# Patient Record
Sex: Male | Born: 1965 | Race: White | Hispanic: No | Smoking: Current every day smoker
Health system: Southern US, Community
[De-identification: ages and names within clinical notes are randomized; demographics above are authoritative.]

---

## 2021-01-05 ENCOUNTER — Inpatient Hospital Stay (HOSPITAL_COMMUNITY)
Admission: EM | Admit: 2021-01-05 | Discharge: 2021-05-14 | DRG: 871 | Disposition: A | Discharge status: Skilled Nursing Facility | Payer: Medicaid Other | Attending: Internal Medicine | Admitting: Internal Medicine

## 2021-01-05 ENCOUNTER — Emergency Department (HOSPITAL_COMMUNITY): Payer: Medicaid Other

## 2021-01-05 ENCOUNTER — Inpatient Hospital Stay (HOSPITAL_COMMUNITY): Payer: Medicaid Other

## 2021-01-05 DIAGNOSIS — E8770 Fluid overload, unspecified: Secondary | ICD-10-CM | POA: Diagnosis present

## 2021-01-05 DIAGNOSIS — E872 Acidosis, unspecified: Secondary | ICD-10-CM | POA: Diagnosis present

## 2021-01-05 DIAGNOSIS — R4587 Impulsiveness: Secondary | ICD-10-CM | POA: Diagnosis present

## 2021-01-05 DIAGNOSIS — R0902 Hypoxemia: Secondary | ICD-10-CM | POA: Diagnosis present

## 2021-01-05 DIAGNOSIS — R609 Edema, unspecified: Secondary | ICD-10-CM | POA: Diagnosis not present

## 2021-01-05 DIAGNOSIS — K7200 Acute and subacute hepatic failure without coma: Secondary | ICD-10-CM | POA: Diagnosis present

## 2021-01-05 DIAGNOSIS — G930 Cerebral cysts: Secondary | ICD-10-CM | POA: Diagnosis present

## 2021-01-05 DIAGNOSIS — M5124 Other intervertebral disc displacement, thoracic region: Secondary | ICD-10-CM | POA: Diagnosis present

## 2021-01-05 DIAGNOSIS — J189 Pneumonia, unspecified organism: Secondary | ICD-10-CM | POA: Diagnosis not present

## 2021-01-05 DIAGNOSIS — K5900 Constipation, unspecified: Secondary | ICD-10-CM | POA: Diagnosis present

## 2021-01-05 DIAGNOSIS — F1027 Alcohol dependence with alcohol-induced persisting dementia: Secondary | ICD-10-CM | POA: Diagnosis present

## 2021-01-05 DIAGNOSIS — Z20822 Contact with and (suspected) exposure to covid-19: Secondary | ICD-10-CM | POA: Diagnosis present

## 2021-01-05 DIAGNOSIS — L989 Disorder of the skin and subcutaneous tissue, unspecified: Secondary | ICD-10-CM

## 2021-01-05 DIAGNOSIS — F419 Anxiety disorder, unspecified: Secondary | ICD-10-CM | POA: Diagnosis present

## 2021-01-05 DIAGNOSIS — G2581 Restless legs syndrome: Secondary | ICD-10-CM | POA: Diagnosis present

## 2021-01-05 DIAGNOSIS — M79672 Pain in left foot: Secondary | ICD-10-CM | POA: Diagnosis not present

## 2021-01-05 DIAGNOSIS — R569 Unspecified convulsions: Secondary | ICD-10-CM | POA: Diagnosis present

## 2021-01-05 DIAGNOSIS — J690 Pneumonitis due to inhalation of food and vomit: Secondary | ICD-10-CM | POA: Diagnosis present

## 2021-01-05 DIAGNOSIS — M6282 Rhabdomyolysis: Secondary | ICD-10-CM | POA: Diagnosis present

## 2021-01-05 DIAGNOSIS — R109 Unspecified abdominal pain: Secondary | ICD-10-CM

## 2021-01-05 DIAGNOSIS — G9349 Other encephalopathy: Secondary | ICD-10-CM | POA: Diagnosis not present

## 2021-01-05 DIAGNOSIS — G5760 Lesion of plantar nerve, unspecified lower limb: Secondary | ICD-10-CM | POA: Diagnosis present

## 2021-01-05 DIAGNOSIS — E512 Wernicke's encephalopathy: Secondary | ICD-10-CM | POA: Diagnosis present

## 2021-01-05 DIAGNOSIS — R6520 Severe sepsis without septic shock: Secondary | ICD-10-CM

## 2021-01-05 DIAGNOSIS — G928 Other toxic encephalopathy: Secondary | ICD-10-CM | POA: Diagnosis present

## 2021-01-05 DIAGNOSIS — D509 Iron deficiency anemia, unspecified: Secondary | ICD-10-CM | POA: Diagnosis not present

## 2021-01-05 DIAGNOSIS — F1097 Alcohol use, unspecified with alcohol-induced persisting dementia: Secondary | ICD-10-CM | POA: Diagnosis not present

## 2021-01-05 DIAGNOSIS — Z8673 Personal history of transient ischemic attack (TIA), and cerebral infarction without residual deficits: Secondary | ICD-10-CM

## 2021-01-05 DIAGNOSIS — Z765 Malingerer [conscious simulation]: Secondary | ICD-10-CM

## 2021-01-05 DIAGNOSIS — Z91199 Patient's noncompliance with other medical treatment and regimen due to unspecified reason: Secondary | ICD-10-CM

## 2021-01-05 DIAGNOSIS — R52 Pain, unspecified: Secondary | ICD-10-CM

## 2021-01-05 DIAGNOSIS — G008 Other bacterial meningitis: Secondary | ICD-10-CM | POA: Diagnosis not present

## 2021-01-05 DIAGNOSIS — I248 Other forms of acute ischemic heart disease: Secondary | ICD-10-CM | POA: Diagnosis present

## 2021-01-05 DIAGNOSIS — R4189 Other symptoms and signs involving cognitive functions and awareness: Secondary | ICD-10-CM | POA: Diagnosis present

## 2021-01-05 DIAGNOSIS — A419 Sepsis, unspecified organism: Secondary | ICD-10-CM | POA: Diagnosis present

## 2021-01-05 DIAGNOSIS — R262 Difficulty in walking, not elsewhere classified: Secondary | ICD-10-CM

## 2021-01-05 DIAGNOSIS — L8990 Pressure ulcer of unspecified site, unspecified stage: Secondary | ICD-10-CM | POA: Diagnosis present

## 2021-01-05 DIAGNOSIS — L309 Dermatitis, unspecified: Secondary | ICD-10-CM | POA: Diagnosis not present

## 2021-01-05 DIAGNOSIS — Z751 Person awaiting admission to adequate facility elsewhere: Secondary | ICD-10-CM

## 2021-01-05 DIAGNOSIS — N179 Acute kidney failure, unspecified: Secondary | ICD-10-CM

## 2021-01-05 DIAGNOSIS — E8721 Acute metabolic acidosis: Secondary | ICD-10-CM | POA: Diagnosis present

## 2021-01-05 DIAGNOSIS — F418 Other specified anxiety disorders: Secondary | ICD-10-CM | POA: Diagnosis not present

## 2021-01-05 DIAGNOSIS — L89891 Pressure ulcer of other site, stage 1: Secondary | ICD-10-CM | POA: Diagnosis present

## 2021-01-05 DIAGNOSIS — Z5900 Homelessness unspecified: Secondary | ICD-10-CM

## 2021-01-05 DIAGNOSIS — F329 Major depressive disorder, single episode, unspecified: Secondary | ICD-10-CM | POA: Diagnosis present

## 2021-01-05 DIAGNOSIS — E860 Dehydration: Secondary | ICD-10-CM | POA: Diagnosis present

## 2021-01-05 DIAGNOSIS — Z6822 Body mass index (BMI) 22.0-22.9, adult: Secondary | ICD-10-CM

## 2021-01-05 DIAGNOSIS — R7401 Elevation of levels of liver transaminase levels: Secondary | ICD-10-CM | POA: Diagnosis not present

## 2021-01-05 DIAGNOSIS — N170 Acute kidney failure with tubular necrosis: Secondary | ICD-10-CM | POA: Diagnosis present

## 2021-01-05 DIAGNOSIS — L509 Urticaria, unspecified: Secondary | ICD-10-CM | POA: Diagnosis present

## 2021-01-05 DIAGNOSIS — L308 Other specified dermatitis: Secondary | ICD-10-CM | POA: Diagnosis not present

## 2021-01-05 DIAGNOSIS — R5381 Other malaise: Secondary | ICD-10-CM | POA: Diagnosis not present

## 2021-01-05 DIAGNOSIS — H9190 Unspecified hearing loss, unspecified ear: Secondary | ICD-10-CM | POA: Diagnosis present

## 2021-01-05 DIAGNOSIS — G629 Polyneuropathy, unspecified: Secondary | ICD-10-CM | POA: Diagnosis present

## 2021-01-05 DIAGNOSIS — I10 Essential (primary) hypertension: Secondary | ICD-10-CM | POA: Diagnosis present

## 2021-01-05 DIAGNOSIS — Z79899 Other long term (current) drug therapy: Secondary | ICD-10-CM

## 2021-01-05 DIAGNOSIS — R9431 Abnormal electrocardiogram [ECG] [EKG]: Secondary | ICD-10-CM | POA: Diagnosis not present

## 2021-01-05 DIAGNOSIS — E440 Moderate protein-calorie malnutrition: Secondary | ICD-10-CM | POA: Diagnosis present

## 2021-01-05 DIAGNOSIS — K56609 Unspecified intestinal obstruction, unspecified as to partial versus complete obstruction: Secondary | ICD-10-CM

## 2021-01-05 DIAGNOSIS — R633 Feeding difficulties, unspecified: Secondary | ICD-10-CM | POA: Diagnosis not present

## 2021-01-05 DIAGNOSIS — F05 Delirium due to known physiological condition: Secondary | ICD-10-CM | POA: Diagnosis present

## 2021-01-05 DIAGNOSIS — Q2112 Patent foramen ovale: Secondary | ICD-10-CM | POA: Diagnosis not present

## 2021-01-05 DIAGNOSIS — G9341 Metabolic encephalopathy: Secondary | ICD-10-CM | POA: Insufficient documentation

## 2021-01-05 DIAGNOSIS — E875 Hyperkalemia: Secondary | ICD-10-CM | POA: Diagnosis present

## 2021-01-05 DIAGNOSIS — L299 Pruritus, unspecified: Secondary | ICD-10-CM | POA: Diagnosis not present

## 2021-01-05 DIAGNOSIS — M25472 Effusion, left ankle: Secondary | ICD-10-CM | POA: Diagnosis present

## 2021-01-05 DIAGNOSIS — R410 Disorientation, unspecified: Secondary | ICD-10-CM

## 2021-01-05 DIAGNOSIS — G4700 Insomnia, unspecified: Secondary | ICD-10-CM | POA: Diagnosis not present

## 2021-01-05 DIAGNOSIS — E876 Hypokalemia: Secondary | ICD-10-CM | POA: Diagnosis not present

## 2021-01-05 DIAGNOSIS — M7989 Other specified soft tissue disorders: Secondary | ICD-10-CM | POA: Diagnosis present

## 2021-01-05 DIAGNOSIS — L03221 Cellulitis of neck: Secondary | ICD-10-CM | POA: Diagnosis not present

## 2021-01-05 DIAGNOSIS — I519 Heart disease, unspecified: Secondary | ICD-10-CM | POA: Diagnosis not present

## 2021-01-05 DIAGNOSIS — N19 Unspecified kidney failure: Secondary | ICD-10-CM | POA: Diagnosis not present

## 2021-01-05 DIAGNOSIS — R823 Hemoglobinuria: Secondary | ICD-10-CM | POA: Diagnosis present

## 2021-01-05 DIAGNOSIS — M109 Gout, unspecified: Secondary | ICD-10-CM | POA: Diagnosis present

## 2021-01-05 DIAGNOSIS — M5125 Other intervertebral disc displacement, thoracolumbar region: Secondary | ICD-10-CM | POA: Diagnosis present

## 2021-01-05 DIAGNOSIS — L209 Atopic dermatitis, unspecified: Secondary | ICD-10-CM | POA: Diagnosis present

## 2021-01-05 DIAGNOSIS — Z23 Encounter for immunization: Secondary | ICD-10-CM | POA: Diagnosis not present

## 2021-01-05 DIAGNOSIS — R310 Gross hematuria: Secondary | ICD-10-CM | POA: Diagnosis not present

## 2021-01-05 DIAGNOSIS — Z781 Physical restraint status: Secondary | ICD-10-CM

## 2021-01-05 DIAGNOSIS — M25562 Pain in left knee: Secondary | ICD-10-CM | POA: Diagnosis present

## 2021-01-05 DIAGNOSIS — I071 Rheumatic tricuspid insufficiency: Secondary | ICD-10-CM | POA: Diagnosis present

## 2021-01-05 DIAGNOSIS — R451 Restlessness and agitation: Secondary | ICD-10-CM | POA: Diagnosis not present

## 2021-01-05 DIAGNOSIS — D649 Anemia, unspecified: Secondary | ICD-10-CM | POA: Diagnosis present

## 2021-01-05 DIAGNOSIS — R509 Fever, unspecified: Secondary | ICD-10-CM | POA: Diagnosis not present

## 2021-01-05 DIAGNOSIS — M79662 Pain in left lower leg: Secondary | ICD-10-CM | POA: Diagnosis not present

## 2021-01-05 DIAGNOSIS — F17210 Nicotine dependence, cigarettes, uncomplicated: Secondary | ICD-10-CM | POA: Diagnosis present

## 2021-01-05 DIAGNOSIS — E46 Unspecified protein-calorie malnutrition: Secondary | ICD-10-CM

## 2021-01-05 DIAGNOSIS — I6389 Other cerebral infarction: Secondary | ICD-10-CM | POA: Diagnosis present

## 2021-01-05 DIAGNOSIS — F1096 Alcohol use, unspecified with alcohol-induced persisting amnestic disorder: Secondary | ICD-10-CM | POA: Diagnosis not present

## 2021-01-05 DIAGNOSIS — M25572 Pain in left ankle and joints of left foot: Secondary | ICD-10-CM | POA: Diagnosis present

## 2021-01-05 DIAGNOSIS — H539 Unspecified visual disturbance: Secondary | ICD-10-CM | POA: Diagnosis present

## 2021-01-05 DIAGNOSIS — G259 Extrapyramidal and movement disorder, unspecified: Secondary | ICD-10-CM | POA: Diagnosis present

## 2021-01-05 DIAGNOSIS — I824Y9 Acute embolism and thrombosis of unspecified deep veins of unspecified proximal lower extremity: Secondary | ICD-10-CM | POA: Diagnosis not present

## 2021-01-05 DIAGNOSIS — F940 Selective mutism: Secondary | ICD-10-CM | POA: Diagnosis not present

## 2021-01-05 DIAGNOSIS — L0211 Cutaneous abscess of neck: Secondary | ICD-10-CM | POA: Diagnosis not present

## 2021-01-05 DIAGNOSIS — I82409 Acute embolism and thrombosis of unspecified deep veins of unspecified lower extremity: Secondary | ICD-10-CM

## 2021-01-05 DIAGNOSIS — Z452 Encounter for adjustment and management of vascular access device: Secondary | ICD-10-CM

## 2021-01-05 LAB — CBC
HCT: 46.8 % (ref 39.0–52.0)
HCT: 41.1 % (ref 39.0–52.0)
Hemoglobin: 15.1 g/dL (ref 13.0–17.0)
Hemoglobin: 13.6 g/dL (ref 13.0–17.0)
MCH: 32.2 pg (ref 26.0–34.0)
MCH: 32.9 pg (ref 26.0–34.0)
MCHC: 32.3 g/dL (ref 30.0–36.0)
MCHC: 33.1 g/dL (ref 30.0–36.0)
MCV: 99.8 fL (ref 80.0–100.0)
MCV: 99.3 fL (ref 80.0–100.0)
Platelets: 382 10*3/uL (ref 150–400)
Platelets: 321 10*3/uL (ref 150–400)
RBC: 4.69 MIL/uL (ref 4.22–5.81)
RBC: 4.14 MIL/uL — ABNORMAL LOW (ref 4.22–5.81)
RDW: 15.3 % (ref 11.5–15.5)
RDW: 15.2 % (ref 11.5–15.5)
WBC: 23.7 10*3/uL — ABNORMAL HIGH (ref 4.0–10.5)
WBC: 20.3 10*3/uL — ABNORMAL HIGH (ref 4.0–10.5)
nRBC: 0 % (ref 0.0–0.2)
nRBC: 0 % (ref 0.0–0.2)

## 2021-01-05 LAB — GLUCOSE, CAPILLARY
Glucose-Capillary: 131 mg/dL — ABNORMAL HIGH (ref 70–99)
Glucose-Capillary: 119 mg/dL — ABNORMAL HIGH (ref 70–99)
Glucose-Capillary: 136 mg/dL — ABNORMAL HIGH (ref 70–99)

## 2021-01-05 LAB — URINALYSIS, ROUTINE W REFLEX MICROSCOPIC
Bilirubin Urine: NEGATIVE
Glucose, UA: NEGATIVE mg/dL
Ketones, ur: NEGATIVE mg/dL
Leukocytes,Ua: NEGATIVE
Nitrite: NEGATIVE
Protein, ur: 100 mg/dL — AB
Specific Gravity, Urine: 1.01 (ref 1.005–1.030)
pH: 5 (ref 5.0–8.0)

## 2021-01-05 LAB — RAPID URINE DRUG SCREEN, HOSP PERFORMED
Amphetamines: NOT DETECTED
Barbiturates: NOT DETECTED
Benzodiazepines: POSITIVE — AB
Cocaine: NOT DETECTED
Opiates: NOT DETECTED
Tetrahydrocannabinol: NOT DETECTED

## 2021-01-05 LAB — COMPREHENSIVE METABOLIC PANEL
ALT: 893 U/L — ABNORMAL HIGH (ref 0–44)
AST: 1267 U/L — ABNORMAL HIGH (ref 15–41)
Albumin: 4.1 g/dL (ref 3.5–5.0)
Alkaline Phosphatase: 90 U/L (ref 38–126)
Anion gap: 19 — ABNORMAL HIGH (ref 5–15)
BUN: 32 mg/dL — ABNORMAL HIGH (ref 6–20)
CO2: 19 mmol/L — ABNORMAL LOW (ref 22–32)
Calcium: 8 mg/dL — ABNORMAL LOW (ref 8.9–10.3)
Chloride: 103 mmol/L (ref 98–111)
Creatinine, Ser: 3.27 mg/dL — ABNORMAL HIGH (ref 0.61–1.24)
GFR, Estimated: 21 mL/min — ABNORMAL LOW (ref >60)
Glucose, Bld: 122 mg/dL — ABNORMAL HIGH (ref 70–99)
Potassium: 4.8 mmol/L (ref 3.5–5.1)
Sodium: 141 mmol/L (ref 135–145)
Total Bilirubin: 1 mg/dL (ref 0.3–1.2)
Total Protein: 8 g/dL (ref 6.5–8.1)

## 2021-01-05 LAB — CBG MONITORING, ED: Glucose-Capillary: 87 mg/dL (ref 70–99)

## 2021-01-05 LAB — CREATININE, SERUM
Creatinine, Ser: 3.04 mg/dL — ABNORMAL HIGH (ref 0.61–1.24)
GFR, Estimated: 23 mL/min — ABNORMAL LOW (ref >60)

## 2021-01-05 LAB — RESP PANEL BY RT-PCR (FLU A&B, COVID) ARPGX2
Influenza A by PCR: NEGATIVE
Influenza B by PCR: NEGATIVE
SARS Coronavirus 2 by RT PCR: NEGATIVE

## 2021-01-05 LAB — APTT: aPTT: 27 seconds (ref 24–36)

## 2021-01-05 LAB — CK: Total CK: >50000 U/L — ABNORMAL HIGH (ref 49–397)

## 2021-01-05 LAB — HIV ANTIBODY (ROUTINE TESTING W REFLEX): HIV Screen 4th Generation wRfx: NONREACTIVE

## 2021-01-05 LAB — LACTIC ACID, PLASMA
Lactic Acid, Venous: 7.6 mmol/L (ref 0.5–1.9)
Lactic Acid, Venous: 2.8 mmol/L (ref 0.5–1.9)
Lactic Acid, Venous: 4 mmol/L (ref 0.5–1.9)
Lactic Acid, Venous: 2.4 mmol/L (ref 0.5–1.9)

## 2021-01-05 LAB — MRSA NEXT GEN BY PCR, NASAL: MRSA by PCR Next Gen: NOT DETECTED

## 2021-01-05 LAB — PROTIME-INR
INR: 1.2 (ref 0.8–1.2)
Prothrombin Time: 15.5 seconds — ABNORMAL HIGH (ref 11.4–15.2)

## 2021-01-05 LAB — TROPONIN I (HIGH SENSITIVITY)
Troponin I (High Sensitivity): 1823 ng/L (ref <18)
Troponin I (High Sensitivity): 2359 ng/L (ref <18)

## 2021-01-05 LAB — ETHANOL: Alcohol, Ethyl (B): <10 mg/dL (ref <10)

## 2021-01-05 IMAGING — DX DG CHEST 1V PORT
1 series · 1 of 1 positions shown · non-contrast
Comparison: None.

CLINICAL DATA: Questionable sepsis

EXAM:
PORTABLE CHEST 1 VIEW

[chest]
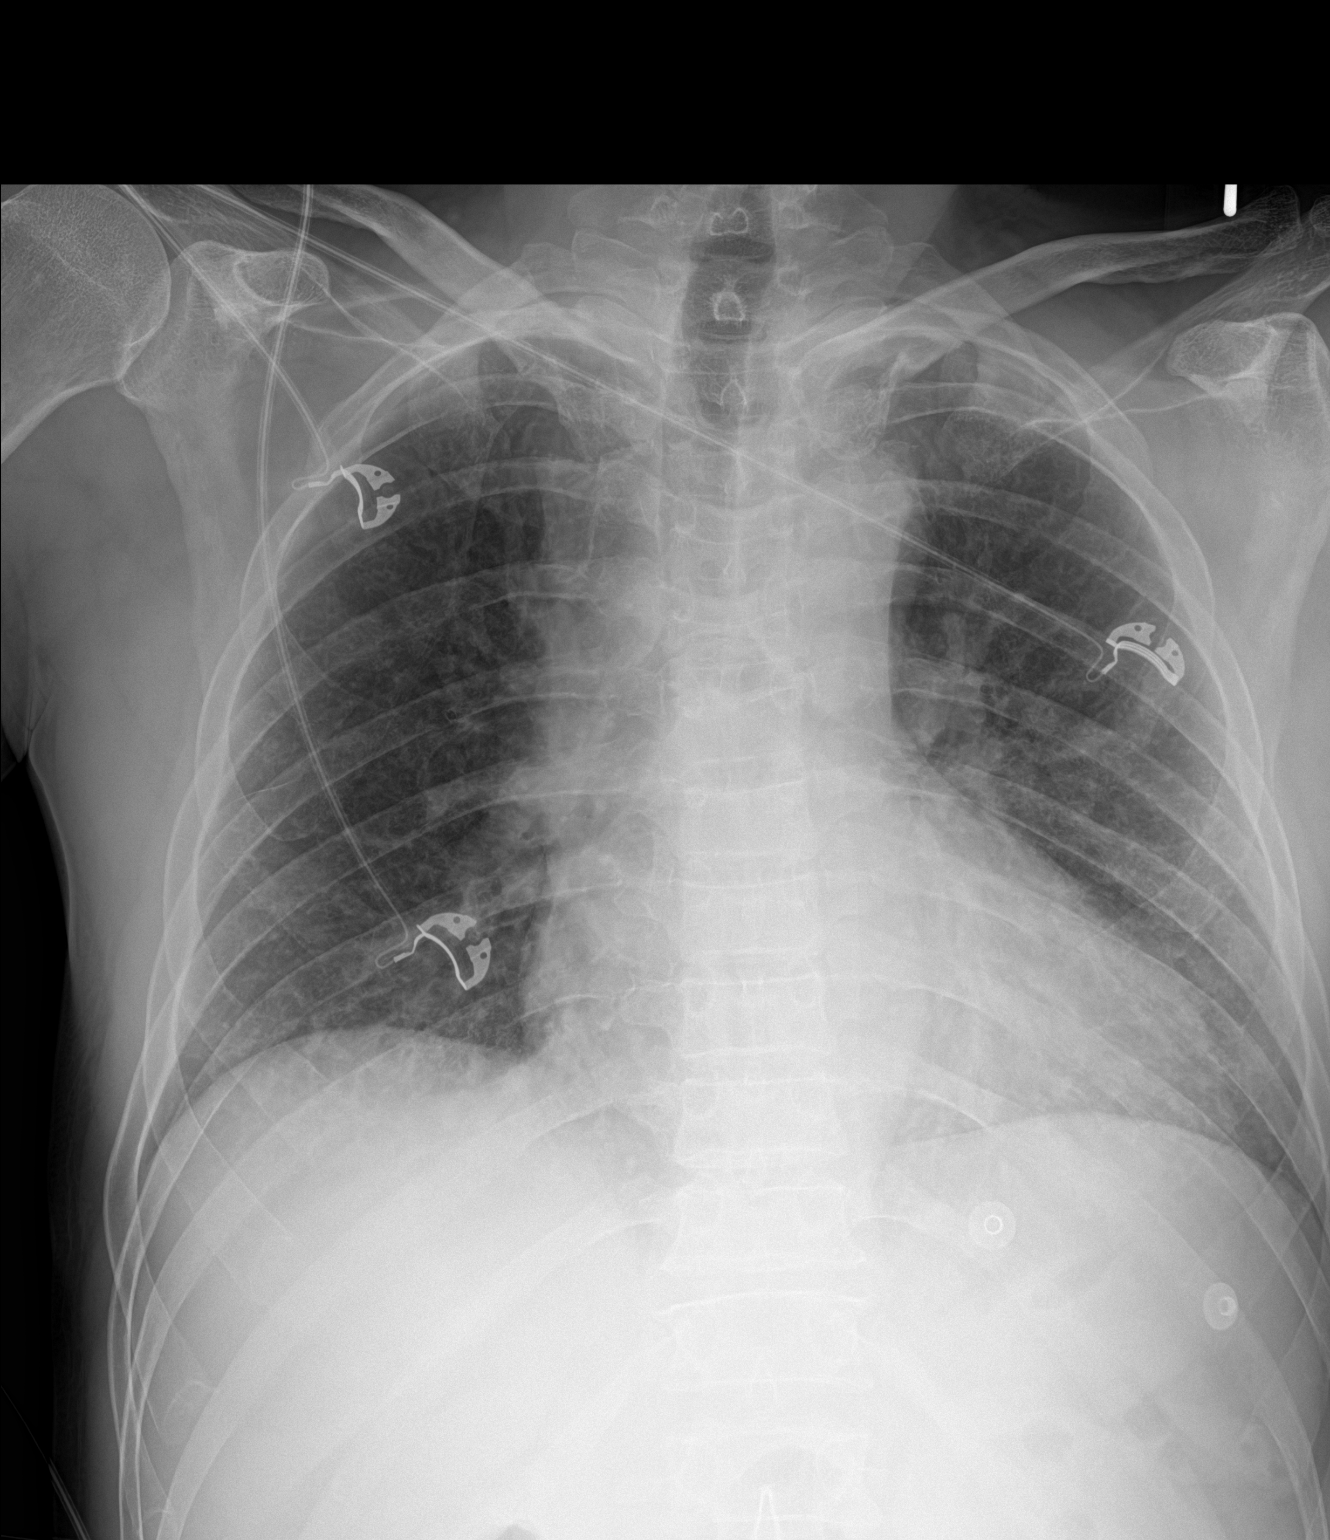

[1 of 1 positions shown; findings below may reference images not displayed]

FINDINGS: Normal heart size. Normal mediastinal contour. No pneumothorax. No
pleural effusion. No overt pulmonary edema. Mild hazy bibasilar lung
opacities.
IMPRESSION: Mild hazy bibasilar lung opacities, which could represent aspiration
or atelectasis.

## 2021-01-05 IMAGING — CT CT HEAD W/O CM
3 series · 15 of 47 positions shown, 18 images · non-contrast
Comparison: None.

CLINICAL DATA: Mental status change, unknown cause

EXAM:
CT HEAD WITHOUT CONTRAST
TECHNIQUE: Contiguous axial images were obtained from the base of the skull
through the vertex without intravenous contrast.

[Series 3: head 5.0 h30s · axial · 0.45mm/px · z∈[-176,-26]mm · 9 of 36 slices shown, 12 images]
[im 3/36  brain]
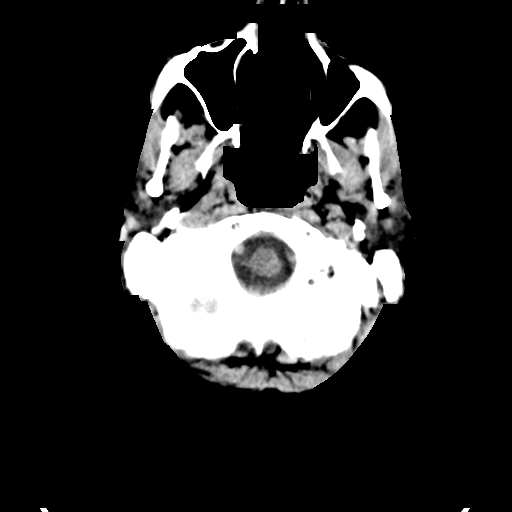
[im 3/36  bone]
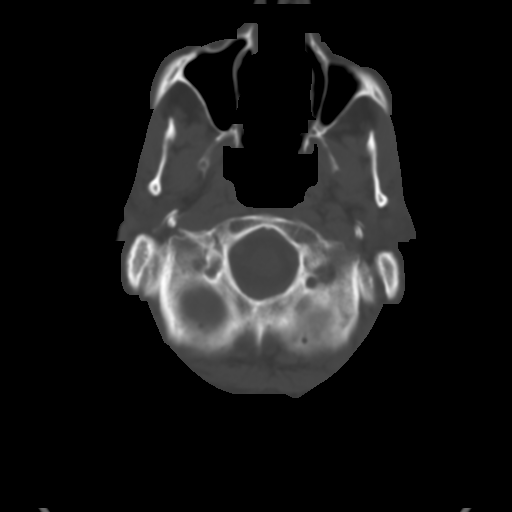
[im 7/36  brain]
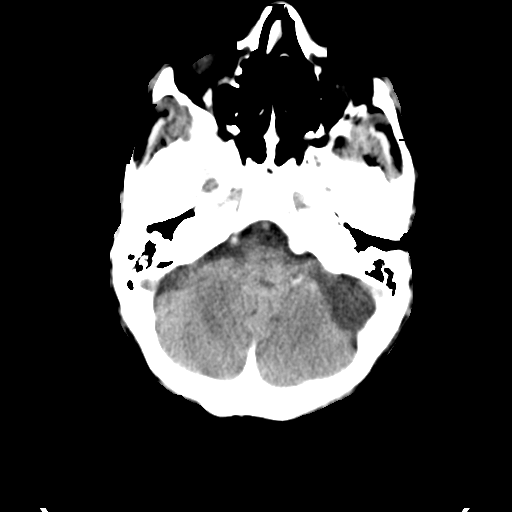
[im 10/36  brain]
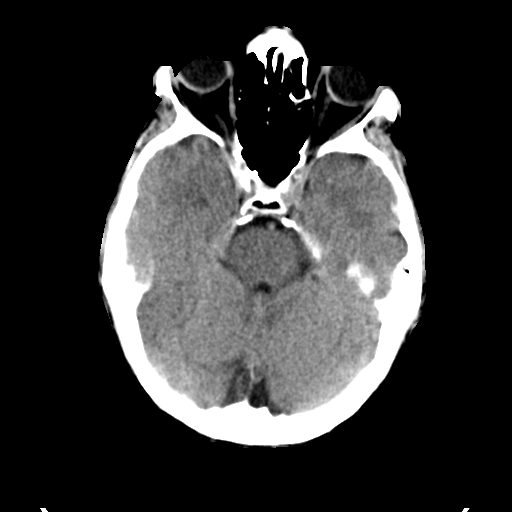
[im 14/36  brain]
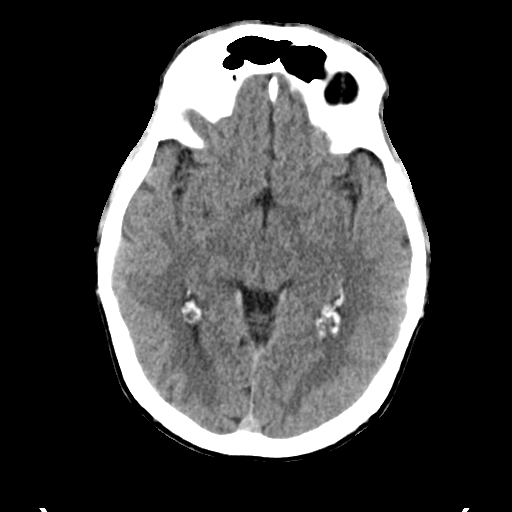
[im 19/36  brain]
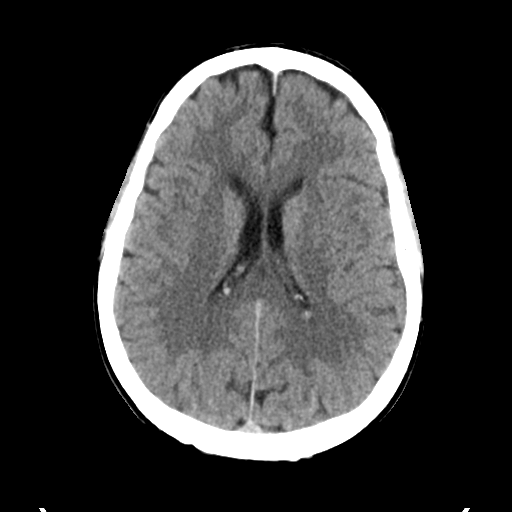
[im 19/36  bone]
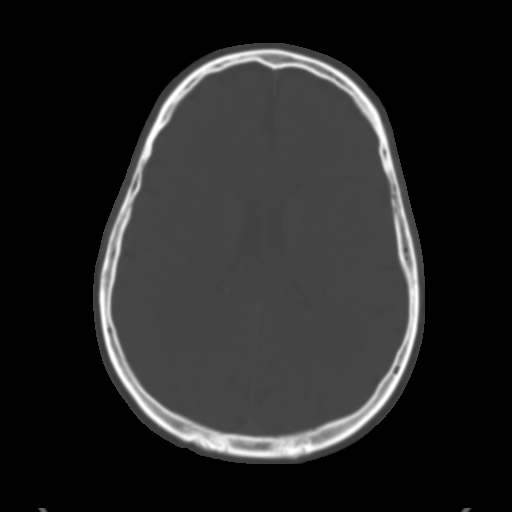
[im 22/36  brain]
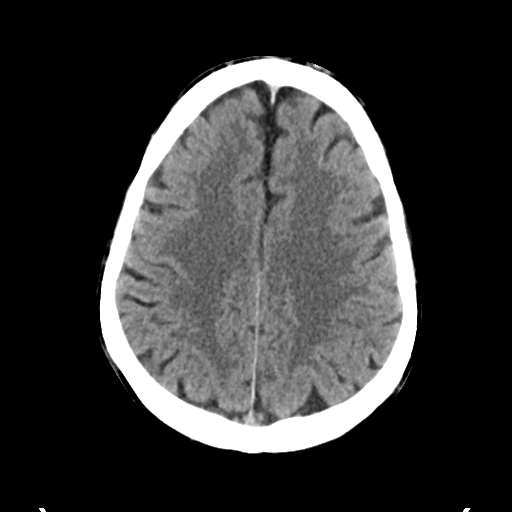
[im 26/36  brain]
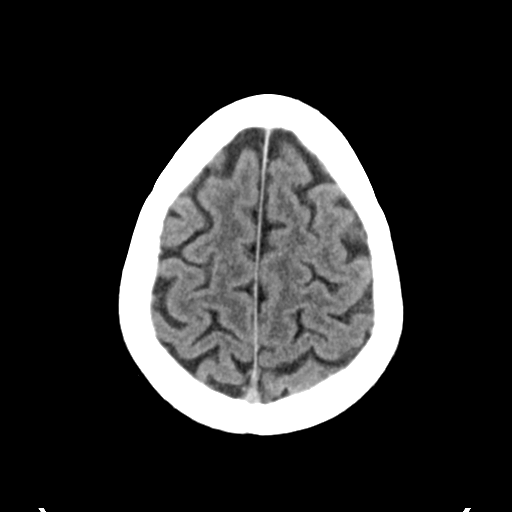
[im 29/36  brain]
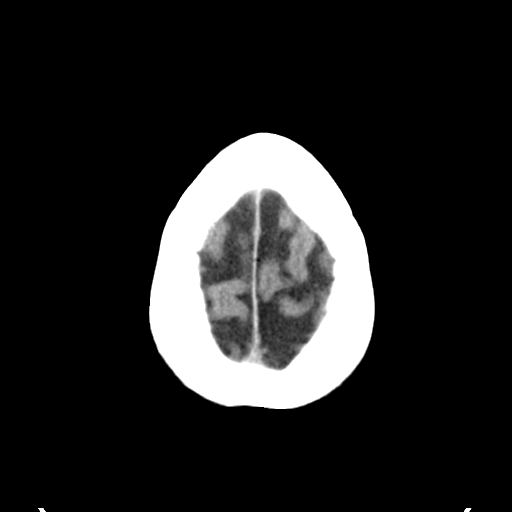
[im 33/36  brain]
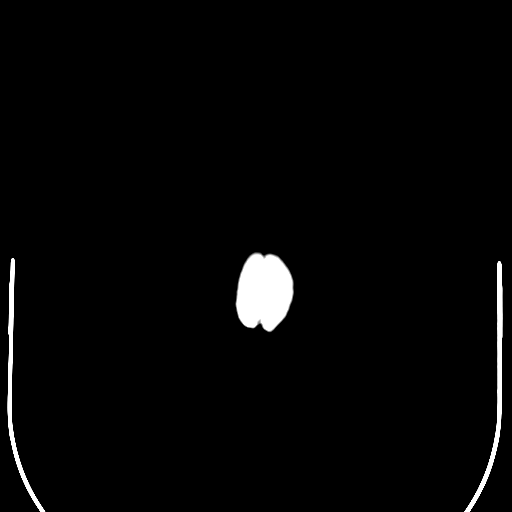
[im 33/36  bone]
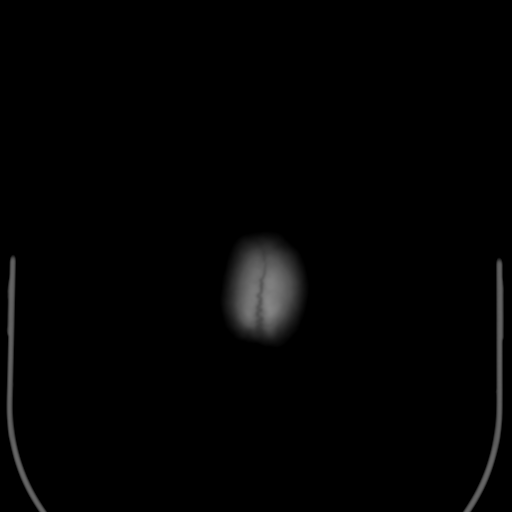

[Series 5: head 3.0 mpr cor · coronal · 0.37mm/px · 3 of 70 slices shown]
[im 24/70  brain]
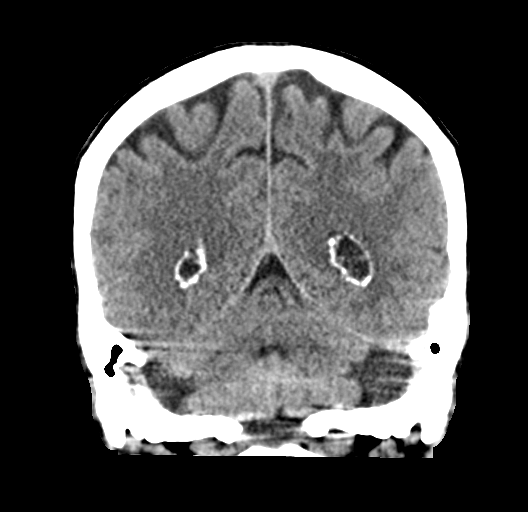
[im 31/70  brain]
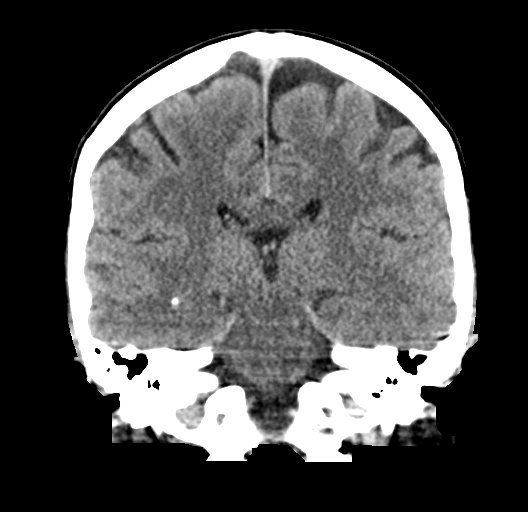
[im 39/70  brain]
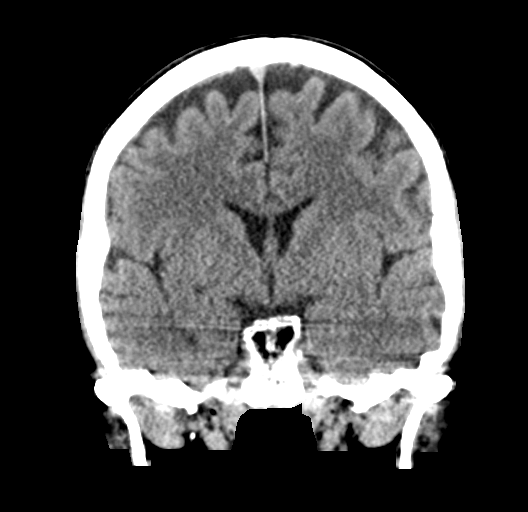

[Series 6: head 3.0 mpr sag · sagittal · 0.34mm/px · 3 of 62 slices shown]
[im 21/62  brain]
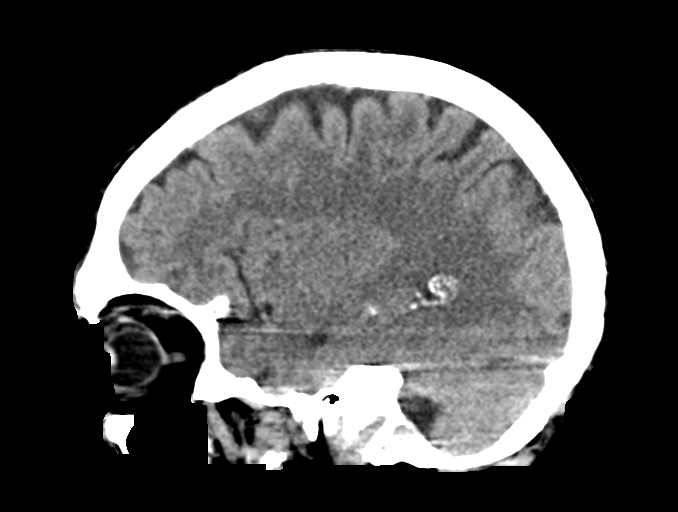
[im 31/62  brain]
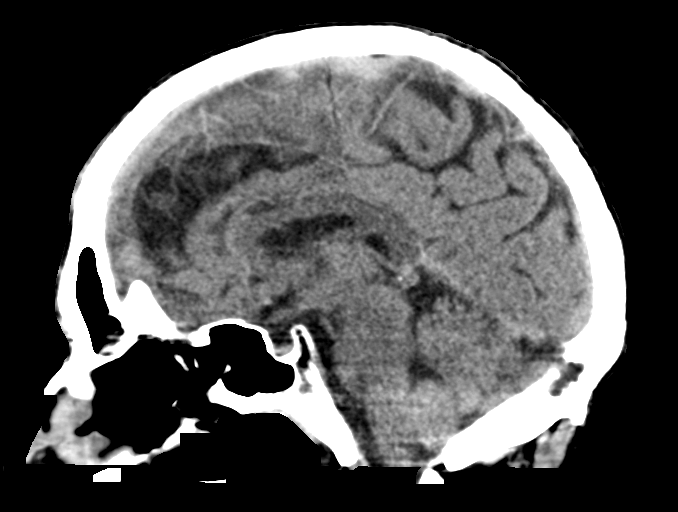
[im 41/62  brain]
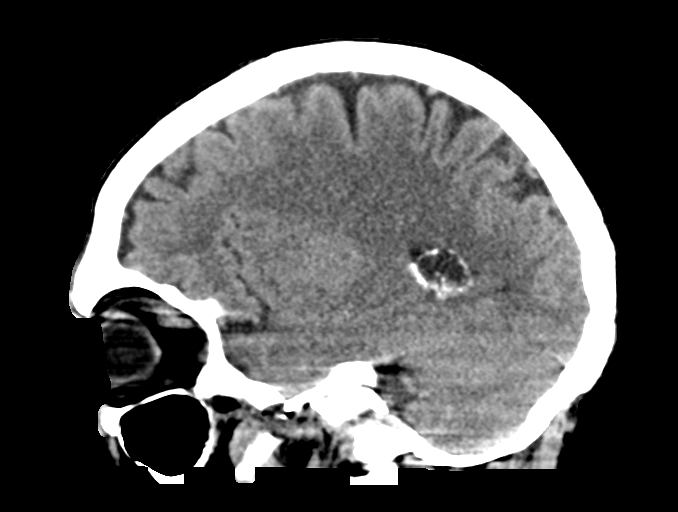

[15 of 47 positions shown; findings below may reference images not displayed]

FINDINGS: Brain: No evidence of acute infarction, hemorrhage, or
hydrocephalus. Prominent fluid space at the left cerebellopontine
angle measuring approximately 3 x 2 x 2 cm which appears to have
mild mass effect upon the adjacent cerebellar hemisphere. Space
appears to have internal density near that of CSF, although
evaluation is slightly limited given artifact from the adjacent
skull base.

Vascular: No hyperdense vessel or unexpected calcification.

Skull: Normal. Negative for fracture or focal lesion.

Sinuses/Orbits: No acute finding.

Other: None.
IMPRESSION: 1. No acute intracranial findings.
2. Prominent fluid space at the left cerebellopontine angle, likely
representing an arachnoid cyst or possibly epidermoid cyst.
Correlation with prior outside imaging of the brain is recommended
to assess stability. If no prior imaging is available, a follow-up
contrast enhanced MRI could be performed on a nonemergent basis.

## 2021-01-05 MED ORDER — SODIUM CHLORIDE 0.9 % IV SOLN: 1250.0000 mg | Freq: Two times a day (BID) | INTRAVENOUS | Status: DC

## 2021-01-05 MED ORDER — HEPARIN SODIUM (PORCINE) 5000 UNIT/ML IJ SOLN
5000.0000 [IU] | Freq: Three times a day (TID) | INTRAMUSCULAR | Status: DC
  Administered 2021-01-05 – 2021-01-13 (×25): 5000 [IU] via SUBCUTANEOUS
  Filled 2021-01-05 (×25): qty 1

## 2021-01-05 MED ORDER — FENTANYL CITRATE (PF) 100 MCG/2ML IJ SOLN
12.5000 ug | Freq: Once | INTRAMUSCULAR | Status: AC
  Administered 2021-01-05: 12.5 ug via INTRAVENOUS
  Filled 2021-01-05: qty 2

## 2021-01-05 MED ORDER — POLYETHYLENE GLYCOL 3350 17 G PO PACK
17.0000 g | PACK | Freq: Every day | ORAL | Status: DC | PRN
  Administered 2021-01-29 – 2021-02-28 (×2): 17 g via ORAL
  Filled 2021-01-05: qty 1

## 2021-01-05 MED ORDER — SODIUM CHLORIDE 0.9 % IV SOLN
20.0000 mg/kg | Freq: Two times a day (BID) | INTRAVENOUS | Status: DC
  Filled 2021-01-05: qty 13.5

## 2021-01-05 MED ORDER — SODIUM CHLORIDE 0.9 % IV BOLUS
1000.0000 mL | Freq: Once | INTRAVENOUS | Status: AC
  Administered 2021-01-05: 14:00:00 1000 mL via INTRAVENOUS

## 2021-01-05 MED ORDER — SODIUM CHLORIDE 0.9 % IV SOLN
2000.0000 mg | Freq: Once | INTRAVENOUS | Status: AC
  Administered 2021-01-05: 18:00:00 2000 mg via INTRAVENOUS
  Filled 2021-01-05: qty 20

## 2021-01-05 MED ORDER — MIDAZOLAM HCL 2 MG/2ML IJ SOLN
INTRAMUSCULAR | Status: AC
  Filled 2021-01-05: qty 6

## 2021-01-05 MED ORDER — DIPHENHYDRAMINE HCL 50 MG/ML IJ SOLN
12.5000 mg | Freq: Four times a day (QID) | INTRAMUSCULAR | Status: DC | PRN
  Administered 2021-01-05 – 2021-01-14 (×4): 12.5 mg via INTRAVENOUS
  Filled 2021-01-05 (×4): qty 1

## 2021-01-05 MED ORDER — VANCOMYCIN HCL IN DEXTROSE 1-5 GM/200ML-% IV SOLN
1000.0000 mg | Freq: Once | INTRAVENOUS | Status: AC
  Administered 2021-01-05: 16:00:00 1000 mg via INTRAVENOUS
  Filled 2021-01-05: qty 200

## 2021-01-05 MED ORDER — LACTATED RINGERS IV BOLUS
1000.0000 mL | Freq: Once | INTRAVENOUS | Status: AC
  Administered 2021-01-05: 16:00:00 1000 mL via INTRAVENOUS

## 2021-01-05 MED ORDER — DOCUSATE SODIUM 100 MG PO CAPS
100.0000 mg | ORAL_CAPSULE | Freq: Two times a day (BID) | ORAL | Status: DC | PRN
  Administered 2021-01-13 – 2021-03-02 (×2): 100 mg via ORAL
  Filled 2021-01-05: qty 1

## 2021-01-05 MED ORDER — SODIUM CHLORIDE 0.9 % IV SOLN
3.0000 g | Freq: Two times a day (BID) | INTRAVENOUS | Status: DC
  Administered 2021-01-05 – 2021-01-06 (×2): 3 g via INTRAVENOUS
  Filled 2021-01-05 (×2): qty 8

## 2021-01-05 MED ORDER — LACTATED RINGERS IV SOLN
INTRAVENOUS | Status: DC
Start: 2021-01-05 — End: 2021-01-08

## 2021-01-05 MED ORDER — MIDAZOLAM HCL 5 MG/5ML IJ SOLN
INTRAMUSCULAR | Status: DC | PRN
  Administered 2021-01-05: 5 mg via INTRAVENOUS

## 2021-01-05 MED ORDER — LEVETIRACETAM IN NACL 500 MG/100ML IV SOLN
500.0000 mg | Freq: Two times a day (BID) | INTRAVENOUS | Status: DC
  Filled 2021-01-05: qty 100

## 2021-01-05 MED ORDER — SODIUM CHLORIDE 0.9 % IV BOLUS
1000.0000 mL | Freq: Once | INTRAVENOUS | Status: AC
  Administered 2021-01-05: 15:00:00 1000 mL via INTRAVENOUS

## 2021-01-05 MED ORDER — LORAZEPAM 2 MG/ML IJ SOLN
1.0000 mg | Freq: Once | INTRAMUSCULAR | Status: AC
  Administered 2021-01-05: 17:00:00 1 mg via INTRAVENOUS
  Filled 2021-01-05: qty 1

## 2021-01-05 MED ORDER — SODIUM CHLORIDE 0.9 % IV SOLN
2.0000 g | Freq: Once | INTRAVENOUS | Status: AC
  Administered 2021-01-05: 16:00:00 2 g via INTRAVENOUS
  Filled 2021-01-05: qty 2

## 2021-01-05 MED ORDER — SODIUM CHLORIDE 0.9 % IV SOLN
20.0000 mg/kg | Freq: Two times a day (BID) | INTRAVENOUS | Status: DC
  Filled 2021-01-05 (×2): qty 13.5

## 2021-01-05 MED ORDER — LORAZEPAM 2 MG/ML IJ SOLN
1.0000 mg | Freq: Once | INTRAMUSCULAR | Status: AC
  Administered 2021-01-05: 18:00:00 1 mg via INTRAVENOUS

## 2021-01-05 MED ORDER — VANCOMYCIN VARIABLE DOSE PER UNSTABLE RENAL FUNCTION (PHARMACIST DOSING): Status: DC

## 2021-01-05 MED ORDER — LEVETIRACETAM IN NACL 500 MG/100ML IV SOLN
500.0000 mg | Freq: Two times a day (BID) | INTRAVENOUS | Status: DC
  Administered 2021-01-06 – 2021-01-08 (×5): 500 mg via INTRAVENOUS
  Filled 2021-01-05 (×5): qty 100

## 2021-01-05 MED ORDER — SODIUM CHLORIDE 0.9 % IV SOLN: 2.0000 g | INTRAVENOUS | Status: DC

## 2021-01-05 MED ORDER — DEXMEDETOMIDINE HCL IN NACL 400 MCG/100ML IV SOLN
0.4000 ug/kg/h | INTRAVENOUS | Status: DC
  Administered 2021-01-05: 17:00:00 1 ug/kg/h via INTRAVENOUS
  Administered 2021-01-06: 10:00:00 0.8 ug/kg/h via INTRAVENOUS
  Administered 2021-01-06 (×2): 0.9 ug/kg/h via INTRAVENOUS
  Administered 2021-01-06: 01:00:00 0.7 ug/kg/h via INTRAVENOUS
  Administered 2021-01-07: 06:00:00 1 ug/kg/h via INTRAVENOUS
  Administered 2021-01-07 (×3): 1.2 ug/kg/h via INTRAVENOUS
  Administered 2021-01-08: 03:00:00 0.8 ug/kg/h via INTRAVENOUS
  Administered 2021-01-08: 21:00:00 0.4 ug/kg/h via INTRAVENOUS
  Administered 2021-01-09: 04:00:00 0.9 ug/kg/h via INTRAVENOUS
  Administered 2021-01-09: 20:00:00 0.6 ug/kg/h via INTRAVENOUS
  Administered 2021-01-10: 20:00:00 0.7 ug/kg/h via INTRAVENOUS
  Administered 2021-01-10: 03:00:00 0.8 ug/kg/h via INTRAVENOUS
  Administered 2021-01-10 – 2021-01-11 (×2): 0.7 ug/kg/h via INTRAVENOUS
  Administered 2021-01-11 – 2021-01-12 (×3): 0.6 ug/kg/h via INTRAVENOUS
  Administered 2021-01-13: 10:00:00 1 ug/kg/h via INTRAVENOUS
  Administered 2021-01-13: 06:00:00 1.4 ug/kg/h via INTRAVENOUS
  Administered 2021-01-13: 16:00:00 0.9 ug/kg/h via INTRAVENOUS
  Administered 2021-01-13: 0.6 ug/kg/h via INTRAVENOUS
  Administered 2021-01-14: 08:00:00 1.1 ug/kg/h via INTRAVENOUS
  Administered 2021-01-14: 04:00:00 1.3 ug/kg/h via INTRAVENOUS
  Administered 2021-01-14: 19:00:00 0.6 ug/kg/h via INTRAVENOUS
  Administered 2021-01-15: 02:00:00 1 ug/kg/h via INTRAVENOUS
  Filled 2021-01-05 (×17): qty 100
  Filled 2021-01-05: qty 200
  Filled 2021-01-05 (×9): qty 100

## 2021-01-05 NOTE — H&P (Addendum)
NAME:  Steve [name], MRN:  [id number], DOB:  Oct 25, 1965, LOS: 0 ADMISSION DATE:  01/05/2021, CONSULTATION DATE:  12/20 REFERRING MD:  12/20, CHIEF COMPLAINT:  sepsis and AMS   History of Present Illness:  This is a 55 year old white male with limited medical history on presentation to the emergency room.  He was brought from EMS after being found at a group home.  Reportedly his roommate found him having full body convulsions, followed by altered sensorium and combativeness.  Per report from EMS roommate had been noticing the patient was not acting typical for the prior few days before. On arrival to ER was found to be slightly hypotensive with systolic blood pressure in the 90s, room air saturations low 90s, he was confused, and at times combative.  A CT head was obtained this was negative for acute bleeding but did show prominent fluid space at the left cerebral pontine angle raising possibility of epidermoid or arachnoid cyst. Initial lab work: Serum creatinine of 3.27, abnormal LFTs, white blood cell count 23.7, and lactic acid 7.6.  Blood cultures were sent, empiric antibiotic started, IV fluid resuscitation administered.  Because of multiple metabolic derangements, altered sensorium, and concern about seizure the critical care team was asked to admit.  Pertinent  Medical History  Formal history not available on time of admission.  Home medications notable for Prozac, Neurontin, hydroxyzine and trazodone Significant Hospital Events: Including procedures, antibiotic start and stop dates in addition to other pertinent events     Interim History / Subjective:  Mental status slowly improving  Objective   Blood pressure (Abnormal) 102/58, pulse 96, temperature (Abnormal) 100.8 F (38.2 C), temperature source Rectal, resp. rate (Abnormal) 23, SpO2 96 %.        Intake/Output Summary (Last 24 hours) at 01/05/2021 1534 Last data filed at 01/05/2021 1432 Gross per 24 hour  Intake 1000 ml   Output no documentation  Net 1000 ml   There were no vitals filed for this visit.  Examination: General: 55 year old white male easily arousable, follows commands.  No focal motor deficits and currently in no distress but very hard of hearing HENT: Normocephalic atraumatic however does have bruising on his tongue consistent with having bitten it.  Pupils equal reactive seems very hard of hearing Lungs: Clear to auscultation no accessory use Cardiovascular: Tachycardic regular rhythm Abdomen: Soft not tender Extremities: Warm dry with multiple abrasions on the left lower extremity Neuro: Awake oriented x2 very hard of hearing moves all extremities, there is no nuchal rigidity GU: Due to void,  Resolved Hospital Problem list     Assessment & Plan:  Acute metabolic encephalopathy.  Highest on differential diagnosis at this point would be seizure with postictal state, cannot exclude infection but suspect this would be secondary.  Polysubstance abuse history unknown Plan Follow UDS  loading with Keppra EEG Serial neuro checks ICU admit Neuro consult. 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   Plan May need further MRI imaging Not clear if this could be contributing to seizure, seems unlikely  Lactic acidosis with anion gap metabolic acidosis -More inclined to think secondary to seizure -May be element of sepsis Plan Repeating third liter of LR Repeat lactate  Severe sepsis secondary to aspiration pneumonia Plan Continuing IV fluid challenge Admit to ICU Repeat post resuscitation lactate Blood culture sent IV vancomycin and cefepime A.m. CBC  Aspiration pneumonia Plan Antibiotics as above Supplemental oxygen Pulse oximetry N.p.o.  AKI Plan IV  hydration Renal dose meds Strict intake output Serial chemistries  Urticaria He is constantly scratching at LLE Plan Add low dose benadryl     Best  Practice (right click and "Reselect all SmartList Selections" daily)   Diet/type: NPO DVT prophylaxis: prophylactic heparin  GI prophylaxis: N/A Lines: N/A Foley:  N/A Code Status:  full code Last date of multidisciplinary goals of care discussion [pending ]  Labs   CBC: Recent Labs  Lab 01/05/21 1342  WBC 23.7*  HGB 15.1  HCT 46.8  MCV 99.8  PLT 382    Basic Metabolic Panel: No results for input(s): NA, K, CL, CO2, GLUCOSE, BUN, CREATININE, CALCIUM, MG, PHOS in the last 168 hours. GFR: CrCl cannot be calculated (No successful lab value found.). Recent Labs  Lab 01/05/21 1342 01/05/21 1441  WBC 23.7*  --   LATICACIDVEN  --  7.6*    Liver Function Tests: No results for input(s): AST, ALT, ALKPHOS, BILITOT, PROT, ALBUMIN in the last 168 hours. No results for input(s): LIPASE, AMYLASE in the last 168 hours. No results for input(s): AMMONIA in the last 168 hours.  ABG No results found for: PHART, PCO2ART, PO2ART, HCO3, TCO2, ACIDBASEDEF, O2SAT   Coagulation Profile: Recent Labs  Lab 01/05/21 1342  INR 1.2    Cardiac Enzymes: No results for input(s): CKTOTAL, CKMB, CKMBINDEX, TROPONINI in the last 168 hours.  HbA1C: No results found for: HGBA1C  CBG: Recent Labs  Lab 01/05/21 1414  GLUCAP 87    Review of Systems:   Unable to obtain ROS  Past Medical History:  He,  has no past medical history on file.  On record Surgical History:  Not able to provide   Social History:     Group home  Family History:  His family history is not on file.   Allergies Not on File   Home Medications  Prior to Admission medications   Medication Sig Start Date End Date Taking? Authorizing Provider  FLUoxetine (PROZAC) 20 MG capsule Take 40 mg by mouth daily. 12/27/20   [provider]  gabapentin (NEURONTIN) 400 MG capsule Take 400 mg by mouth 3 (three) times daily. 11/27/20   [provider]  hydrOXYzine (ATARAX) 50 MG tablet Take 50 mg by  mouth every 4 (four) hours as needed. 11/27/20   [provider]  traZODone (DESYREL) 50 MG tablet Take 50 mg by mouth at bedtime as needed. 11/13/20   [provider]     Critical care time: 32 min    Simonne Martinet ACNP-BC [facility] Pulmonary/Critical Care Pager # [phone] OR # [phone] if no answer

## 2021-01-05 NOTE — Progress Notes (Deleted)
EEG done at bedside. No skin breakdown seen. Results pending.

## 2021-01-05 NOTE — Progress Notes (Addendum)
EEG unable to be preformed due patient moving around constantly. Will try back at a later time.

## 2021-01-05 NOTE — Plan of Care (Signed)

## 2021-01-05 NOTE — Progress Notes (Signed)
Pharmacy Antibiotic Note  Steve [name] is a 55 y.o. male admitted on 01/05/2021 with pneumonia.  Pharmacy has been consulted for unasyn dosing.  Plan: Unasyn 3 grams iv q12h  Weight: 67.5 kg (148 lb 13 oz)  Temp (24hrs), Avg:100.8 F (38.2 C), Min:100.8 F (38.2 C), Max:100.8 F (38.2 C)  Recent Labs  Lab 01/05/21 1342 01/05/21 1441 01/05/21 1538  WBC 23.7*  --  20.3*  CREATININE 3.27*  --  3.04*  LATICACIDVEN  --  7.6* 2.8*    CrCl cannot be calculated (Unknown ideal weight.).    Not on File  Antimicrobials this admission: 12/20 cefepime >> 12/20 12/20 vancomycin >> 12/20  Dose adjustments this admission: Unasyn for renal function  Microbiology results: 12/20 BCx: in progress 12/20 UCx: w/ reflex in progress    Thank you for allowing pharmacy to be a part of this patients care.  Greta Doom BS, PharmD, BCPS Clinical Pharmacist 01/05/2021 5:40 PM

## 2021-01-05 NOTE — ED Provider Notes (Signed)
[facility] EMERGENCY DEPARTMENT Provider Note   CSN: [id number] Arrival date & time: 01/05/21  1314     History Chief Complaint  Patient presents with   Altered Mental Status   Seizures    Steve [name] is a 55 y.o. male presented emergency room via EMS with concern for altered mental status.  Patient presents from a group home, where he was witnessed to be lying on the ground with possible convulsions by his roommates.  It is unclear what his total downtime was.  EMS reports that the patient was combative and nonverbal on their arrival, noncompliant with them.  Given 200 cc of fluid in total in route to the hospital.  His glucose is 127.  On arrival the patient is able to verbalize very little, states that he has a headache.  He is able to tell me his name and his date of birth, not that he understands that he is in the hospital, but cannot follow any other commands or provide any other information.  He repeatedly says no when I ask if he has used any drugs today.  He states no when asked if he has had a seizure before.  Per my review of his medical chart, there is not appear to be any other additional medical information in his record in terms of prior encounters.  PDMP review shows no recent prescriptions filled.  HPI     No past medical history on file.  Patient Active Problem List   Diagnosis Date Noted   Acute metabolic encephalopathy 01/05/2021       No family history on file.     Home Medications Prior to Admission medications   Medication Sig Start Date End Date Taking? Authorizing Provider  FLUoxetine (PROZAC) 20 MG capsule Take 40 mg by mouth daily. 12/27/20   [provider]  gabapentin (NEURONTIN) 400 MG capsule Take 400 mg by mouth 3 (three) times daily. 11/27/20   [provider]  hydrOXYzine (ATARAX) 50 MG tablet Take 50 mg by mouth every 4 (four) hours as needed. 11/27/20   [provider]  traZODone (DESYREL)  50 MG tablet Take 50 mg by mouth at bedtime as needed. 11/13/20   [provider]    Allergies    Patient has no allergy information on record.  Review of Systems   Review of Systems  Unable to perform ROS: Mental status change (level 5 caveat)   Physical Exam Updated Vital Signs BP (!) 120/101    Pulse 70    Temp (!) 100.8 F (38.2 C) (Rectal)    Resp 20    Ht 5\' 8"  (1.727 m)    Wt 67.5 kg    SpO2 96%    BMI 22.63 kg/m   Physical Exam Constitutional:      Comments: Diaphoretic  HENT:     Head: Normocephalic and atraumatic.  Eyes:     Conjunctiva/sclera: Conjunctivae normal.     Pupils: Pupils are equal, round, and reactive to light.  Cardiovascular:     Rate and Rhythm: Regular rhythm. Tachycardia present.  Pulmonary:     Effort: Pulmonary effort is normal. No respiratory distress.  Abdominal:     General: There is no distension.     Tenderness: There is no abdominal tenderness.  Skin:    General: Skin is warm and dry.  Neurological:     Mental Status: He is alert.     GCS: GCS eye subscore is 4. GCS verbal subscore  is 3. GCS motor subscore is 6.     Comments: Moving all extremities Does not follow commands    ED Results / Procedures / Treatments   Labs (all labs ordered are listed, but only abnormal results are displayed) Labs Reviewed  COMPREHENSIVE METABOLIC PANEL - Abnormal; Notable for the following components:      Result Value   CO2 19 (*)    Glucose, Bld 122 (*)    BUN 32 (*)    Creatinine, Ser 3.27 (*)    Calcium 8.0 (*)    AST 1,267 (*)    ALT 893 (*)    GFR, Estimated 21 (*)    Anion gap 19 (*)    All other components within normal limits  CBC - Abnormal; Notable for the following components:   WBC 23.7 (*)    All other components within normal limits  LACTIC ACID, PLASMA - Abnormal; Notable for the following components:   Lactic Acid, Venous 7.6 (*)    All other components within normal limits  PROTIME-INR - Abnormal; Notable for the  following components:   Prothrombin Time 15.5 (*)    All other components within normal limits  CBC - Abnormal; Notable for the following components:   WBC 20.3 (*)    RBC 4.14 (*)    All other components within normal limits  CREATININE, SERUM - Abnormal; Notable for the following components:   Creatinine, Ser 3.04 (*)    GFR, Estimated 23 (*)    All other components within normal limits  LACTIC ACID, PLASMA - Abnormal; Notable for the following components:   Lactic Acid, Venous 2.8 (*)    All other components within normal limits  GLUCOSE, CAPILLARY - Abnormal; Notable for the following components:   Glucose-Capillary 131 (*)    All other components within normal limits  TROPONIN I (HIGH SENSITIVITY) - Abnormal; Notable for the following components:   Troponin I (High Sensitivity) 1,823 (*)    All other components within normal limits  TROPONIN I (HIGH SENSITIVITY) - Abnormal; Notable for the following components:   Troponin I (High Sensitivity) 2,359 (*)    All other components within normal limits  RESP PANEL BY RT-PCR (FLU A&B, COVID) ARPGX2  CULTURE, BLOOD (ROUTINE X 2) W REFLEX TO ID PANEL  CULTURE, BLOOD (ROUTINE X 2) W REFLEX TO ID PANEL  ETHANOL  APTT  HIV ANTIBODY (ROUTINE TESTING W REFLEX)  RAPID URINE DRUG SCREEN, HOSP PERFORMED  URINALYSIS, ROUTINE W REFLEX MICROSCOPIC  LACTIC ACID, PLASMA  LACTIC ACID, PLASMA  CBC  CK  COMPREHENSIVE METABOLIC PANEL  CBG MONITORING, ED    EKG EKG Interpretation  Date/Time:  Tuesday January 05 2021 13:25:49 EST Ventricular Rate:  114 PR Interval:  132 QRS Duration: 108 QT Interval:  322 QTC Calculation: 444 R Axis:   266 Text Interpretation: Sinus tachycardia RSR' in V1 or V2, right VCD or RVH Confirmed by Alvester Chou [phone]) on 01/05/2021 1:47:03 PM  Radiology CT HEAD WO CONTRAST ( )  Result Date: 01/05/2021 CLINICAL DATA:  Mental status change, unknown cause EXAM: CT HEAD WITHOUT CONTRAST TECHNIQUE:  Contiguous axial images were obtained from the base of the skull through the vertex without intravenous contrast. COMPARISON:  None. FINDINGS: Brain: No evidence of acute infarction, hemorrhage, or hydrocephalus. Prominent fluid space at the left cerebellopontine angle measuring approximately 3 x 2 x 2 cm which appears to have mild mass effect upon the adjacent cerebellar hemisphere. Space appears to have internal density near that  of CSF, although evaluation is slightly limited given artifact from the adjacent skull base. Vascular: No hyperdense vessel or unexpected calcification. Skull: Normal. Negative for fracture or focal lesion. Sinuses/Orbits: No acute finding. Other: None. IMPRESSION: 1. No acute intracranial findings. 2. 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 Correlation with prior outside imaging of the brain is recommended to assess stability. If no prior imaging is available, a follow-up contrast enhanced MRI could be performed on a nonemergent basis. Electronically Signed   By: Duanne Guess D.O.   On: 01/05/2021 14:39   DG Chest Port 1 View  Result Date: 01/05/2021 CLINICAL DATA:  Questionable sepsis EXAM: PORTABLE CHEST 1 VIEW COMPARISON:  None. FINDINGS: Normal heart size. Normal mediastinal contour. No pneumothorax. No pleural effusion. No overt pulmonary edema. Mild hazy bibasilar lung opacities. IMPRESSION: Mild hazy bibasilar lung opacities, which could represent aspiration or atelectasis. Electronically Signed   By: Delbert Phenix M.D.   On: 01/05/2021 14:57    Procedures Procedures   Medications Ordered in ED Medications  midazolam (VERSED) 5 MG/5ML injection ( Intravenous Canceled Entry 01/05/21 1345)  docusate sodium (COLACE) capsule 100 mg (has no administration in time range)  polyethylene glycol (MIRALAX / GLYCOLAX) packet 17 g (has no administration in time range)  heparin injection 5,000  Units (has no administration in time range)  diphenhydrAMINE (BENADRYL) injection 12.5 mg (12.5 mg Intravenous Given 01/05/21 1604)  dexmedetomidine (PRECEDEX) 400 MCG/100ML (4 mcg/mL) infusion (1 mcg/kg/hr  67.5 kg Intravenous New Bag/Given 01/05/21 1715)  levETIRAcetam (KEPPRA) 1,350 mg in sodium chloride 0.9 % 100 mL IVPB (has no administration in time range)  lactated ringers infusion ( Intravenous New Bag/Given 01/05/21 1750)  Ampicillin-Sulbactam (UNASYN) 3 g in sodium chloride 0.9 % 100 mL IVPB (has no administration in time range)  sodium chloride 0.9 % bolus 1,000 mL (0 mLs Intravenous Stopped 01/05/21 1432)  sodium chloride 0.9 % bolus 1,000 mL (1,000 mLs Intravenous New Bag/Given 01/05/21 1448)  ceFEPIme (MAXIPIME) 2 g in sodium chloride 0.9 % 100 mL IVPB (0 g Intravenous Stopped 01/05/21 1648)  vancomycin (VANCOCIN) IVPB 1000 mg/200 mL premix (0 mg Intravenous Stopped 01/05/21 1752)  lactated ringers bolus 1,000 mL (1,000 mLs Intravenous New Bag/Given 01/05/21 1552)  levETIRAcetam (KEPPRA) 2,000 mg in sodium chloride 0.9 % 250 mL IVPB (2,000 mg Intravenous New Bag/Given 01/05/21 1751)  LORazepam (ATIVAN) injection 1 mg (1 mg Intravenous Given 01/05/21 1724)  LORazepam (ATIVAN) injection 1 mg (1 mg Intravenous Given 01/05/21 1730)    ED Course  I have reviewed the triage vital signs and the nursing notes.  Pertinent labs & imaging results that were available during my care of the patient were reviewed by me and considered in my medical decision making (see chart for details).   This patient presents with fever, AMS. This involves an extensive number of treatment options, and is a complaint that carries with it a high risk of complications and morbidity.  The differential diagnosis includes infection vs ICH vs polysubstance use vs other  I ordered, reviewed, and interpreted labs.  WBC and lactate elevated. Trop elevated 1800.  Repeat pending.  Covid/flu negative I ordered imaging  studies which included CTH, dg chest I independently visualized and interpreted imaging which showed no evidence CVA or ICH, but noted possible PNA on xray chest , and the monitor tracing which showed sinus rhythm   Sepsis fluid bolus ordered, BS empiric antibiotics BP stable in ED  Lactate repeat pending following fluid bolus Critical care consulted and will admit patient   Clinical Course as of 01/05/21 1811  Tue Jan 05, 2021  1347 Pt to be taken for stat CT.  IV versed given for sedation [MT]  1441 Bp remains soft - 2nd liter IVF ordered, temp 100.69F, meets SIRS criteria [MT]  1442 IMPRESSION: 1. No acute intracranial findings. 2. 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 Correlation with prior outside imaging of the brain is recommended to assess stability. If no prior imaging is available, a follow-up contrast enhanced MRI could be performed on a nonemergent basis. [MT]  1501 Workup is consistent with infection , with some hypoxia raising concern for PNA as likely source.  He meets sepsis criteria. I don't expect this CT cyst seen on CT is likely cause of acute confusion. [MT]  1510 Lactate is significantly elevated.  Mental status otherwise appears to have improved.  Is now able to answer better questions.  He reports that he felt fine all week.  He cannot recall anything that happened today.  He reports he has a cough today, which is new.  He has some chest pain.  Given his lactate elevation and a soft blood pressure, I have consulted critical care to evaluate the patient for possible ICU admission versus stepdown.  MAP is 70 - does not require vasopressors at this time. [MT]  1514 Spoke to Gentry from Crit care - they will come evaluate patient [MT]  1528 Critical care to admit patient [MT]    Clinical Course User Index [MT] Sirr Kabel, Kermit Balo, MD     Final Clinical Impression(s) / ED Diagnoses Final  diagnoses:  Sepsis, due to unspecified organism, unspecified whether acute organ dysfunction present [facility])  Community acquired pneumonia, unspecified laterality  Confusion    Rx / DC Orders ED Discharge Orders     None        Breea Loncar, Kermit Balo, MD 01/05/21 [phone]

## 2021-01-05 NOTE — Progress Notes (Signed)
eLink Physician-Brief Progress Note Patient Name: Steve [name] DOB: July 14, 1965 MRN: [id number]   Date of Service  01/05/2021  HPI/Events of Note  Patient c/o of back pain.   eICU Interventions  Plan: Fentanyl 12.5 mcg IV X 1 now.      Intervention Category Major Interventions: Other:  Lenell Antu 01/05/2021, 11:43 PM

## 2021-01-05 NOTE — Progress Notes (Signed)
EEG unable to be preformed due patient agitated and moving around constantly. Tech will retry again later if not tomorrow. Kirby-Graham, N.P.informed.

## 2021-01-05 NOTE — ED Notes (Signed)
Got patient on the monitor

## 2021-01-05 NOTE — Consult Note (Addendum)
Neurology Consultation  Reason for Consult: seizure activity?  Referring Physician: Kreg Shropshire, NP.   CC: Combative after witnessed shaking spell at group home.   History is obtained from: chart.   HPI: Axyl Sitzman is a 55 y.o. male with unknown PMHx except possible seizures. He lives in a group home, and do not know the reason. His roommate stated patient had not been well x 2 days. Patient was found down and had full body convulsions. He was combative and agitated in ambulance and upon arrival. He is on Prozac, atarax, and trazodone for suspected psychiatry issues. He is on Gabapentin, unknown if for seizure.   He was loaded with Keppra in ED. He was found to have a fever of 100.8 with leukocytosis, very elevated liver enzymes, and high LA. Empiric antibiotics were started for sepsis/aspiration PNA. PCCM asked to admit patient. PCCM ordered EEG, but there is no way he could cooperate for this now.   CTH showed neg bleeding, but possible epidermoid or arachnoid cyst. In AKI, WBC 23.7K, LA 7.6. PCCM NP charted that patient had bruises on his tongue like he bit it.   Upon NP arrival to room, patient is very fidgety and flailing in bed, changing positions a lot. He is distracted by a rash on left posterior thigh and continual scratching. He speaks some one word answers, gives NP a thumbs up, but will not follow more complicated commands.   There are no past visits or notes in Care Everywhere for obtaining history.   Neurology asked to consult for possible seizure.   ROS: Unable to obtain due to altered mental status.   PMH: Unable to assess secondary to patient's altered mental status.    FHx: Unable to assess secondary to patient's altered mental status.    Social History:   has no history on file for tobacco use, alcohol use, and drug use.  Medications  Current Facility-Administered Medications:    ceFEPIme (MAXIPIME) 2 g in sodium chloride 0.9 % 100 mL IVPB, 2 g, Intravenous,  Once, Simonne Martinet, NP, Last Rate: 200 mL/hr at 01/05/21 1554, 2 g at 01/05/21 1554   docusate sodium (COLACE) capsule 100 mg, 100 mg, Oral, BID PRN, Simonne Martinet, NP   heparin injection 5,000 Units, 5,000 Units, Subcutaneous, Q8H, Simonne Martinet, NP   levETIRAcetam (KEPPRA) IVPB 500 mg/100 mL premix, 500 mg, Intravenous, Q12H, Simonne Martinet, NP   midazolam (VERSED) 5 MG/5ML injection, , Intravenous, PRN, Terald Sleeper, MD, 5 mg at 01/05/21 1335   polyethylene glycol (MIRALAX / GLYCOLAX) packet 17 g, 17 g, Oral, Daily PRN, Simonne Martinet, NP   vancomycin (VANCOCIN) IVPB 1000 mg/200 mL premix, 1,000 mg, Intravenous, Once, Simonne Martinet, NP, Last Rate: 200 mL/hr at 01/05/21 1553, 1,000 mg at 01/05/21 1553  Current Outpatient Medications:    FLUoxetine (PROZAC) 20 MG capsule, Take 40 mg by mouth daily., Disp: , Rfl:    gabapentin (NEURONTIN) 400 MG capsule, Take 400 mg by mouth 3 (three) times daily., Disp: , Rfl:    hydrOXYzine (ATARAX) 50 MG tablet, Take 50 mg by mouth every 4 (four) hours as needed., Disp: , Rfl:    traZODone (DESYREL) 50 MG tablet, Take 50 mg by mouth at bedtime as needed., Disp: , Rfl:    Exam: Current vital signs: BP (!) 120/101    Pulse 70    Temp (!) 100.8 F (38.2 C) (Rectal)    Resp (!) 23    SpO2 Marland Kitchen)  82%  Vital signs in last 24 hours: Temp:  [100.8 F (38.2 C)] 100.8 F (38.2 C) (12/20 1420) Pulse Rate:  [70-117] 70 (12/20 1530) Resp:  [11-25] 23 (12/20 1448) BP: (72-137)/(45-109) 120/101 (12/20 1530) SpO2:  [82 %-99 %] 82 % (12/20 1530)  PE: GENERAL: Acutely ill appearing male but not toxic. Awake, alert HEENT: normocephalic and atraumatic. LUNGS - Normal respiratory effort.  CV - RRR on tele. ABDOMEN - Soft, nontender. Ext: warm, well perfused. Psych: affect hyperactive.   NEURO:  Mental Status: Alert and oriented to name only.  Speech/Language: speech is without dysarthria or aphasia.  Unable to name or repeat. Comprehension is  slowed, but he follows simple commands like squeezing NPs hand and giving a thumbs up.   Cranial Nerves:  II: PERRL 3 mm/brisk. Looks around room.  III, IV, VI: EOMI. Lid elevation symmetric and full.  V. VII: face is symmetrical grossly.  VIII: hearing intact to voice. Motor:  Moves all extremities spontaneously and purposefully.  Tone is normal. Bulk is normal.  Gait- deferred for safety.   Labs I have reviewed labs in epic and the results pertinent to this consultation are: LA 7.6, Elevated Liver enzymes.   CBC    Component Value Date/Time   WBC 23.7 (H) 01/05/2021 1342   RBC 4.69 01/05/2021 1342   HGB 15.1 01/05/2021 1342   HCT 46.8 01/05/2021 1342   PLT 382 01/05/2021 1342   MCV 99.8 01/05/2021 1342   MCH 32.2 01/05/2021 1342   MCHC 32.3 01/05/2021 1342   RDW 15.3 01/05/2021 1342    CMP     Component Value Date/Time   NA 141 01/05/2021 1342   K 4.8 01/05/2021 1342   CL 103 01/05/2021 1342   CO2 19 (L) 01/05/2021 1342   GLUCOSE 122 (H) 01/05/2021 1342   BUN 32 (H) 01/05/2021 1342   CREATININE 3.27 (H) 01/05/2021 1342   CALCIUM 8.0 (L) 01/05/2021 1342   PROT 8.0 01/05/2021 1342   ALBUMIN 4.1 01/05/2021 1342   AST 1,267 (H) 01/05/2021 1342   ALT 893 (H) 01/05/2021 1342   ALKPHOS 90 01/05/2021 1342   BILITOT 1.0 01/05/2021 1342   GFRNONAA 21 (L) 01/05/2021 1342   Imaging MD reviewed the images obtained.  CT head No acute intracranial findings. 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 Correlation with prior outside imaging of the brain is recommended to assess stability. If no prior imaging is available, a follow-up contrast enhanced MRI could be performed on a nonemergent basis.  Assessment: 55 yo male who presents from a group home after witnessed generalized seizure activity with combativeness afterward. No history on chart, so unknown if on Gabapentin for seizures. He  is too combative and distracted to have EEG at this time. Loaded with Keppra and maintenance dose ordered.  UDS should be a priority to see if provoked. Also, evidence of infection could be considered a provocation for seizure.   Recommendations/Plan:  -UDS ASAP.  -MRI brain with and without contrast  -EEG -seizure precautions.  -20 mg per kg Keppra IV q12 hours.  -Frequent neuro checks.  -Continue to treat metabolic and infection as you are doing.    Pt seen by Jimmye Norman, NP/Neuro and later by MD. Note/plan to be edited by MD as needed.  Pager: [phone]    I have seen the patient and reviewed the above note.  He is confused, but able to answer simple  questions, he can tell me that he does not have any headache.  His neck is supple.  He has received cefepime and vancomycin.  He has a history of seizures on gabapentin, unclear when he last had a seizure.  He will need EEG, I would favor continuous EEG for a time to rule out intermittent seizures.  We will also need MRI, but I think he is too agitated the current time.  Also, gabapentin toxicity in the setting of AKI would be a significant consideration.  Neurology will continue to follow.  Ritta Slot, MD [facility] [phone]  If 7pm- 7am, please page neurology on call as listed in AMION.

## 2021-01-05 NOTE — ED Triage Notes (Signed)
Pt arrived via [facility] with c/c of seizure from "group home". Per EMS roommate reports full body convulsants and come combativeness. Pt arrived altered and not follow commands. Reports not acting like self for last couples days, unknown medical hx.   NSS,  108HR, 110/53, CBG 127, 90%RA

## 2021-01-05 NOTE — Progress Notes (Signed)
Pharmacy Antibiotic Note  Suraj Ramdass is a 55 y.o. male admitted on 01/05/2021 with sepsis.  Pharmacy has been consulted for Cefepime and vancomycin dosing.  WBC 23.7, LA 7.6, SCr 3.27 (BL ~ unknown)   Plan: -Cefepime 2 gm IV Q 24 hours -Vancomycin 1 gm IV load followed by vanc dosing per random levels -Monitor CBC, renal fx, cultures and clinical progress     Temp (24hrs), Avg:100.8 F (38.2 C), Min:100.8 F (38.2 C), Max:100.8 F (38.2 C)  Recent Labs  Lab 01/05/21 1342 01/05/21 1441  WBC 23.7*  --   LATICACIDVEN  --  7.6*    CrCl cannot be calculated (No successful lab value found.).    Not on File  Antimicrobials this admission: Cefepime 12/20 >>  Vancomycin 12/20 >>   Dose adjustments this admission:   Microbiology results: 12/20 BCx:    Thank you for allowing pharmacy to be a part of this patients care.   Vinnie Level, PharmD., BCPS, BCCCP Clinical Pharmacist Please refer to [facility] for unit-specific pharmacist

## 2021-01-06 ENCOUNTER — Inpatient Hospital Stay (HOSPITAL_COMMUNITY): Payer: Medicaid Other

## 2021-01-06 DIAGNOSIS — J189 Pneumonia, unspecified organism: Secondary | ICD-10-CM

## 2021-01-06 DIAGNOSIS — J690 Pneumonitis due to inhalation of food and vomit: Secondary | ICD-10-CM

## 2021-01-06 DIAGNOSIS — A419 Sepsis, unspecified organism: Secondary | ICD-10-CM

## 2021-01-06 DIAGNOSIS — R410 Disorientation, unspecified: Secondary | ICD-10-CM

## 2021-01-06 DIAGNOSIS — G008 Other bacterial meningitis: Secondary | ICD-10-CM

## 2021-01-06 DIAGNOSIS — R6520 Severe sepsis without septic shock: Secondary | ICD-10-CM

## 2021-01-06 LAB — COMPREHENSIVE METABOLIC PANEL
ALT: 877 U/L — ABNORMAL HIGH (ref 0–44)
ALT: 952 U/L — ABNORMAL HIGH (ref 0–44)
AST: 1688 U/L — ABNORMAL HIGH (ref 15–41)
AST: 1608 U/L — ABNORMAL HIGH (ref 15–41)
Albumin: 3.2 g/dL — ABNORMAL LOW (ref 3.5–5.0)
Albumin: 2.9 g/dL — ABNORMAL LOW (ref 3.5–5.0)
Alkaline Phosphatase: 55 U/L (ref 38–126)
Alkaline Phosphatase: 55 U/L (ref 38–126)
Anion gap: 11 (ref 5–15)
Anion gap: 11 (ref 5–15)
BUN: 59 mg/dL — ABNORMAL HIGH (ref 6–20)
BUN: 67 mg/dL — ABNORMAL HIGH (ref 6–20)
CO2: 20 mmol/L — ABNORMAL LOW (ref 22–32)
CO2: 19 mmol/L — ABNORMAL LOW (ref 22–32)
Calcium: 6.5 mg/dL — ABNORMAL LOW (ref 8.9–10.3)
Calcium: 6.6 mg/dL — ABNORMAL LOW (ref 8.9–10.3)
Chloride: 109 mmol/L (ref 98–111)
Chloride: 112 mmol/L — ABNORMAL HIGH (ref 98–111)
Creatinine, Ser: 3.79 mg/dL — ABNORMAL HIGH (ref 0.61–1.24)
Creatinine, Ser: 3.98 mg/dL — ABNORMAL HIGH (ref 0.61–1.24)
GFR, Estimated: 18 mL/min — ABNORMAL LOW (ref >60)
GFR, Estimated: 17 mL/min — ABNORMAL LOW (ref >60)
Glucose, Bld: 132 mg/dL — ABNORMAL HIGH (ref 70–99)
Glucose, Bld: 110 mg/dL — ABNORMAL HIGH (ref 70–99)
Potassium: 5.3 mmol/L — ABNORMAL HIGH (ref 3.5–5.1)
Potassium: 5.2 mmol/L — ABNORMAL HIGH (ref 3.5–5.1)
Sodium: 140 mmol/L (ref 135–145)
Sodium: 142 mmol/L (ref 135–145)
Total Bilirubin: 0.9 mg/dL (ref 0.3–1.2)
Total Bilirubin: 0.9 mg/dL (ref 0.3–1.2)
Total Protein: 5.9 g/dL — ABNORMAL LOW (ref 6.5–8.1)
Total Protein: 5.5 g/dL — ABNORMAL LOW (ref 6.5–8.1)

## 2021-01-06 LAB — GLUCOSE, CAPILLARY
Glucose-Capillary: 135 mg/dL — ABNORMAL HIGH (ref 70–99)
Glucose-Capillary: 132 mg/dL — ABNORMAL HIGH (ref 70–99)
Glucose-Capillary: 127 mg/dL — ABNORMAL HIGH (ref 70–99)
Glucose-Capillary: 97 mg/dL (ref 70–99)
Glucose-Capillary: 87 mg/dL (ref 70–99)

## 2021-01-06 LAB — CBC
HCT: 35.8 % — ABNORMAL LOW (ref 39.0–52.0)
Hemoglobin: 12 g/dL — ABNORMAL LOW (ref 13.0–17.0)
MCH: 32.3 pg (ref 26.0–34.0)
MCHC: 33.5 g/dL (ref 30.0–36.0)
MCV: 96.2 fL (ref 80.0–100.0)
Platelets: 218 10*3/uL (ref 150–400)
RBC: 3.72 MIL/uL — ABNORMAL LOW (ref 4.22–5.81)
RDW: 15.1 % (ref 11.5–15.5)
WBC: 12.9 10*3/uL — ABNORMAL HIGH (ref 4.0–10.5)
nRBC: 0 % (ref 0.0–0.2)

## 2021-01-06 LAB — ECHOCARDIOGRAM COMPLETE
Area-P 1/2: 3.6 cm2
Calc EF: 43.7 %
Height: 68 in
S' Lateral: 3.5 cm
Single Plane A2C EF: 41.8 %
Single Plane A4C EF: 44.7 %
Weight: 2380.97 oz

## 2021-01-06 LAB — CSF CELL COUNT WITH DIFFERENTIAL
RBC Count, CSF: 318 /mm3 — ABNORMAL HIGH
Tube #: 3
WBC, CSF: 1 /mm3 (ref 0–5)

## 2021-01-06 LAB — PROCALCITONIN: Procalcitonin: 19.81 ng/mL

## 2021-01-06 LAB — PROTEIN AND GLUCOSE, CSF
Glucose, CSF: 82 mg/dL — ABNORMAL HIGH (ref 40–70)
Total  Protein, CSF: 54 mg/dL — ABNORMAL HIGH (ref 15–45)

## 2021-01-06 IMAGING — US US RENAL
1 series · 14 of 25 positions shown · non-contrast
Comparison: None.

CLINICAL DATA: Acute kidney injury

EXAM:
RENAL / URINARY TRACT ULTRASOUND COMPLETE

[Series 1: us renal · 14 of 43 slices shown]
[im 1/43]
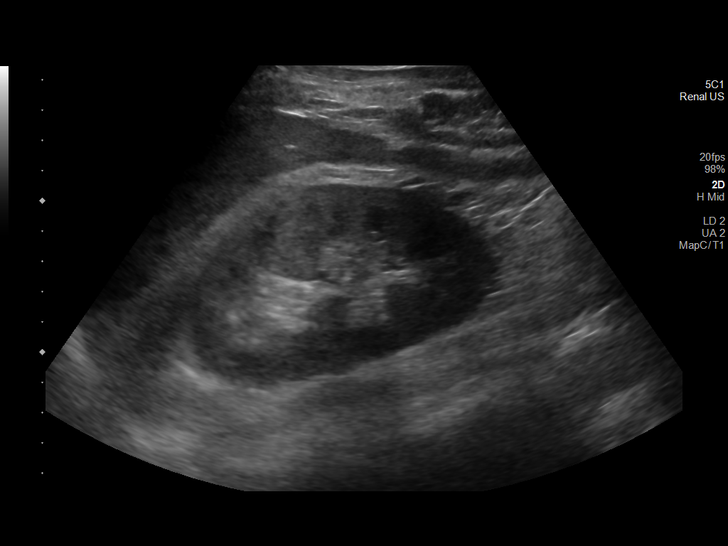
[im 4/43]
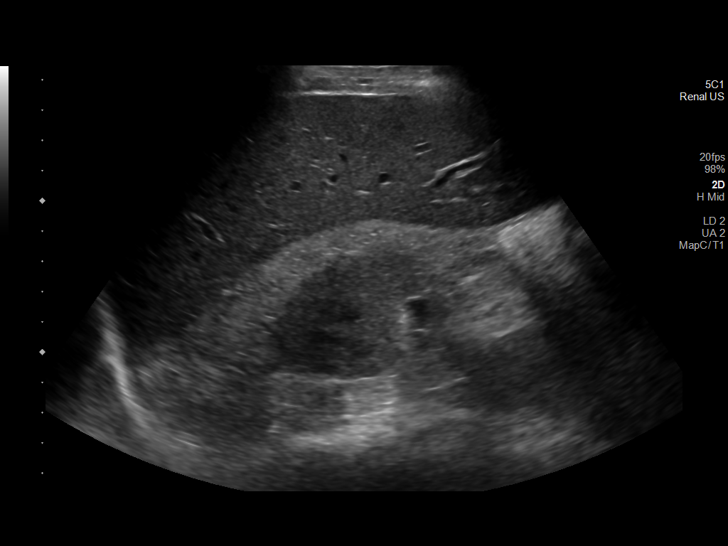
[im 8/43]
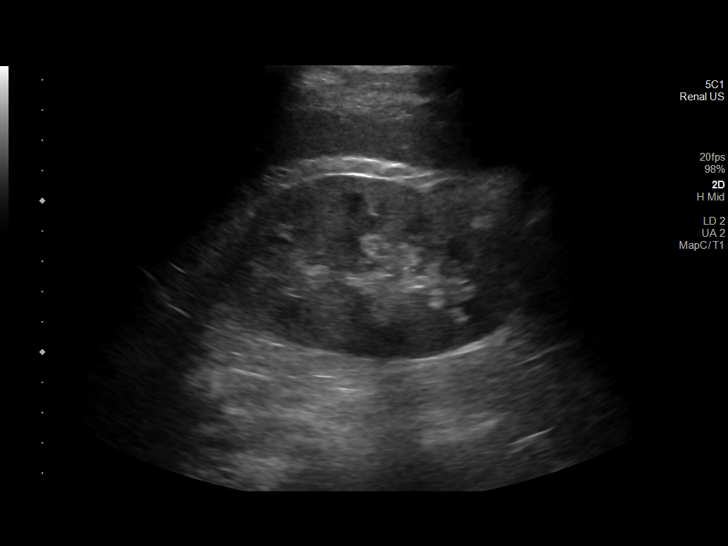
[im 11/43]
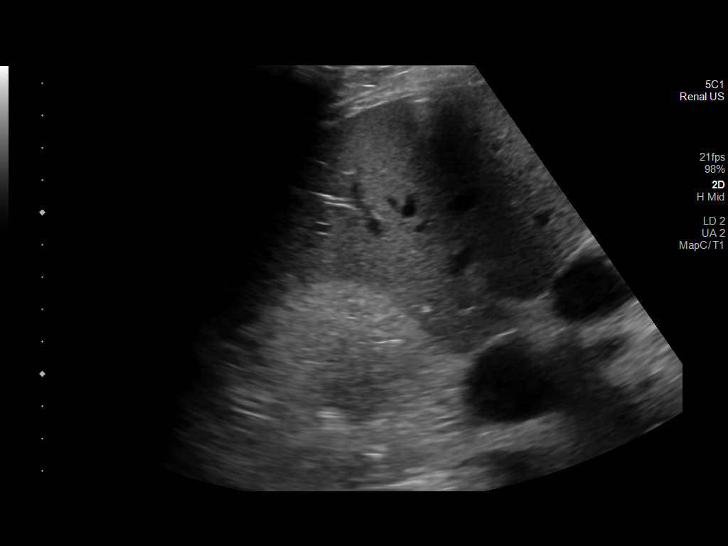
[im 15/43]
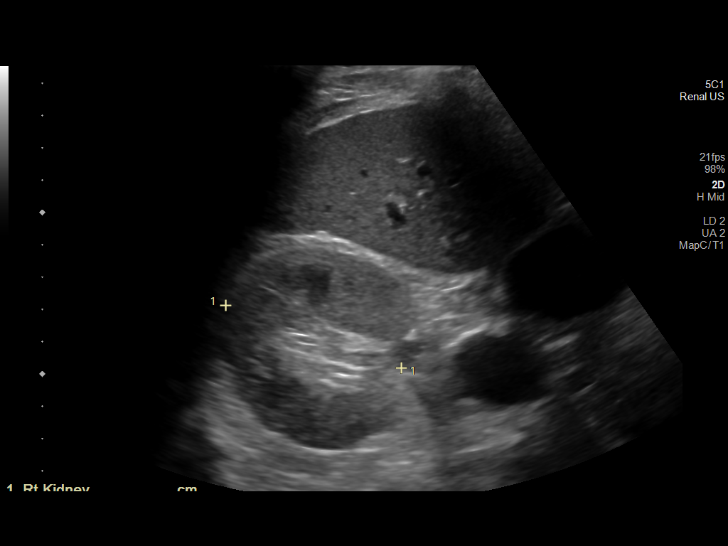
[im 16/43]
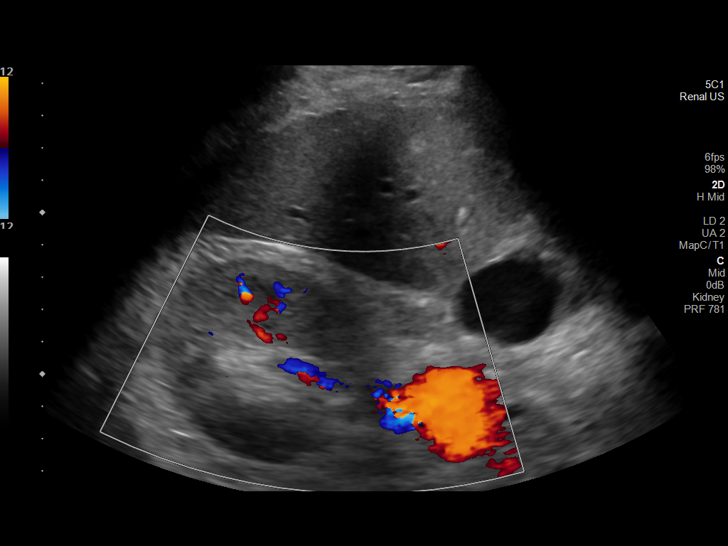
[im 20/43]
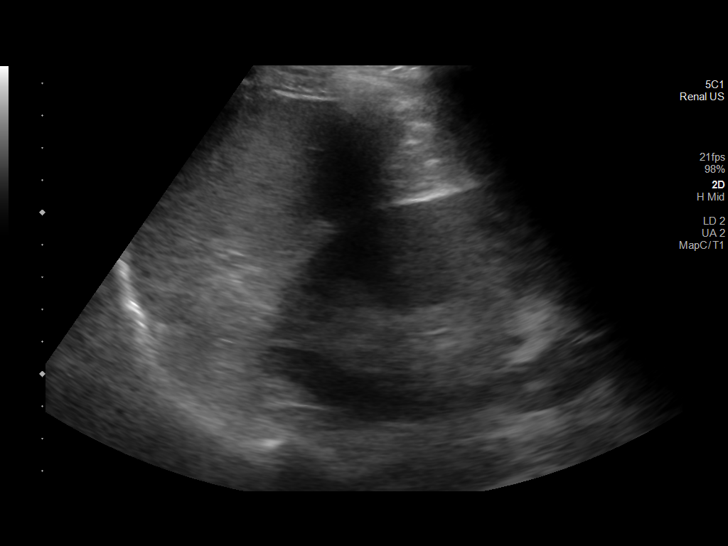
[im 23/43]
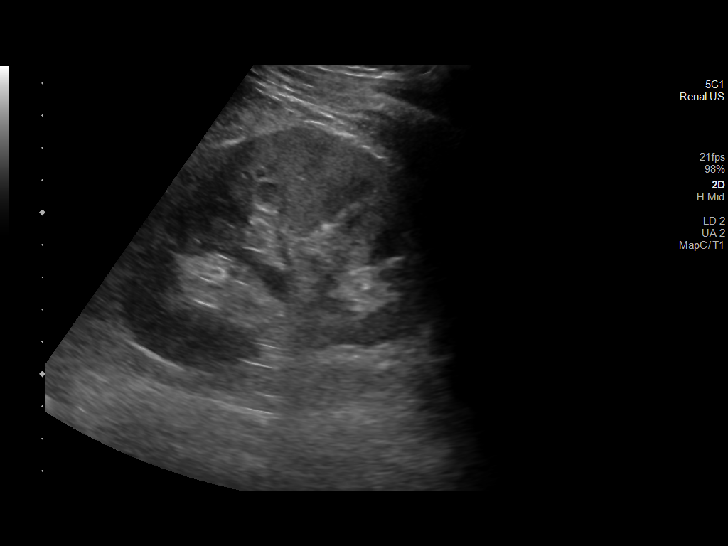
[im 27/43]
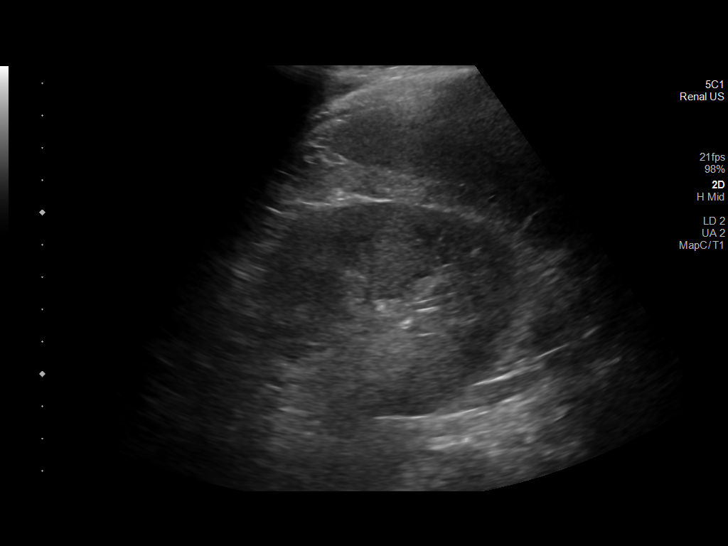
[im 29/43]
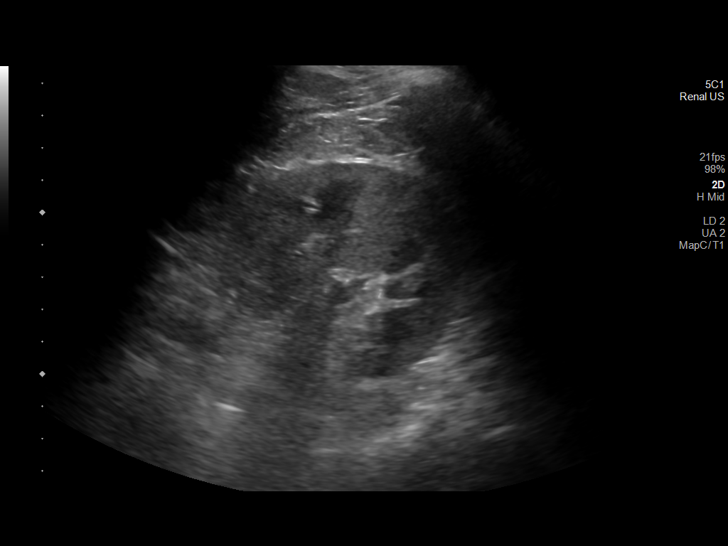
[im 32/43]
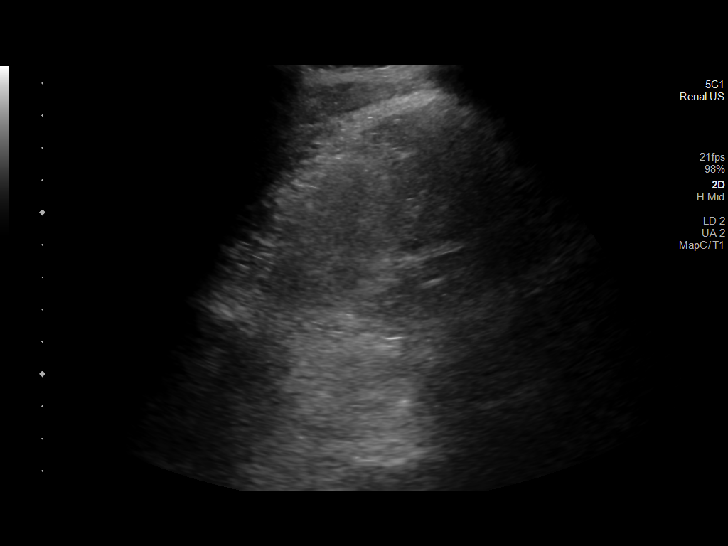
[im 36/43]
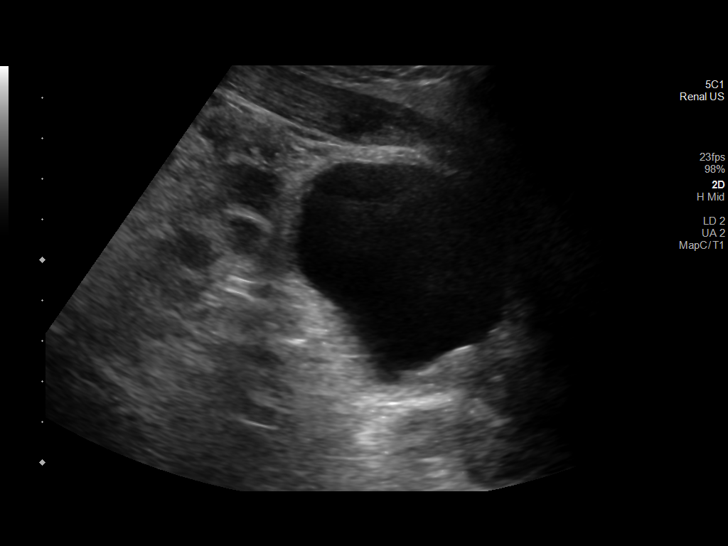
[im 39/43]
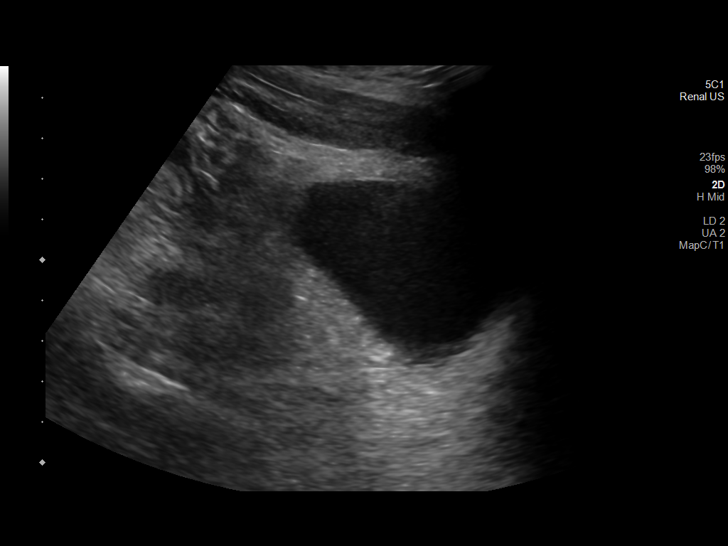
[im 43/43]
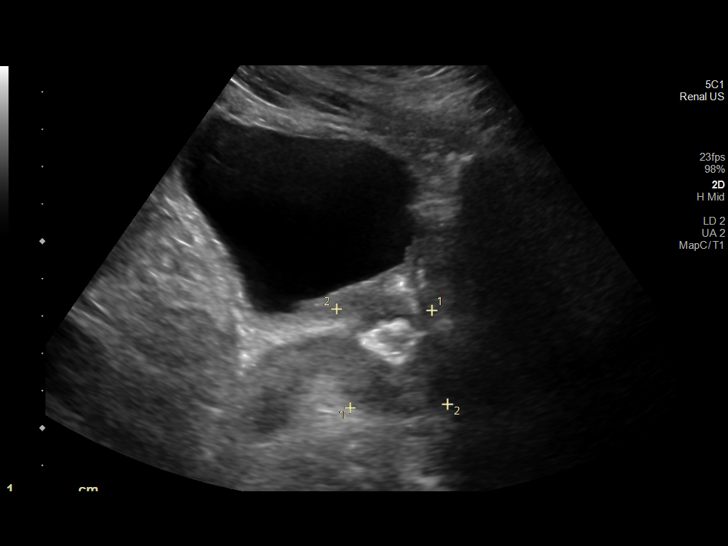

[14 of 25 positions shown; findings below may reference images not displayed]

FINDINGS: Right Kidney:

Renal measurements: 10.5 x 6.4 x 5.8 cm = volume: 204 mL.
Echogenicity within normal limits. No mass or hydronephrosis
visualized.

Left Kidney:

Renal measurements: 11.0 x 7.1 x 6.4 cm = volume: 263 mL.
Echogenicity within normal limits. No mass or hydronephrosis
visualized.

Bladder:

Appears normal for degree of bladder distention.

Other:

None.
IMPRESSION: No acute findings.  No hydronephrosis.

## 2021-01-06 IMAGING — DX DG CHEST 1V PORT
1 series · 1 of 1 positions shown · non-contrast
Comparison: [DATE]

CLINICAL DATA: Pneumonia

EXAM:
PORTABLE CHEST 1 VIEW

[chest]
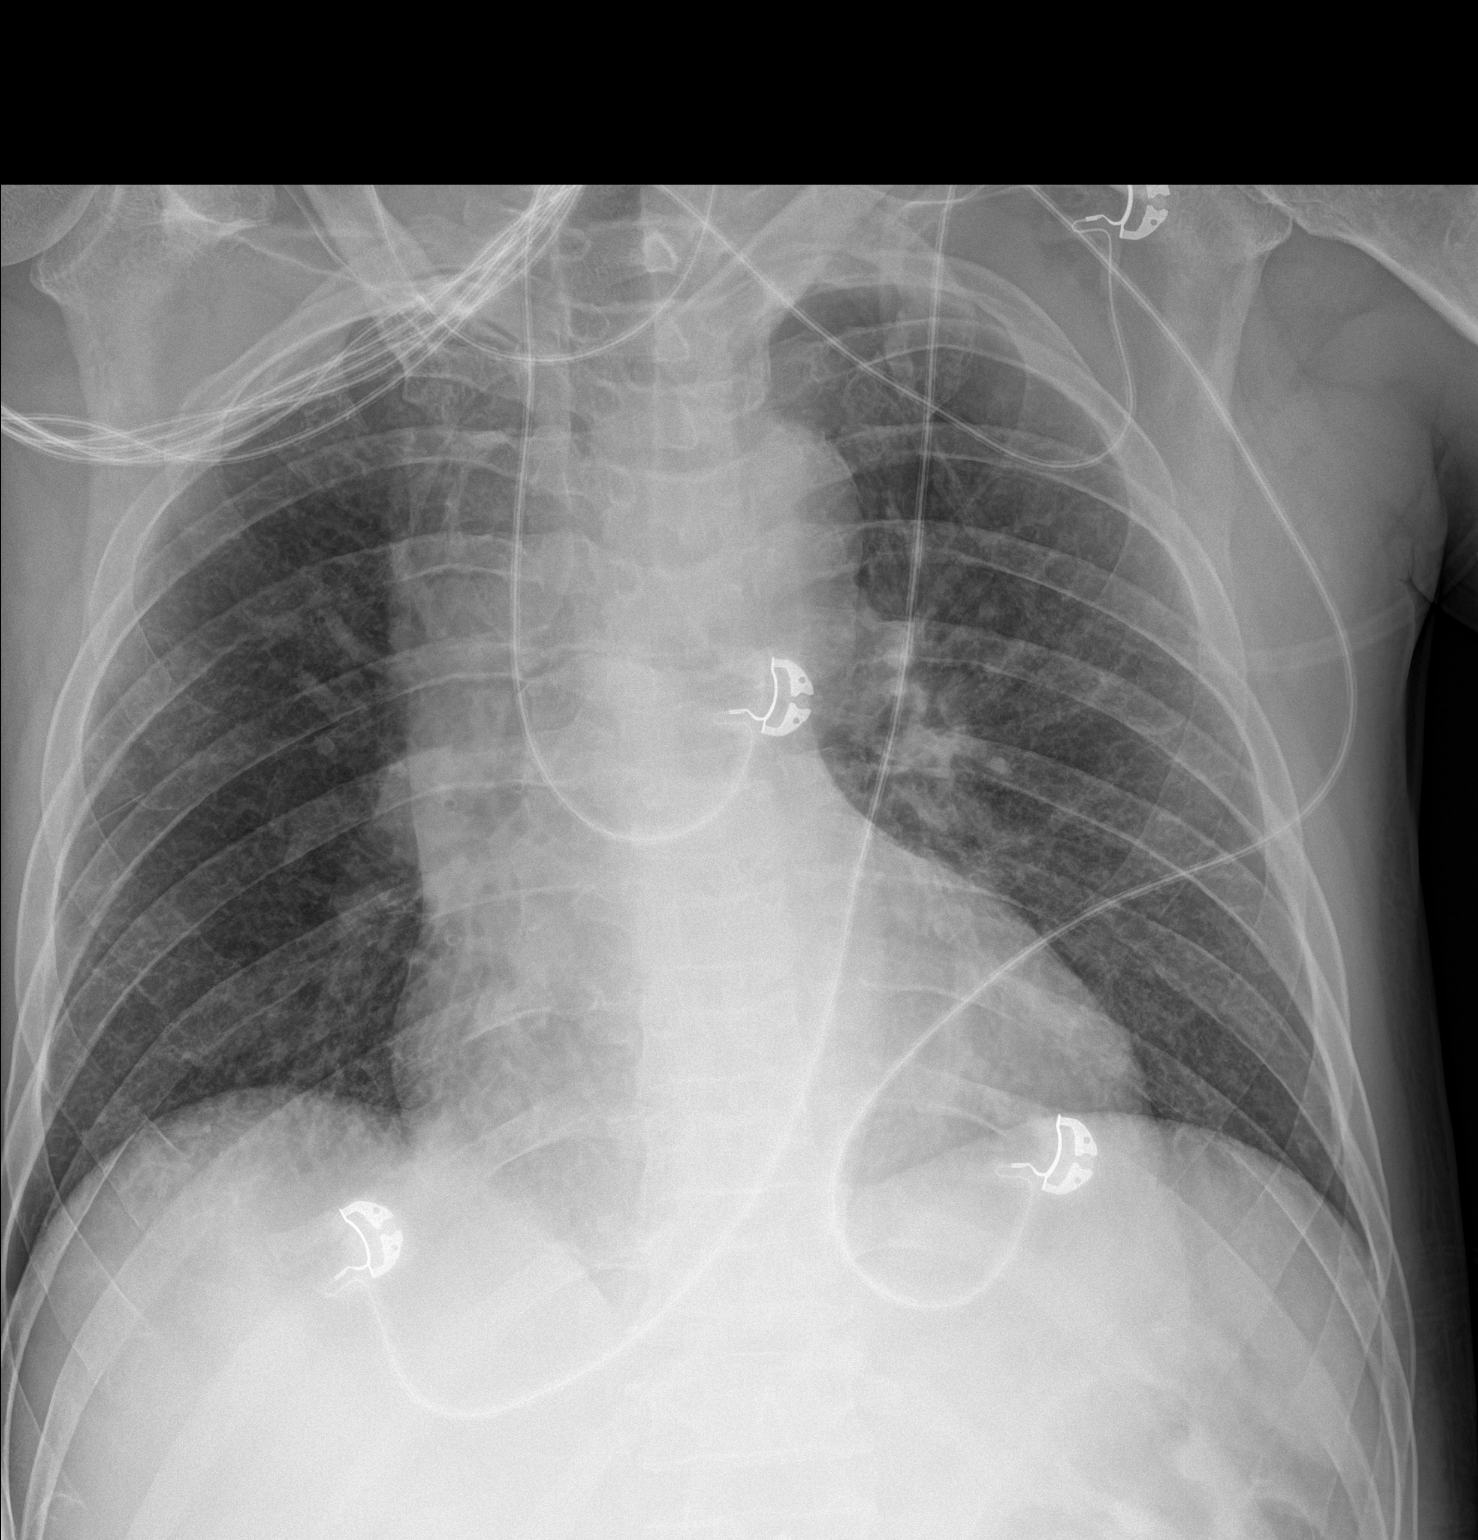

[1 of 1 positions shown; findings below may reference images not displayed]

FINDINGS: Heart and mediastinal contours are within normal limits. No focal
opacities or effusions. No acute bony abnormality.
IMPRESSION: No active disease.

## 2021-01-06 IMAGING — MR MR HEAD W/O CM
9 of 10 series · 35 of 48 positions shown · non-contrast
Comparison: CT head obtained 1 day prior

CLINICAL DATA: Altered mental status, found down

EXAM:
MRI HEAD WITHOUT CONTRAST
TECHNIQUE: Multiplanar, multiecho pulse sequences of the brain and surrounding
structures were obtained without intravenous contrast.

[Series 3: DWI · axial · 3.0mm · 1.09mm/px · z∈[-43,+116]mm · 8 of 108 slices shown (1 of 4)]
[im 1/108]
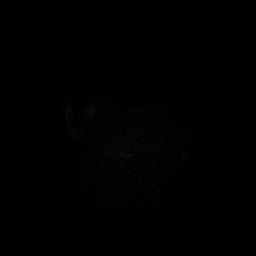
[im 12/108]
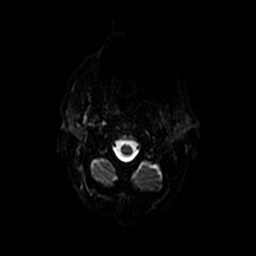
[im 36/108]
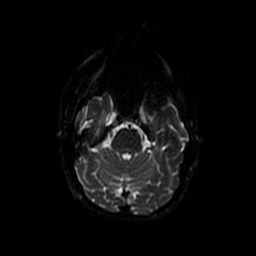
[im 48/108]
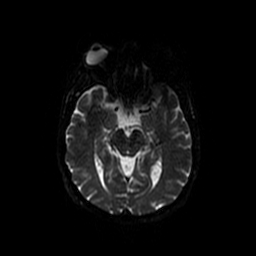
[im 60/108]
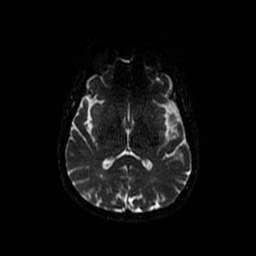
[im 72/108]
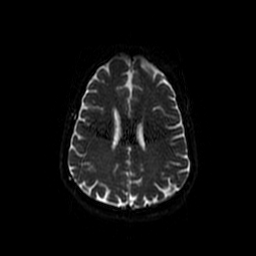
[im 96/108]
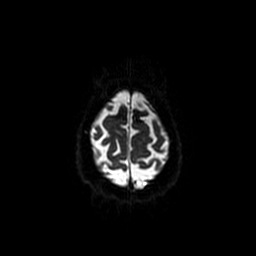
[im 108/108]
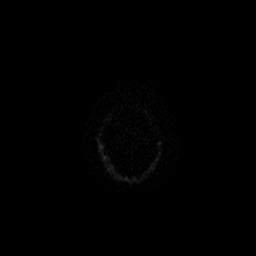

[Series 4: DWI · coronal · 5.0mm · 1.09mm/px · 7 of 76 slices shown (2 of 4)]
[im 1/76]
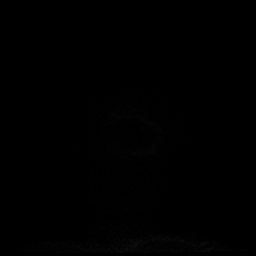
[im 13/76]
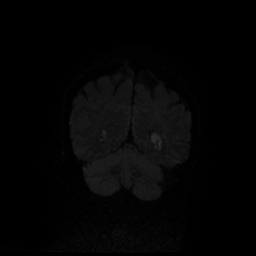
[im 26/76]
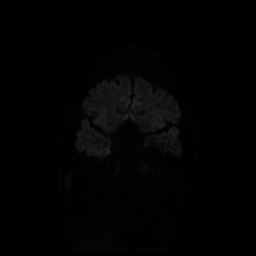
[im 38/76]
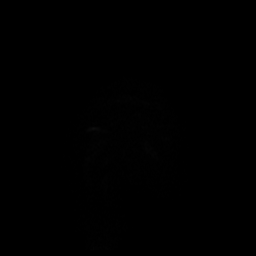
[im 51/76]
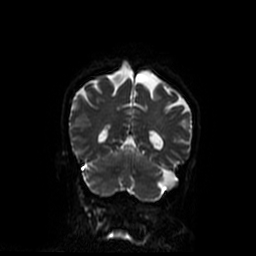
[im 63/76]
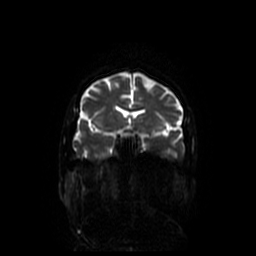
[im 76/76]
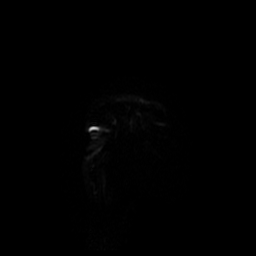

[Series 5: T1 · sagittal · 5.0mm · 0.47mm/px · 2 of 27 slices shown (1 of 2)]
[im 1/27]
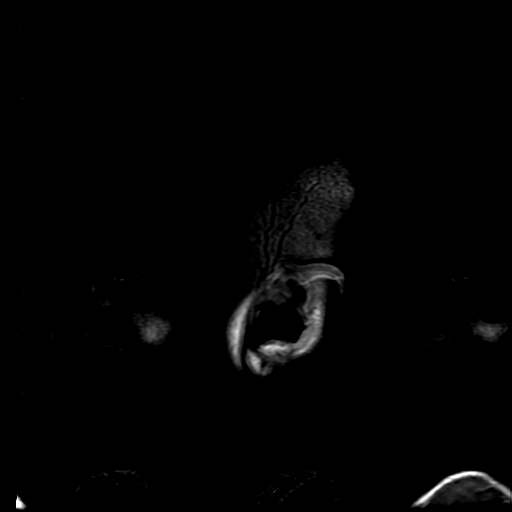
[im 27/27]
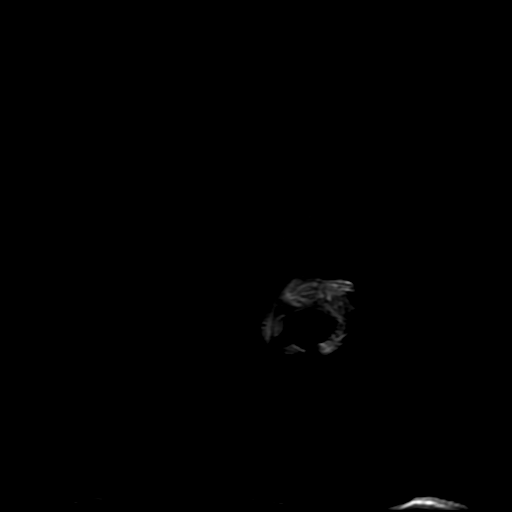

[Series 6: T2 · axial · 5.0mm · 0.47mm/px · z∈[-30,+130]mm · 3 of 28 slices shown (1 of 2)]
[im 1/28]
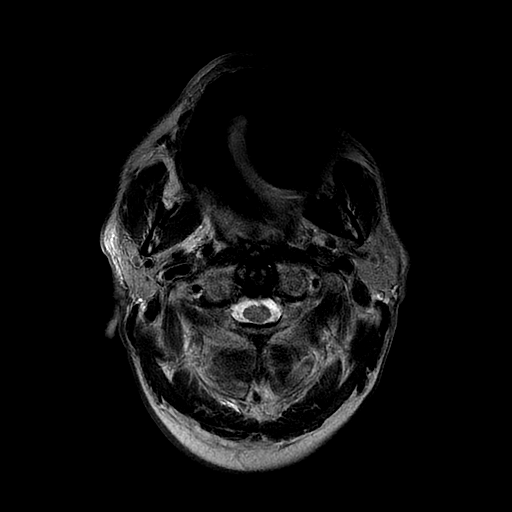
[im 14/28]
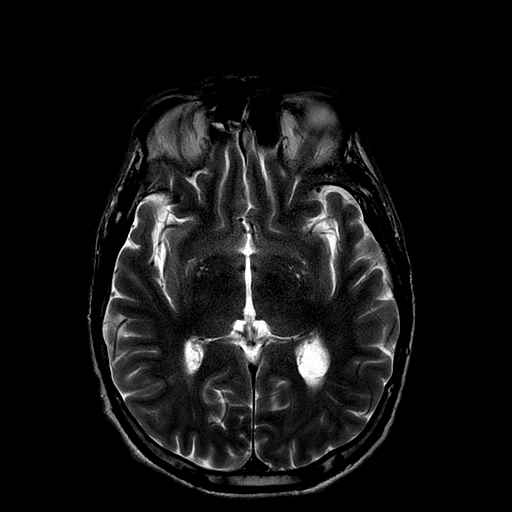
[im 28/28]
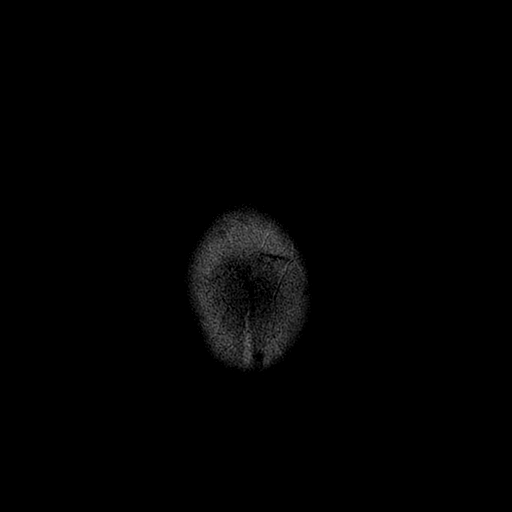

[Series 7: FLAIR · axial · 5.0mm · 0.47mm/px · z∈[-30,+130]mm · 3 of 28 slices shown]
[im 1/28]
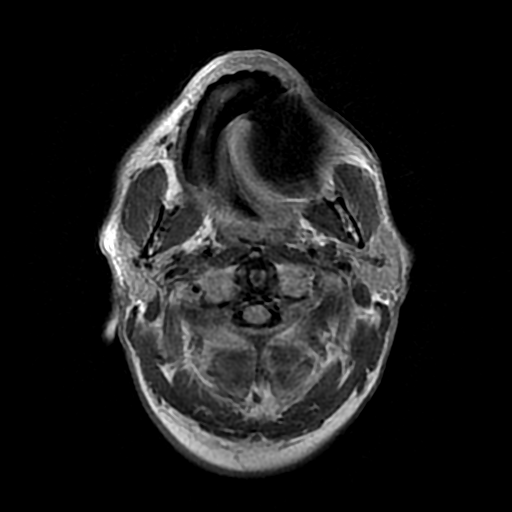
[im 14/28]
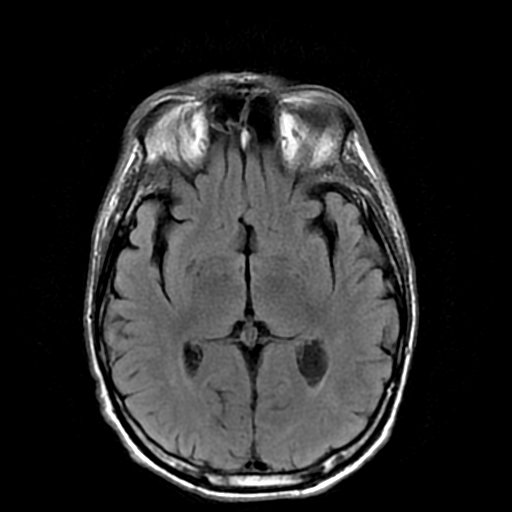
[im 28/28]
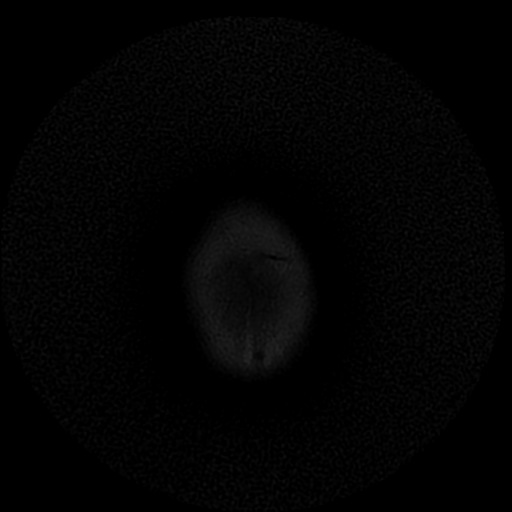

[Series 9: T1 · axial · 3.0mm · 0.47mm/px · 1 of 112 slices shown (2 of 2)]
[im 1/112]
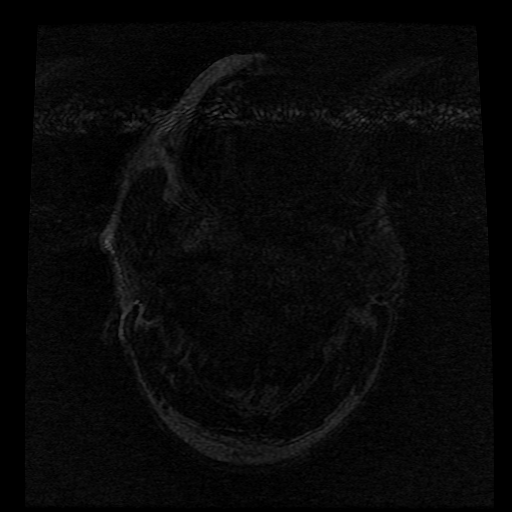

[Series 10: T2 · coronal · 5.0mm · 0.39mm/px · 3 of 29 slices shown (2 of 2)]
[im 1/29]
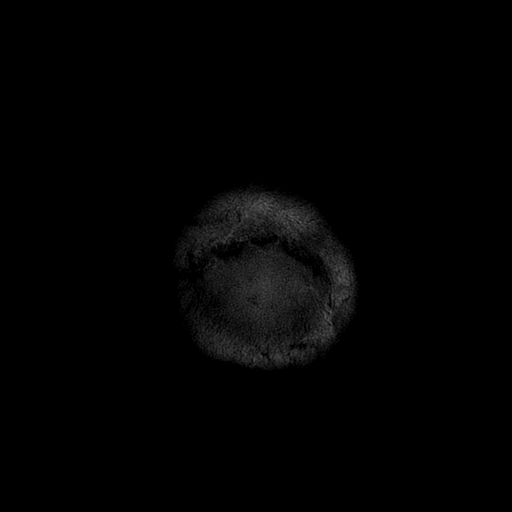
[im 15/29]
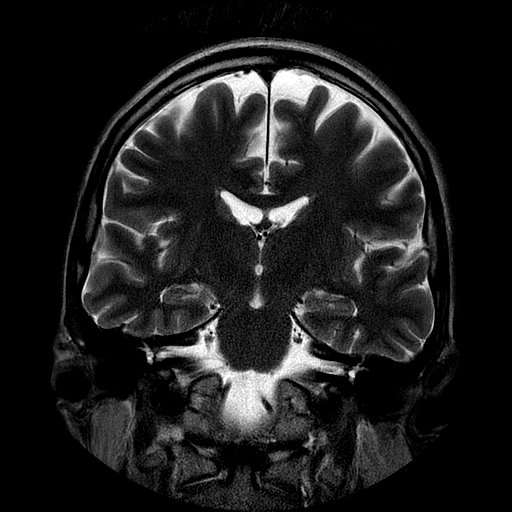
[im 29/29]
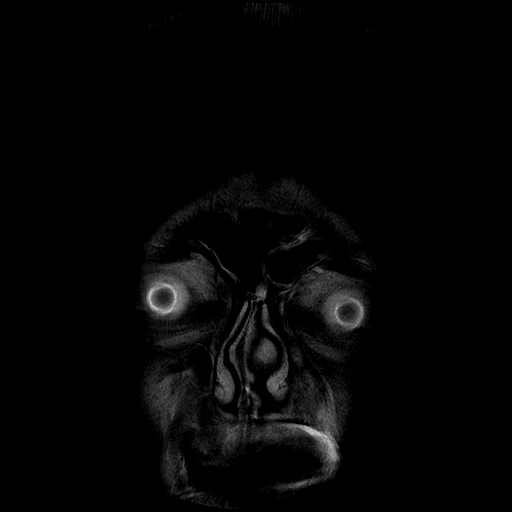

[Series 300: DWI · axial · 3.0mm · 1.09mm/px · z∈[-43,+113]mm · 5 of 53 slices shown (3 of 4)]
[im 1/53]
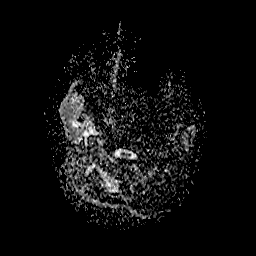
[im 14/53]
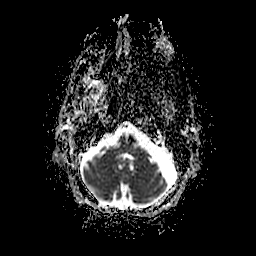
[im 27/53]
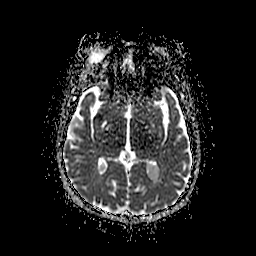
[im 40/53]
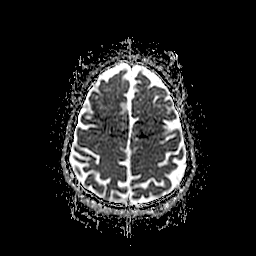
[im 53/53]
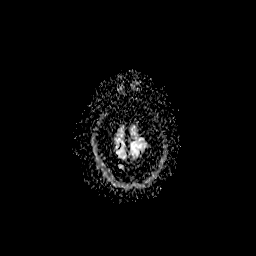

[Series 400: DWI · coronal · 5.0mm · 1.09mm/px · 3 of 38 slices shown (4 of 4)]
[im 1/38]
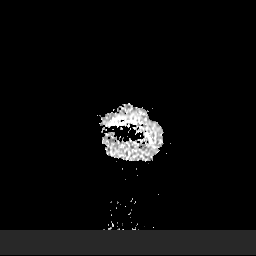
[im 19/38]
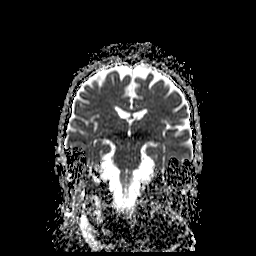
[im 38/38]
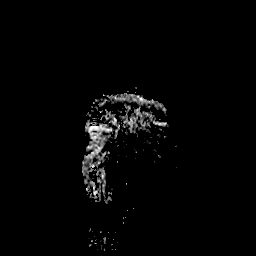

[35 of 48 positions shown; findings below may reference images not displayed]

FINDINGS: Brain: There is a punctate focus of diffusion restriction in the
right lentiform nucleus with associated T2/FLAIR signal abnormality
consistent with a tiny acute infarct. There is no other evidence of
acute infarct. There is no acute intracranial hemorrhage or
extra-axial fluid collection.

Parenchymal volume is normal. The ventricles are normal in size.
There are scattered small foci of FLAIR signal abnormality in the
subcortical and periventricular white matter likely reflecting mild
chronic white matter microangiopathy. There is an additional remote
infarct lacunar infarct in the right basal ganglia.

There is no solid mass lesion. There is no solid mass lesion or
evidence of epidermoid cyst in the prominent CSF space in the left
cerebellopontine angle, suggesting a benign arachnoid cyst. There is
no significant mass effect on the cerebellum. There is no midline
shift.

Vascular: Normal flow voids.

Skull and upper cervical spine: Normal marrow signal.

Sinuses/Orbits: There is mild mucosal thickening in the paranasal
sinuses. The globes and orbits are unremarkable.

Other: None.
IMPRESSION: 1. Tiny acute infarct in the right lentiform nucleus.
2. Mild chronic white matter microangiopathy.
3. Probable arachnoid cyst in the left posterior fossa without
significant mass effect on the cerebellum.

## 2021-01-06 IMAGING — DX DG ABD PORTABLE 1V
1 series · 1 of 1 positions shown · non-contrast
Comparison: None.

CLINICAL DATA: Pneumonia, abdominal pain

EXAM:
PORTABLE ABDOMEN - 1 VIEW

[abdomen]
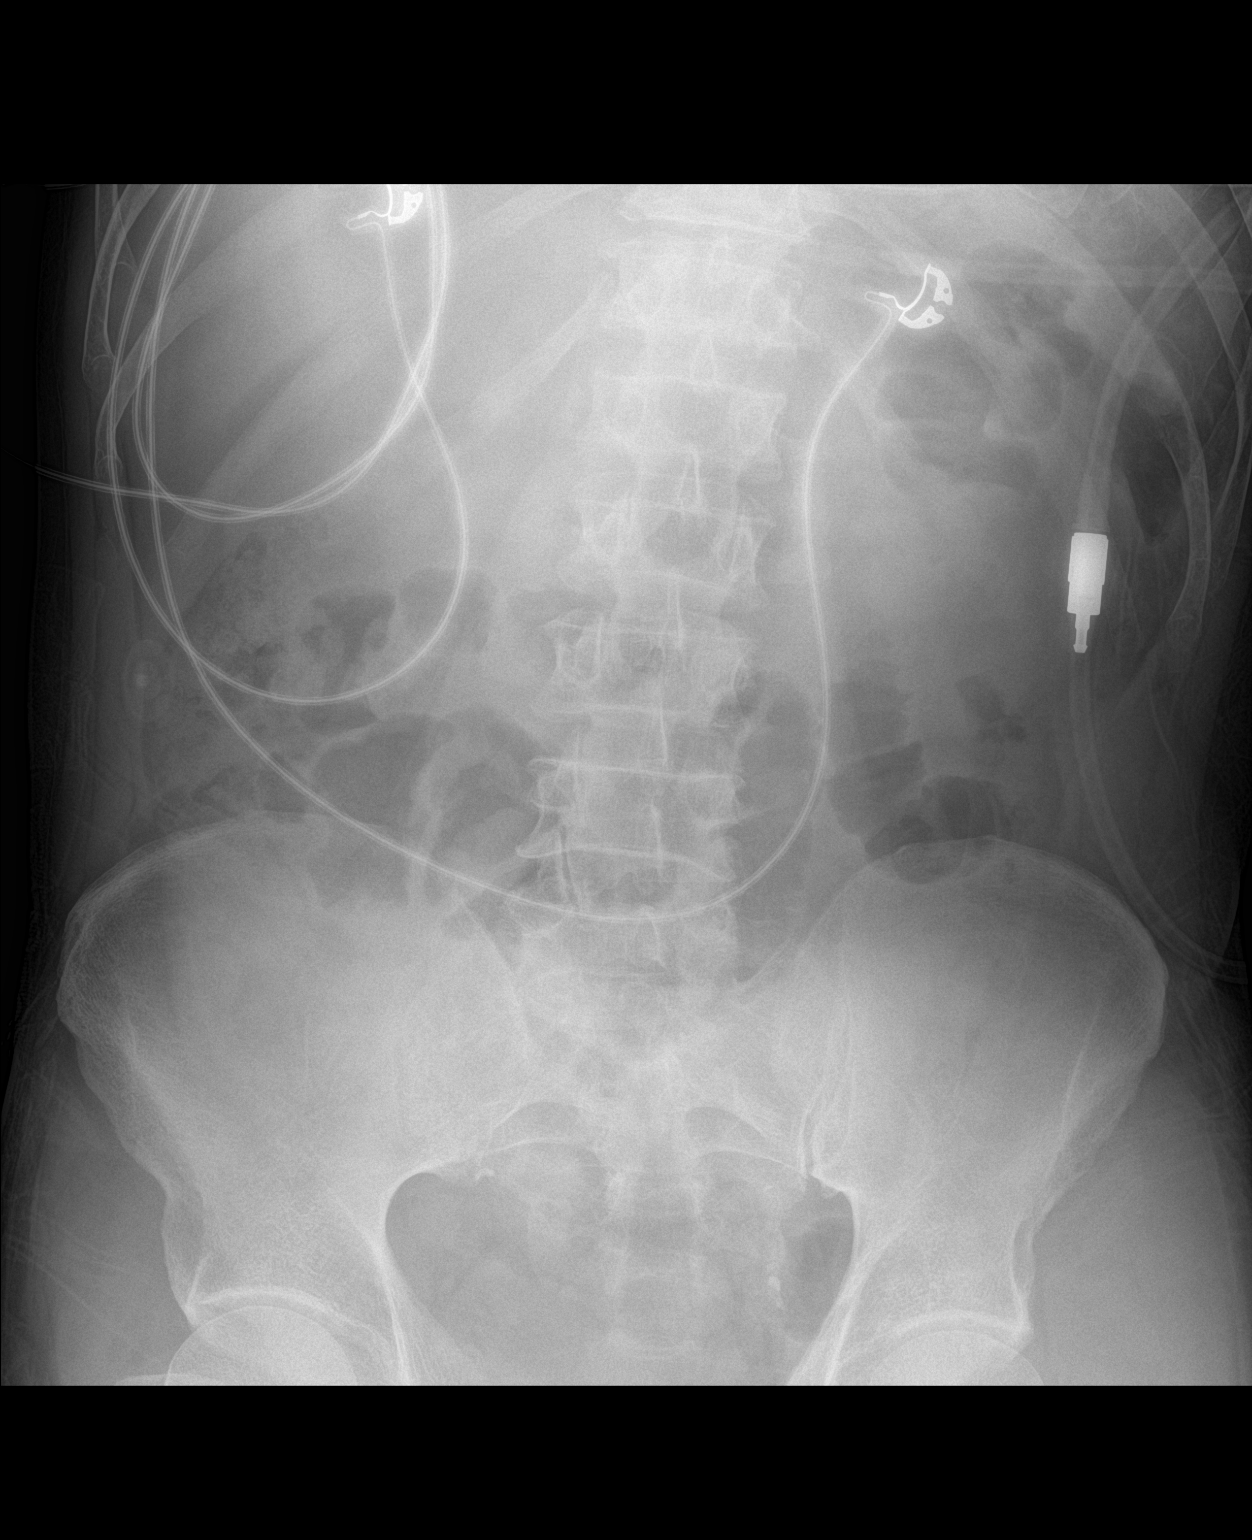

[1 of 1 positions shown; findings below may reference images not displayed]

FINDINGS: The bowel gas pattern is normal. No radio-opaque calculi or other
significant radiographic abnormality are seen.
IMPRESSION: Negative.

## 2021-01-06 MED ORDER — VANCOMYCIN HCL 750 MG/150ML IV SOLN
750.0000 mg | INTRAVENOUS | Status: DC
  Filled 2021-01-06: qty 150

## 2021-01-06 MED ORDER — SODIUM CHLORIDE 0.9 % IV SOLN
2.0000 g | Freq: Two times a day (BID) | INTRAVENOUS | Status: DC
  Administered 2021-01-06: 22:00:00 2 g via INTRAVENOUS
  Filled 2021-01-06: qty 20

## 2021-01-06 MED ORDER — FENTANYL CITRATE (PF) 100 MCG/2ML IJ SOLN
100.0000 ug | INTRAMUSCULAR | Status: DC | PRN
  Administered 2021-01-06: 14:00:00 100 ug via INTRAVENOUS
  Administered 2021-01-06: 11:00:00 50 ug via INTRAVENOUS
  Administered 2021-01-06 – 2021-01-11 (×11): 100 ug via INTRAVENOUS
  Filled 2021-01-06: qty 4
  Filled 2021-01-06 (×5): qty 2
  Filled 2021-01-06: qty 4
  Filled 2021-01-06 (×3): qty 2
  Filled 2021-01-06: qty 4
  Filled 2021-01-06: qty 2

## 2021-01-06 MED ORDER — CHLORHEXIDINE GLUCONATE CLOTH 2 % EX PADS
6.0000 | MEDICATED_PAD | Freq: Every day | CUTANEOUS | Status: DC
  Administered 2021-01-06 – 2021-01-14 (×9): 6 via TOPICAL

## 2021-01-06 MED ORDER — VANCOMYCIN HCL 1500 MG/300ML IV SOLN
1500.0000 mg | Freq: Once | INTRAVENOUS | Status: AC
  Administered 2021-01-06: 18:00:00 1500 mg via INTRAVENOUS
  Filled 2021-01-06: qty 300

## 2021-01-06 MED ORDER — MIDAZOLAM HCL 2 MG/2ML IJ SOLN
2.0000 mg | INTRAMUSCULAR | Status: DC | PRN
Start: 2021-01-06 — End: 2021-01-09
  Administered 2021-01-06 (×3): 2 mg via INTRAVENOUS
  Administered 2021-01-06: 11:00:00 1 mg via INTRAVENOUS
  Administered 2021-01-07 – 2021-01-08 (×13): 2 mg via INTRAVENOUS
  Filled 2021-01-06 (×2): qty 2
  Filled 2021-01-06: qty 4
  Filled 2021-01-06 (×14): qty 2

## 2021-01-06 MED ORDER — FENTANYL CITRATE (PF) 100 MCG/2ML IJ SOLN
12.5000 ug | INTRAMUSCULAR | Status: DC | PRN
  Administered 2021-01-06 – 2021-01-08 (×4): 12.5 ug via INTRAVENOUS
  Filled 2021-01-06 (×4): qty 2

## 2021-01-06 NOTE — Progress Notes (Signed)
Pharmacy Antibiotic Note  Steve [name] is a 55 y.o. male admitted on 01/05/2021 with meningitis.  Pharmacy has been consulted for Vancomycin dosing.  Plan: Vancomycin 1500mg  LD x1 followed by 750mg  q24hr (based on nomogram, weight 67kg, CrCl 29ml/min) Plan to obtain trough at steady state Will monitor for acute changes in renal function and adjust as needed F/u cultures results and de-escalate as appropriate   Height: 5\' 8"  (172.7 cm) Weight: 67.5 kg (148 lb 13 oz) IBW/kg (Calculated) : 68.4  Temp (24hrs), Avg:99 F (37.2 C), Min:97.4 F (36.3 C), Max:100.8 F (38.2 C)  Recent Labs  Lab 01/05/21 1342 01/05/21 1441 01/05/21 1538 01/05/21 1751 01/05/21 2137 01/06/21 0550  WBC 23.7*  --  20.3*  --   --  12.9*  CREATININE 3.27*  --  3.04*  --   --  3.79*  LATICACIDVEN  --  7.6* 2.8* 4.0* 2.4*  --     Estimated Creatinine Clearance: 21 mL/min (A) (by C-G formula based on SCr of 3.79 mg/dL (H)).    Not on File  Antimicrobials this admission: 12/20 Unasyn >>  12/21 Vanco >>   Dose adjustments this admission:   Microbiology results: 12/20 BCx: IP 12/21 LP: GPC  Thank you for allowing pharmacy to be a part of this patients care.  1/22, PharmD Clinical Pharmacist  Please check AMION for all [facility] Pharm[facility] numbers After 10:00 PM, call Main Pharmacy [phone]

## 2021-01-06 NOTE — Procedures (Signed)
Indication: Altered mental status  The procedure need to be done, and there is no family able to be contacted and therefore is done with emergency consent.  The patient was prepped and draped, and using sterile technique a 20 gauge quinke spinal needle was inserted in the L4-5 space.  As the opening pressure was getting to be measured, CSF flow stopped and the patient was becoming agitated and therefore it was decided prior to his CSF collection. Approximately 8 cc of CSF were obtained and sent for analysis.   Ritta Slot, MD [facility] [phone]  If 7pm- 7am, please page neurology on call as listed in AMION.

## 2021-01-06 NOTE — Progress Notes (Signed)
eLink Physician-Brief Progress Note Patient Name: Steve [name] DOB: 29-Jan-1965 MRN: [id number]   Date of Service  01/06/2021  HPI/Events of Note  Patient c/o low back pain again. Earlier Fentanyl was effective.   eICU Interventions  Plan: Fentanyl 12.5 mcg IV Q 4 hours PRN severe pain.     Intervention Category Major Interventions: Other:  Lenell Antu 01/06/2021, 3:56 AM

## 2021-01-06 NOTE — Procedures (Signed)
Patient Name: Steve [name]  MRN: [id number]  Epilepsy Attending: Charlsie Quest  Referring Physician/Provider: Zenia Resides, NP Date: 01/06/2021  Duration: 22.57 mins  Patient history: 55 yo male who presents from a group home after witnessed generalized seizure activity with combativeness afterward.  EEG to evaluate for seizure.  Level of alertness: Awake  AEDs during EEG study: LEV  Technical aspects: This EEG study was done with scalp electrodes positioned according to the 10-20 International system of electrode placement. Electrical activity was acquired at a sampling rate of 500Hz  and reviewed with a high frequency filter of 70Hz  and a low frequency filter of 1Hz . EEG data were recorded continuously and digitally stored.   Description: No posterior dominant rhythm was seen.  EEG showed continuous generalized 3 to 5 Hz theta-delta slowing.  Hyperventilation and photic stimulation were not performed.     ABNORMALITY - Continuous slow, generalized  IMPRESSION: This study is suggestive of moderate diffuse encephalopathy, nonspecific etiology. No seizures or epileptiform discharges were seen throughout the recording.  Elenna Spratling 

## 2021-01-06 NOTE — Progress Notes (Signed)
°  Echocardiogram 2D Echocardiogram has been performed.  Steve [name] 01/06/2021, 4:58 PM

## 2021-01-06 NOTE — Progress Notes (Signed)
Date and time results received: 01/06/21 1243  Test: CSF  Critical Value: positive for gram + cocci  Name of Provider Notified: Dr. Mcarthur Rossetti, Dr. Celine Mans   Orders Received? Or Actions Taken?: see orders

## 2021-01-06 NOTE — Progress Notes (Signed)
NAME:  Steve [name], MRN:  [id number], DOB:  August 12, 1965, LOS: 1 ADMISSION DATE:  01/05/2021, CONSULTATION DATE:  12/20 REFERRING MD:  12/20, CHIEF COMPLAINT:  sepsis and AMS   History of Present Illness:  This is a 54 year old white male with limited medical history on presentation to the emergency room.  He was brought from EMS after being found at a group home.  Reportedly his roommate found him having full body convulsions, followed by altered sensorium and combativeness.  Per report from EMS roommate had been noticing the patient was not acting typical for the prior few days before. On arrival to ER was found to be slightly hypotensive with systolic blood pressure in the 90s, room air saturations low 90s, he was confused, and at times combative.  A CT head was obtained this was negative for acute bleeding but did show prominent fluid space at the left cerebral pontine angle raising possibility of epidermoid or arachnoid cyst. Initial lab work: Serum creatinine of 3.27, abnormal LFTs, white blood cell count 23.7, and lactic acid 7.6.  Blood cultures were sent, empiric antibiotic started, IV fluid resuscitation administered.  Because of multiple metabolic derangements, altered sensorium, and concern about seizure the critical care team was asked to admit.  Pertinent  Medical History  Formal history not available on time of admission.  Home medications notable for Prozac, Neurontin, hydroxyzine and trazodone Significant Hospital Events: Including procedures, antibiotic start and stop dates in addition to other pertinent events   12/20 admitted for acute encephalopathy concern for seizure activity with agitation requiring precedex gtt  Interim History / Subjective:  Overnight, c/o back pain for which fentanyl was given This morning, more awake and alert and intermittently following  commands. Remains altered.   Objective   Blood pressure (!) 85/63, pulse 74, temperature 98.7 F (37.1 C),  temperature source Oral, resp. rate 17, height _0  (1.727 m), weight 67.5 kg, SpO2 96 %.        Intake/Output Summary (Last 24 hours) at 01/06/2021 0723 Last data filed at 01/06/2021 0401 Gross per 24 hour  Intake 3549.4 ml  Output 350 ml  Net 3199.4 ml   Filed Weights   01/05/21 1600  Weight: 67.5 kg    Physical Exam  Constitutional: Middle aged male, confused No distress.  HENT: Normocephalic and atraumatic, EOMI, anicteric sclerae, tongue laceration  Cardiovascular: RRR, S1 and S2 present, no m/r/g.  Distal pulses intact Respiratory: CTAB, on RA, no wheezing/rhonchi/rales GI: Nondistended, soft, +BS Musculoskeletal: Normal bulk and tone.  No peripheral edema noted. Neurological: Alert and oriented to self and time; PERRL, EOMI, spontaneously moving all extremities; neck supple; no apparent focal deficits  Skin: Warm and dry. Diffuse rash on bilat lower extremities  Resolved Hospital Problem list     Assessment & Plan:  Acute toxic metabolic encephalopathy Suspected to be in setting of seizure activity with postictal state. Aox2 this morning; but remains on precedex gtt for agitation. UDS +ve for benzos only. Did have fever on admission, however, no significant meningeal signs noted on exam   - Neurology consulted, appreciate recommendations - Continue Keppra - F/u EEG - F/u MRI Brain - Lumbar puncture  - Serial neuro checks  - Seizure precautions - 1:1 sitter   Possible arachnoid or epidermoid cyst, identified on CT head, unclear chronicity.   Not clear if this could be contributing to seizure, seems unlikely - F/u MRI brain   Sepsis 2/2 aspiration PNA Patient initially hypotensive on presentation with mild fever.  CXR with concerns of aspiration PNA for which patient given vanc/cefepime and started on Unasyn. Remains afebrile and leukocytosis improving. Procal elevated to 19.  - Continue IV Unasyn  - F/u blood cultures  - Trend CBC  Lactic acidosis with anion  gap metabolic acidosis - trend  In setting of seizure like activity; less likely to be secondary to sepsis. Lactate improving. AG resolved and metabolic acidosis improving.  - Trend BMP  Acute renal failure Rhabdomyolysis Hyperkalemia No known history of renal disease although no prior records can be found. CK>50,000 on presentation. sCr 3.27>3.79 with minimal UOP. UA with hemoglobinuria consistent with rhabdomyolysis. Pt has received 3.5L LR with only 350cc UOP.  - IV LR 250cc/hr - Trend renal function - Strict I&O - Renal US   Acute liver injury AST/ALT elevated to 1688/877 with normal Alk phos and T.bili. This is likely shock liver in setting of hypotension.  - Trend LFTs   Normocytic anemia In setting of acute illness vs hemodilution from fluid resuscitation. No signs of bleeding at this time  - Trend CBC - Iron studies when past acute illness   Best Practice (right click and "Reselect all SmartList Selections" daily)   Diet/type: NPO DVT prophylaxis: prophylactic heparin  GI prophylaxis: N/A Lines: N/A Foley:  N/A Code Status:  full code Last date of multidisciplinary goals of care discussion [no family listed, pt altered]  Labs   CBC: Recent Labs  Lab 01/05/21 1342 01/05/21 1538 01/06/21 0550  WBC 23.7* 20.3* 12.9*  HGB 15.1 13.6 12.0*  HCT 46.8 41.1 35.8*  MCV 99.8 99.3 96.2  PLT 382 321 945    Basic Metabolic Panel: Recent Labs  Lab 01/05/21 1342 01/05/21 1538 01/06/21 0550  NA 141  --  140  K 4.8  --  5.3*  CL 103  --  109  CO2 19*  --  20*  GLUCOSE 122*  --  132*  BUN 32*  --  59*  CREATININE 3.27* 3.04* 3.79*  CALCIUM 8.0*  --  6.5*   GFR: Estimated Creatinine Clearance: 21 mL/min (A) (by C-G formula based on SCr of 3.79 mg/dL (H)). Recent Labs  Lab 01/05/21 1342 01/05/21 1441 01/05/21 1538 01/05/21 1751 01/05/21 2137 01/06/21 0550  WBC 23.7*  --  20.3*  --   --  12.9*  LATICACIDVEN  --  7.6* 2.8* 4.0* 2.4*  --     Liver Function  Tests: Recent Labs  Lab 01/05/21 1342 01/06/21 0550  AST 1,267* 1,688*  ALT 893* 877*  ALKPHOS 90 55  BILITOT 1.0 0.9  PROT 8.0 5.9*  ALBUMIN 4.1 3.2*   No results for input(s): LIPASE, AMYLASE in the last 168 hours. No results for input(s): AMMONIA in the last 168 hours.  ABG No results found for: PHART, PCO2ART, PO2ART, HCO3, TCO2, ACIDBASEDEF, O2SAT   Coagulation Profile: Recent Labs  Lab 01/05/21 1342  INR 1.2    Cardiac Enzymes: Recent Labs  Lab 01/05/21 1751  CKTOTAL >50,000*    HbA1C: No results found for: HGBA1C  CBG: Recent Labs  Lab 01/05/21 1414 01/05/21 1626 01/05/21 1917 01/05/21 2315 01/06/21 0308  GLUCAP 87 131* 119* 136* 135*    Critical care time:

## 2021-01-06 NOTE — Progress Notes (Signed)
Subjective: Continues to be confused.  Does state that he has a mild headache today.  Exam: Vitals:   01/06/21 1100 01/06/21 1122  BP: 106/79   Pulse: 70   Resp: 18   Temp:  (!) 97.4 F (36.3 C)  SpO2: 97%    Gen: In bed, NAD Resp: non-labored breathing, no acute distress Abd: soft, nt  Neuro: MS: Awake, oriented to person only.  He follows commands, but is not very cooperative. CN: Pupils equal round and reactive, extraocular movements intact Motor: Moves all extremities well Sensory: Intact light touch  No meningismus  Pertinent Labs: Procalcitonin 19   Impression: 55 year old male with a reported history of seizures who presented with presumed pneumonia after experiencing a seizure at home,?  Aspiration.  With his persistent encephalopathy, I do think further evaluation including EEG/MRI/LP would be prudent at this time.  EEG was attempted overnight, however he was too agitated to have it performed.  He seems slightly calmer today.  Recommendations: 1) LP for cells, glucose, cultures, HSV 2) EEG 3) MRI brain 4) neurology to follow  Ritta Slot, MD [facility] [phone]  If 7pm- 7am, please page neurology on call as listed in AMION.

## 2021-01-06 NOTE — Progress Notes (Signed)
EEG complete - results pending 

## 2021-01-06 NOTE — Consult Note (Addendum)
[facility]ious Disease    Date of Admission:  01/05/2021     Reason for Consult: sepsis, gpc on gram stain csf    Referring Provider: Shearon Stalls    Lines:  Peripheral iv's  Abx: 12/20-c vanc 12/20-c amp/sulb        Assessment: AMS Severe sepsis  Rhabdomyolysis Lactic acidosis Seizure  55 yo male relatively unknown in terms of medical problems admitted from a group home after found down by his room mate for unclear duration in setting of a few days prior to that event not "acting himself," found to have low grade fever/lactic acidosis/labs c/w rhabdo, and confusion, s/p LP with relatively normal appearing csf fluid but gram stain with gpc in clusters   12/20 bcx ngtd 12/21 tube 3 --> csf cx (gram stain gpc clusters); 1 wbc; 310s rbc; clear color; glucose 82; protein 54  I called down and reviewed the csf plate with micro. It appears to be a contaminant but unclear where in the process this was. At this time while awaiting final csf culture and blood cx we will continue empiric abx coverage. I have asked labs to find csf from another tube and replate it. I have also ordered sentout for meningoencephalitis pcr and requested lab to use csf from a none-tube-3 sample  He was started on empiric antibacterial therapy with resolution of a one time febrile temperature. His wbc is also improving. He had no witnessed diarrhea so far. Exam basically comatose/postictal and minor pressure associated bony prominence skin changes from being down.  His uds only shows benzo from seizure tx. He was never intubated  Presuming this is infectious which at this time is still unclear, it doesn't appear from his csf alone this is bacterial meningitis. A viral process with rhabdo/seizure appears most consistent so far. This is not the time for tick related illness which also could potentially explain this nicely. His cxr is clear without evidence pna. Mri is pending, but doubt hsv  encephalitis as his objectives are improving without acyclovir coverage  His elevated lft seems to be explained from the rhabdo. No liver imaging yet. Aki related to sepsis vs rhabdo  Plan: F/u replating of csf and csf meningoencephalitis pcr sent out F/u final bcx Send full respiratory viral pcr (new nasopharyngeal swab -- discussed with nursing staff) Continue empiric abx for now -- would keep vancomycin and change amp/sulbactam to ceftriaxone cns dosing Await mri brain read        ------------------------------------------------ Principal Problem:   Acute metabolic encephalopathy    HPI: Steve [name] is a 55 y.o. male admitted from group home for seizure activities/ams, found to have sepsis like picture and csf with gpc in cluster on gram stain  History limited due to patient's mentation. No family members available.  Per signout/chart. Patient was found down in his group home by room mate. Prior to that a few days not acting himself without other known complaint. Seizure activities noted by room mate. On presentation patient had temperature once of 100.8. ck/lft very elevated, aki. Cxr clear. Head ct with ?arachnoid cyst  Hiv screen negative Uds benzo  He was started on empiric abx and intitially thought to have aspiration  Lp done with normal glucose/slightly elevated protein, 1 wbc but stain show gpc in cluster  Called labs who suspect from the appearance of csf appears to be contaminant. Asked them to replate from different csf tube if possible and send out  for csf meningoencephalitis pcr    Fam hx: Unknown given patient's current mental status    Pmh: Unknown  Allergy  Unknown   Meds prior to admission: Unknown  Review of Systems: ROS Unable to get ROS as patient having AMS        Scheduled Meds:  Chlorhexidine Gluconate Cloth  6 each Topical Daily   heparin  5,000 Units Subcutaneous Q8H   Continuous Infusions:  ampicillin-sulbactam  (UNASYN) IV Stopped (01/06/21 0646)   dexmedetomidine (PRECEDEX) IV infusion 0.8 mcg/kg/hr (01/06/21 1000)   lactated ringers 250 mL/hr at 01/06/21 1000   levETIRAcetam Stopped (01/06/21 0614)   vancomycin     [START ON 01/07/2021] vancomycin     PRN Meds:.diphenhydrAMINE, docusate sodium, fentaNYL (SUBLIMAZE) injection, fentaNYL (SUBLIMAZE) injection, midazolam, midazolam, polyethylene glycol   OBJECTIVE: Blood pressure 106/79, pulse 70, temperature (!) 97.4 F (36.3 C), temperature source Axillary, resp. rate 18, height 5\' 8"  (1.727 m), weight 67.5 kg, SpO2 97 %.  Physical Exam  General/constitutional: comatose/sleeping, getting tte done, not arousable HEENT: Normocephalic, PER/pinpoint. Neck supple CV: rrr no mrg Lungs: clear to auscultation, normal respiratory effort Abd: Soft Ext: no edema Skin: slight rug burn marks left lateral mid foot; scattered erythema/blister along elbows/side of feet; no other rash Neuro: comatose MSK: no peripheral joint swelling/tenderness/warmth  Central line presence: peripheral iv's   Lab Results Lab Results  Component Value Date   WBC 12.9 (H) 01/06/2021   HGB 12.0 (L) 01/06/2021   HCT 35.8 (L) 01/06/2021   MCV 96.2 01/06/2021   PLT 218 01/06/2021    Lab Results  Component Value Date   CREATININE 3.79 (H) 01/06/2021   BUN 59 (H) 01/06/2021   NA 140 01/06/2021   K 5.3 (H) 01/06/2021   CL 109 01/06/2021   CO2 20 (L) 01/06/2021    Lab Results  Component Value Date   ALT 877 (H) 01/06/2021   AST 1,688 (H) 01/06/2021   ALKPHOS 55 01/06/2021   BILITOT 0.9 01/06/2021      Microbiology: Recent Results (from the past 240 hour(s))  Resp Panel by RT-PCR (Flu A&B, Covid) Nasopharyngeal Swab     Status: None   Collection Time: 01/05/21  2:28 PM   Specimen: Nasopharyngeal Swab; Nasopharyngeal(NP) swabs in vial transport medium  Result Value Ref Range Status   SARS Coronavirus 2 by RT PCR NEGATIVE NEGATIVE Final    Comment:  (NOTE) SARS-CoV-2 target nucleic acids are NOT DETECTED.  The SARS-CoV-2 RNA is generally detectable in upper respiratory specimens during the acute phase of infection. The lowest concentration of SARS-CoV-2 viral copies this assay can detect is 138 copies/mL. A negative result does not preclude SARS-Cov-2 infection and should not be used as the sole basis for treatment or other patient management decisions. A negative result may occur with  improper specimen collection/handling, submission of specimen other than nasopharyngeal swab, presence of viral mutation(s) within the areas targeted by this assay, and inadequate number of viral copies(<138 copies/mL). A negative result must be combined with clinical observations, patient history, and epidemiological information. The expected result is Negative.  Fact Sheet for Patients:  EntrepreneurPulse.com.au  Fact Sheet for Healthcare Providers:  IncredibleEmployment.be  This test is no t yet approved or cleared by the [place] FDA and  has been authorized for detection and/or diagnosis of SARS-CoV-2 by FDA under an Emergency Use Authorization (EUA). This EUA will remain  in effect (meaning this test can be used) for the duration of the COVID-19 declaration under Section  564(b)(1) of the Act, 21 U.S.C.section 360bbb-3(b)(1), unless the authorization is terminated  or revoked sooner.       Influenza A by PCR NEGATIVE NEGATIVE Final   Influenza B by PCR NEGATIVE NEGATIVE Final    Comment: (NOTE) The Xpert Xpress SARS-CoV-2/FLU/RSV plus assay is intended as an aid in the diagnosis of influenza from Nasopharyngeal swab specimens and should not be used as a sole basis for treatment. Nasal washings and aspirates are unacceptable for Xpert Xpress SARS-CoV-2/FLU/RSV testing.  Fact Sheet for Patients: BloggerCourse.com  Fact Sheet for Healthcare  Providers: SeriousBroker.it  This test is not yet approved or cleared by the [place] FDA and has been authorized for detection and/or diagnosis of SARS-CoV-2 by FDA under an Emergency Use Authorization (EUA). This EUA will remain in effect (meaning this test can be used) for the duration of the COVID-19 declaration under Section 564(b)(1) of the Act, 21 U.S.C. section 360bbb-3(b)(1), unless the authorization is terminated or revoked.  Performed at [facility], [address] [place], [place] [address]   Culture, blood (Routine X 2) w Reflex to ID Panel     Status: None (Preliminary result)   Collection Time: 01/05/21  5:42 PM   Specimen: BLOOD  Result Value Ref Range Status   Specimen Description BLOOD RIGHT ANTECUBITAL  Final   Special Requests   Final    BOTTLES DRAWN AEROBIC AND ANAEROBIC Blood Culture adequate volume   Culture   Final    NO GROWTH < 24 HOURS Performed at [facility], [address] [place], [place] [address]    Report Status PENDING  Incomplete  Culture, blood (Routine X 2) w Reflex to ID Panel     Status: None (Preliminary result)   Collection Time: 01/05/21  5:51 PM   Specimen: BLOOD  Result Value Ref Range Status   Specimen Description BLOOD RIGHT ANTECUBITAL  Final   Special Requests   Final    BOTTLES DRAWN AEROBIC AND ANAEROBIC Blood Culture adequate volume   Culture   Final    NO GROWTH < 24 HOURS Performed at [facility], [address] [place], [place] [address]    Report Status PENDING  Incomplete  MRSA Next Gen by PCR, Nasal     Status: None   Collection Time: 01/05/21  8:28 PM   Specimen: Nasal Mucosa; Nasal Swab  Result Value Ref Range Status   MRSA by PCR Next Gen NOT DETECTED NOT DETECTED Final    Comment: (NOTE) The GeneXpert MRSA Assay (FDA approved for NASAL specimens only), is one component of a comprehensive MRSA colonization surveillance program. It is not intended to  diagnose MRSA infection nor to guide or monitor treatment for MRSA infections. Test performance is not FDA approved in patients less than 26 years old. Performed at [facility], [address], [place], [place] [address]   CSF culture w Stat Gram Stain     Status: None (Preliminary result)   Collection Time: 01/06/21 10:40 AM   Specimen: CSF; Cerebrospinal Fluid  Result Value Ref Range Status   Specimen Description CSF  Final   Special Requests NONE  Final   Gram Stain   Final    CYTOSPIN SMEAR WBC PRESENT,BOTH PMN AND MONONUCLEAR GRAM POSITIVE COCCI IN CLUSTERS CRITICAL RESULT CALLED TO, READ BACK BY AND VERIFIED WITH: RN S.PAUSON AT 1243 ON 12/2020 BY T.SAAD. Performed at [facility], [address], [place], [place] [address]  Culture PENDING  Incomplete   Report Status PENDING  Incomplete     Serology:    Imaging: If present, new imagings (plain films, ct scans, and mri) have been personally visualized and interpreted; radiology reports have been reviewed. Decision making incorporated into the Impression / Recommendations.  12/20 head ct 1. No acute intracranial findings. 2. 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 Correlation with prior outside imaging of the brain is recommended to assess stability. If no prior imaging is available, a follow-up contrast enhanced MRI could be performed on a nonemergent basis.    12/21 cxr No active disease  Jabier Mutton, MD [facility] [place] [phone] pager    01/06/2021, 1:36 PM

## 2021-01-06 NOTE — Care Management (Signed)
°  Transition of Care [facility]) Screening Note   Patient Details  Name: Steve [name] Date of Birth: 1965/06/15   Transition of Care [facility]) CM/SW Contact:    Lawerance Sabal, RN Phone Number: 01/06/2021, 3:56 PM    Transition of Care Department [facility]) has reviewed patient. We will continue to monitor patient advancement through interdisciplinary progression rounds. If new patient transition needs arise, please place a TOC consult.

## 2021-01-07 ENCOUNTER — Inpatient Hospital Stay (HOSPITAL_COMMUNITY): Payer: Medicaid Other

## 2021-01-07 DIAGNOSIS — R509 Fever, unspecified: Secondary | ICD-10-CM

## 2021-01-07 LAB — CBC
HCT: 34.7 % — ABNORMAL LOW (ref 39.0–52.0)
Hemoglobin: 11.8 g/dL — ABNORMAL LOW (ref 13.0–17.0)
MCH: 32 pg (ref 26.0–34.0)
MCHC: 34 g/dL (ref 30.0–36.0)
MCV: 94 fL (ref 80.0–100.0)
Platelets: 196 10*3/uL (ref 150–400)
RBC: 3.69 MIL/uL — ABNORMAL LOW (ref 4.22–5.81)
RDW: 14.6 % (ref 11.5–15.5)
WBC: 10.9 10*3/uL — ABNORMAL HIGH (ref 4.0–10.5)
nRBC: 0 % (ref 0.0–0.2)

## 2021-01-07 LAB — RESPIRATORY PANEL BY PCR

## 2021-01-07 LAB — COMPREHENSIVE METABOLIC PANEL
ALT: 1051 U/L — ABNORMAL HIGH (ref 0–44)
AST: 1379 U/L — ABNORMAL HIGH (ref 15–41)
Albumin: 2.8 g/dL — ABNORMAL LOW (ref 3.5–5.0)
Alkaline Phosphatase: 64 U/L (ref 38–126)
Anion gap: 13 (ref 5–15)
BUN: 79 mg/dL — ABNORMAL HIGH (ref 6–20)
CO2: 15 mmol/L — ABNORMAL LOW (ref 22–32)
Calcium: 6.6 mg/dL — ABNORMAL LOW (ref 8.9–10.3)
Chloride: 110 mmol/L (ref 98–111)
Creatinine, Ser: 4.86 mg/dL — ABNORMAL HIGH (ref 0.61–1.24)
GFR, Estimated: 13 mL/min — ABNORMAL LOW (ref >60)
Glucose, Bld: 101 mg/dL — ABNORMAL HIGH (ref 70–99)
Potassium: 4.9 mmol/L (ref 3.5–5.1)
Sodium: 138 mmol/L (ref 135–145)
Total Bilirubin: 0.6 mg/dL (ref 0.3–1.2)
Total Protein: 5.6 g/dL — ABNORMAL LOW (ref 6.5–8.1)

## 2021-01-07 LAB — GLUCOSE, CAPILLARY
Glucose-Capillary: 99 mg/dL (ref 70–99)
Glucose-Capillary: 94 mg/dL (ref 70–99)
Glucose-Capillary: 87 mg/dL (ref 70–99)
Glucose-Capillary: 87 mg/dL (ref 70–99)
Glucose-Capillary: 85 mg/dL (ref 70–99)
Glucose-Capillary: 79 mg/dL (ref 70–99)

## 2021-01-07 LAB — CK: Total CK: 38455 U/L — ABNORMAL HIGH (ref 49–397)

## 2021-01-07 LAB — AMMONIA: Ammonia: 27 umol/L (ref 9–35)

## 2021-01-07 MED ORDER — ASPIRIN 325 MG PO TABS
325.0000 mg | ORAL_TABLET | Freq: Every day | ORAL | Status: DC
  Administered 2021-01-08 – 2021-05-14 (×126): 325 mg via ORAL
  Filled 2021-01-07 (×127): qty 1

## 2021-01-07 MED ORDER — ASPIRIN 300 MG RE SUPP
300.0000 mg | Freq: Every day | RECTAL | Status: DC
  Filled 2021-01-07 (×17): qty 1

## 2021-01-07 MED ORDER — VANCOMYCIN VARIABLE DOSE PER UNSTABLE RENAL FUNCTION (PHARMACIST DOSING)
Status: DC
Start: 2021-01-07 — End: 2021-01-07

## 2021-01-07 NOTE — Progress Notes (Addendum)
Subjective: No significant change  Exam: Vitals:   01/07/21 0800 01/07/21 0900  BP: 123/76 129/89  Pulse: (!) 59 (!) 59  Resp: (!) 36 (!) 22  Temp:    SpO2: 92% 93%   Gen: In bed, NAD Resp: non-labored breathing, no acute distress Abd: soft, nt  Neuro: MS: Awake, agitated, able to tell me his name, some perseveration.  He does follow simple commands, but not reliably. CN: Crosses midline in both directions, fixates and tracks.  Face symmetric. Motor: Moves all extremities spontaneously, does not cooperate with formal strength testing Sensory: Response to mild stimulation bilaterally  Pertinent Labs: BUN 79  Impression: 55 year old male with persistent altered mental status after presenting with reported seizure.  He reportedly had a history of seizures, and was on gabapentin prior to admission, but this has not been confirmed.  EEG/MRI are negative.  LP displays gram-positive cocci, but this is presumably a contaminant given no white cells and only mildly elevated protein.  At this point my suspicion is that his encephalopathy is more metabolic related to his renal failure, but it does seem more than I typically see for this degree of kidney dysfunction.  I think a prolonged EEG to look for seizures would still be prudent, though it may not be possible given his agitation.  The small infarct I think is incidental, possibly related to his hypertensive episode.  I would favor antiplatelet therapy, and we can check an LDL, though I would not favor starting statin therapy in the context of acute rhabdomyolysis.  Recommendations: 1) Longer EEG 2) continue Keppra 500 mg twice daily  Ritta Slot, MD [facility] [phone]  If 7pm- 7am, please page neurology on call as listed in AMION.

## 2021-01-07 NOTE — Progress Notes (Signed)
Steve [name] for Infectious Disease  Date of Admission:  01/05/2021     Abx: 12/20-22 vanc 12/21-22 ceftriaxone 12/20-21 amp/sulb                                                            Assessment: AMS Severe sepsis  Rhabdomyolysis Lactic acidosis Seizure   55 yo male relatively unknown in terms of medical problems admitted from a group home after found down by his room mate for unclear duration in setting of a few days prior to that event not "acting himself," found to have low grade fever/lactic acidosis/labs c/w rhabdo, and confusion, s/p LP with relatively normal appearing csf fluid but gram stain with gpc in clusters     12/20 bcx ngtd 12/21 tube 3 --> csf cx (gram stain gpc clusters); 1 wbc; 310s rbc; clear color; glucose 82; protein 54   Suspect cross contamination of gpc stain on csf which appears bland. Cx ngtd Bcx ngtd One brief episode fever at presentation. Wbc quickly down trending. No other obvious pyogenic focus Mri brain not suggestive of hsv encephalitis or meningitis  Cr unclear baseline but rather high, in setting ck elevation/rhabdo  Respiratory viral pcr negative  Doesn't appear at this time to be a bacterial infectious process  Plan: Discussed with primary team; will stop empiric antibiotics Please reengage ID if recurrent sepsis occurs  Principal Problem:   Acute metabolic encephalopathy Active Problems:   Severe sepsis without septic shock ([name])    Scheduled Meds:  aspirin  325 mg Oral Daily   Or   aspirin  300 mg Rectal Daily   Chlorhexidine Gluconate Cloth  6 each Topical Daily   heparin  5,000 Units Subcutaneous Q8H   Continuous Infusions:  dexmedetomidine (PRECEDEX) IV infusion 1.2 mcg/kg/hr (01/07/21 1200)   lactated ringers 250 mL/hr at 01/07/21 1200   levETIRAcetam Stopped (01/07/21 0611)   PRN Meds:.diphenhydrAMINE, docusate sodium, fentaNYL (SUBLIMAZE) injection, fentaNYL (SUBLIMAZE) injection, midazolam,  midazolam, polyethylene glycol   SUBJECTIVE: Improved mentation Fever had resolved after initial low grade temp on admission Wbc 24 --> 13 --> 11 Cr rising Ck down Lft down Respiratory viral pcr negative Csf cx ngtd Bcx negative  Review of Systems: ROS All other ROS was negative, except mentioned above     OBJECTIVE: Vitals:   01/07/21 1130 01/07/21 1200 01/07/21 1202 01/07/21 1230  BP: (!) 125/101 (!) 135/98 (!) 135/98 (!) 131/93  Pulse: 76 63 62 (!) 57  Resp: (!) 27 17 (!) 24 (!) 25  Temp:   97.6 F (36.4 C)   TempSrc:   Oral   SpO2: 94% 93% 93% 92%  Weight:      Height:       Body mass index is 24.74 kg/m.  Physical Exam General/constitutional: more alert but still very drowsy and not interacting with history/exam HEENT: Normocephalic, PER, Conj Clear Neck supple CV: rrr no mrg Lungs: clear to auscultation, normal respiratory effort Abd: Soft, Nontender Ext: no edema Skin: stable changes pressure related stage 1 sore on feet/elbow Neuro: no rigidity/tremor/clonus; limited exam otherwise due to mentation MSK: no peripheral joint swelling/tenderness/warmth  Lab Results Lab Results  Component Value Date   WBC 10.9 (H) 01/07/2021   HGB 11.8 (L) 01/07/2021   HCT 34.7 (  L) 01/07/2021   MCV 94.0 01/07/2021   PLT 196 01/07/2021    Lab Results  Component Value Date   CREATININE 4.86 (H) 01/07/2021   BUN 79 (H) 01/07/2021   NA 138 01/07/2021   K 4.9 01/07/2021   CL 110 01/07/2021   CO2 15 (L) 01/07/2021    Lab Results  Component Value Date   ALT 1,051 (H) 01/07/2021   AST 1,379 (H) 01/07/2021   ALKPHOS 64 01/07/2021   BILITOT 0.6 01/07/2021      Microbiology: Recent Results (from the past 240 hour(s))  Resp Panel by RT-PCR (Flu A&B, Covid) Nasopharyngeal Swab     Status: None   Collection Time: 01/05/21  2:28 PM   Specimen: Nasopharyngeal Swab; Nasopharyngeal(NP) swabs in vial transport medium  Result Value Ref Range Status   SARS Coronavirus  2 by RT PCR NEGATIVE NEGATIVE Final    Comment: (NOTE) SARS-CoV-2 target nucleic acids are NOT DETECTED.  The SARS-CoV-2 RNA is generally detectable in upper respiratory specimens during the acute phase of infection. The lowest concentration of SARS-CoV-2 viral copies this assay can detect is 138 copies/mL. A negative result does not preclude SARS-Cov-2 infection and should not be used as the sole basis for treatment or other patient management decisions. A negative result may occur with  improper specimen collection/handling, submission of specimen other than nasopharyngeal swab, presence of viral mutation(s) within the areas targeted by this assay, and inadequate number of viral copies(<138 copies/mL). A negative result must be combined with clinical observations, patient history, and epidemiological information. The expected result is Negative.  Fact Sheet for Patients:  EntrepreneurPulse.com.au  Fact Sheet for Healthcare Providers:  IncredibleEmployment.be  This test is no t yet approved or cleared by the [place] FDA and  has been authorized for detection and/or diagnosis of SARS-CoV-2 by FDA under an Emergency Use Authorization (EUA). This EUA will remain  in effect (meaning this test can be used) for the duration of the COVID-19 declaration under Section 564(b)(1) of the Act, 21 U.S.C.section 360bbb-3(b)(1), unless the authorization is terminated  or revoked sooner.       Influenza A by PCR NEGATIVE NEGATIVE Final   Influenza B by PCR NEGATIVE NEGATIVE Final    Comment: (NOTE) The Xpert Xpress SARS-CoV-2/FLU/RSV plus assay is intended as an aid in the diagnosis of influenza from Nasopharyngeal swab specimens and should not be used as a sole basis for treatment. Nasal washings and aspirates are unacceptable for Xpert Xpress SARS-CoV-2/FLU/RSV testing.  Fact Sheet for Patients: EntrepreneurPulse.com.au  Fact  Sheet for Healthcare Providers: IncredibleEmployment.be  This test is not yet approved or cleared by the [place] FDA and has been authorized for detection and/or diagnosis of SARS-CoV-2 by FDA under an Emergency Use Authorization (EUA). This EUA will remain in effect (meaning this test can be used) for the duration of the COVID-19 declaration under Section 564(b)(1) of the Act, 21 U.S.C. section 360bbb-3(b)(1), unless the authorization is terminated or revoked.  Performed at [facility], [address] [place], [place] [address]   Culture, blood (Routine X 2) w Reflex to ID Panel     Status: None (Preliminary result)   Collection Time: 01/05/21  5:42 PM   Specimen: BLOOD  Result Value Ref Range Status   Specimen Description BLOOD RIGHT ANTECUBITAL  Final   Special Requests   Final    BOTTLES DRAWN AEROBIC AND ANAEROBIC Blood Culture adequate volume   Culture   Final    NO  GROWTH 2 DAYS Performed at [facility], [address] [place], [place] [address]    Report Status PENDING  Incomplete  Culture, blood (Routine X 2) w Reflex to ID Panel     Status: None (Preliminary result)   Collection Time: 01/05/21  5:51 PM   Specimen: BLOOD  Result Value Ref Range Status   Specimen Description BLOOD RIGHT ANTECUBITAL  Final   Special Requests   Final    BOTTLES DRAWN AEROBIC AND ANAEROBIC Blood Culture adequate volume   Culture   Final    NO GROWTH 2 DAYS Performed at [facility], [address] [place], [place] [address]    Report Status PENDING  Incomplete  MRSA Next Gen by PCR, Nasal     Status: None   Collection Time: 01/05/21  8:28 PM   Specimen: Nasal Mucosa; Nasal Swab  Result Value Ref Range Status   MRSA by PCR Next Gen NOT DETECTED NOT DETECTED Final    Comment: (NOTE) The GeneXpert MRSA Assay (FDA approved for NASAL specimens only), is one component of a comprehensive MRSA colonization surveillance program. It is not  intended to diagnose MRSA infection nor to guide or monitor treatment for MRSA infections. Test performance is not FDA approved in patients less than 61 years old. Performed at [facility], [name] [place], [place] [address]   CSF culture w Stat Gram Stain     Status: None (Preliminary result)   Collection Time: 01/06/21 10:40 AM   Specimen: CSF; Cerebrospinal Fluid  Result Value Ref Range Status   Specimen Description CSF  Final   Special Requests NONE  Final   Gram Stain   Final    CYTOSPIN SMEAR WBC PRESENT,BOTH PMN AND MONONUCLEAR GRAM POSITIVE COCCI IN CLUSTERS CRITICAL RESULT CALLED TO, READ BACK BY AND VERIFIED WITH: RN S.PAUSON AT 1243 ON 12/2020 BY T.SAAD.    Culture   Final    NO GROWTH < 24 HOURS Performed at [facility], [address], [place], [place] [address]    Report Status PENDING  Incomplete  Respiratory (~20 pathogens) panel by PCR     Status: None   Collection Time: 01/07/21 10:24 AM   Specimen: Nasopharyngeal Swab; Respiratory  Result Value Ref Range Status   Adenovirus NOT DETECTED NOT DETECTED Final   Coronavirus 229E NOT DETECTED NOT DETECTED Final    Comment: (NOTE) The Coronavirus on the Respiratory Panel, DOES NOT test for the novel  Coronavirus (2019 nCoV)    Coronavirus HKU1 NOT DETECTED NOT DETECTED Final   Coronavirus NL63 NOT DETECTED NOT DETECTED Final   Coronavirus OC43 NOT DETECTED NOT DETECTED Final   Metapneumovirus NOT DETECTED NOT DETECTED Final   Rhinovirus / Enterovirus NOT DETECTED NOT DETECTED Final   Influenza A NOT DETECTED NOT DETECTED Final   Influenza B NOT DETECTED NOT DETECTED Final   Parainfluenza Virus 1 NOT DETECTED NOT DETECTED Final   Parainfluenza Virus 2 NOT DETECTED NOT DETECTED Final   Parainfluenza Virus 3 NOT DETECTED NOT DETECTED Final   Parainfluenza Virus 4 NOT DETECTED NOT DETECTED Final   Respiratory Syncytial Virus NOT DETECTED NOT DETECTED Final   Bordetella pertussis NOT DETECTED  NOT DETECTED Final   Bordetella Parapertussis NOT DETECTED NOT DETECTED Final   Chlamydophila pneumoniae NOT DETECTED NOT DETECTED Final   Mycoplasma pneumoniae NOT DETECTED NOT DETECTED Final    Comment: Performed at [facility], [address], [place], [place]  [id number]     Serology:   Imaging: If present, new imagings (plain films, ct scans, and mri) have been personally visualized and interpreted; radiology reports have been reviewed. Decision making incorporated into the Impression / Recommendations.  12/21 brain mri 1. 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 2. Mild chronic white matter microangiopathy. 3. 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 Jabier Mutton, [place] for Infectious [place] [phone] pager    01/07/2021, 1:52 PM

## 2021-01-07 NOTE — Progress Notes (Signed)
LTM EEG hooked up and running - no initial skin breakdown - push button tested - neuro notified. [facility] monitoring.  

## 2021-01-07 NOTE — Progress Notes (Signed)
Per Dr. Rutha Bouchard, droplet precautions can be discontinued.  Will continue to monitor patient.

## 2021-01-07 NOTE — Progress Notes (Signed)
NAME:  Steve [name], MRN:  [id number], DOB:  August 25, 1965, LOS: 2 ADMISSION DATE:  01/05/2021, CONSULTATION DATE:  12/20 REFERRING MD:  12/20, CHIEF COMPLAINT:  sepsis and AMS   History of Present Illness:  This is a 55 year old white male with limited medical history on presentation to the emergency room.  He was brought from EMS after being found at a group home.  Reportedly his roommate found him having full body convulsions, followed by altered sensorium and combativeness.  Per report from EMS roommate had been noticing the patient was not acting typical for the prior few days before. On arrival to ER was found to be slightly hypotensive with systolic blood pressure in the 90s, room air saturations low 90s, he was confused, and at times combative.  A CT head was obtained this was negative for acute bleeding but did show prominent fluid space at the left cerebral pontine angle raising possibility of epidermoid or arachnoid cyst. Initial lab work: Serum creatinine of 3.27, abnormal LFTs, white blood cell count 23.7, and lactic acid 7.6.  Blood cultures were sent, empiric antibiotic started, IV fluid resuscitation administered.  Because of multiple metabolic derangements, altered sensorium, and concern about seizure the critical care team was asked to admit.  Pertinent  Medical History  Formal history not available on time of admission.  Home medications notable for Prozac, Neurontin, hydroxyzine and trazodone Significant Hospital Events: Including procedures, antibiotic start and stop dates in addition to other pertinent events   12/20 admitted for acute encephalopathy concern for seizure activity with agitation requiring precedex gtt  Interim History / Subjective:  Overnight, persistent agitation for which precedex gtt increased.  This morning, patient sleeping but is arousable to physical stimuli; remains altered.    Objective   Blood pressure (!) 126/103, pulse 63, temperature 98.3 F (36.8  C), temperature source Axillary, resp. rate (!) 23, height 5' 8" (1.727 m), weight 73.8 kg, SpO2 94 %.        Intake/Output Summary (Last 24 hours) at 01/07/2021 0710 Last data filed at 01/07/2021 0600 Gross per 24 hour  Intake 5213.98 ml  Output --  Net 5213.98 ml   Filed Weights   01/05/21 1600 01/07/21 0415  Weight: 67.5 kg 73.8 kg    Physical Exam  Constitutional: Middle aged male, confused No distress.  HENT: Normocephalic and atraumatic, EOMI, anicteric sclerae Cardiovascular: RRR, S1 and S2 present, no m/r/g.  Distal pulses intact Respiratory: CTAB, on RA, no wheezing/rhonchi/rales GI: Nondistended, soft, +BS Musculoskeletal: Normal bulk and tone.  No peripheral edema noted. Neurological: somnolent but arousable to physical stimuli; spontaneously moving all extremities; neck supple; no apparent focal deficits  Skin: Warm and dry. Diffuse rash on bilat lower extremities  Resolved Hospital Problem list   Anion gap metabolic acidosis   Assessment & Plan:  Acute toxic metabolic encephalopathy Suspected to be in setting of seizure activity with postictal state. Did have fever on presentation; LP performed yesterday with gram stain significant for gram positive cocci in clusters; however, remainder of CSF analysis not consistent with bacterial meningitis. 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 EEG negative for seizure activity. Suspect encephalopathy may be metabolic in setting of renal failure.   - Neurology consulted, appreciate recommendations - ID consulted, appreciate recommendations  - Continue Keppra - cEEG - Serial neuro checks  - Seizure precautions  Acute renal failure Rhabdomyolysis Hyperkalemia-resolved No known history of renal disease  although no prior records can be found. CK>50,000 on presentation. sCr 3.27>3.79>4.86 with minimal UOP. UA with hemoglobinuria consistent  with rhabdomyolysis. Pt has received ~9L LR with only 350cc UOP. Renal US without hydronephrosis; renal failure likely from ongoing rhabdo vs ATN.  - IV LR 250cc/hr - Trend renal function - Will likely need CRRT if no improvement in renal function by tomorrow  - Strict I&O  Sepsis 2/2 aspiration PNA Patient initially hypotensive on presentation with mild fever. CXR with concerns of aspiration PNA for which patient given vanc/cefepime and started on Unasyn. Remains afebrile and leukocytosis improving. Blood cx neg to date - Continue IV Unasyn  - F/u blood cultures  - Trend CBC  Non-anion gap metabolic acidosis  AG resolved; NAGMA likely in setting of renal failure  - Trend BMP  Acute liver injury AST/ALT elevated with normal Alk phos and T.bili. This is likely 2/2 rhabdomyolysis. Stabl;e this AM.  - Trend LFTs   Normocytic anemia In setting of acute illness vs hemodilution from fluid resuscitation. No signs of bleeding at this time  - Trend CBC - Iron studies when past acute illness   Abnormal Echo Echo with tricuspid valve regurgitation and right ventricular volume overload with evidence of atrial level shunting concerning for ASD. Also concerns for constrictive pericarditis.  - F/u with TEE or cardiac MRI once stable   Best Practice (right click and "Reselect all SmartList Selections" daily)   Diet/type: NPO DVT prophylaxis: prophylactic heparin  GI prophylaxis: N/A Lines: N/A Foley:  N/A Code Status:  full code Last date of multidisciplinary goals of care discussion [no family listed, pt altered - SW consult to assist with collateral hx and finding family]  Labs   CBC: Recent Labs  Lab 01/05/21 1342 01/05/21 1538 01/06/21 0550 01/07/21 0247  WBC 23.7* 20.3* 12.9* 10.9*  HGB 15.1 13.6 12.0* 11.8*  HCT 46.8 41.1 35.8* 34.7*  MCV 99.8 99.3 96.2 94.0  PLT 382 321 218 694    Basic Metabolic Panel: Recent Labs  Lab 01/05/21 1342 01/05/21 1538 01/06/21 0550  01/06/21 1402 01/07/21 0247  NA 141  --  140 142 138  K 4.8  --  5.3* 5.2* 4.9  CL 103  --  109 112* 110  CO2 19*  --  20* 19* 15*  GLUCOSE 122*  --  132* 110* 101*  BUN 32*  --  59* 67* 79*  CREATININE 3.27* 3.04* 3.79* 3.98* 4.86*  CALCIUM 8.0*  --  6.5* 6.6* 6.6*   GFR: Estimated Creatinine Clearance: 16.6 mL/min (A) (by C-G formula based on SCr of 4.86 mg/dL (H)). Recent Labs  Lab 01/05/21 1342 01/05/21 1441 01/05/21 1538 01/05/21 1751 01/05/21 2137 01/06/21 0550 01/07/21 0247  PROCALCITON  --   --   --   --   --  19.81  --   WBC 23.7*  --  20.3*  --   --  12.9* 10.9*  LATICACIDVEN  --  7.6* 2.8* 4.0* 2.4*  --   --     Liver Function Tests: Recent Labs  Lab 01/05/21 1342 01/06/21 0550 01/06/21 1402 01/07/21 0247  AST 1,267* 1,688* 1,608* 1,379*  ALT 893* 877* 952* 1,051*  ALKPHOS 90 55 55 64  BILITOT 1.0 0.9 0.9 0.6  PROT 8.0 5.9* 5.5* 5.6*  ALBUMIN 4.1 3.2* 2.9* 2.8*   No results for input(s): LIPASE, AMYLASE in the last 168 hours. No results for input(s): AMMONIA in the last 168 hours.  ABG No results found for:  PHART, PCO2ART, PO2ART, HCO3, TCO2, ACIDBASEDEF, O2SAT   Coagulation Profile: Recent Labs  Lab 01/05/21 1342  INR 1.2    Cardiac Enzymes: Recent Labs  Lab 01/05/21 1751  CKTOTAL >50,000*    HbA1C: No results found for: HGBA1C  CBG: Recent Labs  Lab 01/06/21 0732 01/06/21 1121 01/06/21 1919 01/06/21 2310 01/07/21 0310  GLUCAP 132* 127* 97 87 99    Critical care time:

## 2021-01-07 NOTE — Progress Notes (Signed)
Pharmacy Antibiotic Note  Steve [name] is a 55 y.o. male admitted on 01/05/2021 with meningitis.  Renal fx significantly declined overnight  Plan: Hold further vanc 24 hr lvl this evening to guide future doses   Height: 5\' 8"  (172.7 cm) Weight: 73.8 kg (162 lb 11.2 oz) IBW/kg (Calculated) : 68.4  Temp (24hrs), Avg:97.7 F (36.5 C), Min:97.4 F (36.3 C), Max:98.3 F (36.8 C)  Recent Labs  Lab 01/05/21 1342 01/05/21 1441 01/05/21 1538 01/05/21 1751 01/05/21 2137 01/06/21 0550 01/06/21 1402 01/07/21 0247  WBC 23.7*  --  20.3*  --   --  12.9*  --  10.9*  CREATININE 3.27*  --  3.04*  --   --  3.79* 3.98* 4.86*  LATICACIDVEN  --  7.6* 2.8* 4.0* 2.4*  --   --   --      Estimated Creatinine Clearance: 16.6 mL/min (A) (by C-G formula based on SCr of 4.86 mg/dL (H)).    Not on File  Antimicrobials this admission: 12/20 Unasyn >>  12/21 Vanco >>   Dose adjustments this admission:   Microbiology results: 12/20 BCx: IP 12/21 LP: 1/22, PharmD, BCCCP Clinical Pharmacist [phone]  Please check AMION for all [facility] Pharmacy numbers  01/07/2021 8:04 AM

## 2021-01-07 NOTE — Progress Notes (Signed)
vLTM monitoring discontinued  No skin breakdown noted at all skin sites.  [facility] notified

## 2021-01-08 LAB — MISC LABCORP TEST (SEND OUT): Labcorp test code: 9985

## 2021-01-08 LAB — COMPREHENSIVE METABOLIC PANEL
ALT: 712 U/L — ABNORMAL HIGH (ref 0–44)
AST: 739 U/L — ABNORMAL HIGH (ref 15–41)
Albumin: 2.4 g/dL — ABNORMAL LOW (ref 3.5–5.0)
Alkaline Phosphatase: 63 U/L (ref 38–126)
Anion gap: 14 (ref 5–15)
BUN: 85 mg/dL — ABNORMAL HIGH (ref 6–20)
CO2: 13 mmol/L — ABNORMAL LOW (ref 22–32)
Calcium: 6.9 mg/dL — ABNORMAL LOW (ref 8.9–10.3)
Chloride: 111 mmol/L (ref 98–111)
Creatinine, Ser: 6.17 mg/dL — ABNORMAL HIGH (ref 0.61–1.24)
GFR, Estimated: 10 mL/min — ABNORMAL LOW (ref >60)
Glucose, Bld: 88 mg/dL (ref 70–99)
Potassium: 4.8 mmol/L (ref 3.5–5.1)
Sodium: 138 mmol/L (ref 135–145)
Total Bilirubin: 1.4 mg/dL — ABNORMAL HIGH (ref 0.3–1.2)
Total Protein: 5.3 g/dL — ABNORMAL LOW (ref 6.5–8.1)

## 2021-01-08 LAB — LIPID PANEL
Cholesterol: 127 mg/dL (ref 0–200)
HDL: 21 mg/dL — ABNORMAL LOW (ref >40)
LDL Cholesterol: 73 mg/dL (ref 0–99)
Total CHOL/HDL Ratio: 6 RATIO
Triglycerides: 163 mg/dL — ABNORMAL HIGH (ref <150)
VLDL: 33 mg/dL (ref 0–40)

## 2021-01-08 LAB — CBC
HCT: 32.6 % — ABNORMAL LOW (ref 39.0–52.0)
Hemoglobin: 11.5 g/dL — ABNORMAL LOW (ref 13.0–17.0)
MCH: 33 pg (ref 26.0–34.0)
MCHC: 35.3 g/dL (ref 30.0–36.0)
MCV: 93.7 fL (ref 80.0–100.0)
Platelets: 163 10*3/uL (ref 150–400)
RBC: 3.48 MIL/uL — ABNORMAL LOW (ref 4.22–5.81)
RDW: 14.3 % (ref 11.5–15.5)
WBC: 10.4 10*3/uL (ref 4.0–10.5)
nRBC: 0 % (ref 0.0–0.2)

## 2021-01-08 LAB — GLUCOSE, CAPILLARY
Glucose-Capillary: 86 mg/dL (ref 70–99)
Glucose-Capillary: 79 mg/dL (ref 70–99)
Glucose-Capillary: 75 mg/dL (ref 70–99)
Glucose-Capillary: 74 mg/dL (ref 70–99)
Glucose-Capillary: 114 mg/dL — ABNORMAL HIGH (ref 70–99)

## 2021-01-08 LAB — PROTEIN / CREATININE RATIO, URINE
Creatinine, Urine: 83.44 mg/dL
Protein Creatinine Ratio: 1.01 mg/mg{Cre} — ABNORMAL HIGH (ref 0.00–0.15)
Total Protein, Urine: 84 mg/dL

## 2021-01-08 LAB — HSV 1/2 PCR, CSF
HSV-1 DNA: NEGATIVE
HSV-2 DNA: NEGATIVE

## 2021-01-08 LAB — LACTIC ACID, PLASMA: Lactic Acid, Venous: 1.9 mmol/L (ref 0.5–1.9)

## 2021-01-08 LAB — CK: Total CK: 23371 U/L — ABNORMAL HIGH (ref 49–397)

## 2021-01-08 MED ORDER — SODIUM CHLORIDE 0.9 % IV SOLN
100.0000 mg | Freq: Two times a day (BID) | INTRAVENOUS | Status: DC
  Administered 2021-01-08 – 2021-01-25 (×36): 100 mg via INTRAVENOUS
  Filled 2021-01-08 (×40): qty 10

## 2021-01-08 MED ORDER — DIVALPROEX SODIUM 125 MG PO CSDR: 750.0000 mg | DELAYED_RELEASE_CAPSULE | Freq: Two times a day (BID) | ORAL | Status: DC

## 2021-01-08 MED ORDER — LEVETIRACETAM 500 MG PO TABS: 500.0000 mg | ORAL_TABLET | Freq: Two times a day (BID) | ORAL | Status: DC

## 2021-01-08 MED ORDER — DIVALPROEX SODIUM 500 MG PO DR TAB: 1500.0000 mg | DELAYED_RELEASE_TABLET | Freq: Once | ORAL | Status: DC

## 2021-01-08 MED ORDER — QUETIAPINE FUMARATE 25 MG PO TABS
25.0000 mg | ORAL_TABLET | Freq: Two times a day (BID) | ORAL | Status: DC
  Administered 2021-01-08 (×2): 25 mg via ORAL
  Filled 2021-01-08 (×2): qty 1

## 2021-01-08 MED ORDER — POLYETHYLENE GLYCOL 3350 17 G PO PACK
17.0000 g | PACK | Freq: Every day | ORAL | Status: DC
  Administered 2021-01-10 – 2021-05-11 (×59): 17 g via ORAL
  Filled 2021-01-08 (×92): qty 1

## 2021-01-08 MED ORDER — DOCUSATE SODIUM 100 MG PO CAPS
100.0000 mg | ORAL_CAPSULE | Freq: Two times a day (BID) | ORAL | Status: DC
  Administered 2021-01-08 – 2021-05-13 (×214): 100 mg via ORAL
  Filled 2021-01-08 (×233): qty 1

## 2021-01-08 MED ORDER — SODIUM CHLORIDE 0.9 % IV SOLN: INTRAVENOUS | Status: DC | PRN

## 2021-01-08 MED ORDER — CALCIUM GLUCONATE-NACL 1-0.675 GM/50ML-% IV SOLN
1.0000 g | Freq: Once | INTRAVENOUS | Status: AC
  Administered 2021-01-08: 15:00:00 1000 mg via INTRAVENOUS
  Filled 2021-01-08: qty 50

## 2021-01-08 MED ORDER — SODIUM BICARBONATE 8.4 % IV SOLN
INTRAVENOUS | Status: DC
  Filled 2021-01-08 (×10): qty 1000

## 2021-01-08 NOTE — Consult Note (Signed)
[facility] Renal Consultation Note  Requesting MD: Durel Salts, MD Indication for Consultation: AKI   Chief complaint: found down at group home   HPI:  Steve [name] is a 55 y.o. male  with limited known past medical history who presented to the ER after EMS was called to his group home.  EMS reported that his roommate found him having full body convulsions and then he had altered mental status and was combative.  Also per chart review EMS indicates that he roommate had reported atypical behavior a few days prior as well.  Though per charting he was found to have, it is not sure how long he was down.  He was found to have AKI which is progressively worsening and has also had a fever and was found to have rhabdomyolysis.  He had an LP which had gram stain with GPC in clusters per ID consult - they are concerned may have been a contaminant.  He was initiated on antibiotics but infectious disease has stopped empiric antibiotics as of 12/22 note.  Earlier in his course he received vanc, cefepime, unasyn, and ceftriaxone since his admission.  They were concerned that he may have a viral process with rhabdo and seizure.  He had 750 ml UOP over 12/22. Renal US on 12/21 with no hydro and echogenicity within normal limits.  He is covid negative.  He has been on LR at 250 ml/hr.  Yesterday foley was inserted and he had immediate return of urine with 500 ml over a 12 hour period per nursing.    History is limited - he states he cannot remember when asked many questions and he states year is 1515.  We do not have the name of the group home he is in.  We do not have information about next of kin.   Creatinine, Ser  Date/Time Value Ref Range Status  01/08/2021 12:56 AM 6.17 (H) 0.61 - 1.24 mg/dL Final  80/99/8338 25:05 AM 4.86 (H) 0.61 - 1.24 mg/dL Final  39/76/7341 93:79 PM 3.98 (H) 0.61 - 1.24 mg/dL Final  02/40/9735 32:99 AM 3.79 (H) 0.61 - 1.24 mg/dL Final  24/26/8341 96:22 PM 3.04 (H) 0.61  - 1.24 mg/dL Final  29/79/8921 19:41 PM 3.27 (H) 0.61 - 1.24 mg/dL Final     PMHx:   Unable to obtain 2/2 ams  Family Hx:  Unable to obtain 2/2 ams  Social History:  Unable to obtain secondary to ams.  He is able to say that he "doesn't smoke that much"  Allergies: Not on File; unable to obtain secondary to ams   Medications: Prior to Admission medications   Medication Sig Start Date End Date Taking? Authorizing Provider  FLUoxetine (PROZAC) 20 MG capsule Take 40 mg by mouth daily. 12/27/20   [provider]  gabapentin (NEURONTIN) 400 MG capsule Take 400 mg by mouth 3 (three) times daily. 11/27/20   [provider]  hydrOXYzine (ATARAX) 50 MG tablet Take 50 mg by mouth every 4 (four) hours as needed for anxiety. 11/27/20   [provider]  traZODone (DESYREL) 50 MG tablet Take 50 mg by mouth at bedtime as needed for sleep. 11/13/20   [provider]    I have reviewed the patient's current and reported prior to admission medications.  Labs:  BMP Latest Ref Rng & Units 01/08/2021 01/07/2021 01/06/2021  Glucose 70 - 99 mg/dL 88 740(C) 144(Y)  BUN 6 - 20 mg/dL 18(H) 63(J) 49(F)  Creatinine 0.61 - 1.24 mg/dL  6.17(H) 4.86(H) 3.98(H)  Sodium 135 - 145 mmol/L 138 138 142  Potassium 3.5 - 5.1 mmol/L 4.8 4.9 5.2(H)  Chloride 98 - 111 mmol/L 111 110 112(H)  CO2 22 - 32 mmol/L 13(L) 15(L) 19(L)  Calcium 8.9 - 10.3 mg/dL 6.9(L) 6.6(L) 6.6(L)    Urinalysis    Component Value Date/Time   COLORURINE AMBER (A) 01/05/2021 1755   APPEARANCEUR CLOUDY (A) 01/05/2021 1755   LABSPEC 1.010 01/05/2021 1755   PHURINE 5.0 01/05/2021 1755   GLUCOSEU NEGATIVE 01/05/2021 1755   HGBUR LARGE (A) 01/05/2021 1755   BILIRUBINUR NEGATIVE 01/05/2021 1755   KETONESUR NEGATIVE 01/05/2021 1755   PROTEINUR 100 (A) 01/05/2021 1755   NITRITE NEGATIVE 01/05/2021 1755   LEUKOCYTESUR NEGATIVE 01/05/2021 1755     ROS:  Unable to obtain secondary to AMS  Physical  Exam: Vitals:   01/08/21 1132 01/08/21 1200  BP: (!) 131/117 123/79  Pulse: 63 (!) 57  Resp: (!) 22 15  Temp: (!) 97.4 F (36.3 C)   SpO2: 97% 98%     General: adult male in bed altered HEENT: NCAT Eyes: EOMI Neck: supple trachea midline Heart: S1S2 no rub Lungs: clear and unlabored on room air  Abdomen: soft/nt/nd Extremities: no edema appreciated; no cyanosis or clubbing Skin: no rash on extremities exposed Neuro: oriented to self but not location or time "year is 1515" GU foley in place without urine   Assessment/Plan:  # AKI  - Secondary to rhabdomyolysis.  Large Hb and 0-5 RBC. 100 mg/dL protein - has been on fluids for rhabdo - transition to bicarb gtt  - check up/cr ratio  - defer aspirin to primary team  - continue foley  - change to renal diet - no emergent indication for dialysis though he is headed that direction if he does not plateau; he is altered with no family contact known (they do not actually even have name of his group home) so if dialysis is needed would be emergent consent   # Rhabdomyolysis  - transition to bicarb gtt  # Metabolic acidosis  - setting of AKI  - will update lactic acid and would obtain an ABG  - lactic acid elevated on admission and trended down from 7.6 to 2.4  # Seizure  - per neurology and critical care    # Encephalopathy  - setting of seizure and aki and initially covered with abx as concern for infection - appreciate primary team and neuro  - optimize renal function as above  # Transaminitis  - improving; hopefully renal function will follow  # Hypocalcemia - improves with correction for albumin  - giving calcium IV as we are starting bicarb gtt   Estanislado Emms 01/08/2021, 1:23 PM

## 2021-01-08 NOTE — Progress Notes (Signed)
Subjective: No significant change  Exam: Vitals:   01/08/21 0930 01/08/21 1000  BP: 137/77 129/78  Pulse: (!) 50 60  Resp: (!) 25 (!) 25  Temp:    SpO2: 95% 96%   Gen: In bed, NAD Resp: non-labored breathing, no acute distress Abd: soft, nt  Neuro: MS: Awake, agitated, able to tell me his name, some perseveration.  He does follow simple commands, but not reliably. CN: Crosses midline in both directions, fixates and tracks.  Face symmetric. Motor: Moves all extremities spontaneously, does not cooperate with formal strength testing Sensory: Response to mild stimulation bilaterally  Pertinent Labs: BUN 79  Impression: 55 year old male with persistent altered mental status after presenting with reported seizure.  He reportedly had a history of seizures, and was on gabapentin prior to admission, but this has not been confirmed.  EEG/MRI are negative.  LP displays gram-positive cocci, but this is presumably a contaminant given no white cells and only mildly elevated protein.  At this point my suspicion is that his encephalopathy is more metabolic related to his renal failure.  The small infarct I think is incidental, possibly related to his hypertensive episode.  I would favor antiplatelet therapy, and though his LDL is barely > 70, I would not favor statin given rhabdo.   Recommendations: 1) Change keppra to vimpat 100mg  BID given renal failure and possibility of keppra contributing to agiation.   , MD [facility] [phone]  If 7pm- 7am, please page neurology on call as listed in AMION.

## 2021-01-08 NOTE — Progress Notes (Signed)
eLink Physician-Brief Progress Note Patient Name: Steve [name] DOB: [birth year] MRN: [id number]   Date of Service  01/08/2021  HPI/Events of Note  Notified of calcium 6.9 Corrected calcium 8.2 CPK 23,371 ongoing fluids Creatinine 6.17  eICU Interventions  Holding off calcium correction as hypocalcemia mild and there is risk of intramuscular calcium deposition in patients with rhabdo     Intervention Category Intermediate Interventions: Electrolyte abnormality - evaluation and management  Darl Pikes 01/08/2021, 3:45 AM

## 2021-01-08 NOTE — Progress Notes (Signed)
eLink Physician-Brief Progress Note Patient Name: Steve [name] DOB: 18-Jul-1965 MRN: [id number]   Date of Service  01/08/2021  HPI/Events of Note  Received request for restraints Patient seen trying to get out of bed Precedex about to be started Patient appears uncomfortable with foley cath. Minimal UO reportedly  eICU Interventions  Ordered bilateral wrist restraints. Already has Posey belt on. Had bedside team check patency of Foley cath and bladder scan to make sure no urinary retention.     Intervention Category Minor Interventions: Agitation / anxiety - evaluation and management  Darl Pikes 01/08/2021, 8:28 PM

## 2021-01-08 NOTE — Progress Notes (Signed)
eLink Physician-Brief Progress Note Patient Name: Steve [name] DOB: [birth year] MRN: [id number]   Date of Service  01/08/2021  HPI/Events of Note  Pt needing soft ankle restraints now, Precedex maxed at 1.2 and receiving PRN fentanyl 100 mcg IVP and still trying to climb OOB.  eICU Interventions  Bilateral soft ankle restraints ordered Bedside team to assess in am if restraints to be continued      Intervention Category Minor Interventions: Agitation / anxiety - evaluation and management  Darl Pikes 01/08/2021, 9:37 PM

## 2021-01-08 NOTE — Progress Notes (Signed)
NAME:  Steve [name], MRN:  [id number], DOB:  [birth year], LOS: 3 ADMISSION DATE:  01/05/2021, CONSULTATION DATE:  12/20 REFERRING MD:  12/20, CHIEF COMPLAINT:  sepsis and AMS   History of Present Illness:  This is a 55 year old white male with limited medical history on presentation to the emergency room.  He was brought from EMS after being found at a group home.  Reportedly his roommate found him having full body convulsions, followed by altered sensorium and combativeness.  Per report from EMS roommate had been noticing the patient was not acting typical for the prior few days before. On arrival to ER was found to be slightly hypotensive with systolic blood pressure in the 90s, room air saturations low 90s, he was confused, and at times combative.  A CT head was obtained this was negative for acute bleeding but did show prominent fluid space at the left cerebral pontine angle raising possibility of epidermoid or arachnoid cyst. Initial lab work: Serum creatinine of 3.27, abnormal LFTs, white blood cell count 23.7, and lactic acid 7.6.  Blood cultures were sent, empiric antibiotic started, IV fluid resuscitation administered.  Because of multiple metabolic derangements, altered sensorium, and concern about seizure the critical care team was asked to admit.  Pertinent  Medical History  Formal history not available on time of admission.  Home medications notable for Prozac, Neurontin, hydroxyzine and trazodone Significant Hospital Events: Including procedures, antibiotic start and stop dates in addition to other pertinent events   12/20 admitted for acute encephalopathy concern for seizure activity with agitation requiring precedex gtt 12/21 LP shows Staph in CSF - felt to be contaminant 12/21 - spot EEG no seizures 12/22 continuous EEG no seizures  Interim History / Subjective:  No overnight issues. Still very impulsive, on precedex  Objective   Blood pressure 123/75, pulse (!) 51,  temperature 98.2 F (36.8 C), temperature source Axillary, resp. rate (!) 26, height 5' 8" (1.727 m), weight 71.7 kg, SpO2 96 %.        Intake/Output Summary (Last 24 hours) at 01/08/2021 0945 Last data filed at 01/08/2021 0900 Gross per 24 hour  Intake 6031.62 ml  Output 500 ml  Net 5531.62 ml   Filed Weights   01/05/21 1600 01/07/21 0415 01/08/21 0500  Weight: 67.5 kg 73.8 kg 71.7 kg    Physical Exam  Gen:      No acute distress, delirious, RASS +1 to +2 Lungs:    Clear to auscultation bilaterally; normal respiratory effort CV:         Regular rate and rhythm; no murmurs Abd:      + bowel sounds; soft, non-tender; no palpable masses, no distension Ext:    No edema; adequate peripheral perfusion Skin:      Warm and dry; no rash Neuro:    alert and oriented to self only. Delirious and impulsive but redirectable  Resolved Hospital Problem list   Anion gap metabolic acidosis   Assessment & Plan:  Acute toxic metabolic encephalopathy Suspected to be in setting of seizure activity with postictal state. Did have fever on presentation; LP performed yesterday with gram stain significant for gram positive cocci in clusters; however, remainder of CSF analysis not consistent with bacterial meningitis. 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 EEG negative for seizure activity. Suspect encephalopathy may be metabolic in setting of renal failure.   - Neurology consulted, appreciate recommendations.  - infectious  work up unremarkable thus far - Continue Keppra - talk with neurology about discontinuation - cEEG negative for seizures - schedule seroqeul for encephalopathy, try to taper down precedex.   Acute renal failure Rhabdomyolysis Non-anion gap metabolic acidosis  No known history of renal disease although no prior records can be found. CK>50,000 on presentation. sCr 3.27>3.79>4.86 with minimal UOP.  UA with hemoglobinuria consistent with rhabdomyolysis. Pt has received ~9L LR with only 350cc UOP. Renal US without hydronephrosis; renal failure likely from ongoing rhabdo vs ATN.  - continue IV LR 250cc/hr - Trend renal function - upgoing with oliguria. Consult nephrology. - Strict I&O - trend BMP  Sepsis 2/2 aspiration PNA Patient initially hypotensive on presentation with mild fever. CXR with concerns of aspiration PNA for which patient given vanc/cefepime and started on Unasyn. Remains afebrile and leukocytosis improving. Blood cx neg to date - complete 5 days total Unasyn  - blood cultures reviewed NGTD - Trend CBC  Acute liver injury AST/ALT elevated with normal Alk phos and T.bili. This is likely 2/2 rhabdomyolysis.  - Trend LFTs, gradually improving  Normocytic anemia In setting of acute illness vs hemodilution from fluid resuscitation. No signs of bleeding at this time  - Trend CBC - Iron studies when past acute illness   Abnormal Echo Echo with tricuspid valve regurgitation and right ventricular volume overload with evidence of atrial level shunting concerning for ASD. Also concerns for constrictive pericarditis - this all may be related to artifact as well. Consider repeat TTE when stable, from encephalopathy  Best Practice (right click and "Reselect all SmartList Selections" daily)   Diet/type: NPO DVT prophylaxis: prophylactic heparin  GI prophylaxis: N/A Lines: N/A Foley:  N/A Code Status:  full code Last date of multidisciplinary goals of care discussion [no family listed, pt altered - SW consult to assist with collateral hx and finding family]  The patient is critically ill due to encephalopathy.  Critical care was necessary to treat or prevent imminent or life-threatening deterioration.  Critical care was time spent personally by me on the following activities: development of treatment plan with patient and/or surrogate as well as nursing, discussions with  consultants, evaluation of patient's response to treatment, examination of patient, obtaining history from patient or surrogate, ordering and performing treatments and interventions, ordering and review of laboratory studies, ordering and review of radiographic studies, pulse oximetry, re-evaluation of patient's condition and participation in multidisciplinary rounds.   Critical Care Time devoted to patient care services described in this note is 33 minutes. This time reflects time of care of this [facility] . This critical care time does not reflect separately billable procedures or procedure time, teaching time or supervisory time of PA/NP/Med student/Med Resident etc but could involve care discussion time.       Spero Geralds [facility] 01/08/2021 9:45 AM  Pager: see AMION  If no response to pager , please call critical care on call (see AMION) until 7pm After 7:00 pm call Elink     Labs   CBC: Recent Labs  Lab 01/05/21 1342 01/05/21 1538 01/06/21 0550 01/07/21 0247 01/08/21 0056  WBC 23.7* 20.3* 12.9* 10.9* 10.4  HGB 15.1 13.6 12.0* 11.8* 11.5*  HCT 46.8 41.1 35.8* 34.7* 32.6*  MCV 99.8 99.3 96.2 94.0 93.7  PLT 382 321 218 196 174    Basic Metabolic Panel: Recent Labs  Lab 01/05/21 1342 01/05/21 1538 01/06/21 0550 01/06/21 1402 01/07/21 0247 01/08/21 0056  NA 141  --  140 142 138 138  K 4.8  --  5.3* 5.2* 4.9 4.8  CL 103  --  109 112* 110 111  CO2 19*  --  20* 19* 15* 13*  GLUCOSE 122*  --  132* 110* 101* 88  BUN 32*  --  59* 67* 79* 85*  CREATININE 3.27* 3.04* 3.79* 3.98* 4.86* 6.17*  CALCIUM 8.0*  --  6.5* 6.6* 6.6* 6.9*   GFR: Estimated Creatinine Clearance: 13.1 mL/min (A) (by C-G formula based on SCr of 6.17 mg/dL (H)). Recent Labs  Lab 01/05/21 1441 01/05/21 1538 01/05/21 1751 01/05/21 2137 01/06/21 0550 01/07/21 0247 01/08/21 0056  PROCALCITON  --   --   --   --  19.81  --   --   WBC  --  20.3*  --    --  12.9* 10.9* 10.4  LATICACIDVEN 7.6* 2.8* 4.0* 2.4*  --   --   --     Liver Function Tests: Recent Labs  Lab 01/05/21 1342 01/06/21 0550 01/06/21 1402 01/07/21 0247 01/08/21 0056  AST 1,267* 1,688* 1,608* 1,379* 739*  ALT 893* 877* 952* 1,051* 712*  ALKPHOS 90 55 55 64 63  BILITOT 1.0 0.9 0.9 0.6 1.4*  PROT 8.0 5.9* 5.5* 5.6* 5.3*  ALBUMIN 4.1 3.2* 2.9* 2.8* 2.4*   No results for input(s): LIPASE, AMYLASE in the last 168 hours. Recent Labs  Lab 01/07/21 1214  AMMONIA 27    ABG No results found for: PHART, PCO2ART, PO2ART, HCO3, TCO2, ACIDBASEDEF, O2SAT   Coagulation Profile: Recent Labs  Lab 01/05/21 1342  INR 1.2    Cardiac Enzymes: Recent Labs  Lab 01/05/21 1751 01/07/21 0247 01/08/21 0056  CKTOTAL >50,000* 38,455* 23,371*    HbA1C: No results found for: HGBA1C  CBG: Recent Labs  Lab 01/07/21 1508 01/07/21 1911 01/07/21 2312 01/08/21 0302 01/08/21 0717  GLUCAP 87 85 79 86 79

## 2021-01-08 NOTE — Procedures (Signed)
Patient Name: Steve [name]  MRN: [id number]  Epilepsy Attending: Charlsie Quest  Referring Physician/Provider: Dr Ritta Slot Duration: 01/07/2021 1103 to 1710   Patient history: 55 yo male who presents from a group home after witnessed generalized seizure activity with combativeness afterward.  EEG to evaluate for seizure.   Level of alertness: Awake   AEDs during EEG study: LEV   Technical aspects: This EEG study was done with scalp electrodes positioned according to the 10-20 International system of electrode placement. Electrical activity was acquired at a sampling rate of 500Hz  and reviewed with a high frequency filter of 70Hz  and a low frequency filter of 1Hz . EEG data were recorded continuously and digitally stored.    Description: No posterior dominant rhythm was seen.  EEG showed continuous generalized 3 to 5 Hz theta-delta slowing.  Hyperventilation and photic stimulation were not performed.     Patient pulled his EEG electrodes after 1710.   ABNORMALITY - Continuous slow, generalized   IMPRESSION: This study is suggestive of moderate diffuse encephalopathy, nonspecific etiology. No seizures or epileptiform discharges were seen throughout the recording.   Steve [name] 

## 2021-01-09 ENCOUNTER — Inpatient Hospital Stay (HOSPITAL_COMMUNITY): Payer: Medicaid Other

## 2021-01-09 DIAGNOSIS — N19 Unspecified kidney failure: Secondary | ICD-10-CM

## 2021-01-09 DIAGNOSIS — G9349 Other encephalopathy: Secondary | ICD-10-CM

## 2021-01-09 LAB — BASIC METABOLIC PANEL
Anion gap: 15 (ref 5–15)
BUN: 91 mg/dL — ABNORMAL HIGH (ref 6–20)
CO2: 17 mmol/L — ABNORMAL LOW (ref 22–32)
Calcium: 7.2 mg/dL — ABNORMAL LOW (ref 8.9–10.3)
Chloride: 110 mmol/L (ref 98–111)
Creatinine, Ser: 7.26 mg/dL — ABNORMAL HIGH (ref 0.61–1.24)
GFR, Estimated: 8 mL/min — ABNORMAL LOW (ref >60)
Glucose, Bld: 128 mg/dL — ABNORMAL HIGH (ref 70–99)
Potassium: 4.6 mmol/L (ref 3.5–5.1)
Sodium: 142 mmol/L (ref 135–145)

## 2021-01-09 LAB — GLUCOSE, CAPILLARY
Glucose-Capillary: 103 mg/dL — ABNORMAL HIGH (ref 70–99)
Glucose-Capillary: 104 mg/dL — ABNORMAL HIGH (ref 70–99)
Glucose-Capillary: 112 mg/dL — ABNORMAL HIGH (ref 70–99)
Glucose-Capillary: 146 mg/dL — ABNORMAL HIGH (ref 70–99)
Glucose-Capillary: 96 mg/dL (ref 70–99)
Glucose-Capillary: 98 mg/dL (ref 70–99)

## 2021-01-09 IMAGING — DX DG CHEST 1V PORT
1 series · 1 of 1 positions shown · non-contrast
Comparison: [DATE]

CLINICAL DATA: Placement of central venous catheter

EXAM:
PORTABLE CHEST 1 VIEW

[chest]
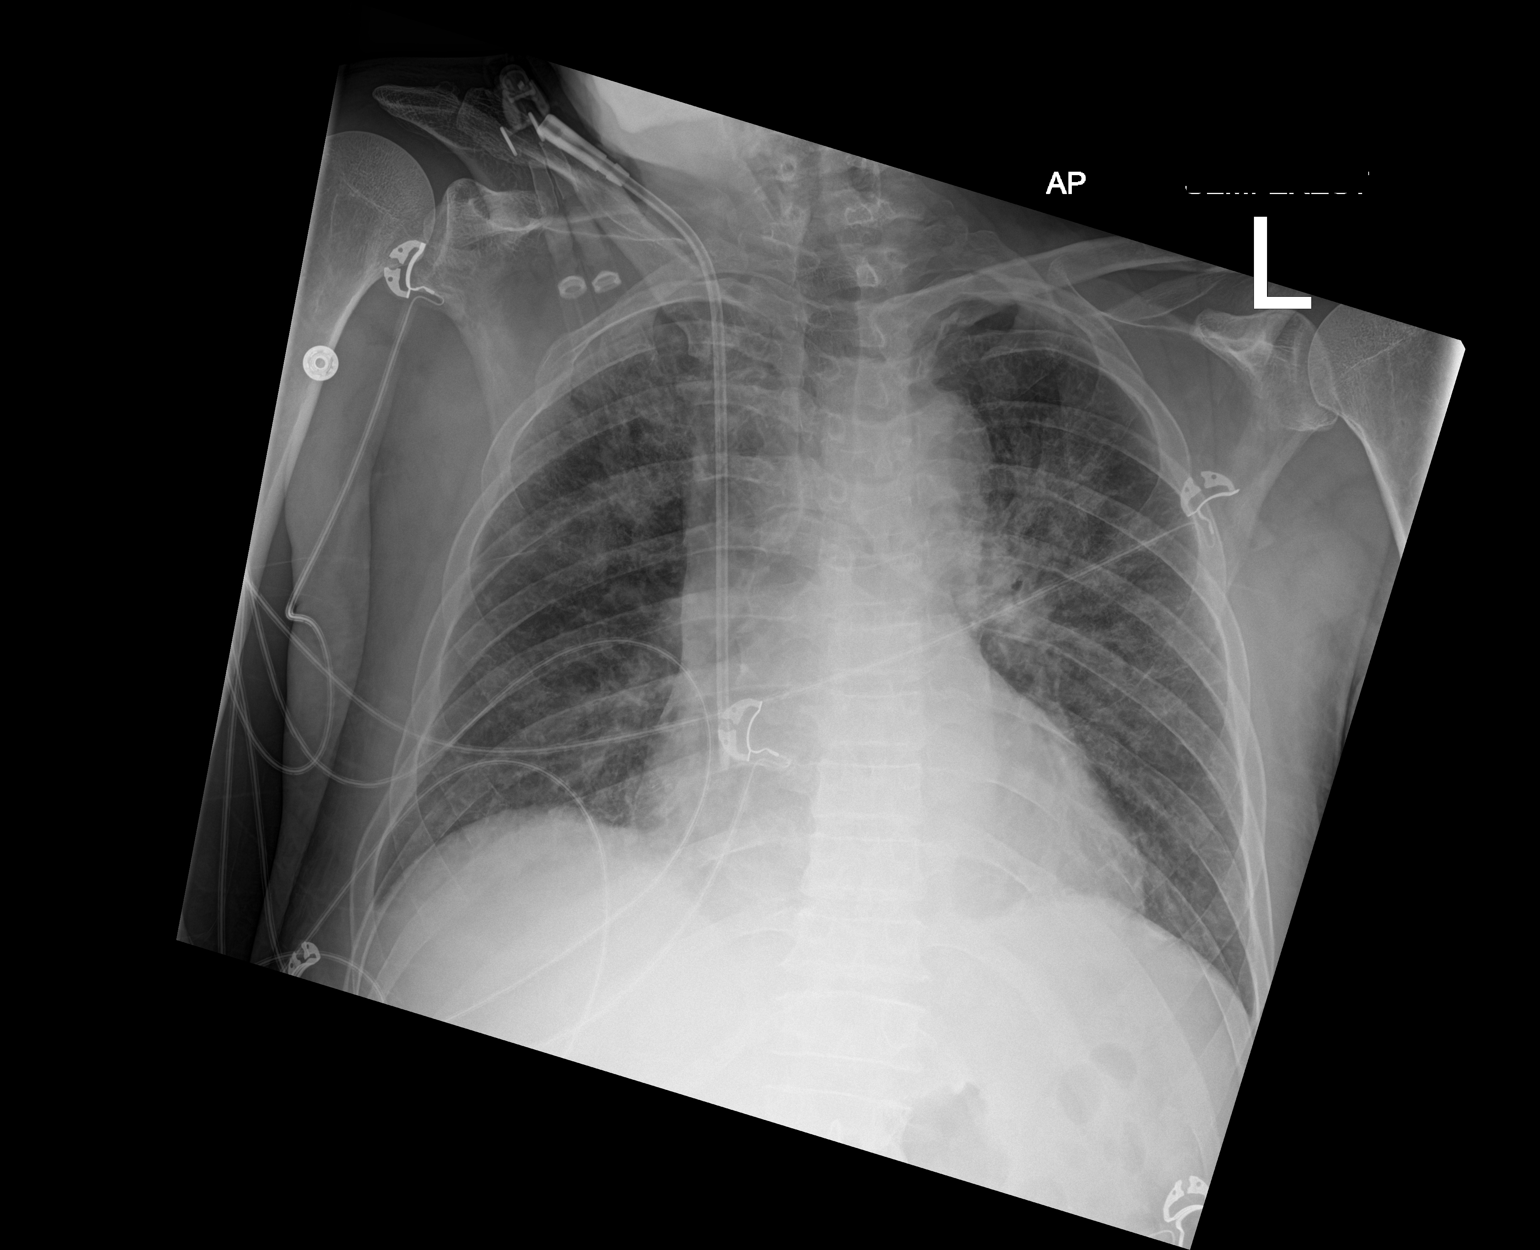

[1 of 1 positions shown; findings below may reference images not displayed]

FINDINGS: Transverse diameter of heart is increased. Central pulmonary vessels
are more prominent. There is interval increase in interstitial and
alveolar markings in the parahilar regions and lower lung fields.
Lateral CP angles are clear. There is no pneumothorax. There is
interval placement of central venous catheter through the right
jugular vein with its tip in the right atrium.
IMPRESSION: Tip of central venous catheter is seen in the right atrium. There is
no pneumothorax.

There is interval increase in interstitial and alveolar markings in
the parahilar regions and lower lung fields suggesting pulmonary
edema or bilateral pneumonia.

## 2021-01-09 MED ORDER — MIDAZOLAM HCL 2 MG/2ML IJ SOLN: 2.0000 mg | Freq: Once | INTRAMUSCULAR | Status: DC

## 2021-01-09 MED ORDER — QUETIAPINE FUMARATE 50 MG PO TABS
50.0000 mg | ORAL_TABLET | Freq: Two times a day (BID) | ORAL | Status: DC
  Administered 2021-01-09 – 2021-01-12 (×8): 50 mg via ORAL
  Filled 2021-01-09 (×8): qty 1

## 2021-01-09 MED ORDER — CHLORHEXIDINE GLUCONATE CLOTH 2 % EX PADS: 6.0000 | MEDICATED_PAD | Freq: Every day | CUTANEOUS | Status: DC

## 2021-01-09 MED ORDER — HEPARIN SODIUM (PORCINE) 1000 UNIT/ML IJ SOLN
3000.0000 [IU] | INTRAMUSCULAR | Status: DC | PRN
  Administered 2021-01-09: 12:00:00 2800 [IU] via INTRAVENOUS

## 2021-01-09 NOTE — Social Work (Signed)
Pt arrived from [facility] where he resides at this time. Pt has been in their residential treatment facility since October 1st. Director of the Facility is Steve [name] [phone]), he provided CSW with the emergency contact he has on file.  Steve [name] [phone]) is pt's son, Steve [name] attempted to update son, but did not receive a return call. CSW attempted to call the son as well, was unable to leave VM. Steve [name] notes he is unsure if this is even the right number, it is just the one they have. Steve [name] notes pt has a spot and can return when medically cleared. He states pt is on Hydroxozine 500 mg and Fluoxetine HCL 40 mg at home. He asked if CSW could confirm if pt had any substances in his system upon admission, CSW noted she was unable to share that information due to HIPAA. TOC will continue to follow for any further needs.

## 2021-01-09 NOTE — Plan of Care (Signed)
Emergent consent for dialysis.  Dr. Celine Mans and I have spoken this AM and both agree that dialysis is indicated with oligoanuric AKI and worsening azotemia and altered mental status in the setting of rhabdo.  He is altered and we do not have a next of kin name or number to contact.   Critical care will place a nontunneled catheter for dialysis - appreciate Dr. Humphrey Rolls assistance.    Start HD today   Steve Emms, MD  8:42 AM 01/09/2021

## 2021-01-09 NOTE — Procedures (Signed)
Central Venous Catheter Insertion Procedure Note  Steve [name]  [id number]  1965/09/03  Date:01/09/21  Time:11:42 AM   Provider Performing:Osmin Welz Humphrey Rolls   Procedure: Insertion of Non-tunneled Central Venous Catheter(36556)with US guidance (42683)    Indication(s) Hemodialysis  Consent Unable to obtain consent due to inability to find a medical decision maker for patient.  All reasonable efforts were made.  Another independent medical provider, Dr. Vallery Sa , confirmed the benefits of this procedure outweigh the risks.  Anesthesia Topical only with 1% lidocaine   Timeout Verified patient identification, verified procedure, site/side was marked, verified correct patient position, special equipment/implants available, medications/allergies/relevant history reviewed, required imaging and test results available.  Sterile Technique Maximal sterile technique including full sterile barrier drape, hand hygiene, sterile gown, sterile gloves, mask, hair covering, sterile ultrasound probe cover (if used).  Procedure Description Area of catheter insertion was cleaned with chlorhexidine and draped in sterile fashion.   With real-time ultrasound guidance a HD catheter was placed into the right internal jugular vein.  Nonpulsatile blood flow and easy flushing noted in all ports.  The catheter was sutured in place and sterile dressing applied.  Complications/Tolerance None; patient tolerated the procedure well. Chest X-ray is ordered to verify placement for internal jugular or subclavian cannulation.  Chest x-ray is not ordered for femoral cannulation.  EBL Minimal  Specimen(s) None    Durel Salts, MD Pulmonary and Critical Care Medicine [facility] 01/09/2021 11:43 AM Pager: see AMION  If no response to pager, please call critical care on call (see AMION) until 7pm After 7:00 pm call Elink

## 2021-01-09 NOTE — Progress Notes (Signed)
NAME:  Steve [name], MRN:  [id number], DOB:  21-Jan-1965, LOS: 4 ADMISSION DATE:  01/05/2021, CONSULTATION DATE:  12/20 REFERRING MD:  12/20, CHIEF COMPLAINT:  sepsis and AMS   History of Present Illness:  This is a 55 year old white male with limited medical history on presentation to the emergency room.  He was brought from EMS after being found at a group home.  Reportedly his roommate found him having full body convulsions, followed by altered sensorium and combativeness.  Per report from EMS roommate had been noticing the patient was not acting typical for the prior few days before. On arrival to ER was found to be slightly hypotensive with systolic blood pressure in the 90s, room air saturations low 90s, he was confused, and at times combative.  A CT head was obtained this was negative for acute bleeding but did show prominent fluid space at the left cerebral pontine angle raising possibility of epidermoid or arachnoid cyst. Initial lab work: Serum creatinine of 3.27, abnormal LFTs, white blood cell count 23.7, and lactic acid 7.6.  Blood cultures were sent, empiric antibiotic started, IV fluid resuscitation administered.  Because of multiple metabolic derangements, altered sensorium, and concern about seizure the critical care team was asked to admit.  Pertinent  Medical History  Formal history not available on time of admission.  Home medications notable for Prozac, Neurontin, hydroxyzine and trazodone Significant Hospital Events: Including procedures, antibiotic start and stop dates in addition to other pertinent events   12/20 admitted for acute encephalopathy concern for seizure activity with agitation requiring precedex gtt 12/21 LP shows Staph in CSF - felt to be contaminant 12/21 - spot EEG no seizures 12/22 continuous EEG no seizures  Interim History / Subjective:  Minimal uop.  Agitated overnight needing lots of prn versed  Objective   Blood pressure (!) 146/86, pulse (!) 48,  temperature 97.6 F (36.4 C), temperature source Axillary, resp. rate (!) 25, height $RemoveBe'5\' 8"'EKRHHXmme$  (1.727 m), weight 81.7 kg, SpO2 94 %.        Intake/Output Summary (Last 24 hours) at 01/09/2021 1006 Last data filed at 01/09/2021 0847 Gross per 24 hour  Intake 2636.23 ml  Output 655 ml  Net 1981.23 ml   Filed Weights   01/07/21 0415 01/08/21 0500 01/09/21 0500  Weight: 73.8 kg 71.7 kg 81.7 kg    Physical Exam  Gen:      Pleasant, delirious, in 4 point restraints Lungs:    ctab no wheezes or crackles CV:         Regular rate and rhythm; no murmurs Abd:      Soft, nontender Ext:    Diffuse mild edema Skin:      Warm and dry; no rash Neuro:    AO to self only.   Labs reviewed - worsening Cr and BUN Persistent acidosis  Resolved Hospital Problem list     Assessment & Plan:   Acute renal failure Rhabdomyolysis AG and Non-anion gap metabolic acidosis  Uremic Encephalopathy No known history of renal disease although no prior records can be found. CK>50,000 on presentation. sCr 3.27>3.79>4.86 with minimal UOP. UA with hemoglobinuria consistent with rhabdomyolysis. Pt has received ~9L LR with only 350cc UOP. Renal US without hydronephrosis; renal failure likely from ongoing rhabdo vs ATN.  - continue IVF per nephrology - plan is for HD catheter placement and dialysis today. This will be done emergently as there is no family or next of kin available for consent for line placement or dialysis.  Acute toxic metabolic encephalopathy DDX includes substance use, seizures, small acute infarct, uremic encephalopathy. Infection has been ruled out.  - vimpat per neurology - continue seroquel and precedex. Stop benzos.  -  Treatment of uremia as above  Sepsis 2/2 aspiration PNA Patient initially hypotensive on presentation with mild fever. CXR with concerns of aspiration PNA for which patient given vanc/cefepime and started on Unasyn. Remains afebrile and leukocytosis improving. Blood cx neg  to date - complete 5 days total Unasyn  - blood cultures reviewed NGTD - Trend CBC  Acute liver injury AST/ALT elevated with normal Alk phos and T.bili. This is likely 2/2 rhabdomyolysis.  - Trend LFTs, gradually improving  Normocytic anemia In setting of acute illness vs hemodilution from fluid resuscitation. No signs of bleeding at this time  - Trend CBC - Iron studies when past acute illness   Abnormal Echo Echo with tricuspid valve regurgitation and right ventricular volume overload with evidence of atrial level shunting concerning for ASD. Also concerns for constrictive pericarditis - this all may be related to artifact as well. Consider repeat TTE when stable, from encephalopathy  Best Practice (right click and "Reselect all SmartList Selections" daily)   Diet/type: NPO DVT prophylaxis: prophylactic heparin  GI prophylaxis: N/A Lines: N/A Foley:  N/A Code Status:  full code Last date of multidisciplinary goals of care discussion [no family listed, pt altered - SW consult to assist with collateral hx and finding family]  The patient is critically ill due to encephalopathy.  Critical care was necessary to treat or prevent imminent or life-threatening deterioration.  Critical care was time spent personally by me on the following activities: development of treatment plan with patient and/or surrogate as well as nursing, discussions with consultants, evaluation of patient's response to treatment, examination of patient, obtaining history from patient or surrogate, ordering and performing treatments and interventions, ordering and review of laboratory studies, ordering and review of radiographic studies, pulse oximetry, re-evaluation of patient's condition and participation in multidisciplinary rounds.   Critical Care Time devoted to patient care services described in this note is 35 minutes. This time reflects time of care of this [place] . This critical care time does not  reflect separately billable procedures or procedure time, teaching time or supervisory time of PA/NP/Med student/Med Resident etc but could involve care discussion time.       [facility] 01/09/2021 10:06 AM  Pager: see AMION  If no response to pager , please call critical care on call (see AMION) until 7pm After 7:00 pm call Elink     Labs   CBC: Recent Labs  Lab 01/05/21 1342 01/05/21 1538 01/06/21 0550 01/07/21 0247 01/08/21 0056  WBC 23.7* 20.3* 12.9* 10.9* 10.4  HGB 15.1 13.6 12.0* 11.8* 11.5*  HCT 46.8 41.1 35.8* 34.7* 32.6*  MCV 99.8 99.3 96.2 94.0 93.7  PLT 382 321 218 196 696    Basic Metabolic Panel: Recent Labs  Lab 01/06/21 0550 01/06/21 1402 01/07/21 0247 01/08/21 0056 01/09/21 0625  NA 140 142 138 138 142  K 5.3* 5.2* 4.9 4.8 4.6  CL 109 112* 110 111 110  CO2 20* 19* 15* 13* 17*  GLUCOSE 132* 110* 101* 88 128*  BUN 59* 67* 79* 85* 91*  CREATININE 3.79* 3.98* 4.86* 6.17* 7.26*  CALCIUM 6.5* 6.6* 6.6* 6.9* 7.2*   GFR: Estimated Creatinine Clearance: 11.1 mL/min (A) (by C-G formula based on SCr of 7.26 mg/dL (H)). Recent  Labs  Lab 01/05/21 1538 01/05/21 1751 01/05/21 2137 01/06/21 0550 01/07/21 0247 01/08/21 0056 01/08/21 1425  PROCALCITON  --   --   --  19.81  --   --   --   WBC 20.3*  --   --  12.9* 10.9* 10.4  --   LATICACIDVEN 2.8* 4.0* 2.4*  --   --   --  1.9    Liver Function Tests: Recent Labs  Lab 01/05/21 1342 01/06/21 0550 01/06/21 1402 01/07/21 0247 01/08/21 0056  AST 1,267* 1,688* 1,608* 1,379* 739*  ALT 893* 877* 952* 1,051* 712*  ALKPHOS 90 55 55 64 63  BILITOT 1.0 0.9 0.9 0.6 1.4*  PROT 8.0 5.9* 5.5* 5.6* 5.3*  ALBUMIN 4.1 3.2* 2.9* 2.8* 2.4*   No results for input(s): LIPASE, AMYLASE in the last 168 hours. Recent Labs  Lab 01/07/21 1214  AMMONIA 27    ABG No results found for: PHART, PCO2ART, PO2ART, HCO3, TCO2, ACIDBASEDEF, O2SAT   Coagulation  Profile: Recent Labs  Lab 01/05/21 1342  INR 1.2    Cardiac Enzymes: Recent Labs  Lab 01/05/21 1751 01/07/21 0247 01/08/21 0056  CKTOTAL >50,000* 38,455* 23,371*    HbA1C: No results found for: HGBA1C  CBG: Recent Labs  Lab 01/08/21 1109 01/08/21 1512 01/08/21 2326 01/09/21 0312 01/09/21 0712  GLUCAP 75 74 114* 103* 146*

## 2021-01-09 NOTE — Progress Notes (Signed)
[facility] Progress Note  Name: Steve [name] MRN: [id number] DOB: 22-May-1965  Chief Complaint:  Altered mental status  Subjective:  no labs are available - confirmed order placed.  Appreciate nursing.  He had 250 mL uop over 12/23 charted.  He states that he lives in [place].  Has been off of bicarb awaiting a new bag.  So agitated that he has been on precedex.  Review of systems:  Unable to obtain 2/2 ams ---------------------- Background on consult:  Steve [name] is a 55 y.o. male  with limited known past medical history who presented to the ER after EMS was called to his group home.  EMS reported that his roommate found him having full body convulsions and then he had altered mental status and was combative.  Also per chart review EMS indicates that he roommate had reported atypical behavior a few days prior as well.  Though per charting he was found to have, it is not sure how long he was down.  He was found to have AKI which is progressively worsening and has also had a fever and was found to have rhabdomyolysis.  He had an LP which had gram stain with GPC in clusters per ID consult - they are concerned may have been a contaminant.  He was initiated on antibiotics but infectious disease has stopped empiric antibiotics as of 12/22 note.  Earlier in his course he received vanc, cefepime, unasyn, and ceftriaxone since his admission.  They were concerned that he may have a viral process with rhabdo and seizure.  He had 750 ml UOP over 12/22. Renal US on 12/21 with no hydro and echogenicity within normal limits.  He is covid negative.  He has been on LR at 250 ml/hr.  Yesterday foley was inserted and he had immediate return of urine with 500 ml over a 12 hour period per nursing.     History is limited - he states he cannot remember when asked many questions and he states year is 1515.  We do not have the name of the group home he is in.  We do not have information about next of  kin.      Intake/Output Summary (Last 24 hours) at 01/09/2021 6301 Last data filed at 01/09/2021 0600 Gross per 24 hour  Intake 3320.15 ml  Output 250 ml  Net 3070.15 ml    Vitals:  Vitals:   01/08/21 1700 01/08/21 1730 01/08/21 1824 01/08/21 1830  BP: (!) 154/82 (!) 150/108 129/60 (!) 143/61  Pulse: (!) 104     Resp: (!) 23 (!) 23 (!) 0 12  Temp:      TempSrc:      SpO2: (!) 73%     Weight:      Height:         Physical Exam:  General adult male in bed on room air, altered HEENT normocephalic atraumatic extraocular movements intact sclera anicteric Neck supple trachea midline Lungs crackles on auscultation; unlabored on room air  Heart S1S2 no rub; tachy Abdomen soft nontender nondistended Extremities 1+ edema  Psych he is calm on exam but is on precedex Neuro - awakes with exam but sleepy; oriented to person and states he is in a hospital; year is 42.  Tells me he lives in [place] but little else  Medications reviewed   Labs:  BMP Latest Ref Rng & Units 01/08/2021 01/07/2021 01/06/2021  Glucose 70 - 99 mg/dL 88 601(U) 932(T)  BUN 6 - 20 mg/dL 55(D)  79(H) 67(H)  Creatinine 0.61 - 1.24 mg/dL 6.81(E) 7.51(Z) 0.01(V)  Sodium 135 - 145 mmol/L 138 138 142  Potassium 3.5 - 5.1 mmol/L 4.8 4.9 5.2(H)  Chloride 98 - 111 mmol/L 111 110 112(H)  CO2 22 - 32 mmol/L 13(L) 15(L) 19(L)  Calcium 8.9 - 10.3 mg/dL 6.9(L) 6.6(L) 6.6(L)     Assessment/Plan:   # AKI  - Secondary to rhabdomyolysis and pigment nephropathy.  Large Hb and 0-5 RBC. 100 mg/dL protein.  CK trending down. Up/cr ratio 1010 mg/g - on bicarb gtt - reduce to 75 ml/hr for now - await today's labs  - continue foley  - anticipate need for dialysis soon.  He is altered with no family contact known (they do not actually even have name of his group home) so if dialysis is needed would be emergent consent  - defer aspirin to primary team    # Rhabdomyolysis  - transitioned to bicarb gtt   # Metabolic  acidosis  - setting of AKI; lactic acidosis resolved - has been on bicarb and await updated labs   # Seizure  - per neurology and critical care     # Encephalopathy  - setting of seizure and aki and initially covered with abx as concern for infection - appreciate primary team and neuro  - optimize renal function as above   # Transaminitis  - had been improving; hopefully renal function will follow   # Hypocalcemia - improves with correction for albumin  - s/p calcium gluconate  Disposition - in ICU  Steve Emms, MD 01/09/2021 6:25 AM

## 2021-01-10 LAB — RENAL FUNCTION PANEL
Albumin: 2.2 g/dL — ABNORMAL LOW (ref 3.5–5.0)
Anion gap: 11 (ref 5–15)
BUN: 89 mg/dL — ABNORMAL HIGH (ref 6–20)
CO2: 24 mmol/L (ref 22–32)
Calcium: 7.2 mg/dL — ABNORMAL LOW (ref 8.9–10.3)
Chloride: 107 mmol/L (ref 98–111)
Creatinine, Ser: 7.61 mg/dL — ABNORMAL HIGH (ref 0.61–1.24)
GFR, Estimated: 8 mL/min — ABNORMAL LOW (ref >60)
Glucose, Bld: 131 mg/dL — ABNORMAL HIGH (ref 70–99)
Phosphorus: 5.1 mg/dL — ABNORMAL HIGH (ref 2.5–4.6)
Potassium: 4.1 mmol/L (ref 3.5–5.1)
Sodium: 142 mmol/L (ref 135–145)

## 2021-01-10 LAB — CULTURE, BLOOD (ROUTINE X 2)
Culture: NO GROWTH
Culture: NO GROWTH
Special Requests: ADEQUATE
Special Requests: ADEQUATE

## 2021-01-10 LAB — CSF CULTURE W GRAM STAIN: Culture: NO GROWTH

## 2021-01-10 LAB — GLUCOSE, CAPILLARY
Glucose-Capillary: 124 mg/dL — ABNORMAL HIGH (ref 70–99)
Glucose-Capillary: 117 mg/dL — ABNORMAL HIGH (ref 70–99)
Glucose-Capillary: 122 mg/dL — ABNORMAL HIGH (ref 70–99)
Glucose-Capillary: 130 mg/dL — ABNORMAL HIGH (ref 70–99)
Glucose-Capillary: 112 mg/dL — ABNORMAL HIGH (ref 70–99)
Glucose-Capillary: 104 mg/dL — ABNORMAL HIGH (ref 70–99)

## 2021-01-10 MED ORDER — SODIUM CHLORIDE 0.9% FLUSH
10.0000 mL | Freq: Two times a day (BID) | INTRAVENOUS | Status: DC
  Administered 2021-01-10 – 2021-01-14 (×10): 10 mL

## 2021-01-10 MED ORDER — SODIUM CHLORIDE 0.9% FLUSH: 10.0000 mL | INTRAVENOUS | Status: DC | PRN

## 2021-01-10 NOTE — Progress Notes (Signed)
NAME:  Steve [name], MRN:  [id number], DOB:  Jun 07, 1965, LOS: 5 ADMISSION DATE:  01/05/2021, CONSULTATION DATE:  12/20 REFERRING MD:  12/20, CHIEF COMPLAINT:  sepsis and AMS   History of Present Illness:  This is a 55 year old white male with limited medical history on presentation to the emergency room.  He was brought from EMS after being found at a group home.  Reportedly his roommate found him having full body convulsions, followed by altered sensorium and combativeness.  Per report from EMS roommate had been noticing the patient was not acting typical for the prior few days before. On arrival to ER was found to be slightly hypotensive with systolic blood pressure in the 90s, room air saturations low 90s, he was confused, and at times combative.  A CT head was obtained this was negative for acute bleeding but did show prominent fluid space at the left cerebral pontine angle raising possibility of epidermoid or arachnoid cyst. Initial lab work: Serum creatinine of 3.27, abnormal LFTs, white blood cell count 23.7, and lactic acid 7.6.  Blood cultures were sent, empiric antibiotic started, IV fluid resuscitation administered.  Because of multiple metabolic derangements, altered sensorium, and concern about seizure the critical care team was asked to admit.  Pertinent  Medical History  Formal history not available on time of admission.  Home medications notable for Prozac, Neurontin, hydroxyzine and trazodone Significant Hospital Events: Including procedures, antibiotic start and stop dates in addition to other pertinent events   12/20 admitted for acute encephalopathy concern for seizure activity with agitation requiring precedex gtt 12/21 LP shows Staph in CSF - felt to be contaminant 12/21 - spot EEG no seizures 12/22 continuous EEG no seizures 12/24 trialysis catheter placed for HD  Interim History / Subjective:  Uop picked up overnight. Still delirious requiring precedex and prn  versed  Objective   Blood pressure (!) 151/88, pulse 60, temperature 97.7 F (36.5 C), temperature source Axillary, resp. rate (!) 21, height _0  (1.727 m), weight 81.7 kg, SpO2 100 %.        Intake/Output Summary (Last 24 hours) at 01/10/2021 1018 Last data filed at 01/10/2021 0600 Gross per 24 hour  Intake 2143.96 ml  Output 975 ml  Net 1168.96 ml   Filed Weights   01/07/21 0415 01/08/21 0500 01/09/21 0500  Weight: 73.8 kg 71.7 kg 81.7 kg    Physical Exam  Gen:    Delirious, in restraints Lungs:   ctab no wheeze, no increased wob CV:        RRR no mrg Abd:      Soft, nontender Ext:    Upper extremity edema Skin:      Warm and dry; no rash Neuro:    alert and oriented to self only.    Labs reviewed: Cr 7.61, BUN 131 Phos 5.1 Alb 2.2   Resolved Hospital Problem list     Assessment & Plan:   Acute renal failure Rhabdomyolysis AG and Non-anion gap metabolic acidosis  Uremic Encephalopathy - Has been adequately fluid resuscitated - needs RRT asap for uremic encephalopathy - bicarb per nephrology - emergent dialysis today.   Acute toxic metabolic encephalopathy DDX includes substance use, seizures, small acute infarct, uremic encephalopathy. Infection has been ruled out.  - vimpat per neurology - continue seroquel and precedex. Stop benzos.  -  Treatment of uremia as above  Sepsis 2/2 aspiration PNA - fday 4/5 days total Unasyn  - Trend CBC  Acute liver injury AST/ALT elevated with  normal Alk phos and T.bili. This is likely 2/2 rhabdomyolysis.  - Trend LFTs, gradually improving  Normocytic anemia In setting of acute illness vs hemodilution from fluid resuscitation. No signs of bleeding at this time  - Trend CBC - Iron studies when past acute illness   Abnormal Echo Echo with tricuspid valve regurgitation and right ventricular volume overload with evidence of atrial level shunting concerning for ASD. Also concerns for constrictive pericarditis - this  all may be related to artifact as well. Consider repeat TTE when stable, from encephalopathy  Best Practice (right click and "Reselect all SmartList Selections" daily)   Diet/type: NPO DVT prophylaxis: prophylactic heparin  GI prophylaxis: N/A Lines: N/A Foley:  N/A Code Status:  full code Last date of multidisciplinary goals of care discussion [attempts to call son from number provided by group home, no answer]  Labs   CBC: Recent Labs  Lab 01/05/21 1342 01/05/21 1538 01/06/21 0550 01/07/21 0247 01/08/21 0056  WBC 23.7* 20.3* 12.9* 10.9* 10.4  HGB 15.1 13.6 12.0* 11.8* 11.5*  HCT 46.8 41.1 35.8* 34.7* 32.6*  MCV 99.8 99.3 96.2 94.0 93.7  PLT 382 321 218 196 161    Basic Metabolic Panel: Recent Labs  Lab 01/06/21 1402 01/07/21 0247 01/08/21 0056 01/09/21 0625 01/10/21 0741  NA 142 138 138 142 142  K 5.2* 4.9 4.8 4.6 4.1  CL 112* 110 111 110 107  CO2 19* 15* 13* 17* 24  GLUCOSE 110* 101* 88 128* 131*  BUN 67* 79* 85* 91* 89*  CREATININE 3.98* 4.86* 6.17* 7.26* 7.61*  CALCIUM 6.6* 6.6* 6.9* 7.2* 7.2*  PHOS  --   --   --   --  5.1*   GFR: Estimated Creatinine Clearance: 10.6 mL/min (A) (by C-G formula based on SCr of 7.61 mg/dL (H)). Recent Labs  Lab 01/05/21 1538 01/05/21 1751 01/05/21 2137 01/06/21 0550 01/07/21 0247 01/08/21 0056 01/08/21 1425  PROCALCITON  --   --   --  19.81  --   --   --   WBC 20.3*  --   --  12.9* 10.9* 10.4  --   LATICACIDVEN 2.8* 4.0* 2.4*  --   --   --  1.9    Liver Function Tests: Recent Labs  Lab 01/05/21 1342 01/06/21 0550 01/06/21 1402 01/07/21 0247 01/08/21 0056 01/10/21 0741  AST 1,267* 1,688* 1,608* 1,379* 739*  --   ALT 893* 877* 952* 1,051* 712*  --   ALKPHOS 90 55 55 64 63  --   BILITOT 1.0 0.9 0.9 0.6 1.4*  --   PROT 8.0 5.9* 5.5* 5.6* 5.3*  --   ALBUMIN 4.1 3.2* 2.9* 2.8* 2.4* 2.2*   No results for input(s): LIPASE, AMYLASE in the last 168 hours. Recent Labs  Lab 01/07/21 1214  AMMONIA 27     ABG No results found for: PHART, PCO2ART, PO2ART, HCO3, TCO2, ACIDBASEDEF, O2SAT   Coagulation Profile: Recent Labs  Lab 01/05/21 1342  INR 1.2    Cardiac Enzymes: Recent Labs  Lab 01/05/21 1751 01/07/21 0247 01/08/21 0056  CKTOTAL >50,000* 38,455* 23,371*    HbA1C: No results found for: HGBA1C  CBG: Recent Labs  Lab 01/09/21 1518 01/09/21 1926 01/09/21 2343 01/10/21 0321 01/10/21 0810  GLUCAP 98 96 104* 124* 117*   Critical Care Time:  The patient is critically ill due to encephalopathy, renal failure.  Critical care was necessary to treat or prevent imminent or life-threatening deterioration.  Critical care was time spent personally by me  on the following activities: development of treatment plan with patient and/or surrogate as well as nursing, discussions with consultants, evaluation of patient's response to treatment, examination of patient, obtaining history from patient or surrogate, ordering and performing treatments and interventions, ordering and review of laboratory studies, ordering and review of radiographic studies, pulse oximetry, re-evaluation of patient's condition and participation in multidisciplinary rounds.   Critical Care Time devoted to patient care services described in this note is 32 minutes. This time reflects time of care of this [place] . This critical care time does not reflect separately billable procedures or procedure time, teaching time or supervisory time of PA/NP/Med student/Med Resident etc but could involve care discussion time.       [facility] 01/10/2021 10:18 AM  Pager: see AMION  If no response to pager , please call critical care on call (see AMION) until 7pm After 7:00 pm call Elink

## 2021-01-10 NOTE — Progress Notes (Signed)
[facility] Progress Note  Name: Steve [name] MRN: [id number] DOB: 1965-07-12  Chief Complaint:  Altered mental status  Subjective:  Nontunneled catheter placed with critical care on 12/24 and awaiting HD.  He has been on bicarb gtt.  He had 0.9 L UOP over 12/24.  He is on precedex for agitation.  I tried to call his son per the number in the social work note from 12/24 and did not reach him.    Review of systems:  Unable to reliably obtain 2/2 ams  Denies shortness of breath or chest pain Denies n/v ---------------------- Background on consult:  Steve [name] is a 55 y.o. male  with limited known past medical history who presented to the ER after EMS was called to his group home.  EMS reported that his roommate found him having full body convulsions and then he had altered mental status and was combative.  Also per chart review EMS indicates that he roommate had reported atypical behavior a few days prior as well.  Though per charting he was found to have, it is not sure how long he was down.  He was found to have AKI which is progressively worsening and has also had a fever and was found to have rhabdomyolysis.  He had an LP which had gram stain with GPC in clusters per ID consult - they are concerned may have been a contaminant.  He was initiated on antibiotics but infectious disease has stopped empiric antibiotics as of 12/22 note.  Earlier in his course he received vanc, cefepime, unasyn, and ceftriaxone since his admission.  They were concerned that he may have a viral process with rhabdo and seizure.  He had 750 ml UOP over 12/22. Renal US on 12/21 with no hydro and echogenicity within normal limits.  He is covid negative.  He has been on LR at 250 ml/hr.  Yesterday foley was inserted and he had immediate return of urine with 500 ml over a 12 hour period per nursing.     History is limited - he states he cannot remember when asked many questions and he states year is 1515.   We do not have the name of the group home he is in.  We do not have information about next of kin.      Intake/Output Summary (Last 24 hours) at 01/10/2021 9758 Last data filed at 01/10/2021 0300 Gross per 24 hour  Intake 2059.89 ml  Output 895 ml  Net 1164.89 ml    Vitals:  Vitals:   01/09/21 2330 01/10/21 0000 01/10/21 0030 01/10/21 0100  BP: (!) 188/73 (!) 147/94 (!) 145/89 (!) 145/85  Pulse: (!) 57 (!) 54 (!) 57 (!) 59  Resp: 13 20 19  (!) 21  Temp:      TempSrc:      SpO2: 99% 99% 100% 100%  Weight:      Height:         Physical Exam:  General adult male in bed on room air, altered HEENT normocephalic atraumatic extraocular movements intact sclera anicteric Neck supple trachea midline Lungs crackles on auscultation; unlabored on room air  Heart S1S2 no rub Abdomen soft nontender nondistended Extremities no pitting edema  Psych he is calm on exam but is on precedex Neuro - awakes with my exam but sleepy; oriented to person and states he is in a hospital; year is 1978 GU foley catheter in place Access: RIJ nontunneled catheter  Medications reviewed   Labs:  BMP Latest Ref  Rng & Units 01/09/2021 01/08/2021 01/07/2021  Glucose 70 - 99 mg/dL 960(A) 88 540(J)  BUN 6 - 20 mg/dL 81(X) 91(Y) 78(G)  Creatinine 0.61 - 1.24 mg/dL 9.56(O) 1.30(Q) 6.57(Q)  Sodium 135 - 145 mmol/L 142 138 138  Potassium 3.5 - 5.1 mmol/L 4.6 4.8 4.9  Chloride 98 - 111 mmol/L 110 111 110  CO2 22 - 32 mmol/L 17(L) 13(L) 15(L)  Calcium 8.9 - 10.3 mg/dL 7.2(L) 6.9(L) 6.6(L)     Assessment/Plan:   # AKI  - Secondary to rhabdomyolysis and pigment nephropathy.  Large Hb and 0-5 RBC. 100 mg/dL protein.  CK trending down. Up/cr ratio 1010 mg/g.  Nontunneled catheter placed on 12/24 by critical care - on bicarb gtt - continue for now - await today's labs - have placed order for daily renal panel - HD today and assess dialysis needs daily  - continue foley  - defer aspirin to primary team    #  Rhabdomyolysis  - s/p fluid resuscitation; transitioned to bicarb gtt    # Metabolic acidosis  - setting of AKI; lactic acidosis resolved - has been on bicarb and await updated labs   # Seizure  - per neurology and critical care     # Encephalopathy  - setting of seizure and aki and initially covered with abx as concern for infection - appreciate primary team and neuro  - optimize renal function and acidosis as above   # Transaminitis  - improving last check; hopefully renal function will follow suit    # Hypocalcemia - improves with correction for albumin  - s/p calcium gluconate  Disposition - in ICU and initiating dialysis  Estanislado Emms, MD 01/10/2021 6:36 AM

## 2021-01-11 LAB — RENAL FUNCTION PANEL
Albumin: 2.4 g/dL — ABNORMAL LOW (ref 3.5–5.0)
Anion gap: 16 — ABNORMAL HIGH (ref 5–15)
BUN: 84 mg/dL — ABNORMAL HIGH (ref 6–20)
CO2: 24 mmol/L (ref 22–32)
Calcium: 7.2 mg/dL — ABNORMAL LOW (ref 8.9–10.3)
Chloride: 104 mmol/L (ref 98–111)
Creatinine, Ser: 7.48 mg/dL — ABNORMAL HIGH (ref 0.61–1.24)
GFR, Estimated: 8 mL/min — ABNORMAL LOW (ref >60)
Glucose, Bld: 109 mg/dL — ABNORMAL HIGH (ref 70–99)
Phosphorus: 4.4 mg/dL (ref 2.5–4.6)
Potassium: 4.5 mmol/L (ref 3.5–5.1)
Sodium: 144 mmol/L (ref 135–145)

## 2021-01-11 LAB — GLUCOSE, CAPILLARY
Glucose-Capillary: 111 mg/dL — ABNORMAL HIGH (ref 70–99)
Glucose-Capillary: 113 mg/dL — ABNORMAL HIGH (ref 70–99)
Glucose-Capillary: 122 mg/dL — ABNORMAL HIGH (ref 70–99)
Glucose-Capillary: 102 mg/dL — ABNORMAL HIGH (ref 70–99)
Glucose-Capillary: 108 mg/dL — ABNORMAL HIGH (ref 70–99)
Glucose-Capillary: 108 mg/dL — ABNORMAL HIGH (ref 70–99)

## 2021-01-11 MED ORDER — OXYCODONE HCL 5 MG PO TABS
5.0000 mg | ORAL_TABLET | Freq: Four times a day (QID) | ORAL | Status: DC | PRN
  Administered 2021-01-11 – 2021-01-15 (×11): 5 mg via ORAL
  Filled 2021-01-11 (×11): qty 1

## 2021-01-11 MED ORDER — FUROSEMIDE 10 MG/ML IJ SOLN
40.0000 mg | Freq: Two times a day (BID) | INTRAMUSCULAR | Status: DC
  Administered 2021-01-11 – 2021-01-13 (×6): 40 mg via INTRAVENOUS
  Filled 2021-01-11 (×6): qty 4

## 2021-01-11 MED ORDER — ONDANSETRON HCL 4 MG/2ML IJ SOLN
4.0000 mg | Freq: Three times a day (TID) | INTRAMUSCULAR | Status: DC | PRN
  Administered 2021-01-11 – 2021-04-23 (×3): 4 mg via INTRAVENOUS
  Filled 2021-01-11 (×3): qty 2

## 2021-01-11 MED ORDER — FENTANYL CITRATE (PF) 100 MCG/2ML IJ SOLN
50.0000 ug | Freq: Once | INTRAMUSCULAR | Status: AC
  Administered 2021-01-11: 20:00:00 50 ug via INTRAVENOUS
  Filled 2021-01-11: qty 2

## 2021-01-11 NOTE — Plan of Care (Signed)
Neurology plan of care note:   Stroke up work up is complete. EEG negative. Changed AED due to AKI.   Please contact us prn for questions or for seizure activity.   Jimmye Norman, NP Neuro  Discussed plan with and approved by Dr. Thomasena Edis.

## 2021-01-11 NOTE — Progress Notes (Signed)
NAME:  Steve [name], MRN:  [id number], DOB:  1965/04/14, LOS: 6 ADMISSION DATE:  01/05/2021, CONSULTATION DATE:  12/20 REFERRING MD:  12/20, CHIEF COMPLAINT:  sepsis and AMS   History of Present Illness:  This is a 55 year old white male with limited medical history on presentation to the emergency room.  He was brought from EMS after being found at a group home.  Reportedly his roommate found him having full body convulsions, followed by altered sensorium and combativeness.  Per report from EMS roommate had been noticing the patient was not acting typical for the prior few days before. On arrival to ER was found to be slightly hypotensive with systolic blood pressure in the 90s, room air saturations low 90s, he was confused, and at times combative.  A CT head was obtained this was negative for acute bleeding but did show prominent fluid space at the left cerebral pontine angle raising possibility of epidermoid or arachnoid cyst. Initial lab work: Serum creatinine of 3.27, abnormal LFTs, white blood cell count 23.7, and lactic acid 7.6.  Blood cultures were sent, empiric antibiotic started, IV fluid resuscitation administered.  Because of multiple metabolic derangements, altered sensorium, and concern about seizure the critical care team was asked to admit.  Pertinent  Medical History  Formal history not available on time of admission.  Home medications notable for Prozac, Neurontin, hydroxyzine and trazodone Significant Hospital Events: Including procedures, antibiotic start and stop dates in addition to other pertinent events   12/20 admitted for acute encephalopathy concern for seizure activity with agitation requiring precedex gtt 12/21 LP shows Staph in CSF - felt to be contaminant 12/21 - spot EEG no seizures 12/22 continuous EEG no seizures 12/24 trialysis catheter placed for HD  Interim History / Subjective:  Responded to lasix. No dialysis yet. Uop picking up  Objective   Blood  pressure 115/78, pulse (!) 58, temperature 98.3 F (36.8 C), temperature source Oral, resp. rate (!) 22, height $RemoveBe'5\' 8"'BKeidKuVK$  (1.727 m), weight 83.6 kg, SpO2 94 %.        Intake/Output Summary (Last 24 hours) at 01/11/2021 1027 Last data filed at 01/11/2021 0700 Gross per 24 hour  Intake 1766.86 ml  Output 940 ml  Net 826.86 ml   Filed Weights   01/08/21 0500 01/09/21 0500 01/11/21 0325  Weight: 71.7 kg 81.7 kg 83.6 kg    Physical Exam  He is more alert and calm this morning Oriented to person and time, but not place or situation.  RRR Breathing non labored  Cr 7.58 BUN 84 K 4.5   Resolved Hospital Problem list     Assessment & Plan:   Acute renal failure Rhabdomyolysis AG and Non-anion gap metabolic acidosis  Uremic Encephalopathy - Has been adequately fluid resuscitated - continue Bicarb gtt - continue lasix per nephrology. I spoke with them. Given UOP picking up and numbers are stabilizing, will hold off on HD for now but leave catheter in. There are not enough nurses for HD.   Acute toxic metabolic encephalopathy DDX includes substance use, seizures, small acute infarct, uremic encephalopathy. Infection has been ruled out.  - vimpat per neurology - continue seroquel. Will try to titrate down precedex today - see if he can have renal recovery for the uremia  Sepsis 2/2 aspiration PNA - s/p 5 days unasyn - Trend CBC  Acute liver injury AST/ALT elevated with normal Alk phos and T.bili. This is likely 2/2 rhabdomyolysis.  - Trend LFTs, gradually improving  Normocytic anemia In  setting of acute illness vs hemodilution from fluid resuscitation. No signs of bleeding at this time  - Trend CBC - Iron studies when past acute illness   Abnormal Echo Echo with tricuspid valve regurgitation and right ventricular volume overload with evidence of atrial level shunting concerning for ASD. Also concerns for constrictive pericarditis - this all may be related to artifact as  well. Consider repeat TTE when stable, from encephalopathy  Best Practice (right click and "Reselect all SmartList Selections" daily)   Diet/type: NPO DVT prophylaxis: prophylactic heparin  GI prophylaxis: N/A Lines: N/A Foley:  N/A Code Status:  full code Last date of multidisciplinary goals of care discussion [not had due to no family available. Patient was residential drug rehabilitation prior to presentation]  Labs   CBC: Recent Labs  Lab 01/05/21 1342 01/05/21 1538 01/06/21 0550 01/07/21 0247 01/08/21 0056  WBC 23.7* 20.3* 12.9* 10.9* 10.4  HGB 15.1 13.6 12.0* 11.8* 11.5*  HCT 46.8 41.1 35.8* 34.7* 32.6*  MCV 99.8 99.3 96.2 94.0 93.7  PLT 382 321 218 196 756    Basic Metabolic Panel: Recent Labs  Lab 01/07/21 0247 01/08/21 0056 01/09/21 0625 01/10/21 0741 01/11/21 0217  NA 138 138 142 142 144  K 4.9 4.8 4.6 4.1 4.5  CL 110 111 110 107 104  CO2 15* 13* 17* 24 24  GLUCOSE 101* 88 128* 131* 109*  BUN 79* 85* 91* 89* 84*  CREATININE 4.86* 6.17* 7.26* 7.61* 7.48*  CALCIUM 6.6* 6.9* 7.2* 7.2* 7.2*  PHOS  --   --   --  5.1* 4.4   GFR: Estimated Creatinine Clearance: 11.8 mL/min (A) (by C-G formula based on SCr of 7.48 mg/dL (H)). Recent Labs  Lab 01/05/21 1538 01/05/21 1751 01/05/21 2137 01/06/21 0550 01/07/21 0247 01/08/21 0056 01/08/21 1425  PROCALCITON  --   --   --  19.81  --   --   --   WBC 20.3*  --   --  12.9* 10.9* 10.4  --   LATICACIDVEN 2.8* 4.0* 2.4*  --   --   --  1.9    Liver Function Tests: Recent Labs  Lab 01/05/21 1342 01/06/21 0550 01/06/21 1402 01/07/21 0247 01/08/21 0056 01/10/21 0741 01/11/21 0217  AST 1,267* 1,688* 1,608* 1,379* 739*  --   --   ALT 893* 877* 952* 1,051* 712*  --   --   ALKPHOS 90 55 55 64 63  --   --   BILITOT 1.0 0.9 0.9 0.6 1.4*  --   --   PROT 8.0 5.9* 5.5* 5.6* 5.3*  --   --   ALBUMIN 4.1 3.2* 2.9* 2.8* 2.4* 2.2* 2.4*   No results for input(s): LIPASE, AMYLASE in the last 168 hours. Recent Labs   Lab 01/07/21 1214  AMMONIA 27    ABG No results found for: PHART, PCO2ART, PO2ART, HCO3, TCO2, ACIDBASEDEF, O2SAT   Coagulation Profile: Recent Labs  Lab 01/05/21 1342  INR 1.2    Cardiac Enzymes: Recent Labs  Lab 01/05/21 1751 01/07/21 0247 01/08/21 0056  CKTOTAL >50,000* 38,455* 23,371*    HbA1C: No results found for: HGBA1C  CBG: Recent Labs  Lab 01/10/21 1611 01/10/21 1907 01/10/21 2306 01/11/21 0304 01/11/21 0708  GLUCAP 130* 112* 104* 111* 113*   The patient is critically ill due to acute encephalopathy.  Critical care was necessary to treat or prevent imminent or life-threatening deterioration.  Critical care was time spent personally by me on the following activities: development of  treatment plan with patient and/or surrogate as well as nursing, discussions with consultants, evaluation of patient's response to treatment, examination of patient, obtaining history from patient or surrogate, ordering and performing treatments and interventions, ordering and review of laboratory studies, ordering and review of radiographic studies, pulse oximetry, re-evaluation of patient's condition and participation in multidisciplinary rounds.   Critical Care Time devoted to patient care services described in this note is 34 minutes. This time reflects time of care of this [place] . This critical care time does not reflect separately billable procedures or procedure time, teaching time or supervisory time of PA/NP/Med student/Med Resident etc but could involve care discussion time.       Spero Geralds [facility] 01/11/2021 10:28 AM  Pager: see AMION  If no response to pager , please call critical care on call (see AMION) until 7pm After 7:00 pm call Elink

## 2021-01-11 NOTE — Progress Notes (Addendum)
eLink Physician-Brief Progress Note Patient Name: Steve [name] DOB: November 09, 1965 MRN: [id number]   Date of Service  01/11/2021  HPI/Events of Note  Patient complaining of pain all over, especially back pain (not new). Was getting 100 microgram fentanyl pushes but this was stopped and he was switched to oxycodone 5 mg every 6 hours. Given at 4 pm, not due for another couple hours. Has been on precedex, seroquel also due only at 10 pm. On room air, otherwise fairly calm but in pain.   eICU Interventions  50 microgram fentanyl x 1 ordered     Intervention Category Major Interventions: Delirium, psychosis, severe agitation - evaluation and management  Aleanna Menge G Felishia Wartman 01/11/2021, 7:47 PM  Addendum 8:15 pm RN requested an order for bilateral wrist restraints since patient is encephalopathic and pulls out lines and devices Will place the order and try to use only for a short time till medications work  Addendum 11:10 pm Nausea . Qtc is 430. Zofran ordered

## 2021-01-11 NOTE — Progress Notes (Signed)
[facility] Progress Note  Name: Tejay Hubert MRN: [id number] DOB: 19-Nov-1965    Subjective:  Does not appear to have received HD yesterday ? 1.2 liters of urine-  numbers stable    Review of systems:  Unable to reliably obtain 2/2 ams  Denies shortness of breath or chest pain Denies n/v ---------------------- Background on consult:  Yakir Wenke is a 55 y.o. male  with limited history who presented to the ER after EMS was called to his group home.  EMS reported that his roommate found him having full body convulsions and then he had altered mental status and was combative.  Also per chart review EMS indicates that he roommate had reported atypical behavior a few days prior as well.  Though per charting he was found to have, it is not sure how long he was down.  He was found to have AKI which is progressively worsening and has also had a fever and was found to have rhabdomyolysis.  He had an LP which had gram stain with GPC in clusters per ID consult - they are concerned may have been a contaminant.  He was initiated on antibiotics but infectious disease has stopped empiric antibiotics as of 12/22 note.  Earlier in his course he received vanc, cefepime, unasyn, and ceftriaxone since his admission.  They were concerned that he may have a viral process with rhabdo and seizure.  Renal US on 12/21 with no hydro and echogenicity within normal limits.  He is covid negative.  He has been on LR at 250 ml/hr.  Yesterday foley was inserted and he had immediate return of urine with 500 ml over a 12 hour period per nursing-  has been nonoliguric.          Intake/Output Summary (Last 24 hours) at 01/11/2021 0712 Last data filed at 01/11/2021 8657 Gross per 24 hour  Intake 1849.03 ml  Output 1240 ml  Net 609.03 ml    Vitals:  Vitals:   01/11/21 0326 01/11/21 0400 01/11/21 0500 01/11/21 0600  BP:  139/84 (!) 147/102 (!) 147/94  Pulse:  61 61 60  Resp:  20 (!) 21 19  Temp: 97.7 F  (36.5 C)     TempSrc: Oral     SpO2:  99% 100% 99%  Weight:      Height:         Physical Exam:  General adult male in bed on room air, sleeping but awoke easily "char[place]" denies SOB HEENT normocephalic atraumatic extraocular movements intact sclera anicteric Neck supple trachea midline Lungs crackles on auscultation; unlabored on room air  Heart S1S2 no rub Abdomen soft nontender nondistended Extremities  + pitting edema  Psych he is calm on exam but is on precedex Neuro - awakes with my exam but sleepy; oriented to person and states he is in a hospital GU foley catheter in place Access: RIJ nontunneled catheter  Medications reviewed   Labs:  BMP Latest Ref Rng & Units 01/11/2021 01/10/2021 01/09/2021  Glucose 70 - 99 mg/dL 846(N) 629(B) 284(X)  BUN 6 - 20 mg/dL 32(G) 40(N) 02(V)  Creatinine 0.61 - 1.24 mg/dL 2.53(G) 6.44(I) 3.47(Q)  Sodium 135 - 145 mmol/L 144 142 142  Potassium 3.5 - 5.1 mmol/L 4.5 4.1 4.6  Chloride 98 - 111 mmol/L 104 107 110  CO2 22 - 32 mmol/L 24 24 17(L)  Calcium 8.9 - 10.3 mg/dL 7.2(L) 7.2(L) 7.2(L)     Assessment/Plan:   # AKI  - Secondary to rhabdomyolysis  and pigment nephropathy.  Large Hb and 0-5 RBC. 100 mg/dL protein.  CK trending down. Up/cr ratio 1010 mg/g.  Nontunneled catheter placed on 12/24 by critical care - on bicarb gtt - continue for now - have placed order for daily renal panel - HD was never done-  non oliguric and numbers stable-  will hold HD today and follow - continue foley     # Rhabdomyolysis  - s/p fluid resuscitation; transitioned to bicarb gtt -  was trending down- will check CK tomorrow    # Metabolic acidosis  - setting of AKI; lactic acidosis resolved - has been on bicarb and await updated labs-  bicarb OK   # Seizure  - per neurology and critical care     # Encephalopathy  - setting of seizure and aki and initially covered with abx as concern for infection - appreciate primary team and neuro  -  optimize renal function and acidosis as above   # Transaminitis  - improving last check; hopefully renal function will follow suit    # Hypocalcemia - improves with correction for albumin  - s/p calcium gluconate  HTN/volume-  18 liters positive and has edema-  will add lasix to the bicarb drip to keep volume status even to try and avoid HD    Cecille Aver, MD 01/11/2021 7:12 AM

## 2021-01-12 LAB — HEPATIC FUNCTION PANEL
ALT: 241 U/L — ABNORMAL HIGH (ref 0–44)
AST: 172 U/L — ABNORMAL HIGH (ref 15–41)
Albumin: 2.3 g/dL — ABNORMAL LOW (ref 3.5–5.0)
Alkaline Phosphatase: 49 U/L (ref 38–126)
Bilirubin, Direct: <0.1 mg/dL (ref 0.0–0.2)
Total Bilirubin: 0.8 mg/dL (ref 0.3–1.2)
Total Protein: 5.5 g/dL — ABNORMAL LOW (ref 6.5–8.1)

## 2021-01-12 LAB — CBC
HCT: 26.8 % — ABNORMAL LOW (ref 39.0–52.0)
Hemoglobin: 9.4 g/dL — ABNORMAL LOW (ref 13.0–17.0)
MCH: 33 pg (ref 26.0–34.0)
MCHC: 35.1 g/dL (ref 30.0–36.0)
MCV: 94 fL (ref 80.0–100.0)
Platelets: 134 10*3/uL — ABNORMAL LOW (ref 150–400)
RBC: 2.85 MIL/uL — ABNORMAL LOW (ref 4.22–5.81)
RDW: 14.6 % (ref 11.5–15.5)
WBC: 8.5 10*3/uL (ref 4.0–10.5)
nRBC: 0 % (ref 0.0–0.2)

## 2021-01-12 LAB — GLUCOSE, CAPILLARY
Glucose-Capillary: 122 mg/dL — ABNORMAL HIGH (ref 70–99)
Glucose-Capillary: 62 mg/dL — ABNORMAL LOW (ref 70–99)
Glucose-Capillary: 117 mg/dL — ABNORMAL HIGH (ref 70–99)
Glucose-Capillary: 117 mg/dL — ABNORMAL HIGH (ref 70–99)
Glucose-Capillary: 125 mg/dL — ABNORMAL HIGH (ref 70–99)
Glucose-Capillary: 192 mg/dL — ABNORMAL HIGH (ref 70–99)

## 2021-01-12 LAB — RENAL FUNCTION PANEL
Albumin: 2.3 g/dL — ABNORMAL LOW (ref 3.5–5.0)
Anion gap: 11 (ref 5–15)
BUN: 76 mg/dL — ABNORMAL HIGH (ref 6–20)
CO2: 31 mmol/L (ref 22–32)
Calcium: 6.8 mg/dL — ABNORMAL LOW (ref 8.9–10.3)
Chloride: 100 mmol/L (ref 98–111)
Creatinine, Ser: 7.2 mg/dL — ABNORMAL HIGH (ref 0.61–1.24)
GFR, Estimated: 8 mL/min — ABNORMAL LOW (ref >60)
Glucose, Bld: 124 mg/dL — ABNORMAL HIGH (ref 70–99)
Phosphorus: 5.7 mg/dL — ABNORMAL HIGH (ref 2.5–4.6)
Potassium: 3.5 mmol/L (ref 3.5–5.1)
Sodium: 142 mmol/L (ref 135–145)

## 2021-01-12 LAB — CK: Total CK: 5035 U/L — ABNORMAL HIGH (ref 49–397)

## 2021-01-12 MED ORDER — ORAL CARE MOUTH RINSE
15.0000 mL | Freq: Two times a day (BID) | OROMUCOSAL | Status: DC
  Administered 2021-01-12 – 2021-05-13 (×203): 15 mL via OROMUCOSAL

## 2021-01-12 NOTE — Progress Notes (Signed)
[facility] Progress Note  Name: Daymien Goth MRN: [id number] DOB: [birth year]    Subjective:  2.6 liters of urine-  BUN and crt stable to improved-  trying to wean off the precedex-  sleeping soundly -  did not wake  ---------------------- Background on consult:  Shivan Hodes is a 55 y.o. male  with limited history who presented to the ER after EMS was called to his group home.  EMS reported that his roommate found him having full body convulsions and then he had altered mental status and was combative.  Also per chart review EMS indicates that he roommate had reported atypical behavior a few days prior as well.  Though per charting he was found to have, it is not sure how long he was down.  He was found to have AKI which is progressively worsening and has also had a fever and was found to have rhabdomyolysis.  He had an LP which had gram stain with GPC in clusters per ID consult - they are concerned may have been a contaminant.  He was initiated on antibiotics but infectious disease has stopped empiric antibiotics as of 12/22 note.  Earlier in his course he received vanc, cefepime, unasyn, and ceftriaxone since his admission.  They were concerned that he may have a viral process with rhabdo and seizure.  Renal US on 12/21 with no hydro and echogenicity within normal limits.  He is covid negative.  He has been on LR at 250 ml/hr.  Yesterday foley was inserted and he had immediate return of urine with 500 ml over a 12 hour period per nursing-  has been nonoliguric.          Intake/Output Summary (Last 24 hours) at 01/12/2021 0713 Last data filed at 01/12/2021 3419 Gross per 24 hour  Intake 1952.84 ml  Output 2610 ml  Net -657.16 ml    Vitals:  Vitals:   01/12/21 0300 01/12/21 0338 01/12/21 0400 01/12/21 0600  BP:   (!) 147/89 (!) 158/91  Pulse: 60  (!) 57 (!) 55  Resp: 18  18 14   Temp:  97.6 F (36.4 C)    TempSrc:  Oral    SpO2: 93%  91% 92%  Weight:      Height:          Physical Exam:  General adult male in bed on 2L-  sleeping soundly-  on precedex HEENT normocephalic atraumatic extraocular movements intact sclera anicteric Neck supple trachea midline Lungs crackles on auscultation; unlabored on room air  Heart S1S2 no rub Abdomen soft nontender nondistended Extremities  + pitting edema  Psych he is calm on exam but is on precedex Neuro - awakes with my exam but sleepy; oriented to person and states he is in a hospital GU foley catheter in place Access: RIJ nontunneled catheter  Medications reviewed   Labs:  BMP Latest Ref Rng & Units 01/11/2021 01/10/2021 01/09/2021  Glucose 70 - 99 mg/dL 01/11/2021) 622(W) 979(G)  BUN 6 - 20 mg/dL 921(J) 94(R) 74(Y)  Creatinine 0.61 - 1.24 mg/dL 81(K) 4.81(E) 5.63(J)  Sodium 135 - 145 mmol/L 144 142 142  Potassium 3.5 - 5.1 mmol/L 4.5 4.1 4.6  Chloride 98 - 111 mmol/L 104 107 110  CO2 22 - 32 mmol/L 24 24 17(L)  Calcium 8.9 - 10.3 mg/dL 7.2(L) 7.2(L) 7.2(L)     Assessment/Plan:   # AKI  - Secondary to rhabdomyolysis and pigment nephropathy.  Large Hb and 0-5 RBC. 100 mg/dL protein.  CK was trending down. Up/cr ratio 1010 mg/g.  Nontunneled catheter placed on 12/24 by critical care - on bicarb gtt - continue for now  - HD was never done-  non oliguric and numbers stable to improved-  will hold HD today and follow but keep dialysis catheter in for now - continue foley as well    # Rhabdomyolysis  - s/p fluid resuscitation; transitioned to bicarb gtt -  CK was trending down- from today pending   # Metabolic acidosis  - setting of AKI; lactic acidosis resolved - has been on bicarb and await updated labs-  bicarb OK   # Seizure  - per neurology and critical care     # Encephalopathy  - setting of seizure and aki and initially covered with abx as concern for infection - appreciate primary team and neuro  - optimize renal function and acidosis as above     # Hypocalcemia - improves with  correction for albumin  - s/p calcium gluconate  HTN/volume-  18 liters positive and has edema-  have added lasix to the bicarb drip to keep volume status even to try and avoid HD    Cecille Aver, MD 01/12/2021 7:13 AM

## 2021-01-12 NOTE — Progress Notes (Signed)
NAME:  Steve [name], MRN:  [id number], DOB:  1965/11/09, LOS: 7 ADMISSION DATE:  01/05/2021, CONSULTATION DATE:  12/20 REFERRING MD:  12/20, CHIEF COMPLAINT:  sepsis and AMS   History of Present Illness:  This is a 55 year old white male with limited medical history on presentation to the emergency room.  He was brought from EMS after being found at a group home.  Reportedly his roommate found him having full body convulsions, followed by altered sensorium and combativeness.  Per report from EMS roommate had been noticing the patient was not acting typical for the prior few days before. On arrival to ER was found to be slightly hypotensive with systolic blood pressure in the 90s, room air saturations low 90s, he was confused, and at times combative.  A CT head was obtained this was negative for acute bleeding but did show prominent fluid space at the left cerebral pontine angle raising possibility of epidermoid or arachnoid cyst. Initial lab work: Serum creatinine of 3.27, abnormal LFTs, white blood cell count 23.7, and lactic acid 7.6.  Blood cultures were sent, empiric antibiotic started, IV fluid resuscitation administered.  Because of multiple metabolic derangements, altered sensorium, and concern about seizure the critical care team was asked to admit.  Pertinent  Medical History  Formal history not available on time of admission.  Home medications notable for Prozac, Neurontin, hydroxyzine and trazodone Significant Hospital Events: Including procedures, antibiotic start and stop dates in addition to other pertinent events   12/20 admitted for acute encephalopathy concern for seizure activity with agitation requiring precedex gtt 12/21 LP shows Staph in CSF - felt to be contaminant 12/21 - spot EEG no seizures 12/22 continuous EEG no seizures 12/24 trialysis catheter placed for HD 12/27 fair output, BUN/Cr stabilizing-still elevated  Interim History / Subjective:  Improved urine  output Awake and alert and interactive Denies specific complaints  Objective   Blood pressure (!) 158/91, pulse (!) 55, temperature 97.6 F (36.4 C), temperature source Oral, resp. rate 14, height 5\' 8"  (1.727 m), weight 82 kg, SpO2 92 %.        Intake/Output Summary (Last 24 hours) at 01/12/2021 0736 Last data filed at 01/12/2021 01/14/2021 Gross per 24 hour  Intake 1952.84 ml  Output 2610 ml  Net -657.16 ml   Filed Weights   01/09/21 0500 01/11/21 0325 01/12/21 0200  Weight: 81.7 kg 83.6 kg 82 kg    Physical Exam  Awake, alert and interactive Oriented to person and place Good air entry bilaterally with no added sounds S1-S2 appreciated  Labs- BUN/creatinine improving-still elevated 84/7.48-76/7.20  Resolved Hospital Problem list     Assessment & Plan:   Acute renal failure Rhabdomyolysis Uremic encephalopathy 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 -On bicarb drip -On Lasix per nephrology -With urine output picking up with Lasix, holding off on dialysis at present -Nephrology following  Acute toxic metabolic encephalopathy -Vimpat per neurology -On Seroquel -Uremia, BUN is stabilizing to improving  Sepsis secondary to aspiration pneumonia -Completed Zosyn -CBC stable  Acute liver injury -Continue to trend LFTs -Levels improving  Rhabdomyolysis -Fluid resuscitated -CK improving  Normocytic anemia -Stable  Echocardiogram -Evidence of right ventricular dysfunction -Possible ASD -Consider repeat transthoracic echo when more stable from encephalopathy  Follow-up on LFT, follow-up on CK There is still a possibility of needing dialysis Has HD catheter in place, renal continues to follow  Best Practice (right click and "Reselect all SmartList Selections" daily)   Diet/type: Regular consistency (see orders) DVT prophylaxis:  prophylactic heparin  GI prophylaxis: N/A Lines: N/A Foley:  N/A Code Status:  full code Last date of multidisciplinary  goals of care discussion [not had due to no family available. Patient was residential drug rehabilitation prior to presentation]  Labs   CBC: Recent Labs  Lab 01/05/21 1538 01/06/21 0550 01/07/21 0247 01/08/21 0056 01/12/21 0639  WBC 20.3* 12.9* 10.9* 10.4 8.5  HGB 13.6 12.0* 11.8* 11.5* 9.4*  HCT 41.1 35.8* 34.7* 32.6* 26.8*  MCV 99.3 96.2 94.0 93.7 94.0  PLT 321 218 196 163 134*    Basic Metabolic Panel: Recent Labs  Lab 01/08/21 0056 01/09/21 0625 01/10/21 0741 01/11/21 0217 01/12/21 0639  NA 138 142 142 144 142  K 4.8 4.6 4.1 4.5 3.5  CL 111 110 107 104 100  CO2 13* 17* 24 24 31   GLUCOSE 88 128* 131* 109* 124*  BUN 85* 91* 89* 84* 76*  CREATININE 6.17* 7.26* 7.61* 7.48* 7.20*  CALCIUM 6.9* 7.2* 7.2* 7.2* 6.8*  PHOS  --   --  5.1* 4.4 5.7*   GFR: Estimated Creatinine Clearance: 11.2 mL/min (A) (by C-G formula based on SCr of 7.2 mg/dL (H)). Recent Labs  Lab 01/05/21 1538 01/05/21 1751 01/05/21 2137 01/06/21 0550 01/07/21 0247 01/08/21 0056 01/08/21 1425 01/12/21 0639  PROCALCITON  --   --   --  19.81  --   --   --   --   WBC 20.3*  --   --  12.9* 10.9* 10.4  --  8.5  LATICACIDVEN 2.8* 4.0* 2.4*  --   --   --  1.9  --     Liver Function Tests: Recent Labs  Lab 01/05/21 1342 01/06/21 0550 01/06/21 1402 01/07/21 0247 01/08/21 0056 01/10/21 0741 01/11/21 0217 01/12/21 0639  AST 1,267* 1,688* 1,608* 1,379* 739*  --   --   --   ALT 893* 877* 952* 1,051* 712*  --   --   --   ALKPHOS 90 55 55 64 63  --   --   --   BILITOT 1.0 0.9 0.9 0.6 1.4*  --   --   --   PROT 8.0 5.9* 5.5* 5.6* 5.3*  --   --   --   ALBUMIN 4.1 3.2* 2.9* 2.8* 2.4* 2.2* 2.4* 2.3*   No results for input(s): LIPASE, AMYLASE in the last 168 hours. Recent Labs  Lab 01/07/21 1214  AMMONIA 27    ABG No results found for: PHART, PCO2ART, PO2ART, HCO3, TCO2, ACIDBASEDEF, O2SAT   Coagulation Profile: Recent Labs  Lab 01/05/21 1342  INR 1.2    Cardiac Enzymes: Recent Labs   Lab 01/05/21 1751 01/07/21 0247 01/08/21 0056  CKTOTAL >50,000* 38,455* 23,371*    HbA1C: No results found for: HGBA1C  CBG: Recent Labs  Lab 01/11/21 1520 01/11/21 1907 01/11/21 2307 01/12/21 0308 01/12/21 0724  GLUCAP 102* 108* 108* 122* 62*   The patient is critically ill with multiple organ systems failure and requires high complexity decision making for assessment and support, frequent evaluation and titration of therapies, application of advanced monitoring technologies and extensive interpretation of multiple databases. Critical Care Time devoted to patient care services described in this note independent of APP/resident time (if applicable)  is 33 minutes.   01/14/21 MD [facility] Personal pager: See Amion If unanswered, please page CCM On-call: #[phone]

## 2021-01-12 NOTE — Progress Notes (Signed)
eLink Physician-Brief Progress Note Patient Name: Steve [name] DOB: [birth year] MRN: [id number]   Date of Service  01/12/2021  HPI/Events of Note  Agitation - Request to renew restraint orders.    eICU Interventions  Will renew bilateral soft wrist restraint orders X 12 hours.     Intervention Category Major Interventions: Delirium, psychosis, severe agitation - evaluation and management  [name] 01/12/2021, 8:29 PM

## 2021-01-13 ENCOUNTER — Inpatient Hospital Stay (HOSPITAL_COMMUNITY): Payer: Medicaid Other

## 2021-01-13 DIAGNOSIS — L989 Disorder of the skin and subcutaneous tissue, unspecified: Secondary | ICD-10-CM

## 2021-01-13 DIAGNOSIS — L8990 Pressure ulcer of unspecified site, unspecified stage: Secondary | ICD-10-CM | POA: Insufficient documentation

## 2021-01-13 LAB — GLUCOSE, CAPILLARY
Glucose-Capillary: 128 mg/dL — ABNORMAL HIGH (ref 70–99)
Glucose-Capillary: 108 mg/dL — ABNORMAL HIGH (ref 70–99)

## 2021-01-13 LAB — RENAL FUNCTION PANEL
Albumin: 2.3 g/dL — ABNORMAL LOW (ref 3.5–5.0)
Anion gap: 11 (ref 5–15)
BUN: 70 mg/dL — ABNORMAL HIGH (ref 6–20)
CO2: 31 mmol/L (ref 22–32)
Calcium: 6.7 mg/dL — ABNORMAL LOW (ref 8.9–10.3)
Chloride: 96 mmol/L — ABNORMAL LOW (ref 98–111)
Creatinine, Ser: 7.03 mg/dL — ABNORMAL HIGH (ref 0.61–1.24)
GFR, Estimated: 9 mL/min — ABNORMAL LOW
Glucose, Bld: 130 mg/dL — ABNORMAL HIGH (ref 70–99)
Phosphorus: 4.8 mg/dL — ABNORMAL HIGH (ref 2.5–4.6)
Potassium: 3.5 mmol/L (ref 3.5–5.1)
Sodium: 138 mmol/L (ref 135–145)

## 2021-01-13 LAB — CBC WITH DIFFERENTIAL/PLATELET
Abs Immature Granulocytes: 0.07 10*3/uL (ref 0.00–0.07)
Basophils Absolute: 0 10*3/uL (ref 0.0–0.1)
Basophils Relative: 0 %
Eosinophils Absolute: 0.5 10*3/uL (ref 0.0–0.5)
Eosinophils Relative: 5 %
HCT: 29.3 % — ABNORMAL LOW (ref 39.0–52.0)
Hemoglobin: 10.4 g/dL — ABNORMAL LOW (ref 13.0–17.0)
Immature Granulocytes: 1 %
Lymphocytes Relative: 14 %
Lymphs Abs: 1.4 10*3/uL (ref 0.7–4.0)
MCH: 33.1 pg (ref 26.0–34.0)
MCHC: 35.5 g/dL (ref 30.0–36.0)
MCV: 93.3 fL (ref 80.0–100.0)
Monocytes Absolute: 1.5 10*3/uL — ABNORMAL HIGH (ref 0.1–1.0)
Monocytes Relative: 15 %
Neutro Abs: 6.4 10*3/uL (ref 1.7–7.7)
Neutrophils Relative %: 65 %
Platelets: 158 10*3/uL (ref 150–400)
RBC: 3.14 MIL/uL — ABNORMAL LOW (ref 4.22–5.81)
RDW: 14.3 % (ref 11.5–15.5)
WBC: 9.9 10*3/uL (ref 4.0–10.5)
nRBC: 0 % (ref 0.0–0.2)

## 2021-01-13 IMAGING — DX DG CHEST 1V PORT
1 series · 1 of 1 positions shown · non-contrast
Comparison: [DATE]

CLINICAL DATA: Hypoxia and fever

EXAM:
PORTABLE CHEST 1 VIEW

[chest ap]
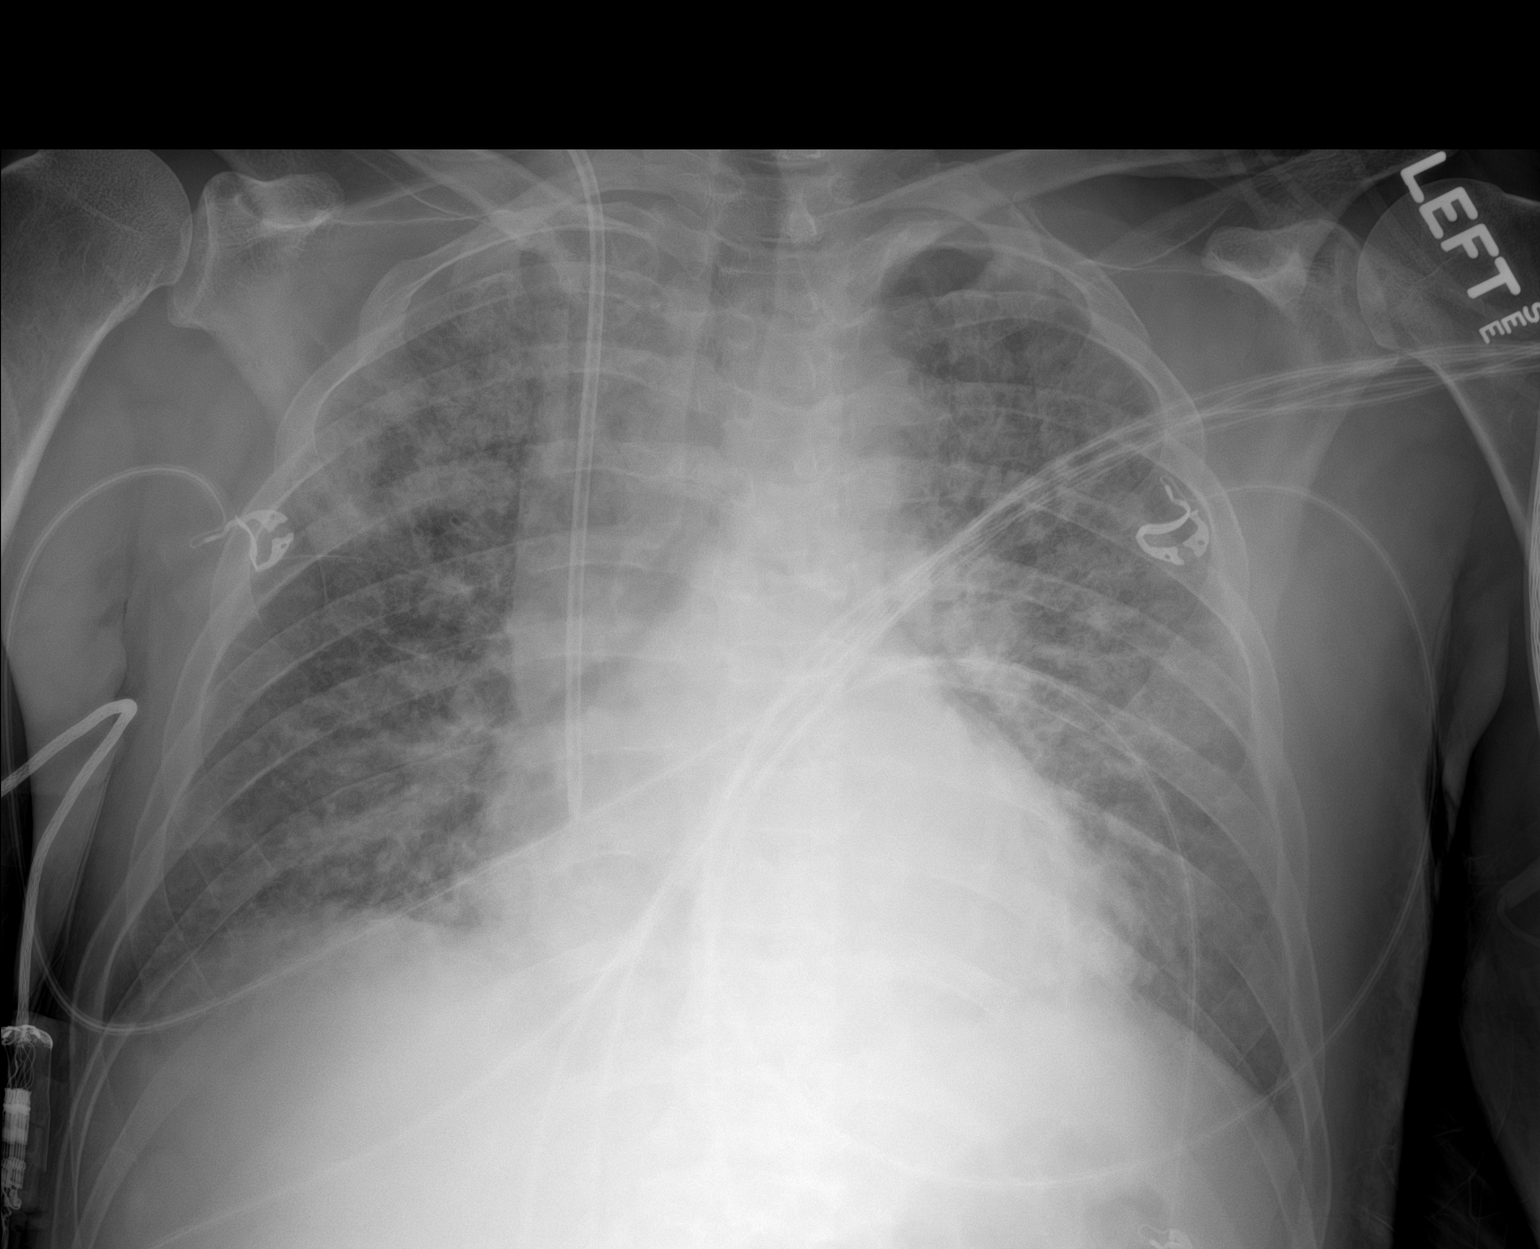

[1 of 1 positions shown; findings below may reference images not displayed]

FINDINGS: Worsening bilateral interstitial opacities. Cardiac contours are
unchanged. No pleural effusion or pneumothorax. Right IJ central
venous catheter tip at the cavoatrial junction.
IMPRESSION: Worsening bilateral interstitial opacities, favored to be pulmonary
edema.

## 2021-01-13 MED ORDER — HEPARIN (PORCINE) 25000 UT/250ML-% IV SOLN
1850.0000 [IU]/h | INTRAVENOUS | Status: DC
Start: 2021-01-13 — End: 2021-01-16
  Administered 2021-01-13: 18:00:00 1350 [IU]/h via INTRAVENOUS
  Administered 2021-01-14: 09:00:00 1500 [IU]/h via INTRAVENOUS
  Administered 2021-01-15: 18:00:00 1700 [IU]/h via INTRAVENOUS
  Administered 2021-01-15: 02:00:00 1650 [IU]/h via INTRAVENOUS
  Administered 2021-01-16: 1850 [IU]/h via INTRAVENOUS
  Filled 2021-01-13 (×5): qty 250

## 2021-01-13 MED ORDER — HALOPERIDOL LACTATE 5 MG/ML IJ SOLN
2.0000 mg | Freq: Once | INTRAMUSCULAR | Status: AC
  Administered 2021-01-13: 06:00:00 2 mg via INTRAVENOUS
  Filled 2021-01-13: qty 1

## 2021-01-13 MED ORDER — HEPARIN BOLUS VIA INFUSION
2000.0000 [IU] | Freq: Once | INTRAVENOUS | Status: AC
  Administered 2021-01-13: 18:00:00 2000 [IU] via INTRAVENOUS
  Filled 2021-01-13: qty 2000

## 2021-01-13 MED ORDER — QUETIAPINE FUMARATE 100 MG PO TABS
100.0000 mg | ORAL_TABLET | Freq: Two times a day (BID) | ORAL | Status: DC
  Administered 2021-01-13 – 2021-01-29 (×33): 100 mg via ORAL
  Filled 2021-01-13 (×33): qty 1

## 2021-01-13 MED ORDER — FUROSEMIDE 10 MG/ML IJ SOLN
40.0000 mg | Freq: Once | INTRAMUSCULAR | Status: AC
  Administered 2021-01-13: 04:00:00 40 mg via INTRAVENOUS
  Filled 2021-01-13: qty 4

## 2021-01-13 MED ORDER — MIDAZOLAM HCL 2 MG/2ML IJ SOLN: 2.0000 mg | Freq: Once | INTRAMUSCULAR | Status: AC

## 2021-01-13 MED ORDER — ALBUTEROL SULFATE (2.5 MG/3ML) 0.083% IN NEBU
2.5000 mg | INHALATION_SOLUTION | RESPIRATORY_TRACT | Status: DC | PRN
  Administered 2021-01-13: 03:00:00 2.5 mg via RESPIRATORY_TRACT
  Filled 2021-01-13: qty 3

## 2021-01-13 MED ORDER — FENTANYL CITRATE (PF) 100 MCG/2ML IJ SOLN
12.5000 ug | Freq: Once | INTRAMUSCULAR | Status: AC
  Administered 2021-01-13: 03:00:00 12.5 ug via INTRAVENOUS
  Filled 2021-01-13: qty 2

## 2021-01-13 MED ORDER — MIDAZOLAM HCL 2 MG/2ML IJ SOLN
INTRAMUSCULAR | Status: AC
  Administered 2021-01-13: 07:00:00 2 mg via INTRAVENOUS
  Filled 2021-01-13: qty 2

## 2021-01-13 NOTE — Progress Notes (Addendum)
Clot noted on HD catheter when he was pulled out  Start patient on full dose anticoagulation with heparin Discontinue DVT prophylaxis  Renal dysfunction will preclude CT angio

## 2021-01-13 NOTE — Progress Notes (Signed)
NAME:  Steve [name], MRN:  [id number], DOB:  January 15, 1966, LOS: 8 ADMISSION DATE:  01/05/2021, CONSULTATION DATE:  12/20 REFERRING MD:  12/20, CHIEF COMPLAINT:  sepsis and AMS   History of Present Illness:  This is a 55 year old white male with limited medical history on presentation to the emergency room.  He was brought from EMS after being found at a group home.  Reportedly his roommate found him having full body convulsions, followed by altered sensorium and combativeness.  Per report from EMS roommate had been noticing the patient was not acting typical for the prior few days before. On arrival to ER was found to be slightly hypotensive with systolic blood pressure in the 90s, room air saturations low 90s, he was confused, and at times combative.  A CT head was obtained this was negative for acute bleeding but did show prominent fluid space at the left cerebral pontine angle raising possibility of epidermoid or arachnoid cyst. Initial lab work: Serum creatinine of 3.27, abnormal LFTs, white blood cell count 23.7, and lactic acid 7.6.  Blood cultures were sent, empiric antibiotic started, IV fluid resuscitation administered.  Because of multiple metabolic derangements, altered sensorium, and concern about seizure the critical care team was asked to admit.  Pertinent  Medical History  Formal history not available on time of admission.  Home medications notable for Prozac, Neurontin, hydroxyzine and trazodone Significant Hospital Events: Including procedures, antibiotic start and stop dates in addition to other pertinent events   12/20 admitted for acute encephalopathy concern for seizure activity with agitation requiring precedex gtt 12/21 LP shows Staph in CSF - felt to be contaminant 12/21 - spot EEG no seizures 12/22 continuous EEG no seizures 12/24 trialysis catheter placed for HD 12/27 fair output, BUN/Cr stabilizing-still elevated  Interim History / Subjective:  Improved urine  output Agitated overnight requiring some sedation Did have a fever Chest x-ray with bilateral processes may be related to edema Awake alert and interactive this morning, calm  denies specific complaints  Objective   Blood pressure 139/78, pulse 68, temperature 98 F (36.7 C), temperature source Oral, resp. rate (!) 7, height 5\' 8"  (1.727 m), weight 81.5 kg, SpO2 90 %.        Intake/Output Summary (Last 24 hours) at 01/13/2021 01/15/2021 Last data filed at 01/13/2021 01/15/2021 Gross per 24 hour  Intake 2389 ml  Output 3840 ml  Net -1451 ml   Filed Weights   01/11/21 0325 01/12/21 0200 01/13/21 0500  Weight: 83.6 kg 82 kg 81.5 kg    Physical Exam  Awake, alert and interactive, oriented to person and place S1-S2 appreciated Good air entry bilaterally, some rales at the bases Peripheral edema  Labs- BUN/creatinine improving-still elevated 84/7.48-76/7.20-70/7.03 Chest x-ray reviewed by myself-bilateral haziness No leukocytosis Isolated fever-up to 102 -Respiratory culture 12/22-negative  Resolved Hospital Problem list     Assessment & Plan:   Acute renal failure Rhabdomyolysis Uremic encephalopathy 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 -BUN/creatinine is better -Holding off on dialysis -Nephrology continues to follow, discussed with Dr. 06-26-1978 this morning  Acute toxic metabolic encephalopathy -Continue Vimpat -Increase Seroquel 200 twice daily -Uremia, BUN stabilizing to improving  Aspiration pneumonia -Completed Zosyn -CBC remained stable -Isolated fever 102 on 12/27, will monitor  Acute liver injury -Continue to trend LFTs -Levels improving  Rhabdomyolysis -Patient adequately fluid resuscitated -Creatinine is trending down, still high  Normocytic anemia -Stable  Evidence of right ventricular dysfunction and recent echocardiogram -Possible ASD -Consider repeat transthoracic echo when  more stable from encephalopathy  Continue to monitor  electrolytes Monitor fever Trend electrolytes Remove HD catheter Remove Foley  Patient still with significant risk of decompensation, requires critical care  Best Practice (right click and "Reselect all SmartList Selections" daily)   Diet/type: Regular consistency (see orders) DVT prophylaxis: prophylactic heparin  GI prophylaxis: N/A Lines: N/A Foley:  N/A Code Status:  full code Last date of multidisciplinary goals of care discussion [not had due to no family available. Patient was residential drug rehabilitation prior to presentation]  Labs   CBC: Recent Labs  Lab 01/07/21 0247 01/08/21 0056 01/12/21 0639 01/13/21 0248  WBC 10.9* 10.4 8.5 9.9  NEUTROABS  --   --   --  6.4  HGB 11.8* 11.5* 9.4* 10.4*  HCT 34.7* 32.6* 26.8* 29.3*  MCV 94.0 93.7 94.0 93.3  PLT 196 163 134* 158    Basic Metabolic Panel: Recent Labs  Lab 01/09/21 0625 01/10/21 0741 01/11/21 0217 01/12/21 0639 01/13/21 0241  NA 142 142 144 142 138  K 4.6 4.1 4.5 3.5 3.5  CL 110 107 104 100 96*  CO2 17* 24 24 31 31   GLUCOSE 128* 131* 109* 124* 130*  BUN 91* 89* 84* 76* 70*  CREATININE 7.26* 7.61* 7.48* 7.20* 7.03*  CALCIUM 7.2* 7.2* 7.2* 6.8* 6.7*  PHOS  --  5.1* 4.4 5.7* 4.8*   GFR: Estimated Creatinine Clearance: 11.5 mL/min (A) (by C-G formula based on SCr of 7.03 mg/dL (H)). Recent Labs  Lab 01/07/21 0247 01/08/21 0056 01/08/21 1425 01/12/21 0639 01/13/21 0248  WBC 10.9* 10.4  --  8.5 9.9  LATICACIDVEN  --   --  1.9  --   --     Liver Function Tests: Recent Labs  Lab 01/06/21 1402 01/07/21 0247 01/08/21 0056 01/10/21 0741 01/11/21 0217 01/12/21 0639 01/12/21 0953 01/13/21 0241  AST 1,608* 1,379* 739*  --   --   --  172*  --   ALT 952* 1,051* 712*  --   --   --  241*  --   ALKPHOS 55 64 63  --   --   --  49  --   BILITOT 0.9 0.6 1.4*  --   --   --  0.8  --   PROT 5.5* 5.6* 5.3*  --   --   --  5.5*  --   ALBUMIN 2.9* 2.8* 2.4* 2.2* 2.4* 2.3* 2.3* 2.3*   No results for  input(s): LIPASE, AMYLASE in the last 168 hours. Recent Labs  Lab 01/07/21 1214  AMMONIA 27    ABG No results found for: PHART, PCO2ART, PO2ART, HCO3, TCO2, ACIDBASEDEF, O2SAT   Coagulation Profile: No results for input(s): INR, PROTIME in the last 168 hours.   Cardiac Enzymes: Recent Labs  Lab 01/07/21 0247 01/08/21 0056 01/12/21 0953  CKTOTAL 38,455* 23,371* 5,035*    HbA1C: No results found for: HGBA1C  CBG: Recent Labs  Lab 01/12/21 0724 01/12/21 0851 01/12/21 1106 01/12/21 1522 01/12/21 1915  GLUCAP 62* 117* 117* 125* 192*   The patient is critically ill with multiple organ systems failure and requires high complexity decision making for assessment and support, frequent evaluation and titration of therapies, application of advanced monitoring technologies and extensive interpretation of multiple databases. Critical Care Time devoted to patient care services described in this note independent of APP/resident time (if applicable)  is 32 minutes.   01/14/21 MD  Pulmonary Critical Care Personal pager: See Amion If unanswered, please page CCM On-call: #  [phone] °

## 2021-01-13 NOTE — Progress Notes (Signed)
eLink Physician-Brief Progress Note Patient Name: Steve [name] DOB: October 24, 1965 MRN: [id number]   Date of Service  01/13/2021  HPI/Events of Note  Agitation - Nurse not able to leave the room. QTc interval = 0.40 seconds.   eICU Interventions  Plan: Haldol 2 mg IV X 1 now.      Intervention Category Major Interventions: Delirium, psychosis, severe agitation - evaluation and management  Bridney Guadarrama Eugene 01/13/2021, 5:34 AM

## 2021-01-13 NOTE — Progress Notes (Signed)
Pt developed new fever of 102.4, pt also having agitation and is refusing ice bags. Educated pt on rationale for ice bags and reminded him that he could have breaks from them if they became too uncomfortable. Pt still adamantly refusing. Pt unable to take tylenol d/t elevated LFTs. Pt blankets removed and room temp decreased to 56F. Temp has trended down to 102. Will continue to monitor and encourage pt to wear ice bags.

## 2021-01-13 NOTE — Progress Notes (Signed)
[facility] Progress Note  Name: Steve [name] MRN: [id number] DOB: 06-04-1965    Subjective:  3.8 liters of urine, 1.8 liters negative-  BUN and crt stable to improved-  had fever to 102-  agitation and CXR showing worsening bilateral opacities-  possible edema so given extra dose of lasix.  This AM is calm- still on precedex   ---------------------- Background on consult:  Steve [name] is a 55 y.o. male  with limited history who presented to the ER after EMS was called to his group home.  EMS reported that his roommate found him having full body convulsions and then he had altered mental status and was combative.  Also per chart review EMS indicates that he roommate had reported atypical behavior a few days prior as well.  Though per charting he was found to have, it is not sure how long he was down.  He was found to have AKI which is progressively worsening and has also had a fever and was found to have rhabdomyolysis.  He had an LP which had gram stain with GPC in clusters per ID consult - they are concerned may have been a contaminant.  He was initiated on antibiotics but infectious disease has stopped empiric antibiotics as of 12/22 note.  Earlier in his course he received vanc, cefepime, unasyn, and ceftriaxone since his admission.  They were concerned that he may have a viral process with rhabdo and seizure.  Renal US on 12/21 with no hydro and echogenicity within normal limits.  He is covid negative.  He has been on LR at 250 ml/hr.  Yesterday foley was inserted and he had immediate return of urine with 500 ml over a 12 hour period per nursing-  has been nonoliguric.          Intake/Output Summary (Last 24 hours) at 01/13/2021 0732 Last data filed at 01/13/2021 5427 Gross per 24 hour  Intake 2389 ml  Output 3840 ml  Net -1451 ml    Vitals:  Vitals:   01/13/21 0340 01/13/21 0500 01/13/21 0526 01/13/21 0724  BP:      Pulse:      Resp:      Temp: (!) 102 F (38.9  C)  (!) 101.2 F (38.4 C) 98 F (36.7 C)  TempSrc: Axillary  Axillary Oral  SpO2:      Weight:  81.5 kg    Height:         Physical Exam:  General adult male in bed on 2L-  sleeping soundly-  on precedex HEENT normocephalic atraumatic extraocular movements intact sclera anicteric Neck supple trachea midline Lungs crackles on auscultation; unlabored on room air  Heart S1S2 no rub Abdomen soft nontender nondistended Extremities  + pitting edema  Psych he is calm on exam but is on precedex Neuro - awakes with my exam but sleepy; oriented to person and states he is in a hospital GU foley catheter in place Access: RIJ nontunneled catheter  Medications reviewed   Labs:  BMP Latest Ref Rng & Units 01/13/2021 01/12/2021 01/11/2021  Glucose 70 - 99 mg/dL 062(B) 762(G) 315(V)  BUN 6 - 20 mg/dL 76(H) 60(V) 37(T)  Creatinine 0.61 - 1.24 mg/dL 0.62(I) 9.48(N) 4.62(V)  Sodium 135 - 145 mmol/L 138 142 144  Potassium 3.5 - 5.1 mmol/L 3.5 3.5 4.5  Chloride 98 - 111 mmol/L 96(L) 100 104  CO2 22 - 32 mmol/L 31 31 24   Calcium 8.9 - 10.3 mg/dL 6.7(L) 6.8(L) 7.2(L)  Assessment/Plan:   # AKI  - Secondary to rhabdomyolysis and pigment nephropathy.  Large Hb and 0-5 RBC. 100 mg/dL protein.  CK was trending down but still 5000. Up/cr ratio 1010 mg/g.  Nontunneled catheter placed on 12/24 by critical care - on bicarb gtt - tank is full and now some hypoxia so will hold ivf even though CK still elevated  - HD was never done-  non oliguric and numbers improving slowly-  will continue to hold HD today and now with fever ccm would like to remove central line and I think that is OK  - remove foley as well    # Rhabdomyolysis  - s/p fluid resuscitation; transitioned to bicarb gtt -  CK trending down- even though still 5000 will stop IVF   # Metabolic acidosis  - setting of AKI; lactic acidosis---  resolved    # Seizure  - per neurology and critical care     # Encephalopathy  - setting of  seizure and aki and initially covered with abx as concern for infection - appreciate primary team and neuro     # Hypocalcemia - improves with correction for albumin  - s/p calcium gluconate  HTN/volume-  16 liters positive and has edema-  responding to lasix- will stop bicarb drip as above     Cecille Aver, MD 01/13/2021 7:32 AM

## 2021-01-13 NOTE — Progress Notes (Signed)
eLink Physician-Brief Progress Note Patient Name: Steve [name] DOB: 23-Jan-1965 MRN: [id number]   Date of Service  01/13/2021  HPI/Events of Note  Patient c/o back pain. Too early to get Oxycodone IR that has been ordered.   eICU Interventions  Plan: Fentanyl 12.5 mcg IV X 1 now.      Intervention Category Major Interventions: Other:  Lenell Antu 01/13/2021, 1:43 AM

## 2021-01-13 NOTE — Progress Notes (Addendum)
eLink Physician-Brief Progress Note Patient Name: Steve [name] DOB: 02/26/1965 MRN: [id number]   Date of Service  01/13/2021  HPI/Events of Note  Agitation - Haldol had no effect on patient's agitation. Trying to get OOB.  eICU Interventions  Plan: Versed 2 mg IV X 1 now.      Intervention Category Major Interventions: Delirium, psychosis, severe agitation - evaluation and management  Tamerra Merkley Eugene 01/13/2021, 6:41 AM

## 2021-01-13 NOTE — Progress Notes (Signed)
eLink Physician-Brief Progress Note Patient Name: Steve [name] DOB: 1965-10-05 MRN: [id number]   Date of Service  01/13/2021  HPI/Events of Note  Multiple issues: 1. Agitation and 2. Review of portable CXR reveals worsening bilateral interstitial opacities, favored to be pulmonary edema.Currently on Lasix 40 mg IV Q 12 hours. WBC count = 9.9.  eICU Interventions  Plan: Increase ceiling on Precedex IV infusion to 1.4 mcg/kg/hour. Lasix 40 mg IV X 1 now.      Intervention Category Major Interventions: Delirium, psychosis, severe agitation - evaluation and management;Hypoxemia - evaluation and management  Lenell Antu 01/13/2021, 3:42 AM

## 2021-01-13 NOTE — Progress Notes (Addendum)
eLink Physician-Brief Progress Note Patient Name: Steve [name] DOB: November 06, 1965 MRN: [id number]   Date of Service  01/13/2021  HPI/Events of Note  Fever to 102.4 F - AST and ALT both elevated. Therefore, can't have Tylenol. Last WBC = 8.5.  eICU Interventions  Plan: Ice packs PRN.  CBC with platelets now.  Portable CXR STAT. Will defer further w/u of fever to day rounding team.      Intervention Category Major Interventions: Other:  Steve [name] 01/13/2021, 2:07 AM

## 2021-01-13 NOTE — Progress Notes (Signed)
ANTICOAGULATION CONSULT NOTE - Initial Consult  Pharmacy Consult for IV Heparin Indication:  Clot at HD cath site  Not on File  Patient Measurements: Height: 5\' 8"  (172.7 cm) Weight: 81.5 kg (179 lb 10.8 oz) IBW/kg (Calculated) : 68.4 Heparin Dosing Weight: 81.5 kg  Vital Signs: Temp: 98.3 F (36.8 C) (12/28 1103) Temp Source: Oral (12/28 1103) BP: 153/82 (12/28 1400) Pulse Rate: 54 (12/28 1400)  Labs: Recent Labs    01/11/21 0217 01/12/21 0639 01/12/21 0953 01/13/21 0241 01/13/21 0248  HGB  --  9.4*  --   --  10.4*  HCT  --  26.8*  --   --  29.3*  PLT  --  134*  --   --  158  CREATININE 7.48* 7.20*  --  7.03*  --   CKTOTAL  --   --  5,035*  --   --     Estimated Creatinine Clearance: 11.5 mL/min (A) (by C-G formula based on SCr of 7.03 mg/dL (H)).   Medical History: No past medical history on file.  Medications:  Infusions:   sodium chloride Stopped (01/08/21 2354)   dexmedetomidine (PRECEDEX) IV infusion 0.9 mcg/kg/hr (01/13/21 1500)   lacosamide (VIMPAT) IV 100 mg (01/13/21 1118)    Assessment: 55 years of age male admitted for sepsis, altered mental status, and AKI. Patient had HD catheter in place for possible need for HD but this was removed today due to ongoing fever. At time of removal and clot was noted and pharmacy was consulted to start IV Heparin.   H/H is low (10.4/29.3)- stable. Platelets 158. Patient has been on SQ Heparin and last dose was at 1445 PM. SCr is significantly elevated at 7 but trending down and patient is making good amount of urine. No bleeding reported.   Goal of Therapy:  Heparin level 0.3-0.7 units/ml Monitor platelets by anticoagulation protocol: Yes   Plan:  Give 2000  units bolus x 1 Start heparin infusion at 1350 units/hr Check anti-Xa level in 6 hours and daily while on heparin Continue to monitor H&H and platelets  08-08-1996, PharmD, BCPS, BCCCP Clinical Pharmacist Please refer to [facility] for [facility]  numbers 01/13/2021,3:32 PM

## 2021-01-14 LAB — RENAL FUNCTION PANEL
Albumin: 2.2 g/dL — ABNORMAL LOW (ref 3.5–5.0)
Anion gap: 15 (ref 5–15)
BUN: 62 mg/dL — ABNORMAL HIGH (ref 6–20)
CO2: 29 mmol/L (ref 22–32)
Calcium: 6.9 mg/dL — ABNORMAL LOW (ref 8.9–10.3)
Chloride: 93 mmol/L — ABNORMAL LOW (ref 98–111)
Creatinine, Ser: 6.31 mg/dL — ABNORMAL HIGH (ref 0.61–1.24)
GFR, Estimated: 10 mL/min — ABNORMAL LOW (ref >60)
Glucose, Bld: 100 mg/dL — ABNORMAL HIGH (ref 70–99)
Phosphorus: 5.4 mg/dL — ABNORMAL HIGH (ref 2.5–4.6)
Potassium: 3.2 mmol/L — ABNORMAL LOW (ref 3.5–5.1)
Sodium: 137 mmol/L (ref 135–145)

## 2021-01-14 LAB — CBC
HCT: 31.2 % — ABNORMAL LOW (ref 39.0–52.0)
Hemoglobin: 11.3 g/dL — ABNORMAL LOW (ref 13.0–17.0)
MCH: 33.6 pg (ref 26.0–34.0)
MCHC: 36.2 g/dL — ABNORMAL HIGH (ref 30.0–36.0)
MCV: 92.9 fL (ref 80.0–100.0)
Platelets: 191 10*3/uL (ref 150–400)
RBC: 3.36 MIL/uL — ABNORMAL LOW (ref 4.22–5.81)
RDW: 14 % (ref 11.5–15.5)
WBC: 8.8 10*3/uL (ref 4.0–10.5)
nRBC: 0 % (ref 0.0–0.2)

## 2021-01-14 LAB — HEPARIN LEVEL (UNFRACTIONATED)
Heparin Unfractionated: 0.22 IU/mL — ABNORMAL LOW (ref 0.30–0.70)
Heparin Unfractionated: 0.23 IU/mL — ABNORMAL LOW (ref 0.30–0.70)
Heparin Unfractionated: 0.34 IU/mL (ref 0.30–0.70)

## 2021-01-14 LAB — GLUCOSE, CAPILLARY
Glucose-Capillary: 102 mg/dL — ABNORMAL HIGH (ref 70–99)
Glucose-Capillary: 108 mg/dL — ABNORMAL HIGH (ref 70–99)
Glucose-Capillary: 118 mg/dL — ABNORMAL HIGH (ref 70–99)
Glucose-Capillary: 93 mg/dL (ref 70–99)
Glucose-Capillary: 106 mg/dL — ABNORMAL HIGH (ref 70–99)
Glucose-Capillary: 103 mg/dL — ABNORMAL HIGH (ref 70–99)

## 2021-01-14 MED ORDER — BUSPIRONE HCL 15 MG PO TABS
7.5000 mg | ORAL_TABLET | Freq: Two times a day (BID) | ORAL | Status: DC
  Administered 2021-01-14 (×2): 7.5 mg via ORAL
  Filled 2021-01-14 (×2): qty 1

## 2021-01-14 MED ORDER — POTASSIUM CHLORIDE CRYS ER 20 MEQ PO TBCR
30.0000 meq | EXTENDED_RELEASE_TABLET | Freq: Once | ORAL | Status: AC
  Administered 2021-01-14: 12:00:00 30 meq via ORAL
  Filled 2021-01-14: qty 1

## 2021-01-14 MED ORDER — TRAZODONE HCL 50 MG PO TABS
50.0000 mg | ORAL_TABLET | Freq: Every day | ORAL | Status: DC
  Administered 2021-01-14: 23:00:00 50 mg via ORAL
  Filled 2021-01-14: qty 1

## 2021-01-14 MED ORDER — FUROSEMIDE 10 MG/ML IJ SOLN
40.0000 mg | Freq: Every day | INTRAMUSCULAR | Status: DC
  Administered 2021-01-14: 09:00:00 40 mg via INTRAVENOUS
  Filled 2021-01-14: qty 4

## 2021-01-14 MED ORDER — POTASSIUM CHLORIDE CRYS ER 20 MEQ PO TBCR
20.0000 meq | EXTENDED_RELEASE_TABLET | Freq: Once | ORAL | Status: AC
  Administered 2021-01-14: 06:00:00 20 meq via ORAL
  Filled 2021-01-14: qty 1

## 2021-01-14 NOTE — Progress Notes (Signed)
eLink Physician-Brief Progress Note Patient Name: Steve [name] DOB: 08/14/1965 MRN: [id number]   Date of Service  01/14/2021  HPI/Events of Note  Patient has a modest amount of gross  hematuria from possible foley related urethral trauma,  approximately last 15 minutes, bedside RN asking what to do with Heparin gtt.  eICU Interventions  Will monitor bleeding for another 15 minutes and discontinue Heparin gtt if bleeding is persistent.        Thomasene Lot Jaimie Redditt 01/14/2021, 5:53 AM

## 2021-01-14 NOTE — Progress Notes (Signed)
ANTICOAGULATION CONSULT NOTE - Initial Consult  Pharmacy Consult for IV Heparin Indication:  Clot at HD cath site  Not on File  Patient Measurements: Height: 5\' 8"  (172.7 cm) Weight: 81.5 kg (179 lb 10.8 oz) IBW/kg (Calculated) : 68.4 Heparin Dosing Weight: 81.5 kg  Vital Signs: Temp: 97.3 F (36.3 C) (12/29 1110) Temp Source: Axillary (12/29 1110) BP: 159/97 (12/29 0800) Pulse Rate: 69 (12/29 0800)  Labs: Recent Labs    01/12/21 0639 01/12/21 0953 01/13/21 0241 01/13/21 0248 01/13/21 2338 01/14/21 0207 01/14/21 1125  HGB 9.4*  --   --  10.4*  --  11.3*  --   HCT 26.8*  --   --  29.3*  --  31.2*  --   PLT 134*  --   --  158  --  191  --   HEPARINUNFRC  --   --   --   --  0.23*  --  0.22*  CREATININE 7.20*  --  7.03*  --   --  6.31*  --   CKTOTAL  --  5,035*  --   --   --   --   --      Estimated Creatinine Clearance: 12.8 mL/min (A) (by C-G formula based on SCr of 6.31 mg/dL (H)).   Medical History: No past medical history on file.   Assessment: 55 years of age male admitted for sepsis, altered mental status, and AKI. Patient had HD catheter in place for possible need for HD but this was removed today due to ongoing fever. At time of removal and clot was noted and pharmacy was consulted to start IV Heparin.   Some hematuria but not worrisome per rn Hep lvl low  Goal of Therapy:  Heparin level 0.3-0.7 units/ml Monitor platelets by anticoagulation protocol: Yes   Plan:  Increase heparin to 1650 units/hr Recheck this evening  53, PharmD, BCCCP Clinical Pharmacist [phone]  Please check AMION for all [facility] Pharmacy numbers  01/14/2021 12:53 PM

## 2021-01-14 NOTE — Progress Notes (Signed)
Vial used for patient's Vimpat dose was pulled from the Narcotics Vault due to issues with the IV room pyxis. Upon completion of the drip, 13mL of drug was wasted in stericycle outside of cleanroom.  Thanks,  Leilani Merl, [facility]

## 2021-01-14 NOTE — Progress Notes (Signed)
[facility] Progress Note  Name: Steve [name] MRN: [id number] DOB: [birth year]    Subjective:  Patient feels like his edema is improving.  Having some anxiety but otherwise no significant issues.  Creatinine slowly improving down to 6.3 with excellent urine output of 5 L  ---------------------- Background on consult:  Bernis Schreur is a 55 y.o. male  with limited history who presented to the ER after EMS was called to his group home.  EMS reported that his roommate found him having full body convulsions and then he had altered mental status and was combative.  Also per chart review EMS indicates that he roommate had reported atypical behavior a few days prior as well.  Though per charting he was found to have, it is not sure how long he was down.  He was found to have AKI which is progressively worsening and has also had a fever and was found to have rhabdomyolysis.  He had an LP which had gram stain with GPC in clusters per ID consult - they are concerned may have been a contaminant.  He was initiated on antibiotics but infectious disease has stopped empiric antibiotics as of 12/22 note.  Earlier in his course he received vanc, cefepime, unasyn, and ceftriaxone since his admission.  They were concerned that he may have a viral process with rhabdo and seizure.  Renal US on 12/21 with no hydro and echogenicity within normal limits.  He is covid negative.  He has been on LR at 250 ml/hr.  Yesterday foley was inserted and he had immediate return of urine with 500 ml over a 12 hour period per nursing-  has been nonoliguric.          Intake/Output Summary (Last 24 hours) at 01/14/2021 1019 Last data filed at 01/14/2021 0654 Gross per 24 hour  Intake 558.98 ml  Output 4280 ml  Net -3721.02 ml    Vitals:  Vitals:   01/14/21 0400 01/14/21 0500 01/14/21 0700 01/14/21 0800  BP: 132/83 (!) 168/97 (!) 148/89 (!) 159/97  Pulse: 65 67 70 69  Resp: (!) 25 13 16 15   Temp:   98.2 F  (36.8 C)   TempSrc:   Oral   SpO2:   95% 97%  Weight:      Height:         Physical Exam:  General lying in bed, some anxiety but no significant distress HEENT normocephalic atraumatic, sclera anicteric Neck supple trachea midline Lungs bilateral chest rise with no increased work of breathing Heart normal rate Abdomen soft nontender nondistended Extremities  + pitting edema in all 4 extremities, improved Psych anxious, affect normal Neuro -oriented to person and place GU foley catheter in place  Medications reviewed   Labs:  BMP Latest Ref Rng & Units 01/14/2021 01/13/2021 01/12/2021  Glucose 70 - 99 mg/dL 01/14/2021) 595(G) 387(F)  BUN 6 - 20 mg/dL 643(P) 29(J) 18(A)  Creatinine 0.61 - 1.24 mg/dL 41(Y) 6.06(T) 0.16(W)  Sodium 135 - 145 mmol/L 137 138 142  Potassium 3.5 - 5.1 mmol/L 3.2(L) 3.5 3.5  Chloride 98 - 111 mmol/L 93(L) 96(L) 100  CO2 22 - 32 mmol/L 29 31 31   Calcium 8.9 - 10.3 mg/dL 6.9(L) 6.7(L) 6.8(L)     Assessment/Plan:   # AKI  - Secondary to rhabdomyolysis.  HD catheter was placed but never performed hemodialysis.  Creatinine slowly improving with excellent urine output.  Continue to monitor for recovery    # Rhabdomyolysis  - s/p fluid resuscitation;  transitioned to bicarb gtt -off IV fluids given he is volume overloaded.  Seems to be improving   # Metabolic acidosis  - setting of AKI; lactic acidosis---  resolved    # Seizure  - per neurology and critical care     # Encephalopathy  - setting of seizure and aki and initially covered with abx as concern for infection - appreciate primary team and neuro     # Hypocalcemia - improves with correction for albumin  - s/p calcium gluconate  HTN/volume-great urine output.  Decrease Lasix to daily.  Stop if urine output continues to be robust    Darnell Level, MD 01/14/2021 10:19 AM

## 2021-01-14 NOTE — Progress Notes (Signed)
ANTICOAGULATION CONSULT NOTE  Pharmacy Consult for IV Heparin Indication:  Clot at HD cath site  Not on File  Patient Measurements: Height: 5\' 8"  (172.7 cm) Weight: 81.5 kg (179 lb 10.8 oz) IBW/kg (Calculated) : 68.4 Heparin Dosing Weight: 81.5 kg  Vital Signs: Temp: 97.9 F (36.6 C) (12/28 2316) Temp Source: Oral (12/28 2316) BP: 142/93 (12/28 2000) Pulse Rate: 56 (12/28 2000)  Labs: Recent Labs    01/11/21 0217 01/12/21 01/14/21 01/12/21 0953 01/13/21 0241 01/13/21 0248 01/13/21 2338  HGB  --  9.4*  --   --  10.4*  --   HCT  --  26.8*  --   --  29.3*  --   PLT  --  134*  --   --  158  --   HEPARINUNFRC  --   --   --   --   --  0.23*  CREATININE 7.48* 7.20*  --  7.03*  --   --   CKTOTAL  --   --  5,035*  --   --   --      Estimated Creatinine Clearance: 11.5 mL/min (A) (by C-G formula based on SCr of 7.03 mg/dL (H)).   Medical History: No past medical history on file.  Medications:  Infusions:   sodium chloride Stopped (01/08/21 2354)   dexmedetomidine (PRECEDEX) IV infusion 0.9 mcg/kg/hr (01/13/21 2035)   heparin 1,350 Units/hr (01/13/21 2035)   lacosamide (VIMPAT) IV 100 mg (01/13/21 2103)    Assessment: 55 years of age male admitted for sepsis, altered mental status, and AKI. Patient had HD catheter in place for possible need for HD but this was removed today due to ongoing fever. At time of removal and clot was noted and pharmacy was consulted to start IV Heparin.   H/H is low (10.4/29.3)- stable. Platelets 158. Patient has been on SQ Heparin and last dose was at 1445 PM. SCr is significantly elevated at 7 but trending down and patient is making good amount of urine. No bleeding reported.   12/29 AM update:  Heparin level below goal  Goal of Therapy:  Heparin level 0.3-0.7 units/ml Monitor platelets by anticoagulation protocol: Yes   Plan:  Inc heparin to 1500 units/hr 0900 heparin level  1/30, PharmD, BCPS Clinical Pharmacist Phone:  [phone]

## 2021-01-14 NOTE — Progress Notes (Signed)
eLink Physician-Brief Progress Note Patient Name: Steve [name] DOB: 08-Mar-1965 MRN: [id number]   Date of Service  01/14/2021  HPI/Events of Note  K+  3.2,  Creatinine 6.31.  eICU Interventions  KCL 20 meq po x 1.        [name] 01/14/2021, 5:19 AM

## 2021-01-14 NOTE — Progress Notes (Signed)
ANTICOAGULATION CONSULT NOTE - Follow Up Consult  Pharmacy Consult for Heparin Indication:  HD catheter site clot  Not on File  Patient Measurements: Height: 5\' 8"  (172.7 cm) Weight: 81.5 kg (179 lb 10.8 oz) IBW/kg (Calculated) : 68.4 Heparin Dosing Weight:  81.5 kg  Vital Signs: Temp: 98.3 F (36.8 C) (12/29 1927) Temp Source: Oral (12/29 1927) BP: 112/78 (12/29 1800) Pulse Rate: 69 (12/29 1800)  Labs: Recent Labs    01/12/21 01/14/21 01/12/21 0953 01/13/21 0241 01/13/21 0248 01/13/21 2338 01/14/21 0207 01/14/21 1125 01/14/21 2017  HGB 9.4*  --   --  10.4*  --  11.3*  --   --   HCT 26.8*  --   --  29.3*  --  31.2*  --   --   PLT 134*  --   --  158  --  191  --   --   HEPARINUNFRC  --   --   --   --  0.23*  --  0.22* 0.34  CREATININE 7.20*  --  7.03*  --   --  6.31*  --   --   CKTOTAL  --  5,035*  --   --   --   --   --   --     Estimated Creatinine Clearance: 12.8 mL/min (A) (by C-G formula based on SCr of 6.31 mg/dL (H)).   Assessment: Anticoag: Hep for HD cath clot - Hep level 0.22>>0.34 now in goal range.  Goal of Therapy:  Heparin level 0.3-0.7 units/ml Monitor platelets by anticoagulation protocol: Yes   Plan:  Con't Heparin 1650 units/hr Daily HL and CBC   Chesney Suares S. 01/16/21, PharmD, BCPS Clinical Staff Pharmacist [email or web address]  Merilynn Finland, Merilynn Finland 01/14/2021,9:15 PM

## 2021-01-14 NOTE — Progress Notes (Signed)
eLink Physician-Brief Progress Note Patient Name: Steve [name] DOB: 28-Sep-1965 MRN: [id number]   Date of Service  01/14/2021  HPI/Events of Note  Patient with some restlessness and agitation.  eICU Interventions  Zyprexa Zydis 5 mg sub-lingual x 1 ordered.        Thomasene Lot Camary Sosa 01/14/2021, 1:52 AM

## 2021-01-14 NOTE — Progress Notes (Signed)
NAME:  Steve [name], MRN:  [id number], DOB:  October 07, 1965, LOS: 9 ADMISSION DATE:  01/05/2021, CONSULTATION DATE:  12/20 REFERRING MD:  12/20, CHIEF COMPLAINT:  sepsis and AMS   History of Present Illness:  This is a 54 year old white male with limited medical history on presentation to the emergency room.  He was brought from EMS after being found at a group home.  Reportedly his roommate found him having full body convulsions, followed by altered sensorium and combativeness.  Per report from EMS roommate had been noticing the patient was not acting typical for the prior few days before. On arrival to ER was found to be slightly hypotensive with systolic blood pressure in the 90s, room air saturations low 90s, he was confused, and at times combative.  A CT head was obtained this was negative for acute bleeding but did show prominent fluid space at the left cerebral pontine angle raising possibility of epidermoid or arachnoid cyst. Initial lab work: Serum creatinine of 3.27, abnormal LFTs, white blood cell count 23.7, and lactic acid 7.6.  Blood cultures were sent, empiric antibiotic started, IV fluid resuscitation administered.  Because of multiple metabolic derangements, altered sensorium, and concern about seizure the critical care team was asked to admit.  Pertinent  Medical History  Formal history not available on time of admission.  Home medications notable for Prozac, Neurontin, hydroxyzine and trazodone  Significant Hospital Events: Including procedures, antibiotic start and stop dates in addition to other pertinent events   12/20 admitted for acute encephalopathy concern for seizure activity with agitation requiring precedex gtt 12/21 LP shows Staph in CSF - felt to be contaminant 12/21 - spot EEG no seizures 12/22 continuous EEG no seizures 12/24 trialysis catheter placed for HD 12/27 fair output, BUN/Cr stabilizing-still elevated 12/29 BUN/creatinine improving  Interim History /  Subjective:  Improved urine output Less agitated last night No fever Alert, interactive calm this morning  Objective   Blood pressure (!) 148/89, pulse 70, temperature 98.2 F (36.8 C), temperature source Oral, resp. rate 16, height 5\' 8"  (1.727 m), weight 81.5 kg, SpO2 95 %.        Intake/Output Summary (Last 24 hours) at 01/14/2021 0758 Last data filed at 01/14/2021 0654 Gross per 24 hour  Intake 663.29 ml  Output 4905 ml  Net -4241.71 ml   Filed Weights   01/11/21 0325 01/12/21 0200 01/13/21 0500  Weight: 83.6 kg 82 kg 81.5 kg    Physical Exam  Awake and interactive S1-S2 appreciated Good air entry bilaterally, few rales at the bases Peripheral edema improving  Labs- BUN/creatinine improving-still elevated 84/7.48-76/7.20-70/7.03-62/6.31 Chest x-ray reviewed by myself-bilateral haziness No leukocytosis Respiratory culture 12/22-negative  Resolved Hospital Problem list     Assessment & Plan:   Acute renal failure Rhabdomyolysis Uremic encephalopathy Anion gap and nongap anion gap metabolic acidosis -BUN/creatinine continues to improve -May not likely need dialysis  Probable VTE Hemodialysis catheter did have a clot on it when it was removed -BUN/creatinine precludes CT angio -Abnormal baseline chest x-ray will make V/Q less reliable -On full dose anticoagulation -Should be treated for 3 months  Acute toxic metabolic encephalopathy -Continue Vimpat -Seroquel increased 200 twice a day -Uremia, BUN stabilizing  Aspiration pneumonia -Completed Zosyn -Isolated fever 12/27-has been stable, no associated leukocytosis  Acute liver injury -Continue to trend LFTs -Levels improving  Rhabdomyolysis -Patient adequately fluid resuscitated -Creatinine trending down  Normocytic anemia -Stable  Evidence of right ventricular dysfunction and recent echocardiogram -Possible ASD -Consider repeat transthoracic echo  when more stable from  encephalopathy  Continue to monitor electrolytes Trend electrolytes  Patient still with significant risk of decompensation Still on Precedex  Reinitiate patient's trazodone-states he takes 100 mg at night, will restart at 50 and increase as tolerated BuSpar added for anxiety  Best Practice (right click and "Reselect all SmartList Selections" daily)   Diet/type: Regular consistency (see orders) DVT prophylaxis: prophylactic heparin  GI prophylaxis: N/A Lines: N/A Foley:  N/A Code Status:  full code Last date of multidisciplinary goals of care discussion [not had due to no family available. Patient was residential drug rehabilitation prior to presentation]  Labs   CBC: Recent Labs  Lab 01/08/21 0056 01/12/21 0639 01/13/21 0248 01/14/21 0207  WBC 10.4 8.5 9.9 8.8  NEUTROABS  --   --  6.4  --   HGB 11.5* 9.4* 10.4* 11.3*  HCT 32.6* 26.8* 29.3* 31.2*  MCV 93.7 94.0 93.3 92.9  PLT 163 134* 158 191    Basic Metabolic Panel: Recent Labs  Lab 01/10/21 0741 01/11/21 0217 01/12/21 0639 01/13/21 0241 01/14/21 0207  NA 142 144 142 138 137  K 4.1 4.5 3.5 3.5 3.2*  CL 107 104 100 96* 93*  CO2 24 24 31 31 29   GLUCOSE 131* 109* 124* 130* 100*  BUN 89* 84* 76* 70* 62*  CREATININE 7.61* 7.48* 7.20* 7.03* 6.31*  CALCIUM 7.2* 7.2* 6.8* 6.7* 6.9*  PHOS 5.1* 4.4 5.7* 4.8* 5.4*   GFR: Estimated Creatinine Clearance: 12.8 mL/min (A) (by C-G formula based on SCr of 6.31 mg/dL (H)). Recent Labs  Lab 01/08/21 0056 01/08/21 1425 01/12/21 0639 01/13/21 0248 01/14/21 0207  WBC 10.4  --  8.5 9.9 8.8  LATICACIDVEN  --  1.9  --   --   --     Liver Function Tests: Recent Labs  Lab 01/08/21 0056 01/10/21 0741 01/11/21 0217 01/12/21 0639 01/12/21 0953 01/13/21 0241 01/14/21 0207  AST 739*  --   --   --  172*  --   --   ALT 712*  --   --   --  241*  --   --   ALKPHOS 63  --   --   --  49  --   --   BILITOT 1.4*  --   --   --  0.8  --   --   PROT 5.3*  --   --   --  5.5*  --    --   ALBUMIN 2.4*   < > 2.4* 2.3* 2.3* 2.3* 2.2*   < > = values in this interval not displayed.   No results for input(s): LIPASE, AMYLASE in the last 168 hours. Recent Labs  Lab 01/07/21 1214  AMMONIA 27    ABG No results found for: PHART, PCO2ART, PO2ART, HCO3, TCO2, ACIDBASEDEF, O2SAT   Coagulation Profile: No results for input(s): INR, PROTIME in the last 168 hours.   Cardiac Enzymes: Recent Labs  Lab 01/08/21 0056 01/12/21 0953  CKTOTAL 23,371* 5,035*    HbA1C: No results found for: HGBA1C  CBG: Recent Labs  Lab 01/12/21 1915 01/13/21 1927 01/13/21 2315 01/14/21 0314 01/14/21 0712  GLUCAP 192* 128* 108* 102* 108*   The patient is critically ill with multiple organ systems failure and requires high complexity decision making for assessment and support, frequent evaluation and titration of therapies, application of advanced monitoring technologies and extensive interpretation of multiple databases. Critical Care Time devoted to patient care services described in this note independent of  APP/resident time (if applicable)  is 35 minutes.   Virl Diamond MD [facility] Personal pager: See Amion If unanswered, please page CCM On-call: #[phone]

## 2021-01-15 LAB — GLUCOSE, CAPILLARY
Glucose-Capillary: 127 mg/dL — ABNORMAL HIGH (ref 70–99)
Glucose-Capillary: 107 mg/dL — ABNORMAL HIGH (ref 70–99)
Glucose-Capillary: 141 mg/dL — ABNORMAL HIGH (ref 70–99)
Glucose-Capillary: 161 mg/dL — ABNORMAL HIGH (ref 70–99)
Glucose-Capillary: 104 mg/dL — ABNORMAL HIGH (ref 70–99)

## 2021-01-15 LAB — CBC
HCT: 30 % — ABNORMAL LOW (ref 39.0–52.0)
Hemoglobin: 10.6 g/dL — ABNORMAL LOW (ref 13.0–17.0)
MCH: 33 pg (ref 26.0–34.0)
MCHC: 35.3 g/dL (ref 30.0–36.0)
MCV: 93.5 fL (ref 80.0–100.0)
Platelets: 203 10*3/uL (ref 150–400)
RBC: 3.21 MIL/uL — ABNORMAL LOW (ref 4.22–5.81)
RDW: 14.2 % (ref 11.5–15.5)
WBC: 7.8 10*3/uL (ref 4.0–10.5)
nRBC: 0 % (ref 0.0–0.2)

## 2021-01-15 LAB — HEPATIC FUNCTION PANEL
ALT: 137 U/L — ABNORMAL HIGH (ref 0–44)
AST: 91 U/L — ABNORMAL HIGH (ref 15–41)
Albumin: 2.3 g/dL — ABNORMAL LOW (ref 3.5–5.0)
Alkaline Phosphatase: 48 U/L (ref 38–126)
Bilirubin, Direct: 0.1 mg/dL (ref 0.0–0.2)
Indirect Bilirubin: 0.5 mg/dL (ref 0.3–0.9)
Total Bilirubin: 0.6 mg/dL (ref 0.3–1.2)
Total Protein: 5.4 g/dL — ABNORMAL LOW (ref 6.5–8.1)

## 2021-01-15 LAB — RENAL FUNCTION PANEL
Albumin: 2.2 g/dL — ABNORMAL LOW (ref 3.5–5.0)
Anion gap: 11 (ref 5–15)
BUN: 54 mg/dL — ABNORMAL HIGH (ref 6–20)
CO2: 29 mmol/L (ref 22–32)
Calcium: 6.9 mg/dL — ABNORMAL LOW (ref 8.9–10.3)
Chloride: 100 mmol/L (ref 98–111)
Creatinine, Ser: 5.38 mg/dL — ABNORMAL HIGH (ref 0.61–1.24)
GFR, Estimated: 12 mL/min — ABNORMAL LOW (ref >60)
Glucose, Bld: 106 mg/dL — ABNORMAL HIGH (ref 70–99)
Phosphorus: 4.5 mg/dL (ref 2.5–4.6)
Potassium: 3.2 mmol/L — ABNORMAL LOW (ref 3.5–5.1)
Sodium: 140 mmol/L (ref 135–145)

## 2021-01-15 LAB — CK: Total CK: 2328 U/L — ABNORMAL HIGH (ref 49–397)

## 2021-01-15 LAB — MAGNESIUM: Magnesium: 1.5 mg/dL — ABNORMAL LOW (ref 1.7–2.4)

## 2021-01-15 LAB — HEPARIN LEVEL (UNFRACTIONATED): Heparin Unfractionated: 0.31 IU/mL (ref 0.30–0.70)

## 2021-01-15 MED ORDER — TRAZODONE HCL 100 MG PO TABS
100.0000 mg | ORAL_TABLET | Freq: Every day | ORAL | Status: DC
  Administered 2021-01-15 – 2021-01-19 (×5): 100 mg via ORAL
  Filled 2021-01-15: qty 2
  Filled 2021-01-15 (×4): qty 1

## 2021-01-15 MED ORDER — POTASSIUM CHLORIDE CRYS ER 20 MEQ PO TBCR
40.0000 meq | EXTENDED_RELEASE_TABLET | Freq: Once | ORAL | Status: AC
  Administered 2021-01-15: 10:00:00 40 meq via ORAL
  Filled 2021-01-15: qty 2

## 2021-01-15 MED ORDER — SODIUM CHLORIDE 0.9% FLUSH: 3.0000 mL | INTRAVENOUS | Status: DC | PRN

## 2021-01-15 MED ORDER — OXYCODONE HCL 5 MG PO TABS
10.0000 mg | ORAL_TABLET | Freq: Four times a day (QID) | ORAL | Status: DC | PRN
  Administered 2021-01-15 – 2021-01-19 (×13): 10 mg via ORAL
  Filled 2021-01-15 (×13): qty 2

## 2021-01-15 MED ORDER — SODIUM CHLORIDE 0.9% FLUSH
3.0000 mL | Freq: Two times a day (BID) | INTRAVENOUS | Status: DC
  Administered 2021-01-15 – 2021-02-13 (×51): 3 mL via INTRAVENOUS

## 2021-01-15 MED ORDER — WARFARIN SODIUM 5 MG PO TABS
5.0000 mg | ORAL_TABLET | Freq: Once | ORAL | Status: AC
  Administered 2021-01-15: 18:00:00 5 mg via ORAL
  Filled 2021-01-15: qty 1

## 2021-01-15 MED ORDER — WARFARIN - PHARMACIST DOSING INPATIENT: Freq: Every day | Status: DC

## 2021-01-15 MED ORDER — BISACODYL 5 MG PO TBEC
10.0000 mg | DELAYED_RELEASE_TABLET | Freq: Once | ORAL | Status: AC
  Administered 2021-01-15: 18:00:00 10 mg via ORAL
  Filled 2021-01-15: qty 2

## 2021-01-15 MED ORDER — BUSPIRONE HCL 10 MG PO TABS
10.0000 mg | ORAL_TABLET | Freq: Two times a day (BID) | ORAL | Status: DC
  Administered 2021-01-15 – 2021-03-10 (×110): 10 mg via ORAL
  Filled 2021-01-15 (×111): qty 1

## 2021-01-15 MED ORDER — MAGNESIUM SULFATE 2 GM/50ML IV SOLN
2.0000 g | Freq: Once | INTRAVENOUS | Status: AC
  Administered 2021-01-15: 10:00:00 2 g via INTRAVENOUS
  Filled 2021-01-15: qty 50

## 2021-01-15 NOTE — Progress Notes (Signed)
[facility] Progress Note  Name: Steve [name] MRN: [id number] DOB: 1965/07/26    Subjective:  Patient feels a lot better today.  Edema is improving.  Anxiety improved after a good night sleep.  Great urine output.  Creatinine slowly improving.  ---------------------- Background on consult:  Steve [name] is a 55 y.o. male  with limited history who presented to the ER after EMS was called to his group home.  EMS reported that his roommate found him having full body convulsions and then he had altered mental status and was combative.  Also per chart review EMS indicates that he roommate had reported atypical behavior a few days prior as well.  Though per charting he was found to have, it is not sure how long he was down.  He was found to have AKI which is progressively worsening and has also had a fever and was found to have rhabdomyolysis.  He had an LP which had gram stain with GPC in clusters per ID consult - they are concerned may have been a contaminant.  He was initiated on antibiotics but infectious disease has stopped empiric antibiotics as of 12/22 note.  Earlier in his course he received vanc, cefepime, unasyn, and ceftriaxone since his admission.  They were concerned that he may have a viral process with rhabdo and seizure.  Renal US on 12/21 with no hydro and echogenicity within normal limits.  He is covid negative.  He has been on LR at 250 ml/hr.  Yesterday foley was inserted and he had immediate return of urine with 500 ml over a 12 hour period per nursing-  has been nonoliguric.          Intake/Output Summary (Last 24 hours) at 01/15/2021 1003 Last data filed at 01/15/2021 0800 Gross per 24 hour  Intake 988.07 ml  Output 4250 ml  Net -3261.93 ml    Vitals:  Vitals:   01/15/21 0530 01/15/21 0600 01/15/21 0700 01/15/21 0800  BP: 136/77 131/71 130/76 (!) 130/96  Pulse: 66 62 60 60  Resp: 20 16 11  (!) 29  Temp:   98.2 F (36.8 C)   TempSrc:   Oral   SpO2:  91% 91% 91% 97%  Weight:      Height:         Physical Exam:  General lying in bed, some anxiety but no significant distress HEENT normocephalic atraumatic, sclera anicteric Neck supple trachea midline Lungs bilateral chest rise with no increased work of breathing Heart normal rate Abdomen soft nontender nondistended Extremities  + pitting edema in all 4 extremities, improved Psych anxious, affect normal Neuro -oriented to person and place GU foley catheter in place  Medications reviewed   Labs:  BMP Latest Ref Rng & Units 01/15/2021 01/14/2021 01/13/2021  Glucose 70 - 99 mg/dL 01/15/2021) 459(X) 774(F)  BUN 6 - 20 mg/dL 423(T) 53(U) 02(B)  Creatinine 0.61 - 1.24 mg/dL 34(D) 5.68(S) 1.68(H)  Sodium 135 - 145 mmol/L 140 137 138  Potassium 3.5 - 5.1 mmol/L 3.2(L) 3.2(L) 3.5  Chloride 98 - 111 mmol/L 100 93(L) 96(L)  CO2 22 - 32 mmol/L 29 29 31   Calcium 8.9 - 10.3 mg/dL 6.9(L) 6.9(L) 6.7(L)     Assessment/Plan:   # AKI  - Secondary to rhabdomyolysis.  HD catheter was placed but never performed hemodialysis.  Creatinine slowly improving with excellent urine output.  Continue to monitor for recovery.  Possibly sign off tomorrow if creatinine continues to improve.  Lasix stopped.    #  Rhabdomyolysis  - s/p fluid resuscitation; transitioned to bicarb gtt -off IV fluids given he is volume overloaded.  Seems to be improving   # Metabolic acidosis  - setting of AKI; lactic acidosis---  resolved    # Seizure  - per neurology and critical care     # Encephalopathy  - setting of seizure and aki and initially covered with abx as concern for infection - appreciate primary team and neuro  -Has improved    # Hypocalcemia - improves with correction for albumin  - s/p calcium gluconate  Hypokalemia/hypomagnesemia: Repletion today    Darnell Level, MD 01/15/2021 10:03 AM

## 2021-01-15 NOTE — Progress Notes (Addendum)
ANTICOAGULATION CONSULT NOTE   Pharmacy Consult for IV Heparin + Coumadin Indication:  Clot at HD cath site  Not on File  Patient Measurements: Height: 5\' 8"  (172.7 cm) Weight: 81.5 kg (179 lb 10.8 oz) IBW/kg (Calculated) : 68.4 Heparin Dosing Weight: 81.5 kg  Vital Signs: Temp: 98.2 F (36.8 C) (12/30 0700) Temp Source: Oral (12/30 0700) BP: 130/96 (12/30 0800) Pulse Rate: 60 (12/30 0800)  Labs: Recent Labs     0000 01/12/21 0953 01/13/21 0241 01/13/21 0248 01/13/21 2338 01/14/21 0207 01/14/21 1125 01/14/21 2017 01/15/21 0100 01/15/21 0808  HGB   < >  --   --  10.4*  --  11.3*  --   --  10.6*  --   HCT  --   --   --  29.3*  --  31.2*  --   --  30.0*  --   PLT  --   --   --  158  --  191  --   --  203  --   HEPARINUNFRC  --   --   --   --    < >  --  0.22* 0.34  --  0.31  CREATININE  --   --  7.03*  --   --  6.31*  --   --  5.38*  --   CKTOTAL  --  5,035*  --   --   --   --   --   --  2,328*  --    < > = values in this interval not displayed.    Estimated Creatinine Clearance: 15 mL/min (A) (by C-G formula based on SCr of 5.38 mg/dL (H)).   Medical History: No past medical history on file.   Assessment: 55 years of age male admitted for sepsis, altered mental status, and AKI. Patient had HD catheter in place for possible need for HD but this was removed today due to ongoing fever. At time of removal and clot was noted and pharmacy was consulted to start IV Heparin.   Heparin level therapeutic 0.31 -trending back down.  No issues with infusion and no bleeding.   Discussed with Dr. 53 - starting Coumadin overlap today. INR 1.2 on admission.     Platelets are improving. LFTs improving.   Goal of Therapy:  Heparin level 0.3-0.7 units/ml Monitor platelets by anticoagulation protocol: Yes   Plan:  Increase heparin to 1700 units/hr to keep at goal. Daily Heparin level and CBC while on therapy.  Start Coumadin 5mg  po x1 today Daily PT/INR  Wynona Neat, PharmD, BCPS, BCCCP Clinical Pharmacist  Please check AMION for all [facility] Pharmacy numbers  01/15/2021 9:04 AM

## 2021-01-15 NOTE — Progress Notes (Signed)
Still complaining of significant pain in his extremities  Dose of oxycodone increased to 10 as needed

## 2021-01-15 NOTE — Progress Notes (Signed)
NAME:  Steve [name], MRN:  [id number], DOB:  03-22-1965, LOS: 10 ADMISSION DATE:  01/05/2021, CONSULTATION DATE:  12/20 REFERRING MD:  12/20, CHIEF COMPLAINT:  sepsis and AMS   History of Present Illness:  This is a 55 year old white male with limited medical history on presentation to the emergency room.  He was brought from EMS after being found at a group home.  Reportedly his roommate found him having full body convulsions, followed by altered sensorium and combativeness.  Per report from EMS roommate had been noticing the patient was not acting typical for the prior few days before. On arrival to ER was found to be slightly hypotensive with systolic blood pressure in the 90s, room air saturations low 90s, he was confused, and at times combative.  A CT head was obtained this was negative for acute bleeding but did show prominent fluid space at the left cerebral pontine angle raising possibility of epidermoid or arachnoid cyst. Initial lab work: Serum creatinine of 3.27, abnormal LFTs, white blood cell count 23.7, and lactic acid 7.6.  Blood cultures were sent, empiric antibiotic started, IV fluid resuscitation administered.  Because of multiple metabolic derangements, altered sensorium, and concern about seizure the critical care team was asked to admit.  Pertinent  Medical History  Formal history not available on time of admission.  Home medications notable for Prozac, Neurontin, hydroxyzine and trazodone  Significant Hospital Events: Including procedures, antibiotic start and stop dates in addition to other pertinent events   12/20 admitted for acute encephalopathy concern for seizure activity with agitation requiring precedex gtt 12/21 LP shows Staph in CSF - felt to be contaminant 12/21 - spot EEG no seizures 12/22 continuous EEG no seizures 12/24 trialysis catheter placed for HD 12/27 fair output, BUN/Cr stabilizing-still elevated 12/29 BUN/creatinine improving 12/30 BUN/creatinine  continues to improve  Interim History / Subjective:  Improved urine output Less agitated last night No recurrence of a fever that happened on 12/27 Alert, interactive calm this morning alert, interactive and calm  Objective   Blood pressure (!) 130/96, pulse 60, temperature 98.2 F (36.8 C), temperature source Oral, resp. rate (!) 29, height 5\' 8"  (1.727 m), weight 81.5 kg, SpO2 97 %.        Intake/Output Summary (Last 24 hours) at 01/15/2021 1042 Last data filed at 01/15/2021 0800 Gross per 24 hour  Intake 988.07 ml  Output 4250 ml  Net -3261.93 ml   Filed Weights   01/11/21 0325 01/12/21 0200 01/13/21 0500  Weight: 83.6 kg 82 kg 81.5 kg    Physical Exam  Awake and interactive S1-S2 appreciated Good air entry bilaterally Peripheral edema continues to improve  Labs -BUN/creatinine improving Chest x-ray reviewed by myself showing bilateral haziness No leukocytosis Recent respiratory cultures negative   Resolved Hospital Problem list     Assessment & Plan:   Acute renal failure secondary to rhabdomyolysis Uremic encephalopathy 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 -Does not appear they will need dialysis -BUN/creatinine continues to improve -Continue to trend electrolytes  Probable VTE Hemodialysis cath did have a clot on it when it was removed -I will anticipate 3 months of anticoagulation -Will start on Coumadin  Acute toxic metabolic encephalopathy -Continue Vimpat -Seroquel at 100 twice a day  Agitation/anxiety -BuSpar increased to 10 twice daily -Dose of trazodone at night increased to 100 nightly-Home dose  Aspiration pneumonia -Completed Zosyn -Isolated fever 12/27-has been stable, no associated leukocytosis -No recurrence of the fever  Acute liver injury -Continue to trend  LFTs -Levels improving  Rhabdomyolysis -Patient adequately fluid resuscitated -Creatinine trending down  Normocytic anemia -Stable  Evidence of right  ventricular dysfunction and recent echocardiogram -Possible ASD -Consider repeat transthoracic echo when more stable from encephalopathy  Patient still with significant risk of decompensation Still on Precedex -we will discontinue today Trazodone-100 qhs Increase dose of BuSpar  Best Practice (right click and "Reselect all SmartList Selections" daily)   Diet/type: Regular consistency (see orders) DVT prophylaxis: prophylactic heparin  GI prophylaxis: N/A Lines: N/A Foley:  N/A Code Status:  full code Last date of multidisciplinary goals of care discussion [not had due to no family available. Patient was residential drug rehabilitation prior to presentation]  Labs   CBC: Recent Labs  Lab 01/12/21 0639 01/13/21 0248 01/14/21 0207 01/15/21 0100  WBC 8.5 9.9 8.8 7.8  NEUTROABS  --  6.4  --   --   HGB 9.4* 10.4* 11.3* 10.6*  HCT 26.8* 29.3* 31.2* 30.0*  MCV 94.0 93.3 92.9 93.5  PLT 134* 158 191 203    Basic Metabolic Panel: Recent Labs  Lab 01/11/21 0217 01/12/21 0639 01/13/21 0241 01/14/21 0207 01/15/21 0100 01/15/21 0808  NA 144 142 138 137 140  --   K 4.5 3.5 3.5 3.2* 3.2*  --   CL 104 100 96* 93* 100  --   CO2 24 31 31 29 29   --   GLUCOSE 109* 124* 130* 100* 106*  --   BUN 84* 76* 70* 62* 54*  --   CREATININE 7.48* 7.20* 7.03* 6.31* 5.38*  --   CALCIUM 7.2* 6.8* 6.7* 6.9* 6.9*  --   MG  --   --   --   --   --  1.5*  PHOS 4.4 5.7* 4.8* 5.4* 4.5  --    GFR: Estimated Creatinine Clearance: 15 mL/min (A) (by C-G formula based on SCr of 5.38 mg/dL (H)). Recent Labs  Lab 01/08/21 1425 01/12/21 0639 01/13/21 0248 01/14/21 0207 01/15/21 0100  WBC  --  8.5 9.9 8.8 7.8  LATICACIDVEN 1.9  --   --   --   --     Liver Function Tests: Recent Labs  Lab 01/12/21 0639 01/12/21 0953 01/13/21 0241 01/14/21 0207 01/15/21 0100  AST  --  172*  --   --  91*  ALT  --  241*  --   --  137*  ALKPHOS  --  49  --   --  48  BILITOT  --  0.8  --   --  0.6  PROT  --   5.5*  --   --  5.4*  ALBUMIN 2.3* 2.3* 2.3* 2.2* 2.3*   2.2*   No results for input(s): LIPASE, AMYLASE in the last 168 hours. No results for input(s): AMMONIA in the last 168 hours.   ABG No results found for: PHART, PCO2ART, PO2ART, HCO3, TCO2, ACIDBASEDEF, O2SAT   Coagulation Profile: No results for input(s): INR, PROTIME in the last 168 hours.   Cardiac Enzymes: Recent Labs  Lab 01/12/21 0953 01/15/21 0100  CKTOTAL 5,035* 2,328*    HbA1C: No results found for: HGBA1C  CBG: Recent Labs  Lab 01/14/21 1510 01/14/21 1925 01/14/21 2316 01/15/21 0320 01/15/21 0713  GLUCAP 93 106* 103* 127* 107*   The patient is critically ill with multiple organ systems failure and requires high complexity decision making for assessment and support, frequent evaluation and titration of therapies, application of advanced monitoring technologies and extensive interpretation of multiple databases. Critical Care  Time devoted to patient care services described in this note independent of APP/resident time (if applicable)  is 31 minutes.   Virl Diamond MD [facility] Personal pager: See Amion If unanswered, please page CCM On-call: #[phone]

## 2021-01-16 ENCOUNTER — Inpatient Hospital Stay (HOSPITAL_COMMUNITY): Payer: Medicaid Other

## 2021-01-16 ENCOUNTER — Other Ambulatory Visit: Payer: Self-pay

## 2021-01-16 ENCOUNTER — Encounter (HOSPITAL_COMMUNITY): Payer: Self-pay | Admitting: Internal Medicine

## 2021-01-16 DIAGNOSIS — R609 Edema, unspecified: Secondary | ICD-10-CM

## 2021-01-16 LAB — RENAL FUNCTION PANEL
Albumin: 2.6 g/dL — ABNORMAL LOW (ref 3.5–5.0)
Anion gap: 11 (ref 5–15)
BUN: 39 mg/dL — ABNORMAL HIGH (ref 6–20)
CO2: 28 mmol/L (ref 22–32)
Calcium: 7.6 mg/dL — ABNORMAL LOW (ref 8.9–10.3)
Chloride: 104 mmol/L (ref 98–111)
Creatinine, Ser: 4.35 mg/dL — ABNORMAL HIGH (ref 0.61–1.24)
GFR, Estimated: 15 mL/min — ABNORMAL LOW (ref >60)
Glucose, Bld: 102 mg/dL — ABNORMAL HIGH (ref 70–99)
Phosphorus: 3.5 mg/dL (ref 2.5–4.6)
Potassium: 3.4 mmol/L — ABNORMAL LOW (ref 3.5–5.1)
Sodium: 143 mmol/L (ref 135–145)

## 2021-01-16 LAB — CBC
HCT: 30.2 % — ABNORMAL LOW (ref 39.0–52.0)
Hemoglobin: 10 g/dL — ABNORMAL LOW (ref 13.0–17.0)
MCH: 31.9 pg (ref 26.0–34.0)
MCHC: 33.1 g/dL (ref 30.0–36.0)
MCV: 96.5 fL (ref 80.0–100.0)
Platelets: 252 10*3/uL (ref 150–400)
RBC: 3.13 MIL/uL — ABNORMAL LOW (ref 4.22–5.81)
RDW: 14.6 % (ref 11.5–15.5)
WBC: 8.6 10*3/uL (ref 4.0–10.5)
nRBC: 0 % (ref 0.0–0.2)

## 2021-01-16 LAB — PROTIME-INR
INR: 1 (ref 0.8–1.2)
Prothrombin Time: 13.5 seconds (ref 11.4–15.2)

## 2021-01-16 LAB — HEPARIN LEVEL (UNFRACTIONATED)
Heparin Unfractionated: 0.24 IU/mL — ABNORMAL LOW (ref 0.30–0.70)
Heparin Unfractionated: 0.35 IU/mL (ref 0.30–0.70)

## 2021-01-16 MED ORDER — POTASSIUM CHLORIDE CRYS ER 20 MEQ PO TBCR
20.0000 meq | EXTENDED_RELEASE_TABLET | Freq: Once | ORAL | Status: AC
  Administered 2021-01-16: 20 meq via ORAL
  Filled 2021-01-16: qty 1

## 2021-01-16 MED ORDER — HEPARIN SODIUM (PORCINE) 5000 UNIT/ML IJ SOLN
5000.0000 [IU] | Freq: Three times a day (TID) | INTRAMUSCULAR | Status: DC
  Administered 2021-01-16 – 2021-02-22 (×98): 5000 [IU] via SUBCUTANEOUS
  Filled 2021-01-16 (×105): qty 1

## 2021-01-16 MED ORDER — MAGNESIUM SULFATE 2 GM/50ML IV SOLN
2.0000 g | Freq: Once | INTRAVENOUS | Status: AC
  Administered 2021-01-16: 2 g via INTRAVENOUS
  Filled 2021-01-16: qty 50

## 2021-01-16 MED ORDER — WARFARIN SODIUM 5 MG PO TABS: 5.0000 mg | ORAL_TABLET | Freq: Once | ORAL | Status: DC

## 2021-01-16 MED ORDER — INFLUENZA VAC SPLIT QUAD 0.5 ML IM SUSY
0.5000 mL | PREFILLED_SYRINGE | INTRAMUSCULAR | Status: AC
  Administered 2021-01-21: 0.5 mL via INTRAMUSCULAR
  Filled 2021-01-16: qty 0.5

## 2021-01-16 MED ORDER — POTASSIUM CHLORIDE CRYS ER 20 MEQ PO TBCR
40.0000 meq | EXTENDED_RELEASE_TABLET | Freq: Once | ORAL | Status: AC
  Administered 2021-01-16: 40 meq via ORAL
  Filled 2021-01-16: qty 2

## 2021-01-16 MED ORDER — FLUOXETINE HCL 20 MG PO CAPS
40.0000 mg | ORAL_CAPSULE | Freq: Every day | ORAL | Status: DC
  Administered 2021-01-16 – 2021-05-14 (×119): 40 mg via ORAL
  Filled 2021-01-16 (×122): qty 2

## 2021-01-16 NOTE — Evaluation (Signed)
Occupational Therapy Evaluation Patient Details Name: Steve [name] MRN: [id number] DOB: Feb 18, 1965 Today's Date: 01/16/2021   History of Present Illness Pt is a 55 y/o male who presents from his group home (drug/alcohol rehab?) s/p witnessed seizure and AMS. He was found to have AKI and rhabdomyolysis. No PMH noted in the chart.   Clinical Impression   Patient is currently requiring assistance with ADLs including moderate assist with toileting, total assist with LE dressing with hypersensitivity and pain to Bil Les with LT>RT, moderate assist with LB bathing, and setup assist with seated UE ADLs.  Current level of function is below patient's typical baseline.  During this evaluation, patient was limited by generalized weakness, impaired activity tolerance, and primarily pain to LLE, all of which has the potential to impact patient's safety and independence during functional mobility, as well as performance for ADLs.  Patient reports that he lives with a roommate, who is able to provide 24/7 supervision and assistance, however chart states that pt is in a group home for drug/ETOH rehab. Unsure of pt's planned discharge destination after rehab. Patient demonstrates good rehab potential, and should benefit from continued skilled occupational therapy services while in acute care to maximize safety, independence and quality of life at home.  Continued occupational therapy services in a SNF setting prior to return home is recommended.  ?    Recommendations for follow up therapy are one component of a multi-disciplinary discharge planning process, led by the attending physician.  Recommendations may be updated based on patient status, additional functional criteria and insurance authorization.   Follow Up Recommendations  Skilled nursing-short term rehab (<3 hours/day)    Assistance Recommended at Discharge Frequent or constant Supervision/Assistance  Functional Status Assessment  Patient has had a  recent decline in their functional status and demonstrates the ability to make significant improvements in function in a reasonable and predictable amount of time.  Equipment Recommendations   (Will defer to post-acute rehab recommendations)    Recommendations for Other Services       Precautions / Restrictions Precautions Precautions: Fall Precaution Comments: Appears to be hypersensitive to touch on LE's Restrictions Weight Bearing Restrictions: No      Mobility Bed Mobility Overal bed mobility: Needs Assistance Bed Mobility: Supine to Sit     Supine to sit: Supervision     General bed mobility comments: HOB ~20 elevated. Pt was able to transition to EOB without difficulty however required increased time.    Transfers Overall transfer level: Needs assistance Equipment used: Rolling walker (2 wheels) Transfers: Sit to/from Stand;Bed to chair/wheelchair/BSC Sit to Stand: Min assist;+2 physical assistance;From elevated surface Stand pivot transfers: Min assist;+2 physical assistance;From elevated surface         General transfer comment: +2 to power up to full stand and gain/maintain standing balance. Pt initially unable to maintain standing >10 seconds due to pain in the LLE and urgently sat back down. On second attempt to get to chair, pt was able to hold LLE up in the air for most of the pivot, however again with urgent sit and +2 assist to control lower to the chair due to pain.      Balance Overall balance assessment: Needs assistance Sitting-balance support: Feet supported;No upper extremity supported Sitting balance-Leahy Scale: Poor Sitting balance - Comments: Reliant on at least 1 UE support for sitting balance and noted pt trying to keep LLE up off floor   Standing balance support: Bilateral upper extremity supported;Reliant on assistive device for balance;During functional  activity Standing balance-Leahy Scale: Poor Standing balance comment: Heavy reliance  on RW for support                           ADL either performed or assessed with clinical judgement   ADL Overall ADL's : Needs assistance/impaired Eating/Feeding: Independent;Sitting   Grooming: Set up;Standing;Wash/dry hands;Wash/dry face   Upper Body Bathing: Set up;Sitting   Lower Body Bathing: Moderate assistance;Sitting/lateral leans   Upper Body Dressing : Set up;Sitting   Lower Body Dressing: Total assistance;Bed level Lower Body Dressing Details (indicate cue type and reason): Total Assist to doff socks while in recliner due to LE edema, with pt very sensitive to touch so further LE dressing NT. Toilet Transfer: Stand-pivot;BSC/3in1;Cueing for sequencing;Cueing for safety;Minimal assistance;Moderate assistance;+2 for physical assistance Toilet Transfer Details (indicate cue type and reason): Pt stood from EOB to RW with Min As of 2 x 2 reps as pt sat back to EOB intitially due to LT LE pain: Pt had been shown how to use RW, UEs and RLE to compensate for LLE pain and keeping LLE of floor if pt required this for pain management. Pt was unable to demonstrate back intitially, became anxious and lowered to EOB.  Pt stood a second time with Min +2, this able to "hop"on RLE with RW to pivot to recliner, but stated that he had to immediately sit and was too far from recliner for safe dscent, and so required Moderate assist of two people to safely make it to chair. (BSC simulated to recliner). Toileting- Clothing Manipulation and Hygiene: Moderate assistance;Sitting/lateral lean;Sit to/from stand Toileting - Clothing Manipulation Details (indicate cue type and reason): Based on general assessment.     Functional mobility during ADLs: Moderate assistance;Minimal assistance;+2 for physical assistance;Rolling walker (2 wheels);Cueing for sequencing;Cueing for safety General ADL Comments: Impulsive when LLE increases in pain.     Vision   Vision Assessment?: No apparent visual  deficits     Perception Perception Perception: Within Functional Limits   Praxis Praxis Praxis: Intact    Pertinent Vitals/Pain Pain Assessment: 0-10 Pain Score: 8  Breathing: normal Negative Vocalization: none Facial Expression: smiling or inexpressive Body Language: relaxed Consolability: distracted or reassured by voice/touch PAINAD Score: 1 Pain Location: LE's L worse than R Pain Descriptors / Indicators: Grimacing;Guarding;Moaning;Numbness Pain Intervention(s): Limited activity within patient's tolerance;Monitored during session;Repositioned     Hand Dominance     Extremity/Trunk Assessment Upper Extremity Assessment Upper Extremity Assessment: Overall WFL for tasks assessed   Lower Extremity Assessment Lower Extremity Assessment: Defer to PT evaluation RLE Deficits / Details: mild edema noted with redness; tender to touch. Did not MMT due to pain. LLE Deficits / Details: Increased edema compared to RLE with redness present. Pt not willing to attempt active DF/PF with the L ankle due to pain. Unable to bear weight through the LLE due to pain. MMT limited due to pain.   Cervical / Trunk Assessment Cervical / Trunk Assessment: Normal   Communication Communication Communication: No difficulties   Cognition Arousal/Alertness: Awake/alert Behavior During Therapy: Flat affect;Anxious Overall Cognitive Status: Within Functional Limits for tasks assessed                                 General Comments: Oriented, and able to talk through his hospital stay. Wondering why therapy hasn't seen him for the past 10 days. 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 ADLs Comments: Reprots independence prior to symptom onset.        OT Problem List: Decreased strength;Pain;Impaired sensation;Increased edema;Decreased activity tolerance;Decreased safety awareness;Impaired balance (sitting and/or standing);Decreased knowledge of use of DME or AE      OT Treatment/Interventions: Self-care/ADL training;Therapeutic activities;Cognitive remediation/compensation;Energy conservation;Patient/family education;DME and/or AE instruction;Balance training;Manual therapy;Therapeutic exercise    OT Goals(Current goals can be found in the care plan section) Acute Rehab OT Goals Patient Stated Goal: Pain management and a Gatorade. OT Goal Formulation: With patient Time For Goal Achievement: 01/30/21 Potential to Achieve Goals: Good ADL Goals Pt Will Perform Grooming: with modified independence;standing (tolerating at least one task.) Pt Will Perform Lower Body Bathing: with modified independence;with adaptive equipment;sitting/lateral leans;sit to/from stand Pt Will Perform Lower Body Dressing: with modified independence;with adaptive equipment;sitting/lateral leans;sit to/from stand Pt Will Transfer to Toilet: with modified independence;ambulating;grab bars Pt Will Perform Toileting - Clothing Manipulation and hygiene: with modified independence;sitting/lateral leans;sit to/from stand Additional ADL Goal #1: Pt will demonstrate  decreased pain to LEs by tolerating light touch as needed for LE ADLs in order to increase independence with and tolerance for self care.  OT Frequency: Min 2X/week   Barriers to D/C:    Chart stated pt in group home, however pt reports in an apartment with a rooommate. Unsure of PLOF and pt currently requiring assitance of 2 people       Co-evaluation PT/OT/SLP Co-Evaluation/Treatment: Yes Reason for Co-Treatment: Complexity of the patient's impairments (multi-system involvement);To address functional/ADL transfers;For patient/therapist safety PT goals addressed during session: Mobility/safety with mobility OT goals addressed during session: ADL's and self-care      AM-PAC OT "6 Clicks" Daily Activity     Outcome Measure Help from another person eating meals?: None Help from another person taking care of personal grooming?: A Little Help from another person toileting, which includes using toliet, bedpan, or urinal?: A Lot Help from another person bathing (including washing, rinsing, drying)?: A Lot Help from another person to put on and taking off regular upper body clothing?: A Little Help from another person to put on and taking off regular lower body clothing?: Total 6 Click Score: 15   End of Session Equipment Utilized During Treatment: Gait belt;Rolling walker (2 wheels) Nurse Communication: Mobility status  Activity Tolerance: Patient limited by pain Patient left: in chair;with call bell/phone within reach;with chair alarm set;with nursing/sitter in room  OT Visit Diagnosis: Unsteadiness on feet (R26.81);Pain;Muscle weakness (generalized) (M62.81);Dizziness and giddiness (R42) Pain - Right/Left: Left Pain - part of body: Leg;Ankle and joints of foot  Time: [id number] OT Time Calculation (min): 27 min Charges:  OT General Charges $OT Visit: 1 Visit OT Evaluation $OT Eval Moderate Complexity: 1 Mod  Aubriee Szeto, OT Acute Rehab Services Office:  [phone] 01/16/2021  Theodoro Clock 01/16/2021, 10:29 AM

## 2021-01-16 NOTE — Evaluation (Signed)
Physical Therapy Evaluation Patient Details Name: Steve [name] MRN: [id number] DOB: 05-16-1965 Today's Date: 01/16/2021  History of Present Illness  Pt is a 55 y/o male who presents from his group home (drug/alcohol rehab?) s/p witnessed seizure and AMS. He was found to have AKI and rhabdomyolysis. No PMH noted in the chart.   Clinical Impression  Pt admitted with above diagnosis. Pt currently with functional limitations due to the deficits listed below (see PT Problem List). PTA pt reports he was independent. At the time of PT eval pt was significantly limited by LE pain and edema (L worse than R). Pt reports he had walked to the bathroom this morning with the IV pole only for support, however during session was not able to put weight through the LLE without significant pain and need for urgent return to sitting. Based on performance today, feel pt will require post-acute PT to prepare him for return to his group home. Acutely, pt will benefit from skilled PT to increase their independence and safety with mobility to allow discharge to the venue listed below.          Recommendations for follow up therapy are one component of a multi-disciplinary discharge planning process, led by the attending physician.  Recommendations may be updated based on patient status, additional functional criteria and insurance authorization.  Follow Up Recommendations Skilled nursing-short term rehab (<3 hours/day)    Assistance Recommended at Discharge Intermittent Supervision/Assistance  Functional Status Assessment Patient has had a recent decline in their functional status and demonstrates the ability to make significant improvements in function in a reasonable and predictable amount of time.  Equipment Recommendations  Rolling walker (2 wheels);BSC/3in1    Recommendations for Other Services       Precautions / Restrictions Precautions Precautions: Fall Precaution Comments: Appears to be hypersensitive to  touch on LE's Restrictions Weight Bearing Restrictions: No      Mobility  Bed Mobility Overal bed mobility: Needs Assistance Bed Mobility: Supine to Sit     Supine to sit: Supervision     General bed mobility comments: HOB ~20 elevated. Pt was able to transition to EOB without difficulty however required increased time.    Transfers Overall transfer level: Needs assistance Equipment used: Rolling walker (2 wheels) Transfers: Sit to/from Stand;Bed to chair/wheelchair/BSC Sit to Stand: Min assist;+2 physical assistance;From elevated surface Stand pivot transfers: Min assist;+2 physical assistance;From elevated surface         General transfer comment: +2 to power up to full stand and gain/maintain standing balance. Pt initially unable to maintain standing >10 seconds due to pain in the LLE and urgently sat back down. On second attempt to get to chair, pt was able to hold LLE up in the air for most of the pivot, however again with urgent sit and +2 assist to control lower to the chair due to pain.    Ambulation/Gait               General Gait Details: Unable to progress to gait training this date.  Stairs            Wheelchair Mobility    Modified Rankin (Stroke Patients Only)       Balance Overall balance assessment: Needs assistance Sitting-balance support: Feet supported;No upper extremity supported Sitting balance-Leahy Scale: Poor Sitting balance - Comments: Reliant on at least 1 UE support for sitting balance and noted pt trying to keep LLE up off floor   Standing balance support: Bilateral upper extremity supported;Reliant  on assistive device for balance;During functional activity Standing balance-Leahy Scale: Poor Standing balance comment: Heavy reliance on RW for support                             Pertinent Vitals/Pain Pain Assessment: 0-10 Pain Score: 8  Pain Location: LE's L worse than R Pain Descriptors / Indicators:  Grimacing;Guarding;Moaning;Numbness Pain Intervention(s): Limited activity within patient's tolerance;Monitored during session;Repositioned    Home Living Family/patient expects to be discharged to:: Group home Living Arrangements: Group Home Available Help at Discharge: Available 24 hours/day;Other (Comment) (Group home/drug rehab) Type of Home: Apartment Home Access: Level entry       Home Layout: One level Home Equipment: None      Prior Function Prior Level of Function : Independent/Modified Independent;Driving;Working/employed             Mobility Comments: 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       Hand Dominance        Extremity/Trunk Assessment   Upper Extremity Assessment Upper Extremity Assessment: Overall WFL for tasks assessed    Lower Extremity Assessment Lower Extremity Assessment: RLE deficits/detail;LLE deficits/detail RLE Deficits / Details: mild edema noted with redness; tender to touch. Did not MMT due to pain. LLE Deficits / Details: Increased edema compared to RLE with redness present. Pt not willing to attempt active DF/PF with the L ankle due to pain. Unable to bear weight through the LLE due to pain. MMT limited due to pain.    Cervical / Trunk Assessment Cervical / Trunk Assessment: Normal (Forward head posture with rounded shoulders)  Communication   Communication: No difficulties  Cognition Arousal/Alertness: Awake/alert Behavior During Therapy: Flat affect;Anxious Overall Cognitive Status: Within Functional Limits for tasks assessed                                 General Comments: Oriented, and able to talk through his hospital stay. Wondering why therapy hasn't seen him for the past 10 days. Overall appears [facility] however states he builds hospitals and does not mention his  group home.        General Comments      Exercises     Assessment/Plan    PT Assessment Patient needs continued PT services  PT Problem List Decreased strength;Decreased activity tolerance;Decreased balance;Decreased mobility;Decreased knowledge of use of DME;Decreased safety awareness;Decreased knowledge of precautions;Pain       PT Treatment Interventions DME instruction;Gait training;Functional mobility training;Therapeutic activities;Therapeutic exercise;Neuromuscular re-education;Patient/family education;Wheelchair mobility training    PT Goals (Current goals can be found in the Care Plan section)  Acute Rehab PT Goals Patient Stated Goal: Be able to go home PT Goal Formulation: With patient Time For Goal Achievement: 01/30/21 Potential to Achieve Goals: Good    Frequency Min 3X/week   Barriers to discharge        Co-evaluation PT/OT/SLP Co-Evaluation/Treatment: Yes Reason for Co-Treatment: Complexity of the patient's impairments (multi-system involvement);For patient/therapist safety;To address functional/ADL transfers PT goals addressed during session: Mobility/safety with mobility;Balance;Proper use of DME;Strengthening/ROM         AM-PAC PT "6 Clicks" Mobility  Outcome Measure Help needed turning from your back to your side while in a flat bed without using bedrails?: None Help needed moving from lying on your  back to sitting on the side of a flat bed without using bedrails?: A Little Help needed moving to and from a bed to a chair (including a wheelchair)?: A Lot Help needed standing up from a chair using your arms (e.g., wheelchair or bedside chair)?: A Lot Help needed to walk in hospital room?: Total Help needed climbing 3-5 steps with a railing? : Total 6 Click Score: 13    End of Session Equipment Utilized During Treatment: Gait belt Activity Tolerance: Patient limited by pain Patient left: in chair;with call bell/phone within reach;with chair alarm  set Nurse Communication: Mobility status PT Visit Diagnosis: Unsteadiness on feet (R26.81);Pain    Time: 7619-5093 PT Time Calculation (min) (ACUTE ONLY): 33 min   Charges:   PT Evaluation $PT Eval Moderate Complexity: 1 Mod          Conni Slipper, PT, DPT Acute Rehabilitation Services Pager: [phone] Office: [phone]   Marylynn Pearson 01/16/2021, 10:13 AM

## 2021-01-16 NOTE — Progress Notes (Signed)
ANTICOAGULATION CONSULT NOTE  Pharmacy Consult for Heparin and Coumadin Indication:  Clot at HD cath site Brief A/P: Heparin level and INR subtherapeutic Increase Heparin rate, Coumadin 5 mg today  Not on File  Patient Measurements: Height: 5\' 8"  (172.7 cm) Weight: 78.6 kg (173 lb 4.5 oz) IBW/kg (Calculated) : 68.4 Heparin Dosing Weight: 81.5 kg  Vital Signs: Temp: 98.8 F (37.1 C) (12/31 0426) Temp Source: Oral (12/31 0426) BP: 150/90 (12/31 0807) Pulse Rate: 79 (12/31 0807)  Labs: Recent Labs    01/14/21 0207 01/14/21 1125 01/15/21 0100 01/15/21 0808 01/16/21 0141 01/16/21 1105  HGB 11.3*  --  10.6*  --  10.0*  --   HCT 31.2*  --  30.0*  --  30.2*  --   PLT 191  --  203  --  252  --   LABPROT  --   --   --   --  13.5  --   INR  --   --   --   --  1.0  --   HEPARINUNFRC  --    < >  --  0.31 0.24* 0.35  CREATININE 6.31*  --  5.38*  --  4.35*  --   CKTOTAL  --   --  2,328*  --   --   --    < > = values in this interval not displayed.     Estimated Creatinine Clearance: 18.6 mL/min (A) (by C-G formula based on SCr of 4.35 mg/dL (H)).  Assessment: 55 years of age male admitted for sepsis, altered mental status, and AKI. Patient had HD catheter in place for possible need for HD but this was removed today due to ongoing fever. At time of removal and clot was noted and pharmacy was consulted to start IV Heparin.   8 hour confirmatory heparin level is therapeutic at 0.35.   Goal of Therapy:  INR 2-3 Heparin level 0.3-0.7 units/ml Monitor platelets by anticoagulation protocol: Yes   Plan:  Continue Heparin 1850 units/hr Coumadin 5 mg today Monitor CBC, heparin level and INR daily   53, PharmD PGY2 Infectious Diseases Pharmacy Resident   Please check [email or web address] for unit-specific pharmacy phone numbers

## 2021-01-16 NOTE — Progress Notes (Signed)
Upper extremity venous has been completed.   Preliminary results in CV Proc.   Aundra Millet Minervia Osso 01/16/2021 11:19 AM

## 2021-01-16 NOTE — Progress Notes (Addendum)
[facility] Progress Note  Name: Steve [name] MRN: [id number] DOB: 04-23-1965    Subjective:  Patient feeling okay today.  Having some anxiety.  Worried about his lower extremity edema.  We discussed this will take some time to improve.  ---------------------- Background on consult:  Steve [name] is a 55 y.o. male  with limited history who presented to the ER after EMS was called to his group home.  EMS reported that his roommate found him having full body convulsions and then he had altered mental status and was combative.  Also per chart review EMS indicates that he roommate had reported atypical behavior a few days prior as well.  Though per charting he was found to have, it is not sure how long he was down.  He was found to have AKI which is progressively worsening and has also had a fever and was found to have rhabdomyolysis.  He had an LP which had gram stain with GPC in clusters per ID consult - they are concerned may have been a contaminant.  He was initiated on antibiotics but infectious disease has stopped empiric antibiotics as of 12/22 note.  Earlier in his course he received vanc, cefepime, unasyn, and ceftriaxone since his admission.  They were concerned that he may have a viral process with rhabdo and seizure.  Renal US on 12/21 with no hydro and echogenicity within normal limits.  He is covid negative.  He has been on LR at 250 ml/hr.  Yesterday foley was inserted and he had immediate return of urine with 500 ml over a 12 hour period per nursing-  has been nonoliguric.          Intake/Output Summary (Last 24 hours) at 01/16/2021 0948 Last data filed at 01/16/2021 0400 Gross per 24 hour  Intake 695.52 ml  Output 1650 ml  Net -954.48 ml    Vitals:  Vitals:   01/16/21 0024 01/16/21 0426 01/16/21 0500 01/16/21 0807  BP: 138/89 (!) 136/92  (!) 150/90  Pulse: 83 89  79  Resp: 18 17  18   Temp: 98.7 F (37.1 C) 98.8 F (37.1 C)    TempSrc: Oral Oral     SpO2: 97% 94%  99%  Weight:   78.6 kg   Height:         Physical Exam:  General lying in bed, some anxiety but no significant distress HEENT normocephalic atraumatic, sclera anicteric Neck supple trachea midline Lungs bilateral chest rise with no increased work of breathing Heart normal rate Abdomen soft nontender nondistended Extremities 1+ pitting edema in the bilateral lower extremities, overall improved Psych anxious, affect normal Neuro -oriented to person and place GU foley catheter in place  Medications reviewed   Labs:  BMP Latest Ref Rng & Units 01/16/2021 01/15/2021 01/14/2021  Glucose 70 - 99 mg/dL 01/16/2021) 086(V) 784(O)  BUN 6 - 20 mg/dL 962(X) 52(W) 41(L)  Creatinine 0.61 - 1.24 mg/dL 24(M) 0.10(U) 7.25(D)  Sodium 135 - 145 mmol/L 143 140 137  Potassium 3.5 - 5.1 mmol/L 3.4(L) 3.2(L) 3.2(L)  Chloride 98 - 111 mmol/L 104 100 93(L)  CO2 22 - 32 mmol/L 28 29 29   Calcium 8.9 - 10.3 mg/dL 7.6(L) 6.9(L) 6.9(L)     Assessment/Plan:   # AKI  - Secondary to rhabdomyolysis.  HD catheter was placed but never performed hemodialysis.  Creatinine continues to improve in a steady pattern.  Hopefully will return to baseline.  Given his significant improvement we will sign off at this  time.  Please do not hesitate to call us if further assistance is needed -Edema will likely improve with time.  Can use diuretics if needed intermittently    # Rhabdomyolysis  - s/p fluid resuscitation; transitioned to bicarb gtt -off IV fluids given he is volume overloaded.  Overall improved   # Metabolic acidosis  - setting of AKI; lactic acidosis---  resolved    # Seizure  - per neurology and critical care     # Encephalopathy  - setting of seizure and aki and initially covered with abx as concern for infection - appreciate primary team and neuro  -Has improved    # Hypocalcemia - improves with correction for albumin  - s/p calcium gluconate  Hypokalemia/hypomagnesemia:  Repletion as needed    Darnell Level, MD 01/16/2021 9:48 AM

## 2021-01-16 NOTE — Progress Notes (Signed)
ANTICOAGULATION CONSULT NOTE  Pharmacy Consult for Heparin and Coumadin Indication:  Clot at HD cath site Brief A/P: Heparin level and INR subtherapeutic Increase Heparin rate, Coumadin 5 mg today  Not on File  Patient Measurements: Height: 5\' 8"  (172.7 cm) Weight: 81.5 kg (179 lb 10.8 oz) IBW/kg (Calculated) : 68.4 Heparin Dosing Weight: 81.5 kg  Vital Signs: Temp: 98.7 F (37.1 C) (12/31 0024) Temp Source: Oral (12/31 0024) BP: 138/89 (12/31 0024) Pulse Rate: 83 (12/31 0024)  Labs: Recent Labs    01/14/21 0207 01/14/21 1125 01/14/21 2017 01/15/21 0100 01/15/21 0808 01/16/21 0141  HGB 11.3*  --   --  10.6*  --  10.0*  HCT 31.2*  --   --  30.0*  --  30.2*  PLT 191  --   --  203  --  252  LABPROT  --   --   --   --   --  13.5  INR  --   --   --   --   --  1.0  HEPARINUNFRC  --    < > 0.34  --  0.31 0.24*  CREATININE 6.31*  --   --  5.38*  --   --   CKTOTAL  --   --   --  2,328*  --   --    < > = values in this interval not displayed.     Estimated Creatinine Clearance: 15 mL/min (A) (by C-G formula based on SCr of 5.38 mg/dL (H)).  Assessment: 55 y.o. male with HD catheter thrombus for anticoagulation.  Heparin level subtherapeutic today  Goal of Therapy:  INR 2-3 Heparin level 0.3-0.7 units/ml Monitor platelets by anticoagulation protocol: Yes   Plan:  Increase Heparin 1850 units/hr Coumadin 5 mg today  53, PharmD, BCPS  01/16/2021 2:46 AM

## 2021-01-16 NOTE — Progress Notes (Signed)
PROGRESS NOTE  Steve [name]  DOB: 05/25/1965  PCP: No primary care provider on file. KZS:[id number]  DOA: 01/05/2021  LOS: 11 days  Hospital Day: 12  Chief Complaint  Patient presents with   Altered Mental Status   Seizures    Brief narrative: Steve [name] is a 55 y.o. male with history of major depression, anxiety who was in a group home for last 5 months. He was brought to the ED on 12/20 from a group home for convalescence, combativeness and altered sensorium.  Per collateral history, patient was not acting typical for last few days. In the ED, patient was confused, combative Labs showed AKI with creatinine of 3.27, rhabdomyolysis, abnormal LFTs, elevated WBC count at 23.7 and lactic acid level at 7.6. 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  IV fluid resuscitation started Patient was admitted to ICU. 12/21, underwent LP which showed staph in CSF, felt to be contaminant.  EEG did not show any seizures 12/24, HD catheter was placed but patient did not require HD as creatinine started to improve 12/31, with clinical improvement, patient was transferred out of ICU to [facility].  Subjective: Patient was seen and examined this morning. Pleasant middle-aged Caucasian male.  Not in distress presenting symptoms Chart reviewed.  No fever last 24 hours, blood pressures are stable, breathing on room air Labs from this morning with potassium low at 3.4, creatinine 4.35, hemoglobin 10.  Assessment/Plan: Acute renal failure secondary to rhabdomyolysis Acute metabolic acidosis -His creatinine continue to worsen to peak at 7.48 on 12/26.  He was prepped for dialysis, had a HD catheter placed but never needed dialysis. Creatinine is now steadily improving.  Serum mag level improving as well. -Nephrology consult  appreciated -Monitor labs. Recent Labs    01/07/21 0247 01/08/21 0056 01/09/21 0625 01/10/21 0741 01/11/21 0217 01/12/21 0639 01/13/21 0241 01/14/21 0207 01/15/21 0100 01/16/21 0141  BUN 79* 85* 91* 89* 84* 76* 70* 62* 54* 39*  CREATININE 4.86* 6.17* 7.26* 7.61* 7.48* 7.20* 7.03* 6.31* 5.38* 4.35*  CO2 15* 13* 17* 24 24 31 31 29 29 28    Rhabdomyolysis -Primary cause of AKI.  CK level improving with IV fluid.  Continue to monitor. Recent Labs  Lab 01/12/21 0953 01/15/21 0100  CKTOTAL 5,035* 2,328*    Uremic encephalopathy -Was combative on admission.  Mental status improving with improvement in uremia. -Patient states he has history of major depression and anxiety.  Home med list shows he was on Prozac 40 mg daily, Neurontin 400 mg 3 times daily, Atarax 50 mg every 4 hours for anxiety and trazodone 50 mg at bedtime as needed -Currently he is on Seroquel 100 mg twice daily, BuSpar 10 mg twice daily, trazodone 100 mg nightly -I will also resume Prozac 40 mg daily today.  Seizure -Generalized convulsion was noted by his roommate prior to presentation. -EEG unremarkable.  Neurology consult appreciated.  Currently on Vimpat 100 mg twice daily given renal failure and possibility of Keppra contributing to agitation.  ?? Upper extremity DVT -Per critical care note, hemodialysis cath had a clot on it when it was removed.  Ultrasound duplex scan of upper extremities was obtained today which did not show any evidence of DVT.  Because of his risk of falls, noncompliance, recurrent seizures, I would avoid long-term anticoagulation without definite evidence of DVT.  Aspiration pneumonia -Completed Zosyn -  Isolated fever 12/27-has been stable, no associated leukocytosis -No recurrence of the fever  Hypokalemia/hypomagnesemia -Potassium replaced today.  Magnesium was low yesterday.  29 replacement given.  On replacement today. Recent Labs  Lab 01/12/21 0639 01/13/21 0241 01/14/21 0207  01/15/21 0100 01/15/21 0808 01/16/21 0141  K 3.5 3.5 3.2* 3.2*  --  3.4*  MG  --   --   --   --  1.5*  --   PHOS 5.7* 4.8* 5.4* 4.5  --  3.5   Acute liver injury -Continue to trend LFTs -Levels improving.  Repeat tomorrow Recent Labs  Lab 01/12/21 [phone] 01/13/21 0241 01/14/21 0207 01/15/21 0100 01/16/21 0141  AST 172*  --   --  91*  --   ALT 241*  --   --  137*  --   ALKPHOS 49  --   --  48  --   BILITOT 0.8  --   --  0.6  --   PROT 5.5*  --   --  5.4*  --   ALBUMIN 2.3* 2.3* 2.2* 2.3*   2.2* 2.6*   Evidence of right ventricular dysfunction and recent echocardiogram -Possible ASD -Consider repeat transthoracic echo when more stable from encephalopathy  Mobility: Encourage ambulation Living condition: Group home Goals of care:   Code Status: Full Code  Nutritional status: Body mass index is 26.35 kg/m.      Diet:  Diet Order             Diet renal with fluid restriction Fluid restriction: Other (see comments); Room service appropriate? Yes; Fluid consistency: Thin  Diet effective now                  DVT prophylaxis:  heparin injection 5,000 Units Start: 01/16/21 1545   Antimicrobials: None Fluid: None Consultants: Nephrology Family Communication: None at bedside  Status is: Inpatient  Continue in-hospital care because: Improving renal function Level of care: Med-Surg   Dispo: The patient is from: Group home              Anticipated d/c is to: SNF              Patient currently is not medically stable to d/c.   Difficult to place patient No     Infusions:   sodium chloride Stopped (01/08/21 2354)   lacosamide (VIMPAT) IV 100 mg (01/16/21 1051)   magnesium sulfate bolus IVPB      Scheduled Meds:  aspirin  325 mg Oral Daily   Or   aspirin  300 mg Rectal Daily   busPIRone  10 mg Oral BID   Chlorhexidine Gluconate Cloth  6 each Topical Daily   docusate sodium  100 mg Oral BID   FLUoxetine  40 mg Oral Daily   heparin injection  (subcutaneous)  5,000 Units Subcutaneous Q8H   mouth rinse  15 mL Mouth Rinse BID   polyethylene glycol  17 g Oral Daily   QUEtiapine  100 mg Oral BID   sodium chloride flush  3 mL Intravenous Q12H   traZODone  100 mg Oral QHS    PRN meds: sodium chloride, albuterol, diphenhydrAMINE, docusate sodium, ondansetron (ZOFRAN) IV, oxyCODONE, polyethylene glycol, sodium chloride flush   Antimicrobials: Anti-infectives (From admission, onward)    Start     Dose/Rate Route Frequency Ordered Stop   01/07/21 1500  vancomycin (VANCOREADY) IVPB 750 mg/150 mL  Status:  Discontinued        750 mg 150 mL/hr over 60  Minutes Intravenous Every 24 hours 01/06/21 1326 01/07/21 0802   01/07/21 0802  vancomycin variable dose per unstable renal function (pharmacist dosing)  Status:  Discontinued         Does not apply See admin instructions 01/07/21 0802 01/07/21 1000   01/06/21 2200  cefTRIAXone (ROCEPHIN) 2 g in sodium chloride 0.9 % 100 mL IVPB  Status:  Discontinued        2 g 200 mL/hr over 30 Minutes Intravenous Every 12 hours 01/06/21 1635 01/07/21 1000   01/06/21 1600  ceFEPIme (MAXIPIME) 2 g in sodium chloride 0.9 % 100 mL IVPB  Status:  Discontinued        2 g 200 mL/hr over 30 Minutes Intravenous Every 24 hours 01/05/21 1612 01/05/21 1721   01/06/21 1415  vancomycin (VANCOREADY) IVPB 1500 mg/300 mL        1,500 mg 150 mL/hr over 120 Minutes Intravenous  Once 01/06/21 1326 01/06/21 1931   01/05/21 1845  Ampicillin-Sulbactam (UNASYN) 3 g in sodium chloride 0.9 % 100 mL IVPB  Status:  Discontinued        3 g 200 mL/hr over 30 Minutes Intravenous Every 12 hours 01/05/21 1754 01/06/21 1635   01/05/21 1645  vancomycin (VANCOCIN) IVPB 1000 mg/200 mL premix        1,000 mg 200 mL/hr over 60 Minutes Intravenous  Once 01/05/21 1513 01/05/21 1752   01/05/21 1612  vancomycin variable dose per unstable renal function (pharmacist dosing)  Status:  Discontinued         Does not apply See admin instructions  01/05/21 1612 01/05/21 1721   01/05/21 1515  ceFEPIme (MAXIPIME) 2 g in sodium chloride 0.9 % 100 mL IVPB        2 g 200 mL/hr over 30 Minutes Intravenous  Once 01/05/21 1513 01/05/21 1648       Objective: Vitals:   01/16/21 0426 01/16/21 0807  BP: (!) 136/92 (!) 150/90  Pulse: 89 79  Resp: 17 18  Temp: 98.8 F (37.1 C) (P) 98.7 F (37.1 C)  SpO2: 94% 99%    Intake/Output Summary (Last 24 hours) at 01/16/2021 1449 Last data filed at 01/16/2021 1200 Gross per 24 hour  Intake 1175.52 ml  Output 1750 ml  Net -574.48 ml   Filed Weights   01/12/21 0200 01/13/21 0500 01/16/21 0500  Weight: 82 kg 81.5 kg 78.6 kg   Weight change:  Body mass index is 26.35 kg/m.   Physical Exam: General exam: Pleasant, middle-aged Caucasian male.  Not in physical distress. Skin: No rashes, lesions or ulcers. HEENT: Atraumatic, normocephalic, no obvious bleeding Lungs: Clear to auscultation bilaterally CVS: Regular rate and rhythm, no murmur GI/Abd soft, nontender, nondistended, and bowel sound present  CNS: Alert, awake, oriented x3 Psychiatry: Mood appropriate Extremities: No pedal edema, no calf tenderness  Data Review: I have personally reviewed the laboratory data and studies available.  F/u labs ordered Unresulted Labs (From admission, onward)     Start     Ordered   01/17/21 0500  CBC with Differential/Platelet  Daily,   R     Question:  Specimen collection method  Answer:  Lab=Lab collect   01/16/21 1021   01/17/21 0500  Basic metabolic panel  Daily,   R     Question:  Specimen collection method  Answer:  Lab=Lab collect   01/16/21 1021   01/17/21 0500  Phosphorus  Tomorrow morning,   R       Question:  Specimen collection  method  Answer:  Lab=Lab collect   01/16/21 1021   01/17/21 0500  Magnesium  Tomorrow morning,   STAT       Question:  Specimen collection method  Answer:  Lab=Lab collect   01/16/21 1021   01/17/21 0500  CK  Daily,   R     Question:  Specimen  collection method  Answer:  Lab=Lab collect   01/16/21 1021   01/17/21 0500  Ammonia  Tomorrow morning,   R       Question:  Specimen collection method  Answer:  Lab=Lab collect   01/16/21 1021   01/16/21 0500  Heparin level (unfractionated)  Daily,   R     Question:  Specimen collection method  Answer:  Lab=Lab collect   01/15/21 0730   01/16/21 0500  Protime-INR  Daily,   R     Question:  Specimen collection method  Answer:  Lab=Lab collect   01/15/21 1057   Signed and Held  Hepatitis B surface antigen  (New Admission Hemo Labs (Hepatitis B))  Once,   R       Question:  Specimen collection method  Answer:  Lab=Lab collect   Signed and Held   Signed and Held  Hepatitis B surface antibody  (New Admission Hemo Labs (Hepatitis B))  Once,   R       Question:  Specimen collection method  Answer:  Lab=Lab collect   Signed and Held   Signed and Held  Hepatitis B surface antibody,quantitative  (New Admission Hemo Labs (Hepatitis B))  Once,   R       Question:  Specimen collection method  Answer:  Lab=Lab collect   Signed and Held            Signed, Lorin Glass, MD [facility] 01/16/2021

## 2021-01-17 ENCOUNTER — Inpatient Hospital Stay (HOSPITAL_COMMUNITY): Payer: Medicaid Other

## 2021-01-17 LAB — BASIC METABOLIC PANEL
Anion gap: 7 (ref 5–15)
BUN: 27 mg/dL — ABNORMAL HIGH (ref 6–20)
CO2: 27 mmol/L (ref 22–32)
Calcium: 7.8 mg/dL — ABNORMAL LOW (ref 8.9–10.3)
Chloride: 108 mmol/L (ref 98–111)
Creatinine, Ser: 2.98 mg/dL — ABNORMAL HIGH (ref 0.61–1.24)
GFR, Estimated: 24 mL/min — ABNORMAL LOW (ref >60)
Glucose, Bld: 98 mg/dL (ref 70–99)
Potassium: 3.5 mmol/L (ref 3.5–5.1)
Sodium: 142 mmol/L (ref 135–145)

## 2021-01-17 LAB — CBC WITH DIFFERENTIAL/PLATELET
Abs Immature Granulocytes: 0.03 10*3/uL (ref 0.00–0.07)
Basophils Absolute: 0 10*3/uL (ref 0.0–0.1)
Basophils Relative: 1 %
Eosinophils Absolute: 0.3 10*3/uL (ref 0.0–0.5)
Eosinophils Relative: 4 %
HCT: 27.8 % — ABNORMAL LOW (ref 39.0–52.0)
Hemoglobin: 9.5 g/dL — ABNORMAL LOW (ref 13.0–17.0)
Immature Granulocytes: 0 %
Lymphocytes Relative: 25 %
Lymphs Abs: 1.7 10*3/uL (ref 0.7–4.0)
MCH: 32.5 pg (ref 26.0–34.0)
MCHC: 34.2 g/dL (ref 30.0–36.0)
MCV: 95.2 fL (ref 80.0–100.0)
Monocytes Absolute: 0.9 10*3/uL (ref 0.1–1.0)
Monocytes Relative: 13 %
Neutro Abs: 3.9 10*3/uL (ref 1.7–7.7)
Neutrophils Relative %: 57 %
Platelets: 262 10*3/uL (ref 150–400)
RBC: 2.92 MIL/uL — ABNORMAL LOW (ref 4.22–5.81)
RDW: 14.6 % (ref 11.5–15.5)
WBC: 6.9 10*3/uL (ref 4.0–10.5)
nRBC: 0 % (ref 0.0–0.2)

## 2021-01-17 LAB — CK: Total CK: 1023 U/L — ABNORMAL HIGH (ref 49–397)

## 2021-01-17 LAB — PHOSPHORUS: Phosphorus: 3.6 mg/dL (ref 2.5–4.6)

## 2021-01-17 LAB — AMMONIA: Ammonia: 28 umol/L (ref 9–35)

## 2021-01-17 LAB — HEPARIN LEVEL (UNFRACTIONATED): Heparin Unfractionated: <0.1 IU/mL — ABNORMAL LOW (ref 0.30–0.70)

## 2021-01-17 LAB — MAGNESIUM: Magnesium: 2.3 mg/dL (ref 1.7–2.4)

## 2021-01-17 IMAGING — CR DG ANKLE COMPLETE 3+V*L*
3 series · 3 of 3 positions shown · non-contrast
Comparison: None.

CLINICAL DATA: Ankle pain.

EXAM:
LEFT ANKLE COMPLETE - 3+ VIEW

[ankle ap]
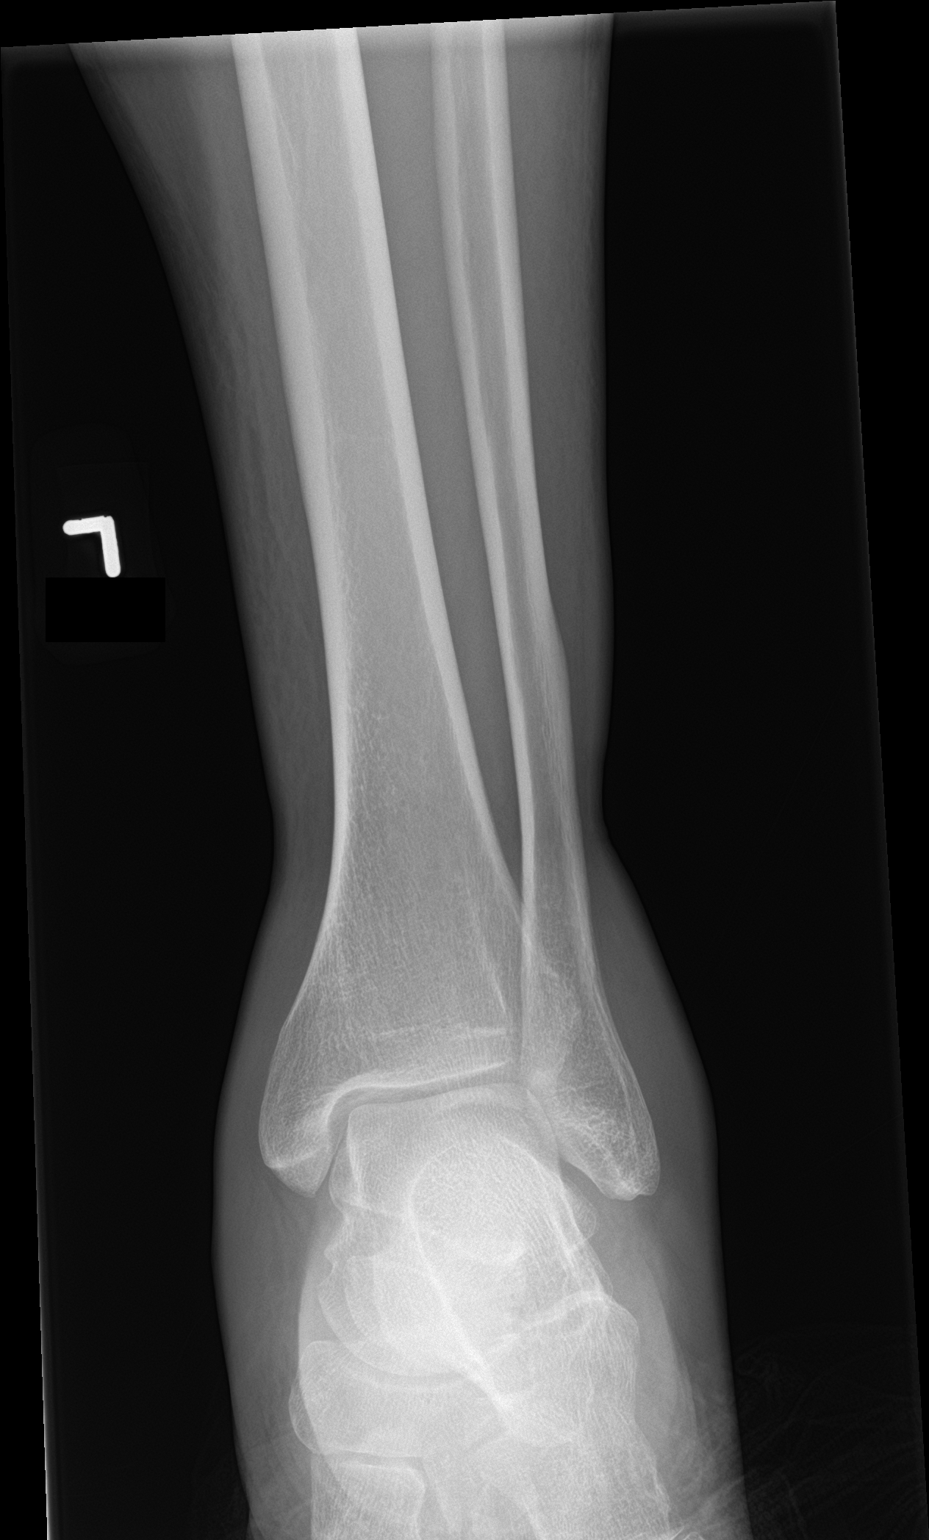

[ankle obl]
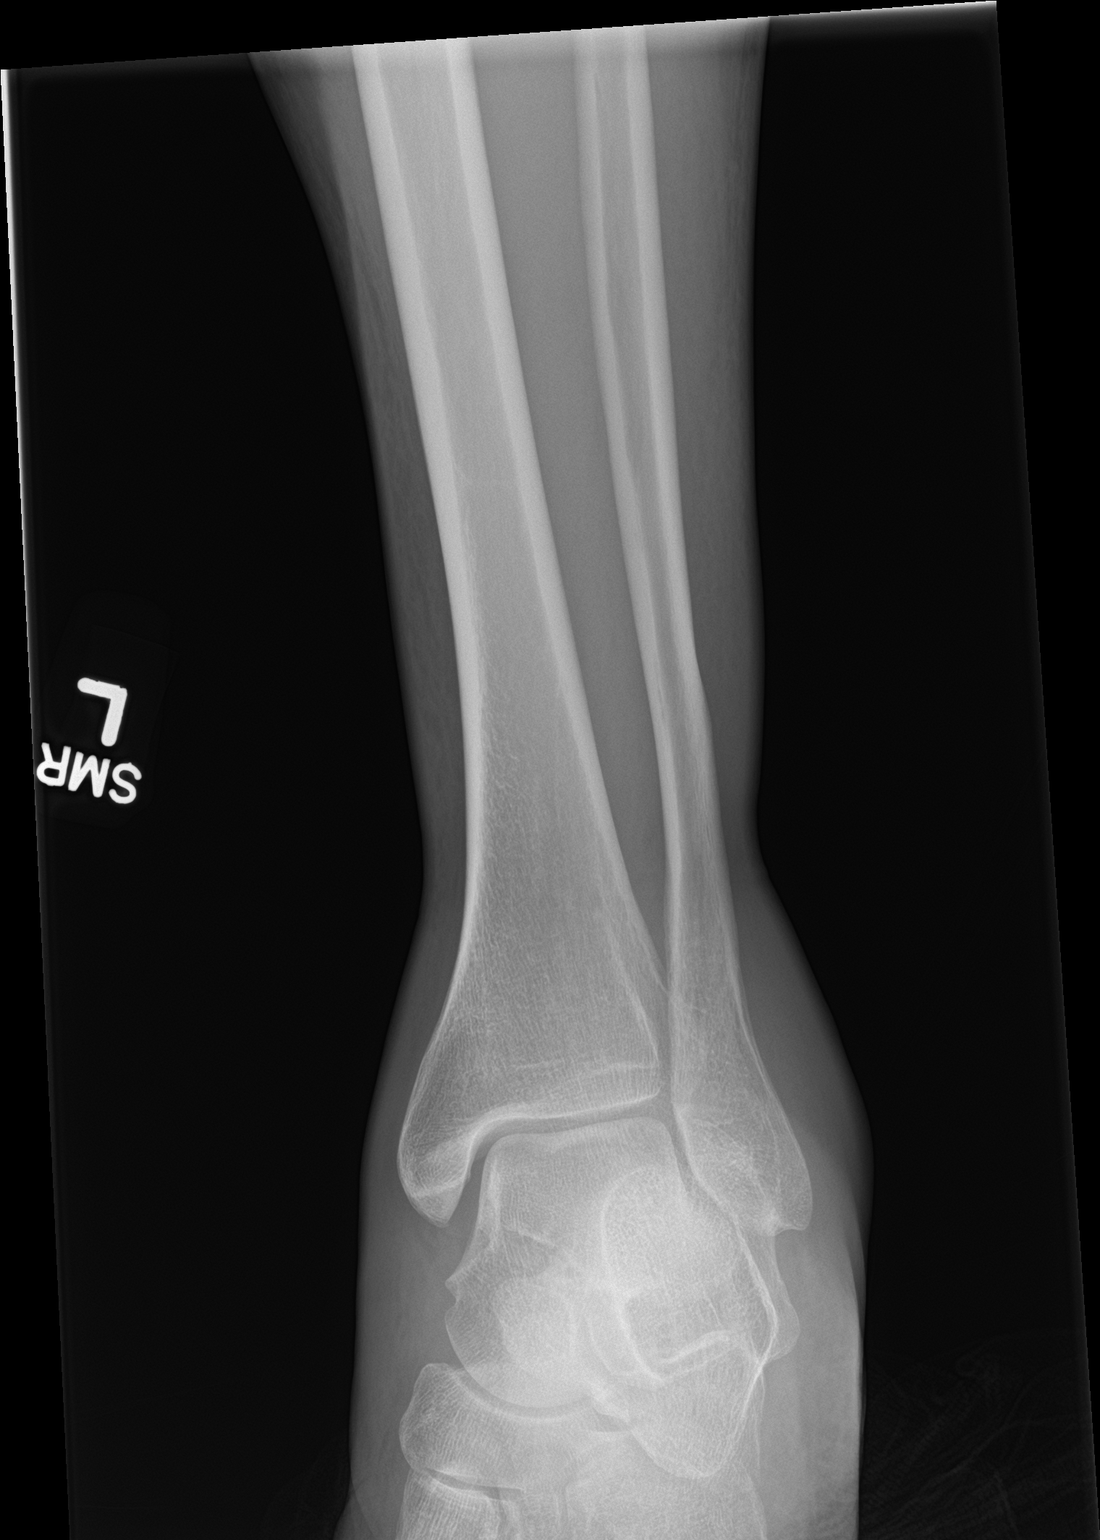

[ankle lat]
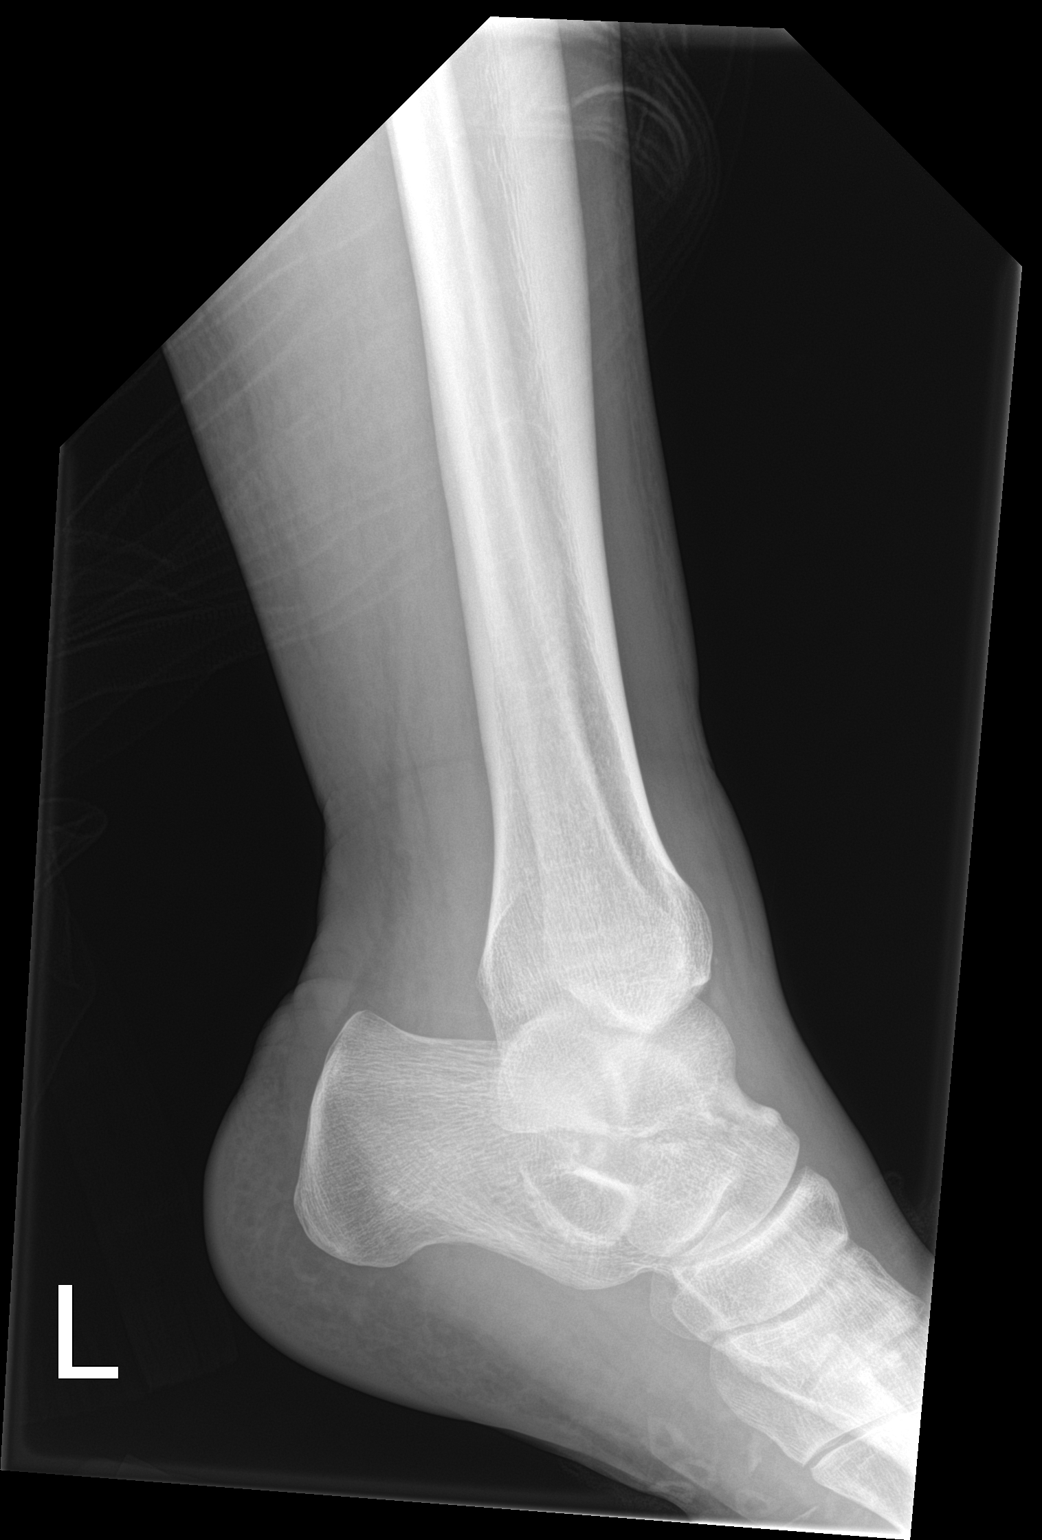

[3 of 3 positions shown; findings below may reference images not displayed]

FINDINGS: There is no evidence of fracture, dislocation, or joint effusion.
There is no evidence of arthropathy or other focal bone abnormality.
There is soft tissue swelling surrounding the ankle.
IMPRESSION: 1. No acute bony abnormality.
2. Soft tissue swelling surrounding the ankle.

## 2021-01-17 MED ORDER — SODIUM CHLORIDE 0.9 % IV SOLN: INTRAVENOUS | Status: DC

## 2021-01-17 MED ORDER — NICOTINE 21 MG/24HR TD PT24
21.0000 mg | MEDICATED_PATCH | Freq: Every day | TRANSDERMAL | Status: DC
  Administered 2021-01-17 – 2021-05-14 (×104): 21 mg via TRANSDERMAL
  Filled 2021-01-17 (×117): qty 1

## 2021-01-17 NOTE — Progress Notes (Signed)
PROGRESS NOTE  Steve [name]  DOB: December 01, 1965  PCP: No primary care provider on file. ELF:[id number]  DOA: 01/05/2021  LOS: 12 days  Hospital Day: 13  Chief Complaint  Patient presents with   Altered Mental Status   Seizures    Brief narrative: Steve [name] is a 56 y.o. male with history of major depression, anxiety who was in a group home for last 5 months. He was brought to the ED on 12/20 from a group home for convalescence, combativeness and altered sensorium.  Per collateral history, patient was not acting typical for last few days. In the ED, patient was confused, combative Labs showed AKI with creatinine of 3.27, rhabdomyolysis, abnormal LFTs, elevated WBC count at 23.7 and lactic acid level at 7.6. 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  IV fluid resuscitation started Patient was admitted to ICU. 12/21, underwent LP which showed staph in CSF, felt to be contaminant.  EEG did not show any seizures 12/24, HD catheter was placed but patient did not require HD as creatinine started to improve 12/31, with clinical improvement, patient was transferred out of ICU to [facility].  Subjective: Patient was seen and examined this morning.  Lying down in bed.  Not in distress urinary symptoms.  Patient was noted by nursing staff this morning to have swollen left ankle.  Patient is not aware of any trauma.  On exam, he has swollen tender left ankle with decreased active and passive ROM.  Assessment/Plan: Acute renal failure secondary to rhabdomyolysis Acute metabolic acidosis -His creatinine continue to worsen to peak at 7.48 on 12/26.  He was prepped for dialysis, had a HD catheter placed but never needed dialysis. Creatinine is now steadily improving, down to 2.98 today.  Serum mag level improving as well. -Nephrology  consult appreciated -I will keep him on normal saline at 75 mill per hour.  Encourage oral intake as well. Recent Labs    01/08/21 0056 01/09/21 0625 01/10/21 0741 01/11/21 0217 01/12/21 0639 01/13/21 0241 01/14/21 0207 01/15/21 0100 01/16/21 0141 01/17/21 0141  BUN 85* 91* 89* 84* 76* 70* 62* 54* 39* 27*  CREATININE 6.17* 7.26* 7.61* 7.48* 7.20* 7.03* 6.31* 5.38* 4.35* 2.98*  CO2 13* 17* 24 24 31 31 29 29 28 27    Rhabdomyolysis -Primary cause of AKI.  CK level improving with IV fluid.  Continue to monitor. Recent Labs  Lab 01/12/21 0953 01/15/21 0100 01/17/21 0141  CKTOTAL 5,035* 2,328* 1,023*    Uremic encephalopathy -Was combative on admission.  Mental status improving with improvement in uremia. -Patient states he has history of major depression and anxiety.  Home med list shows he was on Prozac 40 mg daily, Neurontin 400 mg 3 times daily, Atarax 50 mg every 4 hours for anxiety and trazodone 50 mg at bedtime as needed -Currently he is on Prozac 40 mg daily, Seroquel 100 mg twice daily, BuSpar 10 mg twice daily, trazodone 100 mg nightly.    Seizure -Generalized convulsion was noted by his roommate prior to presentation. -EEG unremarkable.  Neurology consult appreciated.  Currently on Vimpat 100 mg twice daily given renal failure and possibility of Keppra contributing to agitation.  ?? Upper extremity DVT -Per critical care note, hemodialysis cath had a clot on it when it was removed.  Ultrasound duplex scan of upper extremities was obtained on 12/31 which did not  show any evidence of DVT.  Because of his risk of falls, noncompliance, recurrent seizures, I would avoid long-term anticoagulation without definite evidence of DVT.  Left ankle swelling -Today, patient was noticed to have left ankle swelling, pain, tenderness and decreased range of movement. Will obtain x-rays.  Aspiration pneumonia -Completed Zosyn -Isolated fever 12/27-has been stable, no associated  leukocytosis -No recurrence of the fever  Hypokalemia/hypomagnesemia -Potassium and magnesium replacement given Recent Labs  Lab 01/13/21 0241 01/14/21 0207 01/15/21 0100 01/15/21 0808 01/16/21 0141 01/17/21 0141  K 3.5 3.2* 3.2*  --  3.4* 3.5  MG  --   --   --  1.5*  --  2.3  PHOS 4.8* 5.4* 4.5  --  3.5 3.6   Acute liver injury -Continue to trend LFTs -Levels improving.  Repeat tomorrow Recent Labs  Lab 01/12/21 [phone] 01/13/21 0241 01/14/21 0207 01/15/21 0100 01/16/21 0141  AST 172*  --   --  91*  --   ALT 241*  --   --  137*  --   ALKPHOS 49  --   --  48  --   BILITOT 0.8  --   --  0.6  --   PROT 5.5*  --   --  5.4*  --   ALBUMIN 2.3* 2.3* 2.2* 2.3*   2.2* 2.6*   Evidence of right ventricular dysfunction and recent echocardiogram -Possible ASD -Consider repeat transthoracic echo when more stable from encephalopathy  Mobility: Encourage ambulation Living condition: Group home Goals of care:   Code Status: Full Code  Nutritional status: Body mass index is 25.95 kg/m.      Diet:  Diet Order             Diet renal with fluid restriction Fluid restriction: Other (see comments); Room service appropriate? Yes; Fluid consistency: Thin  Diet effective now                  DVT prophylaxis:  heparin injection 5,000 Units Start: 01/16/21 1545   Antimicrobials: None Fluid: None Consultants: Nephrology Family Communication: None at bedside  Status is: Inpatient  Continue in-hospital care because: Improving renal function Level of care: Med-Surg   Dispo: The patient is from: Group home              Anticipated d/c is to: SNF              Patient currently is not medically stable to d/c.   Difficult to place patient No     Infusions:   sodium chloride Stopped (01/08/21 2354)   sodium chloride     lacosamide (VIMPAT) IV 100 mg (01/17/21 1032)    Scheduled Meds:  aspirin  325 mg Oral Daily   Or   aspirin  300 mg Rectal Daily   busPIRone  10 mg  Oral BID   Chlorhexidine Gluconate Cloth  6 each Topical Daily   docusate sodium  100 mg Oral BID   FLUoxetine  40 mg Oral Daily   heparin injection (subcutaneous)  5,000 Units Subcutaneous Q8H   influenza vac split quadrivalent PF  0.5 mL Intramuscular Tomorrow-1000   mouth rinse  15 mL Mouth Rinse BID   polyethylene glycol  17 g Oral Daily   QUEtiapine  100 mg Oral BID   sodium chloride flush  3 mL Intravenous Q12H   traZODone  100 mg Oral QHS    PRN meds: sodium chloride, albuterol, diphenhydrAMINE, docusate sodium, ondansetron (ZOFRAN) IV, oxyCODONE, polyethylene glycol, sodium  chloride flush   Antimicrobials: Anti-infectives (From admission, onward)    Start     Dose/Rate Route Frequency Ordered Stop   01/07/21 1500  vancomycin (VANCOREADY) IVPB 750 mg/150 mL  Status:  Discontinued        750 mg 150 mL/hr over 60 Minutes Intravenous Every 24 hours 01/06/21 1326 01/07/21 0802   01/07/21 0802  vancomycin variable dose per unstable renal function (pharmacist dosing)  Status:  Discontinued         Does not apply See admin instructions 01/07/21 0802 01/07/21 1000   01/06/21 2200  cefTRIAXone (ROCEPHIN) 2 g in sodium chloride 0.9 % 100 mL IVPB  Status:  Discontinued        2 g 200 mL/hr over 30 Minutes Intravenous Every 12 hours 01/06/21 1635 01/07/21 1000   01/06/21 1600  ceFEPIme (MAXIPIME) 2 g in sodium chloride 0.9 % 100 mL IVPB  Status:  Discontinued        2 g 200 mL/hr over 30 Minutes Intravenous Every 24 hours 01/05/21 1612 01/05/21 1721   01/06/21 1415  vancomycin (VANCOREADY) IVPB 1500 mg/300 mL        1,500 mg 150 mL/hr over 120 Minutes Intravenous  Once 01/06/21 1326 01/06/21 1931   01/05/21 1845  Ampicillin-Sulbactam (UNASYN) 3 g in sodium chloride 0.9 % 100 mL IVPB  Status:  Discontinued        3 g 200 mL/hr over 30 Minutes Intravenous Every 12 hours 01/05/21 1754 01/06/21 1635   01/05/21 1645  vancomycin (VANCOCIN) IVPB 1000 mg/200 mL premix        1,000 mg 200  mL/hr over 60 Minutes Intravenous  Once 01/05/21 1513 01/05/21 1752   01/05/21 1612  vancomycin variable dose per unstable renal function (pharmacist dosing)  Status:  Discontinued         Does not apply See admin instructions 01/05/21 1612 01/05/21 1721   01/05/21 1515  ceFEPIme (MAXIPIME) 2 g in sodium chloride 0.9 % 100 mL IVPB        2 g 200 mL/hr over 30 Minutes Intravenous  Once 01/05/21 1513 01/05/21 1648       Objective: Vitals:   01/17/21 0213 01/17/21 1213  BP: (!) 144/91 (!) 142/99  Pulse: 81 74  Resp:  14  Temp: 98.8 F (37.1 C) 97.9 F (36.6 C)  SpO2: 98% 98%    Intake/Output Summary (Last 24 hours) at 01/17/2021 1403 Last data filed at 01/17/2021 0900 Gross per 24 hour  Intake 1705.44 ml  Output 2350 ml  Net -644.56 ml   Filed Weights   01/13/21 0500 01/16/21 0500 01/17/21 0512  Weight: 81.5 kg 78.6 kg 77.4 kg   Weight change: -1.2 kg Body mass index is 25.95 kg/m.   Physical Exam: General exam: Pleasant, middle-aged Caucasian male.  Not in physical distress. Skin: No rashes, lesions or ulcers. HEENT: Atraumatic, normocephalic, no obvious bleeding Lungs: Clear to auscultation bilaterally CVS: Regular rate and rhythm, no murmur GI/Abd soft, nontender, nondistended, and bowel sound present  CNS: Alert, awake, oriented x3 Psychiatry: Mood appropriate Extremities: No pedal edema, no calf tenderness.  Left ankle swollen, warm, tender.  Data Review: I have personally reviewed the laboratory data and studies available.  F/u labs ordered Unresulted Labs (From admission, onward)     Start     Ordered   01/17/21 0500  CBC with Differential/Platelet  Daily,   R     Question:  Specimen collection method  Answer:  Lab=Lab collect  01/16/21 1021   01/17/21 0500  Basic metabolic panel  Daily,   R     Question:  Specimen collection method  Answer:  Lab=Lab collect   01/16/21 1021   01/17/21 0500  CK  Daily,   R     Question:  Specimen collection method  Answer:   Lab=Lab collect   01/16/21 1021   Signed and Held  Hepatitis B surface antigen  (New Admission Hemo Labs (Hepatitis B))  Once,   R       Question:  Specimen collection method  Answer:  Lab=Lab collect   Signed and Held   Signed and Held  Hepatitis B surface antibody  (New Admission Hemo Labs (Hepatitis B))  Once,   R       Question:  Specimen collection method  Answer:  Lab=Lab collect   Signed and Held   Signed and Held  Hepatitis B surface antibody,quantitative  (New Admission Hemo Labs (Hepatitis B))  Once,   R       Question:  Specimen collection method  Answer:  Lab=Lab collect   Signed and Held            Signed, Lorin Glass, MD [facility] 01/17/2021

## 2021-01-18 ENCOUNTER — Inpatient Hospital Stay (HOSPITAL_COMMUNITY): Payer: Medicaid Other

## 2021-01-18 DIAGNOSIS — R9431 Abnormal electrocardiogram [ECG] [EKG]: Secondary | ICD-10-CM

## 2021-01-18 LAB — BASIC METABOLIC PANEL WITH GFR
Anion gap: 8 (ref 5–15)
BUN: 19 mg/dL (ref 6–20)
CO2: 26 mmol/L (ref 22–32)
Calcium: 8.2 mg/dL — ABNORMAL LOW (ref 8.9–10.3)
Chloride: 108 mmol/L (ref 98–111)
Creatinine, Ser: 2.17 mg/dL — ABNORMAL HIGH (ref 0.61–1.24)
GFR, Estimated: 35 mL/min — ABNORMAL LOW
Glucose, Bld: 109 mg/dL — ABNORMAL HIGH (ref 70–99)
Potassium: 3.8 mmol/L (ref 3.5–5.1)
Sodium: 142 mmol/L (ref 135–145)

## 2021-01-18 LAB — ECHOCARDIOGRAM COMPLETE
AR max vel: 3.53 cm2
AV Area VTI: 3.37 cm2
AV Area mean vel: 2.99 cm2
AV Mean grad: 6.1 mmHg
AV Peak grad: 10.8 mmHg
Ao pk vel: 1.64 m/s
Area-P 1/2: 2.24 cm2
Height: 68 in
S' Lateral: 3 cm
Weight: 2730.18 oz

## 2021-01-18 LAB — CBC WITH DIFFERENTIAL/PLATELET
Abs Immature Granulocytes: 0.03 10*3/uL (ref 0.00–0.07)
Basophils Absolute: 0.1 10*3/uL (ref 0.0–0.1)
Basophils Relative: 1 %
Eosinophils Absolute: 0.3 10*3/uL (ref 0.0–0.5)
Eosinophils Relative: 5 %
HCT: 28 % — ABNORMAL LOW (ref 39.0–52.0)
Hemoglobin: 9.5 g/dL — ABNORMAL LOW (ref 13.0–17.0)
Immature Granulocytes: 1 %
Lymphocytes Relative: 31 %
Lymphs Abs: 2.1 10*3/uL (ref 0.7–4.0)
MCH: 32.9 pg (ref 26.0–34.0)
MCHC: 33.9 g/dL (ref 30.0–36.0)
MCV: 96.9 fL (ref 80.0–100.0)
Monocytes Absolute: 0.9 10*3/uL (ref 0.1–1.0)
Monocytes Relative: 14 %
Neutro Abs: 3.3 10*3/uL (ref 1.7–7.7)
Neutrophils Relative %: 48 %
Platelets: 301 10*3/uL (ref 150–400)
RBC: 2.89 MIL/uL — ABNORMAL LOW (ref 4.22–5.81)
RDW: 14.5 % (ref 11.5–15.5)
WBC: 6.6 10*3/uL (ref 4.0–10.5)
nRBC: 0 % (ref 0.0–0.2)

## 2021-01-18 LAB — CK: Total CK: 611 U/L — ABNORMAL HIGH (ref 49–397)

## 2021-01-18 LAB — URIC ACID: Uric Acid, Serum: 5.2 mg/dL (ref 3.7–8.6)

## 2021-01-18 IMAGING — MR MR ANKLE*L* W/O CM
4 of 5 series · 32 of 40 positions shown · non-contrast
Comparison: Ankle x-ray [DATE]

CLINICAL DATA: Ankle pain and swelling

EXAM:
MRI OF THE LEFT ANKLE WITHOUT CONTRAST
TECHNIQUE: Multiplanar, multisequence MR imaging of the ankle was performed. No
intravenous contrast was administered.

[Series 4: PD fat-sat · axial · left · 3.0mm · 0.33mm/px · z∈[-30,+126]mm · 9 of 40 slices shown]
[im 1/40]
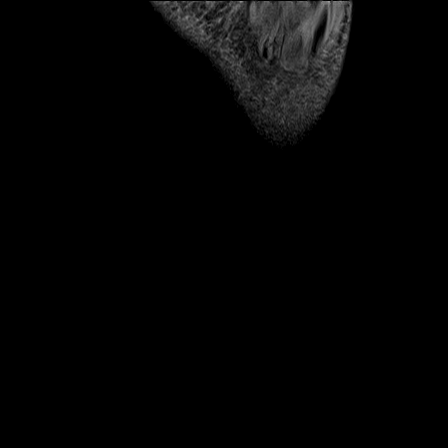
[im 5/40]
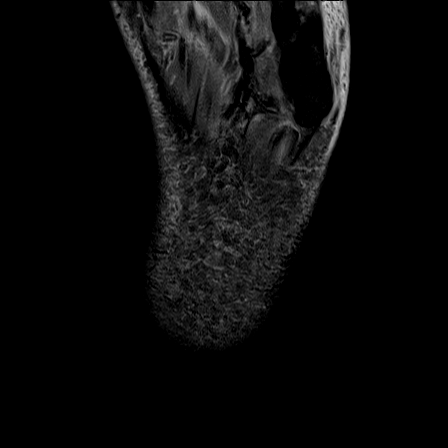
[im 10/40]
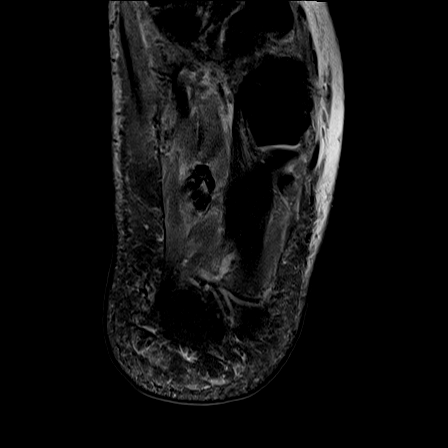
[im 15/40]
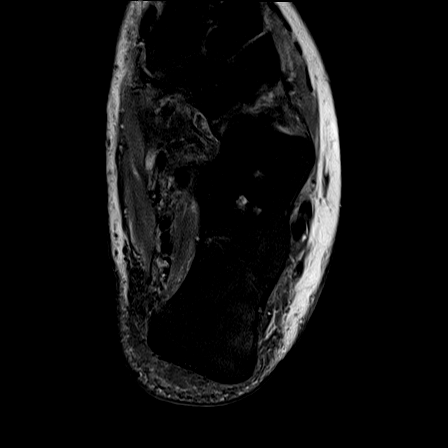
[im 20/40]
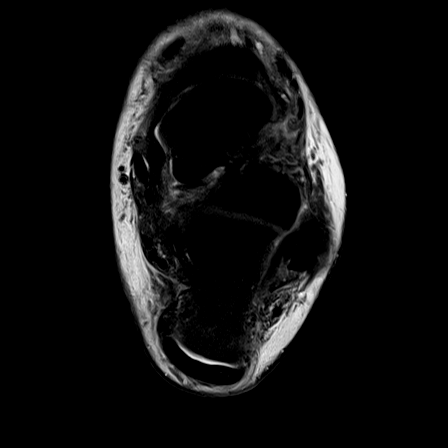
[im 25/40]
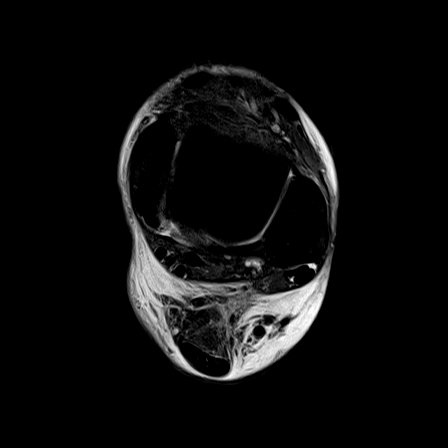
[im 30/40]
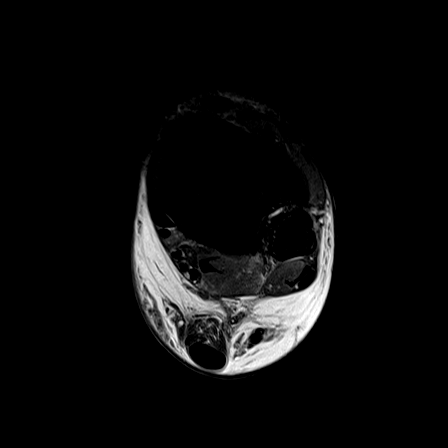
[im 35/40]
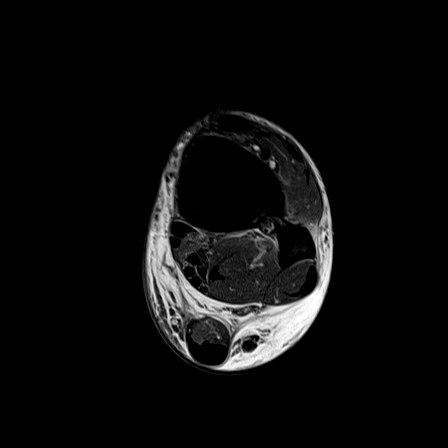
[im 40/40]
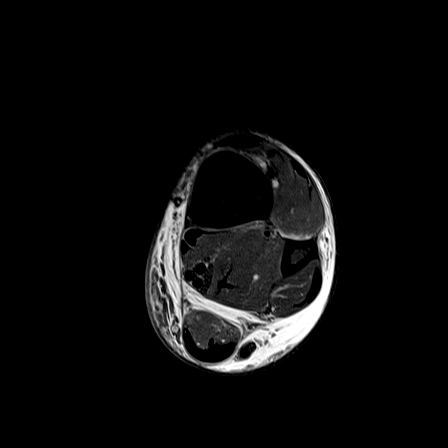

[Series 5: T2 fat-sat · axial · left · 3.0mm · 0.44mm/px · z∈[-30,+126]mm · 8 of 40 slices shown (1 of 2)]
[im 1/40]
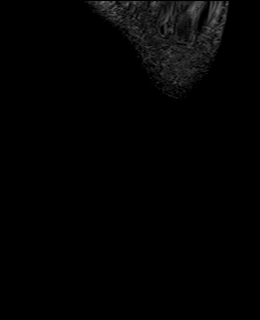
[im 5/40]
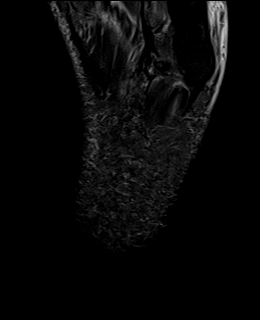
[im 14/40]
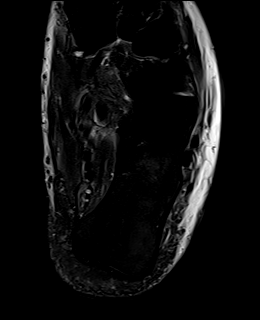
[im 18/40]
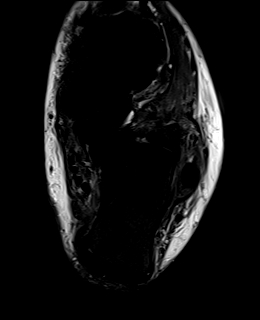
[im 22/40]
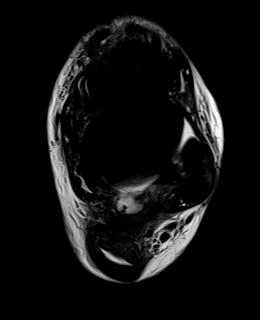
[im 27/40]
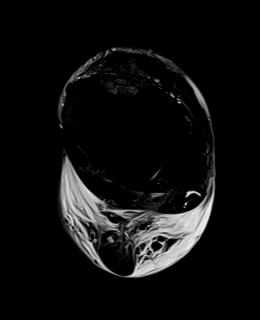
[im 35/40]
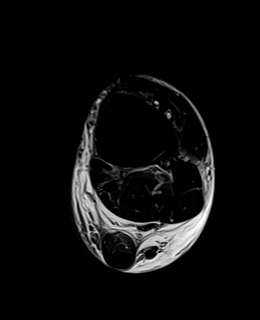
[im 40/40]
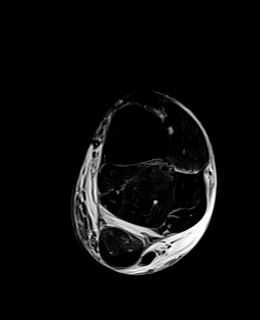

[Series 6: T2 fat-sat · coronal · left · 3.0mm · 0.36mm/px · 9 of 36 slices shown (2 of 2)]
[im 1/36]
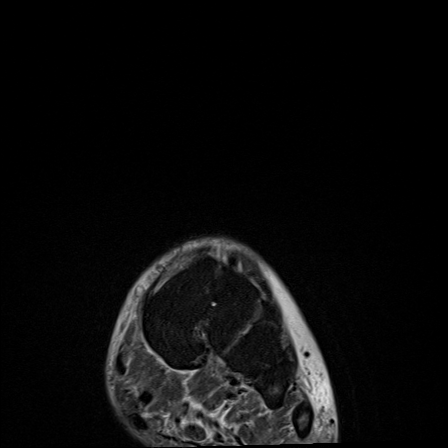
[im 5/36]
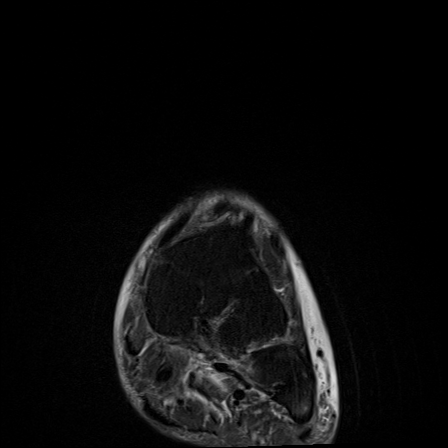
[im 9/36]
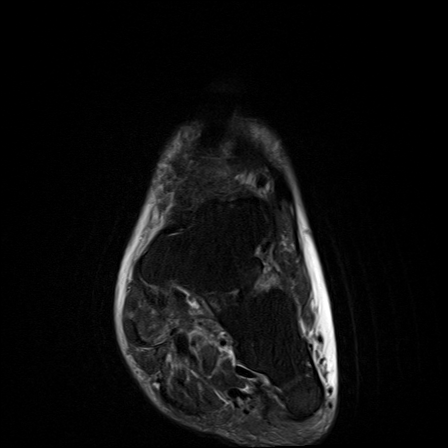
[im 14/36]
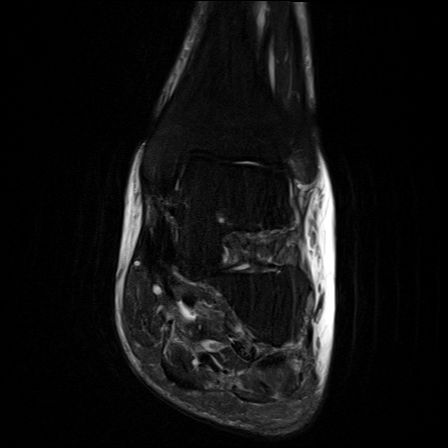
[im 18/36]
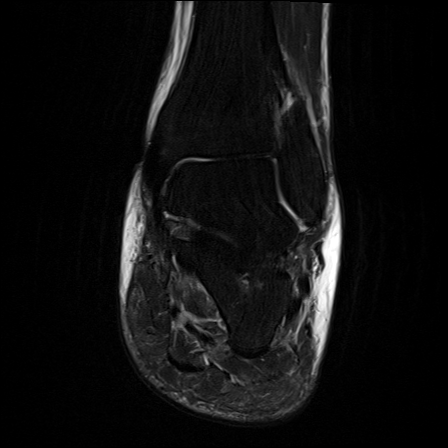
[im 22/36]
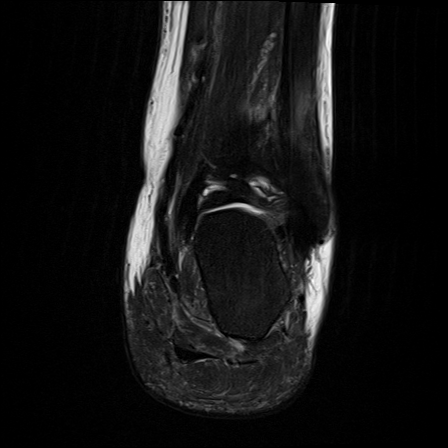
[im 27/36]
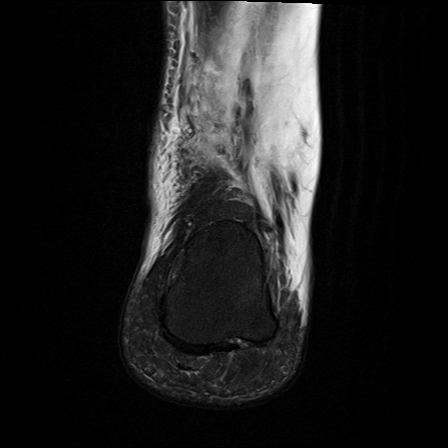
[im 31/36]
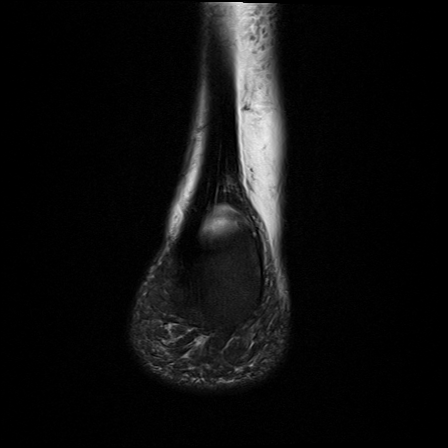
[im 36/36]
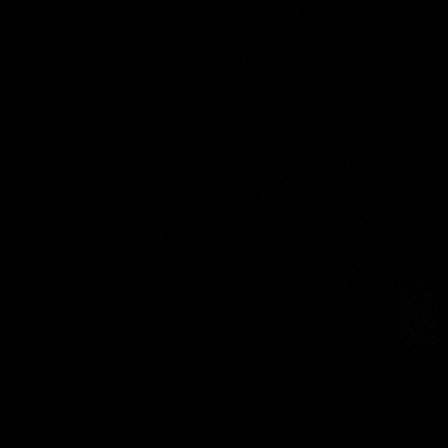

[Series 7: T1 · sagittal · left · 4.0mm · 0.42mm/px · 6 of 23 slices shown]
[im 1/23]
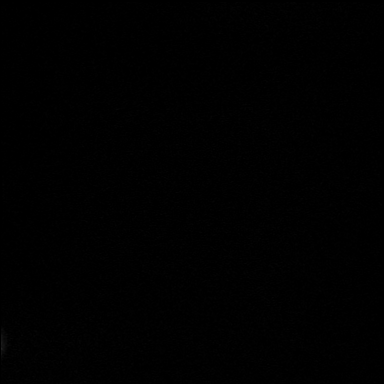
[im 5/23]
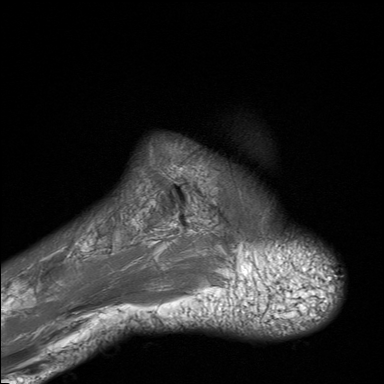
[im 9/23]
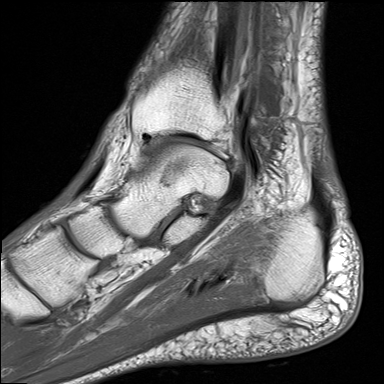
[im 14/23]
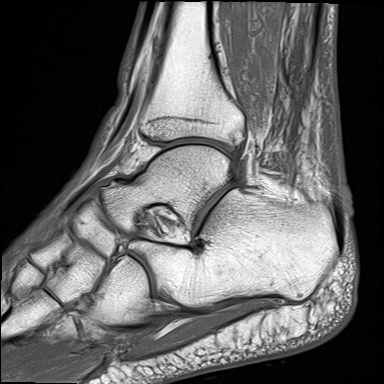
[im 18/23]
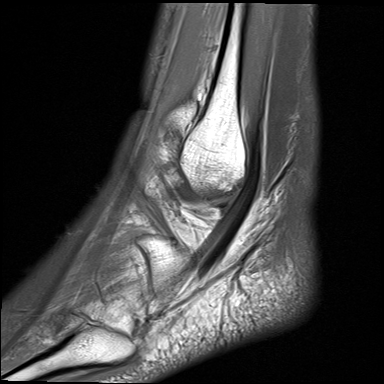
[im 23/23]
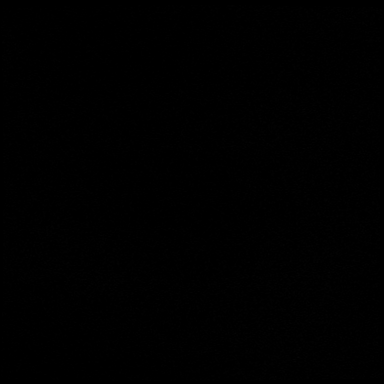

[32 of 40 positions shown; findings below may reference images not displayed]

FINDINGS: TENDONS

Peroneal: Intact peroneus longus and peroneus brevis tendons.

Posteromedial: Intact tibialis posterior, flexor hallucis longus and
flexor digitorum longus tendons.

Anterior: Intact tibialis anterior, extensor hallucis longus and
extensor digitorum longus tendons.

Achilles: Intact.

Plantar Fascia: Intact.

LIGAMENTS

Lateral: Intact.

Medial: Intact.

CARTILAGE

Ankle Joint: No joint effusion or chondral defect.

Subtalar Joints/Sinus Tarsi: No joint effusion or chondral defect.

Bones: No marrow signal abnormality. No fracture or dislocation.

Other: Diffuse subcutaneous soft tissue edema throughout the ankle
most prominent posteriorly and in the visualized dorsal lateral
foot.
IMPRESSION: 1. No acute osseous abnormality or internal derangement in the
ankle.
2. Diffuse subcutaneous soft tissue swelling.

## 2021-01-18 MED ORDER — ENSURE ENLIVE PO LIQD
237.0000 mL | Freq: Two times a day (BID) | ORAL | Status: DC
  Administered 2021-01-18 – 2021-03-07 (×86): 237 mL via ORAL

## 2021-01-18 MED ORDER — MORPHINE SULFATE (PF) 2 MG/ML IV SOLN
2.0000 mg | INTRAVENOUS | Status: DC | PRN
  Administered 2021-01-18 – 2021-01-20 (×5): 2 mg via INTRAVENOUS
  Filled 2021-01-18 (×5): qty 1

## 2021-01-18 NOTE — Progress Notes (Signed)
*  PRELIMINARY RESULTS* Echocardiogram 2D Echocardiogram has been performed.  Stacey Drain 01/18/2021, 12:11 PM

## 2021-01-18 NOTE — Progress Notes (Signed)
Sent Dr. Pola Corn secure chat making him aware that patients diastolic BP looks like it stays between 88-96 diastolic over the past couple days with current BP 147/93. No response at this time.

## 2021-01-18 NOTE — Progress Notes (Signed)
Patient asked for ensure therefore RN sent Dr. Pietro Cassis secure chat. MD gave order for ensure.

## 2021-01-18 NOTE — Progress Notes (Signed)
PROGRESS NOTE  Steve [name]  DOB: 07-16-1965  PCP: No primary care provider on file. [id number]  DOA: 01/05/2021  LOS: 13 days  Hospital Day: 74  Chief Complaint  Patient presents with   Altered Mental Status   Seizures    Brief narrative: Steve [name] is a 56 y.o. male with history of major depression, anxiety who was in a group home for last 5 months. He was brought to the ED on 12/20 from a group home for convalescence, combativeness and altered sensorium.  Per collateral history, patient was not acting typical for last few days. In the ED, patient was confused, combative Labs showed AKI with creatinine of 3.27, rhabdomyolysis, abnormal LFTs, elevated WBC count at 23.7 and lactic acid level at 7.6. 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  IV fluid resuscitation started Patient was admitted to ICU. 12/21, underwent LP which showed staph in CSF, felt to be contaminant.  EEG did not show any seizures 12/24, HD catheter was placed but patient did not require HD as creatinine started to improve 12/31, with clinical improvement, patient was transferred out of ICU to [facility].  Subjective: Patient was seen and examined this morning.  Lying down in bed.  Patient was in pain this morning.  He states that his left ankle is more painful in last 24 hours.  X-ray yesterday was unremarkable.  Assessment/Plan: Acute renal failure secondary to rhabdomyolysis Acute metabolic acidosis -His creatinine continue to worsen to peak at 7.48 on 12/26.  He was prepped for dialysis, had a HD catheter placed but never needed dialysis. Creatinine is now steadily improving, down to 2.17 today.  Serum mag level improving as well. -Nephrology consult appreciated -Continue normal saline.  Continue to monitor Recent Labs     01/09/21 0625 01/10/21 0741 01/11/21 0217 01/12/21 0639 01/13/21 0241 01/14/21 0207 01/15/21 0100 01/16/21 0141 01/17/21 0141 01/18/21 0213  BUN 91* 89* 84* 76* 70* 62* 54* 39* 27* 19  CREATININE 7.26* 7.61* 7.48* 7.20* 7.03* 6.31* 5.38* 4.35* 2.98* 2.17*  CO2 17* 24 24 31 31 29 29 28 27 26     Rhabdomyolysis -Primary cause of AKI.  CK level improving with IV fluid.  Continue to monitor. Recent Labs  Lab 01/12/21 0953 01/15/21 0100 01/17/21 0141 01/18/21 0213  CKTOTAL 5,035* 2,328* 1,023* 611*     Uremic encephalopathy -Was combative on admission. Mental status improved with improvement in uremia. -Patient states he has history of major depression and anxiety.  Home med list shows he was on Prozac 40 mg daily, Neurontin 400 mg 3 times daily, Atarax 50 mg every 4 hours for anxiety and trazodone 50 mg at bedtime as needed -Currently he is on Prozac 40 mg daily, Seroquel 100 mg twice daily, BuSpar 10 mg twice daily, trazodone 100 mg nightly.    Left ankle swelling -Ongoing for last 24 to 48 hours.  Patient has swelling, pain and tenderness and decreased range of motion of left ankle.  Negative x-ray yesterday.  Obtain MRI.  Started on pain medicine.  Seizure -Generalized convulsion was noted by his roommate prior to presentation. -EEG unremarkable.  Neurology consult appreciated.  Currently on Vimpat 100 mg twice daily given renal failure and possibility of Keppra contributing to agitation.  ?? Upper extremity DVT -Per critical care note, hemodialysis cath had a clot on it when it was removed.  Ultrasound duplex scan of upper extremities was obtained on 12/31 which did not show any evidence of DVT.  Because of his risk of falls, noncompliance, recurrent seizures, I would avoid long-term anticoagulation without definite evidence of DVT.  I stopped heparin drip.  Aspiration pneumonia -Completed Zosyn -Isolated fever 12/27-has been stable, no associated leukocytosis -No  recurrence of the fever  Hypokalemia/hypomagnesemia -Potassium and magnesium replacement given Recent Labs  Lab 01/13/21 0241 01/14/21 0207 01/15/21 0100 01/15/21 0808 01/16/21 0141 01/17/21 0141 01/18/21 0213  K 3.5 3.2* 3.2*  --  3.4* 3.5 3.8  MG  --   --   --  1.5*  --  2.3  --   PHOS 4.8* 5.4* 4.5  --  3.5 3.6  --     Acute liver injury -Continue to trend LFTs -Levels improving. Recent Labs  Lab 01/12/21 0953 01/13/21 0241 01/14/21 0207 01/15/21 0100 01/16/21 0141  AST 172*  --   --  91*  --   ALT 241*  --   --  137*  --   ALKPHOS 49  --   --  48  --   BILITOT 0.8  --   --  0.6  --   PROT 5.5*  --   --  5.4*  --   ALBUMIN 2.3* 2.3* 2.2* 2.3*   2.2* 2.6*    Evidence of right ventricular dysfunction and recent echocardiogram -Echo 1/2 with EF 60 to 65%.  Cannot rule out a small PFO. -Not clinically significant as patient has no symptoms related to it. Continue to monitor as an outpatient.  Mobility: Encourage ambulation Living condition: Group home Goals of care:   Code Status: Full Code  Nutritional status: Body mass index is 25.95 kg/m.      Diet:  Diet Order             Diet renal with fluid restriction Fluid restriction: Other (see comments); Room service appropriate? Yes; Fluid consistency: Thin  Diet effective now                  DVT prophylaxis:  heparin injection 5,000 Units Start: 01/16/21 1545   Antimicrobials: None Fluid: None Consultants: Nephrology Family Communication: None at bedside  Status is: Inpatient  Continue in-hospital care because: Improving renal function, left ankle swelling and pain worse. Level of care: Med-Surg   Dispo: The patient is from: Group home              Anticipated d/c is to: SNF              Patient currently is not medically stable to d/c.   Difficult to place patient No     Infusions:   sodium chloride Stopped (01/08/21 2354)   sodium chloride 75 mL/hr at 01/18/21 1022   lacosamide  (VIMPAT) IV 100 mg (01/18/21 1023)    Scheduled Meds:  aspirin  325 mg Oral Daily   Or   aspirin  300 mg Rectal Daily   busPIRone  10 mg Oral BID   Chlorhexidine Gluconate Cloth  6 each Topical Daily   docusate sodium  100 mg Oral BID   feeding supplement  237 mL Oral BID BM   FLUoxetine  40 mg Oral Daily   heparin injection (subcutaneous)  5,000 Units Subcutaneous Q8H   influenza vac split quadrivalent PF  0.5 mL Intramuscular Tomorrow-1000   mouth rinse  15 mL Mouth Rinse BID   nicotine  21 mg Transdermal Daily   polyethylene  glycol  17 g Oral Daily   QUEtiapine  100 mg Oral BID   sodium chloride flush  3 mL Intravenous Q12H   traZODone  100 mg Oral QHS    PRN meds: sodium chloride, albuterol, diphenhydrAMINE, docusate sodium, ondansetron (ZOFRAN) IV, oxyCODONE, polyethylene glycol, sodium chloride flush   Antimicrobials: Anti-infectives (From admission, onward)    Start     Dose/Rate Route Frequency Ordered Stop   01/07/21 1500  vancomycin (VANCOREADY) IVPB 750 mg/150 mL  Status:  Discontinued        750 mg 150 mL/hr over 60 Minutes Intravenous Every 24 hours 01/06/21 1326 01/07/21 0802   01/07/21 0802  vancomycin variable dose per unstable renal function (pharmacist dosing)  Status:  Discontinued         Does not apply See admin instructions 01/07/21 0802 01/07/21 1000   01/06/21 2200  cefTRIAXone (ROCEPHIN) 2 g in sodium chloride 0.9 % 100 mL IVPB  Status:  Discontinued        2 g 200 mL/hr over 30 Minutes Intravenous Every 12 hours 01/06/21 1635 01/07/21 1000   01/06/21 1600  ceFEPIme (MAXIPIME) 2 g in sodium chloride 0.9 % 100 mL IVPB  Status:  Discontinued        2 g 200 mL/hr over 30 Minutes Intravenous Every 24 hours 01/05/21 1612 01/05/21 1721   01/06/21 1415  vancomycin (VANCOREADY) IVPB 1500 mg/300 mL        1,500 mg 150 mL/hr over 120 Minutes Intravenous  Once 01/06/21 1326 01/06/21 1931   01/05/21 1845  Ampicillin-Sulbactam (UNASYN) 3 g in sodium chloride 0.9  % 100 mL IVPB  Status:  Discontinued        3 g 200 mL/hr over 30 Minutes Intravenous Every 12 hours 01/05/21 1754 01/06/21 1635   01/05/21 1645  vancomycin (VANCOCIN) IVPB 1000 mg/200 mL premix        1,000 mg 200 mL/hr over 60 Minutes Intravenous  Once 01/05/21 1513 01/05/21 1752   01/05/21 1612  vancomycin variable dose per unstable renal function (pharmacist dosing)  Status:  Discontinued         Does not apply See admin instructions 01/05/21 1612 01/05/21 1721   01/05/21 1515  ceFEPIme (MAXIPIME) 2 g in sodium chloride 0.9 % 100 mL IVPB        2 g 200 mL/hr over 30 Minutes Intravenous  Once 01/05/21 1513 01/05/21 1648       Objective: Vitals:   01/18/21 0745 01/18/21 1114  BP: (!) 143/90 (!) 151/88  Pulse: 71 69  Resp: 14 20  Temp: 98.3 F (36.8 C) 98.7 F (37.1 C)  SpO2: 100% 93%    Intake/Output Summary (Last 24 hours) at 01/18/2021 1330 Last data filed at 01/18/2021 1022 Gross per 24 hour  Intake 2282.19 ml  Output 3000 ml  Net -717.81 ml    Filed Weights   01/13/21 0500 01/16/21 0500 01/17/21 0512  Weight: 81.5 kg 78.6 kg 77.4 kg   Weight change:  Body mass index is 25.95 kg/m.   Physical Exam: General exam: Pleasant, middle-aged Caucasian male.  Not in physical distress. Skin: No rashes, lesions or ulcers. HEENT: Atraumatic, normocephalic, no obvious bleeding Lungs: Clear to auscultation bilaterally CVS: Regular rate and rhythm, no murmur GI/Abd soft, nontender, nondistended, and bowel sound present  CNS: Alert, awake, oriented x3. Psychiatry: Mood appropriate Extremities: No pedal edema, no calf tenderness.  Left ankle continues to be swollen, warm, tender.  Data Review: I have personally reviewed  the laboratory data and studies available.  F/u labs ordered Unresulted Labs (From admission, onward)     Start     Ordered   01/17/21 0500  CBC with Differential/Platelet  Daily,   R     Question:  Specimen collection method  Answer:  Lab=Lab collect    01/16/21 1021   01/17/21 [id number]  Basic metabolic panel  Daily,   R     Question:  Specimen collection method  Answer:  Lab=Lab collect   01/16/21 1021   01/17/21 0500  CK  Daily,   R     Question:  Specimen collection method  Answer:  Lab=Lab collect   01/16/21 1021   Signed and Held  Hepatitis B surface antigen  (New Admission Hemo Labs (Hepatitis B))  Once,   R       Question:  Specimen collection method  Answer:  Lab=Lab collect   Signed and Held   Signed and Held  Hepatitis B surface antibody  (New Admission Hemo Labs (Hepatitis B))  Once,   R       Question:  Specimen collection method  Answer:  Lab=Lab collect   Signed and Held   Signed and Held  Hepatitis B surface antibody,quantitative  (New Admission Hemo Labs (Hepatitis B))  Once,   R       Question:  Specimen collection method  Answer:  Lab=Lab collect   Signed and Held            Signed, Terrilee Croak, MD [facility] 01/18/2021

## 2021-01-19 ENCOUNTER — Encounter (HOSPITAL_COMMUNITY): Payer: Self-pay | Admitting: Internal Medicine

## 2021-01-19 LAB — CBC WITH DIFFERENTIAL/PLATELET
Abs Immature Granulocytes: 0.02 10*3/uL (ref 0.00–0.07)
Basophils Absolute: 0.1 10*3/uL (ref 0.0–0.1)
Basophils Relative: 1 %
Eosinophils Absolute: 0.3 10*3/uL (ref 0.0–0.5)
Eosinophils Relative: 4 %
HCT: 27.1 % — ABNORMAL LOW (ref 39.0–52.0)
Hemoglobin: 9.3 g/dL — ABNORMAL LOW (ref 13.0–17.0)
Immature Granulocytes: 0 %
Lymphocytes Relative: 40 %
Lymphs Abs: 2.5 10*3/uL (ref 0.7–4.0)
MCH: 32.9 pg (ref 26.0–34.0)
MCHC: 34.3 g/dL (ref 30.0–36.0)
MCV: 95.8 fL (ref 80.0–100.0)
Monocytes Absolute: 0.9 10*3/uL (ref 0.1–1.0)
Monocytes Relative: 15 %
Neutro Abs: 2.5 10*3/uL (ref 1.7–7.7)
Neutrophils Relative %: 40 %
Platelets: 325 10*3/uL (ref 150–400)
RBC: 2.83 MIL/uL — ABNORMAL LOW (ref 4.22–5.81)
RDW: 14.2 % (ref 11.5–15.5)
WBC: 6.3 10*3/uL (ref 4.0–10.5)
nRBC: 0 % (ref 0.0–0.2)

## 2021-01-19 LAB — BASIC METABOLIC PANEL
Anion gap: 6 (ref 5–15)
BUN: 16 mg/dL (ref 6–20)
CO2: 23 mmol/L (ref 22–32)
Calcium: 7.8 mg/dL — ABNORMAL LOW (ref 8.9–10.3)
Chloride: 112 mmol/L — ABNORMAL HIGH (ref 98–111)
Creatinine, Ser: 1.6 mg/dL — ABNORMAL HIGH (ref 0.61–1.24)
GFR, Estimated: 51 mL/min — ABNORMAL LOW (ref >60)
Glucose, Bld: 83 mg/dL (ref 70–99)
Potassium: 3.5 mmol/L (ref 3.5–5.1)
Sodium: 141 mmol/L (ref 135–145)

## 2021-01-19 LAB — CK: Total CK: 376 U/L (ref 49–397)

## 2021-01-19 MED ORDER — LACOSAMIDE 100 MG PO TABS
100.0000 mg | ORAL_TABLET | Freq: Two times a day (BID) | ORAL | 0 refills | Status: DC
Start: 2021-01-19 — End: 2021-05-13

## 2021-01-19 MED ORDER — OXYCODONE-ACETAMINOPHEN 5-325 MG PO TABS: 1.0000 | ORAL_TABLET | Freq: Four times a day (QID) | ORAL | 0 refills | Status: DC | PRN

## 2021-01-19 MED ORDER — OXYCODONE HCL 5 MG PO TABS
10.0000 mg | ORAL_TABLET | Freq: Four times a day (QID) | ORAL | Status: AC
  Administered 2021-01-19 – 2021-01-22 (×11): 10 mg via ORAL
  Filled 2021-01-19 (×11): qty 2

## 2021-01-19 MED ORDER — FLUOXETINE HCL 20 MG PO CAPS: 40.0000 mg | ORAL_CAPSULE | Freq: Every day | ORAL | 0 refills | Status: DC

## 2021-01-19 MED ORDER — AMLODIPINE BESYLATE 2.5 MG PO TABS: 2.5000 mg | ORAL_TABLET | Freq: Every day | ORAL | 2 refills | Status: DC

## 2021-01-19 MED ORDER — TRAZODONE HCL 50 MG PO TABS: 50.0000 mg | ORAL_TABLET | Freq: Every evening | ORAL | 0 refills | Status: DC | PRN

## 2021-01-19 MED ORDER — BUSPIRONE HCL 10 MG PO TABS: 10.0000 mg | ORAL_TABLET | Freq: Two times a day (BID) | ORAL | 0 refills | Status: DC

## 2021-01-19 MED ORDER — QUETIAPINE FUMARATE 100 MG PO TABS: 100.0000 mg | ORAL_TABLET | Freq: Two times a day (BID) | ORAL | 0 refills | Status: DC

## 2021-01-19 MED ORDER — AMLODIPINE BESYLATE 2.5 MG PO TABS
2.5000 mg | ORAL_TABLET | Freq: Every day | ORAL | Status: DC
  Administered 2021-01-19 – 2021-03-27 (×68): 2.5 mg via ORAL
  Filled 2021-01-19 (×70): qty 1

## 2021-01-19 NOTE — Progress Notes (Addendum)
Physical Therapy Treatment Patient Details Name: Steve [name] MRN: [id number] DOB: 09/13/1965 Today's Date: 01/19/2021   History of Present Illness Pt is a 56 y/o male who presents from his group home (drug/alcohol rehab?) s/p witnessed seizure and AMS. He was found to have AKI and rhabdomyolysis. No PMH noted in the chart.    PT Comments    Pt received in bed, mildly agitated and voicing his desire to leave the hospital. He demonstrates mod I bed mobility. Supervision for sit to stand with RW, and min guard assist SPT. In stance, pt is holding L foot off the floor and maintaining NWB. He is adamantly refusing LLE WB attempts or ambulation with RW. Attempted to instruct pt on hop-to gait with RW with NWB LLE but he continues to refuse. It is the opinion of this therapist that pt can function at home at a wheelchair level. Unsure if he has a safe d/c environment. When asked about his group home, pt reports he will not be going back there. Pt reports he is going to his son's home, which is a 2nd floor apt. Pt instructed on bumping up steps on his bottom and using w/c as primary mobility in the apartment. He states he can't use a w/c where he is going, despite education. Discharge recommendation updated to HHPT, pending safe d/c environment available.    Recommendations for follow up therapy are one component of a multi-disciplinary discharge planning process, led by the attending physician.  Recommendations may be updated based on patient status, additional functional criteria and insurance authorization.  Follow Up Recommendations  Home health PT (if going to wheelchair accessible environment)     Assistance Recommended at Discharge Intermittent Supervision/Assistance  Patient can return home with the following Help with stairs or ramp for entrance;Assist for transportation;Assistance with cooking/housework   Equipment Recommendations  Rolling walker (2 wheels);Wheelchair (measurements PT)     Recommendations for Other Services       Precautions / Restrictions Precautions Precautions: Fall Restrictions Weight Bearing Restrictions: No LLE Weight Bearing: Weight bearing as tolerated     Mobility  Bed Mobility Overal bed mobility: Modified Independent                  Transfers Overall transfer level: Needs assistance Equipment used: Rolling walker (2 wheels) Transfers: Sit to/from Stand;Bed to chair/wheelchair/BSC Sit to Stand: Supervision Stand pivot transfers: Min guard         General transfer comment: assist for safety. Pt maintaining NWB LLE. Unwilling to attempt WB    Ambulation/Gait               General Gait Details: Pt refusing.   Stairs             Wheelchair Mobility    Modified Rankin (Stroke Patients Only)       Balance Overall balance assessment: Needs assistance Sitting-balance support: Feet supported;No upper extremity supported Sitting balance-Leahy Scale: Normal     Standing balance support: Bilateral upper extremity supported;No upper extremity supported Standing balance-Leahy Scale: Fair                              Cognition Arousal/Alertness: Awake/alert Behavior During Therapy: Anxious;Flat affect;Impulsive Overall Cognitive Status: No family/caregiver present to determine baseline cognitive functioning  General Comments: attempting to leave AMA. Poor safety awareness. Poor problem solving.        Exercises      General Comments        Pertinent Vitals/Pain Pain Assessment: Faces Faces Pain Scale: Hurts even more Pain Location: LLE Pain Descriptors / Indicators: Guarding Pain Intervention(s): Limited activity within patient's tolerance;Monitored during session;Repositioned    Home Living                          Prior Function            PT Goals (current goals can now be found in the care plan section) Acute Rehab  PT Goals Patient Stated Goal: home Progress towards PT goals: Progressing toward goals    Frequency    Min 3X/week      PT Plan Discharge plan needs to be updated;Equipment recommendations need to be updated    Co-evaluation              AM-PAC PT "6 Clicks" Mobility   Outcome Measure  Help needed turning from your back to your side while in a flat bed without using bedrails?: None Help needed moving from lying on your back to sitting on the side of a flat bed without using bedrails?: None Help needed moving to and from a bed to a chair (including a wheelchair)?: A Little Help needed standing up from a chair using your arms (e.g., wheelchair or bedside chair)?: A Little Help needed to walk in hospital room?: A Lot Help needed climbing 3-5 steps with a railing? : A Lot 6 Click Score: 18    End of Session Equipment Utilized During Treatment: Gait belt Activity Tolerance: Patient limited by pain Patient left: in bed;with call bell/phone within reach;with bed alarm set Nurse Communication: Mobility status PT Visit Diagnosis: Unsteadiness on feet (R26.81);Pain Pain - Right/Left: Left Pain - part of body: Ankle and joints of foot     Time: [id number] PT Time Calculation (min) (ACUTE ONLY): 11 min  Charges:  $Therapeutic Activity: 8-22 mins                     Lorrin Goodell, PT  Office # [phone] Pager [phone]    Lorriane Shire 01/19/2021, 10:30 AM

## 2021-01-19 NOTE — Progress Notes (Signed)
Writer went to  room because bed alarm was  going off, patient attempting to get out of bed. Writer advised patient to sit down before he falls. Patient states he is leaving and going to the store, while attempting to put on his jeans. Writer explained to patient that he is not allowed to leave and it is unsafe for him to get out of bed without assistance. Dr. Pola Corn notified and came to room to discuss plan  of care with patient. Patient agreeable at this time to see physical therapy

## 2021-01-19 NOTE — Progress Notes (Addendum)
Patient currently irritable, Appears to be generally anxious, and is still AAOx4.  Mr. Steve [name] is refusing heparin SubQ dose for 0600 01/03.   When I asked what was upsetting him, he explains that "I don't know what is going on with all this [referring to current hospital stay and leg pain]. The doctor needs to explain more about what's going on. If they can't help, I need to leave this hospital. I will leave this room right now."   A discussion of Mr. Steve [name] leaving AMA was made and he expressed that he would do that. I expressed to him that the doctor would most likely be able to speak with him about his concerns this AM. I have passed the patient's concerns to the AM dayshift RN Maudie Mercury).

## 2021-01-19 NOTE — Progress Notes (Addendum)
Patient was trying to leave AMA this morning.  He wanted to get out to smoke.  He also stated that he called his son to pick him up to take him home to [place].  He did not want to go to group home. I talked to the patient and convinced him to stay for completion of work-up.  I got him seen by physical therapy.  Apparently he was not even able to stand up with them.  I called and talked to his son Mr. Donna Christen Stolz this afternoon.  The son states that he is not able to come and pick him up today.  But he can come tomorrow morning to take him home.  But this is to be difficulty patient cannot even stand up.  Without him being able to stand up he can go back to group home either. He has no insurance for SNF.  At this time, I would start him on Percocet and scheduled an ankle brace.  We will get him seen by PT again tomorrow.  If his ambulation improves, he can be a candidate for discharge in next 1 to 2 days.

## 2021-01-19 NOTE — Progress Notes (Signed)
Occupational Therapy Treatment Patient Details Name: Markeith Jue MRN: [id number] DOB: 1965-12-10 Today's Date: 01/19/2021   History of present illness Pt is a 56 y/o male who presents from his group home (drug/alcohol rehab?) s/p witnessed seizure and AMS. He was found to have AKI and rhabdomyolysis. No PMH noted in the chart.   OT comments  Pt pleasant and agreeable to participate this session, pt reported that he understands he would be unsafe to d/c at his current level (mostly due to pain). Pt was able to don his R sock, and attempted his L but limited by pain. However, he was able to tolerate the therapist donning it. Pt educated on some desensitizing techniques to apply to his LLE, he demonstrated great understanding. Pt transferred from bd>chair with min A overall, and verbal cues for safety. Pt required max encouragement to place L toes onto the ground during the transfer. D/c continues to be appropriate. OT to continue to follow acutely.   Recommendations for follow up therapy are one component of a multi-disciplinary discharge planning process, led by the attending physician.  Recommendations may be updated based on patient status, additional functional criteria and insurance authorization.    Follow Up Recommendations  Skilled nursing-short term rehab (<3 hours/day)    Assistance Recommended at Discharge Frequent or constant Supervision/Assistance  Patient can return home with the following  A little help with walking and/or transfers;A little help with bathing/dressing/bathroom;Assistance with cooking/housework;Assist for transportation;Help with stairs or ramp for entrance   Equipment Recommendations  None recommended by OT       Precautions / Restrictions Precautions Precautions: Fall Restrictions Weight Bearing Restrictions: Yes LLE Weight Bearing: Weight bearing as tolerated       Mobility Bed Mobility Overal bed mobility: Modified Independent                   Transfers Overall transfer level: Needs assistance Equipment used: Rolling walker (2 wheels) Transfers: Sit to/from Stand;Bed to chair/wheelchair/BSC Sit to Stand: Supervision Stand pivot transfers: Min assist         General transfer comment: min A due to pt attmepting to complete squat pivot without RW, assist to block knees. min G wtih use of RW.     Balance Overall balance assessment: Needs assistance Sitting-balance support: Feet supported;No upper extremity supported Sitting balance-Leahy Scale: Normal     Standing balance support: Bilateral upper extremity supported;No upper extremity supported Standing balance-Leahy Scale: Fair                             ADL either performed or assessed with clinical judgement   ADL Overall ADL's : Needs assistance/impaired                     Lower Body Dressing: Minimal assistance;Bed level Lower Body Dressing Details (indicate cue type and reason): pt able to don R sock, attempted L sock but it was too painful. therapist donned L. Bari socks used. Toilet Transfer: Stand-pivot;Minimal assistance;Rolling walker (2 wheels) Toilet Transfer Details (indicate cue type and reason): simulated from bed>recliner. Encouraged pt to keep his L toes on teh ground during transfer.         Functional mobility during ADLs: Minimal assistance;Rolling walker (2 wheels) General ADL Comments: pt impulsive with poor problem solving. educated on some desensitization techniqes to work on, he verbalized understanding    Extremity/Trunk Assessment Upper Extremity Assessment Upper Extremity Assessment: Overall WFL for tasks  assessed   Lower Extremity Assessment Lower Extremity Assessment: Defer to PT evaluation        Vision   Vision Assessment?: No apparent visual deficits   Perception Perception Perception: Within Functional Limits   Praxis Praxis Praxis: Intact    Cognition Arousal/Alertness: Awake/alert Behavior  During Therapy: Anxious;Flat affect;Impulsive Overall Cognitive Status: No family/caregiver present to determine baseline cognitive functioning                                 General Comments: pleasant but frustrated and anxious this session. verbalized understanding that he knows he is unsfae to d/c at his currentl funcitonal level                General Comments VSS on RA    Pertinent Vitals/ Pain       Pain Assessment: Faces Faces Pain Scale: Hurts little more Pain Location: LLE Pain Descriptors / Indicators: Guarding Pain Intervention(s): Limited activity within patient's tolerance;Monitored during session;Patient requesting pain meds-RN notified   Frequency  Min 2X/week        Progress Toward Goals  OT Goals(current goals can now be found in the care plan section)  Progress towards OT goals: Progressing toward goals  Acute Rehab OT Goals Patient Stated Goal: less pain OT Goal Formulation: With patient Time For Goal Achievement: 01/30/21 Potential to Achieve Goals: Good ADL Goals Pt Will Perform Grooming: with modified independence;standing Pt Will Perform Lower Body Bathing: with modified independence;with adaptive equipment;sitting/lateral leans;sit to/from stand Pt Will Perform Lower Body Dressing: with modified independence;with adaptive equipment;sitting/lateral leans;sit to/from stand Pt Will Transfer to Toilet: with modified independence;ambulating;grab bars Pt Will Perform Toileting - Clothing Manipulation and hygiene: with modified independence;sitting/lateral leans;sit to/from stand Additional ADL Goal #1: Pt will demonstrate decreased pain to LEs by tolerating light touch as needed for LE ADLs in order to increase independence with and tolerance for self care.  Plan Discharge plan remains appropriate    Co-evaluation                 AM-PAC OT "6 Clicks" Daily Activity     Outcome Measure   Help from another person eating  meals?: None Help from another person taking care of personal grooming?: A Little Help from another person toileting, which includes using toliet, bedpan, or urinal?: A Lot Help from another person bathing (including washing, rinsing, drying)?: A Lot Help from another person to put on and taking off regular upper body clothing?: A Little Help from another person to put on and taking off regular lower body clothing?: Total 6 Click Score: 15    End of Session Equipment Utilized During Treatment: Gait belt;Rolling walker (2 wheels)  OT Visit Diagnosis: Unsteadiness on feet (R26.81);Pain;Muscle weakness (generalized) (M62.81);Dizziness and giddiness (R42) Pain - Right/Left: Left Pain - part of body: Leg;Ankle and joints of foot   Activity Tolerance Patient limited by pain   Patient Left in chair;with call bell/phone within reach;with chair alarm set;with nursing/sitter in room   Nurse Communication Mobility status        Time: 2585-2778 OT Time Calculation (min): 17 min  Charges: OT General Charges $OT Visit: 1 Visit OT Treatments $Self Care/Home Management : 8-22 mins   Cotey Rakes A Demone Lyles 01/19/2021, 4:32 PM

## 2021-01-19 NOTE — Discharge Summary (Signed)
Physician Discharge Summary  Steve [name] [id number] DOB: Oct 11, 1965 DOA: 01/05/2021  PCP: No primary care provider on file.  Admit date: 01/05/2021 Discharge date: 01/19/2021  Admitted From: Home Discharge disposition: Home with home health   Code Status: Full Code   Discharge Diagnosis:   Principal Problem:   Acute metabolic encephalopathy Active Problems:   Severe sepsis without septic shock ([place])   Fever   Pressure injury of skin    Chief Complaint  Patient presents with   Altered Mental Status   Seizures    Brief narrative: Steve [name] is a 56 y.o. male with history of major depression, anxiety who was in a group home for last 5 months. He was brought to the ED on 12/20 from a group home for convalescence, combativeness and altered sensorium.  Per collateral history, patient was not acting typical for last few days. In the ED, patient was confused, combative Labs showed AKI with creatinine of 3.27, rhabdomyolysis, abnormal LFTs, elevated WBC count at 23.7 and lactic acid level at 7.6. 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  IV fluid resuscitation started Patient was admitted to ICU. 12/21, underwent LP which showed staph in CSF, felt to be contaminant.  EEG did not show any seizures 12/24, HD catheter was placed but patient did not require HD as creatinine started to improve 12/31, with clinical improvement, patient was transferred out of ICU to [facility]  Subjective: Patient was seen and examined this morning.  Lying down in bed.  Not in distress.  No new symptoms.  States he wants to go home and does not want to return to group home.   PT reevaluation was obtained.  Home with PT recommended.  Hospital course: Acute renal failure secondary to rhabdomyolysis Acute metabolic acidosis -His  creatinine continue to worsen to peak at 7.48 on 12/26.  He was prepped for dialysis, had a HD catheter placed but never needed dialysis. Creatinine steadily improved, down to 1.6 today was to his baseline.  Serum bicarbonate level improved as well.   -Nephrology consult appreciated -Continue normal saline.  Continue to monitor Recent Labs    01/10/21 0741 01/11/21 0217 01/12/21 0639 01/13/21 0241 01/14/21 0207 01/15/21 0100 01/16/21 0141 01/17/21 0141 01/18/21 0213 01/19/21 0158  BUN 89* 84* 76* 70* 62* 54* 39* 27* 19 16  CREATININE 7.61* 7.48* 7.20* 7.03* 6.31* 5.38* 4.35* 2.98* 2.17* 1.60*  CO2 24 24 31 31 29 29 28 27 26  23   Rhabdomyolysis -Primary cause of AKI.  CK level improved with IV fluid.   Recent Labs  Lab 01/15/21 0100 01/17/21 0141 01/18/21 0213 01/19/21 0158  CKTOTAL 2,328* 1,023* 611* 376    Uremic encephalopathy -Was combative on admission. Mental status improved with improvement in uremia. -Patient states he has history of major depression and anxiety.   -Currently he is on Prozac 40 mg daily, Seroquel 100 mg twice daily, BuSpar 10 mg twice daily, trazodone 50 mg nightly.  Discharged on the same.  Left ankle swelling -X-ray and MRI unremarkable.  Recommend Tylenol for pain control  Seizure -Generalized convulsion was noted by his roommate prior to presentation. -EEG unremarkable.  Neurology consult appreciated.  Currently on Vimpat 100 mg twice daily given renal failure and possibility of Keppra contributing to agitation.  Vimpat prescription called in by neurologist.  Discharge instruction for Seizure:  Per  [facility] statutes, patients with seizures are not allowed to drive until  they have been seizure-free for six months. Use caution when using heavy equipment or power tools. Avoid working on ladders or at heights. Take showers instead of baths. Ensure the water temperature is not too high on the home water heater. Do not go swimming alone. When  caring for infants or small children, sit down when holding, feeding, or changing them to minimize risk of injury to the child in the event you have a seizure.  Maintain good sleep hygiene. Avoid alcohol.  Aspiration pneumonia -Completed Zosyn -Isolated fever 12/27-has been stable, no associated leukocytosis -No recurrence of the fever  Acute liver injury -His AST and ALT were significantly elevated to over 700s on admission.  Gradually improved   Evidence of right ventricular dysfunction and recent echocardiogram -Echo 1/2 with EF 60 to 65%.  Cannot rule out a small PFO. -Not clinically significant as patient has no symptoms related to it. Continue to monitor as an outpatient.  Mobility: Encourage ambulation Living condition: Group home Goals of care:   Code Status: Full Code  Nutritional status: Body mass index is 25.95 kg/m.      Discharge Medications:   Allergies as of 01/19/2021   Not on File      Medication List     STOP taking these medications    gabapentin 400 MG capsule Commonly known as: NEURONTIN   hydrOXYzine 50 MG tablet Commonly known as: ATARAX       TAKE these medications    amLODipine 2.5 MG tablet Commonly known as: NORVASC Take 1 tablet (2.5 mg total) by mouth daily. Start taking on: January 20, 2021   busPIRone 10 MG tablet Commonly known as: BUSPAR Take 1 tablet (10 mg total) by mouth 2 (two) times daily.   FLUoxetine 20 MG capsule Commonly known as: PROZAC Take 2 capsules (40 mg total) by mouth daily.   Lacosamide 100 MG Tabs Commonly known as: Vimpat Take 1 tablet (100 mg total) by mouth 2 (two) times daily.   oxyCODONE-acetaminophen 5-325 MG tablet Commonly known as: Percocet Take 1 tablet by mouth every 6 (six) hours as needed for up to 5 days for severe pain.   QUEtiapine 100 MG tablet Commonly known as: SEROQUEL Take 1 tablet (100 mg total) by mouth 2 (two) times daily.   traZODone 50 MG tablet Commonly known as:  DESYREL Take 1 tablet (50 mg total) by mouth at bedtime as needed for sleep.               Discharge Care Instructions  (From admission, onward)           Start     Ordered   01/19/21 0000  Discharge wound care:        01/19/21 1443            Wound care:   Pressure Injury 01/10/21 Foot Left;Lower;Posterior Stage 1 -  Intact skin with non-blanchable redness of a localized area usually over a bony prominence. (Active)  Date First Assessed/Time First Assessed: 01/10/21 0000   Location: Foot  Location Orientation: Left;Lower;Posterior  Staging: Stage 1 -  Intact skin with non-blanchable redness of a localized area usually over a bony prominence.    Assessments 01/10/2021 12:00 AM 01/18/2021  8:32 PM  Dressing Type Foam - Lift dressing to assess site every shift None  Dressing Clean;Dry;Intact --  Dressing Change Frequency PRN --  State of Healing Epithelialized --  Drainage Amount None --  Treatment Cleansed;Off loading --     No Linked orders to display    Discharge Instructions:   Discharge Instructions     Call MD for:  difficulty breathing, headache or visual disturbances   Complete by: As directed    Call MD for:  extreme fatigue   Complete by: As directed    Call MD for:  hives   Complete by: As directed    Call MD for:  persistant dizziness or light-headedness   Complete by: As directed    Call MD for:  persistant nausea and vomiting   Complete by: As directed    Call MD for:  severe uncontrolled pain   Complete by: As directed    Call MD for:  temperature >100.4   Complete by: As directed    Diet general   Complete by: As directed    Discharge instructions   Complete by: As directed    Seizure precautions: -Per [facility] statutes, patients with seizures are not allowed to drive until they have been seizure-free for six months.  -Use caution when using heavy equipment or power tools.  -Avoid working on ladders or at heights.  -Take  showers instead of baths. Ensure the water temperature is not too high on the home water heater.  -Do not go swimming alone.  -Do not lock yourself in a room alone (i.e. bathroom). -When caring for infants or small children, sit down when holding, feeding, or changing them to minimize risk of injury to the child in the event you have a seizure.  -Maintain good sleep hygiene. -Avoid alcohol.    If patient has another seizure, call 911 and bring them back to the ED if: A.  The seizure lasts longer than 5 minutes.      B.  The patient doesn't wake shortly after the seizure or has new problems such as difficulty seeing, speaking or moving following the seizure C.  The patient was injured during the seizure D.  The patient has a temperature over 102 F (39C) E.  The patient vomited during the seizure and now is having trouble breathing   Discharge wound care:   Complete by: As directed    Increase activity slowly   Complete by: As directed        Follow ups:    Follow-up [place] Follow up.   Contact information: [place] [phone] .   Contact information: [address]     [address] [place] [phone]                Discharge Exam:   Vitals:   01/19/21 0001 01/19/21 0425 01/19/21 0733 01/19/21 1030  BP: (!) 142/91 (!) 155/95 140/88 (!) 140/91  Pulse: 83 75 70 67  Resp: 16 18 18    Temp: 98.5 F (36.9 C) 98.2 F (36.8 C) 98.2 F (36.8 C)   TempSrc: Oral Oral Oral   SpO2: 98% 99% 97% 100%  Weight:      Height:        Body mass index is 25.95 kg/m.    General exam: Pleasant, middle-aged Caucasian male.  Not in physical distress. Skin: No rashes, lesions or ulcers. HEENT: Atraumatic, normocephalic, no obvious bleeding Lungs: Clear to auscultation bilaterally CVS: Regular rate and  rhythm, no murmur  GI/Abd soft, nontender, nondistended, and bowel sound present  CNS: Alert, awake, oriented x3. Psychiatry: Mood appropriate Extremities: No pedal edema, no calf tenderness. Left ankle continues to be swollen, warm, tender.  Time coordinating discharge: 35 minutes   The results of significant diagnostics from this hospitalization (including imaging, microbiology, ancillary and laboratory) are listed below for reference.    Procedures and Diagnostic Studies:   CT HEAD WO CONTRAST (5MM)  Result Date: 01/05/2021 CLINICAL DATA:  Mental status change, unknown cause EXAM: CT HEAD WITHOUT CONTRAST TECHNIQUE: Contiguous axial images were obtained from the base of the skull through the vertex without intravenous contrast. COMPARISON:  None. FINDINGS: Brain: No evidence of acute infarction, hemorrhage, or hydrocephalus. Prominent fluid space at the left cerebellopontine angle measuring approximately 3 x 2 x 2 cm which appears to have mild mass effect upon the adjacent cerebellar hemisphere. Space appears to have internal density near that of CSF, although evaluation is slightly limited given artifact from the adjacent skull base. Vascular: No hyperdense vessel or unexpected calcification. Skull: Normal. Negative for fracture or focal lesion. Sinuses/Orbits: No acute finding. Other: None. IMPRESSION: 1. No acute intracranial findings. 2. 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 Correlation with prior outside imaging of the brain is recommended to assess stability. If no prior imaging is available, a follow-up contrast enhanced MRI could be performed on a nonemergent basis. Electronically Signed   By: Davina Poke D.O.   On: 01/05/2021 14:39   MR BRAIN WO CONTRAST  Result Date: 01/06/2021 CLINICAL DATA:  Altered mental status, found down EXAM: MRI HEAD WITHOUT CONTRAST TECHNIQUE: Multiplanar, multiecho  pulse sequences of the brain and surrounding structures were obtained without intravenous contrast. COMPARISON:  CT head obtained 1 day prior FINDINGS: Brain: There is a punctate focus of diffusion restriction in the right lentiform nucleus with associated T2/FLAIR signal abnormality consistent with a tiny acute infarct. There is no other evidence of acute infarct. There is no acute intracranial hemorrhage or extra-axial fluid collection. Parenchymal volume is normal. The ventricles are normal in size. There are scattered small foci of FLAIR signal abnormality in the subcortical and periventricular white matter likely reflecting mild chronic white matter microangiopathy. 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 There is no solid mass lesion. There is no solid mass lesion or evidence of epidermoid cyst in the prominent CSF space in the left cerebellopontine angle, suggesting a benign arachnoid cyst. There is no significant mass effect on the cerebellum. There is no midline shift. Vascular: Normal flow voids. Skull and upper cervical spine: Normal marrow signal. Sinuses/Orbits: There is mild mucosal thickening in the paranasal sinuses. The globes and orbits are unremarkable. Other: None. IMPRESSION: 1. 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 2. Mild chronic white matter microangiopathy. 3. 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 Electronically Signed   By: Valetta Mole M.D.   On: 01/06/2021 15:23   US RENAL  Result Date: 01/06/2021 CLINICAL DATA:  Acute kidney injury EXAM: RENAL / URINARY TRACT ULTRASOUND COMPLETE COMPARISON:  None. FINDINGS: Right Kidney: Renal measurements: 10.5 x 6.4 x 5.8 cm = volume: 204 mL. Echogenicity within normal limits. No mass or hydronephrosis visualized. Left Kidney: Renal measurements: 11.0 x 7.1 x 6.4 cm = volume: 263 mL. Echogenicity within normal limits. No mass or  hydronephrosis visualized. Bladder: Appears normal for degree of bladder  distention. Other: None. IMPRESSION: No acute findings.  No hydronephrosis. Electronically Signed   By: Rolm Baptise M.D.   On: 01/06/2021 12:20   DG Chest Port 1 View  Result Date: 01/06/2021 CLINICAL DATA:  Pneumonia EXAM: PORTABLE CHEST 1 VIEW COMPARISON:  01/05/2021 FINDINGS: Heart and mediastinal contours are within normal limits. No focal opacities or effusions. No acute bony abnormality. IMPRESSION: No active disease. Electronically Signed   By: Rolm Baptise M.D.   On: 01/06/2021 12:16   DG Chest Port 1 View  Result Date: 01/05/2021 CLINICAL DATA:  Questionable sepsis EXAM: PORTABLE CHEST 1 VIEW COMPARISON:  None. FINDINGS: Normal heart size. Normal mediastinal contour. No pneumothorax. No pleural effusion. No overt pulmonary edema. Mild hazy bibasilar lung opacities. IMPRESSION: Mild hazy bibasilar lung opacities, which could represent aspiration or atelectasis. Electronically Signed   By: Ilona Sorrel M.D.   On: 01/05/2021 14:57   DG Abd Portable 1V  Result Date: 01/06/2021 CLINICAL DATA:  Pneumonia, abdominal pain EXAM: PORTABLE ABDOMEN - 1 VIEW COMPARISON:  None. FINDINGS: The bowel gas pattern is normal. No radio-opaque calculi or other significant radiographic abnormality are seen. IMPRESSION: Negative. Electronically Signed   By: Rolm Baptise M.D.   On: 01/06/2021 12:17   EEG adult  Result Date: 01/06/2021 Lora Havens, MD     01/06/2021  9:12 AM Patient Name: Steve [name] MRN: [id number] Epilepsy Attending: Lora Havens Referring Physician/Provider: Salvadore Dom, NP Date: 01/06/2021 Duration: 22.57 mins Patient history: 56 yo male who presents from a group home after witnessed generalized seizure activity with combativeness afterward.  EEG to evaluate for seizure. Level of alertness: Awake AEDs during EEG study: LEV Technical aspects: This EEG study was done with scalp electrodes positioned according  to the 10-20 International system of electrode placement. Electrical activity was acquired at a sampling rate of 500Hz  and reviewed with a high frequency filter of 70Hz  and a low frequency filter of 1Hz . EEG data were recorded continuously and digitally stored. Description: No posterior dominant rhythm was seen.  EEG showed continuous generalized 3 to 5 Hz theta-delta slowing.  Hyperventilation and photic stimulation were not performed.   ABNORMALITY - Continuous slow, generalized IMPRESSION: This study is suggestive of moderate diffuse encephalopathy, nonspecific etiology. No seizures or epileptiform discharges were seen throughout the recording. Lora Havens   ECHOCARDIOGRAM COMPLETE  Result Date: 01/06/2021    ECHOCARDIOGRAM REPORT   Patient Name:   Steve [name] Date of Exam: 01/06/2021 Medical Rec #:  [id number]    Height:       68.0 in Accession #:    [id number]   Weight:       148.8 lb Date of Birth:  Jun 15, 1965   BSA:          1.802 m Patient Age:    7 years     BP:           118/79 mmHg Patient Gender: M            HR:           61 bpm. Exam Location:  Inpatient Procedure: 2D Echo Indications:    bacterial meningitis  History:        Patient has no prior history of Echocardiogram examinations.  Sonographer:    Johny Chess RDCS Referring Phys: [id number] [place]  Sonographer Comments: Image acquisition challenging due to respiratory motion. IMPRESSIONS  1. Left ventricular ejection fraction, by estimation, is 55 to 60%. The left ventricle has normal  function. The left ventricle has no regional wall motion abnormalities. There is mild concentric left ventricular hypertrophy. Left ventricular diastolic parameters were normal. There is the interventricular septum is flattened in diastole ('D' shaped left ventricle), consistent with right ventricular volume overload.  2. Right ventricular systolic function is moderately reduced. The right ventricular size is moderately enlarged. There is  normal pulmonary artery systolic pressure.  3. The mitral valve is normal in structure. Trivial mitral valve regurgitation.  4. Tricuspid valve regurgitation is moderate.  5. The aortic valve is tricuspid. Aortic valve regurgitation is not visualized. No aortic stenosis is present.  6. There is rapid and restrictive y descent in the hepatic veins and there is diastolic flow reversal (taht may be occurring in expiration) - consider constrictive physiology. The inferior vena cava is dilated in size with <50% respiratory variability, suggesting right atrial pressure of 15 mmHg.  7. Evidence of atrial level shunting detected by color flow Doppler. There is a small secundum atrial septal defect with (probably) bidirectional shunting across the atrial septum. Conclusion(s)/Recommendation(s): Consider cardiac CT or MRI if there is a suspicion for constrictive pericarditis. Consider TEE to evaluate for ASD, but neither of these issues is likely to be an urgent clinical question. FINDINGS  Left Ventricle: There is prominent respiratory displacement of the ventricular septum with respiration, suggesting possible constrictive physiology, but this can also be seen with increased work of breathing (asthma or COPD exacerbation) or the Mueller maneuver (grunting, straining). Left ventricular ejection fraction, by estimation, is 55 to 60%. The left ventricle has normal function. The left ventricle has no regional wall motion abnormalities. The left ventricular internal cavity size was normal in  size. There is mild concentric left ventricular hypertrophy. The interventricular septum is flattened in diastole ('D' shaped left ventricle), consistent with right ventricular volume overload. Left ventricular diastolic parameters were normal. Normal left ventricular filling pressure. Right Ventricle: The right ventricular size is moderately enlarged. No increase in right ventricular wall thickness. Right ventricular systolic function is  moderately reduced. There is normal pulmonary artery systolic pressure. The tricuspid regurgitant velocity is 2.06 m/s, and with an assumed right atrial pressure of 15 mmHg, the estimated right ventricular systolic pressure is [id number]. Left Atrium: Left atrial size was normal in size. Right Atrium: Right atrial size was normal in size. Pericardium: There is no evidence of pericardial effusion. Mitral Valve: The mitral valve is normal in structure. Trivial mitral valve regurgitation. Tricuspid Valve: The tricuspid valve is normal in structure. Tricuspid valve regurgitation is moderate. Aortic Valve: The aortic valve is tricuspid. Aortic valve regurgitation is not visualized. No aortic stenosis is present. Pulmonic Valve: The pulmonic valve was normal in structure. Pulmonic valve regurgitation is not visualized. Aorta: The aortic root and ascending aorta are structurally normal, with no evidence of dilitation. Venous: There is rapid and restrictive y descent in the hepatic veins and there is diastolic flow reversal (taht may be occurring in expiration) - consider constrictive physiology. The inferior vena cava is dilated in size with less than 50% respiratory variability, suggesting right atrial pressure of 15 mmHg. IAS/Shunts: Evidence of atrial level shunting detected by color flow Doppler. There is a small secundum atrial septal defect with bidirectional shunting across the atrial septum.  LEFT VENTRICLE PLAX 2D LVIDd:         4.60 cm     Diastology LVIDs:         3.50 cm     LV e' medial:    7.07  cm/s LV PW:         1.20 cm     LV E/e' medial:  10.3 LV IVS:        1.20 cm     LV e' lateral:   8.05 cm/s LVOT diam:     2.10 cm     LV E/e' lateral: 9.0 LV SV:         60 LV SV Index:   33 LVOT Area:     3.46 cm  LV Volumes (MOD) LV vol d, MOD A2C: 88.3 ml LV vol d, MOD A4C: 88.3 ml LV vol s, MOD A2C: 51.4 ml LV vol s, MOD A4C: 48.8 ml LV SV MOD A2C:     36.9 ml LV SV MOD A4C:     88.3 ml LV SV MOD BP:      39.4 ml  RIGHT VENTRICLE            IVC RV S prime:     8.49 cm/s  IVC diam: 2.50 cm TAPSE (M-mode): 1.7 cm LEFT ATRIUM             Index        RIGHT ATRIUM           Index LA diam:        3.40 cm 1.89 cm/m   RA Area:     16.20 cm LA Vol (A2C):   50.6 ml 28.07 ml/m  RA Volume:   39.10 ml  21.69 ml/m LA Vol (A4C):   52.8 ml 29.29 ml/m LA Biplane Vol: 54.2 ml 30.07 ml/m  AORTIC VALVE LVOT Vmax:   80.10 cm/s LVOT Vmean:  51.400 cm/s LVOT VTI:    0.172 m  AORTA Ao Root diam: 3.10 cm Ao Asc diam:  3.80 cm MITRAL VALVE               TRICUSPID VALVE MV Area (PHT): 3.60 cm    TR Peak grad:   17.0 mmHg MV Decel Time: 211 msec    TR Vmax:        206.00 cm/s MV E velocity: 72.80 cm/s MV A velocity: 61.70 cm/s  SHUNTS MV E/A ratio:  1.18        Systemic VTI:  0.17 m                            Systemic Diam: 2.10 cm Mihai Croitoru MD Electronically signed by Sanda Klein MD Signature Date/Time: 01/06/2021/5:34:22 PM    Final      Labs:   Basic Metabolic Panel: Recent Labs  Lab 01/13/21 0241 01/14/21 0207 01/15/21 0100 01/15/21 [id number] 01/16/21 0141 01/17/21 0141 01/18/21 0213 01/19/21 0158  NA 138 137 140  --  143 142 142 141  K 3.5 3.2* 3.2*  --  3.4* 3.5 3.8 3.5  CL 96* 93* 100  --  104 108 108 112*  CO2 31 29 29   --  28 27 26 23   GLUCOSE 130* 100* 106*  --  102* 98 109* 83  BUN 70* 62* 54*  --  39* 27* 19 16  CREATININE 7.03* 6.31* 5.38*  --  4.35* 2.98* 2.17* 1.60*  CALCIUM 6.7* 6.9* 6.9*  --  7.6* 7.8* 8.2* 7.8*  MG  --   --   --  1.5*  --  2.3  --   --   PHOS 4.8* 5.4* 4.5  --  3.5 3.6  --   --  GFR Estimated Creatinine Clearance: 50.5 mL/min (A) (by C-G formula based on SCr of 1.6 mg/dL (H)). Liver Function Tests: Recent Labs  Lab 01/13/21 0241 01/14/21 0207 01/15/21 0100 01/16/21 0141  AST  --   --  91*  --   ALT  --   --  137*  --   ALKPHOS  --   --  48  --   BILITOT  --   --  0.6  --   PROT  --   --  5.4*  --   ALBUMIN 2.3* 2.2* 2.3*   2.2* 2.6*   No results for input(s):  LIPASE, AMYLASE in the last 168 hours. Recent Labs  Lab 01/17/21 0141  AMMONIA 28   Coagulation profile Recent Labs  Lab 01/16/21 0141  INR 1.0    CBC: Recent Labs  Lab 01/13/21 0248 01/14/21 0207 01/15/21 0100 01/16/21 0141 01/17/21 0141 01/18/21 0213 01/19/21 0158  WBC 9.9   < > 7.8 8.6 6.9 6.6 6.3  NEUTROABS 6.4  --   --   --  3.9 3.3 2.5  HGB 10.4*   < > 10.6* 10.0* 9.5* 9.5* 9.3*  HCT 29.3*   < > 30.0* 30.2* 27.8* 28.0* 27.1*  MCV 93.3   < > 93.5 96.5 95.2 96.9 95.8  PLT 158   < > 203 252 262 301 325   < > = values in this interval not displayed.   Cardiac Enzymes: Recent Labs  Lab 01/15/21 0100 01/17/21 0141 01/18/21 0213 01/19/21 0158  CKTOTAL 2,328* 1,023* 611* 376   BNP: Invalid input(s): POCBNP CBG: Recent Labs  Lab 01/15/21 0320 01/15/21 0713 01/15/21 1112 01/15/21 1526 01/15/21 1923  GLUCAP 127* 107* 141* 161* 104*   D-Dimer No results for input(s): DDIMER in the last 72 hours. Hgb A1c No results for input(s): HGBA1C in the last 72 hours. Lipid Profile No results for input(s): CHOL, HDL, LDLCALC, TRIG, CHOLHDL, LDLDIRECT in the last 72 hours. Thyroid function studies No results for input(s): TSH, T4TOTAL, T3FREE, THYROIDAB in the last 72 hours.  Invalid input(s): FREET3 Anemia work up No results for input(s): VITAMINB12, FOLATE, FERRITIN, TIBC, IRON, RETICCTPCT in the last 72 hours. Microbiology No results found for this or any previous visit (from the past 240 hour(s)).   Signed: Terrilee Croak  [facility] 01/19/2021, 2:43 PM

## 2021-01-19 NOTE — Progress Notes (Signed)
Orthopedic Tech Progress Note Patient Details:  Steve [name] 03/21/1965 [id number]  Ortho Devices Type of Ortho Device: CAM walker Ortho Device/Splint Location: LLE Ortho Device/Splint Interventions: Ordered, Application, Adjustment   Post Interventions Patient Tolerated: Fair, Well Instructions Provided: Care of device  [name] 01/19/2021, 6:53 PM

## 2021-01-20 ENCOUNTER — Inpatient Hospital Stay (HOSPITAL_COMMUNITY): Payer: Medicaid Other

## 2021-01-20 IMAGING — MR MR LUMBAR SPINE W/O CM
4 series · 18 of 48 positions shown · non-contrast
Comparison: None available.

CLINICAL DATA: Initial evaluation for left lower extremity
paresthesias, low back pain.

EXAM:
MRI LUMBAR SPINE WITHOUT CONTRAST
TECHNIQUE: Multiplanar, multisequence MR imaging of the lumbar spine was
performed. No intravenous contrast was administered.

[Series 3: T2 · sagittal · 4.0mm · 0.55mm/px · 9 of 18 slices shown]
[im 1/18]
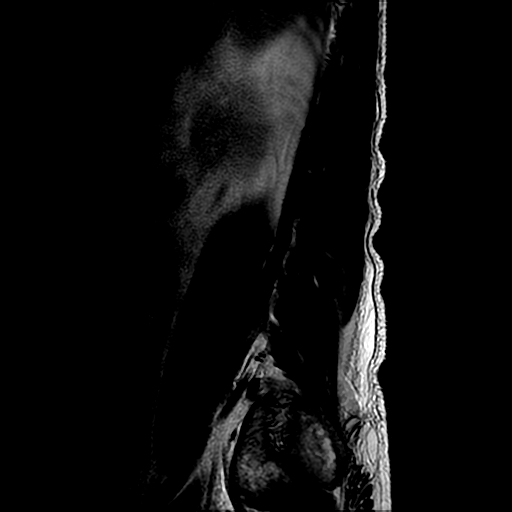
[im 4/18]
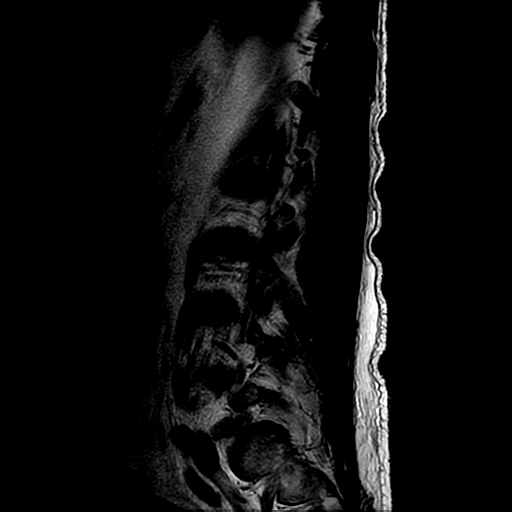
[im 5/18]
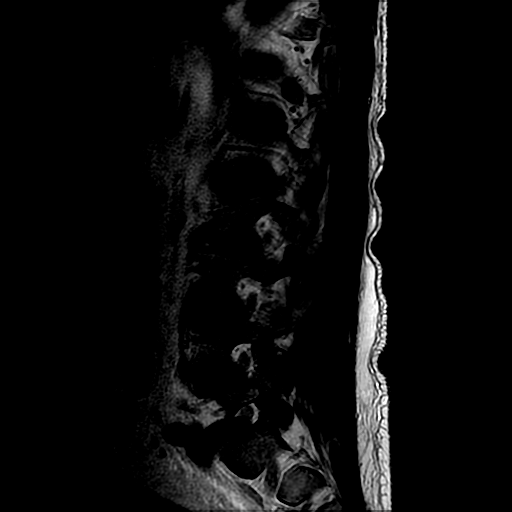
[im 8/18]
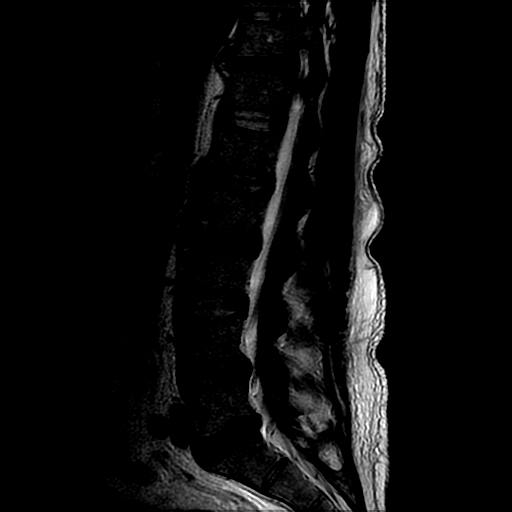
[im 10/18]
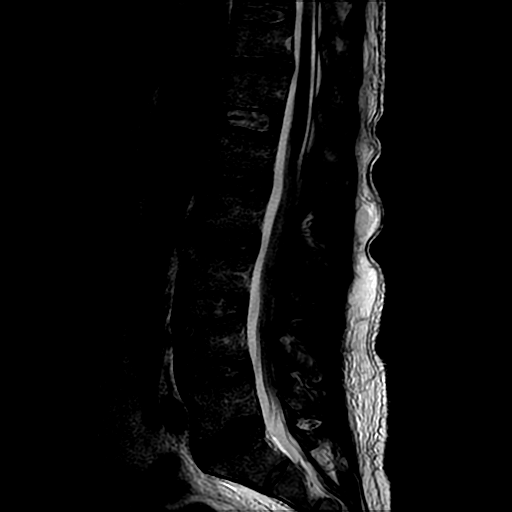
[im 13/18]
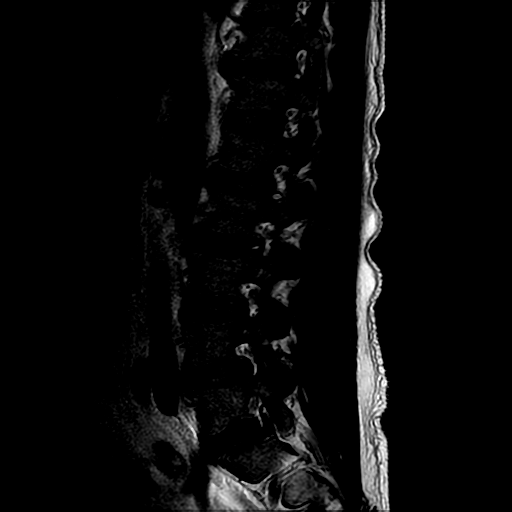
[im 14/18]
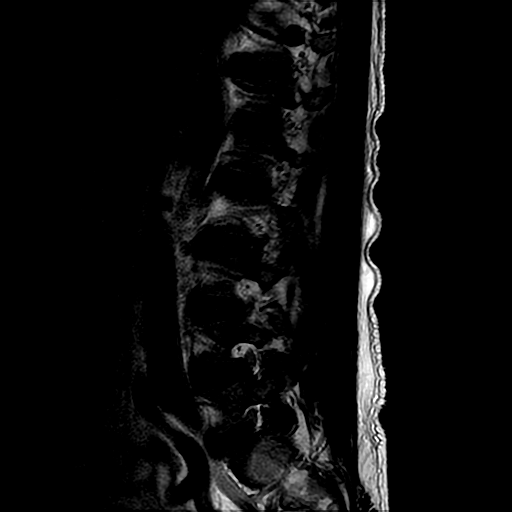
[im 16/18]
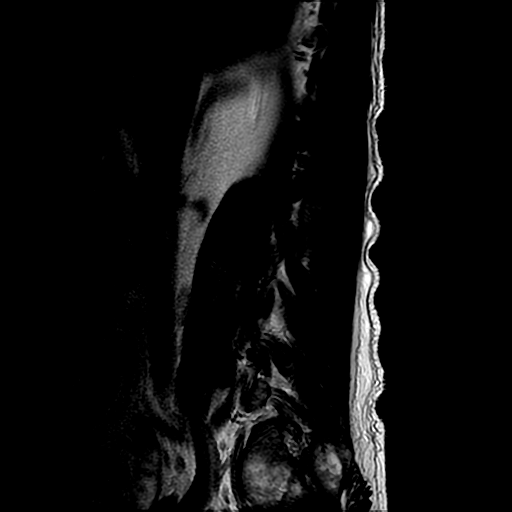
[im 18/18]
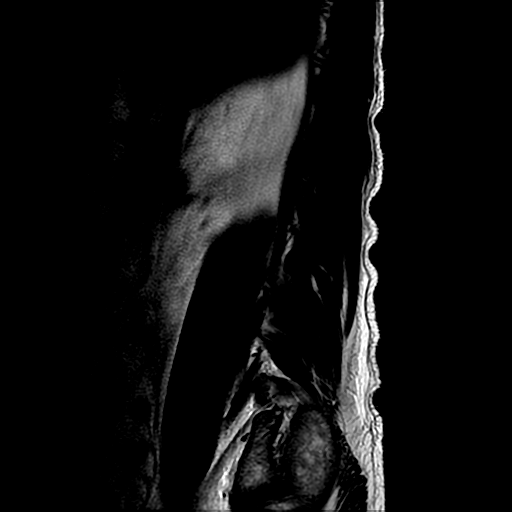

[Series 4: T1 · sagittal · 4.0mm · 0.55mm/px · 3 of 18 slices shown]
[im 4/18]
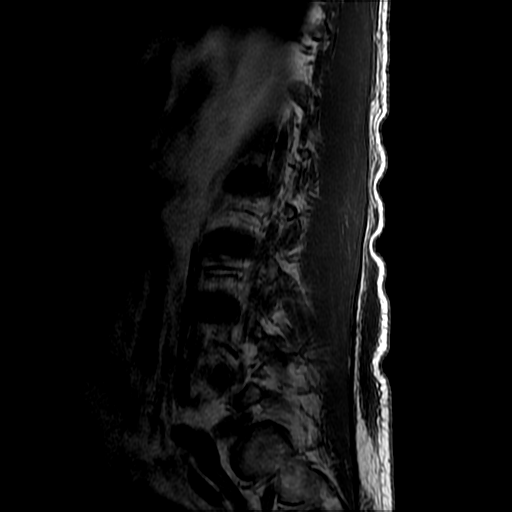
[im 10/18]
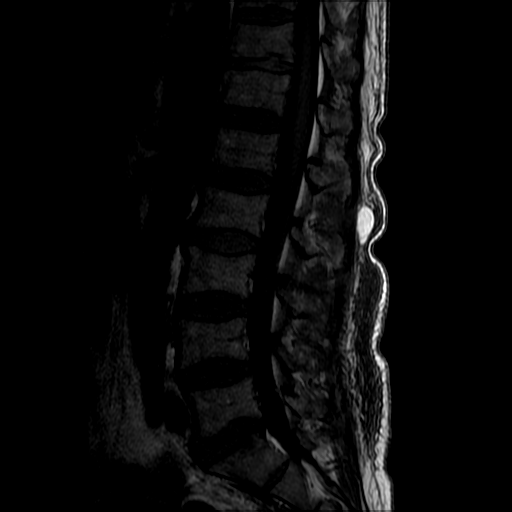
[im 16/18]
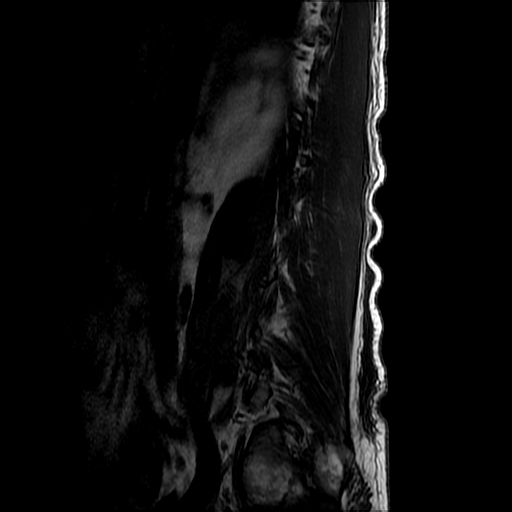

[Series 5: sag ir · sagittal · 4.0mm · 0.55mm/px · 3 of 18 slices shown]
[im 4/18]
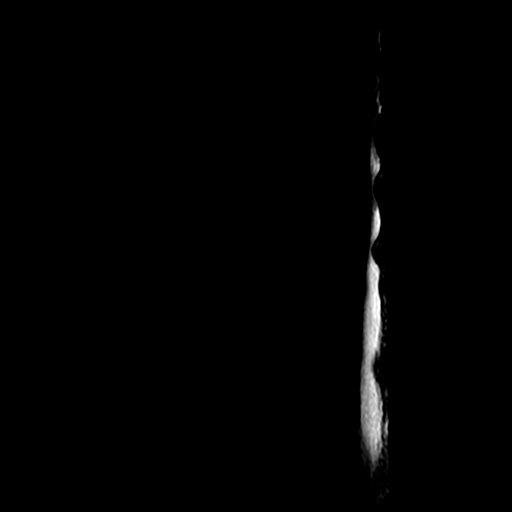
[im 10/18]
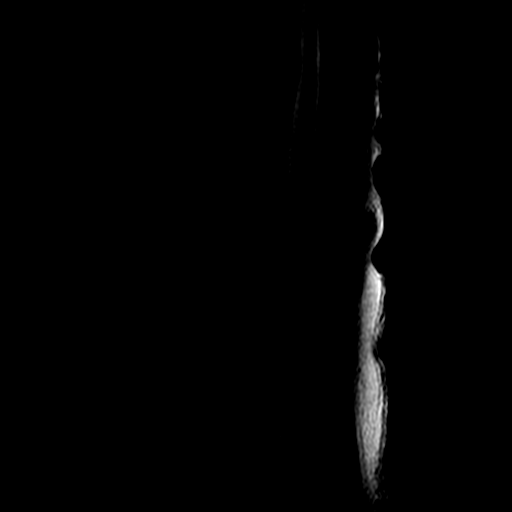
[im 16/18]
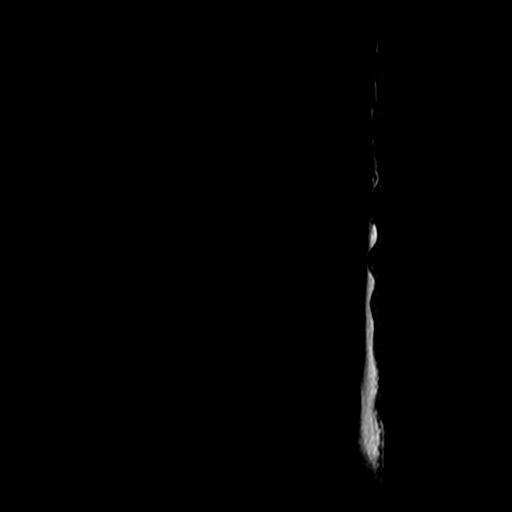

[Series 6: sag ir repeat · sagittal · 4.0mm · 0.55mm/px · 3 of 18 slices shown]
[im 4/18]
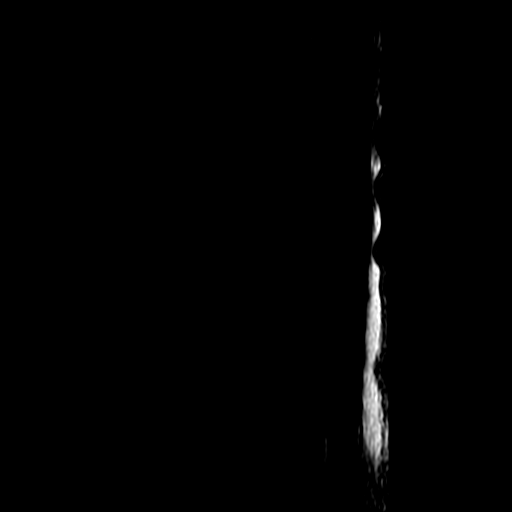
[im 10/18]
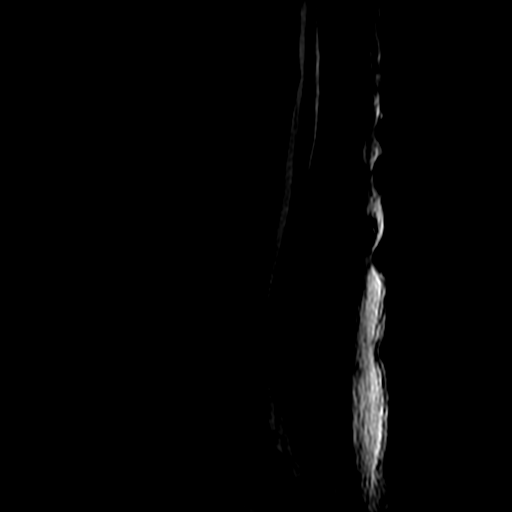
[im 16/18]
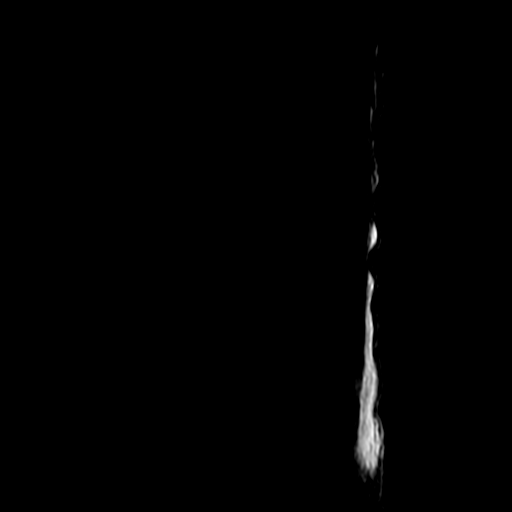

[18 of 48 positions shown; findings below may reference images not displayed]

FINDINGS: Segmentation: Examination technically limited as the patient was
unable to tolerate the full length of the exam. Sagittal T1, T2, and
STIR sequences only were performed, with no axial imaging.
Additionally, provided images are degraded by motion.

Standard segmentation. Lowest well-formed disc space labeled the
L5-S1 level.

Alignment: Trace retrolisthesis of L5 on S1. Alignment otherwise
normal with preservation of the normal lumbar lordosis.

Vertebrae: Vertebral body height maintained with no visible acute or
chronic fracture. Bone marrow signal intensity within normal limits.
No visible discrete or worrisome osseous lesions. No definite
abnormal marrow edema.

Conus medullaris and cauda equina: Conus extends to the L1 level.
Conus and cauda equina appear normal.

Paraspinal and other soft tissues: Visualized paraspinous soft
tissues within normal limits on this limited exam.

Disc levels:

T11-12: Disc desiccation with mild disc bulge.  No stenosis.

T12-L1: Unremarkable.

L1-2: Disc desiccation without significant disc bulge. No spinal
stenosis. Foramina appear patent.

L2-3: Disc desiccation with minimal disc bulge. There is a small
foraminal disc protrusion with annular fissure, closely
approximating the exiting L2 nerve root (series 3, image 6). Unclear
which side this is given the lack axial imaging or other visible
landmarks. Mild foraminal narrowing present on this side. No spinal
stenosis.

L3-4: Negative interspace. No spinal stenosis. Foramina appear
patent.

L4-5: Mild disc desiccation with annular disc bulge. Small foraminal
disc protrusion with annular fissure (series 3, image 6). Again,
unclear which side this reflects. Associated mild foraminal
narrowing. Superimposed bilateral facet hypertrophy. No spinal
stenosis.

L5-S1: Trace retrolisthesis with mild disc bulge and disc
desiccation. No spinal stenosis. Foramina appear patent.
IMPRESSION: 1. Technically limited exam due to the patient's inability to
tolerate the full length of the study as well as motion artifact. No
axial sequences were performed.
2. Small foraminal disc protrusions at L2-3 and L4-5, closely
approximating and potentially irritating the exiting L2 and L4 nerve
roots respectively. The laterality of these findings is unclear
given the lack of axial images or other visible landmarks.
Correlation with physical exam for possible L2 or L4 nerve root
symptoms involving either lower extremity recommended.
3. Additional mild noncompressive disc bulging at T11-12, L2-3, and
L5-S1 without significant stenosis or impingement.

## 2021-01-20 MED ORDER — TRAZODONE HCL 50 MG PO TABS
50.0000 mg | ORAL_TABLET | Freq: Every day | ORAL | Status: DC
  Administered 2021-01-20 – 2021-02-04 (×16): 50 mg via ORAL
  Filled 2021-01-20 (×16): qty 1

## 2021-01-20 NOTE — Progress Notes (Addendum)
Pt becoming increasingly confused , has put his pants on and states that he wants to leave.  Pt has pulled telemetry off and not willing to put back on at this time. He states that he needs to go home to go to [employer]. Previously he asked to go to the 5th floor to take a shower. Pt currently verbally redirectable. Provider Howerter notified and aware. Morning dose of oxy not given due to pt's increased confusion. Morphine was last given at 0300 , change in behavior was noted after the morphine. Bed alarm is set and call bell is within reach will continue to monitor.

## 2021-01-20 NOTE — Progress Notes (Signed)
[facility] night cross cover note:  I was notified by RN that the patient is confused, mildly agitated, with appearance of potential worsening of confusion following dose of prn oxycodone earlier in the shift.  At this time, the patient is verbally redirectable.  will continue to monitor.   Babs Bertin, DO Hospitalist

## 2021-01-20 NOTE — Progress Notes (Signed)
Physical Therapy Treatment Patient Details Name: Steve [name] MRN: [id number] DOB: 08-Oct-1965 Today's Date: 01/20/2021   History of Present Illness Pt is a 56 y/o male who presented on 01/05/21 from his group home (drug/alcohol rehab?) s/p witnessed seizure and AMS. He was found to have AKI and rhabdomyolysis. Pt with c/o L ankle pain since 01/16/21 - imaging negative, ortho consulted.   No PMH noted in the chart.    PT Comments    PT checked on pt at 15:00, he requested PT return at 16:00.  PT returned at 15:55 and pt requested waiting till 16:00.  PT agreed.  PT returned 16:00 and pt found in the shower.  Pt had turned off bed alarm and ambulated to shower without AD.  Nursing had also caught pt ambulating in room earlier without AD with alarm going off.  RN in and assisting.  Once pt done showering, began therapy.  Tried ambulation with RW and pt not weight bearing on L foot due to pain but did hop 50'.  Asked how he got to shower if too painful to weight bear and he said "Magda Paganini, a friend" helped him - there was no one in room.  Pt with decreased safety awareness. Pt with varied performance with mobility depending on situation- continue to recommend HHPT and use of RW or w/c.   Per ortho note in regards to L ankle pain: "Left ankle pain -- This is somewhat of an enigma. With normal (except for SQ edema) MRI and excellent ankle motion infectious or inflammatory arthropathies are essentially excluded as are traumatic injuries. Given his paresthesias and possible calf atrophy we should probably r/o radicular etiology related to L5/S1 and I will order MRI. The swelling would be a rare presentation of this however. Given the rash (he denies any excess scratching, etc) I also worry about a vasculitis and would recommend GS consultation for punch bx. CRPS is also a possibility though doesn't really fit with the rash. CAM boot seems unhelpful for symptomatology so would discontinue this modality. "   Recommendations for follow up therapy are one component of a multi-disciplinary discharge planning process, led by the attending physician.  Recommendations may be updated based on patient status, additional functional criteria and insurance authorization.  Follow Up Recommendations  Home health PT     Assistance Recommended at Discharge Intermittent Supervision/Assistance  Patient can return home with the following Help with stairs or ramp for entrance;Assist for transportation;Assistance with cooking/housework;A little help with walking and/or transfers;A little help with bathing/dressing/bathroom   Equipment Recommendations  Rolling walker (2 wheels);Wheelchair (measurements PT)    Recommendations for Other Services       Precautions / Restrictions Precautions Precautions: Fall Precaution Comments: L foot pain Restrictions LLE Weight Bearing: Weight bearing as tolerated Other Position/Activity Restrictions: CAM boot not needed per ortho note     Mobility  Bed Mobility Overal bed mobility: Modified Independent Bed Mobility: Supine to Sit;Sit to Supine     Supine to sit: Modified independent (Device/Increase time) Sit to supine: Modified independent (Device/Increase time)   General bed mobility comments: Pt had turned off bed alarm and was in shower at arrival    Transfers Overall transfer level: Needs assistance Equipment used: Rolling walker (2 wheels) Transfers: Sit to/from Stand Sit to Stand: Min guard           General transfer comment: Min guard for safety during session but pt has been up twice without assist and without RW (nurse caught pt walking  to door earlier with alarm going off, and at arrival pt had turned off alarm and walked to shower)    Ambulation/Gait Ambulation/Gait assistance: Min guard Gait Distance (Feet): 50 Feet Assistive device: Rolling walker (2 wheels)   Gait velocity: decreased     General Gait Details: Pt hopping on R foot  with standing rest breaks.  Refusing to put L foot on ground even for balance due to pain.  However, pt had ambulated to door earlier without AD and ambulated to shower without AD earlier   Stairs             Wheelchair Mobility    Modified Rankin (Stroke Patients Only)       Balance Overall balance assessment: Needs assistance Sitting-balance support: Feet supported;No upper extremity supported Sitting balance-Leahy Scale: Normal     Standing balance support: Bilateral upper extremity supported;No upper extremity supported Standing balance-Leahy Scale: Fair Standing balance comment: RW for mobility during therapy but could static stand no AD; RN reports pt had 2 LOB getting out of shower                            Cognition Arousal/Alertness: Awake/alert Behavior During Therapy: Anxious;Impulsive Overall Cognitive Status: No family/caregiver present to determine baseline cognitive functioning Area of Impairment: Problem solving;Safety/judgement                         Safety/Judgement: Decreased awareness of safety   Problem Solving: Requires verbal cues;Requires tactile cues;Slow processing General Comments: Pt turned off bed alarm and in shower at arrival.        Exercises      General Comments        Pertinent Vitals/Pain Pain Assessment: Faces Faces Pain Scale: Hurts even more Pain Location: L foot/ankle (lower calf, down behind ankle, heel, under foot to toes) Pain Descriptors / Indicators: Guarding;Discomfort;Grimacing Pain Intervention(s): Limited activity within patient's tolerance;Monitored during session    Home Living                          Prior Function            PT Goals (current goals can now be found in the care plan section) Progress towards PT goals: Progressing toward goals    Frequency    Min 3X/week      PT Plan Current plan remains appropriate    Co-evaluation               AM-PAC PT "6 Clicks" Mobility   Outcome Measure  Help needed turning from your back to your side while in a flat bed without using bedrails?: None Help needed moving from lying on your back to sitting on the side of a flat bed without using bedrails?: None Help needed moving to and from a bed to a chair (including a wheelchair)?: A Little Help needed standing up from a chair using your arms (e.g., wheelchair or bedside chair)?: A Little Help needed to walk in hospital room?: A Little Help needed climbing 3-5 steps with a railing? : A Lot 6 Click Score: 19    End of Session Equipment Utilized During Treatment: Gait belt Activity Tolerance: Patient limited by pain Patient left: in bed;with call bell/phone within reach;with bed alarm set Nurse Communication: Mobility status PT Visit Diagnosis: Unsteadiness on feet (R26.81);Pain Pain - Right/Left: Left Pain - part of body: Ankle  and joints of foot     Time: [id number] PT Time Calculation (min) (ACUTE ONLY): 25 min  Charges:  $Gait Training: 8-22 mins $Therapeutic Activity: 8-22 mins                     Abran Richard, PT Acute Rehab Services Pager [phone] [facility] [place] 01/20/2021, 4:38 PM

## 2021-01-20 NOTE — Consult Note (Addendum)
Reason for Consult:Left ankle pain Referring Physician: Marlowe Aschoff [place] Time called: [id number] Time at bedside: Steve [name] is an 56 y.o. male.  HPI: [name] was admitted 2 weeks ago after being found down and seizing at the group home where he resides. He reports that at the time of admission he noted left ankle pain. This was mild and intermittent at first but has gradually worsened to become constant and severe. He is unable to bear weight. This has also been associated with ankle edema and he notes loss of musculature in his calf. He denies any prior hx/o similar or gout. He was first noted to have LE pain on his initial PT evaluation 12/31. X-rays and MRI have failed to find any pathology beyond soft tissue edema about the ankle and orthopedic surgery was consulted.  History reviewed. No pertinent past medical history.  History reviewed. No pertinent surgical history.  History reviewed. No pertinent family history.  Social History:  reports that he has been smoking cigarettes. He has been smoking an average of .5 packs per day. He uses smokeless tobacco. No history on file for alcohol use and drug use.  Allergies: Not on File  Medications: I have reviewed the patient's current medications.  Results for orders placed or performed during the hospital encounter of 01/05/21 (from the past 48 hour(s))  CBC with Differential/Platelet     Status: Abnormal   Collection Time: 01/19/21  1:58 AM  Result Value Ref Range   WBC 6.3 4.0 - 10.5 K/uL   RBC 2.83 (L) 4.22 - 5.81 MIL/uL   Hemoglobin 9.3 (L) 13.0 - 17.0 g/dL   HCT 27.1 (L) 39.0 - 52.0 %   MCV 95.8 80.0 - 100.0 fL   MCH 32.9 26.0 - 34.0 pg   MCHC 34.3 30.0 - 36.0 g/dL   RDW 14.2 11.5 - 15.5 %   Platelets 325 150 - 400 K/uL   nRBC 0.0 0.0 - 0.2 %   Neutrophils Relative % 40 %   Neutro Abs 2.5 1.7 - 7.7 K/uL   Lymphocytes Relative 40 %   Lymphs Abs 2.5 0.7 - 4.0 K/uL   Monocytes Relative 15 %   Monocytes Absolute 0.9 0.1 - 1.0  K/uL   Eosinophils Relative 4 %   Eosinophils Absolute 0.3 0.0 - 0.5 K/uL   Basophils Relative 1 %   Basophils Absolute 0.1 0.0 - 0.1 K/uL   Immature Granulocytes 0 %   Abs Immature Granulocytes 0.02 0.00 - 0.07 K/uL    Comment: Performed at [facility], [address] [place], [place] [id number]  Basic metabolic panel     Status: Abnormal   Collection Time: 01/19/21  1:58 AM  Result Value Ref Range   Sodium 141 135 - 145 mmol/L   Potassium 3.5 3.5 - 5.1 mmol/L   Chloride 112 (H) 98 - 111 mmol/L   CO2 23 22 - 32 mmol/L   Glucose, Bld 83 70 - 99 mg/dL    Comment: Glucose reference range applies only to samples taken after fasting for at least 8 hours.   BUN 16 6 - 20 mg/dL   Creatinine, Ser 1.60 (H) 0.61 - 1.24 mg/dL   Calcium 7.8 (L) 8.9 - 10.3 mg/dL   GFR, Estimated 51 (L) >60 mL/min    Comment: (NOTE) Calculated using the CKD-EPI Creatinine Equation (2021)    Anion gap 6 5 - 15    Comment: Performed at [facility]  [address], [place], [place] [address]  CK     Status: None   Collection Time: 01/19/21  1:58 AM  Result Value Ref Range   Total CK 376 49 - 397 U/L    Comment: Performed at [facility], [place] [address], [place],  [address]    MR ANKLE LEFT WO CONTRAST  Result Date: 01/19/2021 CLINICAL DATA:  Ankle pain and swelling EXAM: MRI OF THE LEFT ANKLE WITHOUT CONTRAST TECHNIQUE: Multiplanar, multisequence MR imaging of the ankle was performed. No intravenous contrast was administered. COMPARISON:  Ankle x-ray 01/17/2021 FINDINGS: TENDONS Peroneal: Intact peroneus longus and peroneus brevis tendons. Posteromedial: Intact tibialis posterior, flexor hallucis longus and flexor digitorum longus tendons. Anterior: Intact tibialis anterior, extensor hallucis longus and extensor digitorum longus tendons. Achilles: Intact. Plantar Fascia: Intact. LIGAMENTS Lateral: Intact. Medial: Intact. CARTILAGE Ankle Joint: No joint effusion or chondral defect.  Subtalar Joints/Sinus Tarsi: No joint effusion or chondral defect. Bones: No marrow signal abnormality. No fracture or dislocation. Other: Diffuse subcutaneous soft tissue edema throughout the ankle most prominent posteriorly and in the visualized dorsal lateral foot. IMPRESSION: 1. No acute osseous abnormality or internal derangement in the ankle. 2. Diffuse subcutaneous soft tissue swelling. Electronically Signed   By: Ofilia Neas M.D.   On: 01/19/2021 08:12   ECHOCARDIOGRAM COMPLETE  Result Date: 01/18/2021    ECHOCARDIOGRAM REPORT   Patient Name:   [name] Date of Exam: 01/18/2021 Medical Rec #:  [id number]    Height:       68.0 in Accession #:    [id number]   Weight:       170.6 lb Date of Birth:  [birth year]   BSA:          1.910 m Patient Age:    66 years     BP:           143/90 mmHg Patient Gender: M            HR:           69 bpm. Exam Location:  Inpatient Procedure: 2D Echo, Color Doppler and Cardiac Doppler Indications:    Abnormal EKG R94.31  History:        Patient has prior history of Echocardiogram examinations, most                 recent 01/06/2021. Severe sepsis without septic shock ([place]),                 Fever.  Sonographer:    Alvino Chapel RCS Referring Phys: [id number] Grant  1. Left ventricular ejection fraction, by estimation, is 60 to 65%. The left ventricle has normal function. The left ventricle has no regional wall motion abnormalities. There is mild left ventricular hypertrophy. Left ventricular diastolic parameters were normal.  2. Right ventricular systolic function is normal. The right ventricular size is normal. There is mildly elevated pulmonary artery systolic pressure.  3. The mitral valve is normal in structure. No evidence of mitral valve regurgitation. No evidence of mitral stenosis.  4. The aortic valve is tricuspid. Aortic valve regurgitation is not visualized. No aortic stenosis is present.  5. The inferior vena cava is normal in size with  greater than 50% respiratory variability, suggesting right atrial pressure of 3 mmHg.  6. Cannot exclude a small PFO. FINDINGS  Left Ventricle: Left ventricular ejection fraction, by estimation, is 60 to 65%. The left ventricle has normal function. The left ventricle has no regional wall motion abnormalities.  The left ventricular internal cavity size was normal in size. There is  mild left ventricular hypertrophy. Left ventricular diastolic parameters were normal. Right Ventricle: The right ventricular size is normal. No increase in right ventricular wall thickness. Right ventricular systolic function is normal. There is mildly elevated pulmonary artery systolic pressure. The tricuspid regurgitant velocity is 2.64  m/s, and with an assumed right atrial pressure of 15 mmHg, the estimated right ventricular systolic pressure is 0000000 mmHg. Left Atrium: Left atrial size was normal in size. Right Atrium: Right atrial size was normal in size. Pericardium: There is no evidence of pericardial effusion. Mitral Valve: The mitral valve is normal in structure. No evidence of mitral valve regurgitation. No evidence of mitral valve stenosis. Tricuspid Valve: The tricuspid valve is normal in structure. Tricuspid valve regurgitation is mild . No evidence of tricuspid stenosis. Aortic Valve: The aortic valve is tricuspid. Aortic valve regurgitation is not visualized. No aortic stenosis is present. Aortic valve mean gradient measures 6.1 mmHg. Aortic valve peak gradient measures 10.8 mmHg. Aortic valve area, by VTI measures 3.37  cm. Pulmonic Valve: The pulmonic valve was not well visualized. Pulmonic valve regurgitation is not visualized. No evidence of pulmonic stenosis. Aorta: The aortic root is normal in size and structure. Venous: The inferior vena cava is normal in size with greater than 50% respiratory variability, suggesting right atrial pressure of 3 mmHg. IAS/Shunts: Cannot exclude a small PFO.  LEFT VENTRICLE PLAX 2D  LVIDd:         4.90 cm   Diastology LVIDs:         3.00 cm   LV e' medial:    7.89 cm/s LV PW:         1.20 cm   LV E/e' medial:  10.9 LV IVS:        1.20 cm   LV e' lateral:   15.40 cm/s LVOT diam:     2.25 cm   LV E/e' lateral: 5.6 LV SV:         120 LV SV Index:   63 LVOT Area:     3.98 cm  RIGHT VENTRICLE RV S prime:     20.90 cm/s TAPSE (M-mode): 3.4 cm LEFT ATRIUM           Index        RIGHT ATRIUM           Index LA diam:      4.00 cm 2.09 cm/m   RA Area:     20.70 cm LA Vol (A2C): 91.6 ml 47.95 ml/m  RA Volume:   51.40 ml  26.91 ml/m LA Vol (A4C): 61.4 ml 32.14 ml/m  AORTIC VALVE AV Area (Vmax):    3.53 cm AV Area (Vmean):   2.99 cm AV Area (VTI):     3.37 cm AV Vmax:           164.27 cm/s AV Vmean:          115.319 cm/s AV VTI:            0.357 m AV Peak Grad:      10.8 mmHg AV Mean Grad:      6.1 mmHg LVOT Vmax:         146.00 cm/s LVOT Vmean:        86.700 cm/s LVOT VTI:          0.303 m LVOT/AV VTI ratio: 0.85  AORTA Ao Root diam: 3.20 cm MITRAL VALVE  TRICUSPID VALVE MV Area (PHT): 2.24 cm    TR Peak grad:   27.9 mmHg MV Decel Time: 338 msec    TR Vmax:        264.00 cm/s MV E velocity: 85.70 cm/s MV A velocity: 77.60 cm/s  SHUNTS MV E/A ratio:  1.10        Systemic VTI:  0.30 m                            Systemic Diam: 2.25 cm Dina Rich MD Electronically signed by Dina Rich MD Signature Date/Time: 01/18/2021/12:47:38 PM    Final     Review of Systems  Constitutional:  Negative for chills, diaphoresis and fever.  HENT:  Negative for ear discharge, ear pain, hearing loss and tinnitus.   Eyes:  Negative for photophobia and pain.  Respiratory:  Negative for cough and shortness of breath.   Cardiovascular:  Negative for chest pain.  Gastrointestinal:  Negative for abdominal pain, nausea and vomiting.  Genitourinary:  Negative for dysuria, flank pain, frequency and urgency.  Musculoskeletal:  Positive for arthralgias (Ankle pain, left) and joint swelling (Left  ankle). Negative for back pain, myalgias and neck pain.  Neurological:  Negative for dizziness and headaches.  Hematological:  Does not bruise/bleed easily.  Psychiatric/Behavioral:  The patient is not nervous/anxious.   Blood pressure (!) 161/97, pulse 89, temperature 97.9 F (36.6 C), temperature source Oral, resp. rate 20, height 5\' 8"  (1.727 m), weight 77.4 kg, SpO2 100 %. Physical Exam Constitutional:      General: He is not in acute distress.    Appearance: He is well-developed. He is not diaphoretic.  HENT:     Head: Normocephalic and atraumatic.  Eyes:     General: No scleral icterus.       Right eye: No discharge.        Left eye: No discharge.     Conjunctiva/sclera: Conjunctivae normal.  Cardiovascular:     Rate and Rhythm: Normal rate and regular rhythm.  Pulmonary:     Effort: Pulmonary effort is normal. No respiratory distress.  Musculoskeletal:     Cervical back: Normal range of motion.     Comments: LLE No traumatic wounds or ecchymosis, excoriated rash lateral ankle, atrophy vs decreased muscle tone calf  Severe TTP anterior, medial ankle, mod TTP posterior ankle, no TTP lateral ankle, excellent AROM ankle, mod edema about ankle and midfoot  No knee effusion  Knee stable to varus/ valgus and anterior/posterior stress  Sens SPN intact, TN>DPN paresthetic   Motor EHL, ext, flex, evers grossly intact but limited by pain, but as noted above excellent motion without resistance  DP 2+, PT 1+  Skin:    General: Skin is warm and dry.  Neurological:     Mental Status: He is alert.  Psychiatric:        Mood and Affect: Mood normal.        Behavior: Behavior normal.    Assessment/Plan: Left ankle pain -- This is somewhat of an enigma. With normal (except for SQ edema) MRI and excellent ankle motion infectious or inflammatory arthropathies are essentially excluded as are traumatic injuries. Given his paresthesias and possible calf atrophy we should probably r/o radicular  etiology related to L5/S1 and I will order MRI. The swelling would be a rare presentation of this however. Given the rash (he denies any excess scratching, etc) I also worry about a vasculitis and would  recommend GS consultation for punch bx. CRPS is also a possibility though doesn't really fit with the rash. CAM boot seems unhelpful for symptomatology so would discontinue this modality.    Lisette Abu, PA-C Orthopedic Surgery [phone] 01/20/2021, 9:13 AM

## 2021-01-20 NOTE — Progress Notes (Signed)
PROGRESS NOTE  Steve [name]  DOB: 10-Oct-1965  PCP: No primary care provider on file. [id number]  DOA: 01/05/2021  LOS: 15 days  Hospital Day: 16  Chief Complaint  Patient presents with   Altered Mental Status   Seizures    Brief narrative: Steve [name] is a 56 y.o. male with history of major depression, anxiety who was in a group home for last 5 months. He was brought to the ED on 12/20 from a group home for convalescence, combativeness and altered sensorium.  Per collateral history, patient was not acting typical for last few days. In the ED, patient was confused, combative Labs showed AKI with creatinine of 3.27, rhabdomyolysis, abnormal LFTs, elevated WBC count at 23.7 and lactic acid level at 7.6. 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  IV fluid resuscitation started Patient was admitted to ICU. 12/21, underwent LP which showed staph in CSF, felt to be contaminant.  EEG did not show any seizures 12/24, HD catheter was placed but patient did not require HD as creatinine started to improve 12/31, with clinical improvement, [facility]  Subjective: Patient was seen and examined this morning.  Lying on bed.  Continues to have left ankle pain.  Assessment/Plan: Acute renal failure secondary to rhabdomyolysis Acute metabolic acidosis -His creatinine continue to worsen to peak at 7.48 on 12/26.  He was prepped for dialysis, had a HD catheter placed but never needed dialysis.  -Creatinine steadily improved, down to 1.6 on last check on 1/3. Serum bicarbonate level improved as well.   -Nephrology consult appreciated -Continue normal saline.  Continue to monitor Recent Labs    01/10/21 0741 01/11/21 0217 01/12/21 0639 01/13/21 0241 01/14/21 0207 01/15/21 0100  01/16/21 0141 01/17/21 0141 01/18/21 0213 01/19/21 0158  BUN 89* 84* 76* 70* 62* 54* 39* 27* 19 16  CREATININE 7.61* 7.48* 7.20* 7.03* 6.31* 5.38* 4.35* 2.98* 2.17* 1.60*  CO2 24 24 31 31 29 29 28 27 26  23   Rhabdomyolysis -Primary cause of AKI.  CK level improved with IV fluid. Recent Labs  Lab 01/15/21 0100 01/17/21 0141 01/18/21 0213 01/19/21 0158  CKTOTAL 2,328* 1,023* 611* 376    Uremic encephalopathy -Was combative on admission. Mental status improved with improvement in uremia. -Patient states he has history of major depression and anxiety.   -Currently he is on Prozac 40 mg daily, Seroquel 100 mg twice daily, BuSpar 10 mg twice daily, trazodone 50 mg nightly.    Left ankle swelling -Ongoing for last 2 to 3 days.  Patient has swelling, pain and tenderness and decreased range of motion of left ankle.  X-ray and MRI unremarkable. -Orthopedics consulted.  Seizure -Generalized convulsion was noted by his roommate prior to presentation. -EEG unremarkable.  Neurology consult appreciated.  Currently on Vimpat 100 mg twice daily given renal failure and possibility of Keppra contributing to agitation.  ?? Upper extremity DVT -Per critical care note, hemodialysis cath had a clot on it when it was removed.  Ultrasound duplex scan of upper extremities was obtained on 12/31 which did not show any evidence of DVT.  Because of his risk of falls, noncompliance, recurrent seizures, I would avoid long-term anticoagulation without definite evidence of DVT.  I stopped heparin drip.  Aspiration pneumonia -Completed Zosyn -Isolated fever 12/27-has been stable, no associated leukocytosis -No recurrence  of the fever  Hypokalemia/hypomagnesemia -Potassium and magnesium replacement given Recent Labs  Lab 01/14/21 0207 01/15/21 0100 01/15/21 0808 01/16/21 0141 01/17/21 0141 01/18/21 0213 01/19/21 0158  K 3.2* 3.2*  --  3.4* 3.5 3.8 3.5  MG  --   --  1.5*  --  2.3  --   --   PHOS 5.4*  4.5  --  3.5 3.6  --   --    Acute liver injury -His AST and ALT were significantly elevated to over 700s on admission.  Gradually improved . Recent Labs  Lab 01/14/21 0207 01/15/21 0100 01/16/21 0141  AST  --  91*  --   ALT  --  137*  --   ALKPHOS  --  48  --   BILITOT  --  0.6  --   PROT  --  5.4*  --   ALBUMIN 2.2* 2.3*   2.2* 2.6*   Evidence of right ventricular dysfunction and recent echocardiogram -Echo 1/2 with EF 60 to 65%.  Cannot rule out a small PFO. -Not clinically significant as patient has no symptoms related to it. Continue to monitor as an outpatient.  Mobility: Encourage ambulation Living condition: Group home Goals of care:   Code Status: Full Code  Nutritional status: Body mass index is 25.95 kg/m.      Diet:  Diet Order             Diet general           Diet renal with fluid restriction Fluid restriction: Other (see comments); Room service appropriate? Yes; Fluid consistency: Thin  Diet effective now                  DVT prophylaxis:  heparin injection 5,000 Units Start: 01/16/21 1545   Antimicrobials: None Fluid: None Consultants: Nephrology Family Communication: None at bedside  Status is: Inpatient  Continue in-hospital care because: Unable to discharge because of significant pain in his left ankle.. Level of care: Med-Surg   Dispo: The patient is from: Group home              Anticipated d/c is to: Group home versus home.  No medical insurance for SNF discharge              Patient currently is not medically stable to d/c.   Difficult to place patient No     Infusions:   sodium chloride Stopped (01/08/21 2354)   lacosamide (VIMPAT) IV 100 mg (01/20/21 1026)    Scheduled Meds:  amLODipine  2.5 mg Oral Daily   aspirin  325 mg Oral Daily   Or   aspirin  300 mg Rectal Daily   busPIRone  10 mg Oral BID   docusate sodium  100 mg Oral BID   feeding supplement  237 mL Oral BID BM   FLUoxetine  40 mg Oral Daily   heparin  injection (subcutaneous)  5,000 Units Subcutaneous Q8H   influenza vac split quadrivalent PF  0.5 mL Intramuscular Tomorrow-1000   mouth rinse  15 mL Mouth Rinse BID   nicotine  21 mg Transdermal Daily   oxyCODONE  10 mg Oral Q6H   polyethylene glycol  17 g Oral Daily   QUEtiapine  100 mg Oral BID   sodium chloride flush  3 mL Intravenous Q12H   traZODone  50 mg Oral QHS    PRN meds: sodium chloride, albuterol, diphenhydrAMINE, docusate sodium, morphine injection, ondansetron (ZOFRAN) IV, polyethylene glycol, sodium chloride flush  Antimicrobials: Anti-infectives (From admission, onward)    Start     Dose/Rate Route Frequency Ordered Stop   01/07/21 1500  vancomycin (VANCOREADY) IVPB 750 mg/150 mL  Status:  Discontinued        750 mg 150 mL/hr over 60 Minutes Intravenous Every 24 hours 01/06/21 1326 01/07/21 0802   01/07/21 0802  vancomycin variable dose per unstable renal function (pharmacist dosing)  Status:  Discontinued         Does not apply See admin instructions 01/07/21 0802 01/07/21 1000   01/06/21 2200  cefTRIAXone (ROCEPHIN) 2 g in sodium chloride 0.9 % 100 mL IVPB  Status:  Discontinued        2 g 200 mL/hr over 30 Minutes Intravenous Every 12 hours 01/06/21 1635 01/07/21 1000   01/06/21 1600  ceFEPIme (MAXIPIME) 2 g in sodium chloride 0.9 % 100 mL IVPB  Status:  Discontinued        2 g 200 mL/hr over 30 Minutes Intravenous Every 24 hours 01/05/21 1612 01/05/21 1721   01/06/21 1415  vancomycin (VANCOREADY) IVPB 1500 mg/300 mL        1,500 mg 150 mL/hr over 120 Minutes Intravenous  Once 01/06/21 1326 01/06/21 1931   01/05/21 1845  Ampicillin-Sulbactam (UNASYN) 3 g in sodium chloride 0.9 % 100 mL IVPB  Status:  Discontinued        3 g 200 mL/hr over 30 Minutes Intravenous Every 12 hours 01/05/21 1754 01/06/21 1635   01/05/21 1645  vancomycin (VANCOCIN) IVPB 1000 mg/200 mL premix        1,000 mg 200 mL/hr over 60 Minutes Intravenous  Once 01/05/21 1513 01/05/21 1752    01/05/21 1612  vancomycin variable dose per unstable renal function (pharmacist dosing)  Status:  Discontinued         Does not apply See admin instructions 01/05/21 1612 01/05/21 1721   01/05/21 1515  ceFEPIme (MAXIPIME) 2 g in sodium chloride 0.9 % 100 mL IVPB        2 g 200 mL/hr over 30 Minutes Intravenous  Once 01/05/21 1513 01/05/21 1648       Objective: Vitals:   01/20/21 0333 01/20/21 0841  BP: (!) 150/97 (!) 161/97  Pulse: 70 89  Resp: 19 20  Temp: 98.3 F (36.8 C) 97.9 F (36.6 C)  SpO2: 99% 100%    Intake/Output Summary (Last 24 hours) at 01/20/2021 1046 Last data filed at 01/20/2021 0600 Gross per 24 hour  Intake --  Output 1275 ml  Net -1275 ml   Filed Weights   01/13/21 0500 01/16/21 0500 01/17/21 0512  Weight: 81.5 kg 78.6 kg 77.4 kg   Weight change:  Body mass index is 25.95 kg/m.   Physical Exam: General exam: Pleasant, middle-aged Caucasian male.  Not in physical distress. Skin: No rashes, lesions or ulcers. HEENT: Atraumatic, normocephalic, no obvious bleeding Lungs: Clear to auscultation bilaterally CVS: Regular rate and rhythm, no murmur GI/Abd soft, nontender, nondistended, and bowel sound present  CNS: Alert, awake, oriented x3. Psychiatry: Mood appropriate Extremities: No pedal edema, no calf tenderness.  Left ankle continues to be swollen, warm, tender with decreased range of motion.    Data Review: I have personally reviewed the laboratory data and studies available.  F/u labs ordered Unresulted Labs (From admission, onward)     Start     Ordered   01/21/21 0500  CBC with Differential/Platelet  Tomorrow morning,   R       Question:  Specimen collection  method  Answer:  Lab=Lab collect   01/20/21 1046   01/21/21 0500  Comprehensive metabolic panel  Tomorrow morning,   R       Question:  Specimen collection method  Answer:  Lab=Lab collect   01/20/21 1046   Signed and Held  Hepatitis B surface antigen  (New Admission Hemo Labs (Hepatitis  B))  Once,   R       Question:  Specimen collection method  Answer:  Lab=Lab collect   Signed and Held   Signed and Held  Hepatitis B surface antibody  (New Admission Hemo Labs (Hepatitis B))  Once,   R       Question:  Specimen collection method  Answer:  Lab=Lab collect   Signed and Held   Signed and Held  Hepatitis B surface antibody,quantitative  (New Admission Hemo Labs (Hepatitis B))  Once,   R       Question:  Specimen collection method  Answer:  Lab=Lab collect   Signed and Held            Signed, Lorin Glass, MD [facility] 01/20/2021

## 2021-01-20 NOTE — Plan of Care (Signed)

## 2021-01-20 NOTE — Progress Notes (Signed)
Called to room by physical therapy (PT) whom states patient is in the shower. Writer found patient taking a shower. Patient stated he turned off his own bed alarm. Writer explained to patient how unsafe his decision was.  Writer assisted patient out of shower, which required moderate assistance.  Patient assisted back to bed with walker and  standby assist by PT.  Patient agreed not to turn off  bed alarm or get up by himself again.

## 2021-01-20 NOTE — Plan of Care (Signed)
  Problem: Education: Goal: Expressions of having a comfortable level of knowledge regarding the disease process will increase Outcome: Progressing   Problem: Coping: Goal: Ability to adjust to condition or change in health will improve Outcome: Progressing   Problem: Health Behavior/Discharge Planning: Goal: Compliance with prescribed medication regimen will improve Outcome: Progressing   Problem: Medication: Goal: Risk for medication side effects will decrease Outcome: Progressing   Problem: Clinical Measurements: Goal: Complications related to the disease process, condition or treatment will be avoided or minimized Outcome: Progressing   Problem: Safety: Goal: Verbalization of understanding the information provided will improve Outcome: Progressing   

## 2021-01-21 LAB — COMPREHENSIVE METABOLIC PANEL
ALT: 48 U/L — ABNORMAL HIGH (ref 0–44)
AST: 28 U/L (ref 15–41)
Albumin: 3 g/dL — ABNORMAL LOW (ref 3.5–5.0)
Alkaline Phosphatase: 42 U/L (ref 38–126)
Anion gap: 5 (ref 5–15)
BUN: 11 mg/dL (ref 6–20)
CO2: 22 mmol/L (ref 22–32)
Calcium: 8.6 mg/dL — ABNORMAL LOW (ref 8.9–10.3)
Chloride: 110 mmol/L (ref 98–111)
Creatinine, Ser: 1.18 mg/dL (ref 0.61–1.24)
GFR, Estimated: >60 mL/min (ref >60)
Glucose, Bld: 94 mg/dL (ref 70–99)
Potassium: 3.4 mmol/L — ABNORMAL LOW (ref 3.5–5.1)
Sodium: 137 mmol/L (ref 135–145)
Total Bilirubin: 0.8 mg/dL (ref 0.3–1.2)
Total Protein: 6.6 g/dL (ref 6.5–8.1)

## 2021-01-21 LAB — CBC WITH DIFFERENTIAL/PLATELET
Abs Immature Granulocytes: 0.01 10*3/uL (ref 0.00–0.07)
Basophils Absolute: 0.1 10*3/uL (ref 0.0–0.1)
Basophils Relative: 1 %
Eosinophils Absolute: 0.2 10*3/uL (ref 0.0–0.5)
Eosinophils Relative: 4 %
HCT: 28.1 % — ABNORMAL LOW (ref 39.0–52.0)
Hemoglobin: 9.4 g/dL — ABNORMAL LOW (ref 13.0–17.0)
Immature Granulocytes: 0 %
Lymphocytes Relative: 42 %
Lymphs Abs: 2.3 10*3/uL (ref 0.7–4.0)
MCH: 32 pg (ref 26.0–34.0)
MCHC: 33.5 g/dL (ref 30.0–36.0)
MCV: 95.6 fL (ref 80.0–100.0)
Monocytes Absolute: 0.6 10*3/uL (ref 0.1–1.0)
Monocytes Relative: 11 %
Neutro Abs: 2.3 10*3/uL (ref 1.7–7.7)
Neutrophils Relative %: 42 %
Platelets: 448 10*3/uL — ABNORMAL HIGH (ref 150–400)
RBC: 2.94 MIL/uL — ABNORMAL LOW (ref 4.22–5.81)
RDW: 14.1 % (ref 11.5–15.5)
WBC: 5.6 10*3/uL (ref 4.0–10.5)
nRBC: 0 % (ref 0.0–0.2)

## 2021-01-21 NOTE — Progress Notes (Signed)
Occupational Therapy Treatment Patient Details Name: Steve [name] MRN: [id number] DOB: [birth year] Today's Date: 01/21/2021   History of present illness Pt is a 56 y/o male who presented on 01/05/21 from his group home (drug/alcohol rehab?) s/p witnessed seizure and AMS. He was found to have AKI and rhabdomyolysis. Pt with c/o L ankle pain since 01/16/21 - imaging negative, ortho consulted.   No PMH noted in the chart.   OT comments  Pt progressing towards OT goals this morning, although seemed a little confused at times asking about "Steve [name]" and "did Steve [name] get you those same towels this year?" Required convincing to participate in therapy, set up for bed level LB dressing to don paper scrub pants, and sock on RLE, but required max A for LLE. Pt stating "I can't even touch it, how am I going to put weight on it?" However Pt pushing on BLE to bridge hips for dressing. Pt able to transfer to chair stand pivot with min A and RW, Pt keeping LLE off the floor. OT will continue to follow acutely. UPdated post-acute recommendations to HHOT.   Recommendations for follow up therapy are one component of a multi-disciplinary discharge planning process, led by the attending physician.  Recommendations may be updated based on patient status, additional functional criteria and insurance authorization.    Follow Up Recommendations  Home health OT    Assistance Recommended at Discharge Frequent or constant Supervision/Assistance  Patient can return home with the following  A little help with walking and/or transfers;A little help with bathing/dressing/bathroom;Assistance with cooking/housework;Assist for transportation;Help with stairs or ramp for entrance   Equipment Recommendations  None recommended by OT    Recommendations for Other Services      Precautions / Restrictions Precautions Precautions: Fall Precaution Comments: L foot pain Restrictions Weight Bearing Restrictions: No LLE Weight Bearing:  Weight bearing as tolerated Other Position/Activity Restrictions: CAM boot not needed per ortho note       Mobility Bed Mobility Overal bed mobility: Modified Independent                  Transfers Overall transfer level: Needs assistance Equipment used: Rolling walker (2 wheels) Transfers: Sit to/from Stand;Bed to chair/wheelchair/BSC Sit to Stand: Min guard Stand pivot transfers: Min guard         General transfer comment: Min guard for safety during session     Balance Overall balance assessment: Needs assistance Sitting-balance support: Feet supported;No upper extremity supported Sitting balance-Leahy Scale: Normal     Standing balance support: Bilateral upper extremity supported;No upper extremity supported Standing balance-Leahy Scale: Fair Standing balance comment: needs at least 1 UE support for balance in standing                           ADL either performed or assessed with clinical judgement   ADL Overall ADL's : Needs assistance/impaired     Grooming: Set up;Wash/dry hands;Wash/dry face;Sitting Grooming Details (indicate cue type and reason): unable to maintain standing for grooming tasks             Lower Body Dressing: Moderate assistance;Sitting/lateral leans Lower Body Dressing Details (indicate cue type and reason): unable to don LLE socks, would not attempt to perform figure 4 for easier access Toilet Transfer: Stand-pivot;Minimal assistance;Rolling walker (2 wheels) Toilet Transfer Details (indicate cue type and reason): simulated from bed>recliner         Functional mobility during ADLs: Minimal assistance;Rolling walker (2 wheels) General  ADL Comments: Pt impulsive, seems disoriented at times, decreased access to LB for ADL (seems more mental than physical this session)    Extremity/Trunk Assessment Upper Extremity Assessment Upper Extremity Assessment: Overall WFL for tasks assessed            Vision        Perception     Praxis      Cognition Arousal/Alertness: Awake/alert Behavior During Therapy: Anxious;Impulsive Overall Cognitive Status: No family/caregiver present to determine baseline cognitive functioning Area of Impairment: Attention;Safety/judgement;Problem solving;Memory                   Current Attention Level: Selective Memory: Decreased short-term memory   Safety/Judgement: Decreased awareness of safety   Problem Solving: Requires verbal cues;Requires tactile cues;Slow processing General Comments: Pt stating "don't push on my ankle it hurts too much"...but then pushing weight on BLE to perform hip elevation to don pants. and refuses to attempt to stand and put weight on his LLE          Exercises     Shoulder Instructions       General Comments VSS On RA    Pertinent Vitals/ Pain       Pain Assessment: 0-10 Pain Score: 6  Faces Pain Scale: Hurts little more Pain Location: L foot/ankle (lower calf, down behind ankle, heel, under foot to toes) Pain Descriptors / Indicators: Guarding;Discomfort;Grimacing Pain Intervention(s): Limited activity within patient's tolerance;Monitored during session;Repositioned;RN gave pain meds during session;Other (comment) (elevation)  Home Living Family/patient expects to be discharged to:: Skilled nursing facility Living Arrangements: Group Home                                      Prior Functioning/Environment              Frequency  Min 2X/week        Progress Toward Goals  OT Goals(current goals can now be found in the care plan section)  Progress towards OT goals: Progressing toward goals  Acute Rehab OT Goals Patient Stated Goal: decrease pain OT Goal Formulation: With patient Time For Goal Achievement: 01/30/21 Potential to Achieve Goals: Good  Plan Discharge plan needs to be updated    Co-evaluation                 AM-PAC OT "6 Clicks" Daily Activity     Outcome  Measure   Help from another person eating meals?: None Help from another person taking care of personal grooming?: A Little Help from another person toileting, which includes using toliet, bedpan, or urinal?: A Lot Help from another person bathing (including washing, rinsing, drying)?: A Lot Help from another person to put on and taking off regular upper body clothing?: A Little Help from another person to put on and taking off regular lower body clothing?: A Lot 6 Click Score: 16    End of Session Equipment Utilized During Treatment: Gait belt;Rolling walker (2 wheels)  OT Visit Diagnosis: Unsteadiness on feet (R26.81);Pain;Muscle weakness (generalized) (M62.81);Dizziness and giddiness (R42) Pain - Right/Left: Left Pain - part of body: Leg;Ankle and joints of foot   Activity Tolerance Patient limited by pain   Patient Left in chair;with call bell/phone within reach;with chair alarm set (alarm out of reach and room door open)   Nurse Communication Mobility status        Time: [id number] OT Time Calculation (min): 25 min  Charges: OT General Charges $OT Visit: 1 Visit OT Treatments $Self Care/Home Management : 23-37 mins  Nyoka Cowden OTR/L Acute Rehabilitation Services Pager: [phone] Office: [phone]   Evern Bio Bretta Fees 01/21/2021, 11:16 AM

## 2021-01-21 NOTE — TOC Progression Note (Signed)
Transition of Care [facility]) - Progression Note    Patient Details  Name: Karanveer Ramakrishnan MRN: [id number] Date of Birth: Oct 25, 1965  Transition of Care [facility]) CM/SW Contact  Kermit Balo, RN Phone Number: 01/21/2021, 12:03 PM  Clinical Narrative:    Patient with recommendations for [facility] but not able to bear weight on LLE. Pt using walker to get to bathroom and perform transfers. CM talked to Casimiro Needle at [facility] and patient will need to ambulate independently prior to returning. He has to work to participate in the program.  CM reached out to patient's son and he is now hesitant about taking him to his home. He states he wouldn't be able to pick him up until at least Saturday as its a 2 hour drive and he works. He also says he needs to talk with his girlfriend that share the apartment.  Pt has no insurance so will be DTP for SNF rehab.  MD updated.         Expected Discharge Plan and Services           Expected Discharge Date: 01/19/21                                     Social Determinants of Health (SDOH) Interventions    Readmission Risk Interventions No flowsheet data found.

## 2021-01-21 NOTE — Progress Notes (Signed)
PROGRESS NOTE  Steve [name]  DOB: 11-04-1965  PCP: No primary care provider on file. SAY:[id number]  DOA: 01/05/2021  LOS: 16 days  Hospital Day: 17  Chief Complaint  Patient presents with   Altered Mental Status   Seizures    Brief narrative: Steve [name] is a 56 y.o. male with history of major depression, anxiety who was in a group home for last 5 months. He was brought to the ED on 12/20 from a group home for convalescence, combativeness and altered sensorium.  Per collateral history, patient was not acting typical for last few days. In the ED, patient was confused, combative Labs showed AKI with creatinine of 3.27, rhabdomyolysis, abnormal LFTs, elevated WBC count at 23.7 and lactic acid level at 7.6. 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  IV fluid resuscitation started Patient was admitted to ICU. 12/21, underwent LP which showed staph in CSF, felt to be contaminant.  EEG did not show any seizures 12/24, HD catheter was placed but patient did not require HD as creatinine started to improve 12/31, with clinical improvement, patient was transferred out of ICU to [facility].  Subjective: Patient was seen and examined this morning.  Lying on bed.  Continues to have left ankle pain. MRI lumbar spine done yesterday per orthopedic recommendation.  Assessment/Plan: Acute renal failure secondary to rhabdomyolysis Acute metabolic acidosis -His creatinine continue to worsen to peak at 7.48 on 12/26.  He was prepped for dialysis, had a HD catheter placed but never needed dialysis.  -Creatinine steadily improved, down to 1.6 on last check on 1/3. Serum bicarbonate level improved as well.   -Nephrology consult appreciated -Continue to monitor Recent Labs    01/11/21 0217 01/12/21 0639 01/13/21 0241 01/14/21 0207  01/15/21 0100 01/16/21 0141 01/17/21 0141 01/18/21 0213 01/19/21 0158 01/21/21 0241  BUN 84* 76* 70* 62* 54* 39* 27* 19 16 11   CREATININE 7.48* 7.20* 7.03* 6.31* 5.38* 4.35* 2.98* 2.17* 1.60* 1.18  CO2 24 31 31 29 29 28 27 26 23  22    Rhabdomyolysis -Primary cause of AKI.  CK level improved with IV fluid. Recent Labs  Lab 01/15/21 0100 01/17/21 0141 01/18/21 0213 01/19/21 0158  CKTOTAL 2,328* 1,023* 611* 376     Uremic encephalopathy -Was combative on admission. Mental status improved with improvement in uremia. -Patient states he has history of major depression and anxiety.   -Currently he is on Prozac 40 mg daily, Seroquel 100 mg twice daily, BuSpar 10 mg twice daily, trazodone 50 mg nightly.    Left ankle swelling -Ongoing for last 2 to 3 days.  Patient has swelling, pain and tenderness and decreased range of motion of left ankle.  X-ray and MRI unremarkable. -Orthopedics consulted.  Orthopedics ordered MRI lumbar spine yesterday.  Follow-up pending  Seizure -Generalized convulsion was noted by his roommate prior to presentation. -EEG unremarkable.  Neurology consult appreciated.  Currently on Vimpat 100 mg twice daily given renal failure and possibility of Keppra contributing to agitation.  ?? Upper extremity DVT -Per critical care note, hemodialysis cath had a clot on it when it was removed.  Ultrasound duplex scan of upper extremities was obtained on 12/31 which did not show any evidence of DVT.  Because of his risk of falls, noncompliance, recurrent seizures, I would avoid long-term anticoagulation without definite evidence of DVT.  I stopped heparin  drip.  Aspiration pneumonia -Completed Zosyn -Isolated fever 12/27-has been stable, no associated leukocytosis -No recurrence of the fever  Hypokalemia/hypomagnesemia -Potassium and magnesium replacement given Recent Labs  Lab 01/15/21 0100 01/15/21 0808 01/16/21 0141 01/17/21 0141 01/18/21 0213 01/19/21 0158  01/21/21 0241  K 3.2*  --  3.4* 3.5 3.8 3.5 3.4*  MG  --  1.5*  --  2.3  --   --   --   PHOS 4.5  --  3.5 3.6  --   --   --     Acute liver injury -His AST and ALT were significantly elevated to over 700s on admission.  Gradually improved . Recent Labs  Lab 01/15/21 0100 01/16/21 0141 01/21/21 0241  AST 91*  --  28  ALT 137*  --  48*  ALKPHOS 48  --  42  BILITOT 0.6  --  0.8  PROT 5.4*  --  6.6  ALBUMIN 2.3*   2.2* 2.6* 3.0*    Evidence of right ventricular dysfunction and recent echocardiogram -Echo 1/2 with EF 60 to 65%.  Cannot rule out a small PFO. -Not clinically significant as patient has no symptoms related to it. Continue to monitor as an outpatient.  Mobility: Encourage ambulation Living condition: Group home Goals of care:   Code Status: Full Code  Nutritional status: Body mass index is 23.3 kg/m.      Diet:  Diet Order             Diet general           Diet renal with fluid restriction Fluid restriction: Other (see comments); Room service appropriate? Yes; Fluid consistency: Thin  Diet effective now                  DVT prophylaxis:  heparin injection 5,000 Units Start: 01/16/21 1545   Antimicrobials: None Fluid: None Consultants: Nephrology Family Communication: None at bedside  Status is: Inpatient  Continue in-hospital care because: Unable to discharge because patient continues to have significant pain in his left ankle.. Level of care: Med-Surg   Dispo: The patient is from: Group home              Anticipated d/c is to: Group home versus home.  No medical insurance for SNF discharge              Patient currently is not medically stable to d/c.   Difficult to place patient No     Infusions:   sodium chloride Stopped (01/08/21 2354)   lacosamide (VIMPAT) IV 100 mg (01/21/21 1304)    Scheduled Meds:  amLODipine  2.5 mg Oral Daily   aspirin  325 mg Oral Daily   Or   aspirin  300 mg Rectal Daily   busPIRone  10 mg Oral BID    docusate sodium  100 mg Oral BID   feeding supplement  237 mL Oral BID BM   FLUoxetine  40 mg Oral Daily   heparin injection (subcutaneous)  5,000 Units Subcutaneous Q8H   mouth rinse  15 mL Mouth Rinse BID   nicotine  21 mg Transdermal Daily   oxyCODONE  10 mg Oral Q6H   polyethylene glycol  17 g Oral Daily   QUEtiapine  100 mg Oral BID   sodium chloride flush  3 mL Intravenous Q12H   traZODone  50 mg Oral QHS    PRN meds: sodium chloride, albuterol, diphenhydrAMINE, docusate sodium, morphine injection, ondansetron (ZOFRAN) IV, polyethylene glycol, sodium chloride  flush   Antimicrobials: Anti-infectives (From admission, onward)    Start     Dose/Rate Route Frequency Ordered Stop   01/07/21 1500  vancomycin (VANCOREADY) IVPB 750 mg/150 mL  Status:  Discontinued        750 mg 150 mL/hr over 60 Minutes Intravenous Every 24 hours 01/06/21 1326 01/07/21 0802   01/07/21 0802  vancomycin variable dose per unstable renal function (pharmacist dosing)  Status:  Discontinued         Does not apply See admin instructions 01/07/21 0802 01/07/21 1000   01/06/21 2200  cefTRIAXone (ROCEPHIN) 2 g in sodium chloride 0.9 % 100 mL IVPB  Status:  Discontinued        2 g 200 mL/hr over 30 Minutes Intravenous Every 12 hours 01/06/21 1635 01/07/21 1000   01/06/21 1600  ceFEPIme (MAXIPIME) 2 g in sodium chloride 0.9 % 100 mL IVPB  Status:  Discontinued        2 g 200 mL/hr over 30 Minutes Intravenous Every 24 hours 01/05/21 1612 01/05/21 1721   01/06/21 1415  vancomycin (VANCOREADY) IVPB 1500 mg/300 mL        1,500 mg 150 mL/hr over 120 Minutes Intravenous  Once 01/06/21 1326 01/06/21 1931   01/05/21 1845  Ampicillin-Sulbactam (UNASYN) 3 g in sodium chloride 0.9 % 100 mL IVPB  Status:  Discontinued        3 g 200 mL/hr over 30 Minutes Intravenous Every 12 hours 01/05/21 1754 01/06/21 1635   01/05/21 1645  vancomycin (VANCOCIN) IVPB 1000 mg/200 mL premix        1,000 mg 200 mL/hr over 60 Minutes  Intravenous  Once 01/05/21 1513 01/05/21 1752   01/05/21 1612  vancomycin variable dose per unstable renal function (pharmacist dosing)  Status:  Discontinued         Does not apply See admin instructions 01/05/21 1612 01/05/21 1721   01/05/21 1515  ceFEPIme (MAXIPIME) 2 g in sodium chloride 0.9 % 100 mL IVPB        2 g 200 mL/hr over 30 Minutes Intravenous  Once 01/05/21 1513 01/05/21 1648       Objective: Vitals:   01/21/21 1159 01/21/21 1956  BP: 130/88 (!) 147/86  Pulse: 73 78  Resp:  19  Temp: 98.3 F (36.8 C) 98.8 F (37.1 C)  SpO2: 98% 98%    Intake/Output Summary (Last 24 hours) at 01/21/2021 2110 Last data filed at 01/21/2021 0547 Gross per 24 hour  Intake --  Output 400 ml  Net -400 ml    Filed Weights   01/16/21 0500 01/17/21 0512 01/21/21 0546  Weight: 78.6 kg 77.4 kg 69.5 kg   Weight change:  Body mass index is 23.3 kg/m.   Physical Exam: General exam: Pleasant, middle-aged Caucasian male.  Not in physical distress. Skin: No rashes, lesions or ulcers. HEENT: Atraumatic, normocephalic, no obvious bleeding Lungs: Clear to auscultation bilaterally CVS: Regular rate and rhythm, no murmur GI/Abd soft, nontender, nondistended, and bowel sound present  CNS: Alert, awake, oriented x3. Psychiatry: Mood appropriate Extremities: No pedal edema, no calf tenderness.  Continues to have left ankle pain and swelling tenderness and decreased range of movement.   Data Review: I have personally reviewed the laboratory data and studies available.  F/u labs ordered [employer]ed Labs (From admission, onward)     Start     Ordered   Signed and Held  Hepatitis B surface antigen  (New Admission Hemo Labs (Hepatitis B))  Once,  R       Question:  Specimen collection method  Answer:  Lab=Lab collect   Signed and Held   Signed and Held  Hepatitis B surface antibody  (New Admission Hemo Labs (Hepatitis B))  Once,   R       Question:  Specimen collection method  Answer:  Lab=Lab  collect   Signed and Held   Signed and Held  Hepatitis B surface antibody,quantitative  (New Admission Hemo Labs (Hepatitis B))  Once,   R       Question:  Specimen collection method  Answer:  Lab=Lab collect   Signed and Held            Signed, Lorin GlassBinaya Indria Bishara, MD [facility] 01/21/2021

## 2021-01-22 MED ORDER — POTASSIUM CHLORIDE 20 MEQ PO PACK
40.0000 meq | PACK | Freq: Once | ORAL | Status: AC
Start: 2021-01-22 — End: 2021-01-22
  Administered 2021-01-22: 40 meq via ORAL
  Filled 2021-01-22: qty 2

## 2021-01-22 MED ORDER — GABAPENTIN 400 MG PO CAPS
400.0000 mg | ORAL_CAPSULE | Freq: Three times a day (TID) | ORAL | Status: DC
  Administered 2021-01-22 – 2021-01-24 (×7): 400 mg via ORAL
  Filled 2021-01-22 (×7): qty 1

## 2021-01-22 NOTE — TOC Progression Note (Addendum)
Transition of Care [facility]) - Progression Note    Patient Details  Name: Steve [name] MRN: [id number] Date of Birth: 05-02-1965  Transition of Care [facility]) CM/SW Contact  Kermit Balo, RN Phone Number: 01/22/2021, 1:36 PM  Clinical Narrative:    Pt did not call his son about discharge and living with him yesterday. CM has reached out to the son and he is going to call and discuss plans with his dad after work today. Pt confused when CM in the room today. MD updated.         Expected Discharge Plan and Services           Expected Discharge Date: 01/19/21                                     Social Determinants of Health (SDOH) Interventions    Readmission Risk Interventions No flowsheet data found.

## 2021-01-22 NOTE — Progress Notes (Signed)
PROGRESS NOTE  Steve [name]  DOB: 1965-06-12  PCP: No primary care provider on file. UXL:[id number]  DOA: 01/05/2021  LOS: 17 days  Hospital Day: 18  Chief Complaint  Patient presents with   Altered Mental Status   Seizures    Brief narrative: Steve [name] is a 56 y.o. male with history of major depression, anxiety who was in a group home for last 5 months. He was brought to the ED on 12/20 from a group home for convalescence, combativeness and altered sensorium.  Per collateral history, patient was not acting typical for last few days. In the ED, patient was confused, combative Labs showed AKI with creatinine of 3.27, rhabdomyolysis, abnormal LFTs, elevated WBC count at 23.7 and lactic acid level at 7.6. 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  IV fluid resuscitation started Patient was admitted to ICU. 12/21, underwent LP which showed staph in CSF, felt to be contaminant.  EEG did not show any seizures 12/24, HD catheter was placed but patient did not require HD as creatinine started to improve 12/31, with clinical improvement, patient was transferred out of ICU to [facility].  Subjective: Patient was seen and examined this morning.  Lying on bed.  Not in distress.  Continues to have pain and sensitivity on the left ankle.  Assessment/Plan: Left leg/foot pain Left ankle swelling -Ongoing for last 3 to 4 days. Patient has swelling, pain and tenderness and decreased range of motion of left ankle.  X-ray and MRI unremarkable.  Orthopedic consult obtained.  No local structural pathology identified.  No new recommendation. -MRI lumbar spine was obtained. It shows small foraminal disc protrusion at multiple levels from T11-L5.  I discussed the case with neurosurgeon Dr. Lovell Sheehan this morning.  He stated that the MRI is  very close to normal and is not the primary cause of his left ankle issues. -PTA, patient was on Neurontin 400 mg 3 times daily.  It was held on admission because of significant AKI.  Renal function has improved to normal now.  I wonder if his increased sensitivity of left foot is because of withdrawal to gabapentin.  I would resume him back on it.  Continue to monitor.  Seizure -Generalized convulsion was noted by his roommate prior to presentation. -EEG unremarkable.  Neurology consult appreciated.  Currently on Vimpat 100 mg twice daily given renal failure and possibility of Keppra contributing to agitation.  Acute renal failure secondary to rhabdomyolysis Acute metabolic acidosis -His creatinine continue to worsen to peak at 7.48 on 12/26.  Nephrology consult was obtained.  He was prepped for dialysis, had a HD catheter placed but never needed dialysis.  -Creatinine gradually improved to normal.  Serum bicarb level improved as well. -Nephrology consult appreciated  Rhabdomyolysis -Primary cause of AKI.  CK level improved with IV fluid. Recent Labs  Lab 01/17/21 0141 01/18/21 0213 01/19/21 0158  CKTOTAL 1,023* 611* 376    Uremic encephalopathy -Was combative on admission. Mental status improved with improvement in uremia. -Patient states he has history of major depression and anxiety.   -Currently he is on Prozac 40 mg daily, Seroquel 100 mg twice daily, BuSpar 10 mg twice daily, trazodone 50 mg nightly.    ?? Upper extremity DVT -Per critical care note, hemodialysis cath had a clot on it when it was removed.  Ultrasound duplex scan of upper  extremities was obtained on 12/31 which did not show any evidence of DVT.  Because of his risk of falls, noncompliance, recurrent seizures, I would avoid long-term anticoagulation without definite evidence of DVT.  I stopped heparin drip.  Aspiration pneumonia -Completed Zosyn -Isolated fever 12/27-has been stable, no associated  leukocytosis -No recurrence of the fever  Hypokalemia/hypomagnesemia -Potassium and magnesium replacement given Recent Labs  Lab 01/16/21 0141 01/17/21 0141 01/18/21 0213 01/19/21 0158 01/21/21 0241  K 3.4* 3.5 3.8 3.5 3.4*  MG  --  2.3  --   --   --   PHOS 3.5 3.6  --   --   --    Acute liver injury -His AST and ALT were significantly elevated to over 700s on admission.  Gradually improved . Recent Labs  Lab 01/16/21 0141 01/21/21 0241  AST  --  28  ALT  --  48*  ALKPHOS  --  42  BILITOT  --  0.8  PROT  --  6.6  ALBUMIN 2.6* 3.0*   Evidence of right ventricular dysfunction and recent echocardiogram -Echo 1/2 with EF 60 to 65%.  Cannot rule out a small PFO. -Not clinically significant as patient has no symptoms related to it. Continue to monitor as an outpatient.  Mobility: Encourage ambulation Living condition: Group home Goals of care:   Code Status: Full Code  Nutritional status: Body mass index is 23.3 kg/m.      Diet:  Diet Order             Diet general           Diet renal with fluid restriction Fluid restriction: Other (see comments); Room service appropriate? Yes; Fluid consistency: Thin  Diet effective now                  DVT prophylaxis:  heparin injection 5,000 Units Start: 01/16/21 1545   Antimicrobials: None Fluid: None Consultants: Nephrology Family Communication: None at bedside  Status is: Inpatient  Continue in-hospital care because: Unable to discharge because patient continues to have significant pain in his left ankle.. Level of care: Med-Surg   Dispo: The patient is from: Group home              Anticipated d/c is to: Group home versus home.  No medical insurance for SNF discharge              Patient currently is not medically stable to d/c.   Difficult to place patient No     Infusions:   sodium chloride Stopped (01/08/21 2354)   lacosamide (VIMPAT) IV 100 mg (01/22/21 1035)    Scheduled Meds:  amLODipine  2.5  mg Oral Daily   aspirin  325 mg Oral Daily   Or   aspirin  300 mg Rectal Daily   busPIRone  10 mg Oral BID   docusate sodium  100 mg Oral BID   feeding supplement  237 mL Oral BID BM   FLUoxetine  40 mg Oral Daily   gabapentin  400 mg Oral TID   heparin injection (subcutaneous)  5,000 Units Subcutaneous Q8H   mouth rinse  15 mL Mouth Rinse BID   nicotine  21 mg Transdermal Daily   oxyCODONE  10 mg Oral Q6H   polyethylene glycol  17 g Oral Daily   QUEtiapine  100 mg Oral BID   sodium chloride flush  3 mL Intravenous Q12H   traZODone  50 mg Oral QHS    PRN  meds: sodium chloride, albuterol, diphenhydrAMINE, docusate sodium, morphine injection, ondansetron (ZOFRAN) IV, polyethylene glycol, sodium chloride flush   Antimicrobials: Anti-infectives (From admission, onward)    Start     Dose/Rate Route Frequency Ordered Stop   01/07/21 1500  vancomycin (VANCOREADY) IVPB 750 mg/150 mL  Status:  Discontinued        750 mg 150 mL/hr over 60 Minutes Intravenous Every 24 hours 01/06/21 1326 01/07/21 0802   01/07/21 0802  vancomycin variable dose per unstable renal function (pharmacist dosing)  Status:  Discontinued         Does not apply See admin instructions 01/07/21 0802 01/07/21 1000   01/06/21 2200  cefTRIAXone (ROCEPHIN) 2 g in sodium chloride 0.9 % 100 mL IVPB  Status:  Discontinued        2 g 200 mL/hr over 30 Minutes Intravenous Every 12 hours 01/06/21 1635 01/07/21 1000   01/06/21 1600  ceFEPIme (MAXIPIME) 2 g in sodium chloride 0.9 % 100 mL IVPB  Status:  Discontinued        2 g 200 mL/hr over 30 Minutes Intravenous Every 24 hours 01/05/21 1612 01/05/21 1721   01/06/21 1415  vancomycin (VANCOREADY) IVPB 1500 mg/300 mL        1,500 mg 150 mL/hr over 120 Minutes Intravenous  Once 01/06/21 1326 01/06/21 1931   01/05/21 1845  Ampicillin-Sulbactam (UNASYN) 3 g in sodium chloride 0.9 % 100 mL IVPB  Status:  Discontinued        3 g 200 mL/hr over 30 Minutes Intravenous Every 12 hours  01/05/21 1754 01/06/21 1635   01/05/21 1645  vancomycin (VANCOCIN) IVPB 1000 mg/200 mL premix        1,000 mg 200 mL/hr over 60 Minutes Intravenous  Once 01/05/21 1513 01/05/21 1752   01/05/21 1612  vancomycin variable dose per unstable renal function (pharmacist dosing)  Status:  Discontinued         Does not apply See admin instructions 01/05/21 1612 01/05/21 1721   01/05/21 1515  ceFEPIme (MAXIPIME) 2 g in sodium chloride 0.9 % 100 mL IVPB        2 g 200 mL/hr over 30 Minutes Intravenous  Once 01/05/21 1513 01/05/21 1648       Objective: Vitals:   01/22/21 0327 01/22/21 0726  BP: (!) 157/94 (!) 139/94  Pulse: 77 77  Resp: 18 18  Temp: 98.2 F (36.8 C) 98.6 F (37 C)  SpO2: 96% 100%   No intake or output data in the 24 hours ending 01/22/21 1035  Filed Weights   01/16/21 0500 01/17/21 0512 01/21/21 0546  Weight: 78.6 kg 77.4 kg 69.5 kg   Weight change:  Body mass index is 23.3 kg/m.   Physical Exam: General exam: Pleasant, middle-aged Caucasian male.  Not in physical distress Skin: No rashes, lesions or ulcers. HEENT: Atraumatic, normocephalic, no obvious bleeding Lungs: Clear to auscultation bilaterally CVS: Regular rate and rhythm, no murmur GI/Abd soft, nontender, nondistended, and bowel sound present  CNS: Alert, awake, oriented x3. Psychiatry: Mood appropriate Extremities: No pedal edema, no calf tenderness.  Continues to have left ankle pain and swelling.  Tenderness and decreased range of movement.  Data Review: I have personally reviewed the laboratory data and studies available.  F/u labs ordered [employer]resulted Labs (From admission, onward)     Start     Ordered   Signed and Held  Hepatitis B surface antigen  (New Admission Hemo Labs (Hepatitis B))  Once,   R  Question:  Specimen collection method  Answer:  Lab=Lab collect   Signed and Held   Signed and Held  Hepatitis B surface antibody  (New Admission Hemo Labs (Hepatitis B))  Once,   R        Question:  Specimen collection method  Answer:  Lab=Lab collect   Signed and Held   Signed and Held  Hepatitis B surface antibody,quantitative  (New Admission Hemo Labs (Hepatitis B))  Once,   R       Question:  Specimen collection method  Answer:  Lab=Lab collect   Signed and Held            Signed, Lorin Glass, MD [facility] 01/22/2021

## 2021-01-22 NOTE — Progress Notes (Signed)
Physical Therapy Treatment Patient Details Name: Steve [name] MRN: [id number] DOB: 08/26/1965 Today's Date: 01/22/2021   History of Present Illness Pt is a 56 y/o male who presented on 01/05/21 from his group home (drug/alcohol rehab?) s/p witnessed seizure and AMS. He was found to have AKI and rhabdomyolysis. Pt with c/o L ankle pain since 01/16/21 - imaging negative, ortho consulted.   No PMH noted in the chart.    PT Comments    Pt was seen for progression of gait but cannot move today.  Stood a long time initially, declining to hop or walk with LLE on ground.  However, finally walks a short trip then would not sit on the bed again until PT got a nurse in to also encourage him to sit down.  Pt is unable to verbalize why he won't sit but seems more agitated by the inability to walk like he did yesterday.  However, he did hop and avoid WB on LLE yesterday.  Possibly he does not recall the details of visit yesterday.  Follow along with acute PT goals.   Recommendations for follow up therapy are one component of a multi-disciplinary discharge planning process, led by the attending physician.  Recommendations may be updated based on patient status, additional functional criteria and insurance authorization.  Follow Up Recommendations  Home health PT     Assistance Recommended at Discharge Intermittent Supervision/Assistance  Patient can return home with the following Help with stairs or ramp for entrance;Assist for transportation;Assistance with cooking/housework;A little help with walking and/or transfers;A little help with bathing/dressing/bathroom   Equipment Recommendations  Rolling walker (2 wheels);Wheelchair (measurements PT)    Recommendations for Other Services       Precautions / Restrictions Precautions Precautions: Fall Precaution Comments: L foot pain Restrictions Weight Bearing Restrictions: No LLE Weight Bearing: Weight bearing as tolerated Other Position/Activity  Restrictions: CAM boot not needed per ortho note     Mobility  Bed Mobility Overal bed mobility: Needs Assistance Bed Mobility: Supine to Sit;Sit to Supine     Supine to sit: Min guard Sit to supine: Min guard   General bed mobility comments: pt was assisted to pivot out and back with minor cues    Transfers Overall transfer level: Needs assistance Equipment used: Rolling walker (2 wheels) Transfers: Sit to/from Stand;Bed to chair/wheelchair/BSC Sit to Stand: Min guard Stand pivot transfers: Min assist;Min guard         General transfer comment: pt is refusing to sit twice during the session, unable to verbalize why he would not sit down    Ambulation/Gait Ambulation/Gait assistance: Min guard Gait Distance (Feet): 8 Feet Assistive device: Rolling walker (2 wheels) Gait Pattern/deviations: Step-to pattern;Decreased weight shift to left Gait velocity: decreased Gait velocity interpretation: <1.31 ft/sec, indicative of household ambulator Pre-gait activities: standing balance check but pt also refusing to follow instructions General Gait Details: pt declines to hop, trying to wb on LLE and very distressed but will also not sit down   Stairs             Wheelchair Mobility    Modified Rankin (Stroke Patients Only)       Balance Overall balance assessment: Needs assistance Sitting-balance support: Feet supported Sitting balance-Leahy Scale: Normal     Standing balance support: Bilateral upper extremity supported;During functional activity Standing balance-Leahy Scale: Poor  Cognition Arousal/Alertness: Awake/alert Behavior During Therapy: Anxious;Impulsive Overall Cognitive Status: No family/caregiver present to determine baseline cognitive functioning Area of Impairment: Problem solving;Awareness;Safety/judgement;Following commands;Attention;Memory                   Current Attention Level:  Selective Memory: Decreased short-term memory Following Commands: Follows one step commands with increased time Safety/Judgement: Decreased awareness of deficits;Decreased awareness of safety Awareness: Intellectual Problem Solving: Slow processing;Requires verbal cues;Requires tactile cues General Comments: pt could only walk a short trip and then refused to sit down for two longer times        Exercises      General Comments General comments (skin integrity, edema, etc.): pt is reluctant to walk to hop or to sit down.  Cannot verbalize what his issue is beyond pain      Pertinent Vitals/Pain Pain Assessment: Faces Faces Pain Scale: Hurts whole lot Breathing: normal Negative Vocalization: occasional moan/groan, low speech, negative/disapproving quality Facial Expression: sad, frightened, frown Body Language: tense, distressed pacing, fidgeting Consolability: distracted or reassured by voice/touch PAINAD Score: 4 Facial Expression: Tense Body Movements: Protection Muscle Tension: Tense, rigid Pain Location: L foot/ankle (lower calf, down behind ankle, heel, under foot to toes) Pain Descriptors / Indicators: Aching;Guarding;Grimacing Pain Intervention(s): Limited activity within patient's tolerance;Monitored during session;Repositioned    Home Living                          Prior Function            PT Goals (current goals can now be found in the care plan section) Acute Rehab PT Goals Patient Stated Goal: to get home    Frequency    Min 3X/week      PT Plan Current plan remains appropriate    Co-evaluation              AM-PAC PT "6 Clicks" Mobility   Outcome Measure  Help needed turning from your back to your side while in a flat bed without using bedrails?: None Help needed moving from lying on your back to sitting on the side of a flat bed without using bedrails?: None Help needed moving to and from a bed to a chair (including a  wheelchair)?: A Little Help needed standing up from a chair using your arms (e.g., wheelchair or bedside chair)?: A Little Help needed to walk in hospital room?: A Little Help needed climbing 3-5 steps with a railing? : A Lot 6 Click Score: 19    End of Session Equipment Utilized During Treatment: Gait belt Activity Tolerance: Patient limited by pain Patient left: in bed;with call bell/phone within reach;with bed alarm set Nurse Communication: Mobility status PT Visit Diagnosis: Unsteadiness on feet (R26.81);Pain Pain - Right/Left: Left Pain - part of body: Ankle and joints of foot     Time: 2956-2130 PT Time Calculation (min) (ACUTE ONLY): 37 min  Charges:  $Gait Training: 8-22 mins $Therapeutic Activity: 8-22 mins    Ivar Drape 01/22/2021, 7:59 PM  Samul Dada, PT PhD Acute Rehab Dept. Number: [facility] [id number] and [facility] [phone]

## 2021-01-23 MED ORDER — LIDOCAINE 5 % EX PTCH: 1.0000 | MEDICATED_PATCH | CUTANEOUS | 0 refills | Status: DC

## 2021-01-23 MED ORDER — GABAPENTIN 400 MG PO CAPS
400.0000 mg | ORAL_CAPSULE | Freq: Three times a day (TID) | ORAL | 0 refills | Status: DC
  Filled 2021-01-23: qty 90, 30d supply, fill #0

## 2021-01-23 MED ORDER — LIDOCAINE 5 % EX PTCH
1.0000 | MEDICATED_PATCH | CUTANEOUS | Status: DC
  Administered 2021-01-23 – 2021-02-17 (×19): 1 via TRANSDERMAL
  Filled 2021-01-23 (×20): qty 1

## 2021-01-23 MED ORDER — TRAZODONE HCL 50 MG PO TABS
50.0000 mg | ORAL_TABLET | Freq: Once | ORAL | Status: AC
  Administered 2021-01-23: 50 mg via ORAL

## 2021-01-23 MED ORDER — FOLIC ACID 1 MG PO TABS
1.0000 mg | ORAL_TABLET | Freq: Every day | ORAL | Status: DC
  Administered 2021-01-23 – 2021-05-14 (×111): 1 mg via ORAL
  Filled 2021-01-23 (×114): qty 1

## 2021-01-23 NOTE — Care Management (Signed)
°  °  Durable Medical Equipment  (From admission, onward)           Start     Ordered   01/23/21 1437  For home use only DME lightweight manual wheelchair with seat cushion  Once       Comments: Patient suffers from weakness which impairs their ability to perform daily activities like toileting in the home.  A cane will not resolve  issue with performing activities of daily living. A wheelchair will allow patient to safely perform daily activities. Patient is not able to propel themselves in the home using a standard weight wheelchair due to general weakness. Patient can self propel in the lightweight wheelchair. Length of need Lifetime. Accessories: elevating leg rests (ELRs), wheel locks, extensions and anti-tippers.   01/23/21 1437

## 2021-01-23 NOTE — TOC Progression Note (Addendum)
Transition of Care [facility]) - Progression Note    Patient Details  Name: Steve [name] MRN: [id number] Date of Birth: 10/08/1965  [facility] CM/SW [place], [place] Phone Number: 01/23/2021, 2:15 PM  Clinical Narrative:    CSW spoke with Donna Christen, Pt's son, who noted that at this time he is not able to offer a place for pt, and is not able to care for him. Pt and son do not have a good relationship. Son lives with 3 other people in a 2 bedroom apartment, and works out of town often. Discharging to son's will not be an option at this time. At this time TOC does not have any discharge options as he does not have a payor source for rehab and cannot return to sober living until he is ambulatory. Son noted pt has no other family that can assist. TOC will continue to follow.   MD requested CSW attempt to contact pt's son to follow up on if he is able to take pt home, as he was unsure yesterday. CSW has attempted multiple times to reach son, and is unable to leave a voicemail. TOC will continue to attempt to contact family for disposition.       Expected Discharge Plan and Services           Expected Discharge Date: 01/19/21                                     Social Determinants of Health (SDOH) Interventions    Readmission Risk Interventions No flowsheet data found.

## 2021-01-23 NOTE — Progress Notes (Signed)
PROGRESS NOTE  Steve [name]  DOB: 09-Feb-1965  PCP: No primary care provider on file. AQT:[id number]  DOA: 01/05/2021  LOS: 18 days  Hospital Day: 19  Chief Complaint  Patient presents with   Altered Mental Status   Seizures    Brief narrative: Steve [name] is a 55 y.o. male with history of major depression, anxiety who was in a sober living community for last 5 months. He was brought to the ED on 12/20 for convalescence, combativeness and altered sensorium.  Per collateral history, patient was not acting typical for last few days. 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  IV fluid resuscitation started Patient was admitted to ICU. 12/21, underwent LP which showed staph in CSF, felt to be contaminant.  EEG did not show any seizures 12/24, HD catheter was placed but patient did not require HD as creatinine started to improve 12/31, with clinical improvement, patient was transferred out of ICU to [name].  Subjective: C/o pins and needles from left knee down to foot  Assessment/Plan: Left leg/foot pain -Patient has swelling, pain and tenderness and decreased range of motion of left ankle.  X-ray and MRI unremarkable.  Orthopedic consult obtained.  No local structural pathology identified.  No new recommendation. -MRI lumbar spine was obtained. It shows small foraminal disc protrusion at multiple levels from T11-L5.  I discussed the case with neurosurgeon Dr. Lovell Sheehan this morning.  He stated that the MRI is very close to normal and is not the primary cause of his left ankle issues. -PTA, patient was on Neurontin 400 mg 3 times daily.  It was held on admission because of significant AKI.  Renal function has improved to normal now. -neurontin resumed 1/6-- monitor for improvement -? Peroneal nerve entrapment? similar  distribution of discomfort  Seizure -Generalized convulsion was noted by his roommate prior to presentation. -EEG unremarkable.  Neurology consult appreciated.  Currently on Vimpat 100 mg twice daily given renal failure and possibility of Keppra contributing to agitation.  Acute renal failure secondary to rhabdomyolysis Acute metabolic acidosis -His creatinine continue to worsen to peak at 7.48 on 12/26.  Nephrology consult was obtained.  He was prepped for dialysis, had a HD catheter placed but never needed dialysis.  -Creatinine gradually improved to normal.  Serum bicarb level improved as well. -Nephrology consult appreciated  Rhabdomyolysis -Primary cause of AKI.  CK level improved with IV fluid. -resolved   Uremic encephalopathy -Was combative on admission. Mental status improved with improvement in uremia. -Patient states he has history of major depression and anxiety.   -Currently he is on Prozac 40 mg daily, Seroquel 100 mg twice daily, BuSpar 10 mg twice daily, trazodone 50 mg nightly.    ?? Upper extremity DVT -Per critical care note, hemodialysis cath had a clot on it when it was removed.  Ultrasound duplex scan of upper extremities was obtained on 12/31 which did not show any evidence of DVT.  Because of his risk of falls, noncompliance, recurrent seizures, I would avoid long-term anticoagulation without definite evidence of DVT  Aspiration pneumonia -Completed Zosyn -Isolated fever 12/27-has been stable, no associated leukocytosis -No recurrence of the fever  Hypokalemia/hypomagnesemia -replete in AM on labs  Acute liver injury -His AST and ALT were significantly elevated to over 700s on admission -resolved  Evidence of right ventricular dysfunction and recent echocardiogram -Echo 1/2  with EF 60 to 65%.  Cannot rule out a small PFO. -Not clinically significant as patient has no symptoms related to it. Continue to monitor as an outpatient.  Mobility: Encourage  ambulation Living condition: from sober living community Goals of care:   Code Status: Full Code  Nutritional status: Body mass index is 22.02 kg/m.      Diet:  Diet Order             Diet general           Diet renal with fluid restriction Fluid restriction: Other (see comments); Room service appropriate? Yes; Fluid consistency: Thin  Diet effective now                  DVT prophylaxis:  heparin injection 5,000 Units Start: 01/16/21 1545    Consultants: Nephrology, PCCM, neurology, ID, ortho Family Communication: None at bedside  Status is: Inpatient  Level of care: Med-Surg   Dispo: The patient is from: Group home              Anticipated d/c is to: Group home versus home              Patient currently is not medically stable to d/c.   Difficult to place patient No     Infusions:   sodium chloride Stopped (01/08/21 2354)   lacosamide (VIMPAT) IV 100 mg (01/23/21 1047)    Scheduled Meds:  amLODipine  2.5 mg Oral Daily   aspirin  325 mg Oral Daily   Or   aspirin  300 mg Rectal Daily   busPIRone  10 mg Oral BID   docusate sodium  100 mg Oral BID   feeding supplement  237 mL Oral BID BM   FLUoxetine  40 mg Oral Daily   gabapentin  400 mg Oral TID   heparin injection (subcutaneous)  5,000 Units Subcutaneous Q8H   mouth rinse  15 mL Mouth Rinse BID   nicotine  21 mg Transdermal Daily   polyethylene glycol  17 g Oral Daily   QUEtiapine  100 mg Oral BID   sodium chloride flush  3 mL Intravenous Q12H   traZODone  50 mg Oral QHS    PRN meds: sodium chloride, albuterol, diphenhydrAMINE, docusate sodium, morphine injection, ondansetron (ZOFRAN) IV, polyethylene glycol, sodium chloride flush   Antimicrobials: Anti-infectives (From admission, onward)    Start     Dose/Rate Route Frequency Ordered Stop   01/07/21 1500  vancomycin (VANCOREADY) IVPB 750 mg/150 mL  Status:  Discontinued        750 mg 150 mL/hr over 60 Minutes Intravenous Every 24 hours  01/06/21 1326 01/07/21 0802   01/07/21 0802  vancomycin variable dose per unstable renal function (pharmacist dosing)  Status:  Discontinued         Does not apply See admin instructions 01/07/21 0802 01/07/21 1000   01/06/21 2200  cefTRIAXone (ROCEPHIN) 2 g in sodium chloride 0.9 % 100 mL IVPB  Status:  Discontinued        2 g 200 mL/hr over 30 Minutes Intravenous Every 12 hours 01/06/21 1635 01/07/21 1000   01/06/21 1600  ceFEPIme (MAXIPIME) 2 g in sodium chloride 0.9 % 100 mL IVPB  Status:  Discontinued        2 g 200 mL/hr over 30 Minutes Intravenous Every 24 hours 01/05/21 1612 01/05/21 1721   01/06/21 1415  vancomycin (VANCOREADY) IVPB 1500 mg/300 mL        1,500 mg 150  mL/hr over 120 Minutes Intravenous  Once 01/06/21 1326 01/06/21 1931   01/05/21 1845  Ampicillin-Sulbactam (UNASYN) 3 g in sodium chloride 0.9 % 100 mL IVPB  Status:  Discontinued        3 g 200 mL/hr over 30 Minutes Intravenous Every 12 hours 01/05/21 1754 01/06/21 1635   01/05/21 1645  vancomycin (VANCOCIN) IVPB 1000 mg/200 mL premix        1,000 mg 200 mL/hr over 60 Minutes Intravenous  Once 01/05/21 1513 01/05/21 1752   01/05/21 1612  vancomycin variable dose per unstable renal function (pharmacist dosing)  Status:  Discontinued         Does not apply See admin instructions 01/05/21 1612 01/05/21 1721   01/05/21 1515  ceFEPIme (MAXIPIME) 2 g in sodium chloride 0.9 % 100 mL IVPB        2 g 200 mL/hr over 30 Minutes Intravenous  Once 01/05/21 1513 01/05/21 1648       Objective: Vitals:   01/23/21 0346 01/23/21 0842  BP: (!) 142/86 (!) 147/88  Pulse: 74 66  Resp: 20 17  Temp: 98.7 F (37.1 C) 99.2 F (37.3 C)  SpO2: 98% 99%    Intake/Output Summary (Last 24 hours) at 01/23/2021 1049 Last data filed at 01/23/2021 0600 Gross per 24 hour  Intake --  Output 100 ml  Net -100 ml    Filed Weights   01/17/21 0512 01/21/21 0546 01/23/21 0455  Weight: 77.4 kg 69.5 kg 65.7 kg   Weight change:  Body mass  index is 22.02 kg/m.   Physical Exam:  General: Appearance:    frail male in no acute distress     Lungs:     respirations unlabored  Heart:    Normal heart rate.   MS:   All extremities are intact.    Neurologic:   Awake, alert, oriented x 3. No apparent focal neurological           defect.      Data Review: I have personally reviewed the laboratory data and studies available.     Signed, Joseph Art, DO [facility] 01/23/2021

## 2021-01-23 NOTE — Discharge Summary (Incomplete)
Physician Discharge Summary  Steve [name] [id number] DOB: 05-25-1965 DOA: 01/05/2021  PCP: No primary care provider on file.  Admit date: 01/05/2021 Discharge date: 01/23/2021  Admitted From: sober living Discharge disposition: tbd- TOC working on safe d/c plan-- needs supervision by family   Recommendations for Outpatient Follow-Up:   BMP 1 week Outpatient follow up with neuro for EMG on left leg Home health   Discharge Diagnosis:   Principal Problem:   Acute metabolic encephalopathy Active Problems:   Severe sepsis without septic shock ([place])   Fever   Pressure injury of skin    Discharge Condition: Improved.  Diet recommendation: Low sodium, heart healthy  Wound care: None.  Code status: Full.   History of Present Illness:   Steve [name] is a 56 y.o. male with history of major depression, anxiety who was in a sober living community for last 5 months. He was brought to the ED on 12/20 for convalescence, combativeness and altered sensorium.  Per collateral history, patient was not acting typical for last few days. 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  IV fluid resuscitation started Patient was admitted to ICU. 12/21, underwent LP which showed staph in CSF, felt to be contaminant.  EEG did not show any seizures 12/24, HD catheter was placed but patient did not require HD as creatinine started to improve 12/31, with clinical improvement, patient was transferred out of ICU to [facility].   Hospital Course by Problem:   Left leg/foot pain -Patient has swelling, pain and tenderness and decreased range of motion of left ankle.  X-ray and MRI unremarkable.  Orthopedic consult obtained.  No local structural pathology identified.  No new recommendation. -MRI lumbar spine was obtained. It  shows small foraminal disc protrusion at multiple levels from T11-L5.  I discussed the case with neurosurgeon Dr. Arnoldo Morale this morning.  He stated that the MRI is very close to normal and is not the primary cause of his left ankle issues. -PTA, patient was on Neurontin 400 mg 3 times daily.  It was held on admission because of significant AKI.  Renal function has improved to normal now. -neurontin resumed 1/6-- monitor for improvement -? Peroneal nerve entrapment? similar distribution of discomfort- outpatient EEG -exam inconsistant:   Seizure -Generalized convulsion was noted by his roommate prior to presentation. -EEG unremarkable.  Neurology consult appreciated.  Currently on Vimpat 100 mg twice daily given renal failure and possibility of Keppra contributing to agitation.   Acute renal failure secondary to rhabdomyolysis Acute metabolic acidosis -His creatinine continue to worsen to peak at 7.48 on 12/26.  Nephrology consult was obtained.  He was prepped for dialysis, had a HD catheter placed but never needed dialysis.  -Creatinine gradually improved to normal.  Serum bicarb level improved as well. -Nephrology consult appreciated   Rhabdomyolysis -Primary cause of AKI.  CK level improved with IV fluid. -resolved   Uremic encephalopathy -Was combative on admission. Mental status improved with improvement in uremia. -Patient states he has history of major depression and anxiety.   -Currently he is on Prozac 40 mg daily, Seroquel 100 mg twice daily, BuSpar 10 mg twice daily, trazodone 50 mg nightly.     ?? Upper extremity DVT -Per critical care note, hemodialysis cath had a clot on it when it was removed.  Ultrasound duplex scan of upper  extremities was obtained on 12/31 which did not show any evidence of DVT.  Because of his risk of falls, noncompliance, recurrent seizures, I would avoid long-term anticoagulation without definite evidence of DVT   Aspiration pneumonia -Completed  Zosyn -Isolated fever 12/27-has been stable, no associated leukocytosis -No recurrence of the fever   Hypokalemia/hypomagnesemia -replete in AM on labs   Acute liver injury -His AST and ALT were significantly elevated to over 700s on admission -resolved   Evidence of right ventricular dysfunction and recent echocardiogram -Echo 1/2 with EF 60 to 65%.  Cannot rule out a small PFO. -Not clinically significant as patient has no symptoms related to it. Continue to monitor as an outpatient.    Medical Consultants:      Discharge Exam:   Vitals:   01/23/21 0842 01/23/21 1147  BP: (!) 147/88 (!) 144/84  Pulse: 66 83  Resp: 17 16  Temp: 99.2 F (37.3 C) 98 F (36.7 C)  SpO2: 99% 99%   Vitals:   01/23/21 0346 01/23/21 0455 01/23/21 0842 01/23/21 1147  BP: (!) 142/86  (!) 147/88 (!) 144/84  Pulse: 74  66 83  Resp: 20  17 16   Temp: 98.7 F (37.1 C)  99.2 F (37.3 C) 98 F (36.7 C)  TempSrc: Oral  Oral Oral  SpO2: 98%  99% 99%  Weight:  65.7 kg    Height:        General exam: Appears calm and comfortable. *** Respiratory system: Clear to auscultation. Respiratory effort normal. Cardiovascular system: S1 & S2 heard, RRR. No JVD,  rubs, gallops or clicks. No murmurs. Gastrointestinal system: Abdomen is nondistended, soft and nontender. No organomegaly or masses felt. Normal bowel sounds heard. Central nervous system: Alert and oriented. No focal neurological deficits. Extremities: No clubbing,  or cyanosis. No edema. Skin: No rashes, lesions or ulcers. Psychiatry: Judgement and insight appear normal. Mood & affect appropriate.    The results of significant diagnostics from this hospitalization (including imaging, microbiology, ancillary and laboratory) are listed below for reference.     Procedures and Diagnostic Studies:   CT HEAD WO CONTRAST (5MM)  Result Date: 01/05/2021 CLINICAL DATA:  Mental status change, unknown cause EXAM: CT HEAD WITHOUT CONTRAST  TECHNIQUE: Contiguous axial images were obtained from the base of the skull through the vertex without intravenous contrast. COMPARISON:  None. FINDINGS: Brain: No evidence of acute infarction, hemorrhage, or hydrocephalus. Prominent fluid space at the left cerebellopontine angle measuring approximately 3 x 2 x 2 cm which appears to have mild mass effect upon the adjacent cerebellar hemisphere. Space appears to have internal density near that of CSF, although evaluation is slightly limited given artifact from the adjacent skull base. Vascular: No hyperdense vessel or unexpected calcification. Skull: Normal. Negative for fracture or focal lesion. Sinuses/Orbits: No acute finding. Other: None. IMPRESSION: 1. No acute intracranial findings. 2. 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 Correlation with prior outside imaging of the brain is recommended to assess stability. If no prior imaging is available, a follow-up contrast enhanced MRI could be performed on a nonemergent basis. Electronically Signed   By: Davina Poke D.O.   On: 01/05/2021 14:39   MR BRAIN WO CONTRAST  Result Date: 01/06/2021 CLINICAL DATA:  Altered mental status, found down EXAM: MRI HEAD WITHOUT CONTRAST TECHNIQUE: Multiplanar, multiecho pulse sequences of the brain and surrounding structures were obtained without intravenous contrast. COMPARISON:  CT head obtained 1 day  prior FINDINGS: Brain: There is a punctate focus of diffusion restriction in the right lentiform nucleus with associated T2/FLAIR signal abnormality consistent with a tiny acute infarct. There is no other evidence of acute infarct. There is no acute intracranial hemorrhage or extra-axial fluid collection. Parenchymal volume is normal. The ventricles are normal in size. There are scattered small foci of FLAIR signal abnormality in the subcortical and periventricular white matter likely  reflecting mild chronic white matter microangiopathy. 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 There is no solid mass lesion. There is no solid mass lesion or evidence of epidermoid cyst in the prominent CSF space in the left cerebellopontine angle, suggesting a benign arachnoid cyst. There is no significant mass effect on the cerebellum. There is no midline shift. Vascular: Normal flow voids. Skull and upper cervical spine: Normal marrow signal. Sinuses/Orbits: There is mild mucosal thickening in the paranasal sinuses. The globes and orbits are unremarkable. Other: None. IMPRESSION: 1. 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 2. Mild chronic white matter microangiopathy. 3. 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 Electronically Signed   By: Valetta Mole M.D.   On: 01/06/2021 15:23   US RENAL  Result Date: 01/06/2021 CLINICAL DATA:  Acute kidney injury EXAM: RENAL / URINARY TRACT ULTRASOUND COMPLETE COMPARISON:  None. FINDINGS: Right Kidney: Renal measurements: 10.5 x 6.4 x 5.8 cm = volume: 204 mL. Echogenicity within normal limits. No mass or hydronephrosis visualized. Left Kidney: Renal measurements: 11.0 x 7.1 x 6.4 cm = volume: 263 mL. Echogenicity within normal limits. No mass or hydronephrosis visualized. Bladder: Appears normal for degree of bladder distention. Other: None. IMPRESSION: No acute findings.  No hydronephrosis. Electronically Signed   By: Rolm Baptise M.D.   On: 01/06/2021 12:20   DG Chest Port 1 View  Result Date: 01/06/2021 CLINICAL DATA:  Pneumonia EXAM: PORTABLE CHEST 1 VIEW COMPARISON:  01/05/2021 FINDINGS: Heart and mediastinal contours are within normal limits. No focal opacities or effusions. No acute bony abnormality. IMPRESSION: No active disease. Electronically Signed   By: Rolm Baptise M.D.   On: 01/06/2021 12:16   DG Chest Port 1 View  Result Date:  01/05/2021 CLINICAL DATA:  Questionable sepsis EXAM: PORTABLE CHEST 1 VIEW COMPARISON:  None. FINDINGS: Normal heart size. Normal mediastinal contour. No pneumothorax. No pleural effusion. No overt pulmonary edema. Mild hazy bibasilar lung opacities. IMPRESSION: Mild hazy bibasilar lung opacities, which could represent aspiration or atelectasis. Electronically Signed   By: Ilona Sorrel M.D.   On: 01/05/2021 14:57   DG Abd Portable 1V  Result Date: 01/06/2021 CLINICAL DATA:  Pneumonia, abdominal pain EXAM: PORTABLE ABDOMEN - 1 VIEW COMPARISON:  None. FINDINGS: The bowel gas pattern is normal. No radio-opaque calculi or other significant radiographic abnormality are seen. IMPRESSION: Negative. Electronically Signed   By: Rolm Baptise M.D.   On: 01/06/2021 12:17   EEG adult  Result Date: 01/06/2021 Lora Havens, MD     01/06/2021  9:12 AM Patient Name: Jaquarion Spofford MRN: [id number] Epilepsy Attending: Lora Havens Referring Physician/Provider: Salvadore Dom, NP Date: 01/06/2021 Duration: 22.57 mins Patient history: 56 yo male who presents from a group home after witnessed generalized seizure activity with combativeness afterward.  EEG to evaluate for seizure. Level of alertness: Awake AEDs during EEG study: LEV Technical aspects: This EEG study was done with scalp electrodes positioned according to the 10-20 International system of electrode placement.  Electrical activity was acquired at a sampling rate of 500Hz  and reviewed with a high frequency filter of 70Hz  and a low frequency filter of 1Hz . EEG data were recorded continuously and digitally stored. Description: No posterior dominant rhythm was seen.  EEG showed continuous generalized 3 to 5 Hz theta-delta slowing.  Hyperventilation and photic stimulation were not performed.   ABNORMALITY - Continuous slow, generalized IMPRESSION: This study is suggestive of moderate diffuse encephalopathy, nonspecific etiology. No seizures or epileptiform  discharges were seen throughout the recording. Lora Havens   ECHOCARDIOGRAM COMPLETE  Result Date: 01/06/2021    ECHOCARDIOGRAM REPORT   Patient Name:   TAYLAN Speyer Date of Exam: 01/06/2021 Medical Rec #:  [id number]    Height:       68.0 in Accession #:    [id number]   Weight:       148.8 lb Date of Birth:  [birth year]   BSA:          1.802 m Patient Age:    58 years     BP:           118/79 mmHg Patient Gender: M            HR:           61 bpm. Exam Location:  Inpatient Procedure: 2D Echo Indications:    bacterial meningitis  History:        Patient has no prior history of Echocardiogram examinations.  Sonographer:    Johny Chess RDCS Referring Phys: [id number] [place]  Sonographer Comments: Image acquisition challenging due to respiratory motion. IMPRESSIONS  1. Left ventricular ejection fraction, by estimation, is 55 to 60%. The left ventricle has normal function. The left ventricle has no regional wall motion abnormalities. There is mild concentric left ventricular hypertrophy. Left ventricular diastolic parameters were normal. There is the interventricular septum is flattened in diastole ('D' shaped left ventricle), consistent with right ventricular volume overload.  2. Right ventricular systolic function is moderately reduced. The right ventricular size is moderately enlarged. There is normal pulmonary artery systolic pressure.  3. The mitral valve is normal in structure. Trivial mitral valve regurgitation.  4. Tricuspid valve regurgitation is moderate.  5. The aortic valve is tricuspid. Aortic valve regurgitation is not visualized. No aortic stenosis is present.  6. There is rapid and restrictive y descent in the hepatic veins and there is diastolic flow reversal (taht may be occurring in expiration) - consider constrictive physiology. The inferior vena cava is dilated in size with <50% respiratory variability, suggesting right atrial pressure of 15 mmHg.  7. Evidence of atrial level  shunting detected by color flow Doppler. There is a small secundum atrial septal defect with (probably) bidirectional shunting across the atrial septum. Conclusion(s)/Recommendation(s): Consider cardiac CT or MRI if there is a suspicion for constrictive pericarditis. Consider TEE to evaluate for ASD, but neither of these issues is likely to be an urgent clinical question. FINDINGS  Left Ventricle: There is prominent respiratory displacement of the ventricular septum with respiration, suggesting possible constrictive physiology, but this can also be seen with increased work of breathing (asthma or COPD exacerbation) or the Mueller maneuver (grunting, straining). Left ventricular ejection fraction, by estimation, is 55 to 60%. The left ventricle has normal function. The left ventricle has no regional wall motion abnormalities. The left ventricular internal cavity size was normal in  size. There is mild concentric left ventricular hypertrophy. The interventricular septum is flattened in diastole ('D' shaped left ventricle),  consistent with right ventricular volume overload. Left ventricular diastolic parameters were normal. Normal left ventricular filling pressure. Right Ventricle: The right ventricular size is moderately enlarged. No increase in right ventricular wall thickness. Right ventricular systolic function is moderately reduced. There is normal pulmonary artery systolic pressure. The tricuspid regurgitant velocity is 2.06 m/s, and with an assumed right atrial pressure of 15 mmHg, the estimated right ventricular systolic pressure is [id number]. Left Atrium: Left atrial size was normal in size. Right Atrium: Right atrial size was normal in size. Pericardium: There is no evidence of pericardial effusion. Mitral Valve: The mitral valve is normal in structure. Trivial mitral valve regurgitation. Tricuspid Valve: The tricuspid valve is normal in structure. Tricuspid valve regurgitation is moderate. Aortic Valve: The  aortic valve is tricuspid. Aortic valve regurgitation is not visualized. No aortic stenosis is present. Pulmonic Valve: The pulmonic valve was normal in structure. Pulmonic valve regurgitation is not visualized. Aorta: The aortic root and ascending aorta are structurally normal, with no evidence of dilitation. Venous: There is rapid and restrictive y descent in the hepatic veins and there is diastolic flow reversal (taht may be occurring in expiration) - consider constrictive physiology. The inferior vena cava is dilated in size with less than 50% respiratory variability, suggesting right atrial pressure of 15 mmHg. IAS/Shunts: Evidence of atrial level shunting detected by color flow Doppler. There is a small secundum atrial septal defect with bidirectional shunting across the atrial septum.  LEFT VENTRICLE PLAX 2D LVIDd:         4.60 cm     Diastology LVIDs:         3.50 cm     LV e' medial:    7.07 cm/s LV PW:         1.20 cm     LV E/e' medial:  10.3 LV IVS:        1.20 cm     LV e' lateral:   8.05 cm/s LVOT diam:     2.10 cm     LV E/e' lateral: 9.0 LV SV:         60 LV SV Index:   33 LVOT Area:     3.46 cm  LV Volumes (MOD) LV vol d, MOD A2C: 88.3 ml LV vol d, MOD A4C: 88.3 ml LV vol s, MOD A2C: 51.4 ml LV vol s, MOD A4C: 48.8 ml LV SV MOD A2C:     36.9 ml LV SV MOD A4C:     88.3 ml LV SV MOD BP:      39.4 ml RIGHT VENTRICLE            IVC RV S prime:     8.49 cm/s  IVC diam: 2.50 cm TAPSE (M-mode): 1.7 cm LEFT ATRIUM             Index        RIGHT ATRIUM           Index LA diam:        3.40 cm 1.89 cm/m   RA Area:     16.20 cm LA Vol (A2C):   50.6 ml 28.07 ml/m  RA Volume:   39.10 ml  21.69 ml/m LA Vol (A4C):   52.8 ml 29.29 ml/m LA Biplane Vol: 54.2 ml 30.07 ml/m  AORTIC VALVE LVOT Vmax:   80.10 cm/s LVOT Vmean:  51.400 cm/s LVOT VTI:    0.172 m  AORTA Ao Root diam: 3.10 cm Ao Asc diam:  3.80 cm  MITRAL VALVE               TRICUSPID VALVE MV Area (PHT): 3.60 cm    TR Peak grad:   17.0 mmHg MV Decel  Time: 211 msec    TR Vmax:        206.00 cm/s MV E velocity: 72.80 cm/s MV A velocity: 61.70 cm/s  SHUNTS MV E/A ratio:  1.18        Systemic VTI:  0.17 m                            Systemic Diam: 2.10 cm Mihai Croitoru MD Electronically signed by Sanda Klein MD Signature Date/Time: 01/06/2021/5:34:22 PM    Final      Labs:   Basic Metabolic Panel: Recent Labs  Lab 01/17/21 0141 01/18/21 0213 01/19/21 0158 01/21/21 0241  NA 142 142 141 137  K 3.5 3.8 3.5 3.4*  CL 108 108 112* 110  CO2 27 26 23 22   GLUCOSE 98 109* 83 94  BUN 27* 19 16 11   CREATININE 2.98* 2.17* 1.60* 1.18  CALCIUM 7.8* 8.2* 7.8* 8.6*  MG 2.3  --   --   --   PHOS 3.6  --   --   --    GFR Estimated Creatinine Clearance: 65.7 mL/min (by C-G formula based on SCr of 1.18 mg/dL). Liver Function Tests: Recent Labs  Lab 01/21/21 0241  AST 28  ALT 48*  ALKPHOS 42  BILITOT 0.8  PROT 6.6  ALBUMIN 3.0*   No results for input(s): LIPASE, AMYLASE in the last 168 hours. Recent Labs  Lab 01/17/21 0141  AMMONIA 28   Coagulation profile No results for input(s): INR, PROTIME in the last 168 hours.  CBC: Recent Labs  Lab 01/17/21 0141 01/18/21 0213 01/19/21 0158 01/21/21 0241  WBC 6.9 6.6 6.3 5.6  NEUTROABS 3.9 3.3 2.5 2.3  HGB 9.5* 9.5* 9.3* 9.4*  HCT 27.8* 28.0* 27.1* 28.1*  MCV 95.2 96.9 95.8 95.6  PLT 262 301 325 448*   Cardiac Enzymes: Recent Labs  Lab 01/17/21 0141 01/18/21 0213 01/19/21 0158  CKTOTAL 1,023* 611* 376   BNP: Invalid input(s): POCBNP CBG: No results for input(s): GLUCAP in the last 168 hours. D-Dimer No results for input(s): DDIMER in the last 72 hours. Hgb A1c No results for input(s): HGBA1C in the last 72 hours. Lipid Profile No results for input(s): CHOL, HDL, LDLCALC, TRIG, CHOLHDL, LDLDIRECT in the last 72 hours. Thyroid function studies No results for input(s): TSH, T4TOTAL, T3FREE, THYROIDAB in the last 72 hours.  Invalid input(s): FREET3 Anemia work up No  results for input(s): VITAMINB12, FOLATE, FERRITIN, TIBC, IRON, RETICCTPCT in the last 72 hours. Microbiology No results found for this or any previous visit (from the past 240 hour(s)).   Discharge Instructions:   Discharge Instructions     Call MD for:  persistant dizziness or light-headedness   Complete by: As directed    Call MD for:  persistant nausea and vomiting   Complete by: As directed    Call MD for:  severe uncontrolled pain   Complete by: As directed    Call MD for:  temperature >100.4   Complete by: As directed    Diet general   Complete by: As directed    Discharge instructions   Complete by: As directed    Seizure precautions: -Per [facility] statutes, patients with seizures are not allowed to drive  until they have been seizure-free for six months.  -Use caution when using heavy equipment or power tools.  -Avoid working on ladders or at heights.  -Take showers instead of baths. Ensure the water temperature is not too high on the home water heater.  -Do not go swimming alone.  -Do not lock yourself in a room alone (i.e. bathroom). -When caring for infants or small children, sit down when holding, feeding, or changing them to minimize risk of injury to the child in the event you have a seizure.  -Maintain good sleep hygiene. -Avoid alcohol.    If patient has another seizure, call 911 and bring them back to the ED if: A.  The seizure lasts longer than 5 minutes.      B.  The patient doesn't wake shortly after the seizure or has new problems such as difficulty seeing, speaking or moving following the seizure C.  The patient was injured during the seizure D.  The patient has a temperature over 102 F (39C) E.  The patient vomited during the seizure and now is having trouble breathing   Discharge wound care:   Complete by: As directed    Increase activity slowly   Complete by: As directed    No wound care   Complete by: As directed       Allergies as of  01/23/2021   Not on File      Medication List     STOP taking these medications    hydrOXYzine 50 MG tablet Commonly known as: ATARAX       TAKE these medications    amLODipine 2.5 MG tablet Commonly known as: NORVASC Take 1 tablet (2.5 mg total) by mouth daily.   busPIRone 10 MG tablet Commonly known as: BUSPAR Take 1 tablet (10 mg total) by mouth 2 (two) times daily.   FLUoxetine 20 MG capsule Commonly known as: PROZAC Take 2 capsules (40 mg total) by mouth daily.   gabapentin 400 MG capsule Commonly known as: NEURONTIN Take 1 capsule (400 mg total) by mouth 3 (three) times daily.   Lacosamide 100 MG Tabs Commonly known as: Vimpat Take 1 tablet (100 mg total) by mouth 2 (two) times daily.   lidocaine 5 % Commonly known as: LIDODERM Place 1 patch onto the skin daily. Remove & Discard patch within 12 hours or as directed by MD Start taking on: January 24, 2021   oxyCODONE-acetaminophen 5-325 MG tablet Commonly known as: Percocet Take 1 tablet by mouth every 6 (six) hours as needed for up to 5 days for severe pain.   QUEtiapine 100 MG tablet Commonly known as: SEROQUEL Take 1 tablet (100 mg total) by mouth 2 (two) times daily.   traZODone 50 MG tablet Commonly known as: DESYREL Take 1 tablet (50 mg total) by mouth at bedtime as needed for sleep.               Durable Medical Equipment  (From admission, onward)           Start     Ordered   01/23/21 1437  For home use only DME lightweight manual wheelchair with seat cushion  Once       Comments: Patient suffers from weakness which impairs their ability to perform daily activities like toileting in the home.  A cane will not resolve  issue with performing activities of daily living. A wheelchair will allow patient to safely perform daily activities. Patient is not able to propel themselves in  the home using a standard weight wheelchair due to general weakness. Patient can self propel in the  lightweight wheelchair. Length of need Lifetime. Accessories: elevating leg rests (ELRs), wheel locks, extensions and anti-tippers.   01/23/21 1437              Discharge Care Instructions  (From admission, onward)           Start     Ordered   01/19/21 0000  Discharge wound care:        01/19/21 1443            [place] Follow up.   Contact information: [place] [phone] [place] .   Contact information: [address]     [place] [phone]                 Time coordinating discharge: 35 min  Signed:  Geradine Girt DO  [facility] 01/23/2021, 2:41 PM

## 2021-01-23 NOTE — Progress Notes (Signed)
OT Cancellation Note  Patient Details Name: Samel Bruna MRN: [id number] DOB: 1965-05-16   Cancelled Treatment:    Reason Eval/Treat Not Completed: Pain limiting ability to participate.  Pt. Seen for attempted skilled OT treatment session.  Pt. Declining all stated options including bed level options secondary to reports of L ankle/foot pain.  Pt. States "okay" to try back another day but declines for today.   Alessandra Bevels Lorraine-COTA/L 01/23/2021, 12:17 PM

## 2021-01-24 LAB — CBC
HCT: 28.5 % — ABNORMAL LOW (ref 39.0–52.0)
Hemoglobin: 9.7 g/dL — ABNORMAL LOW (ref 13.0–17.0)
MCH: 32.2 pg (ref 26.0–34.0)
MCHC: 34 g/dL (ref 30.0–36.0)
MCV: 94.7 fL (ref 80.0–100.0)
Platelets: 581 10*3/uL — ABNORMAL HIGH (ref 150–400)
RBC: 3.01 MIL/uL — ABNORMAL LOW (ref 4.22–5.81)
RDW: 13.9 % (ref 11.5–15.5)
WBC: 5.9 10*3/uL (ref 4.0–10.5)
nRBC: 0 % (ref 0.0–0.2)

## 2021-01-24 LAB — BASIC METABOLIC PANEL
Anion gap: 8 (ref 5–15)
BUN: 16 mg/dL (ref 6–20)
CO2: 24 mmol/L (ref 22–32)
Calcium: 9.2 mg/dL (ref 8.9–10.3)
Chloride: 106 mmol/L (ref 98–111)
Creatinine, Ser: 0.85 mg/dL (ref 0.61–1.24)
GFR, Estimated: >60 mL/min (ref >60)
Glucose, Bld: 91 mg/dL (ref 70–99)
Potassium: 2.9 mmol/L — ABNORMAL LOW (ref 3.5–5.1)
Sodium: 138 mmol/L (ref 135–145)

## 2021-01-24 LAB — MAGNESIUM: Magnesium: 2 mg/dL (ref 1.7–2.4)

## 2021-01-24 LAB — VITAMIN B12: Vitamin B-12: 500 pg/mL (ref 180–914)

## 2021-01-24 MED ORDER — POTASSIUM CHLORIDE CRYS ER 20 MEQ PO TBCR
40.0000 meq | EXTENDED_RELEASE_TABLET | ORAL | Status: AC
  Administered 2021-01-24 (×3): 40 meq via ORAL
  Filled 2021-01-24 (×3): qty 2

## 2021-01-24 MED ORDER — GABAPENTIN 300 MG PO CAPS
600.0000 mg | ORAL_CAPSULE | Freq: Three times a day (TID) | ORAL | Status: DC
  Administered 2021-01-24 – 2021-03-09 (×130): 600 mg via ORAL
  Filled 2021-01-24 (×130): qty 2

## 2021-01-24 NOTE — Progress Notes (Signed)
PROGRESS NOTE  Steve [name]  DOB: 1965/02/08  PCP: No primary care provider on file. [id number]  DOA: 01/05/2021  LOS: 19 days  Hospital Day: 20  Chief Complaint  Patient presents with   Altered Mental Status   Seizures    Brief narrative: Steve [name] is a 56 y.o. male with history of major depression, anxiety who was in a sober living community for last 5 months. He was brought to the ED on 12/20 for convalescence, combativeness and altered sensorium.  Per collateral history, patient was not acting typical for last few days. 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  IV fluid resuscitation started Patient was admitted to ICU. 12/21, underwent LP which showed staph in CSF, felt to be contaminant.  EEG did not show any seizures 12/24, HD catheter was placed but patient did not require HD as creatinine started to improve 12/31, with clinical improvement, patient was transferred out of ICU to [facility]. Currently having issues with left LE "pain"- unable to return to prior living environment until able to ambulate, son can not/will not take home   Subjective: Now c/o pain on interior ankle/calf (legs crossed when I walked into room)- "pain different than yesterday"  Assessment/Plan: Left leg/foot pain -Patient has swelling, pain and tenderness and decreased range of motion of left ankle.  X-ray and MRI unremarkable.  Orthopedic consult obtained.  No local structural pathology identified.  No new recommendation. -MRI lumbar spine was obtained. It shows small foraminal disc protrusion at multiple levels from T11-L5.  I discussed the case with neurosurgeon Dr. Lovell Sheehan this morning.  He stated that the MRI is very close to normal and is not the primary cause of his left ankle issues. -PTA, patient was on Neurontin 400  mg 3 times daily.  It was held on admission because of significant AKI.  Renal function has improved to normal now. -neurontin resumed 1/6-- increased dose 1/8 -? Functional component to inability to walk as pain changes and is not consistent with exam  Seizure -Generalized convulsion was noted by his roommate prior to presentation. -EEG unremarkable.  Neurology consult appreciated.  Currently on Vimpat 100 mg twice daily given renal failure and possibility of Keppra contributing to agitation.  Acute renal failure secondary to rhabdomyolysis Acute metabolic acidosis -His creatinine continue to worsen to peak at 7.48 on 12/26.  Nephrology consult was obtained.  He was prepped for dialysis, had a HD catheter placed but never needed dialysis.  -Creatinine gradually improved to normal.  Serum bicarb level improved as well. -Nephrology consult appreciated  Rhabdomyolysis -Primary cause of AKI.  CK level improved with IV fluid. -resolved   Uremic encephalopathy -Was combative on admission. Mental status improved with improvement in uremia. -Patient states he has history of major depression and anxiety.   -Currently he is on Prozac 40 mg daily, Seroquel 100 mg twice daily, BuSpar 10 mg twice daily, trazodone 50 mg nightly.    ?? Upper extremity DVT -Per critical care note, hemodialysis cath had a clot on it when it was removed.  Ultrasound duplex scan of upper extremities was obtained on 12/31 which did not show any evidence of DVT.  Because of his risk of falls, noncompliance, recurrent seizures, I would avoid long-term anticoagulation without definite evidence of DVT  Aspiration pneumonia -Completed Zosyn -Isolated fever 12/27-has been stable, no associated leukocytosis -  No recurrence of the fever  Hypokalemia/hypomagnesemia -replete K -BMP in AM  Acute liver injury -His AST and ALT were significantly elevated to over 700s on admission -resolved  Evidence of right ventricular  dysfunction and recent echocardiogram -Echo 1/2 with EF 60 to 65%.  Cannot rule out a small PFO. -Not clinically significant as patient has no symptoms related to it. Continue to monitor as an outpatient.  Mobility: Encourage ambulation Living condition: from sober living community Goals of care:   Code Status: Full Code  Nutritional status: Body mass index is 21.69 kg/m.      Diet:  Diet Order             Diet general           Diet renal with fluid restriction Fluid restriction: Other (see comments); Room service appropriate? Yes; Fluid consistency: Thin  Diet effective now                  DVT prophylaxis:  heparin injection 5,000 Units Start: 01/16/21 1545    Consultants: Nephrology, PCCM, neurology, ID, ortho Family Communication: None at bedside  Status is: Inpatient  Level of care: Med-Surg   Dispo: The patient is from: sober living              Anticipated d/c is to: unclear              Patient currently is not medically stable to d/c. Due to no safe d/c        Infusions:   sodium chloride Stopped (01/08/21 2354)   lacosamide (VIMPAT) IV 100 mg (01/24/21 1206)    Scheduled Meds:  amLODipine  2.5 mg Oral Daily   aspirin  325 mg Oral Daily   Or   aspirin  300 mg Rectal Daily   busPIRone  10 mg Oral BID   docusate sodium  100 mg Oral BID   feeding supplement  237 mL Oral BID BM   FLUoxetine  40 mg Oral Daily   folic acid  1 mg Oral Daily   gabapentin  400 mg Oral TID   heparin injection (subcutaneous)  5,000 Units Subcutaneous Q8H   lidocaine  1 patch Transdermal Q24H   mouth rinse  15 mL Mouth Rinse BID   nicotine  21 mg Transdermal Daily   polyethylene glycol  17 g Oral Daily   potassium chloride  40 mEq Oral Q2H   QUEtiapine  100 mg Oral BID   sodium chloride flush  3 mL Intravenous Q12H   traZODone  50 mg Oral QHS    PRN meds: sodium chloride, albuterol, diphenhydrAMINE, docusate sodium, ondansetron (ZOFRAN) IV, polyethylene glycol,  sodium chloride flush   Antimicrobials: Anti-infectives (From admission, onward)    Start     Dose/Rate Route Frequency Ordered Stop   01/07/21 1500  vancomycin (VANCOREADY) IVPB 750 mg/150 mL  Status:  Discontinued        750 mg 150 mL/hr over 60 Minutes Intravenous Every 24 hours 01/06/21 1326 01/07/21 0802   01/07/21 0802  vancomycin variable dose per unstable renal function (pharmacist dosing)  Status:  Discontinued         Does not apply See admin instructions 01/07/21 0802 01/07/21 1000   01/06/21 2200  cefTRIAXone (ROCEPHIN) 2 g in sodium chloride 0.9 % 100 mL IVPB  Status:  Discontinued        2 g 200 mL/hr over 30 Minutes Intravenous Every 12 hours 01/06/21 1635 01/07/21 1000  01/06/21 1600  ceFEPIme (MAXIPIME) 2 g in sodium chloride 0.9 % 100 mL IVPB  Status:  Discontinued        2 g 200 mL/hr over 30 Minutes Intravenous Every 24 hours 01/05/21 1612 01/05/21 1721   01/06/21 1415  vancomycin (VANCOREADY) IVPB 1500 mg/300 mL        1,500 mg 150 mL/hr over 120 Minutes Intravenous  Once 01/06/21 1326 01/06/21 1931   01/05/21 1845  Ampicillin-Sulbactam (UNASYN) 3 g in sodium chloride 0.9 % 100 mL IVPB  Status:  Discontinued        3 g 200 mL/hr over 30 Minutes Intravenous Every 12 hours 01/05/21 1754 01/06/21 1635   01/05/21 1645  vancomycin (VANCOCIN) IVPB 1000 mg/200 mL premix        1,000 mg 200 mL/hr over 60 Minutes Intravenous  Once 01/05/21 1513 01/05/21 1752   01/05/21 1612  vancomycin variable dose per unstable renal function (pharmacist dosing)  Status:  Discontinued         Does not apply See admin instructions 01/05/21 1612 01/05/21 1721   01/05/21 1515  ceFEPIme (MAXIPIME) 2 g in sodium chloride 0.9 % 100 mL IVPB        2 g 200 mL/hr over 30 Minutes Intravenous  Once 01/05/21 1513 01/05/21 1648       Objective: Vitals:   01/24/21 0358 01/24/21 0839  BP: (!) 144/95 (!) 138/97  Pulse: 82 78  Resp: 16 19  Temp: 97.9 F (36.6 C) 97.8 F (36.6 C)  SpO2: 99%  99%    Intake/Output Summary (Last 24 hours) at 01/24/2021 1209 Last data filed at 01/23/2021 2141 Gross per 24 hour  Intake 3 ml  Output --  Net 3 ml    Filed Weights   01/21/21 0546 01/23/21 0455 01/24/21 0446  Weight: 69.5 kg 65.7 kg 64.7 kg   Weight change: -1 kg Body mass index is 21.69 kg/m.   Physical Exam:   General: Appearance:    frail male in no acute distress     Lungs:     Clear to auscultation bilaterally, respirations unlabored  Heart:    Normal heart rate.    MS:   All extremities are intact. Legs crossed with right ankle on top of left   Neurologic:   Awake, alert, odd affect       Data Review: I have personally reviewed the laboratory data and studies available.     Signed, Joseph Art, DO [facility] 01/24/2021

## 2021-01-25 ENCOUNTER — Other Ambulatory Visit (HOSPITAL_COMMUNITY): Payer: Self-pay

## 2021-01-25 LAB — BASIC METABOLIC PANEL
Anion gap: 11 (ref 5–15)
BUN: 17 mg/dL (ref 6–20)
CO2: 21 mmol/L — ABNORMAL LOW (ref 22–32)
Calcium: 9.6 mg/dL (ref 8.9–10.3)
Chloride: 106 mmol/L (ref 98–111)
Creatinine, Ser: 0.89 mg/dL (ref 0.61–1.24)
GFR, Estimated: >60 mL/min (ref >60)
Glucose, Bld: 88 mg/dL (ref 70–99)
Potassium: 4 mmol/L (ref 3.5–5.1)
Sodium: 138 mmol/L (ref 135–145)

## 2021-01-25 NOTE — TOC Progression Note (Signed)
Transition of Care [facility]) - Progression Note    Patient Details  Name: Steve [name] MRN: [id number] Date of Birth: 01-25-1965  [facility] CM/SW Contact  Pollie Friar, RN Phone Number: 01/25/2021, 3:02 PM  Clinical Narrative:    Patient only ambulating 8 feet. Son unable to take him to his home. Pt unable to dc to [place] home.  CM met with the patient and he was interested in being faxed out for SNF rehab. Pt has no insurance and not sure he will qualify as this may not be a year long issue.  CM has sent out for SNF in the [facility] area.  TOC following.        Expected Discharge Plan and Services           Expected Discharge Date: 01/24/21                                     Social Determinants of Health (SDOH) Interventions    Readmission Risk Interventions No flowsheet data found.

## 2021-01-25 NOTE — Progress Notes (Signed)
PROGRESS NOTE  Steve [name]  DOB: [birth year]  PCP: No primary care provider on file. [id number]  DOA: 01/05/2021  LOS: 20 days  Hospital Day: 21  Chief Complaint  Patient presents with   Altered Mental Status   Seizures    Brief narrative: Steve [name] is a 56 y.o. male with history of major depression, anxiety who was in a sober living community for last 5 months. He was brought to the ED on 12/20 for convalescence, combativeness and altered sensorium.  Per collateral history, patient was not acting typical for last few days. 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  IV fluid resuscitation started Patient was admitted to ICU. 12/21, underwent LP which showed staph in CSF, felt to be contaminant.  EEG did not show any seizures 12/24, HD catheter was placed but patient did not require HD as creatinine started to improve 12/31, with clinical improvement, patient was transferred out of ICU to [facility]. Currently having issues with left LE "pain"- unable to return to prior living environment until able to ambulate, son can not/will not take home  01/25/2021: Patient was seen and examined at bedside.  He is alert and confused.  Denies having any pain.  Assessment/Plan: Left leg/foot pain -Patient has swelling, pain and tenderness and decreased range of motion of left ankle.  X-ray and MRI unremarkable.  Orthopedic consult obtained.  No local structural pathology identified.  No new recommendation. -MRI lumbar spine was obtained. It shows small foraminal disc protrusion at multiple levels from T11-L5.  I discussed the case with neurosurgeon Dr. Arnoldo Morale this morning.  He stated that the MRI is very close to normal and is not the primary cause of his left ankle issues. -PTA, patient was on Neurontin 400 mg 3 times daily.   It was held on admission because of significant AKI.  Renal function has improved to normal now. -neurontin resumed 1/6-- increased dose 1/8 -? Functional component to inability to walk as pain changes and is not consistent with exam  Seizure -Generalized convulsion was noted by his roommate prior to presentation. -EEG unremarkable.  Neurology consult appreciated.  Currently on Vimpat 100 mg twice daily given renal failure and possibility of Keppra contributing to agitation.  Acute renal failure secondary to rhabdomyolysis Acute metabolic acidosis -His creatinine continue to worsen to peak at 7.48 on 12/26.  Nephrology consult was obtained.  He was prepped for dialysis, had a HD catheter placed but never needed dialysis.  -Creatinine gradually improved to normal.  Serum bicarb level improved as well. -Nephrology consult appreciated  Rhabdomyolysis -Primary cause of AKI.  CK level improved with IV fluid. -resolved   Uremic encephalopathy -Was combative on admission. Mental status improved with improvement in uremia. -Patient states he has history of major depression and anxiety.   -Currently he is on Prozac 40 mg daily, Seroquel 100 mg twice daily, BuSpar 10 mg twice daily, trazodone 50 mg nightly.    ?? Upper extremity DVT -Per critical care note, hemodialysis cath had a clot on it when it was removed.  Ultrasound duplex scan of upper extremities was obtained on 12/31 which did not show any evidence of DVT.  Because of his risk of falls, noncompliance, recurrent seizures, I would avoid long-term anticoagulation without definite evidence of DVT  Aspiration pneumonia -Completed Zosyn -Isolated fever 12/27-has been stable, no associated leukocytosis -  No recurrence of the fever  Hypokalemia/hypomagnesemia -replete K -BMP in AM  Acute liver injury -His AST and ALT were significantly elevated to over 700s on admission -resolved  Evidence of right ventricular dysfunction and recent  echocardiogram -Echo 1/2 with EF 60 to 65%.  Cannot rule out a small PFO. -Not clinically significant as patient has no symptoms related to it. Continue to monitor as an outpatient.  Mobility: Encourage ambulation Living condition: from sober living community Goals of care:   Code Status: Full Code  Nutritional status: Body mass index is 21.08 kg/m.      Diet:  Diet Order             Diet general           Diet renal with fluid restriction Fluid restriction: Other (see comments); Room service appropriate? Yes; Fluid consistency: Thin  Diet effective now                  DVT prophylaxis:  heparin injection 5,000 Units Start: 01/16/21 1545    Consultants: Nephrology, PCCM, neurology, ID, ortho Family Communication: None at bedside  Status is: Inpatient  Level of care: Med-Surg   Dispo: The patient is from: sober living              Anticipated d/c is to: unclear              Patient currently is not medically stable to d/c. Due to no safe d/c        Infusions:   sodium chloride Stopped (01/08/21 2354)   lacosamide (VIMPAT) IV 100 mg (01/25/21 1014)    Scheduled Meds:  amLODipine  2.5 mg Oral Daily   aspirin  325 mg Oral Daily   Or   aspirin  300 mg Rectal Daily   busPIRone  10 mg Oral BID   docusate sodium  100 mg Oral BID   feeding supplement  237 mL Oral BID BM   FLUoxetine  40 mg Oral Daily   folic acid  1 mg Oral Daily   gabapentin  600 mg Oral TID   heparin injection (subcutaneous)  5,000 Units Subcutaneous Q8H   lidocaine  1 patch Transdermal Q24H   mouth rinse  15 mL Mouth Rinse BID   nicotine  21 mg Transdermal Daily   polyethylene glycol  17 g Oral Daily   QUEtiapine  100 mg Oral BID   sodium chloride flush  3 mL Intravenous Q12H   traZODone  50 mg Oral QHS    PRN meds: sodium chloride, albuterol, diphenhydrAMINE, docusate sodium, ondansetron (ZOFRAN) IV, polyethylene glycol, sodium chloride flush   Antimicrobials: Anti-infectives  (From admission, onward)    Start     Dose/Rate Route Frequency Ordered Stop   01/07/21 1500  vancomycin (VANCOREADY) IVPB 750 mg/150 mL  Status:  Discontinued        750 mg 150 mL/hr over 60 Minutes Intravenous Every 24 hours 01/06/21 1326 01/07/21 0802   01/07/21 0802  vancomycin variable dose per unstable renal function (pharmacist dosing)  Status:  Discontinued         Does not apply See admin instructions 01/07/21 0802 01/07/21 1000   01/06/21 2200  cefTRIAXone (ROCEPHIN) 2 g in sodium chloride 0.9 % 100 mL IVPB  Status:  Discontinued        2 g 200 mL/hr over 30 Minutes Intravenous Every 12 hours 01/06/21 1635 01/07/21 1000   01/06/21 1600  ceFEPIme (MAXIPIME) 2 g in sodium  chloride 0.9 % 100 mL IVPB  Status:  Discontinued        2 g 200 mL/hr over 30 Minutes Intravenous Every 24 hours 01/05/21 1612 01/05/21 1721   01/06/21 1415  vancomycin (VANCOREADY) IVPB 1500 mg/300 mL        1,500 mg 150 mL/hr over 120 Minutes Intravenous  Once 01/06/21 1326 01/06/21 1931   01/05/21 1845  Ampicillin-Sulbactam (UNASYN) 3 g in sodium chloride 0.9 % 100 mL IVPB  Status:  Discontinued        3 g 200 mL/hr over 30 Minutes Intravenous Every 12 hours 01/05/21 1754 01/06/21 1635   01/05/21 1645  vancomycin (VANCOCIN) IVPB 1000 mg/200 mL premix        1,000 mg 200 mL/hr over 60 Minutes Intravenous  Once 01/05/21 1513 01/05/21 1752   01/05/21 1612  vancomycin variable dose per unstable renal function (pharmacist dosing)  Status:  Discontinued         Does not apply See admin instructions 01/05/21 1612 01/05/21 1721   01/05/21 1515  ceFEPIme (MAXIPIME) 2 g in sodium chloride 0.9 % 100 mL IVPB        2 g 200 mL/hr over 30 Minutes Intravenous  Once 01/05/21 1513 01/05/21 1648       Objective: Vitals:   01/25/21 0752 01/25/21 1136  BP: (!) 154/92 129/83  Pulse: 74 83  Resp: 20 16  Temp: 98.4 F (36.9 C)   SpO2: 100% 98%    Intake/Output Summary (Last 24 hours) at 01/25/2021 1403 Last data filed  at 01/24/2021 2144 Gross per 24 hour  Intake 3 ml  Output --  Net 3 ml    Filed Weights   01/24/21 0446 01/25/21 0500 01/25/21 0507  Weight: 64.7 kg 62.6 kg 62.9 kg   Weight change: -2.1 kg Body mass index is 21.08 kg/m.   Physical Exam: No significant changes from prior exam.   General: Appearance:    frail male in no acute distress     Lungs:     Clear to auscultation bilaterally, respirations unlabored  Heart:    Normal heart rate.    MS:   All extremities are intact. Legs crossed with right ankle on top of left   Neurologic:   Awake, alert, odd affect       Data Review: I have personally reviewed the laboratory data and studies available.     Signed, Kayleen Memos, DO [facility] 01/25/2021

## 2021-01-25 NOTE — Progress Notes (Addendum)
Orthopaedics Daily Progress Note   01/25/2021   2:59 PM  Steve [name] is a 56 y.o. male with multiple active medical problems requiring admission to medical service who has also complained of left foot/ankle pain of unclear etiology.  Subjective Left foot/ankle pain better today.  Unable to reliably localize any singular anatomic location or structure.    Objective Vitals:   01/25/21 1136 01/25/21 1450  BP: 129/83 134/88  Pulse: 83 81  Resp: 16 18  Temp:  98.2 F (36.8 C)  SpO2: 98% 99%    Intake/Output Summary (Last 24 hours) at 01/25/2021 1459 Last data filed at 01/24/2021 2144 Gross per 24 hour  Intake 3 ml  Output --  Net 3 ml    Physical Exam LLE: Skin intact and benign No swelling, edema, or erythema on examination No focal tenderness on examination +DF/PF/EHL SILT SP/DP/T +DP/PT and WWP distally  Left ankle radiographs and MRI are both unremarkable without findings of any acute osseous abnormalities or other internal derangements.  Assessment 55 y.o. male with left ankle pain without clinically or radiographically identifiable underlying structural/anatomic etiology.  Plan Mobility: Out of bed with PT/OT as medically appropriate Pain control: Continue to wean/titrate to appropriate oral regimen DVT Prophylaxis: Per primary team Further surgical plans: None RUE: WBAT, ROMAT, no restrictions LUE: RUE: WBAT, ROMAT, no restrictions RLE: RUE: WBAT, ROMAT, no restrictions LLE: RUE: WBAT, ROMAT, no restrictions Disposition: Per primary team  There does not appear to be an orthopedic explanation for the patient's pain symptoms in his left ankle.  No clinical or radiographic findings are present to explain the patient's left ankle symptoms.  In the setting of normal x-rays, normal MRI, and no particular clinical findings, no orthopedic interventions recommended at this time.  Can consider symptomatic treatment with lidocaine or CAM walker boot if this improves patient's  symptoms.  Orthopedics will sign off.  No orthopaedic follow-up is necessary.   Georgeanna Harrison M.D. Orthopaedic Surgery [facility]

## 2021-01-25 NOTE — Progress Notes (Signed)
Occupational Therapy Treatment Patient Details Name: Steve SaverJames [name] MRN: [id number] DOB: [birth year] Today's Date: 01/25/2021   History of present illness Pt is a 56 y/o male who presented on 01/05/21 from his group home (drug/alcohol rehab?) s/p witnessed seizure and AMS. He was found to have AKI and rhabdomyolysis. Pt with c/o L ankle pain since 01/16/21 - imaging negative, ortho consulted.   No PMH noted in the chart.   OT comments  Steve [name] is progressing incrementally. Pt required significantly more cognitive assist this date compared to prior sessions. He required mod-max multimodal cues to initiate and sequence through all tasks, including bed mobility. With significantly increased time and assist pt was able to transfer from sit<>stand and take pivotal steps to the chair with the RW. Once standing at the chair pt required max A and the RW removed to sit, as he was standing with a flat affect not initiating despite max cues and assist to the arm rest. Pt continues to benefit from OT acutely. D/c recommendation updated to SNF as pt is unable to d/c with his son, or to his current group home.    Recommendations for follow up therapy are one component of a multi-disciplinary discharge planning process, led by the attending physician.  Recommendations may be updated based on patient status, additional functional criteria and insurance authorization.    Follow Up Recommendations  Skilled nursing-short term rehab (<3 hours/day)    Assistance Recommended at Discharge Set up Supervision/Assistance  Patient can return home with the following  A little help with walking and/or transfers;A little help with bathing/dressing/bathroom;Assistance with cooking/housework;Assist for transportation;Help with stairs or ramp for entrance   Equipment Recommendations  None recommended by OT    Recommendations for Other Services      Precautions / Restrictions Precautions Precautions: Fall Precaution Comments: L  foot pain Restrictions Weight Bearing Restrictions: No LLE Weight Bearing: Weight bearing as tolerated       Mobility Bed Mobility Overal bed mobility: Needs Assistance Bed Mobility: Supine to Sit     Supine to sit: Mod assist     General bed mobility comments: mod A for verbal cues and management of BLE. It took pt ~10 minutes to transfer from supine>sitting. He did not initiate any part of the task despite max cues.    Transfers Overall transfer level: Needs assistance Equipment used: Rolling walker (2 wheels) Transfers: Sit to/from Stand;Bed to chair/wheelchair/BSC Sit to Stand: Min assist   Step pivot transfers: Max assist       General transfer comment: min A to power into standing, once standing pt was close min guard. Max A for multimodial cues and physical assist to initiate and sequence pivotal steps to the chair.     Balance Overall balance assessment: Needs assistance Sitting-balance support: Feet supported Sitting balance-Leahy Scale: Good     Standing balance support: Bilateral upper extremity supported;During functional activity Standing balance-Leahy Scale: Poor                             ADL either performed or assessed with clinical judgement   ADL       Grooming: Moderate assistance;Sitting Grooming Details (indicate cue type and reason): mod A to initiate task, mod verbal cues to sustain and sequence. +significantly incrsed time             Lower Body Dressing: Moderate assistance;Sit to/from stand Lower Body Dressing Details (indicate cue type and reason): donned R sock  wtih verbal ues. Required assist with L sock Toilet Transfer: Maximal assistance;Stand-pivot;Rolling walker (2 wheels) Toilet Transfer Details (indicate cue type and reason): max A for initiation of all tasks, once standing he required max A to sit and elimination of RW         Functional mobility during ADLs: Maximal assistance;Rolling walker (2  wheels) General ADL Comments: pt with severely impair cognition this session and required mod-max verbal and tactile cues for all functional tasks. Pt perseverating on movement throughout and required assit to initiate next step in sequence. significantly increased time required    Extremity/Trunk Assessment Upper Extremity Assessment Upper Extremity Assessment: Generalized weakness   Lower Extremity Assessment Lower Extremity Assessment: Defer to PT evaluation        Vision       Perception     Praxis      Cognition Arousal/Alertness: Awake/alert Behavior During Therapy: Anxious;Impulsive;Flat affect Overall Cognitive Status: No family/caregiver present to determine baseline cognitive functioning Area of Impairment: Orientation;Attention;Memory;Following commands;Safety/judgement;Awareness;Problem solving                 Orientation Level: Disoriented to;Place;Situation Current Attention Level: Sustained Memory: Decreased short-term memory Following Commands: Follows one step commands inconsistently Safety/Judgement: Decreased awareness of deficits;Decreased awareness of safety Awareness: Intellectual Problem Solving: Slow processing;Requires verbal cues;Requires tactile cues General Comments: pt not following commands this session with flat affect/blank stare. Required tactile cues to initiate all movement including bed mobility, stepping and rw management. Pt cued to "step backward" as he continued to push the RW forward. Pt oriented to self and time only. Pt perseverating on certain movements and difficult to re-direct: putting blanket over legs crossing LLE over RLE, pushing walker forward          Exercises     Shoulder Instructions       General Comments VSS on RA, pt with significant change in cognition this session compared to previous session    Pertinent Vitals/ Pain       Pain Assessment: Faces Faces Pain Scale: Hurts a little bit Pain Location: L  foot/ankle (lower calf, down behind ankle, heel, under foot to toes) Pain Descriptors / Indicators: Aching;Guarding;Grimacing Pain Intervention(s): Limited activity within patient's tolerance;Monitored during session  Home Living                                          Prior Functioning/Environment              Frequency  Min 2X/week        Progress Toward Goals  OT Goals(current goals can now be found in the care plan section)  Progress towards OT goals: Progressing toward goals  Acute Rehab OT Goals OT Goal Formulation: With patient Time For Goal Achievement: 01/30/21 Potential to Achieve Goals: Good ADL Goals Pt Will Perform Grooming: with modified independence;standing Pt Will Perform Lower Body Bathing: with modified independence;with adaptive equipment;sitting/lateral leans;sit to/from stand Pt Will Perform Lower Body Dressing: with modified independence;with adaptive equipment;sitting/lateral leans;sit to/from stand Pt Will Transfer to Toilet: with modified independence;ambulating;grab bars Pt Will Perform Toileting - Clothing Manipulation and hygiene: with modified independence;sitting/lateral leans;sit to/from stand Additional ADL Goal #1: Pt will demonstrate decreased pain to LEs by tolerating light touch as needed for LE ADLs in order to increase independence with and tolerance for self care.  Plan      Co-evaluation  AM-PAC OT "6 Clicks" Daily Activity     Outcome Measure   Help from another person eating meals?: None Help from another person taking care of personal grooming?: A Lot Help from another person toileting, which includes using toliet, bedpan, or urinal?: A Lot Help from another person bathing (including washing, rinsing, drying)?: A Lot Help from another person to put on and taking off regular upper body clothing?: A Little Help from another person to put on and taking off regular lower body clothing?: A  Lot 6 Click Score: 15    End of Session Equipment Utilized During Treatment: Gait belt;Rolling walker (2 wheels)  OT Visit Diagnosis: Unsteadiness on feet (R26.81);Pain;Muscle weakness (generalized) (M62.81);Dizziness and giddiness (R42) Pain - Right/Left: Left Pain - part of body: Leg;Ankle and joints of foot   Activity Tolerance Patient limited by pain   Patient Left in bed;with call bell/phone within reach;with chair alarm set   Nurse Communication Mobility status        Time: 2947-6546 OT Time Calculation (min): 35 min  Charges: OT General Charges $OT Visit: 1 Visit OT Treatments $Therapeutic Activity: 23-37 mins   Steve [name] A Steve [name] 01/25/2021, 4:59 PM

## 2021-01-25 NOTE — NC FL2 (Signed)
[place] LEVEL OF CARE SCREENING TOOL     IDENTIFICATION  Patient Name: Steve [name] Birthdate: 1965/12/23 Sex: male Admission Date (Current Location): 01/05/2021  [facility] and [place] Number:  Herbalist and Address:  [facility], [place] [address] [place], [place] [address]      Provider Number: [id number]  Attending Physician Name and Address:  Kayleen Memos, DO  Relative Name and Phone Number:       Current Level of Care: Hospital Recommended Level of Care: [place] Prior Approval Number:    Date Approved/Denied:   PASRR Number: pending  Discharge Plan: SNF    Current Diagnoses: Patient Active Problem List   Diagnosis Date Noted   Pressure injury of skin 01/13/2021   Fever    Severe sepsis without septic shock ([place])    Acute metabolic encephalopathy [id number]    Orientation RESPIRATION BLADDER Height & Weight     Self, Situation  Normal Continent Weight: 62.9 kg Height:  5\' 8"  (172.7 cm)  BEHAVIORAL SYMPTOMS/MOOD NEUROLOGICAL BOWEL NUTRITION STATUS    Convulsions/Seizures (vimpat 100 mg BID) Continent Diet (renal with thin liquids)  AMBULATORY STATUS COMMUNICATION OF NEEDS Skin   Limited Assist Verbally PU Stage and Appropriate Care PU Stage 1 Dressing:  (Prn--to foot)                     Personal Care Assistance Level of Assistance  Bathing, Feeding, Dressing Bathing Assistance: Limited assistance Feeding assistance: Independent Dressing Assistance: Limited assistance     Functional Limitations Info  Sight, Hearing, Speech Sight Info: Adequate Hearing Info: Adequate Speech Info: Adequate    SPECIAL CARE FACTORS FREQUENCY  PT (By licensed PT), OT (By licensed OT)     PT Frequency: 5x/wk OT Frequency: 5x/wk            Contractures Contractures Info: Not present    Additional Factors Info  Code Status, Allergies, Psychotropic Code Status Info: Full Allergies Info:  NKDA Psychotropic Info: Buspar 10 mg BID/ Prozac 40 mg daily/ neurontin 600 mg TID/ Seroquel 100 mg BID/ trazadone 50 mg at bedtime         Current Medications (01/25/2021):  This is the current hospital active medication list Current Facility-Administered Medications  Medication Dose Route Frequency Provider Last Rate Last Admin   0.9 %  sodium chloride infusion   Intravenous PRN Margaretha Seeds, MD   Stopped at 01/08/21 2354   albuterol (PROVENTIL) (2.5 MG/3ML) 0.083% nebulizer solution 2.5 mg  2.5 mg Nebulization Q3H PRN Anders Simmonds, MD   2.5 mg at 01/13/21 0233   amLODipine (NORVASC) tablet 2.5 mg  2.5 mg Oral Daily Dahal, Binaya, MD   2.5 mg at 01/25/21 1134   aspirin tablet 325 mg  325 mg Oral Daily Greta Doom, MD   325 mg at 01/25/21 1015   Or   aspirin suppository 300 mg  300 mg Rectal Daily Greta Doom, MD       busPIRone (BUSPAR) tablet 10 mg  10 mg Oral BID Olalere, Adewale A, MD   10 mg at 01/25/21 1015   diphenhydrAMINE (BENADRYL) injection 12.5 mg  12.5 mg Intravenous Q6H PRN Erick Colace, NP   12.5 mg at 01/14/21 2050   docusate sodium (COLACE) capsule 100 mg  100 mg Oral BID PRN Erick Colace, NP   100 mg at 01/13/21 1945   docusate sodium (COLACE) capsule 100 mg  100 mg Oral BID Spero Geralds, MD   100 mg at 01/24/21 2143   feeding supplement (ENSURE ENLIVE / ENSURE PLUS) liquid 237 mL  237 mL Oral BID BM Dahal, Marlowe Aschoff, MD   237 mL at 01/25/21 1418   FLUoxetine (PROZAC) capsule 40 mg  40 mg Oral Daily Dahal, Binaya, MD   40 mg at [id number]   folic acid (FOLVITE) tablet 1 mg  1 mg Oral Daily Vann, Jessica U, DO   1 mg at 01/25/21 1015   gabapentin (NEURONTIN) capsule 600 mg  600 mg Oral TID Eulogio Bear U, DO   600 mg at 01/25/21 1015   heparin injection 5,000 Units  5,000 Units Subcutaneous Q8H Dahal, Binaya, MD   5,000 Units at 01/25/21 1418   lacosamide (VIMPAT) 100 mg in sodium chloride 0.9 % 25 mL IVPB  100 mg Intravenous Q12H  Greta Doom, MD 70 mL/hr at 01/25/21 1014 100 mg at 01/25/21 1014   lidocaine (LIDODERM) 5 % 1 patch  1 patch Transdermal Q24H Vann, Jessica U, DO   1 patch at 01/25/21 1134   MEDLINE mouth rinse  15 mL Mouth Rinse BID Olalere, Adewale A, MD   15 mL at 01/25/21 1020   nicotine (NICODERM CQ - dosed in mg/24 hours) patch 21 mg  21 mg Transdermal Daily Dahal, Binaya, MD   21 mg at 01/25/21 1018   ondansetron (ZOFRAN) injection 4 mg  4 mg Intravenous Q8H PRN Margaretmary Lombard, MD   4 mg at 01/15/21 0208   polyethylene glycol (MIRALAX / GLYCOLAX) packet 17 g  17 g Oral Daily PRN Erick Colace, NP       polyethylene glycol (MIRALAX / GLYCOLAX) packet 17 g  17 g Oral Daily Spero Geralds, MD   17 g at 01/24/21 0932   QUEtiapine (SEROQUEL) tablet 100 mg  100 mg Oral BID Olalere, Adewale A, MD   100 mg at 01/25/21 1015   sodium chloride flush (NS) 0.9 % injection 3 mL  3 mL Intravenous PRN Spero Geralds, MD       sodium chloride flush (NS) 0.9 % injection 3 mL  3 mL Intravenous Q12H Spero Geralds, MD   3 mL at 01/25/21 1018   traZODone (DESYREL) tablet 50 mg  50 mg Oral QHS Terrilee Croak, MD   50 mg at 01/24/21 2143     Discharge Medications: Please see discharge summary for a list of discharge medications.  Relevant Imaging Results:  Relevant Lab Results:   Additional Information SS#: [id number]  Pollie Friar, RN

## 2021-01-26 MED ORDER — SODIUM CHLORIDE 0.9 % IV SOLN
100.0000 mg | Freq: Once | INTRAVENOUS | Status: AC
  Administered 2021-01-26: 100 mg via INTRAVENOUS
  Filled 2021-01-26: qty 10

## 2021-01-26 MED ORDER — LACOSAMIDE 50 MG PO TABS
100.0000 mg | ORAL_TABLET | Freq: Two times a day (BID) | ORAL | Status: DC
  Administered 2021-01-26 – 2021-02-05 (×20): 100 mg via ORAL
  Filled 2021-01-26 (×20): qty 2

## 2021-01-26 NOTE — Progress Notes (Signed)
Physical Therapy Treatment Patient Details Name: Steve [name] MRN: [id number] DOB: Dec 06, 1965 Today's Date: 01/26/2021   History of Present Illness Pt is a 56 y/o male who presented on 01/05/21 from his group home (drug/alcohol rehab?) s/p witnessed seizure and AMS. He was found to have AKI and rhabdomyolysis. Pt with c/o L ankle pain since 01/16/21 - imaging negative, ortho consulted.   No PMH noted in the chart.    PT Comments    Pt with slow progression towards goals. Pt continues to exhibit difficulty with sequencing and does not follow commands. Pt requiring max multimodal cues. Mod A for bed mobility and min to mod A for transfers. Had to be assisted back into sitting as pt not following commands to sit. Updated recommendations to SNF given new cognitive and physical deficits. Will continue to follow acutely.     Recommendations for follow up therapy are one component of a multi-disciplinary discharge planning process, led by the attending physician.  Recommendations may be updated based on patient status, additional functional criteria and insurance authorization.  Follow Up Recommendations  Skilled nursing-short term rehab (<3 hours/day)     Assistance Recommended at Discharge Frequent or constant Supervision/Assistance  Patient can return home with the following A lot of help with walking and/or transfers;A lot of help with bathing/dressing/bathroom   Equipment Recommendations  Rolling walker (2 wheels);Wheelchair (measurements PT)    Recommendations for Other Services       Precautions / Restrictions Precautions Precautions: Fall Restrictions Weight Bearing Restrictions: No     Mobility  Bed Mobility Overal bed mobility: Needs Assistance Bed Mobility: Supine to Sit;Sit to Supine     Supine to sit: Mod assist Sit to supine: Mod assist   General bed mobility comments: Multimodal cues for sequencing. Requiring tactile assist for initiation of tasks     Transfers Overall transfer level: Needs assistance Equipment used: Rolling walker (2 wheels) Transfers: Sit to/from Stand Sit to Stand: Min assist;Mod assist           General transfer comment: Attempted to stand X4 and was able to stand fully upright on last stand. Once in standing, not following commands to take steps and stood on RLE only. Pt became very shaky and required physical assist to return to seated position.    Ambulation/Gait                   Stairs             Wheelchair Mobility    Modified Rankin (Stroke Patients Only)       Balance Overall balance assessment: Needs assistance Sitting-balance support: Feet supported Sitting balance-Leahy Scale: Fair     Standing balance support: Bilateral upper extremity supported Standing balance-Leahy Scale: Poor Standing balance comment: Reliant on BUE and external support                            Cognition Arousal/Alertness: Awake/alert Behavior During Therapy: Flat affect Overall Cognitive Status: No family/caregiver present to determine baseline cognitive functioning                                 General Comments: Very flat affect. Not following commands. Max multimodal cues for sequencing.        Exercises      General Comments        Pertinent Vitals/Pain Pain Assessment: Faces Faces Pain  Scale: Hurts little more Pain Location: L foot ankle Pain Descriptors / Indicators: Aching;Guarding;Grimacing Pain Intervention(s): Limited activity within patient's tolerance;Monitored during session;Repositioned    Home Living                          Prior Function            PT Goals (current goals can now be found in the care plan section) Acute Rehab PT Goals Patient Stated Goal: non stated PT Goal Formulation: With patient Time For Goal Achievement: 01/30/21 Potential to Achieve Goals: Fair Progress towards PT goals: Progressing toward  goals    Frequency    Min 2X/week      PT Plan Discharge plan needs to be updated    Co-evaluation              AM-PAC PT "6 Clicks" Mobility   Outcome Measure  Help needed turning from your back to your side while in a flat bed without using bedrails?: A Lot Help needed moving from lying on your back to sitting on the side of a flat bed without using bedrails?: A Lot Help needed moving to and from a bed to a chair (including a wheelchair)?: Total Help needed standing up from a chair using your arms (e.g., wheelchair or bedside chair)?: A Lot Help needed to walk in hospital room?: Total Help needed climbing 3-5 steps with a railing? : Total 6 Click Score: 9    End of Session Equipment Utilized During Treatment: Gait belt Activity Tolerance: Patient limited by pain Patient left: in bed;with call bell/phone within reach;with bed alarm set Nurse Communication: Mobility status PT Visit Diagnosis: Unsteadiness on feet (R26.81);Pain Pain - Right/Left: Left Pain - part of body: Ankle and joints of foot     Time: 0102-7253 PT Time Calculation (min) (ACUTE ONLY): 16 min  Charges:  $Therapeutic Activity: 8-22 mins                     Cindee Salt, DPT  Acute Rehabilitation Services  Pager: [phone] Office: [phone]    Lehman Prom 01/26/2021, 4:33 PM
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
## 2021-01-26 NOTE — Progress Notes (Signed)
PROGRESS NOTE  Steve [name]  DOB: 18-Oct-1965  PCP: No primary care provider on file. [id number]  DOA: 01/05/2021  LOS: 21 days  Hospital Day: 54  Chief Complaint  Patient presents with   Altered Mental Status   Seizures    Brief narrative: Steve [name] is a 56 y.o. male with history of major depression, anxiety who was in a sober living community for last 5 months. He was brought to the ED on 12/20 for convalescence, combativeness and altered sensorium.  Per collateral history, patient was not acting typical for last few days. 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  IV fluid resuscitation started Patient was admitted to ICU. 12/21, underwent LP which showed staph in CSF, felt to be contaminant.  EEG did not show any seizures 12/24, HD catheter was placed but patient did not require HD as creatinine started to improve 12/31, with clinical improvement, patient was transferred out of ICU to [facility]. Currently having issues with left LE "pain"- unable to return to prior living environment until able to ambulate, son can not/will not take home  01/26/2021: Patient was seen and examined at his bedside.  He has no new complaints.  There were no acute events overnight.  Pending placement to SNF.  Assessment/Plan: Left leg/foot pain -Patient has swelling, pain and tenderness and decreased range of motion of left ankle.  X-ray and MRI unremarkable.  Orthopedic consult obtained.  No local structural pathology identified.  No new recommendation. -MRI lumbar spine was obtained. It shows small foraminal disc protrusion at multiple levels from T11-L5.  I discussed the case with neurosurgeon Dr. Arnoldo Morale this morning.  He stated that the MRI is very close to normal and is not the primary cause of his left ankle issues. -PTA,  patient was on Neurontin 400 mg 3 times daily.  It was held on admission because of significant AKI.  Renal function has improved to normal now. -neurontin resumed 1/6-- increased dose 1/8 -? Functional component to inability to walk as pain changes and is not consistent with exam  Seizure -Generalized convulsion was noted by his roommate prior to presentation. -EEG unremarkable.  Neurology consult appreciated.  Currently on Vimpat 100 mg twice daily given renal failure and possibility of Keppra contributing to agitation.  Acute renal failure secondary to rhabdomyolysis Acute metabolic acidosis -His creatinine continue to worsen to peak at 7.48 on 12/26.  Nephrology consult was obtained.  He was prepped for dialysis, had a HD catheter placed but never needed dialysis.  -Creatinine gradually improved to normal.  Serum bicarb level improved as well. -Nephrology consult appreciated  Rhabdomyolysis -Primary cause of AKI.  CK level improved with IV fluid. -resolved   Uremic encephalopathy -Was combative on admission. Mental status improved with improvement in uremia. -Patient states he has history of major depression and anxiety.   -Currently he is on Prozac 40 mg daily, Seroquel 100 mg twice daily, BuSpar 10 mg twice daily, trazodone 50 mg nightly.    ?? Upper extremity DVT -Per critical care note, hemodialysis cath had a clot on it when it was removed.  Ultrasound duplex scan of upper extremities was obtained on 12/31 which did not show any evidence of DVT.  Because of his risk of falls, noncompliance, recurrent seizures, I would avoid long-term anticoagulation without definite evidence of DVT  Aspiration pneumonia -Completed Zosyn -  Isolated fever 12/27-has been stable, no associated leukocytosis -No recurrence of the fever  Hypokalemia/hypomagnesemia -replete K -BMP in AM  Acute liver injury -His AST and ALT were significantly elevated to over 700s on  admission -resolved  Evidence of right ventricular dysfunction and recent echocardiogram -Echo 1/2 with EF 60 to 65%.  Cannot rule out a small PFO. -Not clinically significant as patient has no symptoms related to it. Continue to monitor as an outpatient.  Mobility: Encourage ambulation Living condition: from sober living community Goals of care:   Code Status: Full Code  Nutritional status: Body mass index is 20.31 kg/m.      Diet:  Diet Order             Diet general           Diet renal with fluid restriction Fluid restriction: Other (see comments); Room service appropriate? Yes; Fluid consistency: Thin  Diet effective now                  DVT prophylaxis:  heparin injection 5,000 Units Start: 01/16/21 1545    Consultants: Nephrology, PCCM, neurology, ID, ortho Family Communication: None at bedside  Status is: Inpatient  Level of care: Med-Surg   Dispo: The patient is from: sober living              Anticipated d/c is to: unclear              Patient currently is not medically stable to d/c. Due to no safe d/c        Infusions:   sodium chloride Stopped (01/08/21 2354)    Scheduled Meds:  amLODipine  2.5 mg Oral Daily   aspirin  325 mg Oral Daily   Or   aspirin  300 mg Rectal Daily   busPIRone  10 mg Oral BID   docusate sodium  100 mg Oral BID   feeding supplement  237 mL Oral BID BM   FLUoxetine  40 mg Oral Daily   folic acid  1 mg Oral Daily   gabapentin  600 mg Oral TID   heparin injection (subcutaneous)  5,000 Units Subcutaneous Q8H   lacosamide  100 mg Oral BID   lidocaine  1 patch Transdermal Q24H   mouth rinse  15 mL Mouth Rinse BID   nicotine  21 mg Transdermal Daily   polyethylene glycol  17 g Oral Daily   QUEtiapine  100 mg Oral BID   sodium chloride flush  3 mL Intravenous Q12H   traZODone  50 mg Oral QHS    PRN meds: sodium chloride, albuterol, diphenhydrAMINE, docusate sodium, ondansetron (ZOFRAN) IV, polyethylene glycol,  sodium chloride flush   Antimicrobials: Anti-infectives (From admission, onward)    Start     Dose/Rate Route Frequency Ordered Stop   01/07/21 1500  vancomycin (VANCOREADY) IVPB 750 mg/150 mL  Status:  Discontinued        750 mg 150 mL/hr over 60 Minutes Intravenous Every 24 hours 01/06/21 1326 01/07/21 0802   01/07/21 0802  vancomycin variable dose per unstable renal function (pharmacist dosing)  Status:  Discontinued         Does not apply See admin instructions 01/07/21 0802 01/07/21 1000   01/06/21 2200  cefTRIAXone (ROCEPHIN) 2 g in sodium chloride 0.9 % 100 mL IVPB  Status:  Discontinued        2 g 200 mL/hr over 30 Minutes Intravenous Every 12 hours 01/06/21 1635 01/07/21 1000   01/06/21 1600  ceFEPIme (MAXIPIME) 2 g in sodium chloride 0.9 % 100 mL IVPB  Status:  Discontinued        2 g 200 mL/hr over 30 Minutes Intravenous Every 24 hours 01/05/21 1612 01/05/21 1721   01/06/21 1415  vancomycin (VANCOREADY) IVPB 1500 mg/300 mL        1,500 mg 150 mL/hr over 120 Minutes Intravenous  Once 01/06/21 1326 01/06/21 1931   01/05/21 1845  Ampicillin-Sulbactam (UNASYN) 3 g in sodium chloride 0.9 % 100 mL IVPB  Status:  Discontinued        3 g 200 mL/hr over 30 Minutes Intravenous Every 12 hours 01/05/21 1754 01/06/21 1635   01/05/21 1645  vancomycin (VANCOCIN) IVPB 1000 mg/200 mL premix        1,000 mg 200 mL/hr over 60 Minutes Intravenous  Once 01/05/21 1513 01/05/21 1752   01/05/21 1612  vancomycin variable dose per unstable renal function (pharmacist dosing)  Status:  Discontinued         Does not apply See admin instructions 01/05/21 1612 01/05/21 1721   01/05/21 1515  ceFEPIme (MAXIPIME) 2 g in sodium chloride 0.9 % 100 mL IVPB        2 g 200 mL/hr over 30 Minutes Intravenous  Once 01/05/21 1513 01/05/21 1648       Objective: Vitals:   01/26/21 0716 01/26/21 1143  BP:  (!) 128/96  Pulse:  84  Resp:  20  Temp: 98 F (36.7 C) 98.1 F (36.7 C)  SpO2:  99%     Intake/Output Summary (Last 24 hours) at 01/26/2021 1423 Last data filed at 01/26/2021 0900 Gross per 24 hour  Intake 240 ml  Output --  Net 240 ml    Filed Weights   01/25/21 0500 01/25/21 0507 01/26/21 0500  Weight: 62.6 kg 62.9 kg 60.6 kg   Weight change: -2 kg Body mass index is 20.31 kg/m.   Physical Exam: No significant changes from prior exam.   General: Appearance:    frail male in no acute distress     Lungs:     Clear to auscultation bilaterally, respirations unlabored  Heart:    Normal heart rate.    MS:   All extremities are intact. Legs crossed with right ankle on top of left   Neurologic:   Awake, alert, odd affect       Data Review: I have personally reviewed the laboratory data and studies available.     Signed, Kayleen Memos, DO [facility] 01/26/2021

## 2021-01-26 NOTE — Evaluation (Signed)
Speech Language Pathology Evaluation Patient Details Name: Steve [name] MRN: [id number] DOB: 1965/09/26 Today's Date: 01/26/2021 Time: 0915-0930 SLP Time Calculation (min) (ACUTE ONLY): 15 min  Problem List:  Patient Active Problem List   Diagnosis Date Noted   Pressure injury of skin 01/13/2021   Fever    Severe sepsis without septic shock ([name])    Acute metabolic encephalopathy 01/05/2021   Past Medical History: History reviewed. No pertinent past medical history. Past Surgical History: History reviewed. No pertinent surgical history. HPI:  56yo male admitted 01/05/21 after being found down at his group home, full body convulsions and combativeness. PMH: no formal pmh per H&P   Assessment / Plan / Recommendation Clinical Impression  Portions of the Mini-Mental State Examination (MMSE) were administered. Pt was very distractible during testing, requiring frequent redirection. He laughed throughout this evaluation, frequently talking about chickens and cats. Pt was unable to answer all questions regarding orientation to time, place, or situation. He stated 2023 initially, then reported the year as 2022. He demonstrated immediate recall of 3/3 unrelated words, but was unable to recall them after a delay, even given a choice. Pt was able to spell WORLD, but was unable to spell world backwards. Language screen appears intact, with successful naming, repetition, and direction following. Pt scored 9/27 (reading/writing subtests not administered at this time. Pt's baseline level of function or education is unknown). Given encephalopathy, close supervision is recommended at DC. Skilled ST intervention may be beneficial at next venue of care to facilitate establishment of routines to maximize safety and independence.    SLP Assessment  SLP Recommendation/Assessment: All further Speech Language Pathology needs can be addressed in the next venue of care  SLP Visit Diagnosis: Attention and  concentration deficit Attention and concentration deficit following:  (encephalopathy)    Recommendations for follow up therapy are one component of a multi-disciplinary discharge planning process, led by the attending physician.  Recommendations may be updated based on patient status, additional functional criteria and insurance authorization.    Follow Up Recommendations  Skilled nursing-short term rehab (<3 hours/day)    Assistance Recommended at Discharge  Frequent or constant Supervision/Assistance  Functional Status Assessment Patient has not had a recent decline in their functional status     SLP Evaluation Cognition  Overall Cognitive Status: No family/caregiver present to determine baseline cognitive functioning Arousal/Alertness: Awake/alert (restless, distractible.) Orientation Level: Oriented to person;Disoriented to time;Disoriented to place;Disoriented to situation       Comprehension  Auditory Comprehension Overall Auditory Comprehension: Appears within functional limits for tasks assessed Visual Recognition/Discrimination Discrimination: Not tested Reading Comprehension Reading Status: Not tested    Expression Expression Primary Mode of Expression: Verbal Verbal Expression Overall Verbal Expression: Appears within functional limits for tasks assessed Written Expression Written Expression: Not tested   Oral / Motor  Oral Motor/Sensory Function Overall Oral Motor/Sensory Function: Within functional limits Motor Speech Overall Motor Speech: Appears within functional limits for tasks assessed           Steve [name] B. Murvin Natal, [facility], CCC-SLP Speech Language Pathologist Office: [phone]  Leigh Aurora 01/26/2021, 9:52 AM

## 2021-01-27 NOTE — Plan of Care (Signed)
  Problem: Education: Goal: Knowledge of General Education information will improve Description Including pain rating scale, medication(s)/side effects and non-pharmacologic comfort measures Outcome: Progressing   Problem: Health Behavior/Discharge Planning: Goal: Ability to manage health-related needs will improve Outcome: Progressing   

## 2021-01-27 NOTE — Plan of Care (Signed)

## 2021-01-27 NOTE — Progress Notes (Signed)
PROGRESS NOTE  Steve [name]  DOB: 1965-02-05  PCP: No primary care provider on file. HYH:[id number]  DOA: 01/05/2021  LOS: 22 days  Hospital Day: 23  Chief Complaint  Patient presents with   Altered Mental Status   Seizures    Brief narrative: Steve [name] is a 56 y.o. male with history of major depression, anxiety who was in a group home for last 5 months. He was brought to the ED on 12/20 from a group home for convalescence, combativeness and altered sensorium.  Per collateral history, patient was not acting typical for last few days. In the ED, patient was confused, combative Labs showed AKI with creatinine of 3.27, rhabdomyolysis, abnormal LFTs, elevated WBC count at 23.7 and lactic acid level at 7.6. 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  IV fluid resuscitation started Patient was admitted to ICU. 12/21, underwent LP which showed staph in CSF, felt to be contaminant.  EEG did not show any seizures 12/24, HD catheter was placed but patient did not require HD as creatinine started to improve 12/31, with clinical improvement, patient was transferred out of ICU to [facility].  His main problem over the last several days has been severe pain in the left lower extremity affecting his ambulation because of which he has not been able to be discharged.  Subjective: Patient was seen and examined this morning.  Sitting up in bed.  Not in distress.  He states that he is left leg pain is improving.  He states that he has been able to walk to the bathroom with a walker. He seems to be having inconsistent findings on evaluation by PT.  Assessment/Plan: Left leg/foot pain Left ankle swelling -Patient had swelling, pain and tenderness and decreased range of motion of left ankle.  X-ray and MRI unremarkable.  Orthopedic  consult obtained.  No local structural pathology identified.  No new recommendation. -MRI lumbar spine was obtained. It shows small foraminal disc protrusion at multiple levels from T11-L5.  I discussed the case with neurosurgeon Steve [name] this morning.  He stated that the MRI is very close to normal and is not the primary cause of his left ankle issues. -PTA, patient was on Neurontin 400 mg 3 times daily.  It was held on admission because of significant AKI.  After improvement in renal function, Neurontin was resumed.  Dose increased.  Pain seems to be improving.  But he still is not able to be discharged to group home where he is expected to be able to work. -Patient's son stated that he is not able to accept patient at home.  Seizure -Generalized convulsion was noted by his roommate prior to presentation. -EEG unremarkable.  Neurology consult appreciated.  Currently on Vimpat 100 mg twice daily given renal failure and possibility of Keppra contributing to agitation.  Acute renal failure secondary to rhabdomyolysis Acute metabolic acidosis -His creatinine continue to worsen to peak at 7.48 on 12/26.  Nephrology consult was obtained.  He was prepped for dialysis, had a HD catheter placed but never needed dialysis.  -Creatinine gradually improved to normal.  Serum bicarb level improved as well. -Nephrology consult appreciated  Rhabdomyolysis -Primary cause of AKI.  CK level improved with IV fluid.   Uremic encephalopathy -Was combative on admission. Mental status improved with improvement in uremia. -Patient states he has history of major depression  and anxiety.   -Currently he is on Prozac 40 mg daily, Seroquel 100 mg twice daily, BuSpar 10 mg twice daily, trazodone 50 mg nightly.    ?? Upper extremity DVT -Per critical care note, hemodialysis cath had a clot on it when it was removed.  Ultrasound duplex scan of upper extremities was obtained on 12/31 which did not show any evidence of  DVT.  Because of his risk of falls, noncompliance, recurrent seizures, I would avoid long-term anticoagulation without definite evidence of DVT.    Aspiration pneumonia -Completed Zosyn  Hypokalemia/hypomagnesemia -Recheck labs tomorrow.  Acute liver injury -His AST and ALT were significantly elevated to over 700s on admission.  Gradually improved to normal.  Evidence of right ventricular dysfunction and recent echocardiogram -Echo 1/2 with EF 60 to 65%.  Cannot rule out a small PFO. -Not clinically significant as patient has no symptoms related to it. Continue to monitor as an outpatient.  Mobility: Encourage ambulation Living condition: Group home Goals of care:   Code Status: Full Code  Nutritional status: Body mass index is 20.11 kg/m.      Diet:  Diet Order             Diet regular Room service appropriate? Yes; Fluid consistency: Thin  Diet effective now           Diet general                  DVT prophylaxis:  heparin injection 5,000 Units Start: 01/16/21 1545   Antimicrobials: None Fluid: None Consultants: Nephrology Family Communication: None at bedside  Status is: Inpatient  Continue in-hospital care because: Unable to discharge because patient continues to have significant pain in his left ankle and unable to ambulate. Level of care: Med-Surg   Dispo: The patient is from: Group home              Anticipated d/c is to: Group home versus home.  No medical insurance for SNF discharge              Patient currently is not medically stable to d/c.   Difficult to place patient No     Infusions:   sodium chloride Stopped (01/08/21 2354)    Scheduled Meds:  amLODipine  2.5 mg Oral Daily   aspirin  325 mg Oral Daily   Or   aspirin  300 mg Rectal Daily   busPIRone  10 mg Oral BID   docusate sodium  100 mg Oral BID   feeding supplement  237 mL Oral BID BM   FLUoxetine  40 mg Oral Daily   folic acid  1 mg Oral Daily   gabapentin  600 mg Oral TID    heparin injection (subcutaneous)  5,000 Units Subcutaneous Q8H   lacosamide  100 mg Oral BID   lidocaine  1 patch Transdermal Q24H   mouth rinse  15 mL Mouth Rinse BID   nicotine  21 mg Transdermal Daily   polyethylene glycol  17 g Oral Daily   QUEtiapine  100 mg Oral BID   sodium chloride flush  3 mL Intravenous Q12H   traZODone  50 mg Oral QHS    PRN meds: sodium chloride, albuterol, docusate sodium, ondansetron (ZOFRAN) IV, polyethylene glycol, sodium chloride flush   Antimicrobials: Anti-infectives (From admission, onward)    Start     Dose/Rate Route Frequency Ordered Stop   01/07/21 1500  vancomycin (VANCOREADY) IVPB 750 mg/150 mL  Status:  Discontinued  750 mg 150 mL/hr over 60 Minutes Intravenous Every 24 hours 01/06/21 1326 01/07/21 0802   01/07/21 0802  vancomycin variable dose per unstable renal function (pharmacist dosing)  Status:  Discontinued         Does not apply See admin instructions 01/07/21 0802 01/07/21 1000   01/06/21 2200  cefTRIAXone (ROCEPHIN) 2 g in sodium chloride 0.9 % 100 mL IVPB  Status:  Discontinued        2 g 200 mL/hr over 30 Minutes Intravenous Every 12 hours 01/06/21 1635 01/07/21 1000   01/06/21 1600  ceFEPIme (MAXIPIME) 2 g in sodium chloride 0.9 % 100 mL IVPB  Status:  Discontinued        2 g 200 mL/hr over 30 Minutes Intravenous Every 24 hours 01/05/21 1612 01/05/21 1721   01/06/21 1415  vancomycin (VANCOREADY) IVPB 1500 mg/300 mL        1,500 mg 150 mL/hr over 120 Minutes Intravenous  Once 01/06/21 1326 01/06/21 1931   01/05/21 1845  Ampicillin-Sulbactam (UNASYN) 3 g in sodium chloride 0.9 % 100 mL IVPB  Status:  Discontinued        3 g 200 mL/hr over 30 Minutes Intravenous Every 12 hours 01/05/21 1754 01/06/21 1635   01/05/21 1645  vancomycin (VANCOCIN) IVPB 1000 mg/200 mL premix        1,000 mg 200 mL/hr over 60 Minutes Intravenous  Once 01/05/21 1513 01/05/21 1752   01/05/21 1612  vancomycin variable dose per unstable renal  function (pharmacist dosing)  Status:  Discontinued         Does not apply See admin instructions 01/05/21 1612 01/05/21 1721   01/05/21 1515  ceFEPIme (MAXIPIME) 2 g in sodium chloride 0.9 % 100 mL IVPB        2 g 200 mL/hr over 30 Minutes Intravenous  Once 01/05/21 1513 01/05/21 1648       Objective: Vitals:   01/27/21 0758 01/27/21 1135  BP: (!) 150/90 118/80  Pulse: 79 86  Resp: 18 16  Temp: 98.3 F (36.8 C) 98.1 F (36.7 C)  SpO2: 100% 98%    Intake/Output Summary (Last 24 hours) at 01/27/2021 1259 Last data filed at 01/26/2021 2032 Gross per 24 hour  Intake --  Output 950 ml  Net -950 ml    Filed Weights   01/25/21 0507 01/26/21 0500 01/27/21 0411  Weight: 62.9 kg 60.6 kg 60 kg   Weight change: -0.6 kg Body mass index is 20.11 kg/m.   Physical Exam: General exam: Pleasant, middle-aged Caucasian male.  Not in physical distress Skin: No rashes, lesions or ulcers. HEENT: Atraumatic, normocephalic, no obvious bleeding Lungs: Clear to auscultation bilaterally CVS: Regular rate and rhythm, no murmur GI/Abd soft, nontender, nondistended, and bowel sound present  CNS: Alert, awake, oriented x3. Psychiatry: Mood appropriate Extremities: No pedal edema, no calf tenderness.  Swelling, tenderness and range of movement seem to have improved on the left ankle  Data Review: I have personally reviewed the laboratory data and studies available.  F/u labs ordered Unresulted Labs (From admission, onward)     Start     Ordered   01/28/21 0500  CBC with Differential/Platelet  Tomorrow morning,   R       Question:  Specimen collection method  Answer:  Lab=Lab collect   01/27/21 1259   01/28/21 0500  Comprehensive metabolic panel  Tomorrow morning,   R       Question:  Specimen collection method  Answer:  Lab=Lab collect  01/27/21 1259   01/28/21 0500  Magnesium  Tomorrow morning,   STAT       Question:  Specimen collection method  Answer:  Lab=Lab collect   01/27/21 1259    Signed and Held  Hepatitis B surface antigen  (New Admission Hemo Labs (Hepatitis B))  Once,   R       Question:  Specimen collection method  Answer:  Lab=Lab collect   Signed and Held   Signed and Held  Hepatitis B surface antibody  (New Admission Hemo Labs (Hepatitis B))  Once,   R       Question:  Specimen collection method  Answer:  Lab=Lab collect   Signed and Held   Signed and Held  Hepatitis B surface antibody,quantitative  (New Admission Hemo Labs (Hepatitis B))  Once,   R       Question:  Specimen collection method  Answer:  Lab=Lab collect   Signed and Held            Signed, Lorin GlassBinaya Alonzo Loving, MD [facility] 01/27/2021

## 2021-01-28 LAB — CBC WITH DIFFERENTIAL/PLATELET
Abs Immature Granulocytes: 0 10*3/uL (ref 0.00–0.07)
Basophils Absolute: 0.1 10*3/uL (ref 0.0–0.1)
Basophils Relative: 2 %
Eosinophils Absolute: 0.1 10*3/uL (ref 0.0–0.5)
Eosinophils Relative: 3 %
HCT: 34.1 % — ABNORMAL LOW (ref 39.0–52.0)
Hemoglobin: 11.6 g/dL — ABNORMAL LOW (ref 13.0–17.0)
Immature Granulocytes: 0 %
Lymphocytes Relative: 55 %
Lymphs Abs: 3 10*3/uL (ref 0.7–4.0)
MCH: 32.1 pg (ref 26.0–34.0)
MCHC: 34 g/dL (ref 30.0–36.0)
MCV: 94.5 fL (ref 80.0–100.0)
Monocytes Absolute: 0.6 10*3/uL (ref 0.1–1.0)
Monocytes Relative: 12 %
Neutro Abs: 1.5 10*3/uL — ABNORMAL LOW (ref 1.7–7.7)
Neutrophils Relative %: 28 %
Platelets: 631 10*3/uL — ABNORMAL HIGH (ref 150–400)
RBC: 3.61 MIL/uL — ABNORMAL LOW (ref 4.22–5.81)
RDW: 13.4 % (ref 11.5–15.5)
WBC: 5.4 10*3/uL (ref 4.0–10.5)
nRBC: 0 % (ref 0.0–0.2)

## 2021-01-28 LAB — COMPREHENSIVE METABOLIC PANEL
ALT: 22 U/L (ref 0–44)
AST: 22 U/L (ref 15–41)
Albumin: 4 g/dL (ref 3.5–5.0)
Alkaline Phosphatase: 52 U/L (ref 38–126)
Anion gap: 9 (ref 5–15)
BUN: 20 mg/dL (ref 6–20)
CO2: 23 mmol/L (ref 22–32)
Calcium: 9.5 mg/dL (ref 8.9–10.3)
Chloride: 102 mmol/L (ref 98–111)
Creatinine, Ser: 0.9 mg/dL (ref 0.61–1.24)
GFR, Estimated: >60 mL/min (ref >60)
Glucose, Bld: 97 mg/dL (ref 70–99)
Potassium: 3.6 mmol/L (ref 3.5–5.1)
Sodium: 134 mmol/L — ABNORMAL LOW (ref 135–145)
Total Bilirubin: 0.7 mg/dL (ref 0.3–1.2)
Total Protein: 8.1 g/dL (ref 6.5–8.1)

## 2021-01-28 LAB — MAGNESIUM: Magnesium: 2.1 mg/dL (ref 1.7–2.4)

## 2021-01-28 NOTE — Progress Notes (Signed)
Pt spit out his morning meds and was documented on [facility], note sent to MD of same.

## 2021-01-28 NOTE — Progress Notes (Signed)
Occupational Therapy Treatment Patient Details Name: Steve [name] MRN: [id number] DOB: 1965/05/31 Today's Date: 01/28/2021   History of present illness Pt is a 56 y/o male who presented on 01/05/21 from his group home (drug/alcohol rehab?) s/p witnessed seizure and AMS. He was found to have AKI and rhabdomyolysis. Pt with c/o L ankle pain since 01/16/21 - imaging negative, ortho consulted.   No PMH noted in the chart.   OT comments  Pt NOT progressing towards OT goals this session. He is refusing any OOB activity at this time, no matter what the incentive. Concern over paranoid-like behavior during session (messaged MD) Pt found in puddle of urine with it all over the floor. Pt required max encouragement to allow OT/PT to change linens - during this time pt was able to demonstrate good bed mobility (scooting up, scooting down, bridge bearing weight through BLE) at mod I level. He was set up level for grooming and bathing. Pt was calm in temperament throughout - but he is very restless, and largely did not participate in therapy. OT will continue to follow.   Recommendations for follow up therapy are one component of a multi-disciplinary discharge planning process, led by the attending physician.  Recommendations may be updated based on patient status, additional functional criteria and insurance authorization.    Follow Up Recommendations  Other (comment) (psych?)    Assistance Recommended at Discharge Frequent or constant Supervision/Assistance  Patient can return home with the following      Equipment Recommendations       Recommendations for Other Services      Precautions / Restrictions Precautions Precautions: Fall Precaution Comments: L foot pain Restrictions Weight Bearing Restrictions: No       Mobility Bed Mobility Overal bed mobility: Modified Independent Bed Mobility:  (Long Sitting in Bed)           General bed mobility comments: Pt able to move up the bed, down  the bed, come to long sitting and bridge without indication of pain or physical assist    Transfers                   General transfer comment: Pt refused despite multiple attempts and attempts to motivate the patient     Balance Overall balance assessment: Needs assistance Sitting-balance support:  (long sitting in bed) Sitting balance-Leahy Scale: Good                                     ADL either performed or assessed with clinical judgement   ADL Overall ADL's : Needs assistance/impaired             Lower Body Bathing: Set up;Bed level Lower Body Bathing Details (indicate cue type and reason): Pt able to perform bridge in bed at independent level Upper Body Dressing : Set up (long sitting in bed)   Lower Body Dressing: Set up;Bed level Lower Body Dressing Details (indicate cue type and reason): Pt initailly wanted scrub pants, then declined               General ADL Comments: Pt peed all over himself/the bed/off the bed? and required max verbal encouragement to clean up and    Extremity/Trunk Assessment Upper Extremity Assessment Upper Extremity Assessment: Overall WFL for tasks assessed            Vision       Perception  Praxis      Cognition Arousal/Alertness: Awake/alert Behavior During Therapy: Flat affect (paranoia-like behavior) Overall Cognitive Status: No family/caregiver present to determine baseline cognitive functioning                         Following Commands: Follows one step commands inconsistently Safety/Judgement: Decreased awareness of deficits;Decreased awareness of safety Awareness: Intellectual   General Comments: Pt presenting with paranoid-like behavior, following some commands, but refusing to follow others, can verbalize understanding of why he needs to complete a task i.e. get out of bed for linen change, but then continues to lay flat in bed and does not verbally respond to  therapists. Messaged MD about concerns          Exercises     Shoulder Instructions       General Comments      Pertinent Vitals/ Pain       Pain Assessment: No/denies pain Pain Intervention(s): Monitored during session  Home Living                                          Prior Functioning/Environment              Frequency  Min 2X/week        Progress Toward Goals  OT Goals(current goals can now be found in the care plan section)  Progress towards OT goals: Not progressing toward goals - comment (declining OOB activity)  Acute Rehab OT Goals Patient Stated Goal: none stated Time For Goal Achievement: 01/30/21 Potential to Achieve Goals: Fair  Plan Discharge plan remains appropriate;Other (comment) (pending psych eval)    Co-evaluation    PT/OT/SLP Co-Evaluation/Treatment: Yes Reason for Co-Treatment: Necessary to address cognition/behavior during functional activity;To address functional/ADL transfers;For patient/therapist safety PT goals addressed during session: Mobility/safety with mobility OT goals addressed during session: ADL's and self-care      AM-PAC OT "6 Clicks" Daily Activity     Outcome Measure   Help from another person eating meals?: None Help from another person taking care of personal grooming?: A Little Help from another person toileting, which includes using toliet, bedpan, or urinal?: A Lot Help from another person bathing (including washing, rinsing, drying)?: A Lot Help from another person to put on and taking off regular upper body clothing?: A Little Help from another person to put on and taking off regular lower body clothing?: A Little 6 Click Score: 17    End of Session    OT Visit Diagnosis: Unsteadiness on feet (R26.81);Muscle weakness (generalized) (M62.81);Dizziness and giddiness (R42)   Activity Tolerance Treatment limited secondary to agitation   Patient Left in bed;with call bell/phone within  reach;with chair alarm set   Nurse Communication Mobility status        Time: 4431-5400 OT Time Calculation (min): 31 min  Charges: OT General Charges $OT Visit: 1 Visit OT Treatments $Self Care/Home Management : 8-22 mins Nyoka Cowden OTR/L Acute Rehabilitation Services Pager: [phone] Office: [phone]  Evern Bio Steve [name] 01/28/2021, 2:04 PM

## 2021-01-28 NOTE — Progress Notes (Addendum)
Physical Therapy Treatment Patient Details Name: Steve [name] MRN: [id number] DOB: [birth year] Today's Date: 01/28/2021   History of Present Illness Pt is a 56 y.o. male admitted from group home (substance abuse rehab?) on 01/05/21 with witnessed seizure, AMS. 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 EEG unremarkable. LP on 12/21 showed staph in CSF, felt to be contaminant. Workup for AKI, rhabdomyolysis. Course complicated by c/o L foot pain; imaging negative for acute injury. No PMH in chart.   PT Comments    Pt not progressing with mobility; unable to encourage OOB activity this session despite max encouragement/education and multiple strategies to engage pt in session. Pt demonstrates paranoia-like(?) behavior, difficult to determine true cognitive impairment versus desire to participate; MD notified of concerns. Pt mod indep with bed-level mobility; noted great LLE strength and motion at bed-level, no signs of pain. Difficult to determine best discharge recommendations due to inconsistent participation with therapies. Will continue to follow acutely.    Recommendations for follow up therapy are one component of a multi-disciplinary discharge planning process, led by the attending physician.  Recommendations may be updated based on patient status, additional functional criteria and insurance authorization.  Follow Up Recommendations  Other (SNF vs. Behavioral Health?)     Assistance Recommended at Discharge Frequent or constant Supervision/Assistance  Patient can return home with the following A little help with walking and/or transfers;A little help with bathing/dressing/bathroom;Assistance with cooking/housework;Assist for transportation;Direct supervision/assist for financial management;Direct supervision/assist for medications management;Help with stairs or ramp for  entrance (difficult to determine)   Equipment Recommendations  Rolling walker (2 wheels);Wheelchair (measurements PT)    Recommendations for Other Services  Psychiatry (attending MD notified)     Precautions / Restrictions Precautions Precautions: Fall Precaution Comments: h/o L foot pain (moving LLE functionally at bed-level without issue) Restrictions Weight Bearing Restrictions: No     Mobility  Bed Mobility Overal bed mobility: Modified Independent Bed Mobility:  (Long Sitting in Bed)           General bed mobility comments: Pt able to move up the bed, down the bed, come to long sitting and bridge without indication of pain or physical assist    Transfers                   General transfer comment: Pt refused despite multiple attempts and attempts to motivate the patient    Ambulation/Gait                   Stairs             Wheelchair Mobility    Modified Rankin (Stroke Patients Only)       Balance Overall balance assessment: Needs assistance Sitting-balance support:  (long sitting) Sitting balance-Leahy Scale: Good                                      Cognition Arousal/Alertness: Awake/alert Behavior During Therapy: Flat affect (paranoia-like behavior) Overall Cognitive Status: No family/caregiver present to determine baseline cognitive functioning Area of Impairment: Orientation;Attention;Memory;Following commands;Safety/judgement;Awareness;Problem solving                   Current Attention Level: Focused;Sustained Memory: Decreased short-term memory Following Commands: Follows one step commands inconsistently Safety/Judgement: Decreased awareness of deficits;Decreased awareness of safety Awareness: Intellectual Problem Solving:  Slow processing;Requires verbal cues;Requires tactile cues;Decreased initiation General Comments: Pt presenting with paranoid-like behavior, following some commands, but  refusing to follow others, can verbalize understanding of why he needs to complete a task (i.e. get out of bed for linen change), but then continues to lay flat in bed and does not verbally respond to therapists. Messaged MD about psych-related concerns        Exercises      General Comments        Pertinent Vitals/Pain Pain Assessment: No/denies pain Breathing: normal Negative Vocalization: occasional moan/groan, low speech, negative/disapproving quality Facial Expression: smiling or inexpressive Body Language: relaxed Consolability: no need to console PAINAD Score: 1 Pain Intervention(s): Monitored during session    Home Living                          Prior Function            PT Goals (current goals can now be found in the care plan section) Acute Rehab PT Goals Patient Stated Goal: None stated PT Goal Formulation: With patient Time For Goal Achievement: 02/11/21 Potential to Achieve Goals: Fair Progress towards PT goals: Not progressing toward goals - comment    Frequency    Min 2X/week      PT Plan Discharge plan needs to be updated    Co-evaluation   Reason for Co-Treatment: Necessary to address cognition/behavior during functional activity;To address functional/ADL transfers;For patient/therapist safety PT goals addressed during session: Mobility/safety with mobility OT goals addressed during session: ADL's and self-care      AM-PAC PT "6 Clicks" Mobility   Outcome Measure  Help needed turning from your back to your side while in a flat bed without using bedrails?: None Help needed moving from lying on your back to sitting on the side of a flat bed without using bedrails?: None Help needed moving to and from a bed to a chair (including a wheelchair)?: A Lot Help needed standing up from a chair using your arms (e.g., wheelchair or bedside chair)?: A Lot Help needed to walk in hospital room?: Total Help needed climbing 3-5 steps with a  railing? : Total 6 Click Score: 14    End of Session   Activity Tolerance: Other (comment) (limited by cognition/desire(?)) Patient left: in bed;with call bell/phone within reach;with bed alarm set Nurse Communication: Mobility status PT Visit Diagnosis: Unsteadiness on feet (R26.81);Pain Pain - Right/Left: Left Pain - part of body: Ankle and joints of foot     Time: 2703-5009 PT Time Calculation (min) (ACUTE ONLY): 28 min  Charges:  $Self Care/Home Management: 8-22                     Ina Homes, PT, DPT Acute Rehabilitation Services  Pager [phone] Office [phone]  Malachy Chamber 01/28/2021, 3:39 PM

## 2021-01-28 NOTE — Progress Notes (Signed)
PROGRESS NOTE  Steve SaverJames Pascale  DOB: [birth year]  PCP: No primary care provider on file. ZDG:[id number]  DOA: 01/05/2021  LOS: 23 days  Hospital Day: 24  Chief Complaint  Patient presents with   Altered Mental Status   Seizures    Brief narrative: Steve [name] is a 56 y.o. male with history of major depression, anxiety who was in a group home for last 5 months. He was brought to the ED on 12/20 from a group home for convalescence, combativeness and altered sensorium.  Per collateral history, patient was not acting typical for last few days. In the ED, patient was confused, combative Labs showed AKI with creatinine of 3.27, rhabdomyolysis, abnormal LFTs, elevated WBC count at 23.7 and lactic acid level at 7.6. 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  IV fluid resuscitation started Patient was admitted to ICU. 12/21, underwent LP which showed staph in CSF, felt to be contaminant.  EEG did not show any seizures 12/24, HD catheter was placed but patient did not require HD as creatinine started to improve 12/31, with clinical improvement, [facility]  His main problem over the last several days has been severe pain in the left lower extremity affecting his ambulation because of which he has not been able to be discharged.  Subjective: Patient was seen and examined this morning.  Lying on bed.  Not in distress.  States that his left leg pain is improving.  He is currently not on any pain medicines. He also states that he is able to walk to the bathroom with no assistance but does not perform more than few feet on evaluation by PT.  I do not think he is being honest to us about his level of weakness.  Assessment/Plan: Left lower extremity pain, weakness. -Patient had swelling, pain and  tenderness and decreased range of motion of left ankle.  X-ray and MRI unremarkable.  Orthopedic consult obtained.  No local structural pathology identified.  No new recommendation. -MRI lumbar spine was obtained. It shows small foraminal disc protrusion at multiple levels from T11-L5.  I discussed the case with neurosurgeon Dr. Lovell SheehanJenkins this morning.  He stated that the MRI is very close to normal and is not the primary cause of his left ankle issues. -PTA, patient was on Neurontin 400 mg 3 times daily.  It was held on admission because of significant AKI.  After improvement in renal function, Neurontin was resumed at a higher dose.  - -Swelling seems to have been completely resolved.  He is examination shows inconsistent findings when distracted.  He states he is able to walk to the bathroom by himself but not able to perform few feeds on examination by PT.  I do not think he is being honest to us about his level of weakness.  Seizure -Generalized convulsion was noted by his roommate prior to presentation. -EEG unremarkable.  Neurology consult appreciated.  Currently on Vimpat 100 mg twice daily given renal failure and possibility of Keppra contributing to agitation.  Acute renal failure secondary to rhabdomyolysis Acute metabolic acidosis -His creatinine continue to worsen to peak at 7.48 on 12/26.  Nephrology consult was obtained.  He was prepped for dialysis, had a HD catheter placed but never needed dialysis.  -Creatinine gradually improved to normal.  Serum bicarb level improved as well. -  Nephrology consult appreciated  Rhabdomyolysis -Primary cause of AKI.  CK level improved with IV fluid.   Uremic encephalopathy -Was combative on admission. Mental status improved with improvement in uremia. -Patient states he has history of major depression and anxiety.   -Currently he is on Prozac 40 mg daily, Seroquel 100 mg twice daily, BuSpar 10 mg twice daily, trazodone 50 mg nightly.    Ruled out  upper extremity DVT -Per critical care note, hemodialysis cath had a clot on it when it was removed.  Ultrasound duplex scan of upper extremities was obtained on 12/31 which did not show any evidence of DVT.  Because of his risk of falls, noncompliance, recurrent seizures, I would avoid long-term anticoagulation without definite evidence of DVT.    Aspiration pneumonia -Completed a course of Zosyn  Acute liver injury -His AST and ALT were significantly elevated to over 700s on admission.  Gradually improved to normal. Recent Labs  Lab 01/28/21 0208  AST 22  ALT 22  ALKPHOS 52  BILITOT 0.7  PROT 8.1  ALBUMIN 4.0   Evidence of right ventricular dysfunction and recent echocardiogram -Echo 1/2 with EF 60 to 65%.  Cannot rule out a small PFO. -Not clinically significant as patient has no symptoms related to it. Continue to monitor as an outpatient.  Mobility: Encourage ambulation Living condition: Group home Goals of care:   Code Status: Full Code  Nutritional status: Body mass index is 20.11 kg/m.      Diet:  Diet Order             Diet regular Room service appropriate? Yes; Fluid consistency: Thin  Diet effective now           Diet general                  DVT prophylaxis:  heparin injection 5,000 Units Start: 01/16/21 1545   Antimicrobials: None Fluid: None Consultants: Nephrology Family Communication: None at bedside  Status is: Inpatient  Continue in-hospital care because: Patient continues to show signs of weakness on evaluation by PT.  I have reordered PT evaluation again today. Level of care: Med-Surg   Dispo: The patient is from: Group home              Anticipated d/c is to: Group home versus home.  No medical insurance for SNF discharge              Patient currently is not medically stable to d/c.   Difficult to place patient No     Infusions:   sodium chloride Stopped (01/08/21 2354)    Scheduled Meds:  amLODipine  2.5 mg Oral Daily    aspirin  325 mg Oral Daily   Or   aspirin  300 mg Rectal Daily   busPIRone  10 mg Oral BID   docusate sodium  100 mg Oral BID   feeding supplement  237 mL Oral BID BM   FLUoxetine  40 mg Oral Daily   folic acid  1 mg Oral Daily   gabapentin  600 mg Oral TID   heparin injection (subcutaneous)  5,000 Units Subcutaneous Q8H   lacosamide  100 mg Oral BID   lidocaine  1 patch Transdermal Q24H   mouth rinse  15 mL Mouth Rinse BID   nicotine  21 mg Transdermal Daily   polyethylene glycol  17 g Oral Daily   QUEtiapine  100 mg Oral BID   sodium chloride flush  3 mL Intravenous Q12H  traZODone  50 mg Oral QHS    PRN meds: sodium chloride, albuterol, docusate sodium, ondansetron (ZOFRAN) IV, polyethylene glycol, sodium chloride flush   Antimicrobials: Anti-infectives (From admission, onward)    Start     Dose/Rate Route Frequency Ordered Stop   01/07/21 1500  vancomycin (VANCOREADY) IVPB 750 mg/150 mL  Status:  Discontinued        750 mg 150 mL/hr over 60 Minutes Intravenous Every 24 hours 01/06/21 1326 01/07/21 0802   01/07/21 0802  vancomycin variable dose per unstable renal function (pharmacist dosing)  Status:  Discontinued         Does not apply See admin instructions 01/07/21 0802 01/07/21 1000   01/06/21 2200  cefTRIAXone (ROCEPHIN) 2 g in sodium chloride 0.9 % 100 mL IVPB  Status:  Discontinued        2 g 200 mL/hr over 30 Minutes Intravenous Every 12 hours 01/06/21 1635 01/07/21 1000   01/06/21 1600  ceFEPIme (MAXIPIME) 2 g in sodium chloride 0.9 % 100 mL IVPB  Status:  Discontinued        2 g 200 mL/hr over 30 Minutes Intravenous Every 24 hours 01/05/21 1612 01/05/21 1721   01/06/21 1415  vancomycin (VANCOREADY) IVPB 1500 mg/300 mL        1,500 mg 150 mL/hr over 120 Minutes Intravenous  Once 01/06/21 1326 01/06/21 1931   01/05/21 1845  Ampicillin-Sulbactam (UNASYN) 3 g in sodium chloride 0.9 % 100 mL IVPB  Status:  Discontinued        3 g 200 mL/hr over 30 Minutes  Intravenous Every 12 hours 01/05/21 1754 01/06/21 1635   01/05/21 1645  vancomycin (VANCOCIN) IVPB 1000 mg/200 mL premix        1,000 mg 200 mL/hr over 60 Minutes Intravenous  Once 01/05/21 1513 01/05/21 1752   01/05/21 1612  vancomycin variable dose per unstable renal function (pharmacist dosing)  Status:  Discontinued         Does not apply See admin instructions 01/05/21 1612 01/05/21 1721   01/05/21 1515  ceFEPIme (MAXIPIME) 2 g in sodium chloride 0.9 % 100 mL IVPB        2 g 200 mL/hr over 30 Minutes Intravenous  Once 01/05/21 1513 01/05/21 1648       Objective: Vitals:   01/28/21 0331 01/28/21 0731  BP: (!) 136/96 (!) 142/97  Pulse: 83 80  Resp: 18 20  Temp: 98 F (36.7 C) 98.4 F (36.9 C)  SpO2: 100% 99%    Intake/Output Summary (Last 24 hours) at 01/28/2021 1032 Last data filed at 01/28/2021 0300 Gross per 24 hour  Intake --  Output 200 ml  Net -200 ml    Filed Weights   01/26/21 0500 01/27/21 0411 01/28/21 0339  Weight: 60.6 kg 60 kg 60 kg   Weight change: 0 kg Body mass index is 20.11 kg/m.   Physical Exam: General exam: Pleasant, middle-aged Caucasian male.  Not in physical distress Skin: No rashes, lesions or ulcers. HEENT: Atraumatic, normocephalic, no obvious bleeding Lungs: Clear to auscultation bilaterally CVS: Regular rate and rhythm, no murmur GI/Abd soft, nontender, nondistended, and bowel sound present  CNS: Alert, awake, oriented x3. Psychiatry: Mood appropriate Extremities: No pedal edema, no calf tenderness.  Swelling of left ankle completely resolved.  He does not have sensitivity in the left foot or ankle anymore.  Exam findings with inconsistent level of strength.    Data Review: I have personally reviewed the laboratory data and studies available.  F/u labs ordered [employer] (From admission, onward)     Start     Ordered   Signed and Held  Hepatitis B surface antigen  (New Admission Hemo Labs (Hepatitis B))  Once,   R        Question:  Specimen collection method  Answer:  Lab=Lab collect   Signed and Held   Signed and Held  Hepatitis B surface antibody  (New Admission Hemo Labs (Hepatitis B))  Once,   R       Question:  Specimen collection method  Answer:  Lab=Lab collect   Signed and Held   Signed and Held  Hepatitis B surface antibody,quantitative  (New Admission Hemo Labs (Hepatitis B))  Once,   R       Question:  Specimen collection method  Answer:  Lab=Lab collect   Signed and Held            Signed, Lorin GlassBinaya Hargun Spurling, MD [facility] 01/28/2021

## 2021-01-28 NOTE — Plan of Care (Signed)
  Problem: Education: Goal: Knowledge of General Education information will improve Description: Including pain rating scale, medication(s)/side effects and non-pharmacologic comfort measures Outcome: Progressing   Problem: Health Behavior/Discharge Planning: Goal: Ability to manage health-related needs will improve Outcome: Progressing   Problem: Clinical Measurements: Goal: Ability to maintain clinical measurements within normal limits will improve Outcome: Progressing   Problem: Activity: Goal: Risk for activity intolerance will decrease Outcome: Progressing   Problem: Coping: Goal: Level of anxiety will decrease Outcome: Progressing   Problem: Skin Integrity: Goal: Risk for impaired skin integrity will decrease Outcome: Progressing   

## 2021-01-29 MED ORDER — QUETIAPINE FUMARATE 100 MG PO TABS
100.0000 mg | ORAL_TABLET | Freq: Every day | ORAL | Status: DC
  Administered 2021-01-30 – 2021-02-01 (×3): 100 mg via ORAL
  Filled 2021-01-29 (×3): qty 1

## 2021-01-29 MED ORDER — QUETIAPINE FUMARATE 50 MG PO TABS
50.0000 mg | ORAL_TABLET | Freq: Every day | ORAL | Status: DC
  Administered 2021-01-30 – 2021-02-02 (×4): 50 mg via ORAL
  Filled 2021-01-29 (×4): qty 1

## 2021-01-29 NOTE — Consult Note (Addendum)
[facility] Consult   Reason for Consult: MDD affecting care , ? malingering behavior Referring Physician:  Dr Pietro Cassis  Patient Identification: Steve [name] MRN:  [id number] Principal Diagnosis: Acute metabolic encephalopathy Diagnosis:  Principal Problem:   Acute metabolic encephalopathy Active Problems:   Severe sepsis without septic shock ([name])   Fever   Pressure injury of skin   Total Time spent with patient: 30 minutes HPI:  Steve [name] is a 56 y.o. male patient with past psychiatric history significant for MDD, anxiety who initially presented to ED on 12/20 for full body seizure, AMS and combativeness.  Per collateral patient was not acting typical.  Patient was found to have rhabdomyolysis leading to uremia and encephalopathy, and abnormal LFTs.  Psych consult MDD affecting care ?Malingering behavior.  Patient is on Prozac, BuSpar, Neurontin, Seroquel (not a home medication).  Seroquel was started in the hospital for ?encephalopathy.  Patient home medication include Neurontin 400 mg 3 times daily, Vistaril 50 mg every 4 hours needed for anxiety, Prozac 40 mg daily and trazodone 50 mg at bedtime.   Labs reviewed-CMP (sodium 134), vitamin B12(within normal limits), ammonia(within normal limits), CBC (Hb11.6), AST, ALT, alcohol level (Steve than 10), UDS (positive for benzo).   Subjective: Patient is seen today.  Patient is poor historian with limited interaction.  Patient is selectively mute and only answers questions when he wants to provide information. Patient sometimes answer question in 1 or 2 words and short sentences and sometimes did not answer.  He was able to follow commands. Patient denies any depression or anxiety.  He denies suicidal ideation, homicidal ideation, auditory or visual hallucinations.  When asked if he has a diagnosis of depression or anxiety, He states "yes".  He has been sleeping and eating well.  He reports that he drinks alcohol occasionally but  does not use any illicit drugs.  He was compliant with his psych medications.  Patient was at sober living of [place] for last 1 month.  He has a son who lives in [place]. Per primary, patient was more talkative before and has not been getting up recently. Patient is oriented to self, place but not oriented to time, situation.  On examination, mild rigidity noted in upper extremities.  Weakness in all 4 extremities.  Able to follow commands. Patient was seen again with attending Dr. Dwyane Dee.  Patient was selectively mute and answered questions only when he wanted to.  He did not provide much information about where he was living before [facility].  He states he had been living at [facility] for 1 month.  He states he was not working at [employer] and did not know he had to work.  He states he was working before.  He has a son who lives in [place].  He states he lived in [place] independently for 3 years in the past.  Denies SI, HI, AVH.  Past Psychiatric History: MDD, anxiety.  Vistaril 50 mg every 4 hours needed for anxiety, Prozac 40 mg daily and trazodone 50 mg at bedtime.  Risk to Self:   Risk to Others:   Prior Inpatient Therapy:   Prior Outpatient Therapy:    Past Medical History: History reviewed. No pertinent past medical history. History reviewed. No pertinent surgical history. Family History: History reviewed. No pertinent family history. Family Psychiatric  History:  Social History:  Social History   Substance and Sexual Activity  Alcohol Use None     Social History   Substance and Sexual Activity  Drug Use Not on file    Social History   Socioeconomic History   Marital status: Unknown    Spouse name: Not on file   Number of children: Not on file   Years of education: Not on file   Highest education level: Not on file  Occupational History   Not on file  Tobacco Use   Smoking status: Every Day    Packs/day: 0.50    Types: Cigarettes   Smokeless tobacco: Current  Substance and Sexual  Activity   Alcohol use: Not on file   Drug use: Not on file   Sexual activity: Not on file  Other Topics Concern   Not on file  Social History Narrative   Not on file   Social Determinants of Health   Financial Resource Strain: Not on file  Food Insecurity: Not on file  Transportation Needs: Not on file  Physical Activity: Not on file  Stress: Not on file  Social Connections: Not on file   Additional Social History:    Allergies:  No Known Allergies  Labs:  Results for orders placed or performed during the hospital encounter of 01/05/21 (from the past 48 hour(s))  CBC with Differential/Platelet     Status: Abnormal   Collection Time: 01/28/21  2:08 AM  Result Value Ref Range   WBC 5.4 4.0 - 10.5 K/uL   RBC 3.61 (L) 4.22 - 5.81 MIL/uL   Hemoglobin 11.6 (L) 13.0 - 17.0 g/dL   HCT 34.1 (L) 39.0 - 52.0 %   MCV 94.5 80.0 - 100.0 fL   MCH 32.1 26.0 - 34.0 pg   MCHC 34.0 30.0 - 36.0 g/dL   RDW 13.4 11.5 - 15.5 %   Platelets 631 (H) 150 - 400 K/uL   nRBC 0.0 0.0 - 0.2 %   Neutrophils Relative % 28 %   Neutro Abs 1.5 (L) 1.7 - 7.7 K/uL   Lymphocytes Relative 55 %   Lymphs Abs 3.0 0.7 - 4.0 K/uL   Monocytes Relative 12 %   Monocytes Absolute 0.6 0.1 - 1.0 K/uL   Eosinophils Relative 3 %   Eosinophils Absolute 0.1 0.0 - 0.5 K/uL   Basophils Relative 2 %   Basophils Absolute 0.1 0.0 - 0.1 K/uL   Immature Granulocytes 0 %   Abs Immature Granulocytes 0.00 0.00 - 0.07 K/uL    Comment: Performed at [facility], [address] [place], [place] [address]  Comprehensive metabolic panel     Status: Abnormal   Collection Time: 01/28/21  2:08 AM  Result Value Ref Range   Sodium 134 (L) 135 - 145 mmol/L   Potassium 3.6 3.5 - 5.1 mmol/L   Chloride 102 98 - 111 mmol/L   CO2 23 22 - 32 mmol/L   Glucose, Bld 97 70 - 99 mg/dL    Comment: Glucose reference range applies only to samples taken after fasting for at least 8 hours.   BUN 20 6 - 20 mg/dL   Creatinine, Ser 0.90 0.61  - 1.24 mg/dL   Calcium 9.5 8.9 - 10.3 mg/dL   Total Protein 8.1 6.5 - 8.1 g/dL   Albumin 4.0 3.5 - 5.0 g/dL   AST 22 15 - 41 U/L   ALT 22 0 - 44 U/L   Alkaline Phosphatase 52 38 - 126 U/L   Total Bilirubin 0.7 0.3 - 1.2 mg/dL   GFR, Estimated >60 >60 mL/min    Comment: (NOTE) Calculated using the CKD-EPI Creatinine Equation (  2021)    Anion gap 9 5 - 15    Comment: Performed at [facility], [place] [address] [place], [place] [address]  Magnesium     Status: None   Collection Time: 01/28/21  2:08 AM  Result Value Ref Range   Magnesium 2.1 1.7 - 2.4 mg/dL    Comment: Performed at [facility] [address], [place], [place] [address]    Current Facility-Administered Medications  Medication Dose Route Frequency Provider Last Rate Last Admin   0.9 %  sodium chloride infusion   Intravenous PRN Margaretha Seeds, MD   Stopped at 01/08/21 2354   albuterol (PROVENTIL) (2.5 MG/3ML) 0.083% nebulizer solution 2.5 mg  2.5 mg Nebulization Q3H PRN Anders Simmonds, MD   2.5 mg at 01/13/21 0233   amLODipine (NORVASC) tablet 2.5 mg  2.5 mg Oral Daily Dahal, Marlowe Aschoff, MD   2.5 mg at 01/29/21 [id number]   aspirin tablet 325 mg  325 mg Oral Daily Greta Doom, MD   325 mg at 01/29/21 [id number]   Or   aspirin suppository 300 mg  300 mg Rectal Daily Greta Doom, MD       busPIRone (BUSPAR) tablet 10 mg  10 mg Oral BID Olalere, Adewale A, MD   10 mg at 01/29/21 0827   docusate sodium (COLACE) capsule 100 mg  100 mg Oral BID PRN Erick Colace, NP   100 mg at 01/13/21 1945   docusate sodium (COLACE) capsule 100 mg  100 mg Oral BID Spero Geralds, MD   100 mg at 01/29/21 0825   feeding supplement (ENSURE ENLIVE / ENSURE PLUS) liquid 237 mL  237 mL Oral BID BM Dahal, Binaya, MD   237 mL at 01/29/21 1314   FLUoxetine (PROZAC) capsule 40 mg  40 mg Oral Daily Dahal, Marlowe Aschoff, MD   40 mg at [id number]   folic acid (FOLVITE) tablet 1 mg  1 mg Oral Daily Vann, Jessica U, DO   1 mg at  01/29/21 0827   gabapentin (NEURONTIN) capsule 600 mg  600 mg Oral TID Eulogio Bear U, DO   600 mg at 01/29/21 0826   heparin injection 5,000 Units  5,000 Units Subcutaneous Q8H Dahal, Marlowe Aschoff, MD   5,000 Units at 01/29/21 1314   lacosamide (VIMPAT) tablet 100 mg  100 mg Oral BID Irene Pap N, DO   100 mg at 01/29/21 0826   lidocaine (LIDODERM) 5 % 1 patch  1 patch Transdermal Q24H Vann, Jessica U, DO   1 patch at 01/29/21 1313   MEDLINE mouth rinse  15 mL Mouth Rinse BID Olalere, Adewale A, MD   15 mL at 01/29/21 0828   nicotine (NICODERM CQ - dosed in mg/24 hours) patch 21 mg  21 mg Transdermal Daily Dahal, Binaya, MD   21 mg at 01/29/21 0828   ondansetron (ZOFRAN) injection 4 mg  4 mg Intravenous Q8H PRN Margaretmary Lombard, MD   4 mg at 01/15/21 0208   polyethylene glycol (MIRALAX / GLYCOLAX) packet 17 g  17 g Oral Daily PRN Erick Colace, NP   17 g at 01/29/21 0827   polyethylene glycol (MIRALAX / GLYCOLAX) packet 17 g  17 g Oral Daily Spero Geralds, MD   17 g at 01/28/21 1012   QUEtiapine (SEROQUEL) tablet 100 mg  100 mg Oral BID Olalere, Adewale A, MD   100 mg at 01/29/21 0826   sodium chloride flush (NS) 0.9 %  injection 3 mL  3 mL Intravenous PRN Spero Geralds, MD       sodium chloride flush (NS) 0.9 % injection 3 mL  3 mL Intravenous Q12H Spero Geralds, MD   3 mL at 01/29/21 0829   traZODone (DESYREL) tablet 50 mg  50 mg Oral QHS Terrilee Croak, MD   50 mg at 01/28/21 2247    Musculoskeletal: Strength & Muscle Tone: decreased Gait & Station:  Deffered Patient leans: Backward            Psychiatric Specialty Exam:  Presentation  General Appearance: Appropriate for Environment  Eye Contact:Minimal  Speech:Slow; Blocked  Speech Volume:Normal  Handedness:No data recorded  Mood and Affect  Mood:Euthymic  Affect:Non-Congruent; Flat   Thought Process  Thought Processes:-- (No SI, HI)  Descriptions of Associations:-- (Not able to assess fully due to  limited  interaction)  Orientation:No data recorded Thought Content:-- (No SI, HI. Not able to assess fully due to  limited interaction)  History of Schizophrenia/Schizoaffective disorder:No data recorded Duration of Psychotic Symptoms:No data recorded Hallucinations:Hallucinations: None  Ideas of Reference:None  Suicidal Thoughts:Suicidal Thoughts: No  Homicidal Thoughts:Homicidal Thoughts: No   Sensorium  Memory:Remote Poor; Immediate Poor; Recent Poor  Judgment:Impaired  Insight:Lacking   Executive Functions  Concentration:Poor  Attention Span:Poor  Recall:Poor  Fund of Knowledge:Poor  Language:Poor   Psychomotor Activity  Psychomotor Activity:Psychomotor Activity: Decreased   Assets  Assets:No data recorded  Sleep  Sleep:Sleep: Good   Physical Exam: Physical Exam Review of Systems  Reason unable to perform ROS: Due to limited Pt interation.  Psychiatric/Behavioral:  Negative for depression, hallucinations and suicidal ideas. The patient is not nervous/anxious and does not have insomnia.   Blood pressure 116/79, pulse 77, temperature 98.4 F (36.9 C), temperature source Oral, resp. rate 20, height 5\' 8"  (1.727 m), weight 60 kg, SpO2 100 %. Body mass index is 20.11 kg/m.  Treatment Plan Summary: Levaughn Woitas is a 56 y.o. male patient with past psychiatric history significant for MDD, anxiety who initially presented to ED on 12/20 for full body seizure, AMS and combativeness.  Per collateral patient was not acting typical.  Patient was found to have rhabdomyolysis leading to uremia and encephalopathy, and abnormal LFTs.  Psych consult MDD affecting care ?Malingering behavior.   Patient is very guarded and selectively mute. He only spoke when he wanted to give some information.  There  may be likely an element of secondary gain. Will need more collateral information from son and other sources. CSW informed.  Limited collateral information available at this time. TOC  involved per CSW.  Recommendations  -Need more collateral information from son and others sources. CSW informed.  Per CSW, Limited collateral information available at this time. TOC involved per CSW.  -Continue Prozac 40 mg daily -Decrease Seroquel to 50 mg morning and 100 mg at night. ( not a home medication).  we will taper it further.  -Continue Buspar 100 mg twice daily -Continue trazodone 50 mg nightly  Thank you for this consult.  Psychiatry will continue to follow and see patient on Monday.  Disposition: Patient does not meet criteria for psychiatric inpatient admission. Not clear at this time. Will need more information.   Armando Reichert, MD 01/29/2021 3:18 PM

## 2021-01-29 NOTE — TOC Progression Note (Signed)
Transition of Care [name]) - Progression Note    Patient Details  Name: Steve [name] MRN: [id number] Date of Birth: [birth year]  Transition [facility]) CM/SW Contact  Carley Hammed, [place] Phone Number: 01/29/2021, 3:48 PM  Clinical Narrative:    CSW discussed this patient with difficult to place social worker and transitions of care supervisor, Macario Golds. At this time a discharge plan has not been identified. Supervisor noted they will look at patient this coming week to identify a plan moving forward. Pt's son does not have any further collateral information, and is not able to assist with discharge needs. Pt's sober living is not able to take him back until he is ambulatory. Medical team updated and TOC will continue to follow for disposition.        Expected Discharge Plan and Services           Expected Discharge Date: 01/24/21                                     Social Determinants of Health (SDOH) Interventions    Readmission Risk Interventions No flowsheet data found.

## 2021-01-29 NOTE — Progress Notes (Signed)
PROGRESS NOTE  Steve [name]  DOB: 09/10/1965  PCP: No primary care provider on file. ZOX:[id number]  DOA: 01/05/2021  LOS: 24 days  Hospital Day: 25  Chief Complaint  Patient presents with   Altered Mental Status   Seizures    Brief narrative: Steve SaverJames Orvis is a 56 y.o. male with history of major depression, anxiety who was in a group home for last 5 months. He was brought to the ED on 12/20 from a group home for convalescence, combativeness and altered sensorium.  Per collateral history, patient was not acting typical for last few days. In the ED, patient was confused, combative Labs showed AKI with creatinine of 3.27, rhabdomyolysis, abnormal LFTs, elevated WBC count at 23.7 and lactic acid level at 7.6. 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  IV fluid resuscitation started Patient was admitted to ICU. 12/21, underwent LP which showed staph in CSF, felt to be contaminant.  EEG did not show any seizures 12/24, HD catheter was placed but patient did not require HD as creatinine started to improve 12/31, with clinical improvement, [facility]  His main problem over the last several days has been severe pain in the left lower extremity affecting his ambulation because of which he has not been able to be discharged.  Subjective: Patient was seen and examined this morning.  Lying down in bed.  He is very less conversational today than yesterday.  Examination is difficult because he is not following commands, seems to be looking for secondary gain. Psychiatry consult appreciated.  Assessment/Plan: Left lower extremity pain, weakness. -Patient had swelling, pain and tenderness and decreased range of motion of left ankle.  X-ray and MRI unremarkable.  Orthopedic consult obtained.  No  local structural pathology identified.  No new recommendation. -MRI lumbar spine was obtained. It shows small foraminal disc protrusion at multiple levels from T11-L5.  I discussed the case with neurosurgeon Dr. Lovell SheehanJenkins this morning.  He stated that the MRI is very close to normal and is not the primary cause of his left ankle issues. -PTA, patient was on Neurontin 400 mg 3 times daily.  It was held on admission because of significant AKI.  After improvement in renal function, Neurontin was resumed at a higher dose.  - -Swelling seems to have been completely resolved.  He is examination shows inconsistent findings when distracted.  I do not think he is being honest to us about his level of weakness. -Psychiatry consult appreciated.  He was very guarded and selectively mute.  There may be a likely element of secondary gain.  Seizure -Generalized convulsion was noted by his roommate prior to presentation. -EEG unremarkable.  Neurology consult appreciated.  Currently on Vimpat 100 mg twice daily given renal failure and possibility of Keppra contributing to agitation.  Acute renal failure secondary to rhabdomyolysis Acute metabolic acidosis -His creatinine continue to worsen to peak at 7.48 on 12/26.  Nephrology consult was obtained.  He was prepped for dialysis, had a HD catheter placed but never needed dialysis.  -Creatinine gradually improved to normal.  Serum bicarb level improved as well. -Nephrology consult appreciated  Rhabdomyolysis -Primary cause of AKI.  CK level improved with IV fluid.   Uremic encephalopathy -Was combative on admission. Mental status improved with improvement in uremia. -Patient states he has history of major  depression and anxiety.   -Currently he is on Prozac 40 mg daily, Seroquel 100 mg twice daily, BuSpar 10 mg twice daily, trazodone 50 mg nightly.    Ruled out upper extremity DVT -Per critical care note, hemodialysis cath had a clot on it when it was removed.   Ultrasound duplex scan of upper extremities was obtained on 12/31 which did not show any evidence of DVT.  Because of his risk of falls, noncompliance, recurrent seizures, I would avoid long-term anticoagulation without definite evidence of DVT.    Aspiration pneumonia -Completed a course of Zosyn  Acute liver injury -His AST and ALT were significantly elevated to over 700s on admission.  Gradually improved to normal. Recent Labs  Lab 01/28/21 0208  AST 22  ALT 22  ALKPHOS 52  BILITOT 0.7  PROT 8.1  ALBUMIN 4.0    Evidence of right ventricular dysfunction and recent echocardiogram -Echo 1/2 with EF 60 to 65%.  Cannot rule out a small PFO. -Not clinically significant as patient has no symptoms related to it. Continue to monitor as an outpatient.  Mobility: Encourage ambulation Living condition: Group home Goals of care:   Code Status: Full Code  Nutritional status: Body mass index is 20.11 kg/m.      Diet:  Diet Order             Diet regular Room service appropriate? Yes; Fluid consistency: Thin  Diet effective now           Diet general                  DVT prophylaxis:  heparin injection 5,000 Units Start: 01/16/21 1545   Antimicrobials: None Fluid: None Consultants: Nephrology Family Communication: None at bedside  Status is: Inpatient  Continue in-hospital care because: Patient continues to show signs of weakness.  Suspected malingering behavior.    Level of care: Med-Surg   Dispo: The patient is from: Sober home              Anticipated d/c is to: Unclear at this time              patient currently is medically stable to d/c.   Difficult to place patient No     Infusions:   sodium chloride Stopped (01/08/21 2354)    Scheduled Meds:  amLODipine  2.5 mg Oral Daily   aspirin  325 mg Oral Daily   Or   aspirin  300 mg Rectal Daily   busPIRone  10 mg Oral BID   docusate sodium  100 mg Oral BID   feeding supplement  237 mL Oral BID BM    FLUoxetine  40 mg Oral Daily   folic acid  1 mg Oral Daily   gabapentin  600 mg Oral TID   heparin injection (subcutaneous)  5,000 Units Subcutaneous Q8H   lacosamide  100 mg Oral BID   lidocaine  1 patch Transdermal Q24H   mouth rinse  15 mL Mouth Rinse BID   nicotine  21 mg Transdermal Daily   polyethylene glycol  17 g Oral Daily   QUEtiapine  100 mg Oral BID   sodium chloride flush  3 mL Intravenous Q12H   traZODone  50 mg Oral QHS    PRN meds: sodium chloride, albuterol, docusate sodium, ondansetron (ZOFRAN) IV, polyethylene glycol, sodium chloride flush   Antimicrobials: Anti-infectives (From admission, onward)    Start     Dose/Rate Route Frequency Ordered Stop   01/07/21 1500  vancomycin (VANCOREADY) IVPB 750 mg/150 mL  Status:  Discontinued        750 mg 150 mL/hr over 60 Minutes Intravenous Every 24 hours 01/06/21 1326 01/07/21 0802   01/07/21 0802  vancomycin variable dose per unstable renal function (pharmacist dosing)  Status:  Discontinued         Does not apply See admin instructions 01/07/21 0802 01/07/21 1000   01/06/21 2200  cefTRIAXone (ROCEPHIN) 2 g in sodium chloride 0.9 % 100 mL IVPB  Status:  Discontinued        2 g 200 mL/hr over 30 Minutes Intravenous Every 12 hours 01/06/21 1635 01/07/21 1000   01/06/21 1600  ceFEPIme (MAXIPIME) 2 g in sodium chloride 0.9 % 100 mL IVPB  Status:  Discontinued        2 g 200 mL/hr over 30 Minutes Intravenous Every 24 hours 01/05/21 1612 01/05/21 1721   01/06/21 1415  vancomycin (VANCOREADY) IVPB 1500 mg/300 mL        1,500 mg 150 mL/hr over 120 Minutes Intravenous  Once 01/06/21 1326 01/06/21 1931   01/05/21 1845  Ampicillin-Sulbactam (UNASYN) 3 g in sodium chloride 0.9 % 100 mL IVPB  Status:  Discontinued        3 g 200 mL/hr over 30 Minutes Intravenous Every 12 hours 01/05/21 1754 01/06/21 1635   01/05/21 1645  vancomycin (VANCOCIN) IVPB 1000 mg/200 mL premix        1,000 mg 200 mL/hr over 60 Minutes Intravenous  Once  01/05/21 1513 01/05/21 1752   01/05/21 1612  vancomycin variable dose per unstable renal function (pharmacist dosing)  Status:  Discontinued         Does not apply See admin instructions 01/05/21 1612 01/05/21 1721   01/05/21 1515  ceFEPIme (MAXIPIME) 2 g in sodium chloride 0.9 % 100 mL IVPB        2 g 200 mL/hr over 30 Minutes Intravenous  Once 01/05/21 1513 01/05/21 1648       Objective: Vitals:   01/29/21 1129 01/29/21 1535  BP: 116/79 121/86  Pulse: 77 89  Resp: 20 20  Temp: 98.4 F (36.9 C) 98.4 F (36.9 C)  SpO2: 100%    No intake or output data in the 24 hours ending 01/29/21 1543   Filed Weights   01/26/21 0500 01/27/21 0411 01/28/21 0339  Weight: 60.6 kg 60 kg 60 kg   Weight change:  Body mass index is 20.11 kg/m.   Physical Exam: General exam: Pleasant, middle-aged Caucasian male.  Not in physical distress Skin: No rashes, lesions or ulcers. HEENT: Atraumatic, normocephalic, no obvious bleeding Lungs: Clear to auscultation bilaterally CVS: Regular rate and rhythm, no murmur GI/Abd soft, nontender, nondistended, and bowel sound present  CNS: Alert, awake, oriented x3. Psychiatry: Mood appropriate Extremities: No pedal edema, no calf tenderness.  Swelling of left ankle completely resolved.  He does not have sensitivity in the left foot or ankle anymore.  Exam findings with inconsistent level of strength.    Data Review: I have personally reviewed the laboratory data and studies available.  F/u labs ordered [employer] (From admission, onward)     Start     Ordered   Signed and Held  Hepatitis B surface antigen  (New Admission [facility] (Hepatitis B))  Once,   R       Question:  Specimen collection method  Answer:  Lab=Lab collect   Signed and Held   Signed and Held  Hepatitis B surface antibody  (  New Admission [facility] (Hepatitis B))  Once,   R       Question:  Specimen collection method  Answer:  Lab=Lab collect   Signed and Held   Signed and Held   Hepatitis B surface antibody,quantitative  (New Admission [facility] (Hepatitis B))  Once,   R       Question:  Specimen collection method  Answer:  Lab=Lab collect   Signed and Held            Signed, Lorin Glass, MD [facility] 01/29/2021

## 2021-01-29 NOTE — Progress Notes (Signed)
Patient refused 2000 VS and 0400 VS. Patient agitated being woken up. Asked multiple times about VS and he expressed he "did not want to be bothered".

## 2021-01-30 NOTE — Progress Notes (Signed)
PROGRESS NOTE  Steve SaverJames [name]  DOB: 11-04-1965  PCP: No primary care provider on file. WUJ:[id number]  DOA: 01/05/2021  LOS: 25 days  Hospital Day: 26  Chief Complaint  Patient presents with   Altered Mental Status   Seizures    Brief narrative: Steve [name] is a 56 y.o. male with history of major depression, anxiety who was in a group home for last 5 months. He was brought to the ED on 12/20 from a group home for convalescence, combativeness and altered sensorium.  Per collateral history, patient was not acting typical for last few days. In the ED, patient was confused, combative Labs showed AKI with creatinine of 3.27, rhabdomyolysis, abnormal LFTs, elevated WBC count at 23.7 and lactic acid level at 7.6. 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  IV fluid resuscitation started Patient was admitted to ICU. 12/21, underwent LP which showed staph in CSF, felt to be contaminant.  EEG did not show any seizures 12/24, HD catheter was placed but patient did not require HD as creatinine started to improve 12/31, with clinical improvement, [facility]  His main problem over the last several days has been severe pain in the left lower extremity affecting his ambulation because of which he has not been able to be discharged.  Subjective: Patient was seen and examined this morning.  Lying on bed.  Slow to respond but alert, awake, oriented to place and person.  Able to hold motor commands. Lying down in bed.  He is very less conversational today than yesterday.  Examination is difficult because he is not following commands, seems to be looking for secondary gain. Psychiatry consult appreciated.  Assessment/Plan: Left lower extremity pain, weakness. -Patient had swelling, pain and  tenderness and decreased range of motion of left ankle.  X-ray and MRI unremarkable.  Orthopedic consult obtained.  No local structural pathology identified.  No new recommendation. -MRI lumbar spine was obtained. It shows small foraminal disc protrusion at multiple levels from T11-L5.  I discussed the case with neurosurgeon Dr. Lovell SheehanJenkins this morning.  He stated that the MRI is very close to normal and is not the primary cause of his left ankle issues. -PTA, patient was on Neurontin 400 mg 3 times daily.  It was held on admission because of significant AKI.  After improvement in renal function, Neurontin was resumed at a higher dose.  -Swelling seems to have been completely resolved.  He is examination shows inconsistent findings when distracted.  I do not think he is being honest to us about his level of weakness. -Psychiatry consult appreciated.  He was very guarded and selectively mute.  There may be a likely element of secondary gain.  Seizure -Generalized convulsion was noted by his roommate prior to presentation. -EEG unremarkable.  Neurology consult appreciated.  Currently on Vimpat 100 mg twice daily given renal failure and possibility of Keppra contributing to agitation.  Acute renal failure secondary to rhabdomyolysis Acute metabolic acidosis -His creatinine continue to worsen to peak at 7.48 on 12/26.  Nephrology consult was obtained.  He was prepped for dialysis, had a HD catheter placed but never needed dialysis.  -Creatinine gradually improved to normal.  Serum bicarb level improved as well. -Nephrology consult appreciated  Rhabdomyolysis -Primary cause of AKI.  CK level improved with IV fluid.  Uremic encephalopathy -Was combative on admission. Mental status improved with improvement in uremia. -Patient states he has history of major depression and anxiety.   -Currently he is on Prozac 40 mg daily, Seroquel 100 mg twice daily, BuSpar 10 mg twice daily, trazodone 50 mg nightly.     Ruled out upper extremity DVT -Per critical care note, hemodialysis cath had a clot on it when it was removed.  Ultrasound duplex scan of upper extremities was obtained on 12/31 which did not show any evidence of DVT.  Because of his risk of falls, noncompliance, recurrent seizures, I would avoid long-term anticoagulation without definite evidence of DVT.    Aspiration pneumonia -Completed a course of Zosyn  Acute liver injury -His AST and ALT were significantly elevated to over 700s on admission.  Gradually improved to normal. Recent Labs  Lab 01/28/21 0208  AST 22  ALT 22  ALKPHOS 52  BILITOT 0.7  PROT 8.1  ALBUMIN 4.0   Evidence of right ventricular dysfunction and recent echocardiogram -Echo 1/2 with EF 60 to 65%.  Cannot rule out a small PFO. -Not clinically significant as patient has no symptoms related to it. Continue to monitor as an outpatient.  Mobility: Encourage ambulation Living condition: Group home Goals of care:   Code Status: Full Code  Nutritional status: Body mass index is 19.88 kg/m.      Diet:  Diet Order             Diet regular Room service appropriate? Yes; Fluid consistency: Thin  Diet effective now           Diet general                  DVT prophylaxis:  heparin injection 5,000 Units Start: 01/16/21 1545   Antimicrobials: None Fluid: None Consultants: Nephrology Family Communication: None at bedside  Status is: Inpatient  Continue in-hospital care because: Patient continues to show signs of weakness.  Suspected malingering behavior.    Level of care: Med-Surg   Dispo: The patient is from: Sober home              Anticipated d/c is to: Unclear at this time              patient currently is medically stable to d/c.   Difficult to place patient No     Infusions:   sodium chloride Stopped (01/08/21 2354)    Scheduled Meds:  amLODipine  2.5 mg Oral Daily   aspirin  325 mg Oral Daily   Or   aspirin  300 mg Rectal Daily    busPIRone  10 mg Oral BID   docusate sodium  100 mg Oral BID   feeding supplement  237 mL Oral BID BM   FLUoxetine  40 mg Oral Daily   folic acid  1 mg Oral Daily   gabapentin  600 mg Oral TID   heparin injection (subcutaneous)  5,000 Units Subcutaneous Q8H   lacosamide  100 mg Oral BID   lidocaine  1 patch Transdermal Q24H   mouth rinse  15 mL Mouth Rinse BID   nicotine  21 mg Transdermal Daily   polyethylene glycol  17 g Oral Daily   QUEtiapine  100 mg Oral QHS   QUEtiapine  50 mg Oral Daily   sodium chloride flush  3 mL Intravenous Q12H   traZODone  50 mg Oral QHS    PRN meds: sodium chloride, albuterol, docusate sodium, ondansetron (ZOFRAN) IV, polyethylene glycol,  sodium chloride flush   Antimicrobials: Anti-infectives (From admission, onward)    Start     Dose/Rate Route Frequency Ordered Stop   01/07/21 1500  vancomycin (VANCOREADY) IVPB 750 mg/150 mL  Status:  Discontinued        750 mg 150 mL/hr over 60 Minutes Intravenous Every 24 hours 01/06/21 1326 01/07/21 0802   01/07/21 0802  vancomycin variable dose per unstable renal function (pharmacist dosing)  Status:  Discontinued         Does not apply See admin instructions 01/07/21 0802 01/07/21 1000   01/06/21 2200  cefTRIAXone (ROCEPHIN) 2 g in sodium chloride 0.9 % 100 mL IVPB  Status:  Discontinued        2 g 200 mL/hr over 30 Minutes Intravenous Every 12 hours 01/06/21 1635 01/07/21 1000   01/06/21 1600  ceFEPIme (MAXIPIME) 2 g in sodium chloride 0.9 % 100 mL IVPB  Status:  Discontinued        2 g 200 mL/hr over 30 Minutes Intravenous Every 24 hours 01/05/21 1612 01/05/21 1721   01/06/21 1415  vancomycin (VANCOREADY) IVPB 1500 mg/300 mL        1,500 mg 150 mL/hr over 120 Minutes Intravenous  Once 01/06/21 1326 01/06/21 1931   01/05/21 1845  Ampicillin-Sulbactam (UNASYN) 3 g in sodium chloride 0.9 % 100 mL IVPB  Status:  Discontinued        3 g 200 mL/hr over 30 Minutes Intravenous Every 12 hours 01/05/21 1754  01/06/21 1635   01/05/21 1645  vancomycin (VANCOCIN) IVPB 1000 mg/200 mL premix        1,000 mg 200 mL/hr over 60 Minutes Intravenous  Once 01/05/21 1513 01/05/21 1752   01/05/21 1612  vancomycin variable dose per unstable renal function (pharmacist dosing)  Status:  Discontinued         Does not apply See admin instructions 01/05/21 1612 01/05/21 1721   01/05/21 1515  ceFEPIme (MAXIPIME) 2 g in sodium chloride 0.9 % 100 mL IVPB        2 g 200 mL/hr over 30 Minutes Intravenous  Once 01/05/21 1513 01/05/21 1648       Objective: Vitals:   01/30/21 0318 01/30/21 0758  BP: 121/88 (!) 142/112  Pulse: 83 79  Resp: 16 16  Temp: 97.6 F (36.4 C) 97.8 F (36.6 C)  SpO2: 100% 100%    Intake/Output Summary (Last 24 hours) at 01/30/2021 1420 Last data filed at 01/29/2021 1700 Gross per 24 hour  Intake 240 ml  Output --  Net 240 ml    Filed Weights   01/27/21 0411 01/28/21 0339 01/30/21 0605  Weight: 60 kg 60 kg 59.3 kg   Weight change:  Body mass index is 19.88 kg/m.   Physical Exam: General exam: Pleasant, middle-aged Caucasian male.  Not in physical distress Skin: No rashes, lesions or ulcers. HEENT: Atraumatic, normocephalic, no obvious bleeding Lungs: Clear to auscultation bilaterally CVS: Regular rate and rhythm, no murmur GI/Abd soft, nontender, nondistended, and bowel sound present  CNS: Alert, awake, oriented x3. Psychiatry: Mood appropriate Extremities: No pedal edema, no calf tenderness.  Swelling of left ankle completely resolved.  He does not have sensitivity in the left foot or ankle anymore.  Exam findings with inconsistent level of strength.    Data Review: I have personally reviewed the laboratory data and studies available.  F/u labs ordered [employer] (From admission, onward)     Start     Ordered   Signed and Held  Hepatitis B surface antigen  (New Admission Hemo Labs (Hepatitis B))  Once,   R       Question:  Specimen collection method  Answer:   Lab=Lab collect   Signed and Held   Signed and Held  Hepatitis B surface antibody  (New Admission Hemo Labs (Hepatitis B))  Once,   R       Question:  Specimen collection method  Answer:  Lab=Lab collect   Signed and Held   Signed and Held  Hepatitis B surface antibody,quantitative  (New Admission Hemo Labs (Hepatitis B))  Once,   R       Question:  Specimen collection method  Answer:  Lab=Lab collect   Signed and Held            Signed, Lorin Glass, MD [facility] 01/30/2021

## 2021-01-31 MED ORDER — ACETAMINOPHEN 325 MG PO TABS
650.0000 mg | ORAL_TABLET | ORAL | Status: DC | PRN
  Administered 2021-01-31 – 2021-04-27 (×45): 650 mg via ORAL
  Filled 2021-01-31 (×46): qty 2

## 2021-01-31 NOTE — Progress Notes (Signed)
PROGRESS NOTE  Steve SaverJames [name]  DOB: September 24, 1965  PCP: No primary care provider on file. [id number]  DOA: 01/05/2021  LOS: 26 days  Hospital Day: 27  Chief Complaint  Patient presents with   Altered Mental Status   Seizures    Brief narrative: Steve [name] is a 56 y.o. male with history of major depression, anxiety who was in a group home for last 5 months. He was brought to the ED on 12/20 from a group home for convalescence, combativeness and altered sensorium.  Per collateral history, patient was not acting typical for last few days. In the ED, patient was confused, combative Labs showed AKI with creatinine of 3.27, rhabdomyolysis, abnormal LFTs, elevated WBC count at 23.7 and lactic acid level at 7.6. 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  IV fluid resuscitation started Patient was admitted to ICU. 12/21, underwent LP which showed staph in CSF, felt to be contaminant.  EEG did not show any seizures 12/24, HD catheter was placed but patient did not require HD as creatinine started to improve 12/31, with clinical improvement, [facility]  His main problem over the last several days has been severe pain in the left lower extremity affecting his ambulation because of which he has not been able to be discharged.  Subjective: Patient was seen and examined this morning.  Alert, awake, lying in bed.  Answers questions appropriately but suddenly stops speaking.  Does not follow command.  There is a mixed degree of mental health issues as well as malingering.  Assessment/Plan: Left lower extremity pain, weakness. -Patient had swelling, pain and tenderness and decreased range of motion of left ankle.  X-ray and MRI unremarkable.  Orthopedic consult obtained.  No local structural  pathology identified.  No new recommendation. -MRI lumbar spine was obtained. It shows small foraminal disc protrusion at multiple levels from T11-L5.  I discussed the case with neurosurgeon Dr. Lovell SheehanJenkins this morning.  He stated that the MRI is very close to normal and is not the primary cause of his left ankle issues. -PTA, patient was on Neurontin 400 mg 3 times daily.  It was held on admission because of significant AKI.  After improvement in renal function, Neurontin was resumed at a higher dose.  -Swelling seems to have been completely resolved.  He is examination shows inconsistent findings when distracted.  I do not think he is being honest to us about his level of weakness. -Psychiatry consult appreciated.  He was very guarded and selectively mute.  There may be a likely element of secondary gain.  Seizure -Generalized convulsion was noted by his roommate prior to presentation. -EEG unremarkable.  Neurology consult appreciated.  Currently on Vimpat 100 mg twice daily given renal failure and possibility of Keppra contributing to agitation.  Acute renal failure secondary to rhabdomyolysis Acute metabolic acidosis -His creatinine continue to worsen to peak at 7.48 on 12/26.  Nephrology consult was obtained.  He was prepped for dialysis, had a HD catheter placed but never needed dialysis.  -Creatinine gradually improved to normal.  Serum bicarb level improved as well. -Nephrology consult appreciated  Rhabdomyolysis -Primary cause of AKI.  CK level improved with IV fluid.   Uremic encephalopathy -Was combative on admission. Mental status improved with improvement in uremia. -Patient states he has history of major depression and  anxiety.   -Currently he is on Prozac 40 mg daily, Seroquel 100 mg twice daily, BuSpar 10 mg twice daily, trazodone 50 mg nightly.    Ruled out upper extremity DVT -Per critical care note, hemodialysis cath had a clot on it when it was removed.  Ultrasound duplex  scan of upper extremities was obtained on 12/31 which did not show any evidence of DVT.  Because of his risk of falls, noncompliance, recurrent seizures, I would avoid long-term anticoagulation without definite evidence of DVT.    Aspiration pneumonia -Completed a course of Zosyn  Acute liver injury -His AST and ALT were significantly elevated to over 700s on admission.  Gradually improved to normal. Recent Labs  Lab 01/28/21 0208  AST 22  ALT 22  ALKPHOS 52  BILITOT 0.7  PROT 8.1  ALBUMIN 4.0   Evidence of right ventricular dysfunction and recent echocardiogram -Echo 1/2 with EF 60 to 65%.  Cannot rule out a small PFO. -Not clinically significant as patient has no symptoms related to it. Continue to monitor as an outpatient.  Mobility: Encourage ambulation Living condition: Group home Goals of care:   Code Status: Full Code  Nutritional status: Body mass index is 19.88 kg/m.      Diet:  Diet Order             Diet regular Room service appropriate? Yes; Fluid consistency: Thin  Diet effective now           Diet general                  DVT prophylaxis:  heparin injection 5,000 Units Start: 01/16/21 1545   Antimicrobials: None Fluid: None Consultants: Nephrology Family Communication: None at bedside  Status is: Inpatient  Continue in-hospital care because: Patient continues to show signs of weakness.  Suspected malingering behavior.    Level of care: Med-Surg   Dispo: The patient is from: Sober home              Anticipated d/c is to: Unclear at this time.  Difficult to place              patient currently is medically stable to d/c.   Difficult to place patient yes     Infusions:   sodium chloride Stopped (01/08/21 2354)    Scheduled Meds:  amLODipine  2.5 mg Oral Daily   aspirin  325 mg Oral Daily   Or   aspirin  300 mg Rectal Daily   busPIRone  10 mg Oral BID   docusate sodium  100 mg Oral BID   feeding supplement  237 mL Oral BID BM    FLUoxetine  40 mg Oral Daily   folic acid  1 mg Oral Daily   gabapentin  600 mg Oral TID   heparin injection (subcutaneous)  5,000 Units Subcutaneous Q8H   lacosamide  100 mg Oral BID   lidocaine  1 patch Transdermal Q24H   mouth rinse  15 mL Mouth Rinse BID   nicotine  21 mg Transdermal Daily   polyethylene glycol  17 g Oral Daily   QUEtiapine  100 mg Oral QHS   QUEtiapine  50 mg Oral Daily   sodium chloride flush  3 mL Intravenous Q12H   traZODone  50 mg Oral QHS    PRN meds: sodium chloride, acetaminophen, albuterol, docusate sodium, ondansetron (ZOFRAN) IV, polyethylene glycol, sodium chloride flush   Antimicrobials: Anti-infectives (From admission, onward)    Start  Dose/Rate Route Frequency Ordered Stop   01/07/21 1500  vancomycin (VANCOREADY) IVPB 750 mg/150 mL  Status:  Discontinued        750 mg 150 mL/hr over 60 Minutes Intravenous Every 24 hours 01/06/21 1326 01/07/21 0802   01/07/21 0802  vancomycin variable dose per unstable renal function (pharmacist dosing)  Status:  Discontinued         Does not apply See admin instructions 01/07/21 0802 01/07/21 1000   01/06/21 2200  cefTRIAXone (ROCEPHIN) 2 g in sodium chloride 0.9 % 100 mL IVPB  Status:  Discontinued        2 g 200 mL/hr over 30 Minutes Intravenous Every 12 hours 01/06/21 1635 01/07/21 1000   01/06/21 1600  ceFEPIme (MAXIPIME) 2 g in sodium chloride 0.9 % 100 mL IVPB  Status:  Discontinued        2 g 200 mL/hr over 30 Minutes Intravenous Every 24 hours 01/05/21 1612 01/05/21 1721   01/06/21 1415  vancomycin (VANCOREADY) IVPB 1500 mg/300 mL        1,500 mg 150 mL/hr over 120 Minutes Intravenous  Once 01/06/21 1326 01/06/21 1931   01/05/21 1845  Ampicillin-Sulbactam (UNASYN) 3 g in sodium chloride 0.9 % 100 mL IVPB  Status:  Discontinued        3 g 200 mL/hr over 30 Minutes Intravenous Every 12 hours 01/05/21 1754 01/06/21 1635   01/05/21 1645  vancomycin (VANCOCIN) IVPB 1000 mg/200 mL premix        1,000  mg 200 mL/hr over 60 Minutes Intravenous  Once 01/05/21 1513 01/05/21 1752   01/05/21 1612  vancomycin variable dose per unstable renal function (pharmacist dosing)  Status:  Discontinued         Does not apply See admin instructions 01/05/21 1612 01/05/21 1721   01/05/21 1515  ceFEPIme (MAXIPIME) 2 g in sodium chloride 0.9 % 100 mL IVPB        2 g 200 mL/hr over 30 Minutes Intravenous  Once 01/05/21 1513 01/05/21 1648       Objective: Vitals:   01/31/21 0814 01/31/21 1400  BP: 121/87 (!) 126/95  Pulse: 77 78  Resp: 16   Temp: 97.8 F (36.6 C) 97.6 F (36.4 C)  SpO2: 100% 100%    Intake/Output Summary (Last 24 hours) at 01/31/2021 1618 Last data filed at 01/31/2021 0030 Gross per 24 hour  Intake 120 ml  Output --  Net 120 ml    Filed Weights   01/27/21 0411 01/28/21 0339 01/30/21 0605  Weight: 60 kg 60 kg 59.3 kg   Weight change:  Body mass index is 19.88 kg/m.   Physical Exam: General exam: Pleasant, middle-aged Caucasian male.  Not in physical distress Skin: No rashes, lesions or ulcers. HEENT: Atraumatic, normocephalic, no obvious bleeding Lungs: Clear to auscultation bilaterally CVS: Regular rate and rhythm, no murmur GI/Abd soft, nontender, nondistended, and bowel sound present  CNS: Alert, awake, oriented x3. Psychiatry: Mood appropriate Extremities: No pedal edema, no calf tenderness.  Swelling and pain of left ankle completely resolved.   Exam findings with inconsistent level of strength.    Data Review: I have personally reviewed the laboratory data and studies available.  F/u labs ordered [employer] (From admission, onward)     Start     Ordered   Signed and Held  Hepatitis B surface antigen  (New Admission Hemo Labs (Hepatitis B))  Once,   R       Question:  Specimen collection method  Answer:  Lab=Lab collect   Signed and Held   Signed and Held  Hepatitis B surface antibody  (New Admission Hemo Labs (Hepatitis B))  Once,   R       Question:   Specimen collection method  Answer:  Lab=Lab collect   Signed and Held   Signed and Held  Hepatitis B surface antibody,quantitative  (New Admission Hemo Labs (Hepatitis B))  Once,   R       Question:  Specimen collection method  Answer:  Lab=Lab collect   Signed and Held            Signed, Lorin GlassBinaya Jamason Peckham, MD [facility] 01/31/2021

## 2021-01-31 NOTE — Plan of Care (Signed)
Pt is alert x 1. Stand by assist. Denies pain.   Problem: Education: Goal: Knowledge of General Education information will improve Description: Including pain rating scale, medication(s)/side effects and non-pharmacologic comfort measures Outcome: Progressing   Problem: Health Behavior/Discharge Planning: Goal: Ability to manage health-related needs will improve Outcome: Progressing   Problem: Clinical Measurements: Goal: Ability to maintain clinical measurements within normal limits will improve Outcome: Progressing Goal: Will remain free from infection Outcome: Progressing Goal: Diagnostic test results will improve Outcome: Progressing Goal: Respiratory complications will improve Outcome: Progressing Goal: Cardiovascular complication will be avoided Outcome: Progressing   Problem: Activity: Goal: Risk for activity intolerance will decrease Outcome: Progressing   Problem: Nutrition: Goal: Adequate nutrition will be maintained Outcome: Progressing   Problem: Coping: Goal: Level of anxiety will decrease Outcome: Progressing   Problem: Elimination: Goal: Will not experience complications related to bowel motility Outcome: Progressing Goal: Will not experience complications related to urinary retention Outcome: Progressing   Problem: Pain Managment: Goal: General experience of comfort will improve Outcome: Progressing   Problem: Safety: Goal: Ability to remain free from injury will improve Outcome: Progressing   Problem: Skin Integrity: Goal: Risk for impaired skin integrity will decrease Outcome: Progressing   Problem: Education: Goal: Expressions of having a comfortable level of knowledge regarding the disease process will increase Outcome: Progressing   Problem: Coping: Goal: Ability to adjust to condition or change in health will improve Outcome: Progressing Goal: Ability to identify appropriate support needs will improve Outcome: Progressing   Problem:  Health Behavior/Discharge Planning: Goal: Compliance with prescribed medication regimen will improve Outcome: Progressing   Problem: Medication: Goal: Risk for medication side effects will decrease Outcome: Progressing   Problem: Clinical Measurements: Goal: Complications related to the disease process, condition or treatment will be avoided or minimized Outcome: Progressing Goal: Diagnostic test results will improve Outcome: Progressing   Problem: Safety: Goal: Verbalization of understanding the information provided will improve Outcome: Progressing   Problem: Self-Concept: Goal: Level of anxiety will decrease Outcome: Progressing Goal: Ability to verbalize feelings about condition will improve Outcome: Progressing

## 2021-02-01 NOTE — Consult Note (Signed)
[facility] Face-to-Face Psychiatry Consult Follow up  Steve [name] is a 56 y.o. male patient with past psychiatric history significant for MDD, anxiety who initially presented to ED on 12/20 for full body seizure, AMS and combativeness.  Per collateral patient was not acting typical.  Patient was found to have rhabdomyolysis leading to uremia and encephalopathy, and abnormal LFTs.  Psych consult MDD affecting care ?Malingering behavior.  Patient was seen by psych service on 02/01/2021.  During initial evaluation, patient was very guarded and selectively mute. He only spoke when he wanted to give some information.  He denied SI, HI, AVH.  Patient did not meet criteria for inpatient psych admission.  Collateral from patient's son Steve [name] @704 - - Son thinks that his dad may be depressed.  He has talked to him couple times since Pt has been admitted to the hospital.  Son states patient was living at sober living before he was admitted to hospital.  He is not sure what patient was abusing but thinks that it might be cocaine or alcohol.  He states that he last saw him physically 6 to 7 months ago.  He denies any past suicidal attempts or psychiatry inpatient hospitalizations.  He states that he is not able to let him live at his house as he has 2 kids and his partner.  Plan-  -Patient does not meet criteria for inpatient psychiatric admission. -There  may be likely an element of secondary gain. -Continue current psych medications. -Disposition- per primary -Psych service will sign off.  Dr. [phone] MD  [facility] Psychiatry.

## 2021-02-01 NOTE — Progress Notes (Signed)
PROGRESS NOTE  Steve [name]  DOB: 06/07/1965  PCP: No primary care provider on file. KXF:[id number]  DOA: 01/05/2021  LOS: 27 days  Hospital Day: 28  Chief Complaint  Patient presents with   Altered Mental Status   Seizures    Brief narrative: Steve [name] is a 56 y.o. male with history of major depression, anxiety who was in a group home for last 5 months. He was brought to the ED on 12/20 from a group home for convalescence, combativeness and altered sensorium.  Per collateral history, patient was not acting typical for last few days. In the ED, patient was confused, combative Labs showed AKI with creatinine of 3.27, rhabdomyolysis, abnormal LFTs, elevated WBC count at 23.7 and lactic acid level at 7.6. 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  IV fluid resuscitation started Patient was admitted to ICU. 12/21, underwent LP which showed staph in CSF, felt to be contaminant.  EEG did not show any seizures 12/24, HD catheter was placed but patient did not require HD as creatinine started to improve 12/31, with clinical improvement, patient was transferred out of ICU to [facility].  His main problem over the last several days has been severe pain in the left lower extremity affecting his ambulation because of which he has not been able to be discharged.  Subjective: Patient was seen and examined this morning.  Alert, awake, lying on bed.  Answers some questions but not others.  Very inconsistent.  Has no pain or tenderness or limitation in mobility of his lower extremities whatsoever.   Still says that he cannot walk independently.  Assessment/Plan: Left lower extremity pain, weakness. -Patient had swelling, pain and tenderness and decreased range of motion of left ankle.  X-ray and MRI unremarkable.  Orthopedic  consult obtained.  No local structural pathology identified.  No new recommendation. -MRI lumbar spine was obtained. It shows small foraminal disc protrusion at multiple levels from T11-L5.  I discussed the case with neurosurgeon Dr. Lovell Sheehan this morning.  He stated that the MRI is very close to normal and is not the primary cause of his left ankle issues. -PTA, patient was on Neurontin 400 mg 3 times daily.  It was held on admission because of significant AKI.  After improvement in renal function, Neurontin was resumed at a higher dose.  -Swelling seems to have been completely resolved.  He is examination shows inconsistent findings when distracted.  I do not think he is being honest to [place] about his level of weakness. -Psychiatry consult appreciated.  He was very guarded and selectively mute.  There may be a likely element of secondary gain.  Seizure -Generalized convulsion was noted by his roommate prior to presentation. -EEG unremarkable.  Neurology consult appreciated.  Currently on Vimpat 100 mg twice daily given renal failure and possibility of Keppra contributing to agitation.  Acute renal failure secondary to rhabdomyolysis Acute metabolic acidosis -His creatinine continue to worsen to peak at 7.48 on 12/26.  Nephrology consult was obtained.  He was prepped for dialysis, had a HD catheter placed but never needed dialysis.  -Creatinine gradually improved to normal.  Serum bicarb level improved as well. -Nephrology consult appreciated  Rhabdomyolysis -Primary cause of AKI.  CK level improved with IV fluid.   Uremic encephalopathy -Was combative on admission. Mental status improved with improvement in uremia. -Patient states  he has history of major depression and anxiety.   -Currently he is on Prozac 40 mg daily, Seroquel 100 mg twice daily, BuSpar 10 mg twice daily, trazodone 50 mg nightly.    Ruled out upper extremity DVT -Per critical care note, hemodialysis cath had a clot on it when  it was removed.  Ultrasound duplex scan of upper extremities was obtained on 12/31 which did not show any evidence of DVT.  Because of his risk of falls, noncompliance, recurrent seizures, I would avoid long-term anticoagulation without definite evidence of DVT.    Aspiration pneumonia -Completed a course of Zosyn  Acute liver injury -His AST and ALT were significantly elevated to over 700s on admission.  Gradually improved to normal. Recent Labs  Lab 01/28/21 0208  AST 22  ALT 22  ALKPHOS 52  BILITOT 0.7  PROT 8.1  ALBUMIN 4.0   Evidence of right ventricular dysfunction and recent echocardiogram -Echo 1/2 with EF 60 to 65%.  Cannot rule out a small PFO. -Not clinically significant as patient has no symptoms related to it. Continue to monitor as an outpatient.  Mobility: Encourage ambulation Living condition: Group home Goals of care:   Code Status: Full Code  Nutritional status: Body mass index is 19.88 kg/m.      Diet:  Diet Order             Diet regular Room service appropriate? Yes; Fluid consistency: Thin  Diet effective now           Diet general                  DVT prophylaxis:  heparin injection 5,000 Units Start: 01/16/21 1545   Antimicrobials: None Fluid: None Consultants: Nephrology Family Communication: None at bedside  Status is: Inpatient  Continue in-hospital care because: Patient continues to have malingering behavior.  No safe discharge plan.   Level of care: Med-Surg   Dispo: The patient is from: Sober home              Anticipated d/c is to: Unclear at this time.  Difficult to place              patient currently is medically stable to d/c.   Difficult to place patient yes     Infusions:   sodium chloride Stopped (01/08/21 2354)    Scheduled Meds:  amLODipine  2.5 mg Oral Daily   aspirin  325 mg Oral Daily   Or   aspirin  300 mg Rectal Daily   busPIRone  10 mg Oral BID   docusate sodium  100 mg Oral BID   feeding  supplement  237 mL Oral BID BM   FLUoxetine  40 mg Oral Daily   folic acid  1 mg Oral Daily   gabapentin  600 mg Oral TID   heparin injection (subcutaneous)  5,000 Units Subcutaneous Q8H   lacosamide  100 mg Oral BID   lidocaine  1 patch Transdermal Q24H   mouth rinse  15 mL Mouth Rinse BID   nicotine  21 mg Transdermal Daily   polyethylene glycol  17 g Oral Daily   QUEtiapine  100 mg Oral QHS   QUEtiapine  50 mg Oral Daily   sodium chloride flush  3 mL Intravenous Q12H   traZODone  50 mg Oral QHS    PRN meds: sodium chloride, acetaminophen, albuterol, docusate sodium, ondansetron (ZOFRAN) IV, polyethylene glycol, sodium chloride flush   Antimicrobials: Anti-infectives (From admission, onward)  Start     Dose/Rate Route Frequency Ordered Stop   01/07/21 1500  vancomycin (VANCOREADY) IVPB 750 mg/150 mL  Status:  Discontinued        750 mg 150 mL/hr over 60 Minutes Intravenous Every 24 hours 01/06/21 1326 01/07/21 0802   01/07/21 0802  vancomycin variable dose per unstable renal function (pharmacist dosing)  Status:  Discontinued         Does not apply See admin instructions 01/07/21 0802 01/07/21 1000   01/06/21 2200  cefTRIAXone (ROCEPHIN) 2 g in sodium chloride 0.9 % 100 mL IVPB  Status:  Discontinued        2 g 200 mL/hr over 30 Minutes Intravenous Every 12 hours 01/06/21 1635 01/07/21 1000   01/06/21 1600  ceFEPIme (MAXIPIME) 2 g in sodium chloride 0.9 % 100 mL IVPB  Status:  Discontinued        2 g 200 mL/hr over 30 Minutes Intravenous Every 24 hours 01/05/21 1612 01/05/21 1721   01/06/21 1415  vancomycin (VANCOREADY) IVPB 1500 mg/300 mL        1,500 mg 150 mL/hr over 120 Minutes Intravenous  Once 01/06/21 1326 01/06/21 1931   01/05/21 1845  Ampicillin-Sulbactam (UNASYN) 3 g in sodium chloride 0.9 % 100 mL IVPB  Status:  Discontinued        3 g 200 mL/hr over 30 Minutes Intravenous Every 12 hours 01/05/21 1754 01/06/21 1635   01/05/21 1645  vancomycin (VANCOCIN) IVPB  1000 mg/200 mL premix        1,000 mg 200 mL/hr over 60 Minutes Intravenous  Once 01/05/21 1513 01/05/21 1752   01/05/21 1612  vancomycin variable dose per unstable renal function (pharmacist dosing)  Status:  Discontinued         Does not apply See admin instructions 01/05/21 1612 01/05/21 1721   01/05/21 1515  ceFEPIme (MAXIPIME) 2 g in sodium chloride 0.9 % 100 mL IVPB        2 g 200 mL/hr over 30 Minutes Intravenous  Once 01/05/21 1513 01/05/21 1648       Objective: Vitals:   02/01/21 0420 02/01/21 0820  BP: 118/86 127/86  Pulse: 84 83  Resp: 17 18  Temp: (!) 97.5 F (36.4 C) 97.8 F (36.6 C)  SpO2: 100% 100%   No intake or output data in the 24 hours ending 02/01/21 1110   Filed Weights   01/27/21 0411 01/28/21 0339 01/30/21 0605  Weight: 60 kg 60 kg 59.3 kg   Weight change:  Body mass index is 19.88 kg/m.   Physical Exam: General exam: Pleasant, middle-aged Caucasian male.  Not in physical distress Skin: No rashes, lesions or ulcers. HEENT: Atraumatic, normocephalic, no obvious bleeding Lungs: Clear to auscultation bilaterally CVS: Regular rate and rhythm, no murmur GI/Abd soft, nontender, nondistended, and bowel sound present  CNS: Alert, awake, oriented x3. Psychiatry: Mood appropriate Extremities: No pedal edema, no calf tenderness.  Swelling and pain of left ankle completely resolved.   Exam findings with inconsistent level of strength.    Data Review: I have personally reviewed the laboratory data and studies available.  F/u labs ordered [employer] [employer] (From admission, onward)     Start     Ordered   Signed and Held  Hepatitis B surface antigen  (New Admission Hemo Labs (Hepatitis B))  Once,   R       Question:  Specimen collection method  Answer:  Lab=Lab collect   Signed and Held   Signed and  Held  Hepatitis B surface antibody  (New Admission Hemo Labs (Hepatitis B))  Once,   R       Question:  Specimen collection method  Answer:  Lab=Lab collect    Signed and Held   Signed and Held  Hepatitis B surface antibody,quantitative  (New Admission Hemo Labs (Hepatitis B))  Once,   R       Question:  Specimen collection method  Answer:  Lab=Lab collect   Signed and Held            Signed, Lorin Glass, MD [facility] 02/01/2021

## 2021-02-01 NOTE — Progress Notes (Signed)
Occupational Therapy Treatment Patient Details Name: Steve [name] MRN: [id number] DOB: Mar 10, 1965 Today's Date: 02/01/2021   History of present illness Pt is a 56 y.o. male admitted from group home (substance abuse rehab?) on 01/05/21 with witnessed seizure, AMS. 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 EEG unremarkable. LP on 12/21 showed staph in CSF, felt to be contaminant. Workup for AKI, rhabdomyolysis. Course complicated by c/o L foot pain; imaging negative for acute injury. No PMH in chart.   OT comments  Ashad is not progressing towards his acute OT goals, see care plan for updated OT goals. Reynol was participatory this date with flat affect and continues to require mod-max A for cognition. Toby tolerated donning his bilat socks at bed level, and was able to fully weight bear on his LLE with some mild grimacing. He demonstrated paranoid-like behaviors throughout, was highly distractible throughout and had nonsensical answers to simple questions/commands. Overall he continues to require mod-max A for OOB ADLs and transfers due to "freezing" and inconsistently following commands. D/c to SNF continues to be appropriate, pending psych evaluation. OT to continue to follow.   Recommendations for follow up therapy are one component of a multi-disciplinary discharge planning process, led by the attending physician.  Recommendations may be updated based on patient status, additional functional criteria and insurance authorization.    Follow Up Recommendations  Other (comment) (psych?)    Assistance Recommended at Discharge Frequent or constant Supervision/Assistance  Patient can return home with the following  A little help with walking and/or transfers;A little help with bathing/dressing/bathroom;Assistance with cooking/housework;Assist for transportation;Help with stairs or  ramp for entrance   Equipment Recommendations  None recommended by OT    Recommendations for Other Services      Precautions / Restrictions Precautions Precautions: Fall Precaution Comments: h/o L foot pain (moving LLE functionally at bed-level without issue) Restrictions LLE Weight Bearing: Weight bearing as tolerated Other Position/Activity Restrictions: CAM boot not needed per ortho note       Mobility Bed Mobility Overal bed mobility: Needs Assistance Bed Mobility: Supine to Sit     Supine to sit: Mod assist     General bed mobility comments: mod A to advance BLE towards EOB to initiate task. pt stating "yes" when asked to come to sitting EOB, but then only boosting himself up in the bed. Once BLE were advnaces off of the bed, pt sat up    Transfers Overall transfer level: Needs assistance Equipment used: 1 person hand held assist Transfers: Sit to/from Stand;Bed to chair/wheelchair/BSC Sit to Stand: Min assist;Mod assist   Step pivot transfers: Max assist       General transfer comment: mod A for 1x sit<>stand, min A for 2nd attempt. continues to required max A for small stepping at bed side to chair, pt "freezing" and highly distractable.     Balance Overall balance assessment: Needs assistance Sitting-balance support: Feet supported Sitting balance-Leahy Scale: Good     Standing balance support: No upper extremity supported Standing balance-Leahy Scale: Poor Standing balance comment: reliant on therapist                           ADL either performed or assessed with clinical judgement   ADL Overall ADL's : Needs assistance/impaired                 Upper  Body Dressing : Moderate assistance Upper Body Dressing Details (indicate cue type and reason): donned gown while long sitting in bed. Attempted set-up only, however pt ultimately required incrased assist for sequencing and initiation Lower Body Dressing: Set up;Bed level Lower Body  Dressing Details (indicate cue type and reason): long sitting in bed, pt donned bilat socks Toilet Transfer: Maximal assistance;Stand-pivot Toilet Transfer Details (indicate cue type and reason): pt stood with mod A to power up, required max A for balance in standing and multimodial step-by-step cues for SP. No RW this session. Pt continues to "freeze" with each movement.         Functional mobility during ADLs: Maximal assistance;Cueing for sequencing General ADL Comments: Pt did better today with following commands with multimodial cues.Continues to be a heavy assist due to cognition. pt not perseverating on L foot pain, but did grimiace with weight bearing.    Extremity/Trunk Assessment Upper Extremity Assessment Upper Extremity Assessment: Difficult to assess due to impaired cognition   Lower Extremity Assessment Lower Extremity Assessment: Defer to PT evaluation        Vision   Vision Assessment?: No apparent visual deficits   Perception Perception Perception: Within Functional Limits   Praxis Praxis Praxis: Intact    Cognition Arousal/Alertness: Awake/alert Behavior During Therapy: Flat affect Overall Cognitive Status: No family/caregiver present to determine baseline cognitive functioning Area of Impairment: Orientation;Attention;Memory;Following commands;Safety/judgement;Awareness;Problem solving                 Orientation Level: Disoriented to;Place;Situation Current Attention Level: Focused;Sustained Memory: Decreased short-term memory Following Commands: Follows one step commands inconsistently Safety/Judgement: Decreased awareness of deficits;Decreased awareness of safety Awareness: Intellectual Problem Solving: Slow processing;Requires verbal cues;Requires tactile cues;Decreased initiation General Comments: Pt continues to demonstrate some paranoid-like behaviors. He had improved command following this session, but continues to ignore/not initiate some  tasks and requires assist and tactile cues. Pt restless, highly distractable and fidgeting this session. Gave pt his urinal & he attempted to place gait belt into container.          Exercises     Shoulder Instructions       General Comments VSS on RA    Pertinent Vitals/ Pain       Pain Assessment: Faces Faces Pain Scale: Hurts little more Pain Location: L foot ankle Pain Descriptors / Indicators: Aching;Guarding;Grimacing Pain Intervention(s): Limited activity within patient's tolerance;Monitored during session  Home Living                                          Prior Functioning/Environment              Frequency  Min 2X/week        Progress Toward Goals  OT Goals(current goals can now be found in the care plan section)  Progress towards OT goals: Goals met and updated - see care plan  Acute Rehab OT Goals Patient Stated Goal: "get that belt" OT Goal Formulation: With patient Time For Goal Achievement: 02/13/21 Potential to Achieve Goals: Fair ADL Goals Pt Will Perform Grooming: with modified independence;standing Pt Will Perform Lower Body Bathing: with modified independence;sit to/from stand Pt Will Perform Lower Body Dressing: with modified independence;sit to/from stand Pt Will Transfer to Toilet: with modified independence;ambulating Pt Will Perform Toileting - Clothing Manipulation and hygiene: with modified independence;sit to/from stand Additional ADL Goal #1: Pt will demonstrate decreased pain to  LEs by tolerating light touch as needed for LE ADLs in order to increase independence with and tolerance for self care.  Plan Discharge plan remains appropriate;Other (comment)    Co-evaluation                 AM-PAC OT "6 Clicks" Daily Activity     Outcome Measure   Help from another person eating meals?: None Help from another person taking care of personal grooming?: A Little Help from another person toileting, which  includes using toliet, bedpan, or urinal?: A Lot Help from another person bathing (including washing, rinsing, drying)?: A Lot Help from another person to put on and taking off regular upper body clothing?: A Little Help from another person to put on and taking off regular lower body clothing?: A Little 6 Click Score: 17    End of Session Equipment Utilized During Treatment: Gait belt  OT Visit Diagnosis: Unsteadiness on feet (R26.81);Muscle weakness (generalized) (M62.81);Dizziness and giddiness (R42) Pain - Right/Left: Left Pain - part of body: Leg;Ankle and joints of foot   Activity Tolerance Patient tolerated treatment well   Patient Left in chair;with call bell/phone within reach;with chair alarm set   Nurse Communication Mobility status        Time: 0475-3391 OT Time Calculation (min): 12 min  Charges: OT General Charges $OT Visit: 1 Visit OT Treatments $Therapeutic Activity: 8-22 mins   Kaylyne Axton A Orit Sanville 02/01/2021, 12:13 PM

## 2021-02-02 LAB — IRON AND TIBC
Iron: 167 ug/dL (ref 45–182)
Saturation Ratios: 53 % — ABNORMAL HIGH (ref 17.9–39.5)
TIBC: 314 ug/dL (ref 250–450)
UIBC: 147 ug/dL

## 2021-02-02 LAB — FOLATE: Folate: 42.9 ng/mL (ref >5.9)

## 2021-02-02 LAB — RETICULOCYTES
Immature Retic Fract: 2.6 % (ref 2.3–15.9)
RBC.: 3.8 MIL/uL — ABNORMAL LOW (ref 4.22–5.81)
Retic Count, Absolute: 65.4 10*3/uL (ref 19.0–186.0)
Retic Ct Pct: 1.7 % (ref 0.4–3.1)

## 2021-02-02 LAB — VITAMIN B12: Vitamin B-12: 581 pg/mL (ref 180–914)

## 2021-02-02 LAB — FERRITIN: Ferritin: 221 ng/mL (ref 24–336)

## 2021-02-02 LAB — TSH: TSH: 0.786 u[IU]/mL (ref 0.350–4.500)

## 2021-02-02 MED ORDER — QUETIAPINE FUMARATE 200 MG PO TABS
200.0000 mg | ORAL_TABLET | Freq: Every day | ORAL | Status: DC
  Administered 2021-02-02 – 2021-02-04 (×3): 200 mg via ORAL
  Filled 2021-02-02 (×3): qty 1

## 2021-02-02 MED ORDER — THIAMINE HCL 100 MG/ML IJ SOLN
500.0000 mg | INTRAVENOUS | Status: DC
  Administered 2021-02-02 – 2021-02-03 (×2): 500 mg via INTRAVENOUS
  Filled 2021-02-02 (×2): qty 5

## 2021-02-02 MED ORDER — QUETIAPINE FUMARATE 100 MG PO TABS
100.0000 mg | ORAL_TABLET | Freq: Every day | ORAL | Status: DC
  Administered 2021-02-03 – 2021-02-04 (×2): 100 mg via ORAL
  Filled 2021-02-02 (×3): qty 1

## 2021-02-02 NOTE — Progress Notes (Signed)
[facility] PROGRESS NOTE  Steve [name] [id number] DOB: [birth year] DOA: 01/05/2021 PCP: No primary care provider on file.  Status: Remains inpatient appropriate because:  Unsafe discharge Not medically stable secondary to ongoing physical therapy needs  Barriers to discharge: Social: Homeless-unclear if can return to sober living facility Patient does not have funding in place for long-term care at SNF or ALF  Clinical: Evaluated by PT who documented paranoia like behavior.  OT documented patient continues to require mod to max assist for cognition.  Also noted with paranoid like behaviors is highly distractible throughout and had nonsensical answers to simple questions and commands.  Required mod to max assist for out of bed ADLs and transfers due to "freezing" and inconsistently following commands.  Recommendation is for SNF  Level of care:  Med-Surg   Code Status: Full Family Communication: Patient only DVT prophylaxis: Subcutaneous heparin COVID vaccination status: Unknown    HPI: 56 y.o. male with history of major depression, anxiety who was in a group home for last 5 months. He was brought to the ED on 12/20 from a group home for convalescence, combativeness and altered sensorium.  Per collateral history, patient was not acting typical for last few days. In the ED, patient was confused, combative Labs showed AKI with creatinine of 3.27, rhabdomyolysis, abnormal LFTs, elevated WBC count at 23.7 and lactic acid level at 7.6. 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  IV fluid resuscitation started Patient was admitted to ICU. 12/21, underwent LP which showed staph in CSF, felt to be contaminant.  EEG did not show any seizures 12/24, HD catheter was placed but patient did not require HD as  creatinine started to improve 12/31, with clinical improvement, patient was transferred out of ICU to [facility].  Subjective: Very bland flat affect.  Has not eaten any of breakfast tray.  Denied any leg pain.  Objective: Vitals:   02/02/21 0010 02/02/21 0419  BP: 125/89 (!) 146/97  Pulse: 90 96  Resp: 18 17  Temp: 98.5 F (36.9 C) 98.3 F (36.8 C)  SpO2: 100% 100%    Intake/Output Summary (Last 24 hours) at 02/02/2021 0809 Last data filed at 02/01/2021 2030 Gross per 24 hour  Intake 120 ml  Output 250 ml  Net -130 ml   Filed Weights   01/28/21 0339 01/30/21 0605 02/02/21 0500  Weight: 60 kg 59.3 kg 60.5 kg    Exam:  Constitutional: NAD, calm, comfortable Respiratory: clear to auscultation bilaterally, no wheezing, no crackles. Normal respiratory effort. No accessory muscle use.  Cardiovascular: Regular rate and rhythm, no murmurs / rubs / gallops. No extremity edema.  Abdomen: no tenderness, no masses palpated. Bowel sounds positive. LBM 1/15 Neurologic: CN 2-12 grossly intact. Sensation intact, Strength 5/5 x all 4 extremities.  Psychiatric: Flat affect and oriented x name only today.   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 dose to 200 mg to see if behaviors improve-if behaviors actually worsen consider Seroquel as culprit then we will need to taper  and discontinue  Left lower extremity pain, weakness. Work-up including imaging unremarkable Does have a history of smoking therefore could benefit from PVD evaluation with arterial duplex and  ABI Psychiatry has evaluated patient and feels there is a component of secondary gain with patient's reports of leg pain and difficulty mobilizing   Seizure Generalized convulsion was noted by his roommate prior to presentation. No apparent prior history of seizure disorder noting patient was not on AEDs prior to admission Unclear if withdrawal seizure or related to other substance-UDS positive for benzodiazepines and was probably obtained after was administered this class of medications for acute seizure activity EEG unremarkable.  Neurology consult appreciated.  Continue Vimpat for now.  Was transition to this drug from [place] given AKI which has now resolved.  Likely can transition to [place] at discharge   Acute renal failure secondary to rhabdomyolysis Acute metabolic acidosis His creatinine continue to worsen to peak at 7.48 on 12/26.  Nephrology consult was obtained.  He was prepped for dialysis, had a HD catheter placed but never needed dialysis.  Creatinine gradually improved to normal.  Serum bicarb level improved as well. Nephrology consult appreciated   Rhabdomyolysis Essentially resolved with IV fluid Peak CK greater than 50,000 and has decreased to 376 as of 1/3   Uremic encephalopathy Was combative on admission and now has more bland flaccid and somewhat paranoid affect Patient reported history of major depression and anxiety.   Continue Prozac, Seroquel, BuSpar, trazodone     Ruled out upper extremity DVT Per critical care note, hemodialysis cath had a clot on it when it was removed.  Ultrasound duplex scan of upper extremities was obtained on 12/31 which did not show any evidence of DVT.  Because of his risk of falls, noncompliance, recurrent seizures, I would avoid long-term anticoagulation without definite evidence of DVT.     Aspiration pneumonia Completed a course of Zosyn   Acute liver injury His AST and ALT were significantly elevated to over 700s on admission.   Gradually improved to normal.   Evidence of right ventricular dysfunction and recent echocardiogram Echo 1/2 with EF 60 to 65%.  Cannot rule out a small PFO. Not clinically significant as patient has no symptoms related to it. Continue to monitor as an outpatient.    Data Reviewed: Basic Metabolic Panel: Recent Labs  Lab 01/28/21 0208  NA 134*  K 3.6  CL 102  CO2 23  GLUCOSE 97  BUN 20  CREATININE 0.90  CALCIUM 9.5  MG 2.1   Liver Function Tests: Recent Labs  Lab 01/28/21 0208  AST 22  ALT 22  ALKPHOS 52  BILITOT 0.7  PROT 8.1  ALBUMIN 4.0   No results for input(s): LIPASE, AMYLASE in the last 168 hours. No results for input(s): AMMONIA in the last 168 hours. CBC: Recent Labs  Lab 01/28/21 0208  WBC 5.4  NEUTROABS 1.5*  HGB 11.6*  HCT 34.1*  MCV 94.5  PLT 631*     Scheduled Meds:  amLODipine  2.5 mg Oral Daily   aspirin  325 mg Oral Daily   Or   aspirin  300 mg Rectal Daily   busPIRone  10 mg Oral BID   docusate sodium  100 mg Oral BID   feeding supplement  237 mL Oral BID BM   FLUoxetine  40 mg Oral Daily   folic acid  1 mg Oral Daily   gabapentin  600 mg Oral TID   heparin injection (subcutaneous)  5,000  Units Subcutaneous Q8H   lacosamide  100 mg Oral BID   lidocaine  1 patch Transdermal Q24H   mouth rinse  15 mL Mouth Rinse BID   nicotine  21 mg Transdermal Daily   polyethylene glycol  17 g Oral Daily   QUEtiapine  100 mg Oral QHS   QUEtiapine  50 mg Oral Daily   sodium chloride flush  3 mL Intravenous Q12H   traZODone  50 mg Oral QHS   Continuous Infusions:  sodium chloride Stopped (01/08/21 2354)    Principal Problem:   Acute metabolic encephalopathy Active Problems:   Severe sepsis without septic shock ([name])   Fever   Pressure injury of skin   Consultants: Nephrology Psychiatry  Procedures: EEG Echocardiogram TDC  Antibiotics: Unasyn x1 dose, cefepime x1 dose, ceftriaxone x1 dose, vancomycin x2 doses   Time spent:  35 minutes    Erin Hearing ANP  [facility] 7 am - 330 pm/M-F for direct patient care and secure chat Please refer to Amion for contact info 28  days

## 2021-02-02 NOTE — Progress Notes (Signed)
Physical Therapy Treatment Patient Details Name: Steve [name] MRN: [id number] DOB: [birth year] Today's Date: 02/02/2021   History of Present Illness Pt is a 56 y.o. male admitted from group home (substance abuse rehab?) on 01/05/21 with witnessed seizure, AMS. 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 EEG unremarkable. LP on 12/21 showed staph in CSF, felt to be contaminant. Workup for AKI, rhabdomyolysis. Course complicated by c/o L foot pain; imaging negative for acute injury. Pt remains hospitalized due to difficult to place. Pt with psych consult who felt no need for inpt psych. No PMH in chart.    PT Comments    Pt continues to demonstrate inconsistent performance (appeared to be sitting EOB easily at arrival but when asked to sit took increased time and cues) and inconsistent pain presentation (bridges, weight bears with light use of RW but when taking 1 step without RW signs of severe pain).  He did ambulate today with RW for 100'.  Pt requiring at least mod cues for all transfers.  Continues to exhibits paranoid like behaviors. Pt not demonstrating cognition safe to return to independent living.  Pt was evaluated by psych who reports no inpt needs.     Recommendations for follow up therapy are one component of a multi-disciplinary discharge planning process, led by the attending physician.  Recommendations may be updated based on patient status, additional functional criteria and insurance authorization.  Follow Up Recommendations  Skilled nursing-short term rehab (<3 hours/day)     Assistance Recommended at Discharge Frequent or constant Supervision/Assistance  Patient can return home with the following A little help with walking and/or transfers;A little help with bathing/dressing/bathroom;Assistance with cooking/housework;Direct supervision/assist for financial  management;Assist for transportation;Help with stairs or ramp for entrance   Equipment Recommendations  Rolling walker (2 wheels)    Recommendations for Other Services       Precautions / Restrictions Precautions Precautions: Fall Restrictions Other Position/Activity Restrictions: CAM boot not needed per ortho note     Mobility  Bed Mobility Overal bed mobility: Needs Assistance Bed Mobility: Supine to Sit, Sit to Supine     Supine to sit: Min assist, Supervision Sit to supine: Supervision   General bed mobility comments: When therapist walked in door pt was beginning to sit on EOB independently, when he saw therapist quickly returned to supine.  Asked pt if he was wanting to get up but he ignores therapist.  Educated on PT role , got pt socks, and cued pt to sit to go for a walk.  With increased time, lots of fidgeting, weight shifting, bridging puting weight into L LE, pt eventually sat EOB but required therapist assist to direct legs off bed.  On return to bed , pt let go of RW early, appeared to be in pain with L foot, and went into bed on hands and knees requiring max cues to return to supine    Transfers Overall transfer level: Needs assistance Equipment used: Rolling walker (2 wheels) Transfers: Sit to/from Stand Sit to Stand: Min guard           General transfer comment: Min guard and increased cues    Ambulation/Gait Ambulation/Gait assistance: Min assist Gait Distance (Feet): 100 Feet Assistive device: Rolling walker (2 wheels) Gait Pattern/deviations: Step-through pattern Gait velocity: decreased     General Gait Details: Pt with step through pattern requiring mod cues and min A to direct RW.  Pt weight bearing on L LE without signficant signs of pain with RW use - appeared to be light use of hands on RW.  Pt attempting to go in other rooms requiring cues and assist to avoid.  Pt did let go of RW too soon on return to bed and then did demonstrate antalgic  pattern and difficulty weight bearing on L   Stairs             Wheelchair Mobility    Modified Rankin (Stroke Patients Only)       Balance Overall balance assessment: Needs assistance Sitting-balance support: Feet supported Sitting balance-Leahy Scale: Good     Standing balance support: Bilateral upper extremity supported, Reliant on assistive device for balance Standing balance-Leahy Scale: Poor                              Cognition Arousal/Alertness: Awake/alert Behavior During Therapy: Anxious Overall Cognitive Status: No family/caregiver present to determine baseline cognitive functioning                                 General Comments: Pt continues to demonstrate some paranoid-like behaviors (fidgeting with gown, looking around, not talking to therapist, stated "she wants him out of here.". Follows some commands, but continues to ignore/not initiate some tasks and requires assist and tactile cues. Pt restless, highly distractable and fidgeting this session.        Exercises      General Comments General comments (skin integrity, edema, etc.): Pt not able to further partcipate with exercises.  Did examine L foot.  With inital touch pt withdraws, but then therapist able to palpate without signs of pain.  Pt pointed that pain was between digits 2 and 3 metatarsal heads - did not exhibit signs of pain with pressure      Pertinent Vitals/Pain Pain Assessment Pain Assessment: Faces Faces Pain Scale: Hurts a little bit Pain Location: L foot Pain Descriptors / Indicators: Guarding, Grimacing (inconsistent presentation) Pain Intervention(s): Limited activity within patient's tolerance, Monitored during session    Home Living                          Prior Function            PT Goals (current goals can now be found in the care plan section) Progress towards PT goals: Progressing toward goals    Frequency    Min  2X/week      PT Plan Discharge plan needs to be updated    Co-evaluation              AM-PAC PT "6 Clicks" Mobility   Outcome Measure  Help needed turning from your back to your side while in a flat bed without using bedrails?: None Help needed moving from lying on your back to sitting on the side of a flat bed without using bedrails?: A Lot Help needed moving to and from a bed to a chair (including a wheelchair)?: A Lot Help needed standing up from a chair using your arms (e.g., wheelchair or bedside chair)?: A Lot Help needed to walk in hospital room?: A Lot Help needed climbing 3-5 steps with a railing? : Total 6 Click Score: 13    End of Session Equipment Utilized During Treatment: Gait belt Activity Tolerance: Other (comment) (tolerated more than prior  session still limited by cognition/behavior) Patient left: in bed;with call bell/phone within reach;with bed alarm set Nurse Communication: Mobility status PT Visit Diagnosis: Unsteadiness on feet (R26.81);Pain Pain - Right/Left: Left Pain - part of body: Ankle and joints of foot     Time: [id number] PT Time Calculation (min) (ACUTE ONLY): 19 min  Charges:  $Gait Training: 8-22 mins                     Anise Salvoacia, PT Acute Rehab Services Pager [phone] [facility] [phone]    Rayetta HumphreyDacia H Alisyn Lequire 02/02/2021, 4:28 PM

## 2021-02-03 MED ORDER — THIAMINE HCL 100 MG/ML IJ SOLN
500.0000 mg | Freq: Three times a day (TID) | INTRAVENOUS | Status: AC
  Administered 2021-02-03 – 2021-02-07 (×13): 500 mg via INTRAVENOUS
  Filled 2021-02-03 (×14): qty 5

## 2021-02-03 NOTE — Progress Notes (Signed)
Occupational Therapy Treatment Patient Details Name: Steve [name] MRN: [id number] DOB: Mar 19, 1965 Today's Date: 02/03/2021   History of present illness Pt is a 56 y.o. male admitted from group home (substance abuse rehab?) on 01/05/21 with witnessed seizure, AMS. 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 EEG unremarkable. LP on 12/21 showed staph in CSF, felt to be contaminant. Workup for AKI, rhabdomyolysis. Course complicated by c/o L foot pain; imaging negative for acute injury. Pt remains hospitalized due to difficult to place. Pt with psych consult who felt no need for inpt psych. No PMH in chart.   OT comments  Steve [name] is progressing incrementally. Session focused on further cognitive assessment. Pt was oriented to self only, stating the year as 1997 and unable to state any other orientation questions despite having two choices. He continues to fluctuate in his functional mobility, attempting to get OOB indep upon arrival but requiring mod A to sit EOB with therapist in the room. SBT and Medi-Cog assessments attempted today with score of 0 on each. Pt unable to complete any sections of the assessments despite max cues. He had multiple nonsensical answers and had restless behaviors, constantly waving at the closet and checking behind him and out of the windows. Pt continues to benefit from OT acutely. D/c recommendation to SNF for cognition and safety - Per TOC note, planning to d/c pt to homeless shelter.    Recommendations for follow up therapy are one component of a multi-disciplinary discharge planning process, led by the attending physician.  Recommendations may be updated based on patient status, additional functional criteria and insurance authorization.    Follow Up Recommendations  Skilled nursing-short term rehab (<3 hours/day)    Assistance Recommended at Discharge  Frequent or constant Supervision/Assistance  Patient can return home with the following  A little help with walking and/or transfers;A little help with bathing/dressing/bathroom;Assistance with cooking/housework;Assist for transportation;Help with stairs or ramp for entrance   Equipment Recommendations  None recommended by OT       Precautions / Restrictions Precautions Precautions: Fall Precaution Comments: h/o L foot pain Restrictions Weight Bearing Restrictions: No       Mobility Bed Mobility Overal bed mobility: Needs Assistance Bed Mobility: Supine to Sit, Sit to Supine     Supine to sit: Mod assist Sit to supine: Mod assist   General bed mobility comments: for cues and physical assist    Transfers Overall transfer level: Needs assistance                 General transfer comment: session spent at bed level     Balance Overall balance assessment: Needs assistance Sitting-balance support: Feet supported Sitting balance-Leahy Scale: Good                                     ADL either performed or assessed with clinical judgement   ADL Overall ADL's : Needs assistance/impaired                 Upper Body Dressing : Moderate assistance;Bed level Upper Body Dressing Details (indicate cue type and reason): mod A to find each arm hole, and to keep gown on.                   General ADL Comments: session focuses on assessing cognition.  Extremity/Trunk Assessment Upper Extremity Assessment Upper Extremity Assessment: Difficult to assess due to impaired cognition   Lower Extremity Assessment Lower Extremity Assessment: Defer to PT evaluation        Vision   Vision Assessment?: No apparent visual deficits          Cognition Arousal/Alertness: Awake/alert Behavior During Therapy: Restless, Anxious Overall Cognitive Status: Impaired/Different from baseline         General Comments: Oriented to self only. 1997 as the  year, unable to state place or month despite having 2 choices. Attempted SBT, pt unable to immediately recall,  count back from 20, or state the months. When asked to write his name pt wrote "Jomms A54m." unable to write any numbers on a clock. Pt continues to demonstrate paranoid-like bahviors, waving at the closet in the room & frantically looking out the window. Pt more alert and responsive today, able to state his name and pain location (ankle). Unable to follow simple functioanl commands (sit on the EOB, move towards HOB, etc). Continues to have nonsensical answers/comments throughout.              General Comments VSS on RA    Pertinent Vitals/ Pain       Pain Assessment Pain Assessment: Faces Faces Pain Scale: Hurts little more Pain Location: L foot Pain Descriptors / Indicators: Guarding, Grimacing Pain Intervention(s): Limited activity within patient's tolerance, Monitored during session  Home Living     Available Help at Discharge: Available 24 hours/day;Other (Comment) Type of Home: Group Home           Frequency  Min 2X/week        Progress Toward Goals  OT Goals(current goals can now be found in the care plan section)  Progress towards OT goals: Progressing toward goals  Acute Rehab OT Goals Patient Stated Goal: jelly donut OT Goal Formulation: With patient Time For Goal Achievement: 02/13/21 Potential to Achieve Goals: Fair ADL Goals Pt Will Perform Grooming: with modified independence;standing Pt Will Perform Lower Body Bathing: with modified independence;sit to/from stand Pt Will Perform Lower Body Dressing: with modified independence;sit to/from stand Pt Will Transfer to Toilet: with modified independence;ambulating Pt Will Perform Toileting - Clothing Manipulation and hygiene: with modified independence;sit to/from stand Additional ADL Goal #1: Pt will demonstrate decreased pain to LEs by tolerating light touch as needed for LE ADLs in order to  increase independence with and tolerance for self care.  Plan Discharge plan remains appropriate;Other (comment)       AM-PAC OT "6 Clicks" Daily Activity     Outcome Measure   Help from another person eating meals?: None Help from another person taking care of personal grooming?: A Little Help from another person toileting, which includes using toliet, bedpan, or urinal?: A Lot Help from another person bathing (including washing, rinsing, drying)?: A Lot Help from another person to put on and taking off regular upper body clothing?: A Little Help from another person to put on and taking off regular lower body clothing?: A Little 6 Click Score: 17    End of Session    OT Visit Diagnosis: Unsteadiness on feet (R26.81);Muscle weakness (generalized) (M62.81);Dizziness and giddiness (R42) Pain - Right/Left: Left Pain - part of body: Leg;Ankle and joints of foot   Activity Tolerance Patient tolerated treatment well   Patient Left in bed;with call bell/phone within reach   Nurse Communication Mobility status        Time: 1150-1210 OT Time Calculation (min): 20 min  Charges: OT General Charges $OT Visit: 1 Visit OT Treatments $Therapeutic Activity: 8-22 mins   Kingdavid Leinbach A Aniayah Alaniz 02/03/2021, 2:00 PM

## 2021-02-03 NOTE — Evaluation (Signed)
Speech Language Pathology Evaluation Patient Details Name: Steve [name] MRN: [id number] DOB: 1965/12/24 Today's Date: 02/03/2021 Time: [id number] SLP Time Calculation (min) (ACUTE ONLY): 34 min  Problem List:  Patient Active Problem List   Diagnosis Date Noted   Pressure injury of skin 01/13/2021   Fever    Severe sepsis without septic shock ([place])    Acute metabolic encephalopathy [id number]   Past Medical History: History reviewed. No pertinent past medical history. Past Surgical History: History reviewed. No pertinent surgical history. HPI:  Pt is a 56 y.o. male admitted from group home (substance abuse rehab?) on 01/05/21 with witnessed seizure, AMS. 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 EEG unremarkable. LP on 12/21 showed staph in CSF, felt to be contaminant. Workup for AKI, rhabdomyolysis. Dx Uremic encephalopathy. Course complicated by c/o L foot pain; imaging negative for acute injury. Pt remains hospitalized due to difficulty with placement. SLP evaluation on 01/26/21: 9/27 on [facility] consulted and as of 1/16, pt did not meet criteria for inpatient psychiatric admission. No PMH in chart.   Assessment / Plan / Recommendation Clinical Impression  Pt participated in speech/language/cognition evaluation. Pt was unable to reliably provide information regarding his baseline or any acute changes. He was very easily distracted throughout the evaluation. He often suddenly looked through the window as if he were searching for something. Upon inquiry about pt's searching behaviors, pt stated "Making sure the plane don't go plung" When SLP requested clarity, pt stated, "Make sure my brother don't fly that plane; he's a good guy, but whew!" Repetition was needed throughout the evaluation for pt to attend to questions and provide responses. No motor  speech deficits were noted. [facility] Mental Status Examination was completed to evaluate the pt's cognitive-linguistic skills. He achieved a score of 2/30 which is significantly below the normal limits of 27 or more out of 30. He exhibited deficits in the areas of awareness, orientation, attention, memory, problem solving, and executive function. Pt's linguistic performance was negatively impacted by significant impairments in attention and memory, and he exhibited pragmatic language deficits in the areas of global coherence, topic appropriateness, eye contact, and affect. It is recommended that pt receive SLP services at next venue of care if this is not the pt's baseline, and if his symptoms do not resolve after treatment of acute illness. Further acute skilled SLP services are not clinically indicated at this time.    SLP Assessment  SLP Recommendation/Assessment: All further Speech Lanaguage Pathology  needs can be addressed in the next venue of care SLP Visit Diagnosis: Cognitive communication deficit (R41.841);Attention and concentration deficit    Recommendations for follow up therapy are one component of a multi-disciplinary discharge planning process, led by the attending physician.  Recommendations may be updated based on patient status, additional functional criteria and insurance authorization.    Follow Up Recommendations  Follow physician's recommendations for discharge plan and follow up therapies    Assistance Recommended at Discharge  Frequent or constant Supervision/Assistance  Functional Status Assessment    Frequency and Duration           SLP Evaluation Cognition  Overall Cognitive Status: Impaired/Different from baseline Arousal/Alertness: Awake/alert Orientation Level: Oriented to person;Disoriented to time;Disoriented to situation Year: 2022 Day of Week: Incorrect Attention: Focused;Sustained Focused Attention: Impaired Focused Attention  Impairment: Verbal basic Sustained Attention: Impaired Sustained Attention Impairment: Verbal  basic Memory: Impaired Memory Impairment: Storage deficit;Decreased recall of new information;Decreased short term memory Decreased Short Term Memory: Verbal basic;Verbal complex Immediate Memory Recall:  (Immediate: 1/5; delayed: 0/5) Awareness: Impaired Awareness Impairment: Intellectual impairment Problem Solving: Impaired Problem Solving Impairment: Verbal basic Executive Function: Reasoning;Sequencing Reasoning: Impaired Reasoning Impairment: Verbal basic Sequencing: Impaired Sequencing Impairment: Verbal basic;Verbal complex (Clock drawing: 0/4) Behaviors: Restless Safety/Judgment: Impaired       Comprehension  Auditory Comprehension Yes/No Questions: Impaired Basic Biographical Questions:  (5/5) Complex Questions:  (4/5 with repetition) Commands: Impaired One Step Basic Commands:  (2/3) Two Step Basic Commands:  (2/3) Conversation: Simple    Expression Expression Primary Mode of Expression: Verbal Verbal Expression Overall Verbal Expression: Impaired Initiation: Impaired Automatic Speech: Counting;Day of week (WNL with cues for initiation) Level of Generative/Spontaneous Verbalization: Conversation Pragmatics: Impairment Impairments: Topic maintenance;Topic appropriateness;Eye contact;Abnormal affect Interfering Components: Attention   Oral / Motor  Oral Motor/Sensory Function Overall Oral Motor/Sensory Function: Within functional limits Motor Speech Overall Motor Speech: Appears within functional limits for tasks assessed Respiration: Within functional limits Level of Impairment: Conversation Phonation: Normal Resonance: Within functional limits Intelligibility: Intelligible Motor Planning: Witnin functional limits Motor Speech Errors: Not applicable           Aniella Wandrey I. Hardin Negus, [place], [place] Office number [phone] Pager  [place] 02/03/2021, 2:16 PM

## 2021-02-03 NOTE — Progress Notes (Signed)
[facility] PROGRESS NOTE  Terrilyn SaverJames Mccree [id number] DOB: 1965/10/18 DOA: 01/05/2021 PCP: No primary care provider on file.  Status: Remains inpatient appropriate because:  Unsafe discharge Not medically stable secondary to ongoing physical therapy needs  Barriers to discharge: Social: Homeless-unclear if can return to sober living facility Patient does not have funding in place for long-term care at SNF or ALF  Clinical: Evaluated by PT who documented paranoia like behavior.  OT documented patient continues to require mod to max assist for cognition.  Also noted with paranoid like behaviors is highly distractible throughout and had nonsensical answers to simple questions and commands.  Required mod to max assist for out of bed ADLs and transfers due to "freezing" and inconsistently following commands.  Recommendation is for SNF  Level of care:  Med-Surg   Code Status: Full Family Communication: Patient only DVT prophylaxis: Subcutaneous heparin COVID vaccination status: Unknown    HPI: 56 y.o. male with history of major depression, anxiety who was in a group home for last 5 months. He was brought to the ED on 12/20 from a group home for convalescence, combativeness and altered sensorium.  Per collateral history, patient was not acting typical for last few days. In the ED, patient was confused, combative Labs showed AKI with creatinine of 3.27, rhabdomyolysis, abnormal LFTs, elevated WBC count at 23.7 and lactic acid level at 7.6. 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  IV fluid resuscitation started Patient was admitted to ICU. 12/21, underwent LP which showed staph in CSF, felt to be contaminant.  EEG did not show any seizures 12/24, HD catheter was placed but patient did not require HD as  creatinine started to improve 12/31, with clinical improvement, [facility]  Subjective: Awake, more interactive today.  Is able to state that he had a 30-year history of alcohol abuse when asked questions regarding prior residence he stated [place] was in [place] and pointing out the window stating "over there" but was unable to give any other details such as name of apartment complex, homeless shelter etc. patient stated he would not have anybody to discharge to regarding lodging.  Denied history of schizophrenia or schizoaffective disorder but states he does have a longstanding history of depression.  Objective: Vitals:   02/03/21 0002 02/03/21 0352  BP: 109/74 115/78  Pulse: 74 77  Resp: 18 17  Temp: 98.8 F (37.1 C) 97.9 F (36.6 C)  SpO2: 100% 100%    Intake/Output Summary (Last 24 hours) at 02/03/2021 0801 Last data filed at 02/02/2021 1900 Gross per 24 hour  Intake 50 ml  Output 375 ml  Net -325 ml   Filed Weights   01/28/21 0339 01/30/21 0605 02/02/21 0500  Weight: 60 kg 59.3 kg 60.5 kg    Exam:  Constitutional: NAD, calm, comfortable Respiratory: clear to auscultation bilaterally, no wheezing, no crackles. Normal respiratory effort. No accessory muscle use.  Cardiovascular: Regular pulse, no peripheral edema.  Normotensive.  S1-S2 Abdomen: no tenderness, no masses palpated. Bowel sounds positive. LBM 1/15 Neurologic: CN 2-12 grossly intact. Sensation intact, Strength 5/5 x all 4 extremities.  Psychiatric: Flat affect and oriented x name and place but not to year.  Able to provide history of 30 years of alcohol abuse and history of depression but had more difficulty answering questions regarding location/address of  prior residence before being placed in sober living.  When asked if he had been outside of the [place] area prior to being placed in sober living he stated yes.   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 He demonstrated difficulty with attention and memory which impacted his performance. He was unable to attend adequately for any higher-level executive function tasks, and he ultimately scored 2/30 on the SLUMS. OT cognitive evaluation 1/18: medi-cog and short blessed He scored 0 on both. Unable to complete the majority of the sections despite max cues. & wrote his name as "Jomms A71m." 1997 as the year. Unable to state location, month or time; even when given 2 choices. He was more responsive today, able to say his name and location of pain (ankle). However he was waving at the closet, and saying very off the wall things like "[employer]." Continues to not follow simple commands for bed mobility. Continue high-dose thiamine IV x5 days-first dose given #17  TSH and anemia panel within normal limits Continue Seroquel-increased daytime dose to 100 mg and p.m. dose to 200 mg on 1/70 to see if behaviors improve-if behaviors actually worsen consider Seroquel as culprit then we will need to taper and discontinue Will ask psych to reevaluate this patient given past medical history of alcohol abuse clarified.  Suspect patient will need MoCA screening HIV nonreactive  Left lower extremity pain, weakness. Work-up including imaging unremarkable Does have a history of smoking therefore could benefit from PVD evaluation with arterial duplex and ABI Psychiatry has evaluated patient and feels there is a component of secondary gain with patient's reports of leg pain and difficulty mobilizing   Seizure Generalized convulsion was noted by his roommate prior to presentation. No apparent prior history of seizure disorder noting patient was not  on AEDs prior to admission Unclear if withdrawal seizure or related to other substance-UDS positive for benzodiazepines and was probably obtained after was administered this class of medications for acute seizure activity EEG unremarkable.  Neurology consult appreciated.  Continue Vimpat for now.  Was transition to this drug from Keppra given AKI which has now resolved.  Likely can transition to Keppra at discharge   Acute renal failure secondary to rhabdomyolysis Acute metabolic acidosis His creatinine continue to worsen to peak at 7.48 on 12/26.  Nephrology consult was obtained.  He was prepped for dialysis, had a HD catheter placed but never needed dialysis.  Creatinine gradually improved to normal.  Serum bicarb level improved as well. Nephrology consult appreciated   Rhabdomyolysis Essentially resolved with IV fluid Peak CK greater than 50,000 and has decreased to 376 as of 1/3   Uremic encephalopathy Was combative on admission and now has more bland flaccid and somewhat paranoid affect Patient reported history of major depression and anxiety.   Continue Prozac, Seroquel, BuSpar, trazodone     Ruled out upper extremity DVT Per critical care note, hemodialysis cath had a clot on it when it was removed.  Ultrasound duplex scan of upper extremities was obtained on 12/31 which did not show any evidence of DVT.  Because of his risk of falls, noncompliance, recurrent seizures, I would avoid long-term anticoagulation without definite evidence of DVT.     Aspiration pneumonia Completed a course of Zosyn   Acute liver injury His  AST and ALT were significantly elevated to over 700s on admission.  Gradually improved to normal.   Evidence of right ventricular dysfunction and recent echocardiogram Echo 1/2 with EF 60 to 65%.  Cannot rule out a small PFO. Not clinically significant as patient has no symptoms related to it. Continue to monitor as an outpatient.    Data Reviewed: Basic  Metabolic Panel: Recent Labs  Lab 01/28/21 0208  NA 134*  K 3.6  CL 102  CO2 23  GLUCOSE 97  BUN 20  CREATININE 0.90  CALCIUM 9.5  MG 2.1   Liver Function Tests: Recent Labs  Lab 01/28/21 0208  AST 22  ALT 22  ALKPHOS 52  BILITOT 0.7  PROT 8.1  ALBUMIN 4.0   No results for input(s): LIPASE, AMYLASE in the last 168 hours. No results for input(s): AMMONIA in the last 168 hours. CBC: Recent Labs  Lab 01/28/21 0208  WBC 5.4  NEUTROABS 1.5*  HGB 11.6*  HCT 34.1*  MCV 94.5  PLT 631*     Scheduled Meds:  amLODipine  2.5 mg Oral Daily   aspirin  325 mg Oral Daily   Or   aspirin  300 mg Rectal Daily   busPIRone  10 mg Oral BID   docusate sodium  100 mg Oral BID   feeding supplement  237 mL Oral BID BM   FLUoxetine  40 mg Oral Daily   folic acid  1 mg Oral Daily   gabapentin  600 mg Oral TID   heparin injection (subcutaneous)  5,000 Units Subcutaneous Q8H   lacosamide  100 mg Oral BID   lidocaine  1 patch Transdermal Q24H   mouth rinse  15 mL Mouth Rinse BID   nicotine  21 mg Transdermal Daily   polyethylene glycol  17 g Oral Daily   QUEtiapine  100 mg Oral Daily   QUEtiapine  200 mg Oral QHS   sodium chloride flush  3 mL Intravenous Q12H   traZODone  50 mg Oral QHS   Continuous Infusions:  sodium chloride Stopped (01/08/21 2354)   thiamine injection Stopped (02/02/21 1900)    Principal Problem:   Acute metabolic encephalopathy Active Problems:   Severe sepsis without septic shock ([name])   Fever   Pressure injury of skin   Consultants: Nephrology Psychiatry  Procedures: EEG Echocardiogram TDC  Antibiotics: Unasyn x1 dose, cefepime x1 dose, ceftriaxone x1 dose, vancomycin x2 doses   Time spent: 35 minutes    Junious Silk ANP  [facility] 7 am - 330 pm/M-F for direct patient care and secure chat Please refer to Amion for contact info 29  days

## 2021-02-03 NOTE — TOC Progression Note (Addendum)
Transition of Care [facility]) - Progression Note    Patient Details  Name: Steve [name] MRN: [id number] Date of Birth: Oct 17, 1965  Transition [facility] Care [facility]) CM/SW [place], RN Phone Number: 02/03/2021, 10:43 AM  Clinical Narrative:    CM met with the patient at the bedside along with Erin Hearing, NP.  The patient states that he was living at [facility] prior to his hospitalization and is unable to return to the facility until he is ambulatory.  The patient was alert and oriented and answers questions regarding his history and prior living conditions prior to hospitalization.  The patient's son is unwilling to let him stay at his home in [place], [place].  The patient is currently using a rolling walker for ambulation with PT and has made improvements in mentation after IV Thiamine dose per Lissa Merlin, NP.    I spoke with the patient and stated that once he is physically able - patient would be discharged to the [facility] shelter, likely Monday 02/08/2021 once he has completed 5 days of IV high dose Thiamine per Lissa Merlin, NP.  PCP follow up was scheduled with [facility] with Juluis Mire, NP on March 01, 2021 at 1350.  Substance abuse follow up was placed in the discharge instructions.  The patient will be provided with TOC medications for discharge if needed prior to discharge.  Meantime, the patient will continue to work with therapy with physical strengthening.  CM and MSW will continue to follow the patient for discharge needs to homeless shelter.     Expected Discharge Plan: Homeless Shelter Barriers to Discharge: Continued Medical Work up, Homeless with medical needs, Inadequate or no insurance  Expected Discharge Plan and Services Expected Discharge Plan: Homeless Shelter In-house Referral: PCP / [facility] Discharge Planning Services: CM Consult, [facility], Medication Assistance, Follow-up appt scheduled, [facility]   Living  arrangements for the past 2 months: [facility] (Patient was resident at [facility] prior to hospital admission.) Expected Discharge Date: 01/24/21                                     Social Determinants of Health (SDOH) Interventions    Readmission Risk Interventions No flowsheet data found.

## 2021-02-04 DIAGNOSIS — I519 Heart disease, unspecified: Secondary | ICD-10-CM | POA: Diagnosis present

## 2021-02-04 DIAGNOSIS — F1027 Alcohol dependence with alcohol-induced persisting dementia: Secondary | ICD-10-CM | POA: Insufficient documentation

## 2021-02-04 DIAGNOSIS — R5381 Other malaise: Secondary | ICD-10-CM | POA: Diagnosis present

## 2021-02-04 DIAGNOSIS — E512 Wernicke's encephalopathy: Secondary | ICD-10-CM | POA: Diagnosis present

## 2021-02-04 NOTE — Plan of Care (Signed)
°  Problem: Education: °Goal: Knowledge of General Education information will improve °Description: Including pain rating scale, medication(s)/side effects and non-pharmacologic comfort measures °Outcome: Not Progressing °  °Problem: Health Behavior/Discharge Planning: °Goal: Ability to manage health-related needs will improve °Outcome: Not Progressing °  °Problem: Clinical Measurements: °Goal: Ability to maintain clinical measurements within normal limits will improve °Outcome: Progressing °  °

## 2021-02-04 NOTE — TOC Progression Note (Addendum)
Transition of Care [facility]) - Progression Note    Patient Details  Name: Steve [name] MRN: [id number] Date of Birth: 05/26/1965  Transition of Care [facility]) CM/SW Contact  Steve Bridgeman, RN Phone Number: 02/04/2021, 8:03 AM  Clinical Narrative:    CM spoke with Steve Silk, NP and the patient's cognitive testing was completed by OT yesterday.  CM will call and speak with the patient's son, Steve [name] today to discuss his willingness to make medical decisions for the patient and assist with financial counseling resources for the patient.  Message was sent to Steve [name], financial counseling supervisor to assist with Medicaid screening since this screening has not been completed during the patient's admission.  The patient will need likely assistance with placement since cognitive scores were poor in yesterday's assessment with OT.  02/04/2021 - CM spoke with the patient's son, Steve [name] on the phone and the son is willing to make medical decisions for the patient on the patient's behalf at this time.  He states that he lives with his wife and children at a home in [place], [place] and the patient is unable to discharge to his care at this time.  TOC will continue to follow the patient for discharge needs.  I spoke with Steve [name] and [facility] will follow up with the patient/family regarding disability/Medicaid screening. PT is recommending SNf placement at this time for care.    Expected Discharge Plan: Homeless Shelter Barriers to Discharge: Continued Medical Work up, Homeless with medical needs, Inadequate or no insurance  Expected Discharge Plan and Services Expected Discharge Plan: Homeless Shelter In-house Referral: PCP / [facility] Connect Discharge Planning Services: CM Consult, [facility], Medication Assistance, Follow-up appt scheduled, [facility]   Living arrangements for the past 2 months: Group Home (Patient was resident at [facility]  prior to hospital admission.) Expected Discharge Date: 01/24/21                                     Social Determinants of Health (SDOH) Interventions    Readmission Risk Interventions No flowsheet data found.

## 2021-02-04 NOTE — Progress Notes (Signed)
[facility] PROGRESS NOTE  Terrilyn SaverJames Elman ZHY:[id number] DOB: 1965-08-26 DOA: 01/05/2021 PCP: No primary care provider on file.  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 male with history of major depression, anxiety who was in a group home for last 5 months. He was brought to the ED on 12/20 from a group home for convalescence, combativeness and altered sensorium.  Per collateral history, patient was not acting typical for last few days. In the ED, patient was confused, combative Labs showed AKI with creatinine of 3.27, rhabdomyolysis, abnormal LFTs, elevated WBC count at 23.7 and lactic acid level at 7.6. 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  IV fluid resuscitation started Patient was admitted to ICU. 12/21, underwent LP which showed staph in CSF, felt to be contaminant.  EEG did not show any seizures 12/24, HD catheter was placed but patient did not require HD as creatinine started to improve 12/31, with clinical improvement, [facility]  Subjective: Extremely confused today.  Very restless.  Prior to my entering the room patient was  waving towards the window like he was waiting to a person standing outside the window.  Please note patient room is on the third floor.  Appears to have been incontinent of urine.  Denies any pain.  Objective: Vitals:   02/03/21 2357 02/04/21 0412  BP: 121/87 102/69  Pulse: 91 89  Resp: 19 20  Temp: 98.3 F (36.8 C) 97.9 F (36.6 C)  SpO2: 98% 97%    Intake/Output Summary (Last 24 hours) at 02/04/2021 0820 Last data filed at 02/04/2021 0414 Gross per 24 hour  Intake 161.91 ml  Output 550 ml  Net -388.09 ml   Filed Weights   02/02/21 0500 02/03/21 2357 02/04/21 0412  Weight: 60.5 kg 59.5 kg 59.5 kg    Exam:  Constitutional: NAD, restless Respiratory: clear to auscultation bilaterally, no wheezing, no crackles. Normal respiratory effort. RA Cardiovascular: Regular pulse, no peripheral edema.  Normotensive.  S1-S2 Abdomen: no tenderness, no masses palpated. Bowel sounds positive. LBM 1/18 Neurologic: CN 2-12 grossly intact. Sensation intact, Strength 3+-4/5 x all 4 extremities.  Psychiatric: Extremely restless today flat affect and oriented x name only.  Very confused.  [facility] and was unable to get an answer when asked what year it was.    Assessment/Plan: Acute problems: Significant cognitive impairment 2/2 alcoholic dementia/history of EtOH abuse x30+ years Patient continues with flat affect and demonstrates reported paranoid type behaviors and is having difficulty cognitively with performing executive functioning skills SLP cognitive evaluation 1/18: . He demonstrated difficulty with attention and memory which impacted his performance. He was unable to attend adequately for any higher-level executive function tasks, and he ultimately scored 2/30 on the SLUMS. OT cognitive  evaluation 1/18: medi-cog and short blessed He scored 0 on both. Unable to complete the majority of the sections despite max cues. & wrote his name as "Jomms A69m." 1997 as  the year. Unable to state location, month or time; even when given 2 choices. He was more responsive today, able to say his name and location of pain (ankle). However he was waving at the closet, and saying very off the wall things like "[employer]." Continues to not follow simple commands for bed mobility. Continue high-dose thiamine IV x5 days-first dose 1/17  TSH and anemia panel within normal limits Continue Seroquel,  Prozac, Seroquel, BuSpar, trazodone-has known history of major depressive disorder Suspect this will be his new baseline noting no significant improvement in the 29 days he has been hospitalized  Left lower extremity pain, weakness. Work-up including imaging unremarkable Does have a history of smoking therefore could benefit from PVD evaluation with arterial duplex and ABI   Seizure Generalized convulsion was noted by his roommate prior to presentation. No apparent prior history of seizure disorder noting patient was not on AEDs prior to admission Unclear if withdrawal seizure or related to other substance-UDS positive for benzodiazepines and was probably obtained after was administered this class of medications for acute seizure activity EEG unremarkable.  Neurology consult appreciated.  Continue Vimpat for now.  Was transition to this drug from Keppra given AKI which has now resolved.  Likely can transition to Keppra at discharge   Acute renal failure secondary to rhabdomyolysis Acute metabolic acidosis His creatinine continue to worsen to peak at 7.48 on 12/26.  Nephrology consult was obtained.  He was prepped for dialysis, had a HD catheter placed but never needed dialysis.  Creatinine gradually improved to normal.  Serum bicarb level improved as well. Nephrology consult appreciated   Rhabdomyolysis Essentially resolved with IV fluid Peak CK greater than 50,000 and has decreased to 376 as of 1/3   Ruled out upper extremity DVT Per critical care note, hemodialysis  cath had a clot on it when it was removed.  Ultrasound duplex scan of upper extremities was obtained on 12/31 which did not show any evidence of DVT.  Because of his risk of falls, noncompliance, recurrent seizures, I would avoid long-term anticoagulation without definite evidence of DVT.     Aspiration pneumonia Completed a course of Zosyn   Acute liver injury His AST and ALT were significantly elevated to over 700s on admission.  Gradually improved to normal.   Evidence of right ventricular dysfunction and recent echocardiogram Echo 1/2 with EF 60 to 65%.  Cannot rule out a small PFO. Not clinically significant as patient has no symptoms related to it. Continue to monitor as an outpatient.       Scheduled Meds:  amLODipine  2.5 mg Oral Daily   aspirin  325 mg Oral Daily   Or   aspirin  300 mg Rectal Daily   busPIRone  10 mg Oral BID   docusate sodium  100 mg Oral BID   feeding supplement  237 mL Oral BID BM   FLUoxetine  40 mg Oral Daily   folic acid  1 mg Oral Daily   gabapentin  600 mg Oral TID   heparin injection (subcutaneous)  5,000 Units Subcutaneous Q8H   lacosamide  100 mg Oral BID   lidocaine  1 patch Transdermal Q24H   mouth rinse  15 mL Mouth Rinse BID   nicotine  21 mg Transdermal Daily   polyethylene glycol  17 g Oral Daily   QUEtiapine  100 mg Oral Daily   QUEtiapine  200 mg Oral QHS   sodium chloride flush  3 mL Intravenous Q12H   traZODone  50 mg Oral QHS   Continuous Infusions:  sodium chloride Stopped (01/08/21 2354)   thiamine injection 500 mg (02/04/21 0539)    Principal Problem:   Acute metabolic encephalopathy Active Problems:   Severe sepsis without septic shock ([name])   Fever   Pressure injury of skin   Consultants: Nephrology Psychiatry  Procedures: EEG Echocardiogram TDC  Antibiotics: Unasyn x1 dose, cefepime x1 dose, ceftriaxone x1 dose, vancomycin x2 doses   Time spent: 25 minutes    Junious Silk ANP  [facility] 7 am - 330 pm/M-F for direct patient care and secure chat Please refer to Amion for contact info 30  days

## 2021-02-04 NOTE — Consult Note (Signed)
Brief Psychiatry Consult Note  The patient was last seen by the psychiatry service on 1/16 at which point we signed off. Interim documentation by primary team and nursing staff has been reviewed.   We were re-consulted to do a MOCA without another clinical question. Patient at time of consult had scored a 2 on the SLUMS, a 9 on the MMSE, and a 0 on two other brief cognitive screens; there is minimal additional utility to doing a MOCA at this time.   Please reconsult if there is another consult question.   Steve [name] A [employer]

## 2021-02-05 MED ORDER — TRAZODONE HCL 100 MG PO TABS
100.0000 mg | ORAL_TABLET | Freq: Every day | ORAL | Status: DC
  Administered 2021-02-05 – 2021-03-09 (×33): 100 mg via ORAL
  Filled 2021-02-05 (×34): qty 1

## 2021-02-05 MED ORDER — OLANZAPINE 2.5 MG PO TABS
7.5000 mg | ORAL_TABLET | Freq: Every day | ORAL | Status: DC
  Administered 2021-02-05 – 2021-02-16 (×12): 7.5 mg via ORAL
  Filled 2021-02-05 (×11): qty 3

## 2021-02-05 MED ORDER — LEVETIRACETAM 500 MG PO TABS
500.0000 mg | ORAL_TABLET | Freq: Two times a day (BID) | ORAL | Status: DC
  Administered 2021-02-05 – 2021-02-08 (×8): 500 mg via ORAL
  Filled 2021-02-05 (×8): qty 1

## 2021-02-05 MED ORDER — OLANZAPINE 2.5 MG PO TABS
2.5000 mg | ORAL_TABLET | Freq: Every day | ORAL | Status: DC
  Administered 2021-02-05 – 2021-02-08 (×4): 2.5 mg via ORAL
  Filled 2021-02-05 (×4): qty 1

## 2021-02-05 NOTE — Progress Notes (Signed)
[facility] PROGRESS NOTE  Terrilyn SaverJames Rahming QIO:[id number] DOB: 02-Jul-1965 DOA: 01/05/2021 PCP: No primary care provider on file.  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 male with history of major depression, anxiety who was in a group home for last 5 months. He was brought to the ED on 12/20 from a group home for convalescence, combativeness and altered sensorium.  Per collateral history, patient was not acting typical for last few days. In the ED, patient was confused, combative Labs showed AKI with creatinine of 3.27, rhabdomyolysis, abnormal LFTs, elevated WBC count at 23.7 and lactic acid level at 7.6. 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  IV fluid resuscitation started Patient was admitted to ICU. 12/21, underwent LP which showed staph in CSF, felt to be contaminant.  EEG did not show any seizures 12/24, HD catheter was placed but patient did not require HD as creatinine started to improve 12/31, with clinical improvement,  [facility] 1/18 Cognitive evaluations: 2 on the SLUMS, a 9 on the MMSE, and a 0 on medi-cog and short blessed test  Subjective: Patient alert and slightly more appropriate than yesterday.  Was able to participate in conversation.  Initially was not oriented to place but with cues was able to [facility] [place] [facility]  Did get the year correct after 2 tries.  During conversation about preadmission symptoms including treatment of alcohol withdrawal at sober living when questioned regarding medications used to treat withdrawal patient stated Klonopin and then was trying to remember any other medications and perseverated stating "dehydration" which was mentioned earlier during the conversation regarding his admission symptoms.  When asked if he felt like he was having difficulty with remembering he stated he was not sure.  Objective: Vitals:   02/05/21 0025 02/05/21 0411  BP: (!) 126/96 112/88  Pulse: 91 89  Resp: 17 17  Temp: 98.5 F (36.9 C) 98.2 F (36.8 C)  SpO2: 97% 99%    Intake/Output Summary (Last 24 hours) at 02/05/2021 0740 Last data filed at 02/05/2021 0700 Gross per 24 hour  Intake 200.18 ml  Output --  Net 200.18 ml   Filed Weights   02/02/21 0500 02/03/21 2357 02/04/21 0412  Weight: 60.5 kg 59.5 kg 59.5 kg    Exam:  Constitutional: NAD, restless but easily redirectable as long as personnel are at the bedside Respiratory: clear to auscultation bilaterally, no wheezing, no crackles. Normal respiratory effort. RA Cardiovascular: Regular pulse, no peripheral edema.  Normotensive.  S1-S2 Abdomen: no tenderness, no masses palpated. Bowel sounds positive. LBM 1/18 Neurologic: CN 2-12 grossly intact. Sensation intact,  Strength 3+-4/5 x all 4 extremities.  Psychiatric: Restless, requires telemetry sitter and frequent requests to have staff in the room due to unsafe behaviors that could lead to falling out of  bed.  Oriented to name and with cues to year and place but not to situation.   Assessment/Plan: Acute problems: Significant cognitive impairment 2/2 alcoholic dementia/history of EtOH abuse x30+ years Patient continues with flat affect and demonstrates reported paranoid type behaviors and is having difficulty cognitively with performing executive functioning skills-cognitive screening revealed significant cognitive deficits Continue high-dose thiamine IV x5 days-first dose 1/17  TSH and anemia panel within normal limits Continue Prozac, BuSpar, trazodone-has known history of major depressive disorder Continues to demonstrate impulsive behaviors and currently is on max dose of SSRI.  We will change Seroquel to Zyprexa to see if this helps.  If not, may need to initiate low-dose Depakote and follow LFTs closely Suspect this will be his new baseline noting no significant improvement in the 29 days he has been hospitalized  Left lower extremity pain, weakness. Work-up including imaging unremarkable Does have a history of smoking therefore could benefit from PVD evaluation with arterial duplex and ABI Appears to have resolved noting patient no longer complaining of pain   Seizure Generalized convulsion was noted by his roommate prior to presentation. No apparent prior history of seizure disorder noting patient was not on AEDs prior to admission Unclear if withdrawal seizure or related to other substance-UDS positive for benzodiazepines and was probably obtained after was administered this class of medications for acute seizure activity EEG unremarkable.  Neurology consult appreciated.  Continue Vimpat for now.  Was transitioned to this drug from Keppra given AKI which has now resolved.  Pharmacy to assist in transitioning from Vimpat to Keppra.   Acute renal failure secondary to rhabdomyolysis Acute metabolic acidosis His creatinine continue to worsen to peak at 7.48 on 12/26.  Nephrology  consult was obtained.  He was prepped for dialysis, had a HD catheter placed but never needed dialysis.  Creatinine gradually improved to normal.  Serum bicarb level improved as well. Nephrology consult appreciated   Rhabdomyolysis Essentially resolved with IV fluid Peak CK greater than 50,000 and has decreased to 376 as of 1/3   Ruled out upper extremity DVT Per critical care note, hemodialysis cath had a clot on it when it was removed.  Ultrasound duplex scan of upper extremities was obtained on 12/31 which did not show any evidence of DVT.  Because of his risk of falls, noncompliance, recurrent seizures, I would avoid long-term anticoagulation without definite evidence of DVT.     Aspiration pneumonia Completed a course of Zosyn   Acute liver injury His AST and ALT were significantly elevated to over 700s on admission.  Gradually improved to normal.   Evidence of right ventricular dysfunction and recent echocardiogram Echo 1/2 with EF 60 to 65%.  Cannot rule out a small PFO. Not clinically significant as patient has no symptoms related to it. Continue to monitor as an outpatient.       Scheduled Meds:  amLODipine  2.5 mg Oral Daily   aspirin  325 mg Oral Daily   Or   aspirin  300 mg Rectal Daily   busPIRone  10 mg Oral BID   docusate sodium  100 mg Oral BID   feeding supplement  237 mL Oral BID BM   FLUoxetine  40 mg Oral Daily   folic acid  1 mg Oral Daily   gabapentin  600 mg Oral TID   heparin injection (subcutaneous)  5,000 Units Subcutaneous Q8H   lacosamide  100 mg Oral BID   lidocaine  1 patch Transdermal Q24H   mouth rinse  15 mL Mouth Rinse BID   nicotine  21 mg Transdermal Daily   polyethylene glycol  17 g Oral Daily   QUEtiapine  100 mg Oral Daily   QUEtiapine  200 mg Oral QHS   sodium chloride flush  3 mL Intravenous Q12H   traZODone  50 mg Oral QHS   Continuous Infusions:  sodium chloride 10 mL/hr at 02/04/21 1457   thiamine injection Stopped  (02/05/21 0601)    Principal Problem:   Acute metabolic encephalopathy Active Problems:   Severe sepsis without septic shock ([name])   Fever   Pressure injury of skin   Alcoholic dementia ([name])   Wernicke's encephalopathy   Physical deconditioning   Right ventricular dysfunction   Consultants: Nephrology Psychiatry  Procedures: EEG Echocardiogram TDC  Antibiotics: Unasyn x1 dose, cefepime x1 dose, ceftriaxone x1 dose, vancomycin x2 doses   Time spent: 25 minutes    Junious Silk ANP  [facility] 7 am - 330 pm/M-F for direct patient care and secure chat Please refer to Amion for contact info 31  days

## 2021-02-05 NOTE — Progress Notes (Signed)
Physical Therapy Treatment Patient Details Name: Steve [name] MRN: [id number] DOB: 07-Mar-1965 Today's Date: 02/05/2021   History of Present Illness 56 y.o. male brought to the ED on 12/20 from sober living group home for combativeness and altered sensorium.  Per collateral history, patient was not acting typical for last few days.  In the ED, patient was confused, combative  Labs showed AKI with creatinine of 3.27, rhabdomyolysis, abnormal LFTs, elevated WBC count at 23.7 and lactic acid level at 7.6.  CT head negative for acute bleeding or infarct but showed prominent fluid space at the left cerebral pontine angle raising possibility of epidermoid arachnoid cyst  Blood culture was sent, empiric antibiotic started.  IV fluid resuscitation started and patient was admitted to ICU.  12/21, underwent LP which showed staph in CSF, felt to be contaminant.  EEG did not show any seizures  12/24, HD catheter was placed but patient did not require HD as creatinine started to improve  12/31, with clinical improvement, patient was transferred out of ICU to [facility] PMH: major depression, anxiety    PT Comments    Pt received in supine with bed alarm sounding, agreeable to therapy session with encouragement, appearing oriented to self only and telesitter in room. Emphasis on safe sequencing for bed mobility and transfers, standing balance and gait training with RW. Pt c/o left calf pain intermittently but able to progress giat in room up to 28ft prior to sitting and calf does not feel hot or swollen, RN notified. Pt continues to benefit from PT services to progress toward functional mobility goals.   Recommendations for follow up therapy are one component of a multi-disciplinary discharge planning process, led by the attending physician.  Recommendations may be updated based on patient status, additional functional criteria and insurance authorization.  Follow Up Recommendations  Skilled nursing-short term rehab (<3  hours/day)     Assistance Recommended at Discharge Frequent or constant Supervision/Assistance  Patient can return home with the following A little help with walking and/or transfers;A little help with bathing/dressing/bathroom;Assistance with cooking/housework;Direct supervision/assist for financial management;Assist for transportation;Help with stairs or ramp for entrance   Equipment Recommendations  Rolling walker (2 wheels)    Recommendations for Other Services       Precautions / Restrictions Precautions Precautions: Fall Precaution Comments: h/o L foot pain Restrictions Weight Bearing Restrictions: No     Mobility  Bed Mobility Overal bed mobility: Needs Assistance Bed Mobility: Supine to Sit, Sit to Supine     Supine to sit: Max assist Sit to supine: Mod assist   General bed mobility comments: for cues and physical assist, pt unable to initially sequence properly to pull himself up therefore requiring heavy assist.    Transfers Overall transfer level: Needs assistance Equipment used: Rolling walker (2 wheels) Transfers: Sit to/from Stand Sit to Stand: Min assist           General transfer comment: from EOB<>RW x3 trials, increased assist for sit>stand and fair eccentric control with sitting, needs max multimodal cues for safety/sequencing due to pt internal distraction and cognitive deficit.    Ambulation/Gait Ambulation/Gait assistance: Min assist, Mod assist Gait Distance (Feet): 50 Feet (66ft, 52ft with seated break between) Assistive device: Rolling walker (2 wheels) Gait Pattern/deviations: Step-through pattern       General Gait Details: Pt with step through pattern requiring mod cues and min A to direct RW, x2 episodes loss of balance requiring modA to correct, pt initially more antalgic with left foot placement but  improved gait pattern and comfort after standing longer   Stairs             Wheelchair Mobility    Modified Rankin  (Stroke Patients Only)       Balance Overall balance assessment: Needs assistance Sitting-balance support: Feet supported Sitting balance-Leahy Scale: Fair Sitting balance - Comments: impulsively leaning at times but appears fair with 0-1 UE support   Standing balance support: Bilateral upper extremity supported, Reliant on assistive device for balance Standing balance-Leahy Scale: Poor Standing balance comment: reliant on therapist and RW, LOB x2                            Cognition Arousal/Alertness: Awake/alert Behavior During Therapy: Restless, Anxious Overall Cognitive Status: Impaired/Different from baseline                                 General Comments: Oriented to self only but following some 1-step mobility commands with multimodal cues and increased time. Pt c/o calf pain today although pain initially appeared to be in pt foot.        Exercises Other Exercises Other Exercises: seated BLE AAROM: hip flexion, LAQ x10 reps ea Other Exercises: STS x4 trials    General Comments General comments (skin integrity, edema, etc.): VSS per chart review, no acute s/sx distress other than left leg pain which per chart review is chronic      Pertinent Vitals/Pain Pain Assessment Pain Assessment: Faces Faces Pain Scale: Hurts even more Pain Location: L calf, although pt also appears to have discomfort in foot initially Pain Descriptors / Indicators: Guarding, Grimacing Pain Intervention(s): Monitored during session, Repositioned     PT Goals (current goals can now be found in the care plan section) Acute Rehab PT Goals Patient Stated Goal: None stated PT Goal Formulation: With patient Time For Goal Achievement: 02/19/21 Progress towards PT goals: Progressing toward goals    Frequency    Min 2X/week      PT Plan Current plan remains appropriate       AM-PAC PT "6 Clicks" Mobility   Outcome Measure  Help needed turning from your  back to your side while in a flat bed without using bedrails?: None Help needed moving from lying on your back to sitting on the side of a flat bed without using bedrails?: A Lot Help needed moving to and from a bed to a chair (including a wheelchair)?: A Lot Help needed standing up from a chair using your arms (e.g., wheelchair or bedside chair)?: A Lot Help needed to walk in hospital room?: A Lot Help needed climbing 3-5 steps with a railing? : Total 6 Click Score: 13    End of Session Equipment Utilized During Treatment: Gait belt Activity Tolerance: Patient tolerated treatment well Patient left: in bed;with call bell/phone within reach;with bed alarm set;Other (comment);with nursing/sitter in room (telesitter, 4 rails up for safety due to cognitive deficit/AMS) Nurse Communication: Mobility status PT Visit Diagnosis: Unsteadiness on feet (R26.81);Pain Pain - Right/Left: Left Pain - part of body: Ankle and joints of foot     Time: [id number] PT Time Calculation (min) (ACUTE ONLY): 19 min  Charges:  $Gait Training: 8-22 mins                     Cyncere Sontag P., PTA Acute Rehabilitation Services Pager: [phone] Office: [phone]  Dorathy Kinsman Delanie Tirrell 02/05/2021, 4:50 PM

## 2021-02-05 NOTE — TOC Progression Note (Signed)
Transition of Care [facility]) - Progression Note    Patient Details  Name: Steve [name] MRN: [id number] Date of Birth: [birth year]  Transition of Care [facility] CM/SW Contact  Janae Bridgeman, RN Phone Number: 02/05/2021, 9:26 AM  Clinical Narrative:    CM spoke with Clement Husbands through email and Financial Counseling is requesting driver's license and social security information for the patient to assist with Medicaid and financial counseling screening.  I called and spoke with [facility] representative and will call back this morning at 1015 to speak with the director at [facility] at [phone] to assist with obtaining patient's belongings and/or copy of driver's license information from the facility.  CM and MSW will continue to follow the patient for TOC needs.   Expected Discharge Plan:  (To be determined) Barriers to Discharge: Continued Medical Work up, Homeless with medical needs, Inadequate or no insurance  Expected Discharge Plan and Services Expected Discharge Plan:  (To be determined) In-house Referral: PCP / [facility] Discharge Planning Services: CM Consult, [facility] Program, Medication Assistance, Follow-up appt scheduled, [facility]igent [facility]   Living arrangements for the past 2 months: Group Home (Patient was resident at [facility] prior to hospital admission.) Expected Discharge Date: 01/24/21                                     Social Determinants of Health (SDOH) Interventions    Readmission Risk Interventions No flowsheet data found.

## 2021-02-06 LAB — COMPREHENSIVE METABOLIC PANEL
ALT: 15 U/L (ref 0–44)
AST: 20 U/L (ref 15–41)
Albumin: 4.2 g/dL (ref 3.5–5.0)
Alkaline Phosphatase: 45 U/L (ref 38–126)
Anion gap: 10 (ref 5–15)
BUN: 19 mg/dL (ref 6–20)
CO2: 24 mmol/L (ref 22–32)
Calcium: 9.8 mg/dL (ref 8.9–10.3)
Chloride: 102 mmol/L (ref 98–111)
Creatinine, Ser: 0.85 mg/dL (ref 0.61–1.24)
GFR, Estimated: >60 mL/min (ref >60)
Glucose, Bld: 100 mg/dL — ABNORMAL HIGH (ref 70–99)
Potassium: 3.6 mmol/L (ref 3.5–5.1)
Sodium: 136 mmol/L (ref 135–145)
Total Bilirubin: 0.5 mg/dL (ref 0.3–1.2)
Total Protein: 7.9 g/dL (ref 6.5–8.1)

## 2021-02-06 LAB — CBC WITH DIFFERENTIAL/PLATELET
Abs Immature Granulocytes: 0.01 10*3/uL (ref 0.00–0.07)
Basophils Absolute: 0.1 10*3/uL (ref 0.0–0.1)
Basophils Relative: 1 %
Eosinophils Absolute: 0.2 10*3/uL (ref 0.0–0.5)
Eosinophils Relative: 4 %
HCT: 36.1 % — ABNORMAL LOW (ref 39.0–52.0)
Hemoglobin: 12.8 g/dL — ABNORMAL LOW (ref 13.0–17.0)
Immature Granulocytes: 0 %
Lymphocytes Relative: 57 %
Lymphs Abs: 3.4 10*3/uL (ref 0.7–4.0)
MCH: 32.8 pg (ref 26.0–34.0)
MCHC: 35.5 g/dL (ref 30.0–36.0)
MCV: 92.6 fL (ref 80.0–100.0)
Monocytes Absolute: 0.6 10*3/uL (ref 0.1–1.0)
Monocytes Relative: 11 %
Neutro Abs: 1.6 10*3/uL — ABNORMAL LOW (ref 1.7–7.7)
Neutrophils Relative %: 27 %
Platelets: 368 10*3/uL (ref 150–400)
RBC: 3.9 MIL/uL — ABNORMAL LOW (ref 4.22–5.81)
RDW: 12.4 % (ref 11.5–15.5)
WBC: 5.9 10*3/uL (ref 4.0–10.5)
nRBC: 0 % (ref 0.0–0.2)

## 2021-02-06 NOTE — Progress Notes (Signed)
Enclosure bed ordered for patient but none available in the hospital at this time. Will try portable equipment again today.

## 2021-02-06 NOTE — Progress Notes (Signed)
[place]y bed available for the patient. Patient continues to exhibit unsafe behavior pulling at lines, IV pump, and getting tangled in call bell wires. Tele-sitter does help with some redirection but patient still not aware of safety limitations. Will start enclosure bed and assess the need to continue. MD notified that bed has arrived to unit and trial of patient in bed

## 2021-02-06 NOTE — Progress Notes (Signed)
[facility] PROGRESS NOTE  Steve [name] QJF:[id number] DOB: 01/22/1965 DOA: 01/05/2021 PCP: No primary care provider on file.  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 male with history of major depression, anxiety who was in a group home for last 5 months. He was brought to the ED on 12/20 from a group home for convalescence, combativeness and altered sensorium.  Per collateral history, patient was not acting typical for last few days. In the ED, patient was confused, combative Labs showed AKI with creatinine of 3.27, rhabdomyolysis, abnormal LFTs, elevated WBC count at 23.7 and lactic acid level at 7.6. 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  IV fluid resuscitation started Patient was admitted to ICU. 12/21, underwent LP which showed staph in CSF, felt to be contaminant.  EEG did not show any seizures 12/24, HD catheter was placed but patient did not require HD as creatinine started to improve 12/31, with clinical improvement,  patient was transferred out of ICU to [facility]. 1/18 Cognitive evaluations: 2 on the SLUMS, a 9 on the MMSE, and a 0 on medi-cog and short blessed test  Subjective: Patient alert and slightly more appropriate than yesterday.  Was able to participate in conversation.  Initially was not oriented to place but with cues was able to state that he was at [facility] in [place] with the hospital.  Did get the year correct after 2 tries.  During conversation about preadmission symptoms including treatment of alcohol withdrawal at sober living when questioned regarding medications used to treat withdrawal patient stated Klonopin and then was trying to remember any other medications and perseverated stating "dehydration" which was mentioned earlier during the conversation regarding his admission symptoms.  When asked if he felt like he was having difficulty with remembering he stated he was not sure.  02/06/2021: Seen at bedside.  No new complaints.  Objective: Vitals:   02/06/21 0825 02/06/21 1156  BP: (!) 131/100 114/70  Pulse: 73 71  Resp: 18 18  Temp: 97.6 F (36.4 C) 97.6 F (36.4 C)  SpO2: 99%     Intake/Output Summary (Last 24 hours) at 02/06/2021 1318 Last data filed at 02/06/2021 0900 Gross per 24 hour  Intake 490 ml  Output --  Net 490 ml   Filed Weights   02/02/21 0500 02/03/21 2357 02/04/21 0412  Weight: 60.5 kg 59.5 kg 59.5 kg    Exam:  Constitutional: NAD, restless but easily redirectable as long as personnel are at the bedside Respiratory: clear to auscultation bilaterally, no wheezing, no crackles. Normal respiratory effort. RA Cardiovascular: Regular pulse, no peripheral edema.  Normotensive.  S1-S2 Abdomen: no tenderness, no masses palpated. Bowel sounds positive.  LBM 1/18 Neurologic: CN 2-12 grossly intact. Sensation intact, Strength 3+-4/5 x all 4 extremities.  Psychiatric: Restless, requires telemetry sitter and frequent requests to have staff in the room due to unsafe  behaviors that could lead to falling out of bed.  Oriented to name and with cues to year and place but not to situation.   Assessment/Plan: Acute problems: Significant cognitive impairment 2/2 alcoholic dementia/history of EtOH abuse x30+ years Patient continues with flat affect and demonstrates reported paranoid type behaviors and is having difficulty cognitively with performing executive functioning skills-cognitive screening revealed significant cognitive deficits Continue high-dose thiamine IV x5 days-first dose 1/17  TSH and anemia panel within normal limits Continue Prozac, BuSpar, trazodone-has known history of major depressive disorder Continues to demonstrate impulsive behaviors and currently is on max dose of SSRI.  We will change Seroquel to Zyprexa to see if this helps.  If not, may need to initiate low-dose Depakote and follow LFTs closely Suspect this will be his new baseline noting no significant improvement in the 29 days he has been hospitalized  Left lower extremity pain, weakness. Work-up including imaging unremarkable Does have a history of smoking therefore could benefit from PVD evaluation with arterial duplex and ABI Appears to have resolved noting patient no longer complaining of pain   Seizure Generalized convulsion was noted by his roommate prior to presentation. No apparent prior history of seizure disorder noting patient was not on AEDs prior to admission Unclear if withdrawal seizure or related to other substance-UDS positive for benzodiazepines and was probably obtained after was administered this class of medications for acute seizure activity EEG unremarkable.  Neurology consult appreciated.  Continue Vimpat for now.  Was transitioned to this drug from Keppra given AKI which has now resolved.  Pharmacy to assist in transitioning from Vimpat to Keppra.   Acute renal failure secondary to rhabdomyolysis Acute metabolic acidosis His creatinine continue to worsen  to peak at 7.48 on 12/26.  Nephrology consult was obtained.  He was prepped for dialysis, had a HD catheter placed but never needed dialysis.  Creatinine gradually improved to normal.  Serum bicarb level improved as well. Nephrology consult appreciated   Rhabdomyolysis Essentially resolved with IV fluid Peak CK greater than 50,000 and has decreased to 376 as of 1/3   Ruled out upper extremity DVT Per critical care note, hemodialysis cath had a clot on it when it was removed.  Ultrasound duplex scan of upper extremities was obtained on 12/31 which did not show any evidence of DVT.  Because of his risk of falls, noncompliance, recurrent seizures, I would avoid long-term anticoagulation without definite evidence of DVT.     Aspiration pneumonia Completed a course of Zosyn   Acute liver injury His AST and ALT were significantly elevated to over 700s on admission.  Gradually improved to normal.   Evidence of right ventricular dysfunction and recent echocardiogram Echo 1/2 with EF 60 to 65%.  Cannot rule out a small PFO. Not clinically significant as patient has no symptoms related to it. Continue to monitor as an outpatient.       Scheduled Meds:  amLODipine  2.5 mg Oral Daily   aspirin  325 mg Oral Daily   Or   aspirin  300 mg Rectal Daily   busPIRone  10 mg Oral BID   docusate sodium  100 mg Oral BID   feeding supplement  237 mL Oral BID BM   FLUoxetine  40 mg Oral Daily   folic acid  1 mg Oral Daily   gabapentin  600 mg Oral TID   heparin injection (subcutaneous)  5,000 Units Subcutaneous Q8H   levETIRAcetam  500 mg Oral BID   lidocaine  1 patch Transdermal Q24H   mouth rinse  15 mL Mouth Rinse BID   nicotine  21 mg Transdermal Daily   OLANZapine  2.5 mg Oral Daily   OLANZapine  7.5 mg Oral QHS   polyethylene glycol  17 g Oral Daily   sodium chloride flush  3 mL Intravenous Q12H   traZODone  100 mg Oral QHS   Continuous Infusions:  sodium chloride 10 mL/hr at 02/04/21  1457   thiamine injection 500 mg (02/06/21 1313)    Principal Problem:   Acute metabolic encephalopathy Active Problems:   Severe sepsis without septic shock ([name])   Fever   Pressure injury of skin   Alcoholic dementia ([name])   Wernicke's encephalopathy   Physical deconditioning   Right ventricular dysfunction   Consultants: Nephrology Psychiatry  Procedures: EEG Echocardiogram TDC  Antibiotics: Unasyn x1 dose, cefepime x1 dose, ceftriaxone x1 dose, vancomycin x2 doses   Time spent: 25 minutes    Darlin Drop ANP  [facility] 7 am - 330 pm/M-F for direct patient care and secure chat Please refer to Amion for contact info 32  days

## 2021-02-07 MED ORDER — THIAMINE HCL 100 MG PO TABS
250.0000 mg | ORAL_TABLET | Freq: Every day | ORAL | Status: AC
Start: 2021-02-08 — End: 2021-02-12
  Administered 2021-02-08 – 2021-02-12 (×5): 250 mg via ORAL
  Filled 2021-02-07 (×5): qty 3

## 2021-02-07 NOTE — Progress Notes (Signed)
[facility] PROGRESS NOTE  Steve [name] JAS:[id number] DOB: 08/25/1965 DOA: 01/05/2021 PCP: No primary care provider on file.  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 male with history of major depression, anxiety who was in a group home for last 5 months. He was brought to the ED on 12/20 from a group home for convalescence, combativeness and altered sensorium.  Per collateral history, patient was not acting typical for last few days. In the ED, patient was confused, combative Labs showed AKI with creatinine of 3.27, rhabdomyolysis, abnormal LFTs, elevated WBC count at 23.7 and lactic acid level at 7.6. 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  IV fluid resuscitation started Patient was admitted to ICU. 12/21, underwent LP which showed staph in CSF, felt to be contaminant.  EEG did not show any seizures 12/24, HD catheter was placed but patient did not require HD as creatinine started to improve 12/31, with clinical improvement,  patient was transferred out of ICU to [facility]. 1/18 Cognitive evaluations: 2 on the SLUMS, a 9 on the MMSE, and a 0 on medi-cog and short blessed test  Subjective: Patient alert and slightly more appropriate than yesterday.  Was able to participate in conversation.  Initially was not oriented to place but with cues was able to state that he was at [facility] in [place] with the hospital.  Did get the year correct after 2 tries.  During conversation about preadmission symptoms including treatment of alcohol withdrawal at sober living when questioned regarding medications used to treat withdrawal patient stated Klonopin and then was trying to remember any other medications and perseverated stating "dehydration" which was mentioned earlier during the conversation regarding his admission symptoms.  When asked if he felt like he was having difficulty with remembering he stated he was not sure.  02/07/2021: Patient was seen at bedside.  He was resting peacefully in the [place] bed.  Objective: Vitals:   02/07/21 0407 02/07/21 0804  BP: (!) 124/97 113/82  Pulse: 82 84  Resp: 18 18  Temp: 98 F (36.7 C) 98.2 F (36.8 C)  SpO2: 100% 100%    Intake/Output Summary (Last 24 hours) at 02/07/2021 1426 Last data filed at 02/07/2021 1100 Gross per 24 hour  Intake 393 ml  Output 1100 ml  Net -707 ml   Filed Weights   02/02/21 0500 02/03/21 2357 02/04/21 0412  Weight: 60.5 kg 59.5 kg 59.5 kg    Exam: No significant changes from prior exam.  Constitutional: NAD Respiratory: clear to auscultation bilaterally, no wheezing, no crackles. Normal respiratory effort. RA Cardiovascular: Regular pulse, no peripheral edema.  Normotensive.  S1-S2 Abdomen: no tenderness, no masses palpated. Bowel  sounds positive. LBM 1/18 Neurologic: CN 2-12 grossly intact. Sensation intact, Strength 3+-4/5 x all 4 extremities.  Psychiatric: Unable to assess due to somnolence.   Assessment/Plan: Acute problems: Significant  cognitive impairment 2/2 alcoholic dementia/history of EtOH abuse x30+ years Patient continues with flat affect and demonstrates reported paranoid type behaviors and is having difficulty cognitively with performing executive functioning skills-cognitive screening revealed significant cognitive deficits Continue high-dose thiamine IV x5 days-first dose 1/17  TSH and anemia panel within normal limits Continue Prozac, BuSpar, trazodone-has known history of major depressive disorder Continues to demonstrate impulsive behaviors and currently is on max dose of SSRI.  We will change Seroquel to Zyprexa to see if this helps.  If not, may need to initiate low-dose Depakote and follow LFTs closely Suspect this will be his new baseline noting no significant improvement in the 29 days he has been hospitalized  Left lower extremity pain, weakness. Work-up including imaging unremarkable Does have a history of smoking therefore could benefit from PVD evaluation with arterial duplex and ABI Appears to have resolved noting patient no longer complaining of pain   Seizure Generalized convulsion was noted by his roommate prior to presentation. No apparent prior history of seizure disorder noting patient was not on AEDs prior to admission Unclear if withdrawal seizure or related to other substance-UDS positive for benzodiazepines and was probably obtained after was administered this class of medications for acute seizure activity EEG unremarkable.  Neurology consult appreciated.  Continue Vimpat for now.  Was transitioned to this drug from Keppra given AKI which has now resolved.  Pharmacy to assist in transitioning from Vimpat to Keppra.   Acute renal failure secondary to rhabdomyolysis Acute metabolic acidosis His creatinine continue to worsen to peak at 7.48 on 12/26.  Nephrology consult was obtained.  He was prepped for dialysis, had a HD catheter placed but never needed dialysis.  Creatinine gradually  improved to normal.  Serum bicarb level improved as well. Nephrology consult appreciated   Rhabdomyolysis Essentially resolved with IV fluid Peak CK greater than 50,000 and has decreased to 376 as of 1/3   Ruled out upper extremity DVT Per critical care note, hemodialysis cath had a clot on it when it was removed.  Ultrasound duplex scan of upper extremities was obtained on 12/31 which did not show any evidence of DVT.  Because of his risk of falls, noncompliance, recurrent seizures, I would avoid long-term anticoagulation without definite evidence of DVT.     Aspiration pneumonia Completed a course of Zosyn   Acute liver injury His AST and ALT were significantly elevated to over 700s on admission.  Gradually improved to normal.   Evidence of right ventricular dysfunction and recent echocardiogram Echo 1/2 with EF 60 to 65%.  Cannot rule out a small PFO. Not clinically significant as patient has no symptoms related to it. Continue to monitor as an outpatient.       Scheduled Meds:  amLODipine  2.5 mg Oral Daily   aspirin  325 mg Oral Daily   Or   aspirin  300 mg Rectal Daily   busPIRone  10 mg Oral BID   docusate sodium  100 mg Oral BID   feeding supplement  237 mL Oral BID BM   FLUoxetine  40 mg Oral Daily   folic acid  1 mg Oral Daily   gabapentin  600 mg Oral TID   heparin injection (subcutaneous)  5,000 Units Subcutaneous Q8H   levETIRAcetam  500 mg Oral BID  lidocaine  1 patch Transdermal Q24H   mouth rinse  15 mL Mouth Rinse BID   nicotine  21 mg Transdermal Daily   OLANZapine  2.5 mg Oral Daily   OLANZapine  7.5 mg Oral QHS   polyethylene glycol  17 g Oral Daily   sodium chloride flush  3 mL Intravenous Q12H   [START ON 02/08/2021] thiamine  250 mg Oral Daily   traZODone  100 mg Oral QHS   Continuous Infusions:  sodium chloride 10 mL/hr at 02/04/21 1457   thiamine injection 500 mg (02/07/21 0530)    Principal Problem:   Acute metabolic  encephalopathy Active Problems:   Severe sepsis without septic shock ([name])   Fever   Pressure injury of skin   Alcoholic dementia ([name])   Wernicke's encephalopathy   Physical deconditioning   Right ventricular dysfunction   Consultants: Nephrology Psychiatry  Procedures: EEG Echocardiogram TDC  Antibiotics: Unasyn x1 dose, cefepime x1 dose, ceftriaxone x1 dose, vancomycin x2 doses   Time spent: 25 minutes    Darlin Drop ANP  [facility] 7 am - 330 pm/M-F for direct patient care and secure chat Please refer to Amion for contact info 33  days

## 2021-02-07 NOTE — Progress Notes (Signed)
Patient continues to exhibit unsafe behavior and remains in the posey bed. Patient with repeated removal of his external catheter, incontinent BM episode in the bed and when ambulated to the bathroom patient shows little awareness of surroundings, decreased following of commands. Seems appropriate to continue bed restraint and telesitter

## 2021-02-08 MED ORDER — VALPROIC ACID 250 MG PO CAPS
250.0000 mg | ORAL_CAPSULE | Freq: Two times a day (BID) | ORAL | Status: DC
  Administered 2021-02-08 – 2021-02-10 (×6): 250 mg via ORAL
  Filled 2021-02-08 (×7): qty 1

## 2021-02-08 MED ORDER — OLANZAPINE 2.5 MG PO TABS
5.0000 mg | ORAL_TABLET | Freq: Every day | ORAL | Status: DC
  Administered 2021-02-09 – 2021-02-16 (×8): 5 mg via ORAL
  Filled 2021-02-08 (×9): qty 2

## 2021-02-08 NOTE — Progress Notes (Signed)
CSW spoke with Casimiro Needle [phone]) from [facility] [place] to request his assistance in obtaining copies of the patient's drivers license and SS card. Casimiro Needle reports having the patient's SS care but will have to search for his license. Casimiro Needle will send CSW a secure e-mail with documents attached once located.  Edwin Dada, MSW, LCSW Transitions of Care   Clinical Social Worker II [phone]

## 2021-02-08 NOTE — Plan of Care (Signed)

## 2021-02-08 NOTE — Progress Notes (Signed)
[facility] PROGRESS NOTE  Steve [name] GYF:[id number] DOB: 16-Sep-1965 DOA: 01/05/2021 PCP: No primary care provider on file.  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 male with history of major depression, anxiety who was in a group home for last 5 months. He was brought to the ED on 12/20 from a group home for convalescence, combativeness and altered sensorium.  Per collateral history, patient was not acting typical for last few days. In the ED, patient was confused, combative Labs showed AKI with creatinine of 3.27, rhabdomyolysis, abnormal LFTs, elevated WBC count at 23.7 and lactic acid level at 7.6. 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  IV fluid resuscitation started Patient was admitted to ICU. 12/21, underwent LP which showed staph in CSF, felt to be contaminant.  EEG did not show any seizures 12/24, HD catheter was placed but patient did not require HD as creatinine started to improve 12/31, with clinical improvement,  patient was transferred out of ICU to [facility]. 1/18 Cognitive evaluations: 2 on the SLUMS, a 9 on the MMSE, and a 0 on medi-cog and short blessed test  Subjective: Patient in [place] bed enclosure.  Upon entry into room I found patient had removed his dressing and was chewing on it.  When I questioned him about this he told me I could have that and I obtained the dressing and threw it in the trash and updated nursing staff.  Very confused.  Required frequent redirection in regards to not pulling off condom catheter.  Objective: Vitals:   02/07/21 2323 02/08/21 0351  BP: 120/90 (!) 137/92  Pulse: 90 87  Resp: 17 19  Temp: 98.4 F (36.9 C) 97.6 F (36.4 C)  SpO2: 100% 100%    Intake/Output Summary (Last 24 hours) at 02/08/2021 0748 Last data filed at 02/07/2021 2219 Gross per 24 hour  Intake 593 ml  Output 400 ml  Net 193 ml   Filed Weights   02/02/21 0500 02/03/21 2357 02/04/21 0412  Weight: 60.5 kg 59.5 kg 59.5 kg    Exam:  Constitutional: NAD, restless Respiratory: clear to auscultation bilaterally, no wheezing, no crackles. Normal respiratory effort. RA Cardiovascular: Regular pulse, no peripheral edema.  Normotensive.  S1-S2 Abdomen: no tenderness, no masses palpated. Bowel sounds positive. LBM 1/22 Neurologic: CN 2-12 grossly intact. Sensation intact, Strength 3+-4/5 x all 4 extremities.  Psychiatric: Alert and oriented to name only.  Not to year, place or situation.  Requires frequent redirection and for safety concerns has been placed in a [place] enclosure bed to prevent falls and self injury   Assessment/Plan: Acute problems: Significant cognitive impairment 2/2 alcoholic dementia/history of EtOH abuse x30+ years Patient continues with  flat affect and demonstrates reported paranoid type behaviors and is having difficulty cognitively with performing executive functioning skills-cognitive screening revealed significant cognitive deficits Completed 5 days of high-dose IV  thiamine 3 times daily  TSH and anemia panel within normal limits Continue Prozac, BuSpar, trazodone-has known history of major depressive disorder Seroquel ineffective therefore transition to Zyprexa.  Will increase daytime dose to 5 mg on 1/23 Will add low-dose twice daily Depakote for impulsive behaviors and titrate as tolerates.  We will need to follow LFTs and platelets Suspect this will be his new baseline noting no significant improvement in the 29 days he has been hospitalized  Left lower extremity pain, weakness. Work-up including imaging unremarkable Does have a history of smoking therefore could benefit from PVD evaluation with arterial duplex and ABI Appears to have resolved noting patient no longer complaining of pain   Seizure Generalized convulsion was noted by his roommate prior to presentation. No apparent prior history of seizure disorder noting patient was not on AEDs prior to admission Unclear if withdrawal seizure or related to other substance-UDS positive for benzodiazepines and was probably obtained after was administered this class of medications for acute seizure activity EEG unremarkable.  Neurology consult appreciated.  Vimpat which was utilized during AKI transition back to Keppra on 1/20   Acute renal failure secondary to rhabdomyolysis Acute metabolic acidosis His creatinine continue to worsen to peak at 7.48 on 12/26.  Nephrology consult was obtained.  He was prepped for dialysis, had a HD catheter placed but never needed dialysis.  Creatinine gradually improved to normal.  Serum bicarb level improved as well. Nephrology consult appreciated   Rhabdomyolysis Essentially resolved with IV fluid Peak CK greater than 50,000 and has decreased to 376 as of 1/3   Ruled out upper extremity DVT Per critical care note, hemodialysis cath had a clot on it when it was removed.  Ultrasound duplex scan of upper extremities was obtained on 12/31 which did not show any  evidence of DVT.  Because of his risk of falls, noncompliance, recurrent seizures, I would avoid long-term anticoagulation without definite evidence of DVT.     Aspiration pneumonia Completed a course of Zosyn   Acute liver injury His AST and ALT were significantly elevated to over 700s on admission.  Gradually improved to normal.   Evidence of right ventricular dysfunction and recent echocardiogram Echo 1/2 with EF 60 to 65%.  Cannot rule out a small PFO. Not clinically significant as patient has no symptoms related to it. Continue to monitor as an outpatient.       Scheduled Meds:  amLODipine  2.5 mg Oral Daily   aspirin  325 mg Oral Daily   Or   aspirin  300 mg Rectal Daily   busPIRone  10 mg Oral BID   docusate sodium  100 mg Oral BID   feeding supplement  237 mL Oral BID BM   FLUoxetine  40 mg Oral Daily   folic acid  1 mg Oral Daily   gabapentin  600 mg Oral TID   heparin injection (subcutaneous)  5,000 Units Subcutaneous Q8H   levETIRAcetam  500 mg Oral BID   lidocaine  1 patch Transdermal Q24H   mouth rinse  15 mL Mouth Rinse BID   nicotine  21 mg Transdermal Daily   OLANZapine  2.5 mg Oral Daily   OLANZapine  7.5 mg Oral QHS   polyethylene glycol  17 g Oral Daily   sodium chloride flush  3 mL  Intravenous Q12H   thiamine  250 mg Oral Daily   traZODone  100 mg Oral QHS   Continuous Infusions:  sodium chloride 10 mL/hr at 02/04/21 1457    Principal Problem:   Acute metabolic encephalopathy Active Problems:   Severe sepsis without septic shock ([name])   Fever   Pressure injury of skin   Alcoholic dementia ([name])   Wernicke's encephalopathy   Physical deconditioning   Right ventricular dysfunction   Consultants: Nephrology Psychiatry  Procedures: EEG Echocardiogram TDC  Antibiotics: Unasyn x1 dose, cefepime x1 dose, ceftriaxone x1 dose, vancomycin x2 doses   Time spent: 25 minutes    Junious SilkAllison Maurico Perrell ANP  [facility] 7 am - 330 pm/M-F  for direct patient care and secure chat Please refer to Amion for contact info 34  days

## 2021-02-09 MED ORDER — LEVETIRACETAM 100 MG/ML PO SOLN
500.0000 mg | Freq: Two times a day (BID) | ORAL | Status: DC
  Administered 2021-02-09 – 2021-03-30 (×99): 500 mg via ORAL
  Filled 2021-02-09 (×100): qty 5

## 2021-02-09 NOTE — Progress Notes (Signed)
[facility] PROGRESS NOTE  Steve [name] ERD:[id number] DOB: 02/20/1965 DOA: 01/05/2021 PCP: No primary care provider on file.  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 male with history of major depression, anxiety who was in a group home for last 5 months. He was brought to the ED on 12/20 from a group home for convalescence, combativeness and altered sensorium.  Per collateral history, patient was not acting typical for last few days. In the ED, patient was confused, combative Labs showed AKI with creatinine of 3.27, rhabdomyolysis, abnormal LFTs, elevated WBC count at 23.7 and lactic acid level at 7.6. 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  IV fluid resuscitation started Patient was admitted to ICU. 12/21, underwent LP which showed staph in CSF, felt to be contaminant.  EEG did not show any seizures 12/24, HD catheter was placed but patient did not require HD as creatinine started to improve 12/31, with clinical improvement,  patient was transferred out of ICU to [facility]. 1/18 Cognitive evaluations: 2 on the SLUMS, a 9 on the MMSE, and a 0 on medi-cog and short blessed test  Subjective: Patient remains confused and per my evaluation and per discussion with staff much less impulsive  Objective: Vitals:   02/09/21 0344 02/09/21 0754  BP: 128/89 (!) 128/92  Pulse: 93 95  Resp: 16 17  Temp: 97.9 F (36.6 C) 98 F (36.7 C)  SpO2:  100%    Intake/Output Summary (Last 24 hours) at 02/09/2021 0758 Last data filed at 02/08/2021 1826 Gross per 24 hour  Intake --  Output 650 ml  Net -650 ml   Filed Weights   02/02/21 0500 02/03/21 2357 02/04/21 0412  Weight: 60.5 kg 59.5 kg 59.5 kg    Exam:  Constitutional: NAD, restless Respiratory: clear to auscultation bilaterally, no wheezing, no crackles. Normal respiratory effort. RA Cardiovascular: Regular pulse, no peripheral edema.  Normotensive.  S1-S2 Abdomen: no tenderness, no masses palpated. Bowel sounds positive. LBM 1/22 Neurologic: CN 2-12 grossly intact. Sensation intact, Strength 3+-4/5 x all 4 extremities.  Psychiatric: Alert, oriented to name only.  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   Assessment/Plan: Acute problems: Significant cognitive impairment 2/2 alcoholic dementia/history of EtOH abuse x30+ years Patient continues with flat affect and demonstrates reported paranoid type behaviors and is having difficulty cognitively with performing executive functioning skills-cognitive screening revealed significant cognitive deficits Completed 5 days of high-dose IV thiamine 3 times daily  TSH and anemia panel within normal limits Continue Prozac, BuSpar, trazodone-has known history of major depressive disorder Seroquel ineffective - transitioned to Zyprexa Continue Depakote.  LFTs normal on 1/21 and platelets  normal at 368 Suspect this will be his new baseline noting no significant improvement in the 29 days he has  been hospitalized  Left lower extremity pain, weakness. Work-up including imaging unremarkable Does have a history of smoking therefore could benefit from PVD evaluation with arterial duplex and ABI Appears to have resolved noting patient no longer complaining of pain   Seizure Generalized convulsion was noted by his roommate prior to presentation. No apparent prior history of seizure disorder noting patient was not on AEDs prior to admission Unclear if withdrawal seizure or related to other substance-UDS positive for benzodiazepines and was probably obtained after was administered this class of medications for acute seizure activity EEG unremarkable.  Neurology consult appreciated.  Vimpat which was utilized during AKI transition back to Keppra on 1/20   Acute renal failure secondary to rhabdomyolysis Acute metabolic acidosis His creatinine continue to worsen to peak at 7.48 on 12/26.  Nephrology consult was obtained.  He was prepped for dialysis, had a HD catheter placed but never needed dialysis.  Creatinine gradually improved to normal.  Serum bicarb level improved as well. Nephrology consult appreciated   Rhabdomyolysis Essentially resolved with IV fluid Peak CK greater than 50,000 and has decreased to 376 as of 1/3   Ruled out upper extremity DVT Per critical care note, hemodialysis cath had a clot on it when it was removed.  Ultrasound duplex scan of upper extremities was obtained on 12/31 which did not show any evidence of DVT.  Because of his risk of falls, noncompliance, recurrent seizures, I would avoid long-term anticoagulation without definite evidence of DVT.     Aspiration pneumonia Completed a course of Zosyn   Acute liver injury His AST and ALT were significantly elevated to over 700s on admission.  Gradually improved to normal.   Evidence of right ventricular dysfunction and recent echocardiogram Echo 1/2 with EF 60 to 65%.  Cannot rule out a small PFO. Not  clinically significant as patient has no symptoms related to it. Continue to monitor as an outpatient.       Scheduled Meds:  amLODipine  2.5 mg Oral Daily   aspirin  325 mg Oral Daily   Or   aspirin  300 mg Rectal Daily   busPIRone  10 mg Oral BID   docusate sodium  100 mg Oral BID   feeding supplement  237 mL Oral BID BM   FLUoxetine  40 mg Oral Daily   folic acid  1 mg Oral Daily   gabapentin  600 mg Oral TID   heparin injection (subcutaneous)  5,000 Units Subcutaneous Q8H   levETIRAcetam  500 mg Oral BID   lidocaine  1 patch Transdermal Q24H   mouth rinse  15 mL Mouth Rinse BID   nicotine  21 mg Transdermal Daily   OLANZapine  5 mg Oral Daily   OLANZapine  7.5 mg Oral QHS   polyethylene glycol  17 g Oral Daily   sodium chloride flush  3 mL Intravenous Q12H   thiamine  250 mg Oral Daily   traZODone  100 mg Oral QHS   valproic acid  250 mg Oral BID   Continuous Infusions:  sodium chloride 10 mL/hr at 02/04/21 1457    Principal Problem:   Acute metabolic encephalopathy Active Problems:   Severe sepsis without septic shock ([name])   Fever   Pressure injury of skin   Alcoholic dementia ([name])   Wernicke's encephalopathy   Physical deconditioning   Right ventricular dysfunction  Consultants: Nephrology Psychiatry  Procedures: EEG Echocardiogram TDC  Antibiotics: Unasyn x1 dose, cefepime x1 dose, ceftriaxone x1 dose, vancomycin x2 doses   Time spent: 15 minutes    Junious Silk ANP  [facility] 7 am - 330 pm/M-F for direct patient care and secure chat Please refer to Amion for contact info 35  days

## 2021-02-09 NOTE — Progress Notes (Signed)
Occupational Therapy Treatment Patient Details Name: Steve SaverJames Gorin MRN: [id number] DOB: August 11, 1965 Today's Date: 02/09/2021   History of present illness 56 y.o. male brought to the ED on 12/20 from sober living group home for combativeness and altered sensorium.  Per collateral history, patient was not acting typical for last few days.  In the ED, patient was confused, combative  Labs showed AKI with creatinine of 3.27, rhabdomyolysis, abnormal LFTs, elevated WBC count at 23.7 and lactic acid level at 7.6.  CT head negative for acute bleeding or infarct but showed prominent fluid space at the left cerebral pontine angle raising possibility of epidermoid arachnoid cyst  Blood culture was sent, empiric antibiotic started.  IV fluid resuscitation started and patient was admitted to ICU.  12/21, underwent LP which showed staph in CSF, felt to be contaminant.  EEG did not show any seizures  12/24, HD catheter was placed but patient did not require HD as creatinine started to improve  12/31, with clinical improvement, [facility] PMH: major depression, anxiety   OT comments  Fayrene FearingJames continues to make incremental progress towards his acute goals. Overall pt is following commands better when given direct multimodal cues. He required min guard for all functional ambulation with RW, including grooming at the sink. Pt required mod-max A overall for oral hygiene at the sink. He continues to benefit from OT acutely. D/c recommendation remains appropriate.    Recommendations for follow up therapy are one component of a multi-disciplinary discharge planning process, led by the attending physician.  Recommendations may be updated based on patient status, additional functional criteria and insurance authorization.    Follow Up Recommendations  Skilled nursing-short term rehab (<3 hours/day)    Assistance Recommended at Discharge Frequent or constant Supervision/Assistance  Patient can  return home with the following  A little help with walking and/or transfers;A little help with bathing/dressing/bathroom;Assistance with cooking/housework;Assist for transportation;Help with stairs or ramp for entrance   Equipment Recommendations  None recommended by OT       Precautions / Restrictions Precautions Precautions: Fall Precaution Comments: h/o L foot pain; in enclosure bed Restrictions Weight Bearing Restrictions: Yes LLE Weight Bearing: Weight bearing as tolerated Other Position/Activity Restrictions: CAM boot not needed per ortho note       Mobility Bed Mobility Overal bed mobility: Needs Assistance Bed Mobility: Supine to Sit, Sit to Supine     Supine to sit: Supervision Sit to supine: Min assist   General bed mobility comments: multimodial cues to get in/out o fmed, min A to initiate pt to sit when getting back into the bed    Transfers Overall transfer level: Needs assistance Equipment used: Rolling walker (2 wheels) Transfers: Sit to/from Stand Sit to Stand: Min guard           General transfer comment: close min guard     Balance Overall balance assessment: Needs assistance Sitting-balance support: Feet supported Sitting balance-Leahy Scale: Fair     Standing balance support: No upper extremity supported, During functional activity Standing balance-Leahy Scale: Fair Standing balance comment: able to groom at the sink without UE support - close min guard for safety                           ADL either performed or assessed with clinical judgement   ADL Overall ADL's : Needs assistance/impaired     Grooming: Standing;Moderate assistance Grooming Details (indicate cue type and reason):  min guard for standing balance at the sink, mod A for sequencing tooth brushing task - pt initally placing toothpaste on the wrong end of the toothbrush without removing the plastic packaging             Lower Body Dressing: Set up;Bed  level Lower Body Dressing Details (indicate cue type and reason): long sitting in bed, pt donned bilat socks             Functional mobility during ADLs: Min guard;Minimal assistance;Rolling walker (2 wheels) General ADL Comments: Pt with better tolerance and cognition for OOB ADls this session. He required min A for safety and to re-direct pt with functional ambulation    Extremity/Trunk Assessment Upper Extremity Assessment Upper Extremity Assessment: Generalized weakness   Lower Extremity Assessment Lower Extremity Assessment: Defer to PT evaluation        Vision   Vision Assessment?: No apparent visual deficits          Cognition Arousal/Alertness: Awake/alert Behavior During Therapy: Flat affect Overall Cognitive Status: Impaired/Different from baseline Area of Impairment: Orientation, Attention, Memory, Following commands, Problem solving, Safety/judgement                 Orientation Level: Disoriented to, Place, Time, Situation Current Attention Level: Sustained Memory: Decreased short-term memory Following Commands: Follows one step commands inconsistently, Follows one step commands with increased time Safety/Judgement: Decreased awareness of safety, Decreased awareness of deficits Awareness: Intellectual Problem Solving: Slow processing, Decreased initiation, Difficulty sequencing, Requires verbal cues, Requires tactile cues General Comments: Pt required multimodial cues for commands. Better initiation to task once cued. When handed toothbrush & paste, pt put paste on plastic. Required max A to sequence appropriately.              General Comments Pt posey bed wet, pt scratching and picking at mesh netting of bed    Pertinent Vitals/ Pain       Pain Assessment Pain Assessment: Faces Faces Pain Scale: Hurts a little bit Pain Location: L foot/ankle Pain Descriptors / Indicators: Grimacing, Guarding Pain Intervention(s): Limited activity within  patient's tolerance, Monitored during session   Frequency  Min 2X/week        Progress Toward Goals  OT Goals(current goals can now be found in the care plan section)  Progress towards OT goals: Progressing toward goals  Acute Rehab OT Goals Patient Stated Goal: did not state OT Goal Formulation: With patient Time For Goal Achievement: 02/13/21 Potential to Achieve Goals: Fair ADL Goals Pt Will Perform Grooming: with modified independence;standing Pt Will Perform Lower Body Bathing: with modified independence;sit to/from stand Pt Will Perform Lower Body Dressing: with modified independence;sit to/from stand Pt Will Transfer to Toilet: with modified independence;ambulating Pt Will Perform Toileting - Clothing Manipulation and hygiene: with modified independence;sit to/from stand Additional ADL Goal #1: Pt will demonstrate decreased pain to LEs by tolerating light touch as needed for LE ADLs in order to increase independence with and tolerance for self care.  Plan Discharge plan remains appropriate;Other (comment)       AM-PAC OT "6 Clicks" Daily Activity     Outcome Measure   Help from another person eating meals?: None Help from another person taking care of personal grooming?: A Little Help from another person toileting, which includes using toliet, bedpan, or urinal?: A Lot Help from another person bathing (including washing, rinsing, drying)?: A Lot Help from another person to put on and taking off regular upper body clothing?: A Little Help from  another person to put on and taking off regular lower body clothing?: A Little 6 Click Score: 17    End of Session Equipment Utilized During Treatment: Gait belt;Rolling walker (2 wheels)  OT Visit Diagnosis: Unsteadiness on feet (R26.81);Muscle weakness (generalized) (M62.81);Dizziness and giddiness (R42) Pain - Right/Left: Left Pain - part of body: Leg;Ankle and joints of foot   Activity Tolerance Patient tolerated  treatment well   Patient Left in bed;with call bell/phone within reach   Nurse Communication Mobility status (bed soiled)        Time: 1342-1400 OT Time Calculation (min): 18 min  Charges: OT General Charges $OT Visit: 1 Visit   Damiah Mcdonald A Gracyn Allor 02/09/2021, 3:04 PM

## 2021-02-09 NOTE — Progress Notes (Signed)
Physical Therapy Treatment Patient Details Name: Steve [name] MRN: [id number] DOB: Oct 01, 1965 Today's Date: 02/09/2021   History of Present Illness 56 y.o. male brought to the ED on 12/20 from sober living group home for combativeness and altered sensorium.  Per collateral history, patient was not acting typical for last few days.  In the ED, patient was confused, combative  Labs showed AKI with creatinine of 3.27, rhabdomyolysis, abnormal LFTs, elevated WBC count at 23.7 and lactic acid level at 7.6.  CT head negative for acute bleeding or infarct but showed prominent fluid space at the left cerebral pontine angle raising possibility of epidermoid arachnoid cyst  Blood culture was sent, empiric antibiotic started.  IV fluid resuscitation started and patient was admitted to ICU.  12/21, underwent LP which showed staph in CSF, felt to be contaminant.  EEG did not show any seizures  12/24, HD catheter was placed but patient did not require HD as creatinine started to improve  12/31, with clinical improvement, patient was transferred out of ICU to [facility]. PMH: major depression, anxiety    PT Comments    Patient progressing with ambulation distance, though increased antalgic on L and noted area of redness over bunion and RN to check it out.  He responded well to simple commands repeated when given increased time.  Still generally weak needing RW and support especially due to cognitive deficits.  Remains appropriate for SNF level rehab at d/c.  PT will continue to follow acutely.    Recommendations for follow up therapy are one component of a multi-disciplinary discharge planning process, led by the attending physician.  Recommendations may be updated based on patient status, additional functional criteria and insurance authorization.  Follow Up Recommendations  Skilled nursing-short term rehab (<3 hours/day)     Assistance Recommended at Discharge Frequent or constant Supervision/Assistance  Patient can  return home with the following A little help with walking and/or transfers;Assistance with cooking/housework;A little help with bathing/dressing/bathroom;Direct supervision/assist for financial management;Direct supervision/assist for medications management;Assist for transportation   Equipment Recommendations  Rolling walker (2 wheels)    Recommendations for Other Services       Precautions / Restrictions Precautions Precautions: Fall Precaution Comments: h/o L foot pain; in enclosure bed Restrictions Other Position/Activity Restrictions: CAM boot not needed per ortho note     Mobility  Bed Mobility Overal bed mobility: Needs Assistance Bed Mobility: Supine to Sit, Sit to Supine     Supine to sit: Supervision Sit to supine: Min guard   General bed mobility comments: increased time and multimodal cues for up to EOB, but no physical help needed; to supine assist for foot clearing edge of enclosure bed    Transfers Overall transfer level: Needs assistance Equipment used: Rolling walker (2 wheels) Transfers: Sit to/from Stand Sit to Stand: Min assist           General transfer comment: assist for balance/safety    Ambulation/Gait Ambulation/Gait assistance: Min assist, Mod assist Gait Distance (Feet): 150 Feet (&100') Assistive device: Rolling walker (2 wheels) Gait Pattern/deviations: Step-to pattern, Step-through pattern, Decreased stride length, Antalgic, Wide base of support       General Gait Details: antalgic on L throughout and needing mod A at times for direction as pt trying to turn into wrong rooms; ran into door facing and obstacles on R side frequently when not cued; one seated rest due to L foot pain/fatigue; VSS   Stairs  Wheelchair Mobility    Modified Rankin (Stroke Patients Only)       Balance Overall balance assessment: Needs assistance Sitting-balance support: Feet supported Sitting balance-Leahy Scale: Good      Standing balance support: Bilateral upper extremity supported Standing balance-Leahy Scale: Poor Standing balance comment: UE support for balance                            Cognition Arousal/Alertness: Awake/alert Behavior During Therapy: Flat affect Overall Cognitive Status: Impaired/Different from baseline Area of Impairment: Orientation, Attention, Memory, Following commands, Problem solving, Safety/judgement                 Orientation Level: Disoriented to, Place, Time, Situation Current Attention Level: Sustained Memory: Decreased short-term memory Following Commands: Follows one step commands inconsistently, Follows one step commands with increased time Safety/Judgement: Decreased awareness of safety, Decreased awareness of deficits   Problem Solving: Slow processing, Decreased initiation, Requires verbal cues, Requires tactile cues          Exercises      General Comments General comments (skin integrity, edema, etc.): RN in room initially and had assisted to clean pt and give meds      Pertinent Vitals/Pain Pain Assessment Pain Assessment: Faces Faces Pain Scale: Hurts even more Pain Location: L great toe to palpation and with ambulation Pain Descriptors / Indicators: Grimacing, Guarding Pain Intervention(s): Monitored during session, Repositioned (RN aware)    Home Living                          Prior Function            PT Goals (current goals can now be found in the care plan section) Progress towards PT goals: Progressing toward goals    Frequency    Min 2X/week      PT Plan Current plan remains appropriate    Co-evaluation              AM-PAC PT "6 Clicks" Mobility   Outcome Measure  Help needed turning from your back to your side while in a flat bed without using bedrails?: None Help needed moving from lying on your back to sitting on the side of a flat bed without using bedrails?: None Help needed  moving to and from a bed to a chair (including a wheelchair)?: A Little Help needed standing up from a chair using your arms (e.g., wheelchair or bedside chair)?: A Little Help needed to walk in hospital room?: A Lot Help needed climbing 3-5 steps with a railing? : A Lot 6 Click Score: 18    End of Session Equipment Utilized During Treatment: Gait belt Activity Tolerance: Patient limited by fatigue;Patient limited by pain Patient left: in bed;with call bell/phone within reach   PT Visit Diagnosis: Other abnormalities of gait and mobility (R26.89);Other symptoms and signs involving the nervous system (R29.898);Pain Pain - Right/Left: Left Pain - part of body: Ankle and joints of foot     Time: 3748-2707 PT Time Calculation (min) (ACUTE ONLY): 19 min  Charges:  $Gait Training: 8-22 mins                     Sheran Lawless, PT Acute Rehabilitation Services Pager:[phone] [phone] 02/09/2021    Elray Mcgregor 02/09/2021, 12:55 PM

## 2021-02-10 NOTE — Progress Notes (Signed)
Notified by RN that restraint order needs to be renewed.  Pt continues to have confusion and combative at times.  Order renewed for 12 hours

## 2021-02-10 NOTE — TOC Progression Note (Signed)
Transition of Care [facility]) - Progression Note    Patient Details  Name: Antwoine Zorn MRN: [id number] Date of Birth: 1965/07/29  [facility] CM/SW Contact  Janae Bridgeman, RN Phone Number: 02/10/2021, 10:22 AM  Clinical Narrative:    Patient continuing to work with PT/OT with mobility and cognition issues.  Patient has no bed offers at this time for SNF placement and remains in the POSEY bed for safety issues due to impulsive behaviors and risk from falls.  Patient has multiple barriers for SNF placement including lack of payor source, history of ETOH use, impulsivity, homelessness and restraint requirements at this time.  CM and MSW with DTP Team continue to follow the patient for TOC needs.   Expected Discharge Plan:  (To be determined) Barriers to Discharge: Continued Medical Work up, Homeless with medical needs, Inadequate or no insurance  Expected Discharge Plan and Services Expected Discharge Plan:  (To be determined) In-house Referral: PCP / [facility] Discharge Planning Services: CM Consult, MATCH Program, Medication Assistance, Follow-up appt scheduled, Indigent [facility]   Living arrangements for the past 2 months: Group Home (Patient was resident at [facility] prior to hospital admission.) Expected Discharge Date: 01/24/21                                     Social Determinants of Health (SDOH) Interventions    Readmission Risk Interventions No flowsheet data found.

## 2021-02-10 NOTE — Progress Notes (Signed)
CSW spoke with Legrand Como at [facility] to obtain patient's SS number. Michael sent CSW a picture of the card which was forwarded to [place] to support the Medicaid application she is assisting the patient with.  Madilyn Fireman, MSW, LCSW Transitions of Care   Clinical Social Worker II [phone]

## 2021-02-10 NOTE — Progress Notes (Signed)
[facility] PROGRESS NOTE  Steve [name] [id number] DOB: [birth year] DOA: 01/05/2021 PCP: No primary care provider on file.  Status: Remains inpatient appropriate because:  Unsafe discharge Not medically stable secondary to ongoing physical therapy needs and severe cognitive impairment Anticipate will be able to discontinue Posey enclosure bed on Monday 1/30  Barriers to discharge: Social: Needs SNF but no funding in place-does not have a guardian but son willing to assist with management of patient regarding approval of procedures, signing appropriate paperwork, etc.  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 male with history of major depression, anxiety who was in a group home for last 5 months. He was brought to the ED on 12/20 from a group home for convalescence, combativeness and altered sensorium.  Per collateral history, patient was not acting typical for last few days. In the ED, patient was confused, combative Labs showed AKI with creatinine of 3.27, rhabdomyolysis, abnormal LFTs, elevated WBC count at 23.7 and lactic acid level at 7.6. 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  IV fluid resuscitation started Patient was admitted to ICU. 12/21, underwent LP which showed staph in CSF, felt to be contaminant.  EEG did not show any seizures 12/24, HD catheter was placed but patient did not require HD as creatinine started to improve 12/31, with clinical improvement, patient was  transferred out of ICU to [facility]. 1/18 Cognitive evaluations: 2 on the SLUMS, a 9 on the MMSE, and a 0 on medi-cog and short blessed test  Subjective: Alert.  Not restless.  Remains disoriented.  Pleasant otherwise but has extremely flat affect.  Objective: Vitals:   02/09/21 2358 02/10/21 0330  BP: (!) 102/34 112/76  Pulse: 96 98  Resp: 20 16  Temp: (!) 97.5 F (36.4 C) 97.9 F (36.6 C)  SpO2: 100% 100%    Intake/Output Summary (Last 24 hours) at 02/10/2021 0738 Last data filed at 02/09/2021 1013 Gross per 24 hour  Intake 354 ml  Output --  Net 354 ml   Filed Weights   02/02/21 0500 02/03/21 2357 02/04/21 0412  Weight: 60.5 kg 59.5 kg 59.5 kg    Exam:  Constitutional: NAD, calm and not restless Respiratory: clear to auscultation bilaterally, Normal respiratory effort. RA Cardiovascular: Regular pulse, no peripheral edema.  Normotensive.  S1-S2 Abdomen: no tenderness, no masses palpated. Bowel sounds positive. LBM 1/22 Neurologic: CN 2-12 grossly intact. Sensation intact, Strength 3+-4/5 x all 4 extremities.  Psychiatric: Alert, oriented to name only.  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  LFTs normal on 1/21 and  platelets normal at 368-repeat LFTs on Friday 1/27 Suspect this will be his new baseline noting no  significant improvement in the 29 days he has been hospitalized  Left lower extremity pain, weakness. Work-up including imaging unremarkable Does have a history of smoking therefore could benefit from PVD evaluation with arterial duplex and ABI Appears to have resolved noting patient no longer complaining of pain   Seizure Generalized convulsion was noted by his roommate prior to presentation. No apparent prior history of seizure disorder noting patient was not on AEDs prior to admission Unclear if withdrawal seizure or related to other substance-UDS positive for benzodiazepines and was probably obtained after was administered this class of medications for acute seizure activity EEG unremarkable.  Neurology consult appreciated.  Vimpat which was utilized during AKI transition back to [place] on 1/20   Acute renal failure secondary to rhabdomyolysis Acute metabolic acidosis His creatinine continue to worsen to peak at 7.48 on 12/26.  Nephrology consult was obtained.  He was prepped for dialysis, had a HD catheter placed but never needed dialysis.  Creatinine gradually improved to normal.  Serum bicarb level improved as well. Nephrology consult appreciated   Rhabdomyolysis Essentially resolved with IV fluid Peak CK greater than 50,000 and has decreased to 376 as of 1/3   Ruled out upper extremity DVT Per critical care note, hemodialysis cath had a clot on it when it was removed.  Ultrasound duplex scan of upper extremities was obtained on 12/31 which did not show any evidence of DVT.  Because of his risk of falls, noncompliance, recurrent seizures, I would avoid long-term anticoagulation without definite evidence of DVT.     Aspiration pneumonia Completed a course of Zosyn   Acute liver injury His AST and ALT were significantly elevated to over 700s on admission.  Gradually improved to normal.   Evidence of right ventricular dysfunction and recent echocardiogram Echo 1/2 with EF 60 to  65%.  Cannot rule out a small PFO. Not clinically significant as patient has no symptoms related to it. Continue to monitor as an outpatient.       Scheduled Meds:  amLODipine  2.5 mg Oral Daily   aspirin  325 mg Oral Daily   Or   aspirin  300 mg Rectal Daily   busPIRone  10 mg Oral BID   docusate sodium  100 mg Oral BID   feeding supplement  237 mL Oral BID BM   FLUoxetine  40 mg Oral Daily   folic acid  1 mg Oral Daily   gabapentin  600 mg Oral TID   heparin injection (subcutaneous)  5,000 Units Subcutaneous Q8H   levETIRAcetam  500 mg Oral BID   lidocaine  1 patch Transdermal Q24H   mouth rinse  15 mL Mouth Rinse BID   nicotine  21 mg Transdermal Daily   OLANZapine  5 mg Oral Daily   OLANZapine  7.5 mg Oral QHS   polyethylene glycol  17 g Oral Daily   sodium chloride flush  3 mL Intravenous Q12H   thiamine  250 mg Oral Daily   traZODone  100 mg Oral QHS   valproic acid  250 mg Oral BID   Continuous Infusions:  sodium chloride 10 mL/hr at 02/04/21 1457    Principal Problem:   Acute metabolic encephalopathy Active Problems:   Severe sepsis without septic shock ([place])   Fever   Pressure injury of skin   Alcoholic dementia ([place])   Wernicke's encephalopathy   Physical deconditioning  Right ventricular dysfunction   Consultants: Nephrology Psychiatry  Procedures: EEG Echocardiogram TDC  Antibiotics: Unasyn x1 dose, cefepime x1 dose, ceftriaxone x1 dose, vancomycin x2 doses   Time spent: 15 minutes    Erin Hearing ANP  [facility] 7 am - 330 pm/M-F for direct patient care and secure chat Please refer to Amion for contact info 36  days

## 2021-02-11 MED ORDER — VALPROIC ACID 250 MG PO CAPS: 500.0000 mg | ORAL_CAPSULE | Freq: Two times a day (BID) | ORAL | Status: DC

## 2021-02-11 MED ORDER — VALPROIC ACID 250 MG PO CAPS
500.0000 mg | ORAL_CAPSULE | Freq: Two times a day (BID) | ORAL | Status: DC
  Administered 2021-02-11 – 2021-02-26 (×31): 500 mg via ORAL
  Filled 2021-02-11 (×33): qty 2

## 2021-02-11 NOTE — Plan of Care (Signed)

## 2021-02-11 NOTE — Progress Notes (Addendum)
[facility] PROGRESS NOTE  Steve [name] EGB:[id number] DOB: 12-11-1965 DOA: 01/05/2021 PCP: No primary care provider on file.  Status: Remains inpatient appropriate because:  Unsafe discharge Not medically stable secondary to ongoing physical therapy needs and severe cognitive impairment Anticipate will be able to discontinue Posey enclosure bed on Monday 1/30  Barriers to discharge: Social: Needs SNF but no funding in place-does not have a guardian but son willing to assist with management of patient regarding approval of procedures, signing appropriate paperwork, etc.  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 male with history of major depression, anxiety who was in a group home for last 5 months. He was brought to the ED on 12/20 from a group home for convalescence, combativeness and altered sensorium.  Per collateral history, patient was not acting typical for last few days. In the ED, patient was confused, combative Labs showed AKI with creatinine of 3.27, rhabdomyolysis, abnormal LFTs, elevated WBC count at 23.7 and lactic acid level at 7.6. 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  IV fluid resuscitation started Patient was admitted to ICU. 12/21, underwent LP which showed staph in CSF, felt to be contaminant.  EEG did not show any seizures 12/24, HD catheter was placed but patient did not require HD as creatinine started to improve 12/31, with clinical improvement, patient was  transferred out of ICU to [facility]. 1/18 Cognitive evaluations: 2 on the SLUMS, a 9 on the MMSE, and a 0 on medi-cog and short blessed test  Subjective: Alert and calm sitting in Posey bed remains confused  Objective: Vitals:   02/10/21 2348 02/11/21 0407  BP: 105/79 112/72  Pulse: (!) 104 86  Resp: 19 17  Temp: 97.7 F (36.5 C)   SpO2: 99%     Intake/Output Summary (Last 24 hours) at 02/11/2021 0733 Last data filed at 02/10/2021 1700 Gross per 24 hour  Intake --  Output 1 ml  Net -1 ml   Filed Weights   02/02/21 0500 02/03/21 2357 02/04/21 0412  Weight: 60.5 kg 59.5 kg 59.5 kg    Exam:  Constitutional: NAD, calm and not restless Respiratory: clear to auscultation bilaterally, Normal respiratory effort. RA Cardiovascular: Regular pulse, no peripheral edema.  Normotensive.  S1-S2 Abdomen: no tenderness, no masses palpated. Bowel sounds positive. LBM 1/22 Neurologic: CN 2-12 grossly intact. Sensation intact, Strength 3+-4/5 x all 4 extremities.  Psychiatric: Alert, oriented to name only.  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  1/26 discussed with nurse who states patient still having some episodes of inappropriate behaviors and impulsivity so we will increase Depakote dosage today.  LFTs normal on 1/21 and platelets normal at 368-repeat  LFTs on Friday 1/27 Suspect this will be his new baseline noting no significant  improvement in the 29 days he has been hospitalized  Left lower extremity pain, weakness. Work-up including imaging unremarkable Does have a history of smoking therefore could benefit from PVD evaluation with arterial duplex and ABI Appears to have resolved noting patient no longer complaining of pain   Seizure Generalized convulsion was noted by his roommate prior to presentation. No apparent prior history of seizure disorder noting patient was not on AEDs prior to admission Unclear if withdrawal seizure or related to other substance-UDS positive for benzodiazepines and was probably obtained after was administered this class of medications for acute seizure activity EEG unremarkable.  Neurology consult appreciated.  Vimpat which was utilized during AKI transitioned back to Kepp[place] on 1/20   Acute renal failure secondary to rhabdomyolysis Acute metabolic acidosis His creatinine continue to worsen to peak at 7.48 on 12/26.  Nephrology consult was obtained.  He was prepped for dialysis, had a HD catheter placed but never needed dialysis.  Creatinine gradually improved to normal.  Serum bicarb level improved as well. Nephrology consult appreciated   Rhabdomyolysis Resolved   Ruled out upper extremity DVT Per critical care note, hemodialysis cath had a clot on it when it was removed.  UE duplex  12/31: no DVT.     Aspiration pneumonia Completed a course of Zosyn   Acute liver injury His AST and ALT were significantly elevated to over 700s on admission.  Gradually improved to normal.   Evidence of right ventricular dysfunction and recent echocardiogram Echo 1/2 with EF 60 to 65%.  Cannot rule out a small PFO. Not clinically significant as patient has no symptoms related to it. Continue to monitor as an outpatient.       Scheduled Meds:  amLODipine  2.5 mg Oral Daily   aspirin  325 mg Oral Daily   Or   aspirin  300 mg Rectal Daily   busPIRone  10 mg Oral BID   docusate sodium   100 mg Oral BID   feeding supplement  237 mL Oral BID BM   FLUoxetine  40 mg Oral Daily   folic acid  1 mg Oral Daily   gabapentin  600 mg Oral TID   heparin injection (subcutaneous)  5,000 Units Subcutaneous Q8H   levETIRAcetam  500 mg Oral BID   lidocaine  1 patch Transdermal Q24H   mouth rinse  15 mL Mouth Rinse BID   nicotine  21 mg Transdermal Daily   OLANZapine  5 mg Oral Daily   OLANZapine  7.5 mg Oral QHS   polyethylene glycol  17 g Oral Daily   sodium chloride flush  3 mL Intravenous Q12H   thiamine  250 mg Oral Daily   traZODone  100 mg Oral QHS   valproic acid  250 mg Oral BID   Continuous Infusions:  sodium chloride 10 mL/hr at 02/04/21 1457    Principal Problem:   Acute metabolic encephalopathy Active Problems:   Severe sepsis without septic shock ([name])   Fever   Pressure injury of skin   Alcoholic dementia ([name])   Wernicke's encephalopathy   Physical deconditioning   Right ventricular dysfunction   Consultants: Nephrology Psychiatry  Procedures: EEG Echocardiogram TDC  Antibiotics: Unasyn x1 dose, cefepime x1 dose, ceftriaxone x1 dose, vancomycin x2 doses   Time spent: 15 minutes    Junious Silk ANP  [facility] 7 am - 330 pm/M-F for direct patient care  and secure chat Please refer to Amion for contact info 37  days

## 2021-02-11 NOTE — Progress Notes (Signed)
Physical Therapy Treatment Patient Details Name: Steve [name] MRN: [id number] DOB: June 20, 1965 Today's Date: 02/11/2021   History of Present Illness 56 y.o. male brought to the ED on 12/20 from sober living group home for combativeness and altered sensorium.  Per collateral history, patient was not acting typical for last few days.  In the ED, patient was confused, combative  Labs showed AKI with creatinine of 3.27, rhabdomyolysis, abnormal LFTs, elevated WBC count at 23.7 and lactic acid level at 7.6.  CT head negative for acute bleeding or infarct but showed prominent fluid space at the left cerebral pontine angle raising possibility of epidermoid arachnoid cyst  Blood culture was sent, empiric antibiotic started.  IV fluid resuscitation started and patient was admitted to ICU.  12/21, underwent LP which showed staph in CSF, felt to be contaminant.  EEG did not show any seizures  12/24, HD catheter was placed but patient did not require HD as creatinine started to improve  12/31, with clinical improvement, patient was transferred out of ICU to [facility]. PMH: major depression, anxiety    PT Comments    Patient progressing slowly but able to ambulate and perform ADL tasks in the room.  Still presenting with significant cognitive deficits.  Will request SLP for cognitive linguistic eval.  PT will continue to follow.    Recommendations for follow up therapy are one component of a multi-disciplinary discharge planning process, led by the attending physician.  Recommendations may be updated based on patient status, additional functional criteria and insurance authorization.  Follow Up Recommendations  Skilled nursing-short term rehab (<3 hours/day)     Assistance Recommended at Discharge Frequent or constant Supervision/Assistance  Patient can return home with the following A little help with walking and/or transfers;Assistance with cooking/housework;A little help with bathing/dressing/bathroom;Direct  supervision/assist for financial management;Direct supervision/assist for medications management;Assist for transportation   Equipment Recommendations  Rolling walker (2 wheels)    Recommendations for Other Services Speech consult     Precautions / Restrictions Precautions Precautions: Fall Precaution Comments: h/o L foot pain; in enclosure bed     Mobility  Bed Mobility Overal bed mobility: Needs Assistance Bed Mobility: Supine to Sit, Sit to Supine     Supine to sit: Supervision Sit to supine: Supervision   General bed mobility comments: returned to supine with min cues, mod cues to scoot to middle of the bed    Transfers Overall transfer level: Needs assistance Equipment used: None Transfers: Sit to/from Stand Sit to Stand: Min guard                Ambulation/Gait Ambulation/Gait assistance: Min assist, Mod assist Gait Distance (Feet): 200 Feet Assistive device: Rolling walker (2 wheels) Gait Pattern/deviations: Decreased stride length, Step-to pattern, Step-through pattern, Antalgic       General Gait Details: assist for directions, antalgic on L, able to walk and complete counting task, then to read sign on the wall, but could not read anything on busy bulliten board   Stairs             Wheelchair Mobility    Modified Rankin (Stroke Patients Only)       Balance Overall balance assessment: Needs assistance   Sitting balance-Leahy Scale: Fair     Standing balance support: No upper extremity supported, During functional activity Standing balance-Leahy Scale: Poor Standing balance comment: brushing teeth at sink needed assist for balance as leaning back at times  Cognition Arousal/Alertness: Awake/alert Behavior During Therapy: Flat affect Overall Cognitive Status: Impaired/Different from baseline Area of Impairment: Orientation, Attention, Memory, Following commands, Problem solving,  Safety/judgement                 Orientation Level: Disoriented to, Place, Time, Situation Current Attention Level: Sustained Memory: Decreased short-term memory Following Commands: Follows one step commands inconsistently, Follows one step commands with increased time Safety/Judgement: Decreased awareness of safety, Decreased awareness of deficits     General Comments: attention to brushing teeth and able to sequence with min questioning cues, counted by 2's to 14 (asked him to count to 24, needed mod cues rest of they way) while walking, able to read sign on door in hallway with 2 errors within 1 sentence, knew he was in [place], but given choice of Doctor's office or hospital chose "office"        Exercises      General Comments General comments (skin integrity, edema, etc.): asked to toilet so after brushing teeth walked to bathroom and pt attempting to pull down his gown as if it were his pants, needed max cues and assist to sit on toilet for safety and to prevent incontinent episode; assisted for hygiene.      Pertinent Vitals/Pain Pain Assessment Faces Pain Scale: Hurts a little bit Pain Location: L foot/ankle Pain Descriptors / Indicators: Grimacing, Guarding Pain Intervention(s): Monitored during session, Repositioned    Home Living                          Prior Function            PT Goals (current goals can now be found in the care plan section) Progress towards PT goals: Progressing toward goals    Frequency    Min 2X/week      PT Plan Current plan remains appropriate    Co-evaluation              AM-PAC PT "6 Clicks" Mobility   Outcome Measure  Help needed turning from your back to your side while in a flat bed without using bedrails?: None Help needed moving from lying on your back to sitting on the side of a flat bed without using bedrails?: None Help needed moving to and from a bed to a chair (including a wheelchair)?: A  Little Help needed standing up from a chair using your arms (e.g., wheelchair or bedside chair)?: A Little Help needed to walk in hospital room?: A Lot Help needed climbing 3-5 steps with a railing? : A Lot 6 Click Score: 18    End of Session Equipment Utilized During Treatment: Gait belt Activity Tolerance: Patient tolerated treatment well Patient left: in bed;with call bell/phone within reach;with restraints reapplied (in posey bed)   PT Visit Diagnosis: Other abnormalities of gait and mobility (R26.89);Other symptoms and signs involving the nervous system (R29.898);Pain Pain - Right/Left: Left Pain - part of body: Ankle and joints of foot     Time: 9323-5573 PT Time Calculation (min) (ACUTE ONLY): 24 min  Charges:  $Gait Training: 8-22 mins $Therapeutic Activity: 8-22 mins                     Sheran Lawless, PT Acute Rehabilitation Services Pager:[phone] Office:[phone] 02/11/2021    Elray Mcgregor 02/11/2021, 4:56 PM

## 2021-02-12 NOTE — Progress Notes (Signed)
Occupational Therapy Treatment Patient Details Name: Steve [name] MRN: [id number] DOB: 10/21/1965 Today's Date: 02/12/2021   History of present illness 56 y.o. male brought to the ED on 12/20 from sober living group home for combativeness and altered sensorium.  Per collateral history, patient was not acting typical for last few days.  In the ED, patient was confused, combative  Labs showed AKI with creatinine of 3.27, rhabdomyolysis, abnormal LFTs, elevated WBC count at 23.7 and lactic acid level at 7.6.  CT head negative for acute bleeding or infarct but showed prominent fluid space at the left cerebral pontine angle raising possibility of epidermoid arachnoid cyst  Blood culture was sent, empiric antibiotic started.  IV fluid resuscitation started and patient was admitted to ICU.  12/21, underwent LP which showed staph in CSF, felt to be contaminant.  EEG did not show any seizures  12/24, HD catheter was placed but patient did not require HD as creatinine started to improve  12/31, with clinical improvement, patient was transferred out of ICU to [facility]. PMH: major depression, anxiety   OT comments  Pt making incremental progress with OT goals. This session pt ambulated to the sink and completed limited grooming ADL's. Pt having multiple LoB in standing, requiring constant redirection and cuing for sequencing and safety. OT will continue to follow to address cognition, balance, safety, functional mobility, and ADL performance.    Recommendations for follow up therapy are one component of a multi-disciplinary discharge planning process, led by the attending physician.  Recommendations may be updated based on patient status, additional functional criteria and insurance authorization.    Follow Up Recommendations  Skilled nursing-short term rehab (<3 hours/day)    Assistance Recommended at Discharge Frequent or constant Supervision/Assistance  Patient can return home with the following  A little help  with walking and/or transfers;A little help with bathing/dressing/bathroom;Assistance with cooking/housework;Assist for transportation;Help with stairs or ramp for entrance   Equipment Recommendations  None recommended by OT    Recommendations for Other Services      Precautions / Restrictions Precautions Precautions: Fall Precaution Comments: h/o L foot pain; in enclosure bed Restrictions Weight Bearing Restrictions: No       Mobility Bed Mobility Overal bed mobility: Needs Assistance Bed Mobility: Supine to Sit, Sit to Supine     Supine to sit: Supervision Sit to supine: Supervision   General bed mobility comments: No physical assist with any bed mobility, however max verbal cuing for safety and redirection    Transfers Overall transfer level: Needs assistance Equipment used: Rolling walker (2 wheels) Transfers: Sit to/from Stand Sit to Stand: Min assist           General transfer comment: Min A due to R lean and multiple LoB     Balance Overall balance assessment: Needs assistance Sitting-balance support: Feet supported Sitting balance-Leahy Scale: Good Sitting balance - Comments: Impulsively leaning and repositioning multiple times   Standing balance support: No upper extremity supported, During functional activity Standing balance-Leahy Scale: Poor Standing balance comment: Needing assist due to pt leaning towards the R side and LoB                           ADL either performed or assessed with clinical judgement   ADL Overall ADL's : Needs assistance/impaired Eating/Feeding: Independent;Sitting Eating/Feeding Details (indicate cue type and reason): Pt self feeding himself applesauce sitting at sink Grooming: Wash/dry hands;Wash/dry face;Moderate assistance Grooming Details (indicate cue type and reason):  Frequent LoB, reliant on RW, constant redirection and cuing to complete tasks         Upper Body Dressing : Moderate  assistance;Sitting Upper Body Dressing Details (indicate cue type and reason): completed EOB, needed assistance for sequencing Lower Body Dressing: Sitting/lateral leans;Moderate assistance Lower Body Dressing Details (indicate cue type and reason): Needing assist to don socks EOB, cuing for sequencing and safety             Functional mobility during ADLs: Minimal assistance;Rolling walker (2 wheels) General ADL Comments: Pt requiring max verbal and tactile cuing for all tasks, as well as constant redirection    Extremity/Trunk Assessment              Vision       Perception     Praxis      Cognition Arousal/Alertness: Awake/alert Behavior During Therapy: Flat affect Overall Cognitive Status: No family/caregiver present to determine baseline cognitive functioning                                 General Comments: Pt not following directions well this session, requiring constant redirection, very impulsive        Exercises      Shoulder Instructions       General Comments VSS on RA    Pertinent Vitals/ Pain       Pain Assessment Pain Assessment: No/denies pain  Home Living                                          Prior Functioning/Environment              Frequency  Min 2X/week        Progress Toward Goals  OT Goals(current goals can now be found in the care plan section)  Progress towards OT goals: Progressing toward goals  Acute Rehab OT Goals Patient Stated Goal: None stated OT Goal Formulation: With patient Time For Goal Achievement: 02/26/21 Potential to Achieve Goals: Fair ADL Goals Pt Will Perform Grooming: with modified independence;standing Pt Will Perform Lower Body Bathing: with modified independence;sit to/from stand Pt Will Perform Lower Body Dressing: with modified independence;sit to/from stand Pt Will Transfer to Toilet: with modified independence;ambulating Pt Will Perform Toileting -  Clothing Manipulation and hygiene: with modified independence;sit to/from stand Additional ADL Goal #1: Pt will demonstrate decreased pain to LEs by tolerating light touch as needed for LE ADLs in order to increase independence with and tolerance for self care.  Plan Discharge plan remains appropriate    Co-evaluation                 AM-PAC OT "6 Clicks" Daily Activity     Outcome Measure   Help from another person eating meals?: None Help from another person taking care of personal grooming?: A Little Help from another person toileting, which includes using toliet, bedpan, or urinal?: A Lot Help from another person bathing (including washing, rinsing, drying)?: A Lot Help from another person to put on and taking off regular upper body clothing?: A Little Help from another person to put on and taking off regular lower body clothing?: A Lot 6 Click Score: 16    End of Session Equipment Utilized During Treatment: Gait belt;Rolling walker (2 wheels)  OT Visit Diagnosis: Unsteadiness on feet (R26.81);Muscle weakness (generalized) (  M62.81);Dizziness and giddiness (R42) Pain - Right/Left: Left Pain - part of body: Leg;Ankle and joints of foot   Activity Tolerance Patient tolerated treatment well   Patient Left in bed;with call bell/phone within reach   Nurse Communication Mobility status        Time: [id number] OT Time Calculation (min): 18 min  Charges: OT General Charges $OT Visit: 1 Visit OT Treatments $Self Care/Home Management : 8-22 mins  Lawanda Holzheimer H., OTR/L Acute Rehabilitation  Libbi Towner Elane Bing PlumeHaynes 02/12/2021, 5:49 PM

## 2021-02-12 NOTE — Progress Notes (Signed)
[facility] PROGRESS NOTE  Steve [name] VHQ:[id number] DOB: 04/30/1965 DOA: 01/05/2021 PCP: No primary care provider on file.  Status: Remains inpatient appropriate because:  Unsafe discharge Not medically stable secondary to ongoing physical therapy needs and severe cognitive impairment Anticipate will be able to discontinue Posey enclosure bed on Monday 1/30  Barriers to discharge: Social: Needs SNF but no funding in place-does not have a guardian but son willing to assist with management of patient regarding approval of procedures, signing appropriate paperwork, etc.  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 male with history of major depression, anxiety who was in a group home for last 5 months. He was brought to the ED on 12/20 from a group home for convalescence, combativeness and altered sensorium.  Per collateral history, patient was not acting typical for last few days. In the ED, patient was confused, combative Labs showed AKI with creatinine of 3.27, rhabdomyolysis, abnormal LFTs, elevated WBC count at 23.7 and lactic acid level at 7.6. 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  IV fluid resuscitation started Patient was admitted to ICU. 12/21, underwent LP which showed staph in CSF, felt to be contaminant.  EEG did not show any seizures 12/24, HD catheter was placed but patient did not require HD as creatinine started to improve 12/31, with clinical improvement, patient was  transferred out of ICU to [facility]. 1/18 Cognitive evaluations: 2 on the SLUMS, a 9 on the MMSE, and a 0 on medi-cog and short blessed test  Subjective: Patient awake, pleasant but remains very confused.  Having trouble answering questions and finding the correct words noting he used the word anxiety when he meant to say headache  Objective: Vitals:   02/11/21 1539 02/11/21 2024  BP: (!) 121/94 116/84  Pulse: 95 88  Resp: 20 20  SpO2: 100% 100%    Intake/Output Summary (Last 24 hours) at 02/12/2021 0742 Last data filed at 02/11/2021 2245 Gross per 24 hour  Intake 883 ml  Output 2 ml  Net 881 ml   Filed Weights   02/02/21 0500 02/03/21 2357 02/04/21 0412  Weight: 60.5 kg 59.5 kg 59.5 kg    Exam:  Constitutional: NAD, calm and not restless Respiratory: clear to auscultation bilaterally, Normal respiratory effort. RA Cardiovascular: Regular pulse, no peripheral edema.  Normotensive.  S1-S2 Abdomen: no tenderness, no masses palpated. Bowel sounds positive. LBM 1/26 Neurologic: CN 2-12 grossly intact. Sensation intact, Strength 3+-4/5 x all 4 extremities.  Psychiatric: Alert, oriented to name only.  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  Dosage increased on 1/26.  LFTs and platelets also normal on 1/27 Suspect this will be his new  baseline noting no significant improvement in the 29 days he has been  hospitalized  Left lower extremity pain, weakness. Work-up including imaging unremarkable Does have a history of smoking therefore could benefit from PVD evaluation with arterial duplex and ABI Appears to have resolved noting patient no longer complaining of pain   Seizure Generalized convulsion was noted by his roommate prior to presentation. No apparent prior history of seizure disorder noting patient was not on AEDs prior to admission Unclear if withdrawal seizure or related to other substance-UDS positive for benzodiazepines and was probably obtained after was administered this class of medications for acute seizure activity EEG unremarkable.  Neurology consult appreciated.  Vimpat which was utilized during AKI transitioned back to Keppra on 1/20   Acute renal failure secondary to rhabdomyolysis Acute metabolic acidosis His creatinine continue to worsen to peak at 7.48 on 12/26.  Nephrology consult was obtained.  He was prepped for dialysis, had a HD catheter placed but never needed dialysis.  Creatinine gradually improved to normal.  Serum bicarb level improved as well. Nephrology consult appreciated   Rhabdomyolysis Resolved   Ruled out upper extremity DVT Per critical care note, hemodialysis cath had a clot on it when it was removed.  UE duplex  12/31: no DVT.     Aspiration pneumonia Completed a course of Zosyn   Acute liver injury His AST and ALT were significantly elevated to over 700s on admission.  Gradually improved to normal.   Evidence of right ventricular dysfunction and recent echocardiogram Echo 1/2 with EF 60 to 65%.  Cannot rule out a small PFO. Not clinically significant as patient has no symptoms related to it. Continue to monitor as an outpatient.       Scheduled Meds:  amLODipine  2.5 mg Oral Daily   aspirin  325 mg Oral Daily   Or   aspirin  300 mg Rectal Daily   busPIRone  10 mg Oral BID   docusate sodium  100 mg Oral BID   feeding supplement   237 mL Oral BID BM   FLUoxetine  40 mg Oral Daily   folic acid  1 mg Oral Daily   gabapentin  600 mg Oral TID   heparin injection (subcutaneous)  5,000 Units Subcutaneous Q8H   levETIRAcetam  500 mg Oral BID   lidocaine  1 patch Transdermal Q24H   mouth rinse  15 mL Mouth Rinse BID   nicotine  21 mg Transdermal Daily   OLANZapine  5 mg Oral Daily   OLANZapine  7.5 mg Oral QHS   polyethylene glycol  17 g Oral Daily   sodium chloride flush  3 mL Intravenous Q12H   thiamine  250 mg Oral Daily   traZODone  100 mg Oral QHS   valproic acid  500 mg Oral BID   Continuous Infusions:  sodium chloride 10 mL/hr at 02/04/21 1457    Principal Problem:   Acute metabolic encephalopathy Active Problems:   Severe sepsis without septic shock ([name])   Fever   Pressure injury of skin   Alcoholic dementia ([name])   Wernicke's encephalopathy   Physical deconditioning   Right ventricular dysfunction   Consultants: Nephrology Psychiatry  Procedures: EEG Echocardiogram TDC  Antibiotics: Unasyn x1 dose, cefepime x1 dose, ceftriaxone x1 dose, vancomycin x2 doses   Time spent: 15 minutes    Junious SilkAllison Ramelo Oetken ANP  [facility] 7 am - 330 pm/M-F for direct patient care and secure chat Please refer to Amion for contact info  38  days

## 2021-02-12 NOTE — Evaluation (Signed)
Speech Language Pathology Evaluation Patient Details Name: Steve [name] MRN: [id number] DOB: 08/15/1965 Today's Date: 02/12/2021 Time: 6812-7517 SLP Time Calculation (min) (ACUTE ONLY): 24 min  Problem List:  Patient Active Problem List   Diagnosis Date Noted   Alcoholic dementia ([name]) 02/04/2021   Wernicke's encephalopathy 02/04/2021   Physical deconditioning 02/04/2021   Right ventricular dysfunction 02/04/2021   Pressure injury of skin 01/13/2021   Fever    Severe sepsis without septic shock ([name])    Acute metabolic encephalopathy 01/05/2021   Past Medical History: History reviewed. No pertinent past medical history. Past Surgical History: History reviewed. No pertinent surgical history. HPI:  56 y.o. male brought to the ED on 12/20 from sober living group home for combativeness and altered sensorium.  Per collateral history, patient was not acting typical for last few days.  In the ED, patient was confused, combative  Labs showed AKI with creatinine of 3.27, rhabdomyolysis, abnormal LFTs, elevated WBC count at 23.7 and lactic acid level at 7.6.  CT head negative for acute bleeding or infarct but showed prominent fluid space at the left cerebral pontine angle raising possibility of epidermoid arachnoid cyst  Blood culture was sent, empiric antibiotic started.  IV fluid resuscitation started and patient was admitted to ICU.  12/21, underwent LP which showed staph in CSF, felt to be contaminant.  EEG did not show any seizures  12/24, HD catheter was placed but patient did not require HD as creatinine started to improve  12/31, with clinical improvement, patient was transferred out of ICU to [facility]. 02/03/21 SLUMS score: 2/30. PMH: major depression, anxiety   Assessment / Plan / Recommendation Clinical Impression  RN reports pt with continued worsening cognition over course of admission. He was previously seen by SLP for cognitive evaluation 02/03/21 with [facility] Mental Status  Examination (SLUMS) administered and pt yielding the following score: 2/30. While pt's true baseline is unknown, today's SLUMS results reflect a decline in cognitive status since previous testing with today's score of 1/30. Focused and sustained attention remains poor, requiring frequent and sometimes constant clinician cueing for redirection to task. He appeared restless and kept grabbing/moving objects around in posey bed. He also demosntrates deficits in orientation, immediate (1/5) and delayed (0/5) recall, problem solving, and executive functions. While he is able to provide full name, he did not provide his age accurately. He demonstrates strengths in communicating wants/needs in a simple way when asked. With repetition, he is able to follow very simple commands.  Motor speech appeared [facility]. ST services warranted to trial assessment/development of strategies/modifications for attention, command following, ect and see if this will help staff when caring for pt from day to day.    SLP Assessment  SLP Recommendation/Assessment: Patient needs continued Speech Lanaguage Pathology Services SLP Visit Diagnosis: Cognitive communication deficit (R41.841);Attention and concentration deficit Attention and concentration deficit following: encephalopathy   Recommendations for follow up therapy are one component of a multi-disciplinary discharge planning process, led by the attending physician.  Recommendations may be updated based on patient status, additional functional criteria and insurance authorization.    Follow Up Recommendations  Skilled nursing-short term rehab (<3 hours/day)    Assistance Recommended at Discharge  Frequent or constant Supervision/Assistance  Functional Status Assessment Patient has had a recent decline in their functional status and demonstrates the ability to make significant improvements in function in a reasonable and predictable amount of time.  Frequency and Duration min  2x/week  2 weeks  SLP Evaluation Cognition  Overall Cognitive Status: No family/caregiver present to determine baseline cognitive functioning (SLUMS score) Arousal/Alertness: Awake/alert Orientation Level: Oriented to person Year: 2022 Month: December Day of Week: Incorrect Attention: Focused;Sustained Focused Attention: Impaired Focused Attention Impairment: Verbal basic Sustained Attention: Impaired Sustained Attention Impairment: Verbal basic Memory: Impaired Memory Impairment: Storage deficit;Decreased recall of new information;Decreased short term memory Decreased Short Term Memory: Verbal basic;Verbal complex Immediate Memory Recall:  (SLUMS: 1/5 immediate; 0/5 delayed) Awareness: Impaired Awareness Impairment: Intellectual impairment Problem Solving: Impaired Problem Solving Impairment: Verbal basic Executive Function: Reasoning;Organizing;Sequencing Reasoning: Impaired Reasoning Impairment: Verbal basic Sequencing: Impaired Sequencing Impairment: Verbal basic;Verbal complex (clock drawing 0/4) Organizing: Impaired Organizing Impairment: Verbal basic Behaviors: Restless       Comprehension  Visual Recognition/Discrimination Discrimination: Not tested Reading Comprehension Reading Status: Not tested    Expression Expression Primary Mode of Expression: Verbal Verbal Expression Overall Verbal Expression: Impaired Pragmatics: Impairment Impairments: Topic maintenance;Topic appropriateness;Eye contact;Abnormal affect Interfering Components: Attention Written Expression Written Expression: Not tested   Oral / Motor  Oral Motor/Sensory Function Overall Oral Motor/Sensory Function: Within functional limits Motor Speech Overall Motor Speech: Appears within functional limits for tasks assessed            Avie Echevaria, MA, CCC-SLP Acute Rehabilitation Services Office Number: [phone]  Paulette Blanch 02/12/2021, 10:47 AM

## 2021-02-12 NOTE — Plan of Care (Signed)
°  Problem: Education: Goal: Knowledge of General Education information will improve Description: Including pain rating scale, medication(s)/side effects and non-pharmacologic comfort measures Outcome: Progressing   Problem: Medication: Goal: Risk for medication side effects will decrease Outcome: Progressing

## 2021-02-13 LAB — CBC WITH DIFFERENTIAL/PLATELET
Abs Immature Granulocytes: 0.01 10*3/uL (ref 0.00–0.07)
Basophils Absolute: 0.1 10*3/uL (ref 0.0–0.1)
Basophils Relative: 1 %
Eosinophils Absolute: 0.2 10*3/uL (ref 0.0–0.5)
Eosinophils Relative: 3 %
HCT: 33.7 % — ABNORMAL LOW (ref 39.0–52.0)
Hemoglobin: 12.1 g/dL — ABNORMAL LOW (ref 13.0–17.0)
Immature Granulocytes: 0 %
Lymphocytes Relative: 50 %
Lymphs Abs: 3.3 10*3/uL (ref 0.7–4.0)
MCH: 32.9 pg (ref 26.0–34.0)
MCHC: 35.9 g/dL (ref 30.0–36.0)
MCV: 91.6 fL (ref 80.0–100.0)
Monocytes Absolute: 0.6 10*3/uL (ref 0.1–1.0)
Monocytes Relative: 10 %
Neutro Abs: 2.4 10*3/uL (ref 1.7–7.7)
Neutrophils Relative %: 36 %
Platelets: 246 10*3/uL (ref 150–400)
RBC: 3.68 MIL/uL — ABNORMAL LOW (ref 4.22–5.81)
RDW: 12.1 % (ref 11.5–15.5)
WBC: 6.6 10*3/uL (ref 4.0–10.5)
nRBC: 0 % (ref 0.0–0.2)

## 2021-02-13 LAB — BASIC METABOLIC PANEL
Anion gap: 8 (ref 5–15)
BUN: 21 mg/dL — ABNORMAL HIGH (ref 6–20)
CO2: 28 mmol/L (ref 22–32)
Calcium: 10 mg/dL (ref 8.9–10.3)
Chloride: 102 mmol/L (ref 98–111)
Creatinine, Ser: 0.75 mg/dL (ref 0.61–1.24)
GFR, Estimated: >60 mL/min (ref >60)
Glucose, Bld: 98 mg/dL (ref 70–99)
Potassium: 3.9 mmol/L (ref 3.5–5.1)
Sodium: 138 mmol/L (ref 135–145)

## 2021-02-13 NOTE — Progress Notes (Signed)
PROGRESS NOTE    Steve [name]  HER:[id number] DOB: 09-16-1965 DOA: 01/05/2021 PCP: No primary care provider on file.   Chief Complain: Altered mental status  Brief Narrative: Patient is a 56 year old male with history of depression, anxiety who came from group home to the ED here on 12/20 for evaluation of altered mental status, combativeness.  On presentation, lab work showed AKI with creatinine of 3.2, rhabdomyolysis, abnormal LFTs, elevated WBC count, elevated lactate.  CT head negative for acute bleed or infarct but showed prominent fluid space at the left cerebellopontine angle raising possibility of epidermoid arachnoid cyst.  Patient was initially admitted to ICU.  EEG did not show any seizure.  He was also placed hemodialysis catheter due to concern for worsening renal function but did not require hemodialysis as creatinine started improving.  He was transferred to [facility] service on 12/31.  Prolonged hospitalization due to difficulty in placement.  Assessment & Plan:   Principal Problem:   Acute metabolic encephalopathy Active Problems:   Severe sepsis without septic shock ([name])   Fever   Pressure injury of skin   Alcoholic dementia ([name])   Wernicke's encephalopathy   Physical deconditioning   Right ventricular dysfunction   Cognitive impairment: Has history of chronic alcoholism.  There was suspicion for alcohol associated dementia /Wernicke's encephalopathy.  Continues to have flat affect, paranoid type behaviors, cognitive impairment.  Currently on Prozac, BuSpar, trazodone, Zyprexa, Depakote.  Also has history of major depressive disorder.  He was alert, awake, follows commands, knows that he is in hospital.  Left lower extremity pain, weakness: Imagings unremarkable.  Currently stable  Seizure disorder: Generalized convulsion was noted by his roommate prior to presentation.  No history of seizure disorder and was not taking any AEDs in the past.  Could be withdrawal seizure  or related to substance abuse.  UDS positive for benzodiazepine.  EEG unremarkable.  Neurology was following.  Currently on Keppra  AKI, rhabdomyolysis: Presented with severe AKI.  Nephrology was consulted.  He was prepared for dialysis and hemodialysis catheter was put but he never needed.  Kidney function gradually improved and has normalized.  Rhabdomyolysis resolved.  Aspiration pneumonia: Completed a course of Zosyn  Elevated liver enzymes: Most likely associated with rhabdomyolysis.  Currently normalized  Debility/deconditioning: Prolonged hospitalization due to persistent encephalopathy, and abnormal kidney function.  He lives in group home.  PT/OT recommended skilled nursing facility on discharge.  Prolonged hospitalization due to lack of placement option          DVT prophylaxis:[place] heparin Code Status: Full Family Communication: None at the bedside Patient status:Inpatient  Dispo: The patient is from: Group home              Anticipated d/c is to: SNF              Anticipated d/c date is: Not sure  Consultants: PCCM, neurology, nephrology  Procedures: Dialysis catheter placement  Antimicrobials:  Anti-infectives (From admission, onward)    Start     Dose/Rate Route Frequency Ordered Stop   01/07/21 1500  vancomycin (VANCOREADY) IVPB 750 mg/150 mL  Status:  Discontinued        750 mg 150 mL/hr over 60 Minutes Intravenous Every 24 hours 01/06/21 1326 01/07/21 0802   01/07/21 0802  vancomycin variable dose per unstable renal function (pharmacist dosing)  Status:  Discontinued         Does not apply See admin instructions 01/07/21 0802 01/07/21 1000   01/06/21 2200  cefTRIAXone (  ROCEPHIN) 2 g in sodium chloride 0.9 % 100 mL IVPB  Status:  Discontinued        2 g 200 mL/hr over 30 Minutes Intravenous Every 12 hours 01/06/21 1635 01/07/21 1000   01/06/21 1600  ceFEPIme (MAXIPIME) 2 g in sodium chloride 0.9 % 100 mL IVPB  Status:  Discontinued        2 g 200 mL/hr over 30  Minutes Intravenous Every 24 hours 01/05/21 1612 01/05/21 1721   01/06/21 1415  vancomycin (VANCOREADY) IVPB 1500 mg/300 mL        1,500 mg 150 mL/hr over 120 Minutes Intravenous  Once 01/06/21 1326 01/06/21 1931   01/05/21 1845  Ampicillin-Sulbactam (UNASYN) 3 g in sodium chloride 0.9 % 100 mL IVPB  Status:  Discontinued        3 g 200 mL/hr over 30 Minutes Intravenous Every 12 hours 01/05/21 1754 01/06/21 1635   01/05/21 1645  vancomycin (VANCOCIN) IVPB 1000 mg/200 mL premix        1,000 mg 200 mL/hr over 60 Minutes Intravenous  Once 01/05/21 1513 01/05/21 1752   01/05/21 1612  vancomycin variable dose per unstable renal function (pharmacist dosing)  Status:  Discontinued         Does not apply See admin instructions 01/05/21 1612 01/05/21 1721   01/05/21 1515  ceFEPIme (MAXIPIME) 2 g in sodium chloride 0.9 % 100 mL IVPB        2 g 200 mL/hr over 30 Minutes Intravenous  Once 01/05/21 1513 01/05/21 1648       Subjective: Patient seen and examined at the bedside this morning.  Hemodynamically stable.  Looks very comfortable, inside the cage.  Talked well, cooperative, allowed me for examination.  Does not look any any kind of distress.  Knows that he is in the hospital.  Noted to have some nonpurposeful movements of lower extremities  Objective: Vitals:   02/12/21 1944 02/12/21 2353 02/13/21 0343 02/13/21 0740  BP: (!) 115/48 119/78 (!) 121/91 (!) 108/96  Pulse: 90 81 91 74  Resp: 18 18 20 18   Temp: 97.8 F (36.6 C) 98 F (36.7 C) 98.4 F (36.9 C) 97.7 F (36.5 C)  TempSrc: Oral Oral Oral Oral  SpO2: 100% 98% 100% 100%  Weight:      Height:        Intake/Output Summary (Last 24 hours) at 02/13/2021 0832 Last data filed at 02/12/2021 2230 Gross per 24 hour  Intake 486 ml  Output --  Net 486 ml   Filed Weights   02/02/21 0500 02/03/21 2357 02/04/21 0412  Weight: 60.5 kg 59.5 kg 59.5 kg    Examination:  General exam: Overall comfortable, not in distress,  deconditioned HEENT: PERRL Respiratory system:  no wheezes or crackles  Cardiovascular system: S1 & S2 heard, RRR.  Gastrointestinal system: Abdomen is nondistended, soft and nontender. Central nervous system: Alert, awake, follows commands and oriented to place Extremities: No edema, no clubbing ,no cyanosis Skin: No rashes, no ulcers,no icterus      Data Reviewed: I have personally reviewed following labs and imaging studies  CBC: Recent Labs  Lab 02/13/21 0133  WBC 6.6  NEUTROABS 2.4  HGB 12.1*  HCT 33.7*  MCV 91.6  PLT 246   Basic Metabolic Panel: Recent Labs  Lab 02/13/21 0133  NA 138  K 3.9  CL 102  CO2 28  GLUCOSE 98  BUN 21*  CREATININE 0.75  CALCIUM 10.0   GFR: Estimated Creatinine Clearance: 87.8  mL/min (by C-G formula based on SCr of 0.75 mg/dL). Liver Function Tests: No results for input(s): AST, ALT, ALKPHOS, BILITOT, PROT, ALBUMIN in the last 168 hours. No results for input(s): LIPASE, AMYLASE in the last 168 hours. No results for input(s): AMMONIA in the last 168 hours. Coagulation Profile: No results for input(s): INR, PROTIME in the last 168 hours. Cardiac Enzymes: No results for input(s): CKTOTAL, CKMB, CKMBINDEX, TROPONINI in the last 168 hours. BNP (last 3 results) No results for input(s): PROBNP in the last 8760 hours. HbA1C: No results for input(s): HGBA1C in the last 72 hours. CBG: No results for input(s): GLUCAP in the last 168 hours. Lipid Profile: No results for input(s): CHOL, HDL, LDLCALC, TRIG, CHOLHDL, LDLDIRECT in the last 72 hours. Thyroid Function Tests: No results for input(s): TSH, T4TOTAL, FREET4, T3FREE, THYROIDAB in the last 72 hours. Anemia Panel: No results for input(s): VITAMINB12, FOLATE, FERRITIN, TIBC, IRON, RETICCTPCT in the last 72 hours. Sepsis Labs: No results for input(s): PROCALCITON, LATICACIDVEN in the last 168 hours.  No results found for this or any previous visit (from the past 240 hour(s)).        Radiology Studies: No results found.      Scheduled Meds:  amLODipine  2.5 mg Oral Daily   aspirin  325 mg Oral Daily   Or   aspirin  300 mg Rectal Daily   busPIRone  10 mg Oral BID   docusate sodium  100 mg Oral BID   feeding supplement  237 mL Oral BID BM   FLUoxetine  40 mg Oral Daily   folic acid  1 mg Oral Daily   gabapentin  600 mg Oral TID   heparin injection (subcutaneous)  5,000 Units Subcutaneous Q8H   levETIRAcetam  500 mg Oral BID   lidocaine  1 patch Transdermal Q24H   mouth rinse  15 mL Mouth Rinse BID   nicotine  21 mg Transdermal Daily   OLANZapine  5 mg Oral Daily   OLANZapine  7.5 mg Oral QHS   polyethylene glycol  17 g Oral Daily   sodium chloride flush  3 mL Intravenous Q12H   traZODone  100 mg Oral QHS   valproic acid  500 mg Oral BID   Continuous Infusions:  sodium chloride 10 mL/hr at 02/04/21 1457     LOS: 39 days       Burnadette PopAmrit Aliea Bobe, MD [facility] P1/28/2023, 8:32 AM

## 2021-02-14 NOTE — Progress Notes (Signed)
PROGRESS NOTE    Steve [name]  BJS:[id number] DOB: 1965/04/28 DOA: 01/05/2021 PCP: No primary care provider on file.   Chief Complain: Altered mental status  Brief Narrative: Patient is a 56 year old male with history of depression, anxiety who came from group home to the ED here on 12/20 for evaluation of altered mental status, combativeness.  On presentation, lab work showed AKI with creatinine of 3.2, rhabdomyolysis, abnormal LFTs, elevated WBC count, elevated lactate.  CT head negative for acute bleed or infarct but showed prominent fluid space at the left cerebellopontine angle raising possibility of epidermoid arachnoid cyst.  Patient was initially admitted to ICU.  EEG did not show any seizure.  He was also placed hemodialysis catheter due to concern for worsening renal function but did not require hemodialysis as creatinine started improving.  He was transferred to [facility] service on 12/31.  Prolonged hospitalization due to difficulty in placement.  Assessment & Plan:   Principal Problem:   Acute metabolic encephalopathy Active Problems:   Severe sepsis without septic shock ([name])   Fever   Pressure injury of skin   Alcoholic dementia ([name])   Wernicke's encephalopathy   Physical deconditioning   Right ventricular dysfunction   Cognitive impairment: Has history of chronic alcoholism.  There was suspicion for alcohol associated dementia /Wernicke's encephalopathy.  Continues to have flat affect, paranoid type behaviors, cognitive impairment.  Currently on Prozac, BuSpar, trazodone, Zyprexa, Depakote.  Also has history of major depressive disorder.  He was alert, awake, follows commands, knows that he is in hospital.  Knew that current month is January.  Left lower extremity pain, weakness: Imagings unremarkable.  Currently stable  Seizure disorder: Generalized convulsion was noted by his roommate prior to presentation.  No history of seizure disorder and was not taking any AEDs in the  past.  Could be withdrawal seizure or related to substance abuse.  UDS positive for benzodiazepine.  EEG unremarkable.  Neurology was following.  Currently on Keppra  AKI, rhabdomyolysis: Presented with severe AKI.  Nephrology was consulted.  He was prepared for dialysis and hemodialysis catheter was put but he never needed.  Kidney function gradually improved and has normalized.  Rhabdomyolysis resolved.  Aspiration pneumonia: Completed a course of Zosyn  Elevated liver enzymes: Most likely associated with rhabdomyolysis.  Currently normalized  Debility/deconditioning: Prolonged hospitalization due to persistent encephalopathy, and abnormal kidney function.  He lives in group home.  PT/OT recommended skilled nursing facility on discharge.  Prolonged hospitalization due to lack of placement option          DVT prophylaxis:[place] heparin Code Status: Full Family Communication: None at the bedside Patient status:Inpatient  Dispo: The patient is from: Group home              Anticipated d/c is to: SNF              Anticipated d/c date is: Not sure  Consultants: PCCM, neurology, nephrology  Procedures: Dialysis catheter placement  Antimicrobials:  Anti-infectives (From admission, onward)    Start     Dose/Rate Route Frequency Ordered Stop   01/07/21 1500  vancomycin (VANCOREADY) IVPB 750 mg/150 mL  Status:  Discontinued        750 mg 150 mL/hr over 60 Minutes Intravenous Every 24 hours 01/06/21 1326 01/07/21 0802   01/07/21 0802  vancomycin variable dose per unstable renal function (pharmacist dosing)  Status:  Discontinued         Does not apply See admin instructions 01/07/21 0802 01/07/21  1000   01/06/21 2200  cefTRIAXone (ROCEPHIN) 2 g in sodium chloride 0.9 % 100 mL IVPB  Status:  Discontinued        2 g 200 mL/hr over 30 Minutes Intravenous Every 12 hours 01/06/21 1635 01/07/21 1000   01/06/21 1600  ceFEPIme (MAXIPIME) 2 g in sodium chloride 0.9 % 100 mL IVPB  Status:   Discontinued        2 g 200 mL/hr over 30 Minutes Intravenous Every 24 hours 01/05/21 1612 01/05/21 1721   01/06/21 1415  vancomycin (VANCOREADY) IVPB 1500 mg/300 mL        1,500 mg 150 mL/hr over 120 Minutes Intravenous  Once 01/06/21 1326 01/06/21 1931   01/05/21 1845  Ampicillin-Sulbactam (UNASYN) 3 g in sodium chloride 0.9 % 100 mL IVPB  Status:  Discontinued        3 g 200 mL/hr over 30 Minutes Intravenous Every 12 hours 01/05/21 1754 01/06/21 1635   01/05/21 1645  vancomycin (VANCOCIN) IVPB 1000 mg/200 mL premix        1,000 mg 200 mL/hr over 60 Minutes Intravenous  Once 01/05/21 1513 01/05/21 1752   01/05/21 1612  vancomycin variable dose per unstable renal function (pharmacist dosing)  Status:  Discontinued         Does not apply See admin instructions 01/05/21 1612 01/05/21 1721   01/05/21 1515  ceFEPIme (MAXIPIME) 2 g in sodium chloride 0.9 % 100 mL IVPB        2 g 200 mL/hr over 30 Minutes Intravenous  Once 01/05/21 1513 01/05/21 1648       Subjective: Patient seen and examined at bedside this morning.  Hemodynamically stable.  Inside the cage.  Denies any complaints today.  Knew that current month is January.  Objective: Vitals:   02/13/21 1958 02/13/21 2339 02/14/21 0414 02/14/21 0801  BP: (!) 132/91 126/80 (!) 118/92 109/75  Pulse: 100 92 90 80  Resp: 20 18 20 12   Temp: 97.8 F (36.6 C) 98.8 F (37.1 C) 97.9 F (36.6 C) 98.5 F (36.9 C)  TempSrc: Oral Oral Oral Oral  SpO2: 100% 100% 100% 100%  Weight:      Height:        Intake/Output Summary (Last 24 hours) at 02/14/2021 1058 Last data filed at 02/13/2021 2232 Gross per 24 hour  Intake 303 ml  Output --  Net 303 ml   Filed Weights   02/02/21 0500 02/03/21 2357 02/04/21 0412  Weight: 60.5 kg 59.5 kg 59.5 kg    Examination:   General exam: Overall comfortable, not in distress, deconditioned HEENT: PERRL Respiratory system:  no wheezes or crackles  Cardiovascular system: S1 & S2 heard, RRR.   Gastrointestinal system: Abdomen is nondistended, soft and nontender. Central nervous system: Alert and awake, follows commands, oriented to place Extremities: No edema, no clubbing ,no cyanosis Skin: No rashes, no ulcers,no icterus    Data Reviewed: I have personally reviewed following labs and imaging studies  CBC: Recent Labs  Lab 02/13/21 0133  WBC 6.6  NEUTROABS 2.4  HGB 12.1*  HCT 33.7*  MCV 91.6  PLT 246   Basic Metabolic Panel: Recent Labs  Lab 02/13/21 0133  NA 138  K 3.9  CL 102  CO2 28  GLUCOSE 98  BUN 21*  CREATININE 0.75  CALCIUM 10.0   GFR: Estimated Creatinine Clearance: 87.8 mL/min (by C-G formula based on SCr of 0.75 mg/dL). Liver Function Tests: No results for input(s): AST, ALT, ALKPHOS, BILITOT, PROT,  ALBUMIN in the last 168 hours. No results for input(s): LIPASE, AMYLASE in the last 168 hours. No results for input(s): AMMONIA in the last 168 hours. Coagulation Profile: No results for input(s): INR, PROTIME in the last 168 hours. Cardiac Enzymes: No results for input(s): CKTOTAL, CKMB, CKMBINDEX, TROPONINI in the last 168 hours. BNP (last 3 results) No results for input(s): PROBNP in the last 8760 hours. HbA1C: No results for input(s): HGBA1C in the last 72 hours. CBG: No results for input(s): GLUCAP in the last 168 hours. Lipid Profile: No results for input(s): CHOL, HDL, LDLCALC, TRIG, CHOLHDL, LDLDIRECT in the last 72 hours. Thyroid Function Tests: No results for input(s): TSH, T4TOTAL, FREET4, T3FREE, THYROIDAB in the last 72 hours. Anemia Panel: No results for input(s): VITAMINB12, FOLATE, FERRITIN, TIBC, IRON, RETICCTPCT in the last 72 hours. Sepsis Labs: No results for input(s): PROCALCITON, LATICACIDVEN in the last 168 hours.  No results found for this or any previous visit (from the past 240 hour(s)).       Radiology Studies: No results found.      Scheduled Meds:  amLODipine  2.5 mg Oral Daily   aspirin  325 mg  Oral Daily   Or   aspirin  300 mg Rectal Daily   busPIRone  10 mg Oral BID   docusate sodium  100 mg Oral BID   feeding supplement  237 mL Oral BID BM   FLUoxetine  40 mg Oral Daily   folic acid  1 mg Oral Daily   gabapentin  600 mg Oral TID   heparin injection (subcutaneous)  5,000 Units Subcutaneous Q8H   levETIRAcetam  500 mg Oral BID   lidocaine  1 patch Transdermal Q24H   mouth rinse  15 mL Mouth Rinse BID   nicotine  21 mg Transdermal Daily   OLANZapine  5 mg Oral Daily   OLANZapine  7.5 mg Oral QHS   polyethylene glycol  17 g Oral Daily   sodium chloride flush  3 mL Intravenous Q12H   traZODone  100 mg Oral QHS   valproic acid  500 mg Oral BID   Continuous Infusions:  sodium chloride 10 mL/hr at 02/04/21 1457     LOS: 40 days       Burnadette Pop, MD [facility] P1/29/2023, 10:58 AM

## 2021-02-15 NOTE — Progress Notes (Signed)
Physical Therapy Treatment Patient Details Name: Steve [name] MRN: [id number] DOB: June 22, 1965 Today's Date: 02/15/2021   History of Present Illness Pt is a 56 y.o. male admitted from group home for convalescence, combativeness and altered sensorium on 01/05/21 with witnessed seizure, AMS. 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 EEG unremarkable. LP on 12/21 showed staph in CSF, felt to be contaminant. Workup for AKI, rhabdomyolysis. Course complicated by c/o L foot pain; imaging negative for acute injury. Pt remains hospitalized due to difficult to place. Pt with psych consult who felt no need for inpt psych. No PMH in chart.    PT Comments    Pt making gradual progress.  Improved ability to follow commands this session but did need frequent mod-max cues for safety, little carryover.  Continue to recommend SNF due to level of assist required and cognition.     Recommendations for follow up therapy are one component of a multi-disciplinary discharge planning process, led by the attending physician.  Recommendations may be updated based on patient status, additional functional criteria and insurance authorization.  Follow Up Recommendations  Skilled nursing-short term rehab (<3 hours/day)     Assistance Recommended at Discharge Frequent or constant Supervision/Assistance  Patient can return home with the following A little help with walking and/or transfers;Assistance with cooking/housework;A little help with bathing/dressing/bathroom;Direct supervision/assist for financial management;Direct supervision/assist for medications management;Assist for transportation   Equipment Recommendations  Rolling walker (2 wheels)    Recommendations for Other Services       Precautions / Restrictions Precautions Precautions: Fall Precaution Comments: h/o L foot pain; in Posey  bed Restrictions LLE Weight Bearing: Weight bearing as tolerated Other Position/Activity Restrictions: CAM boot not needed per ortho note     Mobility  Bed Mobility Overal bed mobility: Needs Assistance Bed Mobility: Supine to Sit, Sit to Supine     Supine to sit: Supervision Sit to supine: Supervision        Transfers Overall transfer level: Needs assistance Equipment used: Rolling walker (2 wheels) Transfers: Sit to/from Stand Sit to Stand: Min assist           General transfer comment: From bed x 2 and from toilet with min A to steady and cues for safety.  Did require mod A to sit down to toilet (guiding correct direction, controlled descent)    Ambulation/Gait Ambulation/Gait assistance: Min assist, Mod assist Gait Distance (Feet): 300 Feet Assistive device: Rolling walker (2 wheels) Gait Pattern/deviations: Decreased stride length, Step-to pattern, Step-through pattern, Antalgic Gait velocity: decreased     General Gait Details: Antalgic on L side - seemed to improve with gait; Pt frequently drifting to R side and lifting R wheels on RW requiring assist min-mod A to stabilize. Also, assist and repeated verbal cues to avoid objects on R side.  Able to participate in minimal cognitive task with walking including finding/reading room numbers on both sides   Stairs             Wheelchair Mobility    Modified Rankin (Stroke Patients Only)       Balance Overall balance assessment: Needs assistance Sitting-balance support: Feet supported Sitting balance-Leahy Scale: Good     Standing balance support: Bilateral upper extremity supported, No upper extremity supported Standing balance-Leahy Scale: Poor Standing balance comment: Requiring RW and min-mod A to walk; static stand without UE support but needing min guard-min A  Cognition Arousal/Alertness: Awake/alert Behavior During Therapy: Flat affect Overall  Cognitive Status: No family/caregiver present to determine baseline cognitive functioning Area of Impairment: Orientation, Attention, Memory, Following commands, Problem solving, Safety/judgement                 Orientation Level: Disoriented to, Place, Time, Situation Current Attention Level: Sustained Memory: Decreased short-term memory Following Commands: Follows one step commands with increased time Safety/Judgement: Decreased awareness of safety, Decreased awareness of deficits Awareness: Intellectual Problem Solving: Slow processing, Decreased initiation, Difficulty sequencing, Requires verbal cues, Requires tactile cues General Comments: required repetition        Exercises      General Comments General comments (skin integrity, edema, etc.): Frequent cues for safety.  Pt pointing to lateral L foot as area of pain - did note 2 areas of erythema/callus appearing      Pertinent Vitals/Pain Pain Assessment Pain Assessment: Faces Faces Pain Scale: Hurts a little bit Pain Location: L foot pain (points lateral foot) Pain Descriptors / Indicators: Guarding Pain Intervention(s): Limited activity within patient's tolerance, Monitored during session    Home Living                          Prior Function            PT Goals (current goals can now be found in the care plan section) Progress towards PT goals: Progressing toward goals    Frequency    Min 2X/week      PT Plan Current plan remains appropriate    Co-evaluation              AM-PAC PT "6 Clicks" Mobility   Outcome Measure  Help needed turning from your back to your side while in a flat bed without using bedrails?: None Help needed moving from lying on your back to sitting on the side of a flat bed without using bedrails?: A Little Help needed moving to and from a bed to a chair (including a wheelchair)?: A Lot (cues) Help needed standing up from a chair using your arms (e.g.,  wheelchair or bedside chair)?: A Lot Help needed to walk in hospital room?: A Lot Help needed climbing 3-5 steps with a railing? : A Lot 6 Click Score: 15    End of Session Equipment Utilized During Treatment: Gait belt Activity Tolerance: Patient tolerated treatment well Patient left: in bed;with call bell/phone within reach (in posey bed) Nurse Communication: Mobility status PT Visit Diagnosis: Other abnormalities of gait and mobility (R26.89);Other symptoms and signs involving the nervous system (R29.898);Pain Pain - Right/Left: Left Pain - part of body: Ankle and joints of foot     Time: 1645-1701 PT Time Calculation (min) (ACUTE ONLY): 16 min  Charges:  $Gait Training: 8-22 mins                     Anise Salvo, PT Acute Rehab Services Pager [phone] [facility] [phone]    Rayetta Humphrey 02/15/2021, 5:40 PM

## 2021-02-15 NOTE — Plan of Care (Signed)
  Problem: Health Behavior/Discharge Planning: Goal: Ability to manage health-related needs will improve Outcome: Progressing   

## 2021-02-15 NOTE — Progress Notes (Signed)
[facility] PROGRESS NOTE  Steve [name] EUM:[id number] DOB: 20-Mar-1965 DOA: 01/05/2021 PCP: No primary care provider on file.  Status: Remains inpatient appropriate because:  Unsafe discharge Not medically stable secondary to ongoing physical therapy needs and severe cognitive impairment Anticipate will be able to discontinue Posey enclosure bed on Monday 1/30  Barriers to discharge: Social: Needs SNF but no funding in place-does not have a guardian but son willing to assist with management of patient regarding approval of procedures, signing appropriate paperwork, etc.  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 male with history of major depression, anxiety who was in a residential ETOH treatment center for last 5 months. He was brought to the ED on 12/20 from a group home for convalescence, combativeness and altered sensorium.  Per collateral history, patient was not acting typical for last few days. In the ED, patient was confused, combative Labs showed AKI with creatinine of 3.27, rhabdomyolysis, abnormal LFTs, elevated WBC count at 23.7 and lactic acid level at 7.6. 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  IV fluid resuscitation started Patient was admitted to ICU. 12/21, underwent LP which showed staph in CSF, felt to be contaminant.  EEG did not show any seizures 12/24, HD catheter was placed but patient did not require HD as creatinine started to improve 12/31, with clinical  improvement, patient was transferred out of ICU to [facility]. 1/18 Cognitive evaluations: 2 on the SLUMS, a 9 on the MMSE, and a 0 on medi-cog and short blessed test  Subjective: Sleeping soundly  Objective: Vitals:   02/15/21 0006 02/15/21 0411  BP: 121/88 (!) 117/96  Pulse: 92 86  Resp:  18  Temp: 98 F (36.7 C) 98.2 F (36.8 C)  SpO2: 100% 100%    Intake/Output Summary (Last 24 hours) at 02/15/2021 0740 Last data filed at 02/14/2021 1800 Gross per 24 hour  Intake 720 ml  Output --  Net 720 ml   Filed Weights   02/02/21 0500 02/03/21 2357 02/04/21 0412  Weight: 60.5 kg 59.5 kg 59.5 kg    Exam:  Constitutional: NAD, calm and sleeping soundly Respiratory: clear to auscultation bilaterally, Normal respiratory effort. RA Cardiovascular: Regular pulse, no peripheral edema.  Normotensive.  S1-S2 Abdomen: no tenderness, no masses palpated. Bowel sounds positive. LBM 1/29 Neurologic: Sleeping soundly but at baseline CN 2-12 grossly intact. Sensation intact, Strength 3+-4/5 x all 4 extremities.  Psychiatric: Sleeping soundly   Assessment/Plan: Acute problems: Significant cognitive impairment 2/2 alcoholic dementia/history of EtOH abuse x30+ years Patient continues with flat affect and demonstrates reported paranoid type behaviors and is having difficulty cognitively with performing executive functioning skills-cognitive screening revealed significant cognitive deficits Continue Prozac, BuSpar, trazodone-has known history of major depressive disorder Seroquel ineffective - transitioned to Zyprexa Continue continue Depakote.  Dosage increased on 1/26.  LFTs and platelets also normal on 1/27 Suspect this will be his new baseline noting no significant improvement in the 29 days he has been hospitalized  Left lower extremity pain, weakness. Work-up including imaging unremarkable Does have a history of smoking therefore could benefit from PVD  evaluation with arterial duplex and  ABI Appears to have resolved noting patient no longer complaining of pain   Seizure Generalized convulsion was noted by his roommate prior to presentation. No apparent prior history of seizure disorder noting patient was not on AEDs prior to admission Unclear if withdrawal seizure or related to other substance-UDS positive for benzodiazepines and was probably obtained after was administered this class of medications for acute seizure activity EEG unremarkable.  Neurology consult appreciated.  Vimpat which was utilized during AKI transitioned back to Keppra on 1/20   Acute renal failure secondary to rhabdomyolysis Acute metabolic acidosis His creatinine continue to worsen to peak at 7.48 on 12/26.  Nephrology consult was obtained.  He was prepped for dialysis, had a HD catheter placed but never needed dialysis.  Creatinine gradually improved to normal.  Serum bicarb level improved as well. Nephrology consult appreciated   Rhabdomyolysis Resolved   Ruled out upper extremity DVT Per critical care note, hemodialysis cath had a clot on it when it was removed.  UE duplex  12/31: no DVT.     Aspiration pneumonia Completed a course of Zosyn   Acute liver injury His AST and ALT were significantly elevated to over 700s on admission.  Gradually improved to normal.   Evidence of right ventricular dysfunction and recent echocardiogram Echo 1/2 with EF 60 to 65%.  Cannot rule out a small PFO. Not clinically significant as patient has no symptoms related to it. Continue to monitor as an outpatient.       Scheduled Meds:  amLODipine  2.5 mg Oral Daily   aspirin  325 mg Oral Daily   Or   aspirin  300 mg Rectal Daily   busPIRone  10 mg Oral BID   docusate sodium  100 mg Oral BID   feeding supplement  237 mL Oral BID BM   FLUoxetine  40 mg Oral Daily   folic acid  1 mg Oral Daily   gabapentin  600 mg Oral TID   heparin injection (subcutaneous)  5,000 Units Subcutaneous Q8H    levETIRAcetam  500 mg Oral BID   lidocaine  1 patch Transdermal Q24H   mouth rinse  15 mL Mouth Rinse BID   nicotine  21 mg Transdermal Daily   OLANZapine  5 mg Oral Daily   OLANZapine  7.5 mg Oral QHS   polyethylene glycol  17 g Oral Daily   traZODone  100 mg Oral QHS   valproic acid  500 mg Oral BID   Continuous Infusions:  sodium chloride 10 mL/hr at 02/04/21 1457    Principal Problem:   Acute metabolic encephalopathy Active Problems:   Severe sepsis without septic shock ([name])   Fever   Pressure injury of skin   Alcoholic dementia ([name])   Wernicke's encephalopathy   Physical deconditioning   Right ventricular dysfunction   Consultants: Nephrology Psychiatry  Procedures: EEG Echocardiogram TDC  Antibiotics: Unasyn x1 dose, cefepime x1 dose, ceftriaxone x1 dose, vancomycin x2 doses   Time spent: 15 minutes    Junious Silk ANP  [facility] 7 am - 330 pm/M-F for direct patient care and secure chat Please refer to Amion for contact info 41  days

## 2021-02-16 NOTE — Progress Notes (Signed)
Occupational Therapy Treatment Patient Details Name: Steve [name] MRN: [id number] DOB: 05-20-1965 Today's Date: 02/16/2021   History of present illness Pt is a 56 y.o. male admitted from group home for convalescence, combativeness and altered sensorium on 01/05/21 with witnessed seizure, AMS. 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 EEG unremarkable. LP on 12/21 showed staph in CSF, felt to be contaminant. Workup for AKI, rhabdomyolysis. Course complicated by c/o L foot pain; imaging negative for acute injury. Pt remains hospitalized due to difficult to place. Pt with psych consult who felt no need for inpt psych. No PMH in chart.   OT comments  Steve [name] is working towards his goals. He is moving well with min guard - min A with a RW but continues to require maximal cues for safety, initiation, problem solving and sustaining tasks. BADLs require mod-max assist. He continues to benefit from OT acutely.    Recommendations for follow up therapy are one component of a multi-disciplinary discharge planning process, led by the attending physician.  Recommendations may be updated based on patient status, additional functional criteria and insurance authorization.    Follow Up Recommendations  Skilled nursing-short term rehab (<3 hours/day)    Assistance Recommended at Discharge Frequent or constant Supervision/Assistance  Patient can return home with the following  A little help with walking and/or transfers;A little help with bathing/dressing/bathroom;Assistance with cooking/housework;Assist for transportation;Help with stairs or ramp for entrance   Equipment Recommendations  None recommended by OT       Precautions / Restrictions Precautions Precautions: Fall Precaution Comments: h/o L foot pain; in Posey bed Restrictions Weight Bearing Restrictions: No       Mobility  Bed Mobility Overal bed mobility: Needs Assistance Bed Mobility: Supine to Sit, Sit to Supine     Supine to sit: Supervision Sit to supine: Supervision   General bed mobility comments: with cues for initiation    Transfers Overall transfer level: Needs assistance Equipment used: Rolling walker (2 wheels) Transfers: Sit to/from Stand Sit to Stand: Min assist           General transfer comment: pt unsteady upon standing     Balance Overall balance assessment: Needs assistance Sitting-balance support: Feet supported Sitting balance-Leahy Scale: Good     Standing balance support: Single extremity supported, During functional activity Standing balance-Leahy Scale: Fair                             ADL either performed or assessed with clinical judgement   ADL Overall ADL's : Needs assistance/impaired     Grooming: Wash/dry hands;Moderate assistance;Standing Grooming Details (indicate cue type and reason): for multimodial cues and mangement of items/environment                 Toilet Transfer: Minimal assistance;Ambulation;Rolling walker (2 wheels);Regular Teacher, adult education Details (indicate cue type and reason): for verbal cues and assist to initiate         Functional mobility during ADLs: Minimal assistance;Rolling walker (2 wheels) General ADL Comments: max verbal cues and repetition for all tasks assessed.    Extremity/Trunk Assessment Upper Extremity Assessment Upper Extremity Assessment: Overall WFL for tasks assessed;Generalized weakness   Lower Extremity Assessment Lower Extremity Assessment: Defer to PT evaluation        Vision   Vision Assessment?: No apparent visual deficits   Perception Perception Perception:  Within Functional Limits   Praxis Praxis Praxis: Intact    Cognition Arousal/Alertness: Awake/alert Behavior During Therapy: Flat affect Overall Cognitive Status: No family/caregiver present to determine  baseline cognitive functioning Area of Impairment: Orientation, Attention, Memory, Following commands, Problem solving, Safety/judgement       Orientation Level: Disoriented to, Time, Situation Current Attention Level: Sustained Memory: Decreased short-term memory Following Commands: Follows one step commands with increased time Safety/Judgement: Decreased awareness of safety, Decreased awareness of deficits Awareness: Intellectual Problem Solving: Slow processing, Decreased initiation, Difficulty sequencing, Requires verbal cues, Requires tactile cues General Comments: required repetition, states his name and "[facility]"              General Comments vss on ra, RN present passing meds at the end of the session    Pertinent Vitals/ Pain       Pain Assessment Pain Assessment: Faces Faces Pain Scale: Hurts a little bit Pain Location: L foot with ambulation, limping Pain Descriptors / Indicators: Guarding Pain Intervention(s): Limited activity within patient's tolerance, Monitored during session   Frequency  Min 2X/week        Progress Toward Goals  OT Goals(current goals can now be found in the care plan section)  Progress towards OT goals: Progressing toward goals  Acute Rehab OT Goals Patient Stated Goal: did not state OT Goal Formulation: With patient Time For Goal Achievement: 02/26/21 Potential to Achieve Goals: Fair ADL Goals Pt Will Perform Grooming: with modified independence;standing Pt Will Perform Lower Body Bathing: with modified independence;sit to/from stand Pt Will Perform Lower Body Dressing: with modified independence;sit to/from stand Pt Will Transfer to Toilet: with modified independence;ambulating Pt Will Perform Toileting - Clothing Manipulation and hygiene: with modified independence;sit to/from stand Additional ADL Goal #1: Pt will demonstrate decreased pain to LEs by tolerating light touch as needed for LE ADLs in order to increase independence  with and tolerance for self care.  Plan Discharge plan remains appropriate       AM-PAC OT "6 Clicks" Daily Activity     Outcome Measure   Help from another person eating meals?: None Help from another person taking care of personal grooming?: A Little Help from another person toileting, which includes using toliet, bedpan, or urinal?: A Lot Help from another person bathing (including washing, rinsing, drying)?: A Lot Help from another person to put on and taking off regular upper body clothing?: A Little Help from another person to put on and taking off regular lower body clothing?: A Lot 6 Click Score: 16    End of Session Equipment Utilized During Treatment: Gait belt;Rolling walker (2 wheels)  OT Visit Diagnosis: Unsteadiness on feet (R26.81);Muscle weakness (generalized) (M62.81);Dizziness and giddiness (R42) Pain - Right/Left: Left Pain - part of body: Leg;Ankle and joints of foot   Activity Tolerance Patient tolerated treatment well   Patient Left in bed;with call bell/phone within reach;with nursing/sitter in room   Nurse Communication Mobility status        Time: 9147-8295 OT Time Calculation (min): 20 min  Charges: OT General Charges $OT Visit: 1 Visit OT Treatments $Therapeutic Activity: 8-22 mins   Lauralee Waters A Hubert Raatz 02/16/2021, 4:28 PM

## 2021-02-16 NOTE — Plan of Care (Signed)
  Problem: Health Behavior/Discharge Planning: Goal: Ability to manage health-related needs will improve Outcome: Progressing   

## 2021-02-16 NOTE — Progress Notes (Signed)
[facility] PROGRESS NOTE  Steve [name] BHA:[id number] DOB: 04/07/1965 DOA: 01/05/2021 PCP: No primary care provider on file.  Status: Remains inpatient appropriate because:  Unsafe discharge Not medically stable secondary to ongoing physical therapy needs and severe cognitive impairment Anticipate will be able to discontinue Posey enclosure bed on Monday 1/30  Barriers to discharge: Social: Needs SNF but no funding in place-does not have a guardian but son willing to assist with management of patient regarding approval of procedures, signing appropriate paperwork, etc.  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 male with history of major depression, anxiety who was in a residential ETOH treatment center for last 5 months. He was brought to the ED on 12/20 from a group home for convalescence, combativeness and altered sensorium.  Per collateral history, patient was not acting typical for last few days. In the ED, patient was confused, combative Labs showed AKI with creatinine of 3.27, rhabdomyolysis, abnormal LFTs, elevated WBC count at 23.7 and lactic acid level at 7.6. 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  IV fluid resuscitation started Patient was admitted to ICU. 12/21, underwent LP which showed staph in CSF, felt to be contaminant.  EEG did not show any seizures 12/24, HD catheter was placed but patient did not require HD as creatinine started to improve 12/31, with clinical  improvement, patient was transferred out of ICU to [facility]. 1/18 Cognitive evaluations: 2 on the SLUMS, a 9 on the MMSE, and a 0 on medi-cog and short blessed test  Subjective: Patient awake and restless in the bed.  Had pulled off hospital gown and was unable to replace appropriately.  Remains confused.  Objective: Vitals:   02/15/21 2356 02/16/21 0505  BP: (!) 116/93 100/80  Pulse: 89 74  Resp: 18 16  Temp: 98.1 F (36.7 C) 97.7 F (36.5 C)  SpO2: 100% 100%    Intake/Output Summary (Last 24 hours) at 02/16/2021 0728 Last data filed at 02/15/2021 1900 Gross per 24 hour  Intake 1200 ml  Output 1 ml  Net 1199 ml   Filed Weights   02/02/21 0500 02/03/21 2357 02/04/21 0412  Weight: 60.5 kg 59.5 kg 59.5 kg    Exam:  Constitutional: NAD, restless but not agitated Respiratory: Posterior lung sounds clear, Normal respiratory effort. RA Cardiovascular: Regular pulse, no peripheral edema.  Normotensive.  S1-S2 Abdomen: no tenderness, no masses palpated. Bowel sounds positive. LBM 1/30 Neurologic: Sleeping soundly but at baseline CN 2-12 grossly intact. Sensation intact, Strength 3+-4/5 x all 4 extremities.  Psychiatric: Awake and mildly restless.  Remains confused.  Stated he was in [place] at a rehabilitation center and the year was 2022.   Assessment/Plan: Acute problems: Significant cognitive impairment 2/2 alcoholic dementia/history of EtOH abuse x30+ years Patient continues with flat affect and demonstrates reported paranoid type behaviors and is having difficulty cognitively with performing executive functioning skills-cognitive screening revealed significant cognitive deficits Continue Prozac, BuSpar, trazodone-has known history of major depressive disorder-continue Zyprexa and Depakote for behavioral issues and cognitive impairment Follow QTC, platelets and LFTs periodically while on Zyprexa and Depakote  Left lower extremity pain, weakness. Work-up including imaging  unremarkable At this juncture appears to be resolved   Witnessed seizure prior to admission No apparent prior history of seizure disorder noting patient was not on AEDs prior to admission Unclear if withdrawal seizure or related to other substance-UDS positive for benzodiazepines and was probably obtained after was administered this class of medications for acute seizure activity EEG unremarkable.   Continue Keppra at recommendation of neurology   Acute renal failure secondary to rhabdomyolysis Acute metabolic acidosis Creatinine peaked at 7.48 on 12/26.  Nephrology consulted with initial plans to initiate dialysis including placement of [facility] but renal function gradually improved and dialysis not needed and TDC   Rhabdomyolysis Resolved   Ruled out upper extremity DVT Per critical care note, hemodialysis cath had a clot on it when it was removed.  UE duplex  12/31: no DVT.     Aspiration pneumonia Completed a course of Zosyn   Acute liver injury His AST and ALT were significantly elevated to over 700s on admission. Gradually improved to normal.   Evidence of right ventricular dysfunction and recent echocardiogram Echo 1/2 with EF 60 to 65%.  Cannot rule out a small PFO this finding determined to be clinically insignificant given the patient was asymptomatic     Scheduled Meds:  amLODipine  2.5 mg Oral Daily   aspirin  325 mg Oral Daily   Or   aspirin  300 mg Rectal Daily   busPIRone  10 mg Oral BID   docusate sodium  100 mg Oral BID   feeding supplement  237 mL Oral BID BM   FLUoxetine  40 mg Oral Daily   folic acid  1 mg Oral Daily   gabapentin  600 mg Oral TID   heparin injection (subcutaneous)  5,000 Units Subcutaneous Q8H   levETIRAcetam  500 mg Oral BID   lidocaine  1 patch Transdermal Q24H   mouth rinse  15 mL Mouth Rinse BID   nicotine  21 mg Transdermal Daily   OLANZapine  5 mg Oral Daily   OLANZapine  7.5 mg Oral QHS   polyethylene glycol  17 g Oral Daily    traZODone  100 mg Oral QHS   valproic acid  500 mg Oral BID   Continuous Infusions:  sodium chloride 10 mL/hr at 02/04/21 1457    Principal Problem:   Acute metabolic encephalopathy Active Problems:   Severe sepsis without septic shock ([name])   Fever   Pressure injury of skin   Alcoholic dementia ([name])   Wernicke's encephalopathy   Physical deconditioning   Right ventricular dysfunction   Consultants: Nephrology Psychiatry  Procedures: EEG Echocardiogram TDC  Antibiotics: Unasyn x1 dose, cefepime x1 dose, ceftriaxone x1 dose, vancomycin x2 doses   Time spent: 15 minutes    Junious Silk ANP  [facility] 7 am - 330 pm/M-F for direct patient care and secure chat Please refer to Amion for contact info 42  days

## 2021-02-17 DIAGNOSIS — E512 Wernicke's encephalopathy: Secondary | ICD-10-CM

## 2021-02-17 DIAGNOSIS — J690 Pneumonitis due to inhalation of food and vomit: Secondary | ICD-10-CM

## 2021-02-17 DIAGNOSIS — F1097 Alcohol use, unspecified with alcohol-induced persisting dementia: Secondary | ICD-10-CM

## 2021-02-17 DIAGNOSIS — N179 Acute kidney failure, unspecified: Secondary | ICD-10-CM

## 2021-02-17 DIAGNOSIS — M6282 Rhabdomyolysis: Secondary | ICD-10-CM | POA: Insufficient documentation

## 2021-02-17 DIAGNOSIS — R7401 Elevation of levels of liver transaminase levels: Secondary | ICD-10-CM

## 2021-02-17 DIAGNOSIS — R569 Unspecified convulsions: Secondary | ICD-10-CM | POA: Insufficient documentation

## 2021-02-17 DIAGNOSIS — R5381 Other malaise: Secondary | ICD-10-CM

## 2021-02-17 DIAGNOSIS — I82409 Acute embolism and thrombosis of unspecified deep veins of unspecified lower extremity: Secondary | ICD-10-CM

## 2021-02-17 MED ORDER — OLANZAPINE 2.5 MG PO TABS
7.5000 mg | ORAL_TABLET | Freq: Every day | ORAL | Status: DC
  Administered 2021-02-17 – 2021-02-27 (×11): 7.5 mg via ORAL
  Filled 2021-02-17 (×12): qty 3

## 2021-02-17 MED ORDER — OLANZAPINE 2.5 MG PO TABS
10.0000 mg | ORAL_TABLET | Freq: Every day | ORAL | Status: DC
  Administered 2021-02-17 – 2021-02-25 (×9): 10 mg via ORAL
  Filled 2021-02-17 (×10): qty 4

## 2021-02-17 NOTE — Plan of Care (Signed)
Patient's appetite was poor this evening, refusing food and fluids when offered multiple times, states he is not hungry at this time.   Problem: Education: Goal: Knowledge of General Education information will improve Description: Including pain rating scale, medication(s)/side effects and non-pharmacologic comfort measures Outcome: Progressing   Problem: Health Behavior/Discharge Planning: Goal: Ability to manage health-related needs will improve Outcome: Progressing   Problem: Clinical Measurements: Goal: Ability to maintain clinical measurements within normal limits will improve Outcome: Progressing Goal: Will remain free from infection Outcome: Progressing Goal: Diagnostic test results will improve Outcome: Progressing Goal: Respiratory complications will improve Outcome: Progressing Goal: Cardiovascular complication will be avoided Outcome: Progressing   Problem: Activity: Goal: Risk for activity intolerance will decrease Outcome: Progressing   Problem: Nutrition: Goal: Adequate nutrition will be maintained Outcome: Not Progressing   Problem: Coping: Goal: Level of anxiety will decrease Outcome: Progressing   Problem: Elimination: Goal: Will not experience complications related to bowel motility Outcome: Progressing Goal: Will not experience complications related to urinary retention Outcome: Progressing   Problem: Safety: Goal: Ability to remain free from injury will improve Outcome: Progressing   Problem: Pain Managment: Goal: General experience of comfort will improve Outcome: Progressing   Problem: Skin Integrity: Goal: Risk for impaired skin integrity will decrease Outcome: Progressing

## 2021-02-17 NOTE — Progress Notes (Signed)
Speech Language Pathology Treatment: Cognitive-Linquistic  Patient Details Name: Steve [name] MRN: [id number] DOB: Sep 12, 1965 Today's Date: 02/17/2021 Time: 1025-8527 SLP Time Calculation (min) (ACUTE ONLY): 22 min  Assessment / Plan / Recommendation Clinical Impression  Treatment focused on cognitive recovery. Patient continues to be restless and with very poor attention span, requiring moderate-max verbal and tactile cueing for sustained attention to task. During one, familiar, and self motivated task (patient requested something to eat and drink), he was able to sustain attention and problem solve through meal set up for 5 minutes independently but with moderate cueing for problem solving. Patient requiring moderate-max cues for orientation. 3-4 episodes of language of confusion noted, additionally with one episode of what appears to be confabulation vs hallucination requiring redirection. Patient continues to benefit from SLP services.    HPI HPI: 56 y.o. male brought to the ED on 12/20 from sober living group home for combativeness and altered sensorium.  Per collateral history, patient was not acting typical for last few days.  In the ED, patient was confused, combative  Labs showed AKI with creatinine of 3.27, rhabdomyolysis, abnormal LFTs, elevated WBC count at 23.7 and lactic acid level at 7.6.  CT head negative for acute bleeding or infarct but showed prominent fluid space at the left cerebral pontine angle raising possibility of epidermoid arachnoid cyst  Blood culture was sent, empiric antibiotic started.  IV fluid resuscitation started and patient was admitted to ICU.  12/21, underwent LP which showed staph in CSF, felt to be contaminant.  EEG did not show any seizures  12/24, HD catheter was placed but patient did not require HD as creatinine started to improve  12/31, with clinical improvement, patient was transferred out of ICU to [facility]. 02/03/21 SLUMS score: 2/30. PMH: major depression,  anxiety      SLP Plan  Continue with current plan of care      Recommendations for follow up therapy are one component of a multi-disciplinary discharge planning process, led by the attending physician.  Recommendations may be updated based on patient status, additional functional criteria and insurance authorization.    Recommendations                   Plan: Continue with current plan of care        [facility] MA, CCC-SLP    Steve [name]  02/17/2021, 10:48 AM

## 2021-02-17 NOTE — Plan of Care (Signed)
°  Problem: Clinical Measurements: °Goal: Will remain free from infection °Outcome: Progressing °  °Problem: Nutrition: °Goal: Adequate nutrition will be maintained °Outcome: Progressing °  °Problem: Coping: °Goal: Level of anxiety will decrease °Outcome: Progressing °  °

## 2021-02-17 NOTE — Progress Notes (Addendum)
[facility] PROGRESS NOTE  Steve [name] EGB:[id number] DOB: 1965-04-14 DOA: 01/05/2021 PCP: No primary care provider on file.  Status: Remains inpatient appropriate because:  Unsafe discharge Not medically stable secondary to ongoing physical therapy needs and severe cognitive impairment  Barriers to discharge: Social: Needs SNF but no funding in place-does not have a guardian but son willing to assist with management of patient regarding approval of procedures, signing appropriate paperwork, etc.  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 male with history of major depression, anxiety who was in a residential ETOH treatment center for last 5 months. He was brought to the ED on 12/20 from a group home for convalescence, combativeness and altered sensorium.  Per collateral history, patient was not acting typical for last few days. In the ED, patient was confused, combative Labs showed AKI with creatinine of 3.27, rhabdomyolysis, abnormal LFTs, elevated WBC count at 23.7 and lactic acid level at 7.6. 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  IV fluid resuscitation started Patient was admitted to ICU. 12/21, underwent LP which showed staph in CSF, felt to be contaminant.  EEG did not show any seizures 12/24, HD catheter was placed but patient did not require HD as creatinine started to improve 12/31, with clinical improvement, patient was transferred out of ICU to [facility]. 1/18 Cognitive  evaluations: 2 on the SLUMS, a 9 on the MMSE, and a 0 on medi-cog and short blessed test  Subjective: Patient awake sitting in bed completely nude.  When Posey enclosure unzipped patient attempted to get out of bed and explained to him to not do that and he complied quickly.  I explained to him why we needed to obtain lab work and get an EKG.  In the moment he seemed to understand.  Objective: Vitals:   02/16/21 2137 02/17/21 0557  BP: (!) 111/96 103/74  Pulse: 88 81  Resp: 18 18  Temp: 97.7 F (36.5 C) 98.2 F (36.8 C)  SpO2: 95% 100%    Intake/Output Summary (Last 24 hours) at 02/17/2021 0749 Last data filed at 02/16/2021 2137 Gross per 24 hour  Intake 237 ml  Output --  Net 237 ml   Filed Weights   02/02/21 0500 02/03/21 2357 02/04/21 0412  Weight: 60.5 kg 59.5 kg 59.5 kg    Exam:  Constitutional: NAD, restless but easily redirected Respiratory: Posterior lung sounds clear, Normal respiratory effort. RA Cardiovascular: Regular pulse, no peripheral edema.  Normotensive.  S1-S2 Abdomen: no tenderness, no masses palpated. Bowel sounds positive. LBM 1/31 Neurologic: CN 2-12 grossly intact. Sensation intact, Strength 3+-4/5 x all 4 extremities.  Psychiatric: Awake and mildly restless.  Remains confused.     Assessment/Plan: Acute problems: Significant cognitive impairment 2/2 alcoholic dementia/history of EtOH abuse x30+ years Cognitive screening revealed significant cognitive deficits Continue Prozac, BuSpar, trazodone-has known history of major depressive disorder-continue Zyprexa and Depakote for behavioral issues and cognitive impairment-2/1 will increase Zyprexa doses today Follow QTC, platelets and LFTs periodically while on Zyprexa and Depakote  Left lower extremity pain, weakness. Work-up including imaging unremarkable At this juncture appears  to be resolved   Witnessed seizure prior to admission No apparent prior history of seizure disorder noting patient was  not on AEDs prior to admission Unclear if withdrawal seizure or related to other substances EEG unremarkable.   Continue Keppra at recommendation of neurology   Acute renal failure secondary to rhabdomyolysis Acute metabolic acidosis Creatinine peaked at 7.48 on 12/26.  Nephrology consulted with initial plans to initiate dialysis including placement of [facility] but renal function gradually improved and dialysis not needed and TDC   Rhabdomyolysis Resolved   Ruled out upper extremity DVT Per critical care note, hemodialysis cath had a clot on it when it was removed.  UE duplex  12/31: no DVT.     Aspiration pneumonia Completed a course of Zosyn   Acute liver injury His AST and ALT were significantly elevated to over 700s on admission. Gradually improved to normal.   Evidence of right ventricular dysfunction and recent echocardiogram Echo 1/2 with EF 60 to 65%.  Cannot rule out a small PFO this finding determined to be clinically insignificant given the patient was asymptomatic     Scheduled Meds:  amLODipine  2.5 mg Oral Daily   aspirin  325 mg Oral Daily   Or   aspirin  300 mg Rectal Daily   busPIRone  10 mg Oral BID   docusate sodium  100 mg Oral BID   feeding supplement  237 mL Oral BID BM   FLUoxetine  40 mg Oral Daily   folic acid  1 mg Oral Daily   gabapentin  600 mg Oral TID   heparin injection (subcutaneous)  5,000 Units Subcutaneous Q8H   levETIRAcetam  500 mg Oral BID   lidocaine  1 patch Transdermal Q24H   mouth rinse  15 mL Mouth Rinse BID   nicotine  21 mg Transdermal Daily   OLANZapine  10 mg Oral QHS   OLANZapine  7.5 mg Oral Daily   polyethylene glycol  17 g Oral Daily   traZODone  100 mg Oral QHS   valproic acid  500 mg Oral BID   Continuous Infusions:  sodium chloride 10 mL/hr at 02/04/21 1457    Principal Problem:   Acute metabolic encephalopathy Active Problems:   Severe sepsis without septic shock ([name])   Fever   Pressure injury of skin    Alcoholic dementia ([name])   Wernicke's encephalopathy   Physical deconditioning   Right ventricular dysfunction   Consultants: Nephrology Psychiatry  Procedures: EEG Echocardiogram TDC  Antibiotics: Unasyn x1 dose, cefepime x1 dose, ceftriaxone x1 dose, vancomycin x2 doses   Time spent: 15 minutes    Junious Silk ANP  [facility] 7 am - 330 pm/M-F for direct patient care and secure chat Please refer to Amion for contact info 43  days

## 2021-02-18 LAB — CBC
HCT: 34.8 % — ABNORMAL LOW (ref 39.0–52.0)
Hemoglobin: 12.4 g/dL — ABNORMAL LOW (ref 13.0–17.0)
MCH: 33.2 pg (ref 26.0–34.0)
MCHC: 35.6 g/dL (ref 30.0–36.0)
MCV: 93 fL (ref 80.0–100.0)
Platelets: 265 K/uL (ref 150–400)
RBC: 3.74 MIL/uL — ABNORMAL LOW (ref 4.22–5.81)
RDW: 12.4 % (ref 11.5–15.5)
WBC: 6.3 K/uL (ref 4.0–10.5)
nRBC: 0 % (ref 0.0–0.2)

## 2021-02-18 LAB — HEPATIC FUNCTION PANEL
ALT: 18 U/L (ref 0–44)
AST: 21 U/L (ref 15–41)
Albumin: 3.9 g/dL (ref 3.5–5.0)
Alkaline Phosphatase: 43 U/L (ref 38–126)
Bilirubin, Direct: <0.1 mg/dL (ref 0.0–0.2)
Total Bilirubin: 0.4 mg/dL (ref 0.3–1.2)
Total Protein: 7.7 g/dL (ref 6.5–8.1)

## 2021-02-18 LAB — GLUCOSE, CAPILLARY: Glucose-Capillary: 110 mg/dL — ABNORMAL HIGH (ref 70–99)

## 2021-02-18 NOTE — Assessment & Plan Note (Addendum)
Resolved Creatinine peaked at 7.48 on 12/26. Nephrology consulted with initial plans to initiate dialysis including placement of [facility] but renal function gradually improved and dialysis not needed and TDC removed

## 2021-02-18 NOTE — Assessment & Plan Note (Signed)
Per critical care note, hemodialysis cath had a clot on it when it was removed. UE duplex  12/31: no DVT.

## 2021-02-18 NOTE — Hospital Course (Addendum)
Humberto Addo is a 56 y.o. male with history of major depression, anxiety who was in a residential ETOH treatment center for last 5 months. He was brought to the ED on 12/20 from a residential ETOH treatment center for convalescence, combativeness and altered sensorium.  Per collateral history, patient was not acting typical for last few days.  In the ED, patient was confused, combative. Labs showed AKI with creatinine of 3.27, rhabdomyolysis, abnormal LFTs, elevated WBC count at 23.7 and lactic acid level at 7.6. 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 Blood culture was sent, empiric antibiotic started.  IV fluid resuscitation started and patient was initially admitted to ICU. 12/21, underwent LP which showed staph in CSF, felt to be contaminant.  EEG did not show any seizures 12/24, HD catheter was placed but patient did not require HD as creatinine started to improve 12/31, with clinical improvement, patient was transferred out of ICU to [facility]. 1/18 Cognitive evaluations: 2 on the SLUMS, a 9 on the MMSE, and a 0 on medi-cog and short blessed test

## 2021-02-18 NOTE — Progress Notes (Addendum)
Progress Note   Patient: Steve [name] GXQ:[id number] DOB: April 09, 1965 DOA: 01/05/2021     44 DOS: the patient was seen and examined on 02/18/2021   Brief hospital course: 56 y.o. male with history of major depression, anxiety who was in a residential ETOH treatment center for last 5 months. He was brought to the ED on 12/20 from a group home for convalescence, combativeness and altered sensorium.  Per collateral history, patient was not acting typical for last few days. In the ED, patient was confused, combative Labs showed AKI with creatinine of 3.27, rhabdomyolysis, abnormal LFTs, elevated WBC count at 23.7 and lactic acid level at 7.6. 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  IV fluid resuscitation started Patient was admitted to ICU. 12/21, underwent LP which showed staph in CSF, felt to be contaminant.  EEG did not show any seizures 12/24, HD catheter was placed but patient did not require HD as creatinine started to improve 12/31, with clinical improvement, patient was transferred out of ICU to [name]. 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) Cognitive screening revealed significant cognitive deficits Continue Prozac, BuSpar, trazodone-has known history of major depressive disorder-continue Zyprexa and Depakote for behavioral issues and cognitive impairment-2/1 will increase Zyprexa doses today Follow QTC, platelets and LFTs periodically while on Zyprexa and Depakote 2/2 LFTs are normal, platelets are normal; QTC 469 ms on 2/1  Witnessed seizure ([name]) No apparent prior history of seizure disorder noting patient was not on AEDs prior to  admission Unclear if withdrawal seizure or related to other substances EEG unremarkable.   Continue Keppra at recommendation of neurology  Acute kidney injury with rhabdomyolysis Resolved Creatinine peaked at 7.48 on 12/26.  Nephrology consulted with initial plans to initiate dialysis including placement of [name] but renal function gradually improved and dialysis not needed and TDC  Possible DVT (deep venous thrombosis) ([name]) Per critical care note, hemodialysis cath had a clot on it when it was removed.  UE duplex  12/31: no DVT.    Aspiration pneumonia ([name]) Completed a course of Zosyn  Elevated transaminases at time of presentation Likely secondary to sepsis like physiology at presentation His AST and ALT were significantly elevated to over 700s on admission. Gradually improved to normal.   Right ventricular dysfunction- (present on admission) Echo 1/2 with EF 60 to 65%.  Cannot rule out a small PFO this finding determined to be clinically insignificant given the patient was asymptomatic  Physical deconditioning- (present on admission) Influenced by patient's poor cognition as well as prior heavy alcohol abuse and malnutrition before admission Therapy continues to recommend SNF placement due to level of assist required with ADLs and IADLs as well as ongoing poor cognition  Severe sepsis without septic shock ([name]) Ruled out Sepsis-like physiology secondary to presentation with profound dehydration, hypoperfusion from dehydration related hypotension seizure activity Chest work-up was negative        Subjective: Patient currently sleeping soundly and did not awaken  Physical Exam: Vitals:   02/17/21 1936 02/17/21 2314 02/18/21 0258 02/18/21 1122  BP: (!) 128/96 (!) 127/97 110/84 121/83  Pulse: 98 88 83 98  Resp: 16 16 16 18   Temp: 97.9 F (36.6 C)  97.9 F (36.6 C) 97.9 F (36.6 C) 98.3 F (36.8 C)  TempSrc: Oral Oral Oral   SpO2: 100% 100% 100% 100%  Weight:       Height:       Constitutional: NAD, restless but easily redirected Respiratory: Posterior lung sounds clear, Normal respiratory effort. RA Cardiovascular: Regular pulse, no peripheral edema.  Normotensive.  S1-S2 Abdomen: no tenderness, no masses palpated. Bowel sounds positive. LBM 1/31 Neurologic: CN 2-12 grossly intact. Sensation intact, Strength 3+-4/5 x all 4 extremities.  Psychiatric: Awake and mildly restless.  Remains confused.     Labs and other data: CBC and c-Met ordered  Disposition: Remains inpatient appropriate because:  Unsafe discharge Not medically stable secondary to ongoing physical therapy needs and severe cognitive impairment   Barriers to discharge: Social: Needs SNF but no funding in place-does not have a guardian but son willing to assist with management of patient regarding approval of procedures, signing appropriate paperwork, etc.   Clinical: Serial cognitive evaluations by SLP and OT demonstrate significant cognitive impairment in context of alcoholic dementia   Level of care:  Med-Surg   Planned Discharge Destination:  Skilled nursing facility  Code Status: Full Family Communication: Updated son 1/19 re dementia diagnosis and need for 24/7 supervision DVT prophylaxis: Subcutaneous heparin COVID vaccination status: Unknown      Consultants: Nephrology Psychiatry   Procedures: EEG Echocardiogram TDC   Antibiotics: Unasyn x1 dose, cefepime x1 dose, ceftriaxone x1 dose, vancomycin x2 doses    Time spent: 15 minutes  Author: Erin Hearing, NP 02/18/2021 12:00 PM  For on call review [email or web address].

## 2021-02-18 NOTE — Assessment & Plan Note (Signed)
Echo 1/2 with EF 60 to 65%. Cannot rule out a small PFO this finding determined to be clinically insignificant given the patient was asymptomatic

## 2021-02-18 NOTE — Assessment & Plan Note (Addendum)
Likely secondary to sepsis like physiology at presentation His AST and ALT were significantly elevated to over 700s on admission.  Re emergence mild elevation LFTs-likely due to Zyprexa and Depakote Due to oversedation Zyprexa dose decreased LFTs have normalized with decrease in Zyprexa dose. 2/16 valproic acid level 55

## 2021-02-18 NOTE — Assessment & Plan Note (Signed)
Completed a course of Zosyn

## 2021-02-18 NOTE — Assessment & Plan Note (Addendum)
No apparent prior history of seizure disorder noting patient was not on AEDs prior to admission Unclear if withdrawal seizure or related to other substances 3/10 given recent behavioral changes attending felt EEG needed to be completed to rule out atypical seizure activity causing behavioral manifestations.  EEG unremarkable other than for moderate diffuse encephalopathy.  No seizure activity. Continue Keppra at recommendation of neurology

## 2021-02-18 NOTE — Plan of Care (Signed)
Pt is alert oriented x 1, pt urinated in bed several times. It has been wiped down, including pt. Oral care completed.    Problem: Education: Goal: Knowledge of General Education information will improve Description: Including pain rating scale, medication(s)/side effects and non-pharmacologic comfort measures Outcome: Progressing   Problem: Health Behavior/Discharge Planning: Goal: Ability to manage health-related needs will improve Outcome: Progressing   Problem: Clinical Measurements: Goal: Ability to maintain clinical measurements within normal limits will improve Outcome: Progressing Goal: Will remain free from infection Outcome: Progressing Goal: Diagnostic test results will improve Outcome: Progressing Goal: Respiratory complications will improve Outcome: Progressing Goal: Cardiovascular complication will be avoided Outcome: Progressing   Problem: Activity: Goal: Risk for activity intolerance will decrease Outcome: Progressing   Problem: Nutrition: Goal: Adequate nutrition will be maintained Outcome: Progressing   Problem: Coping: Goal: Level of anxiety will decrease Outcome: Progressing   Problem: Elimination: Goal: Will not experience complications related to bowel motility Outcome: Progressing Goal: Will not experience complications related to urinary retention Outcome: Progressing   Problem: Pain Managment: Goal: General experience of comfort will improve Outcome: Progressing   Problem: Safety: Goal: Ability to remain free from injury will improve Outcome: Progressing   Problem: Skin Integrity: Goal: Risk for impaired skin integrity will decrease Outcome: Progressing   Problem: Education: Goal: Expressions of having a comfortable level of knowledge regarding the disease process will increase Outcome: Progressing   Problem: Health Behavior/Discharge Planning: Goal: Compliance with prescribed medication regimen will improve Outcome: Progressing    Problem: Medication: Goal: Risk for medication side effects will decrease Outcome: Progressing   Problem: Clinical Measurements: Goal: Complications related to the disease process, condition or treatment will be avoided or minimized Outcome: Progressing Goal: Diagnostic test results will improve Outcome: Progressing   Problem: Safety: Goal: Verbalization of understanding the information provided will improve Outcome: Progressing   Problem: Self-Concept: Goal: Level of anxiety will decrease Outcome: Progressing Goal: Ability to verbalize feelings about condition will improve Outcome: Progressing

## 2021-02-18 NOTE — Assessment & Plan Note (Addendum)
Initial MRI revealed tiny acute infarct on the right but not significant enough to explain persistent cognitive and behavioral abnormalities. 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 Depakote decreased to 500 mg BID, Zyprexa adjusted to 5 mg 2x daily during daytime hours, trazadone increased to 100 mg HS and HS dose of Klonopin dc'd. As of 3/10 psych felt patient was doing better with Risperdal as opposed to Zyprexa.  Updated on behavioral changes overnight that involved patient striking out at nurses with his arm.  Consideration being given to adjusting Risperdal and tapering and deseeding Zyprexa.  Of note patient has no recollection of last night's events or behavioral manifestations. Overnight into 3/8 patient's delusional thought processes worsened.  During the day he stated he was going back to work "next week".  Overnight he became extremely agitating stating he had to get to work and when staff attempted to keep him in the bed he began hitting at the staff.  He is now restrained at wrists in addition to Posey belt. Theyare considering going up on his Depakote and are also planning to titrate down on Zyprexa and go up on Risperdal while continuing to allow Zyprexa PRN for agitation.  LFTs back up; QTC stable at 464 ms (3/6); platelets are normal w/o thrombocytopneic changes; VPA is subtherapeutic at 37 with a normal albumin-3/8 we will repeat LFTs and EKG Continue thiamine-previously had been given high-dose IV thiamine earlier in the hospitalization with some improvement in cognition

## 2021-02-18 NOTE — Assessment & Plan Note (Addendum)
Influenced by patient's poor cognition as well as prior heavy alcohol abuse and malnutrition before admission Therapy continues to recommend SNF placement due to level of assist required with ADLs and IADLs as well as ongoing poor cognition Mobility continues to be influenced not only by cognition but by ambulatory dysfunction/gait and imbalance issues Current goal is to improve mobility to the point where patient can get up independently without being at risk for falls therefore Posey belt restraint can be discontinued  A physical therapy consult is indicated based on the patients mobility assessment.   Mobility Assessment (last 72 hours)    Mobility Assessment    Row Name 03/15/21 1700 03/15/21 1200 03/14/21 1200 03/14/21 0952 03/14/21 0830   Does patient have an order for bedrest or is patient medically unstable -- No - Continue assessment -- No - Continue assessment No - Continue assessment   What is the highest level of mobility based on the progressive mobility assessment? Level 4 (Walks with assist in room) - Balance while marching in place and cannot step forward and back - Complete Level 4 (Walks with assist in room) - Balance while marching in place and cannot step forward and back - Complete -- Level 4 (Walks with assist in room) - Balance while marching in place and cannot step forward and back - Complete Level 4 (Walks with assist in room) - Balance while marching in place and cannot step forward and back - Complete   Is the above level different from baseline mobility prior to current illness? -- Yes - Recommend PT order -- Yes - Recommend PT order Yes - Recommend PT order   Row Name 03/13/21 2034 03/13/21 0800         Does patient have an order for bedrest or is patient medically unstable No - Continue assessment No - Continue assessment      What is the highest level of mobility based on the progressive mobility assessment? Level 4 (Walks with assist in room) - Balance while  marching in place and cannot step forward and back - Complete Level 2 ([place]) - Balance while sitting on edge of bed and cannot stand      Is the above level different from baseline mobility prior to current illness? Yes - Recommend PT order Yes - Recommend PT order

## 2021-02-18 NOTE — Assessment & Plan Note (Signed)
Ruled out Sepsis-like physiology secondary to presentation with profound dehydration, hypoperfusion from dehydration related hypotension seizure activity Chest work-up was negative

## 2021-02-19 DIAGNOSIS — A419 Sepsis, unspecified organism: Secondary | ICD-10-CM | POA: Insufficient documentation

## 2021-02-19 DIAGNOSIS — R7401 Elevation of levels of liver transaminase levels: Secondary | ICD-10-CM

## 2021-02-19 LAB — GLUCOSE, CAPILLARY: Glucose-Capillary: 91 mg/dL (ref 70–99)

## 2021-02-19 NOTE — Progress Notes (Addendum)
Progress Note   Patient: Steve [name] DOB: [birth year] DOA: 01/05/2021     45 DOS: the patient was seen and examined on 02/19/2021   Brief hospital course: 56 y.o. male with history of major depression, anxiety who was in a residential ETOH treatment center for last 5 months. He was brought to the ED on 12/20 from a group home for convalescence, combativeness and altered sensorium.  Per collateral history, patient was not acting typical for last few days. In the ED, patient was confused, combative Labs showed AKI with creatinine of 3.27, rhabdomyolysis, abnormal LFTs, elevated WBC count at 23.7 and lactic acid level at 7.6. 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  IV fluid resuscitation started Patient was admitted to ICU. 12/21, underwent LP which showed staph in CSF, felt to be contaminant.  EEG did not show any seizures 12/24, HD catheter was placed but patient did not require HD as creatinine started to improve 12/31, with clinical improvement, patient was transferred out of ICU to [facility] 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) Cognitive screening revealed significant cognitive deficits Continue Prozac, BuSpar, trazodone-has known history of major depressive disorder-continue Zyprexa and Depakote for behavioral issues and cognitive impairment-2/1 will increase Zyprexa doses today Follow QTC, platelets and LFTs periodically while on Zyprexa and Depakote 2/2 LFTs are normal, platelets are normal; QTC 469 ms on 2/1 May be able to dc posey enclosure bed soon- d/w RN  Witnessed seizure ([name]) No apparent prior history of  seizure disorder noting patient was not on AEDs prior to admission Unclear if withdrawal seizure or related to other substances EEG unremarkable.   Continue Keppra at recommendation of neurology  Acute kidney injury with rhabdomyolysis Resolved Creatinine peaked at 7.48 on 12/26.  Nephrology consulted with initial plans to initiate dialysis including placement of [facility] but renal function gradually improved and dialysis not needed and TDC removed  Possible DVT (deep venous thrombosis) ([name]) Per critical care note, hemodialysis cath had a clot on it when it was removed.  UE duplex  12/31: no DVT.    Aspiration pneumonia ([name]) Completed a course of Zosyn  Elevated transaminases at time of presentation Likely secondary to sepsis like physiology at presentation His AST and ALT were significantly elevated to over 700s on admission. Gradually improved to normal.   Right ventricular dysfunction- (present on admission) Echo 1/2 with EF 60 to 65%.  Cannot rule out a small PFO this finding determined to be clinically insignificant given the patient was asymptomatic  Physical deconditioning- (present on admission) Influenced by patient's poor cognition as well as prior heavy alcohol abuse and malnutrition before admission Therapy continues to recommend SNF placement due to level of assist required with ADLs and IADLs as well as ongoing poor cognition  Severe sepsis without septic shock ([name]) Ruled out Sepsis-like physiology secondary to presentation with profound dehydration, hypoperfusion from dehydration related hypotension seizure activity Chest work-up was negative        Subjective: Sleeping soundly  Physical Exam: Vitals:   02/18/21 2006 02/19/21 0041 02/19/21 0406 02/19/21 0842  BP: 114/83 132/71 118/81 (!) 125/96  Pulse: 98 92 85 88  Resp: 17 17 17  16  Temp: 98.6 F (37 C) 98.3 F (36.8 C) 98 F (36.7 C) 98.2 F (36.8 C)  TempSrc: Oral Oral Oral Oral  SpO2: 100% 99% 98%  100%  Weight:      Height:       Constitutional: NAD, family today Respiratory: Posterior lung sounds clear, Normal respiratory effort. RA Cardiovascular: Regular pulse, no peripheral edema.  Normotensive.  S1-S2 Abdomen: no tenderness, no masses palpated. Bowel sounds positive. LBM 1/31 Neurologic: But at baseline CN 2-12 grossly intact. Sensation intact, Strength 3+-4/5 x all 4 extremities.  Psychiatric: Sleeping soundly  Data Reviewed:  There are no new results to review at this time.  Family Communication: Patient only  Disposition: Status is: Inpatient Remains inpatient appropriate because: Safe discharge plan secondary to severe cognitive impairment requiring SNF placement.  Currently lacks funding.    Planned Discharge Destination: Skilled nursing facility  COVID vaccination status: Unknown  Consultants: Nephrology Psychiatry    Procedures: EEG Echocardiogram TDC   Antibiotics: Unasyn x1 dose, cefepime x1 dose, ceftriaxone x1 dose, vancomycin x2 doses     Time spent: 15 minutes  Author: Junious Silk, NP 02/19/2021 12:13 PM  For on call review [email or web address].

## 2021-02-19 NOTE — Progress Notes (Signed)
Physical Therapy Treatment Patient Details Name: Steve [name] MRN: [id number] DOB: [birth year] Today's Date: 02/19/2021   History of Present Illness Pt is a 56 y.o. male admitted from group home for convalescence, combativeness and altered sensorium on 01/05/21 with witnessed seizure, AMS. 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 EEG unremarkable. LP on 12/21 showed staph in CSF, felt to be contaminant. Workup for AKI, rhabdomyolysis. Course complicated by c/o L foot pain; imaging negative for acute injury. Pt remains hospitalized due to difficult to place. Pt with psych consult who felt no need for inpt psych. No PMH in chart.    PT Comments    Pt making little progress with his impulsivities and safety awareness.  Mobility continues to improve, but L Foot limits stability and safety.  Emphasis on gait stability, negotiation of stairs and overall safety.    Recommendations for follow up therapy are one component of a multi-disciplinary discharge planning process, led by the attending physician.  Recommendations may be updated based on patient status, additional functional criteria and insurance authorization.  Follow Up Recommendations  Skilled nursing-short term rehab (<3 hours/day)     Assistance Recommended at Discharge Frequent or constant Supervision/Assistance  Patient can return home with the following A little help with walking and/or transfers;Assistance with cooking/housework;A little help with bathing/dressing/bathroom;Direct supervision/assist for financial management;Direct supervision/assist for medications management;Assist for transportation   Equipment Recommendations  Rolling walker (2 wheels)    Recommendations for Other Services       Precautions / Restrictions Precautions Precautions: Fall Precaution Comments: h/o L foot pain; in Posey  bed Restrictions LLE Weight Bearing: Weight bearing as tolerated Other Position/Activity Restrictions: CAM boot not needed per ortho note     Mobility  Bed Mobility Overal bed mobility: Needs Assistance       Supine to sit: Supervision Sit to supine: Supervision        Transfers Overall transfer level: Needs assistance   Transfers: Sit to/from Stand Sit to Stand: Min guard           General transfer comment: min guard for unsteadiness progressing to min assist with time up.    Ambulation/Gait Ambulation/Gait assistance: Min assist Gait Distance (Feet): 500 Feet Assistive device: 1 person hand held assist (rails) Gait Pattern/deviations: Step-through pattern, Decreased stance time - left Gait velocity: decreased Gait velocity interpretation: 1.31 - 2.62 ft/sec, indicative of limited community ambulator   General Gait Details: Antalgic on the left overall.  Stable most of the time with occasional stagger R LE  when I suspect Left foot w/bearing produces more discomfort.  Generally unsteady with pt losing focus frequently which exacerbates tenuous balance.   Stairs Stairs: Yes Stairs assistance: Mod assist, Max assist Stair Management: One rail Right, Step to pattern, Alternating pattern, Forwards Number of Stairs: 3 General stair comments: significant support for instability and incoordination with stepping up or down.   Wheelchair Mobility    Modified Rankin (Stroke Patients Only)       Balance Overall balance assessment: Needs assistance Sitting-balance support: Feet supported         Standing balance-Leahy Scale: Fair                              Cognition Arousal/Alertness: Awake/alert Behavior During Therapy: Flat affect Overall Cognitive Status: Impaired/Different from baseline  Orientation Level: Time, Situation Current Attention Level: Sustained Memory: Decreased short-term memory Following Commands:  Follows one step commands with increased time Safety/Judgement: Decreased awareness of safety, Decreased awareness of deficits Awareness: Intellectual Problem Solving: Requires verbal cues, Requires tactile cues, Slow processing          Exercises      General Comments        Pertinent Vitals/Pain Pain Assessment Pain Assessment: Faces Faces Pain Scale: Hurts little more Pain Location: L foot with ambulation, limping Pain Descriptors / Indicators: Guarding Pain Intervention(s): Monitored during session    Home Living Family/patient expects to be discharged to:: Skilled nursing facility Living Arrangements: Group Home Available Help at Discharge: Available 24 hours/day;Other (Comment) Type of Home: Group Home Home Access: Level entry       Home Layout: One level Home Equipment: None      Prior Function            PT Goals (current goals can now be found in the care plan section) Acute Rehab PT Goals PT Goal Formulation: With patient Time For Goal Achievement: 02/19/21 Potential to Achieve Goals: Fair Progress towards PT goals: Progressing toward goals    Frequency    Min 2X/week      PT Plan Current plan remains appropriate    Co-evaluation              AM-PAC PT "6 Clicks" Mobility   Outcome Measure  Help needed turning from your back to your side while in a flat bed without using bedrails?: None Help needed moving from lying on your back to sitting on the side of a flat bed without using bedrails?: A Little Help needed moving to and from a bed to a chair (including a wheelchair)?: A Lot Help needed standing up from a chair using your arms (e.g., wheelchair or bedside chair)?: A Lot Help needed to walk in hospital room?: A Lot Help needed climbing 3-5 steps with a railing? : A Lot 6 Click Score: 15    End of Session   Activity Tolerance: Patient tolerated treatment well Patient left: in bed;with call bell/phone within reach (in secured  vale bed) Nurse Communication: Mobility status PT Visit Diagnosis: Other abnormalities of gait and mobility (R26.89);Other symptoms and signs involving the nervous system (R29.898);Pain Pain - Right/Left: Left Pain - part of body: Ankle and joints of foot     Time: 8841-6606 PT Time Calculation (min) (ACUTE ONLY): 15 min  Charges:  $Gait Training: 8-22 mins                     02/19/2021  Jacinto Halim., PT Acute Rehabilitation Services [phone]  (pager) [phone]  (office)   Steve [name] Gwenith Tschida 02/19/2021, 3:18 PM

## 2021-02-19 NOTE — Progress Notes (Signed)
Occupational Therapy Treatment Patient Details Name: Steve [name] MRN: [id number] DOB: 11-24-1965 Today's Date: 02/19/2021   History of present illness Pt is a 56 y.o. male admitted from group home for convalescence, combativeness and altered sensorium on 01/05/21 with witnessed seizure, AMS. 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 EEG unremarkable. LP on 12/21 showed staph in CSF, felt to be contaminant. Workup for AKI, rhabdomyolysis. Course complicated by c/o L foot pain; imaging negative for acute injury. Pt remains hospitalized due to difficult to place. Pt with psych consult who felt no need for inpt psych. No PMH in chart.   OT comments  Pt continues to require min guard to min assist with mod-max cuing for safety and sequencing. Overall mobility is improving, however due to cognition, pt continues to require constant assist. OT will continue to follow acutely.   Recommendations for follow up therapy are one component of a multi-disciplinary discharge planning process, led by the attending physician.  Recommendations may be updated based on patient status, additional functional criteria and insurance authorization.    Follow Up Recommendations  Skilled nursing-short term rehab (<3 hours/day)    Assistance Recommended at Discharge Frequent or constant Supervision/Assistance  Patient can return home with the following  A little help with walking and/or transfers;A little help with bathing/dressing/bathroom;Assistance with cooking/housework;Assist for transportation;Help with stairs or ramp for entrance   Equipment Recommendations  None recommended by OT    Recommendations for Other Services      Precautions / Restrictions Precautions Precautions: Fall Precaution Comments: h/o L foot pain; in Posey bed Restrictions Weight Bearing Restrictions: No LLE Weight  Bearing: Weight bearing as tolerated Other Position/Activity Restrictions: CAM boot not needed per ortho note       Mobility Bed Mobility Overal bed mobility: Needs Assistance Bed Mobility: Supine to Sit, Sit to Supine     Supine to sit: Supervision Sit to supine: Supervision   General bed mobility comments: with cues for initiation    Transfers Overall transfer level: Needs assistance Equipment used: None Transfers: Sit to/from Stand Sit to Stand: Min guard           General transfer comment: min guard for unsteadiness progressing to min assist with time up.     Balance Overall balance assessment: Needs assistance Sitting-balance support: Feet supported Sitting balance-Leahy Scale: Good Sitting balance - Comments: Impulsively leaning and repositioning multiple times   Standing balance support: Single extremity supported, During functional activity Standing balance-Leahy Scale: Fair Standing balance comment: At times requiring assist due tobalance                           ADL either performed or assessed with clinical judgement   ADL Overall ADL's : Needs assistance/impaired                 Upper Body Dressing : Minimal assistance;Sitting   Lower Body Dressing: Moderate assistance;Sitting/lateral leans;Sit to/from stand   Toilet Transfer: Minimal assistance;Ambulation   Toileting- Clothing Manipulation and Hygiene: Moderate assistance;Sitting/lateral lean;Sit to/from stand       Functional mobility during ADLs: Minimal assistance General ADL Comments: Pt requiring mod to max verbal cuing for all ADL's this session, as well as assist due to poor balance/weakness.    Extremity/Trunk Assessment              Vision  Perception     Praxis      Cognition Arousal/Alertness: Awake/alert Behavior During Therapy: Flat affect Overall Cognitive Status: Impaired/Different from baseline Area of Impairment: Orientation, Attention,  Memory, Following commands, Safety/judgement, Awareness, Problem solving                 Orientation Level: Disoriented to, Place, Time, Situation Current Attention Level: Sustained Memory: Decreased short-term memory Following Commands: Follows one step commands with increased time Safety/Judgement: Decreased awareness of safety, Decreased awareness of deficits Awareness: Intellectual Problem Solving: Requires verbal cues, Requires tactile cues, Slow processing General Comments: Requires repetition, thinks that he is at work and is going to go to a cantina after work and have 3 shots of tequila to ease his foot pain.        Exercises      Shoulder Instructions       General Comments VSS on RA    Pertinent Vitals/ Pain       Pain Assessment Pain Assessment: Faces Faces Pain Scale: Hurts a little bit Pain Location: L foot with ambulation, limping Pain Descriptors / Indicators: Guarding Pain Intervention(s): Monitored during session, Limited activity within patient's tolerance  Home Living Family/patient expects to be discharged to:: Skilled nursing facility Living Arrangements: Group Home Available Help at Discharge: Available 24 hours/day;Other (Comment) Type of Home: Group Home Home Access: Level entry     Home Layout: One level     Bathroom Shower/Tub: Walk-in shower;Door   [employer] Toilet: Standard     Home Equipment: None          Prior Functioning/Environment              Frequency  Min 2X/week        Progress Toward Goals  OT Goals(current goals can now be found in the care plan section)  Progress towards OT goals: Progressing toward goals  Acute Rehab OT Goals Patient Stated Goal: 3 shots of tequila for pain OT Goal Formulation: With patient Time For Goal Achievement: 02/26/21 Potential to Achieve Goals: Fair ADL Goals Pt Will Perform Grooming: with modified independence;standing Pt Will Perform Lower Body Bathing: with  modified independence;sit to/from stand Pt Will Perform Lower Body Dressing: with modified independence;sit to/from stand Pt Will Transfer to Toilet: with modified independence;ambulating Pt Will Perform Toileting - Clothing Manipulation and hygiene: with modified independence;sit to/from stand Additional ADL Goal #1: Pt will demonstrate decreased pain to LEs by tolerating light touch as needed for LE ADLs in order to increase independence with and tolerance for self care.  Plan Discharge plan remains appropriate    Co-evaluation                 AM-PAC OT "6 Clicks" Daily Activity     Outcome Measure   Help from another person eating meals?: None Help from another person taking care of personal grooming?: A Little Help from another person toileting, which includes using toliet, bedpan, or urinal?: A Lot Help from another person bathing (including washing, rinsing, drying)?: A Lot Help from another person to put on and taking off regular upper body clothing?: A Little Help from another person to put on and taking off regular lower body clothing?: A Lot 6 Click Score: 16    End of Session Equipment Utilized During Treatment: Gait belt  OT Visit Diagnosis: Unsteadiness on feet (R26.81);Muscle weakness (generalized) (M62.81);Dizziness and giddiness (R42) Pain - Right/Left: Left Pain - part of body: Leg;Ankle and joints of foot   Activity Tolerance Patient  tolerated treatment well   Patient Left in bed;with call bell/phone within reach   Nurse Communication Mobility status        Time: [id number] OT Time Calculation (min): 18 min  Charges: OT General Charges $OT Visit: 1 Visit OT Treatments $Self Care/Home Management : 8-22 mins  Edwar Coe H., OTR/L Acute Rehabilitation  Rasheida Broden Elane Yolanda Bonine 02/19/2021, 5:25 PM

## 2021-02-19 NOTE — Progress Notes (Addendum)
Progress Note   Patient: Steve [name] [id number] DOB: Feb 12, 1965 DOA: 01/05/2021     45 DOS: the patient was seen and examined on 02/19/2021   Brief hospital course: 56 y.o. male with history of major depression, anxiety who was in a residential ETOH treatment center for last 5 months. He was brought to the ED on 12/20 from a group home for convalescence, combativeness and altered sensorium.  Per collateral history, patient was not acting typical for last few days. In the ED, patient was confused, combative Labs showed AKI with creatinine of 3.27, rhabdomyolysis, abnormal LFTs, elevated WBC count at 23.7 and lactic acid level at 7.6. 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  IV fluid resuscitation started Patient was admitted to ICU. 12/21, underwent LP which showed staph in CSF, felt to be contaminant.  EEG did not show any seizures 12/24, HD catheter was placed but patient did not require HD as creatinine started to improve 12/31, with clinical improvement, patient was transferred out of ICU to [facility]. 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) Cognitive screening revealed significant cognitive deficits Continue Prozac, BuSpar, trazodone-has known history of major depressive disorder-continue Zyprexa and Depakote for behavioral issues and cognitive impairment-2/1 will increase Zyprexa doses today Follow QTC, platelets and LFTs periodically while on Zyprexa and Depakote 2/2 LFTs are normal, platelets are normal; QTC 469 ms on 2/1 May be able to dc posey enclosure bed soon- d/w RN  Witnessed seizure ([name]) No apparent prior history of  seizure disorder noting patient was not on AEDs prior to admission Unclear if withdrawal seizure or related to other substances EEG unremarkable.   Continue Keppra at recommendation of neurology  Acute kidney injury with rhabdomyolysis Resolved Creatinine peaked at 7.48 on 12/26.  Nephrology consulted with initial plans to initiate dialysis including placement of [facility] but renal function gradually improved and dialysis not needed and TDC removed  Possible DVT (deep venous thrombosis) ([name]) Per critical care note, hemodialysis cath had a clot on it when it was removed.  UE duplex  12/31: no DVT.    Aspiration pneumonia ([name]) Completed a course of Zosyn  Elevated transaminases at time of presentation Likely secondary to sepsis like physiology at presentation His AST and ALT were significantly elevated to over 700s on admission. Gradually improved to normal.   Right ventricular dysfunction- (present on admission) Echo 1/2 with EF 60 to 65%.  Cannot rule out a small PFO this finding determined to be clinically insignificant given the patient was asymptomatic  Physical deconditioning- (present on admission) Influenced by patient's poor cognition as well as prior heavy alcohol abuse and malnutrition before admission Therapy continues to recommend SNF placement due to level of assist required with ADLs and IADLs as well as ongoing poor cognition  Severe sepsis without septic shock ([name]) Ruled out Sepsis-like physiology secondary to presentation with profound dehydration, hypoperfusion from dehydration related hypotension seizure activity Chest work-up was negative    Subjective:  Patient sleeping soundly  Physical Exam: Vitals:   02/18/21 2006 02/19/21 0041 02/19/21 0406 02/19/21 0842  BP: 114/83 132/71 118/81 (!) 125/96  Pulse: 98 92 85 88  Resp: 17 17 17 16   Temp: 98.6  F (37 C) 98.3 F (36.8 C) 98 F (36.7 C) 98.2 F (36.8 C)  TempSrc: Oral Oral Oral Oral  SpO2: 100% 99%  98% 100%  Weight:      Height:       Constitutional: NAD, family today Respiratory: Posterior lung sounds clear, Normal respiratory effort. RA Cardiovascular: Regular pulse, no peripheral edema.  Normotensive.  S1-S2 Abdomen: no tenderness, no masses palpated. Bowel sounds positive. LBM 1/31 Neurologic: But at baseline CN 2-12 grossly intact. Sensation intact, Strength 3+-4/5 x all 4 extremities.  Psychiatric: Sleeping soundly  Labs and other data: Labs from 2/2 reviewed  Disposition: Remains inpatient appropriate because:  Unsafe discharge Not medically stable secondary to ongoing physical therapy needs and severe cognitive impairment   Barriers to discharge: Social: Needs SNF but no funding in place-does not have a guardian but son willing to assist with management of patient regarding approval of procedures, signing appropriate paperwork, etc.   Clinical: Serial cognitive evaluations by SLP and OT demonstrate significant cognitive impairment in context of alcoholic dementia   Level of care:  Med-Surg   Planned Discharge Destination:  Skilled nursing facility  Code Status: Full Family Communication: Updated son 1/19 re dementia diagnosis and need for 24/7 supervision DVT prophylaxis: Subcutaneous heparin COVID vaccination status: Unknown      Consultants: Nephrology Psychiatry   Procedures: EEG Echocardiogram TDC   Antibiotics: Unasyn x1 dose, cefepime x1 dose, ceftriaxone x1 dose, vancomycin x2 doses    Time spent: 15 minutes  Author: Junious Silk, NP 02/19/2021 11:26 AM  For on call review [email or web address].

## 2021-02-20 DIAGNOSIS — I519 Heart disease, unspecified: Secondary | ICD-10-CM

## 2021-02-20 LAB — GLUCOSE, CAPILLARY: Glucose-Capillary: 95 mg/dL (ref 70–99)

## 2021-02-20 MED ORDER — LORAZEPAM 1 MG PO TABS
1.0000 mg | ORAL_TABLET | Freq: Four times a day (QID) | ORAL | Status: DC | PRN
  Administered 2021-02-20 – 2021-02-21 (×2): 1 mg via ORAL
  Filled 2021-02-20 (×2): qty 1

## 2021-02-20 NOTE — Progress Notes (Signed)
Progress Note   Patient: Steve [name] [id number]:[id number] DOB: 10/23/1965 DOA: 01/05/2021     46 DOS: the patient was seen and examined on 02/20/2021   Brief hospital course: Steve [name] is a 56 y.o. male with history of major depression, anxiety who was in a residential ETOH treatment center for last 5 months. He was brought to the ED on 12/20 from a group home for convalescence, combativeness and altered sensorium.  Per collateral history, patient was not acting typical for last few days.  In the ED, patient was confused, combative. Labs showed AKI with creatinine of 3.27, rhabdomyolysis, abnormal LFTs, elevated WBC count at 23.7 and lactic acid level at 7.6. 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 Blood culture was sent, empiric antibiotic started.  IV fluid resuscitation started and patient was initially admitted to ICU. 12/21, underwent LP which showed staph in CSF, felt to be contaminant.  EEG did not show any seizures 12/24, HD catheter was placed but patient did not require HD as creatinine started to improve 12/31, with clinical improvement, patient was transferred out of ICU to [facility]. 1/18 Cognitive evaluations: 2 on the SLUMS, a 9 on the MMSE, and a 0 on medi-cog and short blessed test  Assessment and Plan: Elevated transaminases at time of presentation Likely secondary to sepsis like physiology at presentation His AST and ALT were significantly elevated to over 700s on admission. Gradually improved to normal.   Aspiration pneumonia ([name]) Completed a course of Zosyn  Possible DVT (deep venous thrombosis) ([name]) Per critical care note, hemodialysis cath had a clot on it when it was removed.  UE duplex  12/31: no DVT.    Acute kidney injury with rhabdomyolysis Resolved Creatinine peaked at 7.48 on 12/26.  Nephrology consulted with initial plans to initiate  dialysis including placement of [facility] but renal function gradually improved and dialysis not needed and TDC removed  Witnessed seizure ([name]) No apparent prior history of seizure disorder noting patient was not on AEDs prior to admission Unclear if withdrawal seizure or related to other substances EEG unremarkable.   Continue Keppra at recommendation of neurology  Right ventricular dysfunction- (present on admission) Echo 1/2 with EF 60 to 65%.  Cannot rule out a small PFO this finding determined to be clinically insignificant given the patient was asymptomatic  Physical deconditioning- (present on admission) Influenced by patient's poor cognition as well as prior heavy alcohol abuse and malnutrition before admission Therapy continues to recommend SNF placement due to level of assist required with ADLs and IADLs as well as ongoing poor cognition  Cognitive impairment  2/2 Wernicke's encephalopathy and alcoholic dementia- (present on admission) Cognitive screening revealed significant cognitive deficits Continue Prozac, BuSpar, trazodone-has known history of major depressive disorder-continue Zyprexa and Depakote for behavioral issues and cognitive impairment-2/1 will increase Zyprexa doses today Follow QTC, platelets and LFTs periodically while on Zyprexa and Depakote 2/2 LFTs are normal, platelets are normal; QTC 469 ms on 2/1 May be able to dc posey enclosure bed soon- d/w RN but due to staffing unable to support bed sitter at this time and given his gait disturbance he is a high fall risk so we will continue Posey enclosure for now.  Severe sepsis without septic shock ([name]) Ruled out Sepsis-like physiology secondary to presentation with profound dehydration, hypoperfusion from dehydration related hypotension seizure activity Chest work-up was negative  Subjective:  Patient seen examined at bedside, resting comfortably; sleeping but easily arousable.  Remains in a Posey  enclosure due to fall risk.  Patient states has been eating and drinking well and no specific complaints this morning.  Nurse tech reports patient urinating and defecating in the bed, not using urinal or calling for assistance.  Discussed with primary RN, regarding discontinuation of Posey bed; patient is a high fall risk with gait disturbance and given no available bedside sitters; unsafe to remove at this time.  Patient denies headache, no chest pain, no shortness of breath, no abdominal pain.  No acute concerns overnight per nursing staff.  Physical Exam: Vitals:   02/19/21 1918 02/19/21 2325 02/20/21 0335 02/20/21 0752  BP: (!) 135/94 (!) 140/99 105/79 (!) 129/92  Pulse: 91 84 81 80  Resp: 18 16 18 18   Temp: 97.8 F (36.6 C) (!) 97.5 F (36.4 C) (!) 97.5 F (36.4 C) 97.8 F (36.6 C)  TempSrc: Oral Oral Axillary Oral  SpO2: 100% 100% 100% 100%  Weight:      Height:       Physical Exam GEN: 56 yo male in NAD, alert, oriented to place 53), but not time (2022), person (President: No answer), nor situation; appears older than stated age, chronically ill/cachectic in appearance HEENT: NCAT, PERRL, EOMI, sclera clear, MMM PULM: CTAB w/o wheezes/crackles, normal respiratory effort, on room air CV: RRR w/o M/G/R GI: abd soft, NTND, NABS, no R/G/M MSK: no peripheral edema, muscle strength globally intact 5/5 bilateral upper/lower extremities NEURO: CN II-XII intact, no focal deficits, sensation to light touch intact PSYCH: normal mood/affect Integumentary: dry/intact, no rashes or wounds   Data Reviewed:  There are no new results to review at this time.  Family Communication: No family present at bedside this morning  Disposition: Status is: Inpatient  Remains inpatient appropriate because: Unsafe discharge plans at this time due to his severe cognitive impairment requiring SNF placement, currently lacks funding.    Planned Discharge Destination: Barriers to discharge:  Cognitive impairment requiring SNF placement, lacks funding    heparin injection 5,000 Units Start: 01/16/21 1545    Time spent: 35 minutes spent on chart review, discussion with nursing staff, consultants, updating family and interview/physical exam; more than 50% of that time was spent in counseling and/or coordination of care.  Author: 01/18/21 Alvira Philips, DO 02/20/2021 10:17 AM  For on call review [email or web address].

## 2021-02-21 DIAGNOSIS — I824Y9 Acute embolism and thrombosis of unspecified deep veins of unspecified proximal lower extremity: Secondary | ICD-10-CM

## 2021-02-21 LAB — GLUCOSE, CAPILLARY: Glucose-Capillary: 88 mg/dL (ref 70–99)

## 2021-02-21 NOTE — Progress Notes (Signed)
Progress Note   Patient: Steve [name] [id number]:[id number] DOB: 08/17/1965 DOA: 01/05/2021     47 DOS: the patient was seen and examined on 02/21/2021   Brief hospital course: Steve [name] is a 56 y.o. male with history of major depression, anxiety who was in a residential ETOH treatment center for last 5 months. He was brought to the ED on 12/20 from a group home for convalescence, combativeness and altered sensorium.  Per collateral history, patient was not acting typical for last few days.  In the ED, patient was confused, combative. Labs showed AKI with creatinine of 3.27, rhabdomyolysis, abnormal LFTs, elevated WBC count at 23.7 and lactic acid level at 7.6. 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 Blood culture was sent, empiric antibiotic started.  IV fluid resuscitation started and patient was initially admitted to ICU. 12/21, underwent LP which showed staph in CSF, felt to be contaminant.  EEG did not show any seizures 12/24, HD catheter was placed but patient did not require HD as creatinine started to improve 12/31, with clinical improvement, patient was transferred out of ICU to [facility]. 1/18 Cognitive evaluations: 2 on the SLUMS, a 9 on the MMSE, and a 0 on medi-cog and short blessed test  Assessment and Plan: Elevated transaminases at time of presentation Likely secondary to sepsis like physiology at presentation His AST and ALT were significantly elevated to over 700s on admission. Gradually improved to normal.   Aspiration pneumonia ([name]) Completed a course of Zosyn  Possible DVT (deep venous thrombosis) ([name]) Per critical care note, hemodialysis cath had a clot on it when it was removed.  UE duplex  12/31: no DVT.    Acute kidney injury with rhabdomyolysis Resolved Creatinine peaked at 7.48 on 12/26.  Nephrology consulted with initial plans to initiate  dialysis including placement of [facility] but renal function gradually improved and dialysis not needed and TDC removed  Witnessed seizure ([name]) No apparent prior history of seizure disorder noting patient was not on AEDs prior to admission Unclear if withdrawal seizure or related to other substances EEG unremarkable.   Continue Keppra at recommendation of neurology  Right ventricular dysfunction- (present on admission) Echo 1/2 with EF 60 to 65%.  Cannot rule out a small PFO this finding determined to be clinically insignificant given the patient was asymptomatic  Physical deconditioning- (present on admission) Influenced by patient's poor cognition as well as prior heavy alcohol abuse and malnutrition before admission Therapy continues to recommend SNF placement due to level of assist required with ADLs and IADLs as well as ongoing poor cognition  Cognitive impairment  2/2 Wernicke's encephalopathy and alcoholic dementia- (present on admission) RoughCognitive screening revealed significant cognitive deficits Continue Prozac, BuSpar, trazodone-has known history of major depressive disorder-continue Zyprexa and Depakote for behavioral issues and cognitive impairment-2/1 will increase Zyprexa doses today Follow QTC, platelets and LFTs periodically while on Zyprexa and Depakote 2/2 LFTs are normal, platelets are normal; QTC 469 ms on 2/1 --Attempted discontinuation of Posey enclosure bed on 02/21/2021, tolerated 4 hours before increased agitation and patient returned to enclosure bed.    Severe sepsis without septic shock ([name]) Ruled out Sepsis-like physiology secondary to presentation with profound dehydration, hypoperfusion from dehydration related hypotension seizure activity Chest work-up was negative        Subjective:  Patient seen examined at bedside, remains in [place] enclosure bed.  Kneeling on bed.  Pleasantly confused.  No specific complaints at this time.  Attempted to discontinue  Posey enclosure bed yesterday, unsuccessful and only lasting for hours before increased agitation.  Discussed with primary RN this morning.  Remains high fall risk with gait disturbance.  Denies chest pain, no shortness of breath, no abdominal pain.  No acute events overnight per nursing staff.  Physical Exam: Vitals:   02/20/21 2357 02/21/21 0429 02/21/21 0500 02/21/21 0700  BP: (!) 117/103 112/86  111/81  Pulse: 93 89  95  Resp: 15 18  18   Temp: 98.1 F (36.7 C) 97.7 F (36.5 C)  98 F (36.7 C)  TempSrc: Oral Oral  Oral  SpO2: 100% 100%  100%  Weight:   59.5 kg   Height:       Physical Exam GEN: 56 yo male in NAD, alert, pleasantly confused, not oriented to person 53: No answer), time (1993), place Research scientist (physical sciences) health); or situation; chronically ill in appearance, appears older than stated age HEENT: NCAT, PERRL, EOMI, sclera clear, MMM PULM: CTAB w/o wheezes/crackles, normal respiratory effort, on room air CV: RRR w/o M/G/R GI: abd soft, NTND, NABS, no R/G/M MSK: no peripheral edema, muscle strength globally intact 5/5 bilateral upper/lower extremities NEURO: CN II-XII intact, no focal deficits, sensation to light touch intact PSYCH: normal mood/affect Integumentary: dry/intact, no rashes or wounds   Data Reviewed:  There are no new results to review at this time.  Family Communication: No family present at bedside this morning  Disposition: Status is: Inpatient  Remains inpatient appropriate because: Unsafe discharge at this time due to his severe cognitive impairment requiring SNF placement, currently lacks funding.    Planned Discharge Destination: Barriers to discharge: Needs SNF placement cognitive impairment, lacks funding    heparin injection 5,000 Units Start: 01/16/21 1545    Time spent: 36 minutes spent on chart review, discussion with nursing staff, consultants, updating family and interview/physical exam; more than 50% of that time was spent in  counseling and/or coordination of care.  Author: 01/18/21 Alvira Philips, DO 02/21/2021 9:48 AM  For on call review [email or web address].

## 2021-02-22 MED ORDER — ENOXAPARIN SODIUM 40 MG/0.4ML IJ SOSY
40.0000 mg | PREFILLED_SYRINGE | INTRAMUSCULAR | Status: DC
  Administered 2021-02-23 – 2021-02-26 (×4): 40 mg via SUBCUTANEOUS
  Filled 2021-02-22 (×4): qty 0.4

## 2021-02-22 NOTE — Progress Notes (Signed)
Physical Therapy Treatment Patient Details Name: Steve [name] MRN: [id number] DOB: 07-15-1965 Today's Date: 02/22/2021   History of Present Illness Pt is a 56 y.o. male admitted from group home for convalescence, combativeness and altered sensorium on 01/05/21 with witnessed seizure, AMS. 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 EEG unremarkable. LP on 12/21 showed staph in CSF, felt to be contaminant. Workup for AKI, rhabdomyolysis. Course complicated by c/o L foot pain; imaging negative for acute injury. Pt remains hospitalized due to difficult to place. Pt with psych consult who felt no need for inpt psych. No PMH in chart.    PT Comments    Pt up on his knees in posey bed without clothes on.  No making sense about what he was doing at the moment.  Emphasis on safety overall in transfers and gait with work on stability/stamina and overall quality with the RW and without any AD.    Recommendations for follow up therapy are one component of a multi-disciplinary discharge planning process, led by the attending physician.  Recommendations may be updated based on patient status, additional functional criteria and insurance authorization.  Follow Up Recommendations  Skilled nursing-short term rehab (<3 hours/day)     Assistance Recommended at Discharge Frequent or constant Supervision/Assistance  Patient can return home with the following A little help with walking and/or transfers;Assistance with cooking/housework;A little help with bathing/dressing/bathroom;Direct supervision/assist for financial management;Direct supervision/assist for medications management;Assist for transportation   Equipment Recommendations  Rolling walker (2 wheels)    Recommendations for Other Services       Precautions / Restrictions Precautions Precautions: Fall Precaution Comments: h/o L  foot pain; in Posey bed Restrictions Other Position/Activity Restrictions: CAM boot not needed per ortho note     Mobility  Bed Mobility Overal bed mobility: Needs Assistance       Supine to sit: Supervision Sit to supine: Supervision        Transfers Overall transfer level: Needs assistance   Transfers: Sit to/from Stand Sit to Stand: Min guard           General transfer comment: min guard for unsteadiness progressing to min assist with time up.    Ambulation/Gait Ambulation/Gait assistance: Min assist Gait Distance (Feet): 380 Feet Assistive device: 1 person hand held assist, Rolling walker (2 wheels) (rails) Gait Pattern/deviations: Step-through pattern, Decreased stance time - left Gait velocity: decreased Gait velocity interpretation: <1.8 ft/sec, indicate of risk for recurrent falls   General Gait Details: As during the previous session, antalgic on the left overall.  Stable most of the time with occasional stagger R LE  when I suspect Left foot w/bearing produces more discomfort.  Generally unsteady with pt losing focus frequently which exacerbates tenuous balance.   Stairs             Wheelchair Mobility    Modified Rankin (Stroke Patients Only)       Balance Overall balance assessment: Needs assistance Sitting-balance support: Feet supported Sitting balance-Leahy Scale: Good     Standing balance support: Single extremity supported, No upper extremity supported, During functional activity                                Cognition Arousal/Alertness: Awake/alert Behavior During Therapy: Flat affect Overall Cognitive Status: Impaired/Different from baseline  Orientation Level: Time, Situation Current Attention Level: Sustained Memory: Decreased short-term memory Following Commands: Follows one step commands with increased time Safety/Judgement: Decreased awareness of safety, Decreased awareness of  deficits Awareness: Intellectual Problem Solving: Requires verbal cues, Requires tactile cues, Slow processing          Exercises      General Comments        Pertinent Vitals/Pain Pain Assessment Pain Assessment: Faces Faces Pain Scale: Hurts little more Pain Location: L foot with ambulation, limping Pain Descriptors / Indicators: Guarding    Home Living                          Prior Function            PT Goals (current goals can now be found in the care plan section) Acute Rehab PT Goals PT Goal Formulation: With patient Time For Goal Achievement: 02/19/21 Potential to Achieve Goals: Fair Progress towards PT goals: Progressing toward goals    Frequency    Min 2X/week      PT Plan Current plan remains appropriate    Co-evaluation              AM-PAC PT "6 Clicks" Mobility   Outcome Measure  Help needed turning from your back to your side while in a flat bed without using bedrails?: None Help needed moving from lying on your back to sitting on the side of a flat bed without using bedrails?: A Little Help needed moving to and from a bed to a chair (including a wheelchair)?: A Lot Help needed standing up from a chair using your arms (e.g., wheelchair or bedside chair)?: A Lot Help needed to walk in hospital room?: A Lot Help needed climbing 3-5 steps with a railing? : A Lot 6 Click Score: 15    End of Session   Activity Tolerance: Patient tolerated treatment well;Patient limited by fatigue Patient left: in bed;with call bell/phone within reach (in secured vale bed) Nurse Communication: Mobility status PT Visit Diagnosis: Other abnormalities of gait and mobility (R26.89);Other symptoms and signs involving the nervous system (R29.898);Pain Pain - Right/Left: Left Pain - part of body: Ankle and joints of foot     Time: 1550-1605 PT Time Calculation (min) (ACUTE ONLY): 15 min  Charges:  $Gait Training: 8-22 mins                      02/22/2021  Jacinto Halim., PT Acute Rehabilitation Services [phone]  (pager) [phone]  (office)   Eliseo Gum Ahri Olson 02/22/2021, 4:09 PM

## 2021-02-22 NOTE — Plan of Care (Signed)
°  Problem: Education: Goal: Knowledge of General Education information will improve Description: Including pain rating scale, medication(s)/side effects and non-pharmacologic comfort measures Outcome: Not Progressing   Problem: Health Behavior/Discharge Planning: Goal: Ability to manage health-related needs will improve Outcome: Not Progressing   Problem: Clinical Measurements: Goal: Ability to maintain clinical measurements within normal limits will improve Outcome: Not Progressing Goal: Will remain free from infection Outcome: Not Progressing Goal: Diagnostic test results will improve Outcome: Not Progressing Goal: Respiratory complications will improve Outcome: Not Progressing Goal: Cardiovascular complication will be avoided Outcome: Not Progressing   Problem: Activity: Goal: Risk for activity intolerance will decrease Outcome: Not Progressing   Problem: Nutrition: Goal: Adequate nutrition will be maintained Outcome: Not Progressing   Problem: Coping: Goal: Level of anxiety will decrease Outcome: Not Progressing   Problem: Elimination: Goal: Will not experience complications related to bowel motility Outcome: Not Progressing Goal: Will not experience complications related to urinary retention Outcome: Not Progressing   Problem: Pain Managment: Goal: General experience of comfort will improve Outcome: Not Progressing   Problem: Safety: Goal: Ability to remain free from injury will improve Outcome: Not Progressing   Problem: Skin Integrity: Goal: Risk for impaired skin integrity will decrease Outcome: Not Progressing   Problem: Education: Goal: Expressions of having a comfortable level of knowledge regarding the disease process will increase Outcome: Not Progressing   Problem: Coping: Goal: Ability to adjust to condition or change in health will improve Outcome: Not Progressing Goal: Ability to identify appropriate support needs will improve Outcome: Not  Progressing   Problem: Health Behavior/Discharge Planning: Goal: Compliance with prescribed medication regimen will improve Outcome: Not Progressing   Problem: Medication: Goal: Risk for medication side effects will decrease Outcome: Not Progressing   Problem: Clinical Measurements: Goal: Complications related to the disease process, condition or treatment will be avoided or minimized Outcome: Not Progressing Goal: Diagnostic test results will improve Outcome: Not Progressing   Problem: Self-Concept: Goal: Level of anxiety will decrease Outcome: Not Progressing Goal: Ability to verbalize feelings about condition will improve Outcome: Not Progressing   Problem: Safety: Goal: Verbalization of understanding the information provided will improve Outcome: Not Progressing

## 2021-02-22 NOTE — Progress Notes (Signed)
Progress Note   Patient: Steve [name] STM:[id number] DOB: 1965-06-24 DOA: 01/05/2021     48 DOS: the patient was seen and examined on 02/22/2021   Brief hospital course: Steve [name] is a 56 y.o. male with history of major depression, anxiety who was in a residential ETOH treatment center for last 5 months. He was brought to the ED on 12/20 from a group home for convalescence, combativeness and altered sensorium.  Per collateral history, patient was not acting typical for last few days.  In the ED, patient was confused, combative. Labs showed AKI with creatinine of 3.27, rhabdomyolysis, abnormal LFTs, elevated WBC count at 23.7 and lactic acid level at 7.6. 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 Blood culture was sent, empiric antibiotic started.  IV fluid resuscitation started and patient was initially admitted to ICU. 12/21, underwent LP which showed staph in CSF, felt to be contaminant.  EEG did not show any seizures 12/24, HD catheter was placed but patient did not require HD as creatinine started to improve 12/31, with clinical improvement, patient was transferred out of ICU to [facility]. 1/18 Cognitive evaluations: 2 on the SLUMS, a 9 on the MMSE, and a 0 on medi-cog and short blessed test  Assessment and Plan: Elevated transaminases at time of presentation Likely secondary to sepsis like physiology at presentation His AST and ALT were significantly elevated to over 700s on admission. Gradually improved to normal.   Aspiration pneumonia ([name]) Completed a course of Zosyn  Possible DVT (deep venous thrombosis) ([name]) Per critical care note, hemodialysis cath had a clot on it when it was removed.  UE duplex  12/31: no DVT.    Acute kidney injury with rhabdomyolysis Resolved Creatinine peaked at 7.48 on 12/26.  Nephrology consulted with initial plans to initiate  dialysis including placement of [facility] but renal function gradually improved and dialysis not needed and TDC removed  Witnessed seizure ([name]) No apparent prior history of seizure disorder noting patient was not on AEDs prior to admission Unclear if withdrawal seizure or related to other substances EEG unremarkable.   Continue Keppra at recommendation of neurology  Right ventricular dysfunction- (present on admission) Echo 1/2 with EF 60 to 65%.  Cannot rule out a small PFO this finding determined to be clinically insignificant given the patient was asymptomatic  Physical deconditioning- (present on admission) Influenced by patient's poor cognition as well as prior heavy alcohol abuse and malnutrition before admission Therapy continues to recommend SNF placement due to level of assist required with ADLs and IADLs as well as ongoing poor cognition  Cognitive impairment  2/2 Wernicke's encephalopathy and alcoholic dementia- (present on admission) RoughCognitive screening revealed significant cognitive deficits Continue Prozac, BuSpar, trazodone-has known history of major depressive disorder-continue Zyprexa and Depakote for behavioral issues and cognitive impairment-2/1 will increase Zyprexa doses today Follow QTC, platelets and LFTs periodically while on Zyprexa and Depakote 2/2 LFTs are normal, platelets are normal; QTC 469 ms on 2/1 --Attempted discontinuation of Posey enclosure bed on 02/21/2021, tolerated 4 hours before increased agitation and patient returned to enclosure bed.    Severe sepsis without septic shock ([name]) Ruled out Sepsis-like physiology secondary to presentation with profound dehydration, hypoperfusion from dehydration related hypotension seizure activity Chest work-up was negative        Subjective:  Patient seen and examined at bedside, resting comfortably.  Sleeping but easily arousable.  No specific complaints  this morning.  Mental status continues to wax and wane,  he is actually alert and oriented x3 this morning.  Remains in a Posey enclosure bed due to his intermittent agitation, gait disturbance and high risk for falls.  Attempted transition out of Posey bed 2 days ago and only tolerated 4 hours prior to worsening agitation and was replaced.  No other questions or concerns at this time.  No family present.  Patient denies headache, no chest pain, no shortness of breath, no abdominal pain.  Difficult disposition given lack of funding.  No acute concerns overnight per nursing staff.  Physical Exam: Vitals:   02/21/21 2027 02/22/21 0057 02/22/21 0402 02/22/21 0812  BP: 121/86 104/74 (!) 113/97 (!) 123/94  Pulse: 87 81 86 82  Resp: 19 19 16 18   Temp: 98.4 F (36.9 C) (!) 97.5 F (36.4 C) 97.9 F (36.6 C) 98 F (36.7 C)  TempSrc: Oral Oral Oral Oral  SpO2: 100% 97% 100% 100%  Weight:      Height:       Physical Exam GEN: 56 yo male in NAD, alert and oriented x 3 place: ([place]), time (2023), Person 02-16-1980), thin in appearance, appears older than stated age HEENT: NCAT, PERRL, EOMI, sclera clear, MMM PULM: CTAB w/o wheezes/crackles, normal respiratory effort, on room air CV: RRR w/o M/G/R GI: abd soft, NTND, NABS, no R/G/M MSK: no peripheral edema, muscle strength globally intact 5/5 bilateral upper/lower extremities NEURO: CN II-XII intact, no focal deficits, sensation to light touch intact PSYCH: normal mood/affect Integumentary: dry/intact, no rashes or wounds   Data Reviewed:  There are no new results to review at this time.  Family Communication: No family present at bedside this morning  Disposition: Status is: Inpatient  Remains inpatient appropriate because: Unsafe discharge at this time due to his severe cognitive impairment requiring SNF placement, currently lacks funding    Planned Discharge Destination: Skilled nursing facility     heparin injection 5,000 Units Start: 01/16/21 1545    Time spent: 39  minutes spent on chart review, discussion with nursing staff, consultants, updating family and interview/physical exam; more than 50% of that time was spent in counseling and/or coordination of care.  Author: 01/18/21 Alvira Philips, DO 02/22/2021 9:51 AM  For on call review [email or web address].

## 2021-02-23 LAB — COMPREHENSIVE METABOLIC PANEL
ALT: 39 U/L (ref 0–44)
AST: 37 U/L (ref 15–41)
Albumin: 4.2 g/dL (ref 3.5–5.0)
Alkaline Phosphatase: 44 U/L (ref 38–126)
Anion gap: 8 (ref 5–15)
BUN: 23 mg/dL — ABNORMAL HIGH (ref 6–20)
CO2: 27 mmol/L (ref 22–32)
Calcium: 9.5 mg/dL (ref 8.9–10.3)
Chloride: 101 mmol/L (ref 98–111)
Creatinine, Ser: 0.64 mg/dL (ref 0.61–1.24)
GFR, Estimated: >60 mL/min (ref >60)
Glucose, Bld: 102 mg/dL — ABNORMAL HIGH (ref 70–99)
Potassium: 3.4 mmol/L — ABNORMAL LOW (ref 3.5–5.1)
Sodium: 136 mmol/L (ref 135–145)
Total Bilirubin: 0.2 mg/dL — ABNORMAL LOW (ref 0.3–1.2)
Total Protein: 7.4 g/dL (ref 6.5–8.1)

## 2021-02-23 MED ORDER — CLONAZEPAM 0.5 MG PO TABS
0.5000 mg | ORAL_TABLET | Freq: Two times a day (BID) | ORAL | Status: DC
  Administered 2021-02-23 – 2021-02-24 (×3): 0.5 mg via ORAL
  Filled 2021-02-23 (×3): qty 1

## 2021-02-23 NOTE — Progress Notes (Addendum)
Progress Note   Patient: Steve [name] WVP:[id number] DOB: 1965-11-24 DOA: 01/05/2021     49 DOS: the patient was seen and examined on 02/23/2021   Brief hospital course: Steve [name] is a 56 y.o. male with history of major depression, anxiety who was in a residential ETOH treatment center for last 5 months. He was brought to the ED on 12/20 from a group home for convalescence, combativeness and altered sensorium.  Per collateral history, patient was not acting typical for last few days.  In the ED, patient was confused, combative. Labs showed AKI with creatinine of 3.27, rhabdomyolysis, abnormal LFTs, elevated WBC count at 23.7 and lactic acid level at 7.6. 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 Blood culture was sent, empiric antibiotic started.  IV fluid resuscitation started and patient was initially admitted to ICU. 12/21, underwent LP which showed staph in CSF, felt to be contaminant.  EEG did not show any seizures 12/24, HD catheter was placed but patient did not require HD as creatinine started to improve 12/31, with clinical improvement, patient was transferred out of ICU to [facility]. 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) RoughCognitive screening revealed significant cognitive deficits Continue Prozac, BuSpar, trazodone-has known history of major depressive disorder-continue Zyprexa and Depakote for behavioral issues and cognitive impairment-2/1 will increase Zyprexa doses today Follow QTC, platelets and LFTs periodically while on Zyprexa and Depakote 2/2 LFTs are normal, platelets are normal; QTC 469 ms on 2/1 --Attempted discontinuation of Posey enclosure bed on 02/21/2021, tolerated 4  hours before increased agitation and patient returned to enclosure bed.   -2/7: Add Klonopin 0.5 mg BID for ongoing agitation preventing dc of posey bed    Witnessed seizure ([name]) No apparent prior history of seizure disorder noting patient was not on AEDs prior to admission Unclear if withdrawal seizure or related to other substances EEG unremarkable.   Continue Keppra at recommendation of neurology  Acute kidney injury with rhabdomyolysis Resolved Creatinine peaked at 7.48 on 12/26.  Nephrology consulted with initial plans to initiate dialysis including placement of [facility] but renal function gradually improved and dialysis not needed and TDC removed  Possible DVT (deep venous thrombosis) ([name]) Per critical care note, hemodialysis cath had a clot on it when it was removed.  UE duplex  12/31: no DVT.    Aspiration pneumonia ([name]) Completed a course of Zosyn  Elevated transaminases at time of presentation Likely secondary to sepsis like physiology at presentation His AST and ALT were significantly elevated to over 700s on admission. Gradually improved to normal.   Right ventricular dysfunction- (present on admission) Echo 1/2 with EF 60 to 65%.  Cannot rule out a small PFO this finding determined to be clinically insignificant given the patient was asymptomatic  Physical deconditioning- (present on admission) Influenced by patient's poor cognition as well as prior heavy alcohol abuse and malnutrition before admission Therapy continues to recommend SNF placement due to level of assist required with ADLs and IADLs as well as ongoing poor cognition  Severe sepsis without septic shock ([name]) Ruled out Sepsis-like physiology secondary to presentation with profound dehydration, hypoperfusion from dehydration related hypotension seizure activity Chest work-up was negative        Subjective: Sleeping soundly  and did not awaken upon entry into the room  Physical Exam: Vitals:    02/22/21 2127 02/22/21 2330 02/23/21 0753 02/23/21 1149  BP: 119/89 120/90 118/89 128/88  Pulse: 92 90 90 92  Resp: 19 17 18 18   Temp: 98.4 F (36.9 C) 98.3 F (36.8 C) 98 F (36.7 C) 97.6 F (36.4 C)  TempSrc: Oral Oral Oral Oral  SpO2: 94% 100% 100% 100%  Weight:      Height:       Constitutional: NAD, family today Respiratory: Posterior lung sounds clear, Normal respiratory effort. RA Cardiovascular: Regular pulse, no peripheral edema.  Normotensive.  S1-S2 Abdomen: no tenderness, no masses palpated. Bowel sounds positive. LBM 2/6 Neurologic: But at baseline CN 2-12 grossly intact. Sensation intact, Strength 3+-4/5 x all 4 extremities.  Psychiatric: Sleeping soundly  Data Reviewed:  There are no new results to review at this time.  Family Communication: Patient only  Disposition: Status is: Inpatient Remains inpatient appropriate because: Safe discharge plan secondary to severe cognitive impairment requiring SNF placement.  Currently lacks funding.   DVT Prophylaxis  ., Enoxaparin (lovenox) injection 40 mg     Planned Discharge Destination:  Skilled nursing facility  COVID vaccination status: Unknown  Consultants: Nephrology Psychiatry    Procedures: EEG Echocardiogram TDC   Antibiotics: Unasyn x1 dose, cefepime x1 dose, ceftriaxone x1 dose, vancomycin x2 doses     Time spent: 15 minutes  Author: , NP 02/23/2021 12:53 PM  For on call review [email or web address].

## 2021-02-23 NOTE — Progress Notes (Signed)
Occupational Therapy Treatment Patient Details Name: Steve [name] MRN: [id number] DOB: August 01, 1965 Today's Date: 02/23/2021   History of present illness Pt is a 56 y.o. male admitted from group home for convalescence, combativeness and altered sensorium on 01/05/21 with witnessed seizure, AMS. 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 EEG unremarkable. LP on 12/21 showed staph in CSF, felt to be contaminant. Workup for AKI, rhabdomyolysis. Course complicated by c/o L foot pain; imaging negative for acute injury. Pt remains hospitalized due to difficult to place. Pt with psych consult who felt no need for inpt psych. No PMH in chart.   OT comments  Steve [name] continues to make incremental progress towards his acute OT goals. Overall he requires supervision - min guard A for physical assist for unsteady balance and RW management. However, he continues to require mod-max multimodal cues for cognitive assist. Pt presents with heavy R drift during hallway ambulation and no carry over of education or problem solving. Pt continues to benefit, d/c remains appropriate.    Recommendations for follow up therapy are one component of a multi-disciplinary discharge planning process, led by the attending physician.  Recommendations may be updated based on patient status, additional functional criteria and insurance authorization.    Follow Up Recommendations  Skilled nursing-short term rehab (<3 hours/day)    Assistance Recommended at Discharge Frequent or constant Supervision/Assistance  Patient can return home with the following  A little help with walking and/or transfers;A little help with bathing/dressing/bathroom;Assistance with cooking/housework;Assist for transportation;Help with stairs or ramp for entrance   Equipment Recommendations  None recommended by OT       Precautions /  Restrictions Precautions Precautions: Fall Precaution Comments: h/o L foot pain; in Posey bed Restrictions Weight Bearing Restrictions: No LLE Weight Bearing: Weight bearing as tolerated Other Position/Activity Restrictions: CAM boot not needed per ortho note       Mobility Bed Mobility Overal bed mobility: Needs Assistance Bed Mobility: Supine to Sit, Sit to Supine     Supine to sit: Supervision Sit to supine: Supervision   General bed mobility comments: cues for initiation    Transfers Overall transfer level: Needs assistance Equipment used: Rolling walker (2 wheels) Transfers: Sit to/from Stand Sit to Stand: Supervision           General transfer comment: no physical assist needed     Balance Overall balance assessment: Needs assistance Sitting-balance support: Feet supported Sitting balance-Leahy Scale: Good     Standing balance support: Single extremity supported, No upper extremity supported, During functional activity Standing balance-Leahy Scale: Fair           ADL either performed or assessed with clinical judgement   ADL Overall ADL's : Needs assistance/impaired                 Upper Body Dressing : Minimal assistance;Sitting Upper Body Dressing Details (indicate cue type and reason): completed EOB, needed assistance for sequencing Lower Body Dressing: Set up;Sitting/lateral leans Lower Body Dressing Details (indicate cue type and reason): at bed level, donned bilat socks Toilet Transfer: Minimal assistance;Ambulation;Rolling walker (2 wheels)           Functional mobility during ADLs: Minimal assistance;Rolling walker (2 wheels);Cueing for safety General ADL Comments: pt with R drift during hallway ambulation, no carry of cues and required constant re-direction.    Extremity/Trunk Assessment Upper Extremity Assessment Upper Extremity Assessment: Overall WFL for tasks  assessed   Lower Extremity Assessment Lower Extremity  Assessment: Defer to PT evaluation        Vision   Vision Assessment?: No apparent visual deficits Additional Comments: no apparent deficits however pt with R drift when ambulating   Perception Perception Perception: Not tested   Praxis Praxis Praxis: Not tested    Cognition Arousal/Alertness: Awake/alert Behavior During Therapy: Flat affect Overall Cognitive Status: Impaired/Different from baseline Area of Impairment: Attention, Memory, Following commands, Safety/judgement, Awareness, Problem solving                   Current Attention Level: Sustained Memory: Decreased short-term memory Following Commands: Follows one step commands consistently Safety/Judgement: Decreased awareness of safety, Decreased awareness of deficits Awareness: Intellectual Problem Solving: Slow processing, Decreased initiation, Difficulty sequencing, Requires verbal cues General Comments: requires constant re-direction. no carry over of education/cues. says off-the-wall things like "im drunk" "I do this for a living" and attempting to don blanket like pants despite cues              General Comments VS on RA    Pertinent Vitals/ Pain       Pain Assessment Pain Assessment: Faces Faces Pain Scale: Hurts a little bit Pain Location: L foot with ambulation, limping Pain Descriptors / Indicators: Grimacing, Guarding Pain Intervention(s): Limited activity within patient's tolerance, Monitored during session   Frequency  Min 2X/week        Progress Toward Goals  OT Goals(current goals can now be found in the care plan section)  Progress towards OT goals: Progressing toward goals  Acute Rehab OT Goals Patient Stated Goal: "get up and move around" OT Goal Formulation: With patient Time For Goal Achievement: 02/26/21 Potential to Achieve Goals: Fair ADL Goals Pt Will Perform Grooming: with modified independence;standing Pt Will Perform Lower Body Bathing: with modified  independence;sit to/from stand Pt Will Perform Lower Body Dressing: with modified independence;sit to/from stand Pt Will Transfer to Toilet: with modified independence;ambulating Pt Will Perform Toileting - Clothing Manipulation and hygiene: with modified independence;sit to/from stand Additional ADL Goal #1: Pt will demonstrate decreased pain to LEs by tolerating light touch as needed for LE ADLs in order to increase independence with and tolerance for self care.  Plan Discharge plan remains appropriate       AM-PAC OT "6 Clicks" Daily Activity     Outcome Measure   Help from another person eating meals?: None Help from another person taking care of personal grooming?: A Little Help from another person toileting, which includes using toliet, bedpan, or urinal?: A Lot Help from another person bathing (including washing, rinsing, drying)?: A Lot Help from another person to put on and taking off regular upper body clothing?: A Little Help from another person to put on and taking off regular lower body clothing?: A Little 6 Click Score: 17    End of Session Equipment Utilized During Treatment: Gait belt;Rolling walker (2 wheels)  OT Visit Diagnosis: Unsteadiness on feet (R26.81);Muscle weakness (generalized) (M62.81);Dizziness and giddiness (R42) Pain - Right/Left: Left Pain - part of body: Leg;Ankle and joints of foot   Activity Tolerance Patient tolerated treatment well   Patient Left in bed;with call bell/phone within reach   Nurse Communication Mobility status        Time: 8416-6063 OT Time Calculation (min): 17 min  Charges: OT General Charges $OT Visit: 1 Visit OT Treatments $Therapeutic Activity: 8-22 mins   Steve [name] A Steve [name] 02/23/2021, 3:33 PM

## 2021-02-24 MED ORDER — CLONAZEPAM 0.5 MG PO TABS
0.5000 mg | ORAL_TABLET | Freq: Once | ORAL | Status: AC
  Administered 2021-02-24: 0.5 mg via ORAL
  Filled 2021-02-24: qty 1

## 2021-02-24 MED ORDER — CLONAZEPAM 0.5 MG PO TABS
1.0000 mg | ORAL_TABLET | Freq: Two times a day (BID) | ORAL | Status: DC
  Administered 2021-02-24 – 2021-02-25 (×2): 1 mg via ORAL
  Filled 2021-02-24 (×2): qty 2

## 2021-02-24 NOTE — Progress Notes (Signed)
Patient has been removed from enclosure bed and assisted into chair with chair alarm on.

## 2021-02-24 NOTE — Progress Notes (Signed)
Pt ate all of his breakfast this morning. He was talking to his son in the room but I told him it was only me and him. He also said "What is that little girl doing, I can't keep up with her". I said there was no little girl in the room. Pt is redirectable but has hallucinations. I let the primary nurse know as well.

## 2021-02-24 NOTE — Progress Notes (Addendum)
Progress Note   Patient: Steve [name] JJO:[id number] DOB: 28-Dec-1965 DOA: 01/05/2021     50 DOS: the patient was seen and examined on 02/24/2021   Brief hospital course: Steve [name] is a 56 y.o. male with history of major depression, anxiety who was in a residential ETOH treatment center for last 5 months. He was brought to the ED on 12/20 from a group home for convalescence, combativeness and altered sensorium.  Per collateral history, patient was not acting typical for last few days.  In the ED, patient was confused, combative. Labs showed AKI with creatinine of 3.27, rhabdomyolysis, abnormal LFTs, elevated WBC count at 23.7 and lactic acid level at 7.6. 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 Blood culture was sent, empiric antibiotic started.  IV fluid resuscitation started and patient was initially admitted to ICU. 12/21, underwent LP which showed staph in CSF, felt to be contaminant.  EEG did not show any seizures 12/24, HD catheter was placed but patient did not require HD as creatinine started to improve 12/31, with clinical improvement, patient was transferred out of ICU to [facility]. 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 Also evidence of prior lacunar infarct right basal ganglia Cognitive screening revealed significant cognitive deficits Continue Prozac, BuSpar, trazodone-has known history of major depressive disorder-continue Zyprexa and Depakote for behavioral issues and cognitive impairment-2/1 will increase Zyprexa doses today Follow QTC,  platelets and LFTs periodically while on Zyprexa and Depakote 2/2 LFTs are normal, platelets are normal; QTC 469 ms on 2/1 --Attempted discontinuation of Posey enclosure bed on 02/21/2021, tolerated 4 hours before increased agitation and patient returned to enclosure bed.   -2/7: Added Klonopin-behaviors better but still impulsive.  Will increase Klonopin to 1 mg BID -2/8: Behaviors markedly improved as his mentation with the addition of benzodiazepine.  We will attempt to remove Posey enclosure bed.    Witnessed seizure ([name]) No apparent prior history of seizure disorder noting patient was not on AEDs prior to admission Unclear if withdrawal seizure or related to other substances EEG unremarkable.   Continue Keppra at recommendation of neurology  Acute kidney injury with rhabdomyolysis Resolved Creatinine peaked at 7.48 on 12/26.  Nephrology consulted with initial plans to initiate dialysis including placement of [facility] but renal function gradually improved and dialysis not needed and TDC removed  Possible DVT (deep venous thrombosis) ([name]) Per critical care note, hemodialysis cath had a clot on it when it was removed.  UE duplex  12/31: no DVT.    Aspiration pneumonia ([name]) Completed a course of Zosyn  Elevated transaminases at time of presentation Likely secondary to sepsis like physiology at presentation His AST and ALT were significantly elevated to over 700s on admission. Gradually improved to normal.   Right ventricular dysfunction- (present on admission) Echo 1/2 with EF 60 to 65%.  Cannot rule out a small PFO this finding determined to be clinically insignificant given the patient was asymptomatic  Physical deconditioning- (present on admission) Influenced by patient's poor cognition as well as prior heavy alcohol abuse and malnutrition before admission Therapy continues to recommend SNF placement due to level  of assist required with ADLs and IADLs as well as ongoing poor  cognition  Severe sepsis without septic shock ([name]) Ruled out Sepsis-like physiology secondary to presentation with profound dehydration, hypoperfusion from dehydration related hypotension seizure activity Chest work-up was negative        Subjective:  Much more alert and appropriate today.  He is sitting naked in the [place] enclosure bed.  Oriented to name place and year.  Engaging in appropriate conversational speech today.  I mentioned that I had adjusted his medications the day before he stated that he had noticed that yesterday he was feeling better.  Physical Exam: Vitals:   02/23/21 2008 02/24/21 0017 02/24/21 0409 02/24/21 0908  BP: 110/88 100/69 99/79 112/71  Pulse: 91 76 70 72  Resp: 17 15 16 18   Temp: (!) 97.5 F (36.4 C) (!) 97.5 F (36.4 C) (!) 97.5 F (36.4 C) 97.7 F (36.5 C)  TempSrc: Oral   Oral  SpO2: 100% 96% 97% 98%  Weight:      Height:       Constitutional: NAD, calm Respiratory: Posterior lung sounds clear, Normal respiratory effort. RA Cardiovascular: Regular pulse, no peripheral edema.  Normotensive.  S1-S2 Abdomen: no tenderness, no masses palpated. Bowel sounds positive. LBM 2/6 Neurologic: CN 2-12 grossly intact. Sensation intact, Strength 3+-4/5 x all 4 extremities.  Psychiatric: Alert and oriented times name, place and year.  Will somewhat confused in regards to short-term memory deficits.  Interacting more appropriately with speech and conversation  Data Reviewed: There are no new results to review at this time.  Family Communication: Patient only  Disposition: Status is: Inpatient Remains inpatient appropriate because: Safe discharge plan secondary to severe cognitive impairment requiring SNF placement.  Currently lacks funding.   DVT Prophylaxis  ., Enoxaparin (lovenox) injection 40 mg     Planned Discharge Destination:  Skilled nursing facility  COVID vaccination status: Unknown  Consultants: Nephrology Psychiatry     Procedures: EEG Echocardiogram TDC   Antibiotics: Unasyn x1 dose, cefepime x1 dose, ceftriaxone x1 dose, vancomycin x2 doses     Time spent: 15 minutes  Author: , NP 02/24/2021 11:19 AM  For on call review [email or web address].

## 2021-02-24 NOTE — TOC Progression Note (Signed)
Transition of Care [facility]) - Progression Note    Patient Details  Name: Steve [name] MRN: [id number] Date of Birth: Sep 25, 1965  [facility] [place] [facility] CM/SW Contact  Curlene Labrum, RN Phone Number: 02/24/2021, 10:57 AM  Clinical Narrative:    CM met with the patient at the beside.  The patient with less agitation today and and Erin Hearing, NP plans to place orders for removal of Posey bed for safety measures.  CM and MSW with DTP Team will continue to follow the patient for TOC needs - and explore options for safe disposition.   Expected Discharge Plan:  (To be determined) Barriers to Discharge: Continued Medical Work up, Homeless with medical needs, Inadequate or no insurance  Expected Discharge Plan and Services Expected Discharge Plan:  (To be determined) In-house Referral: PCP / Health Connect Discharge Planning Services: CM Consult, [place] Program, Medication Assistance, Follow-up appt scheduled, [place] arrangements for the past 2 months: [place] (Patient was resident at [facility] prior to hospital admission.) Expected Discharge Date: 01/24/21                                     Social Determinants of Health (SDOH) Interventions    Readmission Risk Interventions No flowsheet data found.

## 2021-02-25 MED ORDER — CLONAZEPAM 0.5 MG PO TABS
1.0000 mg | ORAL_TABLET | Freq: Three times a day (TID) | ORAL | Status: DC
  Administered 2021-02-25 – 2021-03-09 (×36): 1 mg via ORAL
  Filled 2021-02-25 (×36): qty 2

## 2021-02-25 NOTE — TOC Progression Note (Signed)
Transition of Care [name]) - Progression Note    Patient Details  Name: Steve [name] MRN: [id number] Date of Birth: [birth year]  Transition of Care [facility]) CM/SW Contact  Curlene Labrum, RN Phone Number: 02/25/2021, 12:02 PM  Clinical Narrative:    CM met with the patient at the bedside for [facility] planning.  The patient is making improvements with cognition and impulsively and he is no longer requiring Posey bed for behaviors and is easily redirected.  PT/OT is recommending SNF placement.  [facility] and financial counseling with [place] is following the patient for Medicaid screening and disability application.  The patient will be faxed out in the hub to possible bed offers for LTC placement - pending communication with [place] for needed SNF/LTC placement.  LOG will need to be provided for placement since Medicaid application remains pending.  CM and MSW with DTP Team will continue to follow the patient for SNF/LTC placement.   Expected Discharge Plan:  (To be determined) Barriers to Discharge: Continued Medical Work up, Homeless with medical needs, Inadequate or no insurance  Expected Discharge Plan and Services Expected Discharge Plan:  (To be determined) In-house Referral: PCP / Health Connect Discharge Planning Services: CM Consult, [place] Program, Medication Assistance, Follow-up appt scheduled, [place] arrangements for the past 2 months: [place] (Patient was resident at [place] prior to hospital admission.) Expected Discharge Date: 01/24/21                                     Social Determinants of Health (SDOH) Interventions    Readmission Risk Interventions No flowsheet data found.

## 2021-02-25 NOTE — Progress Notes (Signed)
Physical Therapy Treatment Patient Details Name: Steve [name] MRN: [id number] DOB: 14-Nov-1965 Today's Date: 02/25/2021   History of Present Illness Pt is a 56 y.o. male admitted from group home for convalescence, combativeness and altered sensorium on 01/05/21 with witnessed seizure, AMS. 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 EEG unremarkable. LP on 12/21 showed staph in CSF, felt to be contaminant. Workup for AKI, rhabdomyolysis. Course complicated by c/o L foot pain; imaging negative for acute injury. Pt remains hospitalized due to difficult to place. Pt with psych consult who felt no need for inpt psych. No PMH in chart.    PT Comments    Pt's continues with significant cognitive impairment that is impairing mobility. Continue to recommend SNF at DC.    Recommendations for follow up therapy are one component of a multi-disciplinary discharge planning process, led by the attending physician.  Recommendations may be updated based on patient status, additional functional criteria and insurance authorization.  Follow Up Recommendations  Skilled nursing-short term rehab (<3 hours/day)     Assistance Recommended at Discharge Frequent or constant Supervision/Assistance  Patient can return home with the following A little help with walking and/or transfers;Assistance with cooking/housework;A little help with bathing/dressing/bathroom;Direct supervision/assist for financial management;Direct supervision/assist for medications management;Assist for transportation   Equipment Recommendations  Rolling walker (2 wheels)    Recommendations for Other Services       Precautions / Restrictions Precautions Precautions: Fall Precaution Comments: h/o L foot pain; in Posey bed Restrictions Other Position/Activity Restrictions: CAM boot not needed per ortho note     Mobility   Bed Mobility Overal bed mobility: Needs Assistance Bed Mobility: Supine to Sit, Sit to Supine     Supine to sit: Supervision Sit to supine: Supervision   General bed mobility comments: Supervision for safety    Transfers Overall transfer level: Needs assistance   Transfers: Sit to/from Stand Sit to Stand: Min guard           General transfer comment: min guard for unsteadiness progressing to min assist with time up.    Ambulation/Gait Ambulation/Gait assistance: Min assist, Mod assist   Assistive device: 1 person hand held assist, Rollator (4 wheels) Gait Pattern/deviations: Step-through pattern, Decreased stride length, Trunk flexed Gait velocity: decreased     General Gait Details: Assist for balance and constant cues to attend to task. Used rollator which pt initially able to use and min assist for balance but due to decr attention required mod assist after turning around and eventually had to have pt sit on rollator to return to room after pt began flexing forward at the hip almost leaning on top of rollator   Stairs             Wheelchair Mobility    Modified Rankin (Stroke Patients Only)       Balance Overall balance assessment: Needs assistance Sitting-balance support: Feet supported Sitting balance-Leahy Scale: Good     Standing balance support: Single extremity supported, During functional activity, Bilateral upper extremity supported Standing balance-Leahy Scale: Poor Standing balance comment: UE support and at times mod assist for dynamic                            Cognition Arousal/Alertness: Awake/alert Behavior During Therapy: Impulsive Overall Cognitive Status: Impaired/Different from baseline Area of Impairment: Orientation, Attention, Memory, Following commands, Safety/judgement, Awareness,  Problem solving                 Orientation Level: Time, Situation Current Attention Level: Sustained Memory: Decreased  short-term memory Following Commands: Follows one step commands with increased time Safety/Judgement: Decreased awareness of safety, Decreased awareness of deficits Awareness: Intellectual Problem Solving: Requires verbal cues, Requires tactile cues, Slow processing General Comments: Pt needed constant redirection to task        Exercises      General Comments        Pertinent Vitals/Pain Pain Assessment Faces Pain Scale: No hurt    Home Living                          Prior Function            PT Goals (current goals can now be found in the care plan section) Acute Rehab PT Goals PT Goal Formulation: Patient unable to participate in goal setting Time For Goal Achievement: 03/05/21 Potential to Achieve Goals: Fair Progress towards PT goals: Goals downgraded-see care plan    Frequency    Min 2X/week      PT Plan Current plan remains appropriate    Co-evaluation              AM-PAC PT "6 Clicks" Mobility   Outcome Measure  Help needed turning from your back to your side while in a flat bed without using bedrails?: None Help needed moving from lying on your back to sitting on the side of a flat bed without using bedrails?: A Little Help needed moving to and from a bed to a chair (including a wheelchair)?: A Lot Help needed standing up from a chair using your arms (e.g., wheelchair or bedside chair)?: A Lot Help needed to walk in hospital room?: A Lot Help needed climbing 3-5 steps with a railing? : A Lot 6 Click Score: 15    End of Session Equipment Utilized During Treatment: Gait belt Activity Tolerance: Other (comment) (Limited by poor cognition) Patient left: in bed;with call bell/phone within reach;with bed alarm set;with restraints reapplied Nurse Communication: Mobility status;Other (comment) (condom cath off on arrival) PT Visit Diagnosis: Other abnormalities of gait and mobility (R26.89);Other symptoms and signs involving the nervous  system [id number])     Time: [id number] PT Time Calculation (min) (ACUTE ONLY): 27 min  Charges:  $Gait Training: 23-37 mins                     [place] Pager [phone] Office [place] 02/25/2021, 5:24 PM

## 2021-02-25 NOTE — NC FL2 (Signed)
[place] MEDICAID FL2 LEVEL OF CARE SCREENING TOOL     IDENTIFICATION  Patient Name: Steve [name] Birthdate: 25-Feb-1965 Sex: male Admission Date (Current Location): 01/05/2021  [facility] and [place] Number:  Producer, television/film/video and Address:  [facility], [address] [place], [place] [address]      Provider Number: [id number]  Attending Physician Name and Address:  Elgergawy, Leana Roe, MD  Relative Name and Phone Number:  [name] - son - [phone]    Current Level of Care: Hospital Recommended Level of Care: Skilled Nursing Facility Prior Approval Number:    Date Approved/Denied:   PASRR Number: pending  Discharge Plan: SNF    Current Diagnoses: Patient Active Problem List   Diagnosis Date Noted   Sepsis [facility])    Witnessed seizure ([name]) 02/17/2021   Acute kidney injury with rhabdomyolysis 02/17/2021   Rhabdomyolysis 02/17/2021   Possible DVT (deep venous thrombosis) ([name]) 02/17/2021   Aspiration pneumonia ([name]) 02/17/2021   Elevated transaminases at time of presentation 02/17/2021   Alcoholic dementia ([name]) 02/04/2021   Cognitive impairment  2/2 Wernicke's encephalopathy and alcoholic dementia 02/04/2021   Physical deconditioning 02/04/2021   Right ventricular dysfunction 02/04/2021   Pressure injury of skin 01/13/2021   Fever    Severe sepsis without septic shock ([name])    Acute metabolic encephalopathy 01/05/2021    Orientation RESPIRATION BLADDER Height & Weight     Self, Place  Normal Incontinent Weight: 59.5 kg Height:  5\' 8"  (172.7 cm)  BEHAVIORAL SYMPTOMS/MOOD NEUROLOGICAL BOWEL NUTRITION STATUS    Convulsions/Seizures (vimpat 100 mg BID) Incontinent Diet (Regular Thin Diet)  AMBULATORY STATUS COMMUNICATION OF NEEDS Skin   Limited Assist Verbally Normal PU Stage 1 Dressing:  (Prn--to foot)                     Personal Care Assistance Level of Assistance  Bathing, Feeding, Dressing Bathing Assistance:  Limited assistance Feeding assistance: Limited assistance Dressing Assistance: Limited assistance     Functional Limitations Info  Sight, Hearing, Speech Sight Info: Adequate Hearing Info: Adequate Speech Info: Adequate    SPECIAL CARE FACTORS FREQUENCY  PT (By licensed PT), OT (By licensed OT)     PT Frequency: 2-3 times per week OT Frequency: 2-3 times per week            Contractures Contractures Info: Not present    Additional Factors Info  Code Status, Allergies, Psychotropic Code Status Info: Full code Allergies Info: NKDA Psychotropic Info: Buspar, Prozac, Neurontin, Keppra, Zyprexa, Trazodone, Depakene         Current Medications (02/25/2021):  This is the current hospital active medication list Current Facility-Administered Medications  Medication Dose Route Frequency Provider Last Rate Last Admin   0.9 %  sodium chloride infusion   Intravenous PRN Luciano Cutter, MD 10 mL/hr at 02/04/21 1457 New Bag at 02/04/21 1457   acetaminophen (TYLENOL) tablet 650 mg  650 mg Oral Q4H PRN Marinda Elk, MD   650 mg at 02/24/21 1459   albuterol (PROVENTIL) (2.5 MG/3ML) 0.083% nebulizer solution 2.5 mg  2.5 mg Nebulization Q3H PRN Karl Ito, MD   2.5 mg at 01/13/21 0233   amLODipine (NORVASC) tablet 2.5 mg  2.5 mg Oral Daily Dahal, Melina Schools, MD   2.5 mg at 02/25/21 0037   aspirin tablet 325 mg  325 mg Oral Daily Rejeana Brock, MD   325 mg at 02/25/21 0488   Or  aspirin suppository 300 mg  300 mg Rectal Daily Greta Doom, MD       busPIRone (BUSPAR) tablet 10 mg  10 mg Oral BID Olalere, Adewale A, MD   10 mg at 02/25/21 [id number]   clonazePAM (KLONOPIN) tablet 1 mg  1 mg Oral TID Samella Parr, NP       docusate sodium (COLACE) capsule 100 mg  100 mg Oral BID PRN Erick Colace, NP   100 mg at 01/13/21 1945   docusate sodium (COLACE) capsule 100 mg  100 mg Oral BID Spero Geralds, MD   100 mg at 02/25/21 0926   enoxaparin (LOVENOX) injection  40 mg  40 mg Subcutaneous Q24H [place] Eric J, DO   40 mg at 02/25/21 [id number]   feeding supplement (ENSURE ENLIVE / ENSURE PLUS) liquid 237 mL  237 mL Oral BID BM Dahal, Binaya, MD   237 mL at 02/24/21 1026   FLUoxetine (PROZAC) capsule 40 mg  40 mg Oral Daily Dahal, Binaya, MD   40 mg at [id number]   folic acid (FOLVITE) tablet 1 mg  1 mg Oral Daily Vann, Jessica U, DO   1 mg at 02/25/21 [id number]   gabapentin (NEURONTIN) capsule 600 mg  600 mg Oral TID Eulogio Bear U, DO   600 mg at 02/25/21 0926   levETIRAcetam (KEPPRA) 100 MG/ML solution 500 mg  500 mg Oral BID Shelly Coss, MD   500 mg at 02/25/21 0926   MEDLINE mouth rinse  15 mL Mouth Rinse BID Olalere, Adewale A, MD   15 mL at 02/25/21 0935   nicotine (NICODERM CQ - dosed in mg/24 hours) patch 21 mg  21 mg Transdermal Daily Dahal, Binaya, MD   21 mg at 02/25/21 0926   OLANZapine (ZYPREXA) tablet 10 mg  10 mg Oral QHS Samella Parr, NP   10 mg at 02/24/21 2221   OLANZapine (ZYPREXA) tablet 7.5 mg  7.5 mg Oral Daily Erin Hearing L, NP   7.5 mg at 02/25/21 0926   ondansetron (ZOFRAN) injection 4 mg  4 mg Intravenous Q8H PRN Margaretmary Lombard, MD   4 mg at 01/15/21 0208   polyethylene glycol (MIRALAX / GLYCOLAX) packet 17 g  17 g Oral Daily PRN Erick Colace, NP   17 g at 01/29/21 0827   polyethylene glycol (MIRALAX / GLYCOLAX) packet 17 g  17 g Oral Daily Spero Geralds, MD   17 g at 02/25/21 0926   traZODone (DESYREL) tablet 100 mg  100 mg Oral QHS Samella Parr, NP   100 mg at 02/24/21 2223   valproic acid (DEPAKENE) 250 MG capsule 500 mg  500 mg Oral BID Samella Parr, NP   500 mg at 02/25/21 [id number]     Discharge Medications: Please see discharge summary for a list of discharge medications.  Relevant Imaging Results:  Relevant Lab Results:   Additional Information SS# [id number]  Curlene Labrum, RN

## 2021-02-25 NOTE — Progress Notes (Signed)
Speech Language Pathology Treatment:    Patient Details Name: Steve [name] MRN: [id number] DOB: [birth year] Today's Date: 02/25/2021 Time:  -     Assessment / Plan / Recommendation Clinical Impression  Pt was seen for cognitive-linguistic treatment. He was seated reclined in regular hospital bed and his overall presentation appeared improved compared to when he was last seen by this SLP in January. Pt's case discussed with Revonda Standard, NP who advised that his meds have been adjusted. He required intermittent cues for focused and sustained attention during a meal-ordering task. Pt was able to attend to the menu for long enough to identify the limited items from the "sandwiches" and "main dishes"  sections of the menu. However, he then became distracted by auditory and visual hallucinations such as his "grandkid in the corner of the room" who was with his ex wife, and his brother who was reportedly talking in from of [place]. He demonstrated temporal orientation with cues. He completed a simple concrete reasoning task with 0% accuracy despite cues; pt's waning attention likely impacted his performance on this task. Pt was educated seven times about his having a condom cath; he intermittently reported recall of this information, but still subsequently required re-education. SLP will continue to follow pt.     HPI HPI: 56 y.o. male brought to the ED on 12/20 from sober living group home for combativeness and altered sensorium.  Per collateral history, patient was not acting typical for last few days.  In the ED, patient was confused, combative  Labs showed AKI with creatinine of 3.27, rhabdomyolysis, abnormal LFTs, elevated WBC count at 23.7 and lactic acid level at 7.6.  CT head negative for acute bleeding or infarct but showed prominent fluid space at the left cerebral pontine angle raising possibility of epidermoid arachnoid cyst  Blood culture was sent, empiric antibiotic started.  IV fluid resuscitation started and  patient was admitted to ICU.  12/21, underwent LP which showed staph in CSF, felt to be contaminant.  EEG did not show any seizures  12/24, HD catheter was placed but patient did not require HD as creatinine started to improve  12/31, with clinical improvement, patient was transferred out of ICU to [facility] 02/03/21 SLUMS score: 2/30. PMH: major depression, anxiety      SLP Plan  Continue with current plan of care      Recommendations for follow up therapy are one component of a multi-disciplinary discharge planning process, led by the attending physician.  Recommendations may be updated based on patient status, additional functional criteria and insurance authorization.    Recommendations                   Follow Up Recommendations: Skilled nursing-short term rehab (<3 hours/day) Assistance recommended at discharge: Frequent or constant Supervision/Assistance SLP Visit Diagnosis: Cognitive communication deficit (R41.841);Attention and concentration deficit Plan: Continue with current plan of care          Luana Tatro I. [name], MS, CCC-SLP Acute Rehabilitation Services Office number [phone] Pager [phone]   [name]  02/25/2021, 1:43 PM

## 2021-02-25 NOTE — Progress Notes (Addendum)
Progress Note   Patient: Steve [name] FTD:[id number] DOB: 02/19/1965 DOA: 01/05/2021     51 DOS: the patient was seen and examined on 02/25/2021   Brief hospital course: Steve [name] is a 56 y.o. male with history of major depression, anxiety who was in a residential ETOH treatment center for last 5 months. He was brought to the ED on 12/20 from a group home for convalescence, combativeness and altered sensorium.  Per collateral history, patient was not acting typical for last few days.  In the ED, patient was confused, combative. Labs showed AKI with creatinine of 3.27, rhabdomyolysis, abnormal LFTs, elevated WBC count at 23.7 and lactic acid level at 7.6. 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 Blood culture was sent, empiric antibiotic started.  IV fluid resuscitation started and patient was initially admitted to ICU. 12/21, underwent LP which showed staph in CSF, felt to be contaminant.  EEG did not show any seizures 12/24, HD catheter was placed but patient did not require HD as creatinine started to improve 12/31, with clinical improvement, patient was transferred out of ICU to [facility]. 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 Also evidence of prior lacunar infarct right basal ganglia Cognitive screening revealed significant cognitive deficits Continue Prozac, BuSpar, trazodone-has known history of major depressive disorder-continue Zyprexa and Depakote for behavioral issues and cognitive impairment-2/1 will increase Zyprexa doses today Follow QTC,  platelets and LFTs periodically while on Zyprexa and Depakote 2/2 LFTs are normal, platelets are normal; QTC 469 ms on 2/1   -Significant improvement in behaviors and impulsivity with the addition of Klonopin.  Posey enclosure bed has been discontinued -Patient still remains a little restless and he is able to convey that he is feeling the sensation as well.  Plan is to increase Klonopin to TID    Witnessed seizure ([name]) No apparent prior history of seizure disorder noting patient was not on AEDs prior to admission Unclear if withdrawal seizure or related to other substances EEG unremarkable.   Continue Keppra at recommendation of neurology  Acute kidney injury with rhabdomyolysis Resolved Creatinine peaked at 7.48 on 12/26.  Nephrology consulted with initial plans to initiate dialysis including placement of [facility] but renal function gradually improved and dialysis not needed and TDC removed  Possible DVT (deep venous thrombosis) ([name]) Per critical care note, hemodialysis cath had a clot on it when it was removed.  UE duplex  12/31: no DVT.    Aspiration pneumonia ([name]) Completed a course of Zosyn  Elevated transaminases at time of presentation Likely secondary to sepsis like physiology at presentation His AST and ALT were significantly elevated to over 700s on admission.  Resolved   Right ventricular dysfunction- (present on admission) Echo 1/2 with EF 60 to 65%.  Cannot rule out a small PFO this finding determined to be clinically insignificant given the patient was asymptomatic  Physical deconditioning- (present on admission) Influenced by patient's poor cognition as well as prior heavy alcohol abuse and malnutrition before admission Therapy continues to recommend SNF placement due to level of assist required with ADLs and IADLs as well as ongoing poor  cognition SLP working with patient on 2/9 and have also noted that patient's ability to stay focused and work with therapy has  greatly improved over the past several days  Severe sepsis without septic shock ([name]) Ruled out Sepsis-like physiology secondary to presentation with profound dehydration, hypoperfusion from dehydration related hypotension seizure activity Chest work-up was negative        Subjective:  Alert.  Sitting up in bed working with speech therapy.  Was able to relate that he is still a little jittery when I mention the possibility of increasing his Klonopin.  Physical Exam: Vitals:   02/24/21 1217 02/24/21 2025 02/25/21 0023 02/25/21 0757  BP: 115/79 109/72 100/72 104/72  Pulse: 87 84 97 81  Resp:  15 14 17   Temp:  98 F (36.7 C) 97.6 F (36.4 C) 97.6 F (36.4 C)  TempSrc:  Oral Axillary Oral  SpO2:  100% 97% 100%  Weight:      Height:       Constitutional: NAD, calm Respiratory: Posterior lung sounds clear, Normal respiratory effort. RA Cardiovascular: Regular pulse, no peripheral edema.  Normotensive.  S1-S2 Abdomen: no tenderness, no masses palpated. Bowel sounds positive. LBM 2/6 Neurologic: CN 2-12 grossly intact. Sensation intact, Strength 3+-4/5 x all 4 extremities.  Psychiatric: Alert and oriented times name, place and year.  Will somewhat confused in regards to short-term memory deficits.  Interacting more appropriately with speech and conversation  Data Reviewed: There are no new results to review at this time.  Family Communication:  Patient only  Disposition: Status is: Inpatient Remains inpatient appropriate because: Safe discharge plan secondary to severe cognitive impairment requiring SNF placement.  Currently lacks funding.     A physical therapy consult is indicated based on the patients mobility assessment.   Mobility Assessment (last 72 hours)     Mobility Assessment     Row Name 02/25/21 0900 02/23/21 1500 02/22/21 1604   Does patient have an order for bedrest or is patient medically unstable No - Continue assessment -- --   What is the highest  level of mobility based on the progressive mobility assessment? Level 5 (Walks with assist in room/hall) - Balance while stepping forward/back and can walk in room with assist - Complete Level 5 (Walks with assist in room/hall) - Balance while stepping forward/back and can walk in room with assist - Complete Level 5 (Walks with assist in room/hall) - Balance while stepping forward/back and can walk in room with assist - Complete   Is the above level different from baseline mobility prior to current illness? Yes - Recommend PT order -- --               DVT Prophylaxis  ., Enoxaparin (lovenox) injection 40 mg   Planned Discharge Destination:  Skilled nursing facility  COVID vaccination status: Unknown  Consultants: Nephrology Psychiatry    Procedures: EEG Echocardiogram TDC   Antibiotics: Unasyn x1 dose, cefepime x1 dose, ceftriaxone x1 dose, vancomycin x2 doses     Time spent: 15 minutes  Author: 04/22/21, NP 02/25/2021 10:05 AM  For on call review [email or web address].

## 2021-02-25 NOTE — NC FL2 (Signed)
[place] MEDICAID FL2 LEVEL OF CARE SCREENING TOOL     IDENTIFICATION  Patient Name: Steve [name] Birthdate: 25-Feb-1965 Sex: male Admission Date (Current Location): 01/05/2021  [facility] and [place] Number:  Producer, television/film/video and Address:  [facility], [address] [place], [place] [address]      Provider Number: [id number]  Attending Physician Name and Address:  Elgergawy, Leana Roe, MD  Relative Name and Phone Number:  [name] - son - [phone]    Current Level of Care: Hospital Recommended Level of Care: Skilled Nursing Facility Prior Approval Number:    Date Approved/Denied:   PASRR Number: pending  Discharge Plan: SNF    Current Diagnoses: Patient Active Problem List   Diagnosis Date Noted   Sepsis [facility])    Witnessed seizure ([name]) 02/17/2021   Acute kidney injury with rhabdomyolysis 02/17/2021   Rhabdomyolysis 02/17/2021   Possible DVT (deep venous thrombosis) ([name]) 02/17/2021   Aspiration pneumonia ([name]) 02/17/2021   Elevated transaminases at time of presentation 02/17/2021   Alcoholic dementia ([name]) 02/04/2021   Cognitive impairment  2/2 Wernicke's encephalopathy and alcoholic dementia 02/04/2021   Physical deconditioning 02/04/2021   Right ventricular dysfunction 02/04/2021   Pressure injury of skin 01/13/2021   Fever    Severe sepsis without septic shock ([name])    Acute metabolic encephalopathy 01/05/2021    Orientation RESPIRATION BLADDER Height & Weight     Self, Place  Normal Incontinent Weight: 59.5 kg Height:  5\' 8"  (172.7 cm)  BEHAVIORAL SYMPTOMS/MOOD NEUROLOGICAL BOWEL NUTRITION STATUS    Convulsions/Seizures (vimpat 100 mg BID) Incontinent Diet (Regular Thin Diet)  AMBULATORY STATUS COMMUNICATION OF NEEDS Skin   Limited Assist Verbally Normal PU Stage 1 Dressing:  (Prn--to foot)                     Personal Care Assistance Level of Assistance  Bathing, Feeding, Dressing Bathing Assistance:  Limited assistance Feeding assistance: Limited assistance Dressing Assistance: Limited assistance     Functional Limitations Info  Sight, Hearing, Speech Sight Info: Adequate Hearing Info: Adequate Speech Info: Adequate    SPECIAL CARE FACTORS FREQUENCY  PT (By licensed PT), OT (By licensed OT)     PT Frequency: 2-3 times per week OT Frequency: 2-3 times per week            Contractures Contractures Info: Not present    Additional Factors Info  Code Status, Allergies, Psychotropic Code Status Info: Full code Allergies Info: NKDA Psychotropic Info: Buspar, Prozac, Neurontin, Keppra, Zyprexa, Trazodone, Depakene         Current Medications (02/25/2021):  This is the current hospital active medication list Current Facility-Administered Medications  Medication Dose Route Frequency Provider Last Rate Last Admin   0.9 %  sodium chloride infusion   Intravenous PRN Luciano Cutter, MD 10 mL/hr at 02/04/21 1457 New Bag at 02/04/21 1457   acetaminophen (TYLENOL) tablet 650 mg  650 mg Oral Q4H PRN Marinda Elk, MD   650 mg at 02/24/21 1459   albuterol (PROVENTIL) (2.5 MG/3ML) 0.083% nebulizer solution 2.5 mg  2.5 mg Nebulization Q3H PRN Karl Ito, MD   2.5 mg at 01/13/21 0233   amLODipine (NORVASC) tablet 2.5 mg  2.5 mg Oral Daily Dahal, Melina Schools, MD   2.5 mg at 02/25/21 0037   aspirin tablet 325 mg  325 mg Oral Daily Rejeana Brock, MD   325 mg at 02/25/21 0488   Or  aspirin suppository 300 mg  300 mg Rectal Daily Greta Doom, MD       busPIRone (BUSPAR) tablet 10 mg  10 mg Oral BID Olalere, Adewale A, MD   10 mg at 02/25/21 [id number]   clonazePAM (KLONOPIN) tablet 1 mg  1 mg Oral TID Samella Parr, NP       docusate sodium (COLACE) capsule 100 mg  100 mg Oral BID PRN Erick Colace, NP   100 mg at 01/13/21 1945   docusate sodium (COLACE) capsule 100 mg  100 mg Oral BID Spero Geralds, MD   100 mg at 02/25/21 0926   enoxaparin (LOVENOX) injection  40 mg  40 mg Subcutaneous Q24H [place] Eric J, DO   40 mg at 02/25/21 [id number]   feeding supplement (ENSURE ENLIVE / ENSURE PLUS) liquid 237 mL  237 mL Oral BID BM Dahal, Binaya, MD   237 mL at 02/24/21 1026   FLUoxetine (PROZAC) capsule 40 mg  40 mg Oral Daily Dahal, Binaya, MD   40 mg at [id number]   folic acid (FOLVITE) tablet 1 mg  1 mg Oral Daily Vann, Jessica U, DO   1 mg at 02/25/21 [id number]   gabapentin (NEURONTIN) capsule 600 mg  600 mg Oral TID Eulogio Bear U, DO   600 mg at 02/25/21 0926   levETIRAcetam (KEPPRA) 100 MG/ML solution 500 mg  500 mg Oral BID Shelly Coss, MD   500 mg at 02/25/21 0926   MEDLINE mouth rinse  15 mL Mouth Rinse BID Olalere, Adewale A, MD   15 mL at 02/25/21 0935   nicotine (NICODERM CQ - dosed in mg/24 hours) patch 21 mg  21 mg Transdermal Daily Dahal, Binaya, MD   21 mg at 02/25/21 0926   OLANZapine (ZYPREXA) tablet 10 mg  10 mg Oral QHS Samella Parr, NP   10 mg at 02/24/21 2221   OLANZapine (ZYPREXA) tablet 7.5 mg  7.5 mg Oral Daily Erin Hearing L, NP   7.5 mg at 02/25/21 0926   ondansetron (ZOFRAN) injection 4 mg  4 mg Intravenous Q8H PRN Margaretmary Lombard, MD   4 mg at 01/15/21 0208   polyethylene glycol (MIRALAX / GLYCOLAX) packet 17 g  17 g Oral Daily PRN Erick Colace, NP   17 g at 01/29/21 0827   polyethylene glycol (MIRALAX / GLYCOLAX) packet 17 g  17 g Oral Daily Spero Geralds, MD   17 g at 02/25/21 0926   traZODone (DESYREL) tablet 100 mg  100 mg Oral QHS Samella Parr, NP   100 mg at 02/24/21 2223   valproic acid (DEPAKENE) 250 MG capsule 500 mg  500 mg Oral BID Samella Parr, NP   500 mg at 02/25/21 [id number]     Discharge Medications: Please see discharge summary for a list of discharge medications.  Relevant Imaging Results:  Relevant Lab Results:   Additional Information SS# [id number]  Curlene Labrum, RN

## 2021-02-26 MED ORDER — RIVAROXABAN 10 MG PO TABS
10.0000 mg | ORAL_TABLET | Freq: Every day | ORAL | Status: DC
  Administered 2021-02-27 – 2021-05-14 (×76): 10 mg via ORAL
  Filled 2021-02-26 (×80): qty 1

## 2021-02-26 MED ORDER — RIVAROXABAN 10 MG PO TABS
10.0000 mg | ORAL_TABLET | Freq: Every day | ORAL | Status: DC
  Filled 2021-02-26: qty 1

## 2021-02-26 MED ORDER — OLANZAPINE 2.5 MG PO TABS
15.0000 mg | ORAL_TABLET | Freq: Every day | ORAL | Status: DC
  Administered 2021-02-26: 15 mg via ORAL
  Filled 2021-02-26: qty 6

## 2021-02-26 MED ORDER — VALPROIC ACID 250 MG PO CAPS
750.0000 mg | ORAL_CAPSULE | Freq: Two times a day (BID) | ORAL | Status: DC
  Administered 2021-02-26 – 2021-03-09 (×22): 750 mg via ORAL
  Filled 2021-02-26 (×23): qty 3

## 2021-02-26 NOTE — Progress Notes (Addendum)
Speech Language Pathology Treatment: Cognitive-Linquistic  Patient Details Name: Steve [name] MRN: [id number] DOB: 1965/10/15 Today's Date: 02/26/2021 Time: 1224-8250 SLP Time Calculation (min) (ACUTE ONLY): 15 min  Assessment / Plan / Recommendation Clinical Impression  Steve [name] was seen for cognitive-linguistic treatment. He was recently transferred back to bed by NT due to the frequency with which he was attempting to get out of the recliner. Visual hallucinations continue to be noted based on Steve [name]'s reports. He required frequent cues for attention despite reduction of auditory distracters. He required frequent cues for reasoning. He demonstrated 0% accuracy with delayed (1-minute) recall of single photos increasing to 100% with verbal prompts. Steve [name] was educated on use of the call bell to request help, but continued reinforcement will be necessary. SLP will continue to follow Steve [name].    HPI HPI: Steve [name] is a 56 y.o. male admitted from group home (substance abuse rehab?) on 01/05/21 with witnessed seizure, AMS. 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 EEG unremarkable. LP on 12/21 showed staph in CSF, felt to be contaminant. Workup for AKI, rhabdomyolysis. Dx Uremic encephalopathy. Course complicated by c/o L foot pain; imaging negative for acute injury. Steve [name] remains hospitalized due to difficulty with placement. SLP evaluation on 01/26/21: 9/27 on Mini Mental State Exam, SLUMS 02/03/21: 2/30; SLUMS 1/27: 1/30. Psych consulted and as of 1/16, Steve [name] did not meet criteria for inpatient psychiatric admission.      SLP Plan  Continue with current plan of care      Recommendations for follow up therapy are one component of a multi-disciplinary discharge planning process, led by the attending physician.  Recommendations may be updated based on patient status, additional functional criteria and insurance  authorization.    Recommendations                   Oral Care Recommendations: Oral care BID Follow Up Recommendations: Skilled nursing-short term rehab (<3 hours/day) Assistance recommended at discharge: Frequent or constant Supervision/Assistance SLP Visit Diagnosis: Cognitive communication deficit (R41.841);Attention and concentration deficit Plan: Continue with current plan of care         Maahir Horst I. [name], MS, CCC-SLP Acute Rehabilitation Services Office number [phone] Pager [phone]   [name]  02/26/2021, 10:38 AM

## 2021-02-26 NOTE — Progress Notes (Signed)
Occupational Therapy Treatment Patient Details Name: Steve [name] MRN: [id number] DOB: [birth year] Today's Date: 02/26/2021   History of present illness Pt is a 56 y.o. male admitted from group home for convalescence, combativeness and altered sensorium on 01/05/21 with witnessed seizure, AMS. 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 EEG unremarkable. LP on 12/21 showed staph in CSF, felt to be contaminant. Workup for AKI, rhabdomyolysis. Course complicated by c/o L foot pain; imaging negative for acute injury. Pt remains hospitalized due to difficult to place. Pt with psych consult who felt no need for inpt psych. No PMH in chart.   OT comments  Pt limited by cognition. This session, pt presented with decreased balance, requiring increased assist to maintain upright posture. Pt continuing to only follow 25% of simple commands and has decreased recall and safety/deficits awareness. OT will continue to follow acutely.    Recommendations for follow up therapy are one component of a multi-disciplinary discharge planning process, led by the attending physician.  Recommendations may be updated based on patient status, additional functional criteria and insurance authorization.    Follow Up Recommendations  Skilled nursing-short term rehab (<3 hours/day)    Assistance Recommended at Discharge Frequent or constant Supervision/Assistance  Patient can return home with the following  A little help with walking and/or transfers;A little help with bathing/dressing/bathroom;Assistance with cooking/housework;Assist for transportation;Help with stairs or ramp for entrance   Equipment Recommendations  None recommended by OT    Recommendations for Other Services      Precautions / Restrictions Precautions Precautions: Fall Precaution Comments: h/o L foot pain; in Posey  bed Restrictions Weight Bearing Restrictions: No Other Position/Activity Restrictions: CAM boot not needed per ortho note       Mobility Bed Mobility Overal bed mobility: Needs Assistance Bed Mobility: Supine to Sit, Sit to Supine     Supine to sit: Supervision Sit to supine: Supervision   General bed mobility comments: Supervision for safety    Transfers Overall transfer level: Needs assistance Equipment used: None Transfers: Sit to/from Stand Sit to Stand: Mod assist           General transfer comment: Pt with poor balance this session, requiring mod A to remain upright and complete transfers     Balance Overall balance assessment: Needs assistance Sitting-balance support: Feet supported Sitting balance-Leahy Scale: Good     Standing balance support: Single extremity supported, During functional activity, Bilateral upper extremity supported Standing balance-Leahy Scale: Poor Standing balance comment: UE support and at times mod assist for dynamic                           ADL either performed or assessed with clinical judgement   ADL Overall ADL's : Needs assistance/impaired                     Lower Body Dressing: Supervision/safety;Sitting/lateral leans;Sit to/from stand Lower Body Dressing Details (indicate cue type and reason): sup for safety Toilet Transfer: Moderate assistance;Stand-pivot Toilet Transfer Details (indicate cue type and reason): Pt with difficulty maintaining standing balance this session         Functional mobility during ADLs: Moderate assistance General ADL Comments: Pt with limited mobility this session due to poor balance and cognition.    Extremity/Trunk Assessment              Vision  Perception     Praxis      Cognition Arousal/Alertness: Awake/alert Behavior During Therapy: Impulsive Overall Cognitive Status: Impaired/Different from baseline Area of Impairment: Orientation, Attention,  Memory, Following commands, Safety/judgement, Awareness, Problem solving                 Orientation Level: Time, Situation Current Attention Level: Sustained Memory: Decreased short-term memory Following Commands: Follows one step commands with increased time Safety/Judgement: Decreased awareness of safety, Decreased awareness of deficits Awareness: Intellectual Problem Solving: Requires verbal cues, Requires tactile cues, Slow processing General Comments: Pt needed constant redirection to task        Exercises      Shoulder Instructions       General Comments VSS on RA    Pertinent Vitals/ Pain       Pain Assessment Pain Assessment: No/denies pain  Home Living                                          Prior Functioning/Environment              Frequency  Min 2X/week        Progress Toward Goals  OT Goals(current goals can now be found in the care plan section)  Progress towards OT goals: Not progressing toward goals - comment (Limited by cognition)  Acute Rehab OT Goals Patient Stated Goal: none stated OT Goal Formulation: With patient Time For Goal Achievement: 03/12/21 Potential to Achieve Goals: Fair ADL Goals Pt Will Perform Grooming: with modified independence;standing Pt Will Perform Lower Body Bathing: with modified independence;sit to/from stand Pt Will Perform Lower Body Dressing: with modified independence;sit to/from stand Pt Will Transfer to Toilet: with modified independence;ambulating Pt Will Perform Toileting - Clothing Manipulation and hygiene: with modified independence;sit to/from stand Additional ADL Goal #1: Pt will demonstrate decreased pain to LEs by tolerating light touch as needed for LE ADLs in order to increase independence with and tolerance for self care.  Plan Discharge plan remains appropriate    Co-evaluation                 AM-PAC OT "6 Clicks" Daily Activity     Outcome Measure   Help  from another person eating meals?: None Help from another person taking care of personal grooming?: A Little Help from another person toileting, which includes using toliet, bedpan, or urinal?: A Lot Help from another person bathing (including washing, rinsing, drying)?: A Lot Help from another person to put on and taking off regular upper body clothing?: A Little Help from another person to put on and taking off regular lower body clothing?: A Little 6 Click Score: 17    End of Session Equipment Utilized During Treatment: Gait belt  OT Visit Diagnosis: Unsteadiness on feet (R26.81);Muscle weakness (generalized) (M62.81);Dizziness and giddiness (R42) Pain - Right/Left: Left Pain - part of body: Leg;Ankle and joints of foot   Activity Tolerance Patient tolerated treatment well   Patient Left in chair;with call bell/phone within reach;with chair alarm set;with restraints reapplied   Nurse Communication Mobility status        Time: 1478-2956 OT Time Calculation (min): 23 min  Charges: OT General Charges $OT Visit: 1 Visit OT Treatments $Therapeutic Activity: 23-37 mins  Kanai Berrios H., OTR/L Acute Rehabilitation  Jayan Raymundo Elane Daron Stutz 02/26/2021, 8:12 PM

## 2021-02-26 NOTE — Progress Notes (Signed)
Progress Note   Patient: Steve [name] TKZ:[id number] DOB: 1965/04/04 DOA: 01/05/2021     52 DOS: the patient was seen and examined on 02/26/2021   Brief hospital course: Steve [name] is a 56 y.o. male with history of major depression, anxiety who was in a residential ETOH treatment center for last 5 months. He was brought to the ED on 12/20 from a group home for convalescence, combativeness and altered sensorium.  Per collateral history, patient was not acting typical for last few days.  In the ED, patient was confused, combative. Labs showed AKI with creatinine of 3.27, rhabdomyolysis, abnormal LFTs, elevated WBC count at 23.7 and lactic acid level at 7.6. 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 Blood culture was sent, empiric antibiotic started.  IV fluid resuscitation started and patient was initially admitted to ICU. 12/21, underwent LP which showed staph in CSF, felt to be contaminant.  EEG did not show any seizures 12/24, HD catheter was placed but patient did not require HD as creatinine started to improve 12/31, with clinical improvement, patient was transferred out of ICU to [facility]. 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 Also evidence of prior lacunar infarct right basal ganglia Cognitive screening revealed significant cognitive deficits Continue Prozac, BuSpar, trazodone-has known history of major depressive disorder-continue Zyprexa and Depakote for behavioral issues and cognitive impairment-2/1 will increase Zyprexa doses today Follow QTC,  platelets and LFTs periodically while on Zyprexa and Depakote 2/2 LFTs are normal, platelets are normal; QTC 469 ms on 2/1   -Significant improvement in behaviors and impulsivity with the addition of Klonopin.  Posey enclosure bed has been discontinued -Continue Klonopin  TID -Remains impulsive so Posey belt restraint in place.  Also having visual hallucinations -Increase Depakote to 750 mg BID with impulsivity and have increased at bedtime Zyprexa.    Witnessed seizure ([name]) No apparent prior history of seizure disorder noting patient was not on AEDs prior to admission Unclear if withdrawal seizure or related to other substances EEG unremarkable.   Continue Keppra at recommendation of neurology  Acute kidney injury with rhabdomyolysis Resolved Creatinine peaked at 7.48 on 12/26.  Nephrology consulted with initial plans to initiate dialysis including placement of [facility] but renal function gradually improved and dialysis not needed and TDC removed  Possible DVT (deep venous thrombosis) ([name]) Per critical care note, hemodialysis cath had a clot on it when it was removed.  UE duplex  12/31: no DVT.    Aspiration pneumonia ([name]) Completed a course of Zosyn  Elevated transaminases at time of presentation Likely secondary to sepsis like physiology at presentation His AST and ALT were significantly elevated to over 700s on admission.  Resolved   Right ventricular dysfunction- (present on admission) Echo 1/2 with EF 60 to 65%.  Cannot rule out a small PFO this finding determined to be clinically insignificant given the patient was asymptomatic  Physical deconditioning- (present on admission) Influenced by patient's poor cognition as well as prior heavy alcohol abuse and malnutrition before admission Therapy continues to recommend SNF placement due to level of assist required with ADLs and IADLs as well  as ongoing poor cognition SLP working with patient on 2/9 and have also noted that  patient's ability to stay focused and work with therapy has greatly improved over the past several days  Severe sepsis without septic shock ([name]) Ruled out Sepsis-like physiology secondary to presentation with profound dehydration, hypoperfusion from dehydration related hypotension seizure activity Chest work-up was negative        Subjective:  Alert.  Wiggling around against belt restraint.  Was having word finding difficulty but was able to let me know that he was frustrated with receiving shots.  I asked him if he wanted me to change the shot to a pill and he said yes.  Later while I was still in the room working on the computer he pointed over to the window and was talking to someone and asked me if I saw him over there and I said no  Physical Exam: Vitals:   02/26/21 0114 02/26/21 0424 02/26/21 0749 02/26/21 1128  BP: 107/82 103/72 105/83 105/84  Pulse: 75 79 86 91  Resp:  16 16 18   Temp:  98.6 F (37 C) 98.1 F (36.7 C) 98.2 F (36.8 C)  TempSrc:  Axillary Oral Oral  SpO2:  99% 100% 100%  Weight:      Height:       Constitutional: NAD, calm Respiratory: Posterior lung sounds clear, Normal respiratory effort. RA Cardiovascular: Regular pulse, no peripheral edema.  Normotensive.  S1-S2 Abdomen: no tenderness, no masses palpated. Bowel sounds positive. LBM 2/6 Neurologic: CN 2-12 grossly intact. Sensation intact, Strength 3+-4/5 x all 4 extremities.  Psychiatric: Alert and oriented times name only.  Very confused today and having some visual hallucinations.  Requiring belt restraint to prevent self injury.  Is very fluent in speech even when he is having word finding difficulty and is attempting to carry on appropriate conversations.  Remains impulsive  Data Reviewed: There are no new results to review at this time.  Family Communication:  Patient only  Disposition: Status is: Inpatient Remains inpatient appropriate because: Safe discharge plan secondary to severe  cognitive impairment requiring SNF placement.  Currently lacks funding.     A physical therapy consult is indicated based on the patients mobility assessment.   Mobility Assessment (last 72 hours)     Mobility Assessment     Row Name 02/25/21 1700 02/25/21 0900 02/23/21 1500   Does patient have an order for bedrest or is patient medically unstable -- No - Continue assessment --   What is the highest level of mobility based on the progressive mobility assessment? Level 5 (Walks with assist in room/hall) - Balance while stepping forward/back and can walk in room with assist - Complete Level 5 (Walks with assist in room/hall) - Balance while stepping forward/back and can walk in room with assist - Complete Level 5 (Walks with assist in room/hall) - Balance while stepping forward/back and can walk in room with assist - Complete   Is the above level different from baseline mobility prior to current illness? -- Yes - Recommend PT order --               DVT Prophylaxis  .Rivaroxaban (xarelto) tablet 10 mg , Rivaroxaban (xarelto) tablet 10 mg   Planned Discharge Destination:  Skilled nursing facility  COVID vaccination status: Unknown  Consultants: Nephrology Psychiatry    Procedures: EEG Echocardiogram TDC   Antibiotics: Unasyn x1 dose, cefepime x1 dose, ceftriaxone x1 dose, vancomycin x2 doses     Time spent:  15 minutes  Author: Junious Silk, NP 02/26/2021 12:29 PM  For on call review [email or web address].

## 2021-02-27 DIAGNOSIS — R410 Disorientation, unspecified: Secondary | ICD-10-CM

## 2021-02-27 LAB — COMPREHENSIVE METABOLIC PANEL
ALT: 64 U/L — ABNORMAL HIGH (ref 0–44)
AST: 42 U/L — ABNORMAL HIGH (ref 15–41)
Albumin: 3.5 g/dL (ref 3.5–5.0)
Alkaline Phosphatase: 44 U/L (ref 38–126)
Anion gap: 9 (ref 5–15)
BUN: 16 mg/dL (ref 6–20)
CO2: 26 mmol/L (ref 22–32)
Calcium: 9.7 mg/dL (ref 8.9–10.3)
Chloride: 104 mmol/L (ref 98–111)
Creatinine, Ser: 0.64 mg/dL (ref 0.61–1.24)
GFR, Estimated: >60 mL/min (ref >60)
Glucose, Bld: 97 mg/dL (ref 70–99)
Potassium: 3.7 mmol/L (ref 3.5–5.1)
Sodium: 139 mmol/L (ref 135–145)
Total Bilirubin: 0.4 mg/dL (ref 0.3–1.2)
Total Protein: 6.8 g/dL (ref 6.5–8.1)

## 2021-02-27 LAB — CBC WITH DIFFERENTIAL/PLATELET
Abs Immature Granulocytes: 0.01 10*3/uL (ref 0.00–0.07)
Basophils Absolute: 0 10*3/uL (ref 0.0–0.1)
Basophils Relative: 1 %
Eosinophils Absolute: 0.2 10*3/uL (ref 0.0–0.5)
Eosinophils Relative: 5 %
HCT: 33.6 % — ABNORMAL LOW (ref 39.0–52.0)
Hemoglobin: 11.5 g/dL — ABNORMAL LOW (ref 13.0–17.0)
Immature Granulocytes: 0 %
Lymphocytes Relative: 55 %
Lymphs Abs: 2.4 10*3/uL (ref 0.7–4.0)
MCH: 32.2 pg (ref 26.0–34.0)
MCHC: 34.2 g/dL (ref 30.0–36.0)
MCV: 94.1 fL (ref 80.0–100.0)
Monocytes Absolute: 0.6 10*3/uL (ref 0.1–1.0)
Monocytes Relative: 14 %
Neutro Abs: 1.1 10*3/uL — ABNORMAL LOW (ref 1.7–7.7)
Neutrophils Relative %: 25 %
Platelets: 267 10*3/uL (ref 150–400)
RBC: 3.57 MIL/uL — ABNORMAL LOW (ref 4.22–5.81)
RDW: 12.8 % (ref 11.5–15.5)
WBC: 4.4 10*3/uL (ref 4.0–10.5)
nRBC: 0 % (ref 0.0–0.2)

## 2021-02-27 MED ORDER — OLANZAPINE 2.5 MG PO TABS
5.0000 mg | ORAL_TABLET | Freq: Every day | ORAL | Status: DC
  Administered 2021-02-28 – 2021-03-09 (×10): 5 mg via ORAL
  Filled 2021-02-27 (×11): qty 2

## 2021-02-27 MED ORDER — OLANZAPINE 2.5 MG PO TABS
7.5000 mg | ORAL_TABLET | Freq: Every day | ORAL | Status: DC
  Administered 2021-02-27 – 2021-03-09 (×11): 7.5 mg via ORAL
  Filled 2021-02-27 (×11): qty 3

## 2021-02-27 NOTE — Plan of Care (Signed)
  Problem: Activity: Goal: Risk for activity intolerance will decrease Outcome: Progressing   Problem: Nutrition: Goal: Adequate nutrition will be maintained Outcome: Progressing   Problem: Safety: Goal: Ability to remain free from injury will improve Outcome: Progressing   

## 2021-02-27 NOTE — Progress Notes (Addendum)
PROGRESS NOTE    Steve [name]  [id number] DOB: 1965/11/04 DOA: 01/05/2021 PCP: No primary care provider on file.    Chief Complaint  Patient presents with   Altered Mental Status   Seizures    Brief Narrative:   Steve [name] is a 56 y.o. male with history of major depression, anxiety who was in a residential ETOH treatment center for last 5 months. He was brought to the ED on 12/20 from a group home for convalescence, combativeness and altered sensorium.  Per collateral history, patient was not acting typical for last few days.   In the ED, patient was confused, combative. Labs showed AKI with creatinine of 3.27, rhabdomyolysis, abnormal LFTs, elevated WBC count at 23.7 and lactic acid level at 7.6. 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 Blood culture was sent, empiric antibiotic started.  IV fluid resuscitation started and patient was initially admitted to ICU. 12/21, underwent LP which showed staph in CSF, felt to be contaminant.  EEG did not show any seizures 12/24, HD catheter was placed but patient did not require HD as creatinine started to improve 12/31, with clinical improvement, patient was transferred out of ICU to [facility]. 1/18 Cognitive evaluations: 2 on the SLUMS, a 9 on the MMSE, and a 0 on medi-cog and short blessed test   Assessment & Plan:   Active Problems:   Severe sepsis without septic shock ([name])   Cognitive impairment  2/2 Wernicke's encephalopathy and alcoholic dementia   Physical deconditioning   Right ventricular dysfunction   Witnessed seizure (Steve [name])   Acute kidney injury with rhabdomyolysis   Possible DVT (deep venous thrombosis) ([name])   Aspiration pneumonia ([name])   Elevated transaminases at time of presentation   Cognitive impairment  2/2 Wernicke's encephalopathy and alcoholic dementia- (present on admission) Initial MRI  revealed tiny acute infarct on the right but not significant enough to explain persistent cognitive and behavioral abnormalities. Also evidence of prior lacunar infarct right basal ganglia Cognitive screening revealed significant cognitive deficits Continue Prozac, BuSpar, trazodone-has known history of major depressive disorder-continue Zyprexa and Depakote for behavioral issues and cognitive impairment-2/1 will increase Zyprexa doses today Follow QTC, platelets and LFTs periodically while on Zyprexa and Depakote 2/2 LFTs are normal, platelets are normal; QTC 469 ms on 2/1   -Significant improvement in behaviors and impulsivity with the addition of Klonopin.  Posey enclosure bed has been discontinued -Continue Klonopin  TID -Remains impulsive so Posey belt restraint in place.  Also having visual hallucinations -Patient was somnolent 2/11, I have decreased his Zyprexa from 15 mg to 10 mg, and daytime Zyprexa from 7.5 mg to 5 mg, he appears to be more awake and interactive today, will continue at current dose.  .    Witnessed seizure (Steve [name]) No apparent prior history of seizure disorder noting patient was not on AEDs prior to admission Unclear if withdrawal seizure or related to other substances EEG unremarkable.   Continue Keppra at recommendation of neurology   Acute kidney injury with rhabdomyolysis Resolved Creatinine peaked at 7.48 on 12/26.  Nephrology consulted with initial plans to initiate dialysis including placement of [facility] but renal function gradually improved and dialysis not needed and TDC removed   Possible DVT (deep venous thrombosis) (Mardela [name]) Per critical care note, hemodialysis cath had a clot on it when it was removed.  UE duplex  12/31: no DVT.  Aspiration pneumonia ([place]) Completed a course of Zosyn   Elevated transaminases at time of presentation Likely secondary to sepsis like physiology at presentation His AST and ALT were significantly elevated to over 700s on  admission.  Resolved     Right ventricular dysfunction- (present on admission) Echo 1/2 with EF 60 to 65%.  Cannot rule out a small PFO this finding determined to be clinically insignificant given the patient was asymptomatic   Physical deconditioning- (present on admission) Influenced by patient's poor cognition as well as prior heavy alcohol abuse and malnutrition before admission Therapy continues to recommend SNF placement due to level of assist required with ADLs and IADLs as well as ongoing poor cognition SLP working with patient on 2/9 and have also noted that patient's ability to stay focused and work with therapy has greatly improved over the past several days   Severe sepsis without septic shock ([place]) Ruled out Sepsis-like physiology secondary to presentation with profound dehydration, hypoperfusion from dehydration related hypotension seizure activity Chest work-up was negative       DVT prophylaxis: xarelto Code Status: Full Disposition:  Remains inpatient appropriate because: remains altered     Subjective:  No significant events overnight as discussed with staff, this morning patient himself denies any complaints, he is eating breakfast.   Objective: Vitals:   02/26/21 2020 02/26/21 2342 02/27/21 0501 02/27/21 0744  BP: 134/89 126/79 96/69 100/76  Pulse: 60 81 81 82  Resp: 17 17 19 18   Temp: 97.8 F (36.6 C) 97.8 F (36.6 C) (!) 97.5 F (36.4 C) 97.7 F (36.5 C)  TempSrc: Oral Oral Oral Oral  SpO2: 100% 100% 99% 99%  Weight:   60.7 kg   Height:        Intake/Output Summary (Last 24 hours) at 02/27/2021 1036 Last data filed at 02/26/2021 2230 Gross per 24 hour  Intake 240 ml  Output 450 ml  Net -210 ml   Filed Weights   02/04/21 0412 02/21/21 0500 02/27/21 0501  Weight: 59.5 kg 59.5 kg 60.7 kg    Examination:  Thin appearing, awake, conversant, follows simple commands, confused with impaired judgment and insight. Symmetrical Chest wall  movement, Good air movement bilaterally, CTAB RRR,No Gallops,Rubs or new Murmurs, No Parasternal Heave +ve B.Sounds, Abd Soft, No tenderness, No rebound - guarding or rigidity. No Cyanosis, Clubbing or edema, No new Rash or bruise     Data Reviewed: I have personally reviewed following labs and imaging studies  CBC: Recent Labs  Lab 02/27/21 0157  WBC 4.4  NEUTROABS 1.1*  HGB 11.5*  HCT 33.6*  MCV 94.1  PLT 99991111    Basic Metabolic Panel: Recent Labs  Lab 02/23/21 0250 02/27/21 0157  NA 136 139  K 3.4* 3.7  CL 101 104  CO2 27 26  GLUCOSE 102* 97  BUN 23* 16  CREATININE 0.64 0.64  CALCIUM 9.5 9.7    GFR: Estimated Creatinine Clearance: 89.6 mL/min (by C-G formula based on SCr of 0.64 mg/dL).  Liver Function Tests: Recent Labs  Lab 02/23/21 0250 02/27/21 0157  AST 37 42*  ALT 39 64*  ALKPHOS 44 44  BILITOT 0.2* 0.4  PROT 7.4 6.8  ALBUMIN 4.2 3.5    CBG: Recent Labs  Lab 02/20/21 2145 02/21/21 0623  GLUCAP 95 88     No results found for this or any previous visit (from the past 240 hour(s)).       Radiology Studies: No results found.  Scheduled Meds:  amLODipine  2.5 mg Oral Daily   aspirin  325 mg Oral Daily   Or   aspirin  300 mg Rectal Daily   busPIRone  10 mg Oral BID   clonazePAM  1 mg Oral TID   docusate sodium  100 mg Oral BID   feeding supplement  237 mL Oral BID BM   FLUoxetine  40 mg Oral Daily   folic acid  1 mg Oral Daily   gabapentin  600 mg Oral TID   levETIRAcetam  500 mg Oral BID   mouth rinse  15 mL Mouth Rinse BID   nicotine  21 mg Transdermal Daily   OLANZapine  15 mg Oral QHS   OLANZapine  7.5 mg Oral Daily   polyethylene glycol  17 g Oral Daily   rivaroxaban  10 mg Oral Daily   traZODone  100 mg Oral QHS   valproic acid  750 mg Oral BID   Continuous Infusions:  sodium chloride 10 mL/hr at 02/04/21 1457     LOS: 66 days       Phillips Climes, MD [facility]   To contact the  attending provider between 7A-7P or the covering provider during after hours 7P-7A, please log into the web site [email or web address] and access using universal [place] password for that web site. If you do not have the password, please call the hospital operator.  02/27/2021, 10:36 AM

## 2021-02-28 DIAGNOSIS — R410 Disorientation, unspecified: Secondary | ICD-10-CM | POA: Insufficient documentation

## 2021-03-01 ENCOUNTER — Ambulatory Visit (INDEPENDENT_AMBULATORY_CARE_PROVIDER_SITE_OTHER): Payer: Self-pay | Admitting: Primary Care

## 2021-03-01 NOTE — TOC Progression Note (Signed)
Transition of Care [facility]) - Progression Note    Patient Details  Name: Steve [name] MRN: [id number] Date of Birth: [birth year]  [facility] CM/SW Contact  Janae Bridgeman, RN Phone Number: 03/01/2021, 2:59 PM  Clinical Narrative:    Patient continues to require [facility] for safety and medication management for behaviors and auditory hallucination.  The patient remains on DTP Team for placement - No payor source at this time.  Financial counseling is follow the patient for Medicaid application and disability.  No SNF offers and unable to place at this time with LOG due to barriers listed above.   Expected Discharge Plan: Skilled Nursing Facility Barriers to Discharge: Continued Medical Work up, Homeless with medical needs, Inadequate or no insurance  Expected Discharge Plan and Services Expected Discharge Plan: Skilled Nursing Facility In-house Referral: PCP / [facility] Discharge Planning Services: CM Consult, [facility] Program, Medication Assistance, Follow-up appt scheduled, Indigent [facility]   Living arrangements for the past 2 months: Group Home (Patient was resident at [facility] [place] prior to hospital admission.) Expected Discharge Date: 01/24/21                                     Social Determinants of Health (SDOH) Interventions    Readmission Risk Interventions No flowsheet data found.

## 2021-03-01 NOTE — Progress Notes (Signed)
PROGRESS NOTE    Steve [name]  IZT:[id number] DOB: 03/28/1965 DOA: 01/05/2021 PCP: No primary care provider on file.    Chief Complaint  Patient presents with   Altered Mental Status   Seizures    Brief Narrative:   Steve [name] is a 56 y.o. male with history of major depression, anxiety who was in a residential ETOH treatment center for last 5 months. He was brought to the ED on 12/20 from a group home for convalescence, combativeness and altered sensorium.  Per collateral history, patient was not acting typical for last few days.   In the ED, patient was confused, combative. Labs showed AKI with creatinine of 3.27, rhabdomyolysis, abnormal LFTs, elevated WBC count at 23.7 and lactic acid level at 7.6. 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 Blood culture was sent, empiric antibiotic started.  IV fluid resuscitation started and patient was initially admitted to ICU. 12/21, underwent LP which showed staph in CSF, felt to be contaminant.  EEG did not show any seizures 12/24, HD catheter was placed but patient did not require HD as creatinine started to improve 12/31, with clinical improvement, patient was transferred out of ICU to [facility]. 1/18 Cognitive evaluations: 2 on the SLUMS, a 9 on the MMSE, and a 0 on medi-cog and short blessed test   Assessment & Plan:   Active Problems:   Severe sepsis without septic shock ([name])   Cognitive impairment  2/2 Wernicke's encephalopathy and alcoholic dementia   Physical deconditioning   Right ventricular dysfunction   Witnessed seizure ([name])   Acute kidney injury with rhabdomyolysis   Possible DVT (deep venous thrombosis) ([name])   Aspiration pneumonia ([name])   Elevated transaminases at time of presentation   Confusion   Cognitive impairment  2/2 Wernicke's encephalopathy and alcoholic dementia- (present on  admission) Initial MRI revealed tiny acute infarct on the right but not significant enough to explain persistent cognitive and behavioral abnormalities. Also evidence of prior lacunar infarct right basal ganglia Cognitive screening revealed significant cognitive deficits Continue Prozac, BuSpar, trazodone-has known history of major depressive disorder-continue Zyprexa and Depakote for behavioral issues and cognitive impairment-2/1 will increase Zyprexa doses today Follow QTC, platelets and LFTs periodically while on Zyprexa and Depakote 2/2 LFTs are normal, platelets are normal; QTC 469 ms on 2/1   -Significant improvement in behaviors and impulsivity with the addition of Klonopin.  Posey enclosure bed has been discontinued -Continue Klonopin  TID -Remains impulsive so Posey belt restraint in place.  Also having visual hallucinations -Patient was somnolent 2/11, I have decreased his Zyprexa from 15 mg to 10 mg, and daytime Zyprexa from 7.5 mg to 5 mg, he appears to be more awake and interactive on current dose, which I will continue .  Witnessed seizure ([name]) No apparent prior history of seizure disorder noting patient was not on AEDs prior to admission Unclear if withdrawal seizure or related to other substances EEG unremarkable.   Continue Keppra at recommendation of neurology   Acute kidney injury with rhabdomyolysis Resolved Creatinine peaked at 7.48 on 12/26.  Nephrology consulted with initial plans to initiate dialysis including placement of [facility] but renal function gradually improved and dialysis not needed and TDC removed   Possible DVT (deep venous thrombosis) ([name]) Per critical care note, hemodialysis cath had a clot on it when it was removed.  UE duplex  12/31: no DVT.  Aspiration pneumonia ([name]) Completed a course of Zosyn   Elevated transaminases at time of presentation Likely secondary to sepsis like physiology at presentation His AST and ALT were significantly elevated to  over 700s on admission.  Resolved     Right ventricular dysfunction- (present on admission) Echo 1/2 with EF 60 to 65%.  Cannot rule out a small PFO this finding determined to be clinically insignificant given the patient was asymptomatic   Physical deconditioning- (present on admission) Influenced by patient's poor cognition as well as prior heavy alcohol abuse and malnutrition before admission Therapy continues to recommend SNF placement due to level of assist required with ADLs and IADLs as well as ongoing poor cognition SLP working with patient on 2/9 and have also noted that patient's ability to stay focused and work with therapy has greatly improved over the past several days   Severe sepsis without septic shock ([name]) Ruled out Sepsis-like physiology secondary to presentation with profound dehydration, hypoperfusion from dehydration related hypotension seizure activity Chest work-up was negative       DVT prophylaxis: xarelto Code Status: Full Disposition:  Remains inpatient appropriate because: remains altered     Subjective:  No significant events overnight as discussed with staff   Objective: Vitals:   02/28/21 1956 02/28/21 2334 03/01/21 0424 03/01/21 0735  BP: (!) 122/91 94/67 102/80 105/81  Pulse: 81 84 85 78  Resp: 18 18 16 16   Temp: 97.8 F (36.6 C) 98.2 F (36.8 C)  97.6 F (36.4 C)  TempSrc: Oral Oral  Oral  SpO2: 100% 100%    Weight:      Height:        Intake/Output Summary (Last 24 hours) at 03/01/2021 1313 Last data filed at 02/28/2021 2334 Gross per 24 hour  Intake 720 ml  Output 0 ml  Net 720 ml   Filed Weights   02/04/21 0412 02/21/21 0500 02/27/21 0501  Weight: 59.5 kg 59.5 kg 60.7 kg    Examination:  Awake Alert, pleasant, sitting in recliner wearing a Posey belt, conversant, follows commands.  Slow to respond, impaired cognition and insight. Symmetrical Chest wall movement, Good air movement bilaterally, CTAB RRR,No Gallops,Rubs  or new Murmurs, No Parasternal Heave +ve B.Sounds, Abd Soft, No tenderness, No rebound - guarding or rigidity. No Cyanosis, Clubbing or edema, No new Rash or bruise      Data Reviewed: I have personally reviewed following labs and imaging studies  CBC: Recent Labs  Lab 02/27/21 0157  WBC 4.4  NEUTROABS 1.1*  HGB 11.5*  HCT 33.6*  MCV 94.1  PLT 267    Basic Metabolic Panel: Recent Labs  Lab 02/23/21 0250 02/27/21 0157  NA 136 139  K 3.4* 3.7  CL 101 104  CO2 27 26  GLUCOSE 102* 97  BUN 23* 16  CREATININE 0.64 0.64  CALCIUM 9.5 9.7    GFR: Estimated Creatinine Clearance: 89.6 mL/min (by C-G formula based on SCr of 0.64 mg/dL).  Liver Function Tests: Recent Labs  Lab 02/23/21 0250 02/27/21 0157  AST 37 42*  ALT 39 64*  ALKPHOS 44 44  BILITOT 0.2* 0.4  PROT 7.4 6.8  ALBUMIN 4.2 3.5    CBG: No results for input(s): GLUCAP in the last 168 hours.    No results found for this or any previous visit (from the past 240 hour(s)).       Radiology Studies: No results found.      Scheduled Meds:  amLODipine  2.5 mg Oral Daily  aspirin  325 mg Oral Daily   Or   aspirin  300 mg Rectal Daily   busPIRone  10 mg Oral BID   clonazePAM  1 mg Oral TID   docusate sodium  100 mg Oral BID   feeding supplement  237 mL Oral BID BM   FLUoxetine  40 mg Oral Daily   folic acid  1 mg Oral Daily   gabapentin  600 mg Oral TID   levETIRAcetam  500 mg Oral BID   mouth rinse  15 mL Mouth Rinse BID   nicotine  21 mg Transdermal Daily   OLANZapine  5 mg Oral Daily   OLANZapine  7.5 mg Oral QHS   polyethylene glycol  17 g Oral Daily   rivaroxaban  10 mg Oral Daily   traZODone  100 mg Oral QHS   valproic acid  750 mg Oral BID   Continuous Infusions:  sodium chloride 10 mL/hr at 02/04/21 1457     LOS: 55 days       Huey Bienenstock, MD [facility]   To contact the attending provider between 7A-7P or the covering provider during after hours 7P-7A,  please log into the web site [email or web address] and access using universal [place] password for that web site. If you do not have the password, please call the hospital operator.  03/01/2021, 1:13 PM

## 2021-03-01 NOTE — Progress Notes (Signed)
Physical Therapy Treatment Patient Details Name: Steve [name] MRN: [id number] DOB: 04-Dec-1965 Today's Date: 03/01/2021   History of Present Illness Pt is a 57 y.o. male admitted from group home for convalescence, combativeness and altered sensorium on 01/05/21 with witnessed seizure, AMS. 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 EEG unremarkable. LP on 12/21 showed staph in CSF, felt to be contaminant. Workup for AKI, rhabdomyolysis. Course complicated by c/o L foot pain; imaging negative for acute injury. Pt remains hospitalized due to difficult to place. Pt with psych consult who felt no need for inpt psych. No PMH in chart.    PT Comments    Pt alert and interactive today. Pt with noted L LE discomfort when weight bearing in addition to poor co-ordination/ataxic like gait pattern requiring assist x2 for safe mobility. Pt continues with severe cognitive deficits and is unable to care for self at this time. Acute PT to cont to follow.    Recommendations for follow up therapy are one component of a multi-disciplinary discharge planning process, led by the attending physician.  Recommendations may be updated based on patient status, additional functional criteria and insurance authorization.  Follow Up Recommendations  Skilled nursing-short term rehab (<3 hours/day)     Assistance Recommended at Discharge Frequent or constant Supervision/Assistance  Patient can return home with the following A little help with walking and/or transfers;Assistance with cooking/housework;A little help with bathing/dressing/bathroom;Direct supervision/assist for financial management;Direct supervision/assist for medications management;Assist for transportation   Equipment Recommendations  Rolling walker (2 wheels)    Recommendations for Other Services Speech consult     Precautions /  Restrictions Precautions Precautions: Fall Precaution Comments: h/o L foot pain; in Posey belt in bed Restrictions Weight Bearing Restrictions: No LLE Weight Bearing: Weight bearing as tolerated Other Position/Activity Restrictions: CAM boot not needed per ortho note     Mobility  Bed Mobility Overal bed mobility: Needs Assistance Bed Mobility: Supine to Sit     Supine to sit: Min guard     General bed mobility comments: min guard for safety due to laying so close to edge and pts impulsivity    Transfers Overall transfer level: Needs assistance Equipment used: None Transfers: Sit to/from Stand Sit to Stand: Mod assist           General transfer comment: modA to power up, pt with ntoed antalgia with L LE WBing, pt given RW to help support    Ambulation/Gait Ambulation/Gait assistance: Mod assist Gait Distance (Feet): 140 Feet Assistive device: 2 person hand held assist Gait Pattern/deviations: Step-through pattern, Decreased stride length, Trunk flexed Gait velocity: decreased Gait velocity interpretation: <1.31 ft/sec, indicative of household ambulator   General Gait Details: pt unable to manage RW safely and kept bending down to "pick the weed down there". pt then transitioned to bilat HHA. pt with occasional L knee buckling due to L foot pain/antalgia. Pt requiring constant direction to stay on task and navigate back to room   Stairs             Wheelchair Mobility    Modified Rankin (Stroke Patients Only)       Balance Overall balance assessment: Needs assistance Sitting-balance support: Feet supported Sitting balance-Leahy Scale: Good Sitting balance - Comments: Impulsively leaning and repositioning multiple times   Standing balance support: Single extremity supported, During functional activity, Bilateral upper extremity supported Standing balance-Leahy Scale: Poor Standing balance  comment: UE support and at times mod assist for dynamic                             Cognition Arousal/Alertness: Awake/alert Behavior During Therapy: Impulsive Overall Cognitive Status: Impaired/Different from baseline Area of Impairment: Orientation, Attention, Memory, Following commands, Safety/judgement, Awareness, Problem solving                 Orientation Level: Time, Situation (once PT said hospital pt recalled hospital) Current Attention Level: Sustained Memory: Decreased short-term memory Following Commands: Follows one step commands with increased time Safety/Judgement: Decreased awareness of safety, Decreased awareness of deficits (pt urinated all over bed and floor, not aware) Awareness: Intellectual Problem Solving: Requires verbal cues, Requires tactile cues, Slow processing General Comments: Pt requires constant re-direction. Pt with zero insight to deficits and zero safety awareness. Pt with no recall of situation.        Exercises      General Comments General comments (skin integrity, edema, etc.): VSS. pt was able to don pants and socks while in bed      Pertinent Vitals/Pain Pain Assessment Pain Assessment: No/denies pain (however pt with noted limp on L foot) Pain Location: L foot with ambulation, limping Pain Descriptors / Indicators: Grimacing, Guarding    Home Living                          Prior Function            PT Goals (current goals can now be found in the care plan section) Acute Rehab PT Goals Patient Stated Goal: None stated PT Goal Formulation: Patient unable to participate in goal setting Time For Goal Achievement: 03/05/21 Potential to Achieve Goals: Fair Progress towards PT goals: Progressing toward goals    Frequency    Min 2X/week      PT Plan Current plan remains appropriate    Co-evaluation              AM-PAC PT "6 Clicks" Mobility   Outcome Measure  Help needed turning from your back to your side while in a flat bed without using bedrails?:  None Help needed moving from lying on your back to sitting on the side of a flat bed without using bedrails?: A Little Help needed moving to and from a bed to a chair (including a wheelchair)?: A Lot Help needed standing up from a chair using your arms (e.g., wheelchair or bedside chair)?: A Lot Help needed to walk in hospital room?: A Lot Help needed climbing 3-5 steps with a railing? : Total 6 Click Score: 14    End of Session Equipment Utilized During Treatment: Gait belt Activity Tolerance: Other (comment) (Limited by poor cognition) Patient left: with call bell/phone within reach;with restraints reapplied;in chair;with chair alarm set (posey belt around chair, chair alarm bad, tele sitter, and bilat mittens) Nurse Communication: Mobility status PT Visit Diagnosis: Other abnormalities of gait and mobility (R26.89);Other symptoms and signs involving the nervous system (R29.898) Pain - Right/Left: Left Pain - part of body: Ankle and joints of foot     Time: 1105-1131 PT Time Calculation (min) (ACUTE ONLY): 26 min  Charges:  $Gait Training: 8-22 mins $Therapeutic Activity: 8-22 mins                     Lewis Shock, PT, DPT Acute Rehabilitation Services Pager #: [phone] Office #:  [phone]    Ciria Bernardini M Arshawn Valdez 03/01/2021, 1:02 PM

## 2021-03-02 NOTE — Progress Notes (Signed)
Progress Note   Patient: Steve [name] TCY:[id number] DOB: 12-04-1965 DOA: 01/05/2021     56 DOS: the patient was seen and examined on 03/02/2021   Brief hospital course: Klark Vanderhoef is a 56 y.o. male with history of major depression, anxiety who was in a residential ETOH treatment center for last 5 months. He was brought to the ED on 12/20 from a group home for convalescence, combativeness and altered sensorium.  Per collateral history, patient was not acting typical for last few days.  In the ED, patient was confused, combative. Labs showed AKI with creatinine of 3.27, rhabdomyolysis, abnormal LFTs, elevated WBC count at 23.7 and lactic acid level at 7.6. 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 Blood culture was sent, empiric antibiotic started.  IV fluid resuscitation started and patient was initially admitted to ICU. 12/21, underwent LP which showed staph in CSF, felt to be contaminant.  EEG did not show any seizures 12/24, HD catheter was placed but patient did not require HD as creatinine started to improve 12/31, with clinical improvement, patient was transferred out of ICU to [facility]. 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 Also evidence of prior lacunar infarct right basal ganglia Cognitive screening revealed significant cognitive deficits Continue Prozac, BuSpar, trazodone-has known history of major depressive disorder-continue Zyprexa and Depakote for behavioral issues and cognitive impairment-2/1 will increase Zyprexa doses today Follow QTC,  platelets and LFTs periodically while on Zyprexa and Depakote 2/2 LFTs are normal, platelets are normal; QTC 469 ms on 2/1   -Significant improvement in behaviors and impulsivity with the addition of Klonopin.  Posey enclosure bed has been discontinued -Continue Klonopin  TID -Remains impulsive so Posey belt restraint in place.   -Zyprexa dose increased on 2/10 but patient developed oversedation so dose is decreased.  Tolerating increase in Depakote dose although does have new transaminitis which is mild and likely related to both Zyprexa and Depakote.    Witnessed seizure ([name]) No apparent prior history of seizure disorder noting patient was not on AEDs prior to admission Unclear if withdrawal seizure or related to other substances EEG unremarkable.   Continue Keppra at recommendation of neurology  Acute kidney injury with rhabdomyolysis Resolved Creatinine peaked at 7.48 on 12/26.  Nephrology consulted with initial plans to initiate dialysis including placement of [facility] but renal function gradually improved and dialysis not needed and TDC removed  Possible DVT (deep venous thrombosis) ([name]) Per critical care note, hemodialysis cath had a clot on it when it was removed.  UE duplex  12/31: no DVT.    Aspiration pneumonia ([name]) Completed a course of Zosyn  Elevated transaminases at time of presentation Likely secondary to sepsis like physiology at presentation His AST and ALT were significantly elevated to over 700s on admission.  Re emergence mild elevation LFTs-likely due to Zyprexa and Depakote Due to oversedation Zyprexa dose decreased Follow LFTs prn   Right ventricular dysfunction- (present on admission) Echo 1/2 with EF 60 to 65%.  Cannot rule out a small PFO this finding determined to be clinically insignificant given the patient was asymptomatic  Physical deconditioning- (present  on admission) Influenced by patient's poor cognition as well as prior heavy alcohol abuse and  malnutrition before admission Therapy continues to recommend SNF placement due to level of assist required with ADLs and IADLs as well as ongoing poor cognition SLP working with patient on 2/9 and have also noted that patient's ability to stay focused and work with therapy has greatly improved over the past several days  Severe sepsis without septic shock ([name]) Ruled out Sepsis-like physiology secondary to presentation with profound dehydration, hypoperfusion from dehydration related hypotension seizure activity Chest work-up was negative        Subjective:  Alert.  Sitting up in bed feeding self breakfast.  Note blanket is balled up and sitting between patient and breakfast tray as well.  Physical Exam: Vitals:   03/01/21 0735 03/02/21 0000 03/02/21 0403 03/02/21 0755  BP: 105/81 (!) 122/98 100/74 109/83  Pulse: 78 89 76 78  Resp: 16 16 16 18   Temp: 97.6 F (36.4 C) 97.8 F (36.6 C)  98 F (36.7 C)  TempSrc: Oral Oral    SpO2:  98% 100% 99%  Weight:      Height:       Constitutional: NAD, calm Respiratory: Anterior lung sounds clear, Normal respiratory effort. RA Cardiovascular: Regular pulse, no peripheral edema.  Normotensive.  S1-S2 Abdomen: no tenderness, no masses palpated. Bowel sounds positive. LBM 2/12 Neurologic: CN 2-12 grossly intact. Sensation intact, Strength 3+-4/5 x all 4 extremities.  Psychiatric: Alert and oriented times name only.  Very confused today and having some visual hallucinations.  Requiring belt restraint to prevent self injury.  Is very fluent in speech even when he is having word finding difficulty and is attempting to carry on appropriate conversations.    Data Reviewed: There are no new results to review at this time.  Family Communication:  Patient only  Disposition: Status is: Inpatient Remains inpatient appropriate because: Safe discharge plan secondary to severe cognitive impairment requiring SNF placement.  Currently lacks  funding.     A physical therapy consult is indicated based on the patients mobility assessment.   Mobility Assessment (last 72 hours)     Mobility Assessment     Row Name 03/01/21 1200 02/28/21 2110 02/27/21 2040   Does patient have an order for bedrest or is patient medically unstable -- No - Continue assessment No - Continue assessment   What is the highest level of mobility based on the progressive mobility assessment? Level 5 (Walks with assist in room/hall) - Balance while stepping forward/back and can walk in room with assist - Complete Level 3 (Stands with assist) - Balance while standing  and cannot march in place Level 5 (Walks with assist in room/hall) - Balance while stepping forward/back and can walk in room with assist - Complete   Is the above level different from baseline mobility prior to current illness? -- Yes - Recommend PT order Yes - Recommend PT order               DVT Prophylaxis  .Rivaroxaban (xarelto) tablet 10 mg , Rivaroxaban (xarelto) tablet 10 mg   Planned Discharge Destination:  Skilled nursing facility  Medically stable: No-still titrating behavioral meds  COVID vaccination status:  Unknown  Consultants: Nephrology Psychiatry    Procedures: EEG Echocardiogram TDC   Antibiotics: Unasyn x1 dose, cefepime x1 dose, ceftriaxone x1 dose, vancomycin x2 doses     Time spent: 15 minutes  Author: [id number], NP 03/02/2021 10:35 AM  For on call  review [email or web address].

## 2021-03-02 NOTE — Plan of Care (Signed)
Pt is alert oriented x 2-3, pt has abdominal restraint, pt has been changed twice, bed soiled each time. Telesitter present. Pt took PO medications with out complication. Rest promoted. Education provided regarding safety.   Problem: Education: Goal: Knowledge of General Education information will improve Description: Including pain rating scale, medication(s)/side effects and non-pharmacologic comfort measures Outcome: Progressing   Problem: Health Behavior/Discharge Planning: Goal: Ability to manage health-related needs will improve Outcome: Progressing   Problem: Clinical Measurements: Goal: Ability to maintain clinical measurements within normal limits will improve Outcome: Progressing Goal: Will remain free from infection Outcome: Progressing Goal: Diagnostic test results will improve Outcome: Progressing Goal: Respiratory complications will improve Outcome: Progressing Goal: Cardiovascular complication will be avoided Outcome: Progressing   Problem: Activity: Goal: Risk for activity intolerance will decrease Outcome: Progressing   Problem: Nutrition: Goal: Adequate nutrition will be maintained Outcome: Progressing   Problem: Coping: Goal: Level of anxiety will decrease Outcome: Progressing   Problem: Elimination: Goal: Will not experience complications related to bowel motility Outcome: Progressing Goal: Will not experience complications related to urinary retention Outcome: Progressing   Problem: Pain Managment: Goal: General experience of comfort will improve Outcome: Progressing   Problem: Safety: Goal: Ability to remain free from injury will improve Outcome: Progressing   Problem: Skin Integrity: Goal: Risk for impaired skin integrity will decrease Outcome: Progressing   Problem: Education: Goal: Expressions of having a comfortable level of knowledge regarding the disease process will increase Outcome: Progressing   Problem: Coping: Goal: Ability to  adjust to condition or change in health will improve Outcome: Progressing Goal: Ability to identify appropriate support needs will improve Outcome: Progressing   Problem: Health Behavior/Discharge Planning: Goal: Compliance with prescribed medication regimen will improve Outcome: Progressing   Problem: Medication: Goal: Risk for medication side effects will decrease Outcome: Progressing   Problem: Clinical Measurements: Goal: Complications related to the disease process, condition or treatment will be avoided or minimized Outcome: Progressing Goal: Diagnostic test results will improve Outcome: Progressing   Problem: Safety: Goal: Verbalization of understanding the information provided will improve Outcome: Progressing   Problem: Self-Concept: Goal: Level of anxiety will decrease Outcome: Progressing Goal: Ability to verbalize feelings about condition will improve Outcome: Progressing

## 2021-03-03 NOTE — Progress Notes (Signed)
Occupational Therapy Treatment Patient Details Name: Steve [name] MRN: [id number] DOB: 1965-10-06 Today's Date: 03/03/2021   History of present illness Pt is a 56 y.o. male admitted from group home for convalescence, combativeness and altered sensorium on 01/05/21 with witnessed seizure, AMS. 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 EEG unremarkable. LP on 12/21 showed staph in CSF, felt to be contaminant. Workup for AKI, rhabdomyolysis. Course complicated by c/o L foot pain; imaging negative for acute injury. Pt remains hospitalized due to difficult to place. Pt with psych consult who felt no need for inpt psych. No PMH in chart.   OT comments  Pt making incremental progress with physical OT goals. Pt continues to be limited by cognition, requiring max verbal cuing and redirection throughout session. Pt tolerating being on his feet and participating in a task for 15 mins this session. OT will continue to follow acutely.    Recommendations for follow up therapy are one component of a multi-disciplinary discharge planning process, led by the attending physician.  Recommendations may be updated based on patient status, additional functional criteria and insurance authorization.    Follow Up Recommendations  Skilled nursing-short term rehab (<3 hours/day)    Assistance Recommended at Discharge Frequent or constant Supervision/Assistance  Patient can return home with the following  A little help with walking and/or transfers;A little help with bathing/dressing/bathroom;Assistance with cooking/housework;Assist for transportation;Help with stairs or ramp for entrance   Equipment Recommendations  None recommended by OT    Recommendations for Other Services      Precautions / Restrictions Precautions Precautions: Fall Precaution Comments: h/o L foot  pain Restrictions Weight Bearing Restrictions: No       Mobility Bed Mobility Overal bed mobility: Modified Independent             General bed mobility comments: No assist needed    Transfers Overall transfer level: Needs assistance Equipment used: None Transfers: Sit to/from Stand Sit to Stand: Min assist           General transfer comment: Min A for safety due to R lean     Balance Overall balance assessment: Needs assistance Sitting-balance support: Feet supported Sitting balance-Leahy Scale: Good     Standing balance support: Single extremity supported, During functional activity, Bilateral upper extremity supported Standing balance-Leahy Scale: Poor Standing balance comment: UE support and at times min assist for dynamic                           ADL either performed or assessed with clinical judgement   ADL Overall ADL's : Needs assistance/impaired                     Lower Body Dressing: Min guard;Sitting/lateral leans Lower Body Dressing Details (indicate cue type and reason): Donned socks and pants in bed Toilet Transfer: Minimal assistance;Ambulation Toilet Transfer Details (indicate cue type and reason): Simulated with long walk, requiring assist with attention and safety         Functional mobility during ADLs: Minimal assistance;Rolling walker (2 wheels) General ADL Comments: Pt requiring assist for safety due to impulsivity and running into objects on his R side, despite cuing    Extremity/Trunk Assessment              Vision       Perception     Praxis  Cognition Arousal/Alertness: Awake/alert Behavior During Therapy: Impulsive Overall Cognitive Status: Impaired/Different from baseline                                 General Comments: Pt requires constant re-direction. Pt with zero insight to deficits and zero safety awareness. Pt with no recall of situation.        Exercises       Shoulder Instructions       General Comments VSS on RA    Pertinent Vitals/ Pain       Pain Assessment Pain Assessment: Faces Faces Pain Scale: Hurts a little bit Pain Location: left ankle Pain Descriptors / Indicators: Grimacing, Guarding Pain Intervention(s): Monitored during session  Home Living                                          Prior Functioning/Environment              Frequency  Min 1X/week        Progress Toward Goals  OT Goals(current goals can now be found in the care plan section)  Progress towards OT goals: Progressing toward goals  Acute Rehab OT Goals Patient Stated Goal: None stated OT Goal Formulation: With patient Time For Goal Achievement: 03/12/21 Potential to Achieve Goals: Fair ADL Goals Pt Will Perform Grooming: with modified independence;standing Pt Will Perform Lower Body Bathing: with modified independence;sit to/from stand Pt Will Perform Lower Body Dressing: with modified independence;sit to/from stand Pt Will Transfer to Toilet: with modified independence;ambulating Pt Will Perform Toileting - Clothing Manipulation and hygiene: with modified independence;sit to/from stand Additional ADL Goal #1: Pt will demonstrate decreased pain to LEs by tolerating light touch as needed for LE ADLs in order to increase independence with and tolerance for self care.  Plan Discharge plan remains appropriate    Co-evaluation                 AM-PAC OT "6 Clicks" Daily Activity     Outcome Measure   Help from another person eating meals?: None Help from another person taking care of personal grooming?: A Little Help from another person toileting, which includes using toliet, bedpan, or urinal?: A Lot Help from another person bathing (including washing, rinsing, drying)?: A Lot Help from another person to put on and taking off regular upper body clothing?: A Little Help from another person to put on and taking off  regular lower body clothing?: A Little 6 Click Score: 17    End of Session Equipment Utilized During Treatment: Gait belt;Rolling walker (2 wheels)  OT Visit Diagnosis: Unsteadiness on feet (R26.81);Muscle weakness (generalized) (M62.81);Dizziness and giddiness (R42) Pain - Right/Left: Left Pain - part of body: Leg;Ankle and joints of foot   Activity Tolerance Patient tolerated treatment well   Patient Left in bed;with call bell/phone within reach;with bed alarm set   Nurse Communication Mobility status        Time: 6568-1275 OT Time Calculation (min): 18 min  Charges: OT General Charges $OT Visit: 1 Visit OT Treatments $Therapeutic Activity: 8-22 mins  Vipul Cafarelli H., OTR/L Acute Rehabilitation  Creedence Heiss Elane Fredericka Bottcher 03/03/2021, 6:08 PM

## 2021-03-03 NOTE — Progress Notes (Signed)
CSW spoke with Joaine of [employer] to discuss patient's current cognitive status. Rodena Goldmann will speak with patient to obtain information regarding his finances.  Edwin Dada, MSW, LCSW Transitions of Care   Clinical Social Worker II [phone]

## 2021-03-03 NOTE — Progress Notes (Signed)
Progress Note   Patient: Steve [name] [id number] DOB: 09-05-1965 DOA: 01/05/2021     57 DOS: the patient was seen and examined on 03/03/2021   Brief hospital course: Tajae Rybicki is a 56 y.o. male with history of major depression, anxiety who was in a residential ETOH treatment center for last 5 months. He was brought to the ED on 12/20 from a group home for convalescence, combativeness and altered sensorium.  Per collateral history, patient was not acting typical for last few days.  In the ED, patient was confused, combative. Labs showed AKI with creatinine of 3.27, rhabdomyolysis, abnormal LFTs, elevated WBC count at 23.7 and lactic acid level at 7.6. 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 Blood culture was sent, empiric antibiotic started.  IV fluid resuscitation started and patient was initially admitted to ICU. 12/21, underwent LP which showed staph in CSF, felt to be contaminant.  EEG did not show any seizures 12/24, HD catheter was placed but patient did not require HD as creatinine started to improve 12/31, with clinical improvement, patient was transferred out of ICU to [facility]. 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 Also evidence of prior lacunar infarct right basal ganglia Cognitive screening revealed significant cognitive deficits Continue Prozac, BuSpar, trazodone-has known history of major depressive disorder-continue Zyprexa and Depakote for behavioral issues and cognitive impairment-2/1 will increase Zyprexa doses today Follow QTC,  platelets and LFTs periodically while on Zyprexa and Depakote 2/2 LFTs are normal, platelets are normal; QTC 469 ms on 2/1   -Significant improvement in behaviors and impulsivity with the addition of Klonopin.  Posey enclosure bed has been discontinued -Continue Klonopin  TID -Remains impulsive so Posey belt restraint in place. 2/15 attempted to remove belt but within 90 seconds patient was trying to get out of bed unassisted despite instruction in how to use call light and call light placed within reach.  Belt reapplied by staff. -Zyprexa dose increased on 2/10 but patient developed oversedation so dose is decreased.  Tolerating increase in Depakote dose although does have new transaminitis which is mild and likely related to both Zyprexa and Depakote.    Witnessed seizure ([name]) No apparent prior history of seizure disorder noting patient was not on AEDs prior to admission Unclear if withdrawal seizure or related to other substances EEG unremarkable.   Continue Keppra at recommendation of neurology  Acute kidney injury with rhabdomyolysis Resolved Creatinine peaked at 7.48 on 12/26.  Nephrology consulted with initial plans to initiate dialysis including placement of [facility] but renal function gradually improved and dialysis not needed and TDC removed  Possible DVT (deep venous thrombosis) ([name]) Per critical care note, hemodialysis cath had a clot on it when it was removed.  UE duplex  12/31: no DVT.    Aspiration pneumonia ([name]) Completed a course of Zosyn  Elevated transaminases at time of presentation Likely secondary to sepsis like physiology at presentation His AST and ALT were significantly elevated to over 700s on admission.  Re emergence mild elevation LFTs-likely due to Zyprexa and Depakote Due to oversedation Zyprexa dose decreased Repeat LFTs and Depakote level on  2/16   Right ventricular dysfunction- (present on admission) Echo 1/2 with EF 60 to 65%.  Cannot rule out a small  PFO this finding determined to be clinically insignificant given the patient was asymptomatic  Physical deconditioning- (present on admission) Influenced by patient's poor cognition as well as prior heavy alcohol abuse and malnutrition before admission Therapy continues to recommend SNF placement due to level of assist required with ADLs and IADLs as well as ongoing poor cognition SLP working with patient on 2/9 and have also noted that patient's ability to stay focused and work with therapy has greatly improved over the past several days  Severe sepsis without septic shock ([name]) Ruled out Sepsis-like physiology secondary to presentation with profound dehydration, hypoperfusion from dehydration related hypotension seizure activity Chest work-up was negative        Subjective:    Physical Exam: Vitals:   03/02/21 2055 03/03/21 0002 03/03/21 0459 03/03/21 0725  BP: 120/72 (!) 82/67 114/87 116/89  Pulse: 80 80 89 81  Resp: 18 18 20 18   Temp: 98 F (36.7 C)  97.7 F (36.5 C) 97.7 F (36.5 C)  TempSrc: Oral  Oral Oral  SpO2: 100% 94% 100% 99%  Weight:      Height:       Constitutional: NAD, calm but subtly restless Respiratory: Anterior lung sounds clear, Normal respiratory effort. RA Cardiovascular: Regular pulse, no peripheral edema.  Normotensive.  S1-S2 Abdomen: no tenderness, no masses palpated. Bowel sounds positive. LBM 2/12 Neurologic: CN 2-12 grossly intact. Sensation intact, Strength 3+-4/5 x all 4 extremities.  Psychiatric: Alert and oriented times name only.  Very confused today and having some visual hallucinations.  Requiring belt restraint to prevent self injury.  RN briefly removed safety belt today but within 90 seconds patient was attempting to get out of bed unassisted so safety belt replace  Data Reviewed: There are no new results to review at this time.  Family Communication:  Patient only  Disposition: Status is: Inpatient Remains inpatient  appropriate because: Safe discharge plan secondary to severe cognitive impairment requiring SNF placement.  Currently lacks funding.     A physical therapy consult is indicated based on the patients mobility assessment.   Mobility Assessment (last 72 hours)     Mobility Assessment     Row Name 03/03/21 0900 03/01/21 1200 02/28/21 2110   Does patient have an order for bedrest or is patient medically unstable No - Continue assessment -- No - Continue assessment   What is the highest level of mobility based on the progressive mobility assessment? Level 5 (Walks with assist in room/hall) - Balance while stepping forward/back and can walk in room with assist - Complete Level 5 (Walks with assist in room/hall) - Balance while stepping forward/back and can walk in room with assist - Complete Level 3 (Stands with assist) - Balance while standing  and cannot march in place   Is the above level different from baseline mobility prior to current illness? Yes - Recommend PT order -- Yes - Recommend PT order               DVT Prophylaxis  .Rivaroxaban (xarelto) tablet 10 mg , Rivaroxaban (xarelto) tablet 10 mg   Planned Discharge Destination:  Skilled nursing facility  Medically stable: No-still titrating behavioral meds  COVID vaccination status:  Unknown  Consultants: Nephrology Psychiatry    Procedures: EEG Echocardiogram TDC   Antibiotics: Unasyn x1 dose, cefepime x1 dose, ceftriaxone x1 dose, vancomycin x2 doses  Time spent: 15 minutes  Author: Junious Silk, NP 03/03/2021 11:32 AM  For on call review [email or web address].

## 2021-03-03 NOTE — Progress Notes (Signed)
Speech Language Pathology Treatment: Cognitive-Linquistic  Patient Details Name: Steve [name] MRN: [id number] DOB: October 03, 1965 Today's Date: 03/03/2021 Time: [id number] SLP Time Calculation (min) (ACUTE ONLY): 18 min  Assessment / Plan / Recommendation Clinical Impression  Pt was seen for cognitive-linguistic treatment. He was alert and cooperative during the session. He was watching the news upon SLP's entry and he was able to accurately recall one topic that was mentioned. Pt expressed during the session that he did not believe that he has a speech impairment, but endorsed feeling confused since being admitted. Pt was better able to demonstrate focused attention during this session, but continued to be distracted by apparent visual hallucinations such as "the other woman in the room". He was able to verbalize and demonstrate the necessary steps to take for requesting help and independently used the remove to turn on the TV and to change the channel. Pt exhibited 40% accuracy with immediate recall of three related items increasing 80% with cues. He completed a simple reasoning task with 100% accuracy. It is noteworthy that pt was unable to focus his attention adequately for this task and he demonstrated 0% last week. He accurately completed a single time management problem, but additional problems and tasks were deferred due to his progressively waning attention. SLP will continue to follow pt.     HPI HPI: Pt is a 56 y.o. male admitted from group home (substance abuse rehab?) on 01/05/21 with witnessed seizure, AMS. 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 EEG unremarkable. LP on 12/21 showed staph in CSF, felt to be contaminant. Workup for AKI, rhabdomyolysis. Dx Uremic encephalopathy. Course complicated by c/o L foot pain; imaging negative for acute injury. Pt remains hospitalized  due to difficulty with placement. SLP evaluation on 01/26/21: 9/27 on [place] Exam, SLUMS 02/03/21: 2/30; SLUMS 1/27: 1/30. Psych consulted and as of 1/16, pt did not meet criteria for inpatient psychiatric admission.      SLP Plan  Continue with current plan of care      Recommendations for follow up therapy are one component of a multi-disciplinary discharge planning process, led by the attending physician.  Recommendations may be updated based on patient status, additional functional criteria and insurance authorization.    Recommendations                   Oral Care Recommendations: Oral care BID Follow Up Recommendations: Skilled nursing-short term rehab (<3 hours/day) Assistance recommended at discharge: Frequent or constant Supervision/Assistance SLP Visit Diagnosis: Cognitive communication deficit (R41.841);Attention and concentration deficit Plan: Continue with current plan of care         Steve [name]. Steve [name], [place], [place] Office number [phone] Pager [place]  03/03/2021, 10:02 AM

## 2021-03-03 NOTE — Plan of Care (Signed)
Pt is alert x 3, pt c/o pain to left ankle , prn pain medication given . Abdominal restraint in place. Pt noted attempting to get out of bed. Pt also has telesitter. No distress noted.     Problem: Education: Goal: Knowledge of General Education information will improve Description: Including pain rating scale, medication(s)/side effects and non-pharmacologic comfort measures Outcome: Progressing   Problem: Health Behavior/Discharge Planning: Goal: Ability to manage health-related needs will improve Outcome: Progressing   Problem: Clinical Measurements: Goal: Ability to maintain clinical measurements within normal limits will improve Outcome: Progressing Goal: Will remain free from infection Outcome: Progressing Goal: Diagnostic test results will improve Outcome: Progressing Goal: Respiratory complications will improve Outcome: Progressing Goal: Cardiovascular complication will be avoided Outcome: Progressing   Problem: Nutrition: Goal: Adequate nutrition will be maintained Outcome: Progressing   Problem: Coping: Goal: Level of anxiety will decrease Outcome: Progressing   Problem: Elimination: Goal: Will not experience complications related to bowel motility Outcome: Progressing Goal: Will not experience complications related to urinary retention Outcome: Progressing   Problem: Pain Managment: Goal: General experience of comfort will improve Outcome: Progressing   Problem: Safety: Goal: Ability to remain free from injury will improve Outcome: Progressing   Problem: Skin Integrity: Goal: Risk for impaired skin integrity will decrease Outcome: Progressing   Problem: Education: Goal: Expressions of having a comfortable level of knowledge regarding the disease process will increase Outcome: Progressing   Problem: Coping: Goal: Ability to adjust to condition or change in health will improve Outcome: Progressing Goal: Ability to identify appropriate support needs  will improve Outcome: Progressing   Problem: Health Behavior/Discharge Planning: Goal: Compliance with prescribed medication regimen will improve Outcome: Progressing   Problem: Medication: Goal: Risk for medication side effects will decrease Outcome: Progressing   Problem: Clinical Measurements: Goal: Complications related to the disease process, condition or treatment will be avoided or minimized Outcome: Progressing Goal: Diagnostic test results will improve Outcome: Progressing   Problem: Safety: Goal: Verbalization of understanding the information provided will improve Outcome: Progressing   Problem: Self-Concept: Goal: Level of anxiety will decrease Outcome: Progressing Goal: Ability to verbalize feelings about condition will improve Outcome: Progressing

## 2021-03-04 LAB — COMPREHENSIVE METABOLIC PANEL WITH GFR
ALT: 41 U/L (ref 0–44)
AST: 29 U/L (ref 15–41)
Albumin: 3.8 g/dL (ref 3.5–5.0)
Alkaline Phosphatase: 40 U/L (ref 38–126)
Anion gap: 8 (ref 5–15)
BUN: 20 mg/dL (ref 6–20)
CO2: 29 mmol/L (ref 22–32)
Calcium: 9.6 mg/dL (ref 8.9–10.3)
Chloride: 101 mmol/L (ref 98–111)
Creatinine, Ser: 0.68 mg/dL (ref 0.61–1.24)
GFR, Estimated: >60 mL/min
Glucose, Bld: 91 mg/dL (ref 70–99)
Potassium: 3.6 mmol/L (ref 3.5–5.1)
Sodium: 138 mmol/L (ref 135–145)
Total Bilirubin: 0.4 mg/dL (ref 0.3–1.2)
Total Protein: 7.2 g/dL (ref 6.5–8.1)

## 2021-03-04 LAB — VALPROIC ACID LEVEL: Valproic Acid Lvl: 55 ug/mL (ref 50.0–100.0)

## 2021-03-04 NOTE — Progress Notes (Signed)
Progress Note   Patient: Steve [name] EQA:[id number] DOB: [birth year] DOA: 01/05/2021     58 DOS: the patient was seen and examined on 03/04/2021   Brief hospital course: Steve [name] is a 56 y.o. male with history of major depression, anxiety who was in a residential ETOH treatment center for last 5 months. He was brought to the ED on 12/20 from a group home for convalescence, combativeness and altered sensorium.  Per collateral history, patient was not acting typical for last few days.  In the ED, patient was confused, combative. Labs showed AKI with creatinine of 3.27, rhabdomyolysis, abnormal LFTs, elevated WBC count at 23.7 and lactic acid level at 7.6. 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 Blood culture was sent, empiric antibiotic started.  IV fluid resuscitation started and patient was initially admitted to ICU. 12/21, underwent LP which showed staph in CSF, felt to be contaminant.  EEG did not show any seizures 12/24, HD catheter was placed but patient did not require HD as creatinine started to improve 12/31, with clinical improvement, patient was transferred out of ICU to [facility]. 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 Also evidence of prior lacunar infarct right basal ganglia Cognitive screening revealed significant cognitive deficits Continue Prozac, BuSpar, trazodone-has known history of major depressive disorder-continue Zyprexa and Depakote for behavioral issues and cognitive impairment-2/1 will increase Zyprexa doses today Follow QTC,  platelets and LFTs periodically while on Zyprexa and Depakote 2/2 LFTs are normal, platelets are normal; QTC 469 ms on 2/1   -Significant improvement in behaviors and impulsivity with the addition of Klonopin.  Posey enclosure bed has been discontinued -Continue Klonopin  TID -Remains impulsive so Posey belt restraint in place. 2/15 attempted to remove belt but within 90 seconds patient was trying to get out of bed unassisted despite instruction in how to use call light and call light placed within reach.  Belt reapplied by staff. -Zyprexa dose increased on 2/10 but had to be decreased due to oversedation.  Also with higher dose in combination with Depakote patient developed mild transaminitis which has resolved with decrease in Zyprexa dose.    Witnessed seizure ([name]) No apparent prior history of seizure disorder noting patient was not on AEDs prior to admission Unclear if withdrawal seizure or related to other substances EEG unremarkable.   Continue Keppra at recommendation of neurology  Acute kidney injury with rhabdomyolysis Resolved Creatinine peaked at 7.48 on 12/26.  Nephrology consulted with initial plans to initiate dialysis including placement of [facility] but renal function gradually improved and dialysis not needed and TDC removed  Possible DVT (deep venous thrombosis) ([name]) Per critical care note, hemodialysis cath had a clot on it when it was removed.  UE duplex  12/31: no DVT.    Aspiration pneumonia ([name])-resolved as of 03/04/2021 Completed a course of Zosyn  Elevated transaminases at time of presentation-resolved as of 03/04/2021 Likely secondary to sepsis like physiology at presentation His AST and ALT were significantly elevated to over 700s on admission.  Re emergence mild elevation LFTs-likely due to Zyprexa and Depakote Due to oversedation Zyprexa dose decreased LFTs  have normalized with decrease in Zyprexa dose. 2/16 valproic acid level 55   Right ventricular  dysfunction- (present on admission) Echo 1/2 with EF 60 to 65%.  Cannot rule out a small PFO this finding determined to be clinically insignificant given the patient was asymptomatic  Physical deconditioning- (present on admission) Influenced by patient's poor cognition as well as prior heavy alcohol abuse and malnutrition before admission Therapy continues to recommend SNF placement due to level of assist required with ADLs and IADLs as well as ongoing poor cognition SLP working with patient on 2/9 and have also noted that patient's ability to stay focused and work with therapy has greatly improved over the past several days  Severe sepsis without septic shock ([name]) Ruled out Sepsis-like physiology secondary to presentation with profound dehydration, hypoperfusion from dehydration related hypotension seizure activity Chest work-up was negative        Subjective:  Sitting up in chair eating breakfast.  Still requires belt restraint due to impulsivity.  Physical Exam: Vitals:   03/03/21 1934 03/03/21 2325 03/04/21 0359 03/04/21 0745  BP: 113/81 115/77 128/75 115/86  Pulse: 82 77 74 74  Resp: 16 18 14 18   Temp: (!) 97.5 F (36.4 C) 98.9 F (37.2 C) (!) 97.5 F (36.4 C) 98 F (36.7 C)  TempSrc: Axillary  Oral   SpO2: 100% 100% 100% 100%  Weight:      Height:       Constitutional: NAD, calm  Respiratory: Anterior lung sounds clear, Normal respiratory effort. RA Cardiovascular: Regular pulse, no peripheral edema.  Normotensive.  S1-S2 Abdomen: no tenderness, no masses palpated. Bowel sounds positive. LBM 2/15 Neurologic: CN 2-12 grossly intact. Sensation intact, Strength 3+-4/5 x all 4 extremities.  Psychiatric: Alert and oriented times name only.  Still require safety belt for impulsivity  Data Reviewed: There are no new results to review at this time.  Family Communication:  Patient only  Disposition: Status is: Inpatient Remains inpatient appropriate because:   Unsafe discharge plan secondary to severe cognitive impairment requiring SNF placement.  Currently lacks funding.     A physical therapy consult is indicated based on the patients mobility assessment.   Mobility Assessment (last 72 hours)     Mobility Assessment     Row Name 03/03/21 1700 03/03/21 0900 03/01/21 1200   Does patient have an order for bedrest or is patient medically unstable -- No - Continue assessment --   What is the highest level of mobility based on the progressive mobility assessment? Level 5 (Walks with assist in room/hall) - Balance while stepping forward/back and can walk in room with assist - Complete Level 5 (Walks with assist in room/hall) - Balance while stepping forward/back and can walk in room with assist - Complete Level 5 (Walks with assist in room/hall) - Balance while stepping forward/back and can walk in room with assist - Complete   Is the above level different from baseline mobility prior to current illness? -- Yes - Recommend PT order --               DVT Prophylaxis  .Rivaroxaban (xarelto) tablet 10 mg , Rivaroxaban (xarelto) tablet 10 mg   Planned Discharge Destination:  Skilled nursing facility  Medically stable: No-still titrating behavioral meds  COVID vaccination status:  Unknown  Consultants: Nephrology Psychiatry    Procedures: EEG Echocardiogram TDC   Antibiotics: Unasyn x1 dose, cefepime x1 dose, ceftriaxone x1 dose, vancomycin x2 doses     Time spent: 15 minutes  Author: Junious Silk, NP 03/04/2021 10:51 AM  For on call review [email or web address].

## 2021-03-04 NOTE — Progress Notes (Signed)
Speech Language Pathology Treatment: Cognitive-Linquistic  Patient Details Name: Steve [name] MRN: [id number] DOB: [birth year] Today's Date: 03/04/2021 Time: 1103-1594 SLP Time Calculation (min) (ACUTE ONLY): 14 min  Assessment / Plan / Recommendation Clinical Impression  Pt was seen for cognitive-linguistic treatment. He was alert and cooperative during the session. He was accurately able to recall some activities from the day, but confabulation was noted when pt attempted to provide details. He exhibited increased difficulty with attention during this session. This could have been partly due to the presence of increased distractions including EVS staff who cleaned the room during the session and his frequent attempts to drink. However, his inattention was still more pronounced when these distractions were eliminated. Pt was inconsistently able to recall individual items, but demonstrated notable difficulty beyond this point with 0% accuracy for immediate recall of 2 or three items. His overall performance was worse than on 2/15. Pt was seen after he ambulated with PT and the impact of fatigue on his performance is considered. SLP will continue to follow pt.    HPI HPI: Pt is a 56 y.o. male admitted from group home (substance abuse rehab?) on 01/05/21 with witnessed seizure, AMS. 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 EEG unremarkable. LP on 12/21 showed staph in CSF, felt to be contaminant. Workup for AKI, rhabdomyolysis. Dx Uremic encephalopathy. Course complicated by c/o L foot pain; imaging negative for acute injury. Pt remains hospitalized due to difficulty with placement. SLP evaluation on 01/26/21: 9/27 on Mini Mental State Exam, SLUMS 02/03/21: 2/30; SLUMS 1/27: 1/30. Psych consulted and as of 1/16, pt did not meet criteria for inpatient psychiatric admission.      SLP  Plan  Continue with current plan of care      Recommendations for follow up therapy are one component of a multi-disciplinary discharge planning process, led by the attending physician.  Recommendations may be updated based on patient status, additional functional criteria and insurance authorization.    Recommendations                   Oral Care Recommendations: Oral care BID Follow Up Recommendations: Skilled nursing-short term rehab (<3 hours/day) Assistance recommended at discharge: Frequent or constant Supervision/Assistance SLP Visit Diagnosis: Cognitive communication deficit (R41.841);Attention and concentration deficit Plan: Continue with current plan of care         Steve [name] I. [name], MS, CCC-SLP Acute Rehabilitation Services Office number [phone] Pager [phone]   Steve [name]  03/04/2021, 10:58 AM

## 2021-03-04 NOTE — Progress Notes (Signed)
Physical Therapy Treatment Patient Details Name: Steve [name] MRN: [id number] DOB: Apr 29, 1965 Today's Date: 03/04/2021   History of Present Illness Pt is a 55 y.o. male admitted from group home for convalescence, combativeness and altered sensorium on 01/05/21 with witnessed seizure, AMS. 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 EEG unremarkable. LP on 12/21 showed staph in CSF, felt to be contaminant. Workup for AKI, rhabdomyolysis. Course complicated by c/o L foot pain; imaging negative for acute injury. Pt remains hospitalized due to difficult to place. Pt with psych consult who felt no need for inpt psych. No PMH in chart.    PT Comments    Pt admitted with above diagnosis. Pt met 0/3 goals due to poor cognition and poor balance. Goals revised.  Pt continues to progress  slowly due to issues as stated previously.  Pt with poor safety awareness.   Oriented to self only.  Thinks it is December.  Pt currently with functional limitations due to balance and endurance deficits. Pt will benefit from skilled PT to increase their independence and safety with mobility to allow discharge to the venue listed below.      Recommendations for follow up therapy are one component of a multi-disciplinary discharge planning process, led by the attending physician.  Recommendations may be updated based on patient status, additional functional criteria and insurance authorization.  Follow Up Recommendations  Skilled nursing-short term rehab (<3 hours/day)     Assistance Recommended at Discharge Frequent or constant Supervision/Assistance  Patient can return home with the following A little help with walking and/or transfers;Assistance with cooking/housework;A little help with bathing/dressing/bathroom;Direct supervision/assist for financial management;Direct supervision/assist for medications  management;Assist for transportation   Equipment Recommendations  Rolling walker (2 wheels)    Recommendations for Other Services Speech consult     Precautions / Restrictions Precautions Precautions: Fall Precaution Comments: h/o L foot pain Restrictions Weight Bearing Restrictions: No LLE Weight Bearing: Weight bearing as tolerated Other Position/Activity Restrictions: CAM boot not needed per ortho note     Mobility  Bed Mobility Overal bed mobility: Modified Independent Bed Mobility: Supine to Sit     Supine to sit: Min guard     General bed mobility comments: No assist needed, guard due to impulsivity    Transfers Overall transfer level: Needs assistance Equipment used: None Transfers: Sit to/from Stand Sit to Stand: Min assist, +2 safety/equipment           General transfer comment: Min A for safety due to R lean and ataxic movement, cues for hand placement as well.    Ambulation/Gait Ambulation/Gait assistance: Mod assist, +2 physical assistance Gait Distance (Feet): 190 Feet Assistive device: 2 person hand held assist, 1 person hand held assist Gait Pattern/deviations: Step-through pattern, Decreased stride length, Trunk flexed Gait velocity: decreased Gait velocity interpretation: <1.31 ft/sec, indicative of household ambulator   General Gait Details: Pt intiially ambulated to bathroom and urinated on floor and himself as he missed the toilet.  Came out of bathroom to sink and sat and bathed and changed all pts linens.  pt ambulated with bilat HHA. pt with occasional L knee buckling due to L foot pain/antalgia. Pt requiring constant direction to stay on task and navigate back to room.  Pt with ataxia and at times scissoring with narrow BOS at times causing imbalance needing frequent assist to keep balance while ambulating. Pt tends to veer to his  left and needs guidance for anterior propulsion forward.   Stairs             Wheelchair Mobility     Modified Rankin (Stroke Patients Only)       Balance Overall balance assessment: Needs assistance Sitting-balance support: Feet supported Sitting balance-Leahy Scale: Good Sitting balance - Comments: Impulsively leaning and repositioning multiple times and was able to place his socks on.   Standing balance support: Single extremity supported, During functional activity, Bilateral upper extremity supported Standing balance-Leahy Scale: Poor Standing balance comment: UE support and at times mod assist for dynamic                            Cognition Arousal/Alertness: Awake/alert Behavior During Therapy: Impulsive Overall Cognitive Status: Impaired/Different from baseline Area of Impairment: Orientation, Attention, Memory, Following commands, Safety/judgement, Awareness, Problem solving                 Orientation Level: Time, Situation (once PT said hospital pt recalled hospital) Current Attention Level: Sustained Memory: Decreased short-term memory Following Commands: Follows one step commands with increased time Safety/Judgement: Decreased awareness of safety, Decreased awareness of deficits (pt urinated all over bed and floor, not aware) Awareness: Intellectual Problem Solving: Requires verbal cues, Requires tactile cues, Slow processing General Comments: Pt requires constant re-direction. Pt with zero insight to deficits and zero safety awareness. Pt with no recall of situation.        Exercises Other Exercises Other Exercises: seated BLE AAROM: hip flexion, LAQ x10 reps ea Other Exercises: STS x3 trials    General Comments General comments (skin integrity, edema, etc.): VSS on RA      Pertinent Vitals/Pain Pain Assessment Pain Assessment: Faces Faces Pain Scale: Hurts a little bit Pain Location: left ankle Pain Descriptors / Indicators: Grimacing, Guarding Pain Intervention(s): Limited activity within patient's tolerance, Monitored during  session, Repositioned    Home Living                          Prior Function            PT Goals (current goals can now be found in the care plan section) Acute Rehab PT Goals Patient Stated Goal: None stated PT Goal Formulation: Patient unable to participate in goal setting Time For Goal Achievement: 03/18/21 Potential to Achieve Goals: Fair Progress towards PT goals: Progressing toward goals    Frequency    Min 2X/week      PT Plan Current plan remains appropriate    Co-evaluation              AM-PAC PT "6 Clicks" Mobility   Outcome Measure  Help needed turning from your back to your side while in a flat bed without using bedrails?: None Help needed moving from lying on your back to sitting on the side of a flat bed without using bedrails?: A Little Help needed moving to and from a bed to a chair (including a wheelchair)?: Total Help needed standing up from a chair using your arms (e.g., wheelchair or bedside chair)?: Total Help needed to walk in hospital room?: Total Help needed climbing 3-5 steps with a railing? : Total 6 Click Score: 11    End of Session Equipment Utilized During Treatment: Gait belt Activity Tolerance: Other (comment) (Limited by poor cognition) Patient left: with call bell/phone within reach;with restraints reapplied;in chair;with chair alarm set (posey belt around  chair, chair alarm bad, tele sitter, and bilat mittens) Nurse Communication: Mobility status PT Visit Diagnosis: Other abnormalities of gait and mobility (R26.89);Other symptoms and signs involving the nervous system (R29.898) Pain - Right/Left: Left Pain - part of body: Ankle and joints of foot     Time: 0315-9458 PT Time Calculation (min) (ACUTE ONLY): 28 min  Charges:  $Gait Training: 8-22 mins $Self Care/Home Management: 8-22                     [facility] M,PT Acute Rehab Services [phone] (pager)    Alvira Philips 03/04/2021, 1:50 PM

## 2021-03-04 NOTE — Progress Notes (Signed)
CSW spoke with Steve [name] at [facility] who states the facility is unable to offer patient a bed.  Edwin Dada, MSW, LCSW Transitions of Care   Clinical Social Worker II [phone]

## 2021-03-04 NOTE — Plan of Care (Signed)
Pt is alert oriented x 3. Pt had a shower last night. Pt had large bowel movement. Mittens in placed due to pulling brief off and urine all over sheets.  Pt noted trying to get out of bed multiple times, telesitter in place.   No distress noted.  Restraint order has been renewed due to be renewed in 6 hours,  Problem: Education: Goal: Knowledge of General Education information will improve Description: Including pain rating scale, medication(s)/side effects and non-pharmacologic comfort measures Outcome: Progressing   Problem: Health Behavior/Discharge Planning: Goal: Ability to manage health-related needs will improve Outcome: Progressing   Problem: Clinical Measurements: Goal: Ability to maintain clinical measurements within normal limits will improve Outcome: Progressing Goal: Will remain free from infection Outcome: Progressing Goal: Diagnostic test results will improve Outcome: Progressing Goal: Respiratory complications will improve Outcome: Progressing Goal: Cardiovascular complication will be avoided Outcome: Progressing   Problem: Activity: Goal: Risk for activity intolerance will decrease Outcome: Progressing   Problem: Nutrition: Goal: Adequate nutrition will be maintained Outcome: Progressing   Problem: Coping: Goal: Level of anxiety will decrease Outcome: Progressing   Problem: Elimination: Goal: Will not experience complications related to bowel motility Outcome: Progressing Goal: Will not experience complications related to urinary retention Outcome: Progressing   Problem: Pain Managment: Goal: General experience of comfort will improve Outcome: Progressing   Problem: Safety: Goal: Ability to remain free from injury will improve Outcome: Progressing   Problem: Skin Integrity: Goal: Risk for impaired skin integrity will decrease Outcome: Progressing   Problem: Education: Goal: Expressions of having a comfortable level of knowledge regarding the  disease process will increase Outcome: Progressing   Problem: Coping: Goal: Ability to adjust to condition or change in health will improve Outcome: Progressing Goal: Ability to identify appropriate support needs will improve Outcome: Progressing   Problem: Health Behavior/Discharge Planning: Goal: Compliance with prescribed medication regimen will improve Outcome: Progressing   Problem: Medication: Goal: Risk for medication side effects will decrease Outcome: Progressing   Problem: Clinical Measurements: Goal: Complications related to the disease process, condition or treatment will be avoided or minimized Outcome: Progressing Goal: Diagnostic test results will improve Outcome: Progressing   Problem: Safety: Goal: Verbalization of understanding the information provided will improve Outcome: Progressing   Problem: Self-Concept: Goal: Level of anxiety will decrease Outcome: Progressing Goal: Ability to verbalize feelings about condition will improve Outcome: Progressing

## 2021-03-05 MED ORDER — HALOPERIDOL LACTATE 5 MG/ML IJ SOLN: 2.0000 mg | Freq: Four times a day (QID) | INTRAMUSCULAR | Status: DC | PRN

## 2021-03-05 MED ORDER — HALOPERIDOL LACTATE 5 MG/ML IJ SOLN
2.0000 mg | Freq: Four times a day (QID) | INTRAMUSCULAR | Status: DC | PRN
  Administered 2021-03-05 – 2021-03-07 (×2): 2 mg via INTRAMUSCULAR
  Filled 2021-03-05 (×2): qty 1

## 2021-03-05 MED ORDER — HYDROXYZINE HCL 25 MG PO TABS
25.0000 mg | ORAL_TABLET | Freq: Three times a day (TID) | ORAL | Status: DC
  Administered 2021-03-05 – 2021-03-09 (×13): 25 mg via ORAL
  Filled 2021-03-05 (×13): qty 1

## 2021-03-05 NOTE — Progress Notes (Addendum)
Progress Note   Patient: Steve [name] [id number] DOB: Sep 13, 1965 DOA: 01/05/2021     59 DOS: the patient was seen and examined on 03/05/2021   Brief hospital course: Matthue Rufty is a 56 y.o. male with history of major depression, anxiety who was in a residential ETOH treatment center for last 5 months. He was brought to the ED on 12/20 from a group home for convalescence, combativeness and altered sensorium.  Per collateral history, patient was not acting typical for last few days.  In the ED, patient was confused, combative. Labs showed AKI with creatinine of 3.27, rhabdomyolysis, abnormal LFTs, elevated WBC count at 23.7 and lactic acid level at 7.6. 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 Blood culture was sent, empiric antibiotic started.  IV fluid resuscitation started and patient was initially admitted to ICU. 12/21, underwent LP which showed staph in CSF, felt to be contaminant.  EEG did not show any seizures 12/24, HD catheter was placed but patient did not require HD as creatinine started to improve 12/31, with clinical improvement, patient was transferred out of ICU to [facility]. 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 Also evidence of prior lacunar infarct right basal ganglia Cognitive screening revealed significant cognitive deficits Continue Prozac, BuSpar, trazodone-has known history of major depressive disorder-continue Zyprexa and Depakote for behavioral issues and cognitive impairment-2/1 will increase Zyprexa doses today Follow QTC,  platelets and LFTs periodically while on Zyprexa and Depakote 2/2 LFTs are normal, platelets are normal; QTC 469 ms on 2/1   -Significant improvement in behaviors and impulsivity with the addition of Klonopin.  Posey enclosure bed has been discontinued -Continue Klonopin  TID -Remains impulsive so Posey belt restraint in place. 2/15 attempted to remove belt but within 90 seconds patient was trying to get out of bed unassisted despite instruction in how to use call light and call light placed within reach.  Belt reapplied by staff. -Zyprexa dose increased on 2/10 but had to be decreased due to oversedation.  Also with higher dose in combination with Depakote patient developed mild transaminitis which has resolved with decrease in Zyprexa dose. -2/17 Increased agitation overnight requiring Haldol-begin sch Vistaril 25 mg TID    Witnessed seizure ([place]) No apparent prior history of seizure disorder noting patient was not on AEDs prior to admission Unclear if withdrawal seizure or related to other substances EEG unremarkable.   Continue Keppra at recommendation of neurology  Acute kidney injury with rhabdomyolysis Resolved Creatinine peaked at 7.48 on 12/26.  Nephrology consulted with initial plans to initiate dialysis including placement of [facility] but renal function gradually improved and dialysis not needed and TDC removed  Possible DVT (deep venous thrombosis) ([place]) Per critical care note, hemodialysis cath had a clot on it when it was removed.  UE duplex  12/31: no DVT.    Aspiration pneumonia ([name])-resolved as of 03/04/2021 Completed a course of Zosyn  Elevated transaminases at time of presentation-resolved as of 03/04/2021 Likely secondary to sepsis like physiology at presentation His AST and ALT were significantly elevated to over 700s on admission.  Re emergence mild elevation LFTs-likely due  to Zyprexa and Depakote Due to oversedation Zyprexa dose decreased LFTs have normalized with  decrease in Zyprexa dose. 2/16 valproic acid level 55   Right ventricular dysfunction- (present on admission) Echo 1/2 with EF 60 to 65%.  Cannot rule out a small PFO this finding determined to be clinically insignificant given the patient was asymptomatic  Physical deconditioning- (present on admission) Influenced by patient's poor cognition as well as prior heavy alcohol abuse and malnutrition before admission Therapy continues to recommend SNF placement due to level of assist required with ADLs and IADLs as well as ongoing poor cognition Mobility continues to be influenced not only by cognition but by ambulatory dysfunction/gait and imbalance issues  Severe sepsis without septic shock ([place]) Ruled out Sepsis-like physiology secondary to presentation with profound dehydration, hypoperfusion from dehydration related hypotension seizure activity Chest work-up was negative        Subjective:  Alert, sitting up in bed eating breakfast.  Requiring Posey lap belt to prevent getting out of bed unassisted.  Physical Exam: Vitals:   03/04/21 2355 03/05/21 0308 03/05/21 0500 03/05/21 1153  BP: 104/82 107/75  114/77  Pulse: 82 72  71  Resp: 16 17  18   Temp: 97.8 F (36.6 C) 97.9 F (36.6 C)  98 F (36.7 C)  TempSrc: Oral Oral    SpO2: 99% 98%    Weight:   57.9 kg   Height:       Constitutional: NAD, calm  Respiratory: Anterior lung sounds clear, Normal respiratory effort. RA Cardiovascular: Regular pulse, no peripheral edema.  Normotensive.  S1-S2 Abdomen: no tenderness, no masses palpated. Bowel sounds positive. LBM 2/16 Neurologic: CN 2-12 grossly intact. Sensation intact, Strength 3+-4/5 x all 4 extremities.  Psychiatric: Alert and oriented times name only.  Safety belt for impulsivity  Data Reviewed: There are no new results to review at this time.  Family Communication:  Patient only  Disposition: Status is: Inpatient Remains inpatient appropriate because:  Unsafe  discharge plan secondary to severe cognitive impairment requiring SNF placement.  Currently lacks funding.     A physical therapy consult is indicated based on the patients mobility assessment.   Mobility Assessment (last 72 hours)     Mobility Assessment     Row Name 03/04/21 2000 03/04/21 1300 03/03/21 1700   Does patient have an order for bedrest or is patient medically unstable No - Continue assessment -- --   What is the highest level of mobility based on the progressive mobility assessment? Level 3 (Stands with assist) - Balance while standing  and cannot march in place Level 5 (Walks with assist in room/hall) - Balance while stepping forward/back and can walk in room with assist - Complete Level 5 (Walks with assist in room/hall) - Balance while stepping forward/back and can walk in room with assist - Complete    Row Name 03/03/21 0900       Does patient have an order for bedrest or is patient medically unstable No - Continue assessment     What is the highest level of mobility based on the progressive mobility assessment? Level 5 (Walks with assist in room/hall) - Balance while stepping forward/back and can walk in room with assist - Complete     Is the above level different from baseline mobility prior to current illness? Yes - Recommend PT order                 DVT Prophylaxis  .Rivaroxaban (xarelto) tablet 10 mg , Rivaroxaban (  xarelto) tablet 10 mg   Planned Discharge Destination:  Skilled nursing facility  Medically stable: No-still titrating behavioral meds  COVID vaccination status:  Unknown  Consultants: Nephrology Psychiatry    Procedures: EEG Echocardiogram TDC   Antibiotics: Unasyn x1 dose, cefepime x1 dose, ceftriaxone x1 dose, vancomycin x2 doses     Time spent: 15 minutes  Author: Erin Hearing, NP 03/05/2021 12:37 PM  For on call review [email or web address].

## 2021-03-06 LAB — COMPREHENSIVE METABOLIC PANEL
ALT: 34 U/L (ref 0–44)
AST: 32 U/L (ref 15–41)
Albumin: 3.7 g/dL (ref 3.5–5.0)
Alkaline Phosphatase: 44 U/L (ref 38–126)
Anion gap: 9 (ref 5–15)
BUN: 19 mg/dL (ref 6–20)
CO2: 27 mmol/L (ref 22–32)
Calcium: 9.3 mg/dL (ref 8.9–10.3)
Chloride: 101 mmol/L (ref 98–111)
Creatinine, Ser: 0.62 mg/dL (ref 0.61–1.24)
GFR, Estimated: >60 mL/min (ref >60)
Glucose, Bld: 87 mg/dL (ref 70–99)
Potassium: 3.7 mmol/L (ref 3.5–5.1)
Sodium: 137 mmol/L (ref 135–145)
Total Bilirubin: 0.3 mg/dL (ref 0.3–1.2)
Total Protein: 7.5 g/dL (ref 6.5–8.1)

## 2021-03-06 LAB — CBC WITH DIFFERENTIAL/PLATELET
Abs Immature Granulocytes: 0.01 10*3/uL (ref 0.00–0.07)
Basophils Absolute: 0 10*3/uL (ref 0.0–0.1)
Basophils Relative: 1 %
Eosinophils Absolute: 0.2 10*3/uL (ref 0.0–0.5)
Eosinophils Relative: 3 %
HCT: 37.2 % — ABNORMAL LOW (ref 39.0–52.0)
Hemoglobin: 13.2 g/dL (ref 13.0–17.0)
Immature Granulocytes: 0 %
Lymphocytes Relative: 36 %
Lymphs Abs: 2 10*3/uL (ref 0.7–4.0)
MCH: 33.1 pg (ref 26.0–34.0)
MCHC: 35.5 g/dL (ref 30.0–36.0)
MCV: 93.2 fL (ref 80.0–100.0)
Monocytes Absolute: 0.6 10*3/uL (ref 0.1–1.0)
Monocytes Relative: 10 %
Neutro Abs: 2.9 10*3/uL (ref 1.7–7.7)
Neutrophils Relative %: 50 %
Platelets: 276 10*3/uL (ref 150–400)
RBC: 3.99 MIL/uL — ABNORMAL LOW (ref 4.22–5.81)
RDW: 13 % (ref 11.5–15.5)
WBC: 5.6 10*3/uL (ref 4.0–10.5)
nRBC: 0 % (ref 0.0–0.2)

## 2021-03-06 NOTE — Progress Notes (Signed)
Steve [name]  [id number] DOB: 07/12/1965 DOA: 01/05/2021 PCP: No primary care provider on file.    Brief Narrative:  56 year old with a history of major depression and anxiety who had been residing in a residential alcohol treatment center for 5 months when he was brought to the ED 01/05/2021 with altered mental status and combativeness.  In the ED he was found to be suffering with acute kidney injury with a creatinine of 3.27, rhabdomyolysis, transaminitis, lactic acidosis, and an elevated white count.  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  Significant Events: 01/05/21 admit via ED 12/21 LP unrevealing 12/21 EEG -no evidence of seizure 12/24 hemodialysis catheter placed (ultimately HD not required as renal function improved) 12/31 transfer out of ICU  Consultants:  None  Code Status: FULL CODE  DVT prophylaxis: Xarelto  Interim Hx: Sitting up in bed eating lunch.  Has no complaints.  Appears comfortable.  Is unable to tell me where he is or why he is here.   Assessment & Plan:  Warnicke's encephalopathy and alcoholic dementia Continue Prozac, BuSpar, trazodone, Zyprexa, Depakote -remains impulsive requiring use of Posey belt  Reported seizure activity No prior history of same -possibly withdrawal related -EEG unremarkable -Keppra per Neurology  Acute kidney injury Felt to be due to rhabdomyolysis -creatinine peaked at 7.48 12/26 -improved without need for dialysis -creatinine remains normal  Aspiration pneumonia -resolved No respiratory symptoms whatsoever  Hemodialysis catheter associated DVT On treatment with Xarelto  Transaminitis POA Resolved with LFTs now normal  Physical deconditioning Requires SNF placement   Family Communication: No family present Disposition: Requires SNF placement but has no payer source  Objective: Blood pressure  102/77, pulse 60, temperature 98 F (36.7 C), resp. rate 18, height 5\' 8"  (1.727 m), weight 64.3 kg, SpO2 100 %.  Intake/Output Summary (Last 24 hours) at 03/06/2021 1001 Last data filed at 03/05/2021 2256 Gross per 24 hour  Intake 720 ml  Output --  Net 720 ml   Filed Weights   02/27/21 0501 03/05/21 0500 03/06/21 0500  Weight: 60.7 kg 57.9 kg 64.3 kg    Examination: General: No acute respiratory distress Lungs: Clear to auscultation bilaterally without wheezes or crackles Cardiovascular: Regular rate and rhythm without murmur gallop or rub normal S1 and S2 Abdomen: Nontender, nondistended, soft, bowel sounds positive, no rebound, no ascites, no appreciable mass Extremities: No significant cyanosis, clubbing, or edema bilateral lower extremities  CBC: Recent Labs  Lab 03/06/21 0726  WBC 5.6  NEUTROABS 2.9  HGB 13.2  HCT 37.2*  MCV 93.2  PLT 276   Basic Metabolic Panel: Recent Labs  Lab 03/04/21 0812 03/06/21 0726  NA 138 137  K 3.6 3.7  CL 101 101  CO2 29 27  GLUCOSE 91 87  BUN 20 19  CREATININE 0.68 0.62  CALCIUM 9.6 9.3   GFR: Estimated Creatinine Clearance: 94.9 mL/min (by C-G formula based on SCr of 0.62 mg/dL).  Liver Function Tests: Recent Labs  Lab 03/04/21 0812 03/06/21 0726  AST 29 32  ALT 41 34  ALKPHOS 40 44  BILITOT 0.4 0.3  PROT 7.2 7.5  ALBUMIN 3.8 3.7    Scheduled Meds:  amLODipine  2.5 mg Oral Daily   aspirin  325 mg Oral Daily   Or   aspirin  300 mg Rectal Daily   busPIRone  10 mg Oral BID   clonazePAM  1 mg Oral TID   docusate sodium  100 mg Oral BID   feeding supplement  237 mL Oral BID BM   FLUoxetine  40 mg Oral Daily   folic acid  1 mg Oral Daily   gabapentin  600 mg Oral TID   hydrOXYzine  25 mg Oral TID   levETIRAcetam  500 mg Oral BID   mouth rinse  15 mL Mouth Rinse BID   nicotine  21 mg Transdermal Daily   OLANZapine  5 mg Oral Daily   OLANZapine  7.5 mg Oral QHS   polyethylene glycol  17 g Oral Daily    rivaroxaban  10 mg Oral Daily   traZODone  100 mg Oral QHS   valproic acid  750 mg Oral BID     LOS: 60 days   Lonia Blood, MD [facility] Office  [phone] Pager - Text Page per Loretha Stapler  If 7PM-7AM, please contact night-coverage per Amion 03/06/2021, 10:01 AM

## 2021-03-07 NOTE — Progress Notes (Signed)
Jaydien Ezzell  [id number] DOB: November 25, 1965 DOA: 01/05/2021 PCP: No primary care provider on file.    Brief Narrative:  [id number] with a history of major depression and anxiety who had been residing in a residential alcohol treatment center for 5 months when he was brought to the ED 01/05/2021 with altered mental status and combativeness.  In the ED he was found to be suffering with acute kidney injury with a creatinine of 3.27, rhabdomyolysis, transaminitis, lactic acidosis, and an elevated white count.  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  Significant Events: 01/05/21 admit via ED 12/21 LP unrevealing 12/21 EEG -no evidence of seizure 12/24 hemodialysis catheter placed (ultimately HD not required as renal function improved) 12/31 transfer out of ICU  Consultants:  None  Code Status: FULL CODE  DVT prophylaxis: Xarelto  Interim Hx: No acute events reported overnight.  Afebrile.  Vital signs stable.  Resting comfortably at time of my visit with no evidence of respiratory distress or uncontrolled pain.  She  Assessment & Plan:  Warnicke's encephalopathy and alcoholic dementia Continue Prozac, BuSpar, trazodone, Zyprexa, Depakote - remains impulsive requiring use of Posey belt  Reported seizure activity No prior history of same - possibly withdrawal related - EEG unremarkable - Keppra per Neurology  Acute kidney injury Felt to be due to rhabdomyolysis -creatinine peaked at 7.48 12/26 -improved without need for dialysis -creatinine remains normal at this time  Aspiration pneumonia -resolved No respiratory symptoms whatsoever  Hemodialysis catheter associated DVT - RULED OUT It appears this was actually ruled out w/ a venous duplex 01/16/21 - cont Xarelto and DVT prophy dose only    Transaminitis POA Resolved with LFTs now normal  Physical deconditioning Requires  SNF placement   Family Communication: No family present Disposition: Requires SNF placement but has no payer source  Objective: Blood pressure 108/84, pulse 72, temperature 98 F (36.7 C), temperature source Axillary, resp. rate 18, height 5\' 8"  (1.727 m), weight 64.3 kg, SpO2 100 %.  Intake/Output Summary (Last 24 hours) at 03/07/2021 0847 Last data filed at 03/07/2021 0019 Gross per 24 hour  Intake 480 ml  Output 750 ml  Net -270 ml    Filed Weights   02/27/21 0501 03/05/21 0500 03/06/21 0500  Weight: 60.7 kg 57.9 kg 64.3 kg    Examination: General: No acute respiratory distress Cardiovascular: Regular rate Extremities: No edema bilateral lower extremities  CBC: Recent Labs  Lab 03/06/21 0726  WBC 5.6  NEUTROABS 2.9  HGB 13.2  HCT 37.2*  MCV 93.2  PLT [id number]    Basic Metabolic Panel: Recent Labs  Lab 03/04/21 0812 03/06/21 0726  NA 138 137  K 3.6 3.7  CL 101 101  CO2 29 27  GLUCOSE 91 87  BUN 20 19  CREATININE 0.68 0.62  CALCIUM 9.6 9.3    GFR: Estimated Creatinine Clearance: 94.9 mL/min (by C-G formula based on SCr of 0.62 mg/dL).  Liver Function Tests: Recent Labs  Lab 03/04/21 0812 03/06/21 0726  AST 29 32  ALT 41 34  ALKPHOS 40 44  BILITOT 0.4 0.3  PROT 7.2 7.5  ALBUMIN 3.8 3.7     Scheduled Meds:  amLODipine  2.5 mg Oral Daily   aspirin  325 mg Oral Daily   busPIRone  10 mg Oral BID   clonazePAM  1 mg Oral TID   docusate sodium  100 mg  Oral BID   feeding supplement  237 mL Oral BID BM   FLUoxetine  40 mg Oral Daily   folic acid  1 mg Oral Daily   gabapentin  600 mg Oral TID   hydrOXYzine  25 mg Oral TID   levETIRAcetam  500 mg Oral BID   mouth rinse  15 mL Mouth Rinse BID   nicotine  21 mg Transdermal Daily   OLANZapine  5 mg Oral Daily   OLANZapine  7.5 mg Oral QHS   polyethylene glycol  17 g Oral Daily   rivaroxaban  10 mg Oral Daily   traZODone  100 mg Oral QHS   valproic acid  750 mg Oral BID     LOS: 61 days    Cherene Altes, MD [facility] Office  [phone] Pager - Text Page per Shea Evans  If 7PM-7AM, please contact night-coverage per Amion 03/07/2021, 8:47 AM

## 2021-03-08 DIAGNOSIS — E46 Unspecified protein-calorie malnutrition: Secondary | ICD-10-CM | POA: Insufficient documentation

## 2021-03-08 MED ORDER — ENSURE ENLIVE PO LIQD
237.0000 mL | Freq: Three times a day (TID) | ORAL | Status: DC
  Administered 2021-03-08 – 2021-05-14 (×226): 237 mL via ORAL

## 2021-03-08 NOTE — Assessment & Plan Note (Signed)
Patient having difficulty eating independently and completing meal We will have OT evaluate to determine if adaptive devices indicated Appears to have some visual disturbance as well contributing Increase protein shakes from twice daily to AC/HS

## 2021-03-08 NOTE — Progress Notes (Signed)
Progress Note   Patient: Steve [name] [id number] DOB: 05-09-1965 DOA: 01/05/2021     62 DOS: the patient was seen and examined on 03/08/2021   Brief hospital course: Steve [name] is a 56 y.o. male with history of major depression, anxiety who was in a residential ETOH treatment center for last 5 months. He was brought to the ED on 12/20 from a group home for convalescence, combativeness and altered sensorium.  Per collateral history, patient was not acting typical for last few days.  In the ED, patient was confused, combative. Labs showed AKI with creatinine of 3.27, rhabdomyolysis, abnormal LFTs, elevated WBC count at 23.7 and lactic acid level at 7.6. 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 Blood culture was sent, empiric antibiotic started.  IV fluid resuscitation started and patient was initially admitted to ICU. 12/21, underwent LP which showed staph in CSF, felt to be contaminant.  EEG did not show any seizures 12/24, HD catheter was placed but patient did not require HD as creatinine started to improve 12/31, with clinical improvement, patient was transferred out of ICU to [name]. 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 Also evidence of prior lacunar infarct right basal ganglia Cognitive screening revealed significant cognitive deficits Continue Prozac, BuSpar, trazodone-has known history of major depressive disorder-continue Zyprexa and Depakote for behavioral issues and cognitive impairment Follow QTC, platelets and LFTs periodically while on  Zyprexa and Depakote -Significant improvement in behaviors and impulsivity with the addition of Klonopin TID.  -Remains impulsive so Posey belt restraint in place. -Continue sch Vistaril 25 mg TID -Having difficulty with independent feeding.  We will ask OT to evaluate and determine if patient needs adaptive devices for feeding    Witnessed seizure ([name]) No apparent prior history of seizure disorder noting patient was not on AEDs prior to admission Unclear if withdrawal seizure or related to other substances EEG unremarkable.   Continue Keppra at recommendation of neurology  Acute kidney injury with rhabdomyolysis Resolved Creatinine peaked at 7.48 on 12/26.  Nephrology consulted with initial plans to initiate dialysis including placement of [facility] but renal function gradually improved and dialysis not needed and TDC removed  Possible DVT (deep venous thrombosis) ([name]) Per critical care note, hemodialysis cath had a clot on it when it was removed.  UE duplex  12/31: no DVT.    Aspiration pneumonia ([name])-resolved as of 03/04/2021 Completed a course of Zosyn  Elevated transaminases at time of presentation-resolved as of 03/04/2021 Likely secondary to sepsis like physiology at presentation His AST and ALT were significantly elevated to over 700s on admission.  Re emergence mild elevation LFTs-likely due to Zyprexa and Depakote Due to oversedation Zyprexa dose decreased LFTs have normalized with decrease in Zyprexa dose. 2/16 valproic acid level 55   Right ventricular dysfunction- (present on admission) Echo 1/2 with EF 60 to 65%.  Cannot rule out a small PFO this finding determined to be clinically insignificant given the patient was asymptomatic  Physical deconditioning- (present on admission) Influenced by patient's poor cognition as well as prior heavy alcohol abuse and malnutrition before admission Therapy continues to recommend  SNF placement due to level of assist required with  ADLs and IADLs as well as ongoing poor cognition Mobility continues to be influenced not only by cognition but by ambulatory dysfunction/gait and imbalance issues  Severe sepsis without septic shock ([name]) Ruled out Sepsis-like physiology secondary to presentation with profound dehydration, hypoperfusion from dehydration related hypotension seizure activity Chest work-up was negative  Protein calorie malnutrition ([name]) Patient having difficulty eating independently and completing meal We will have OT evaluate to determine if adaptive devices indicated Appears to have some visual disturbance as well contributing Increase protein shakes from twice daily to AC/HS        Subjective:  Remains confused.  Noted having difficulty managing independent feeding.  Seems to be influenced somewhat by visual impairment.  Requires Posey belt due to impulsivity.  Physical Exam: Vitals:   03/07/21 1933 03/07/21 2330 03/08/21 0347 03/08/21 0743  BP: 116/82 101/77 109/82 107/81  Pulse: 72 84 75 78  Resp: 16 16 15 16   Temp: 97.6 F (36.4 C) 97.7 F (36.5 C) (!) 97.5 F (36.4 C) (!) 97.5 F (36.4 C)  TempSrc: Oral Oral Oral Oral  SpO2: 98%   99%  Weight:      Height:       Constitutional: NAD, calm  Respiratory: Anterior lung sounds clear, Normal respiratory effort. RA Cardiovascular: Regular pulse, no peripheral edema.  Normotensive.  S1-S2 Abdomen: no tenderness, no masses palpated. Bowel sounds positive. LBM 2/16 Neurologic: CN 2-12 grossly intact. Sensation intact, Strength 3+-4/5 x all 4 extremities.  Psychiatric: Alert and oriented times name only.  Safety belt for impulsivity  Data Reviewed: There are no new results to review at this time.  Family Communication:  Patient only  Disposition: Status is: Inpatient Remains inpatient appropriate because:  Unsafe discharge plan secondary to severe cognitive impairment requiring SNF placement.  Currently lacks funding.     A  physical therapy consult is indicated based on the patients mobility assessment.   Mobility Assessment (last 72 hours)     Mobility Assessment     Row Name 03/05/21 2230       Does patient have an order for bedrest or is patient medically unstable No - Continue assessment     What is the highest level of mobility based on the progressive mobility assessment? Level 4 (Walks with assist in room) - Balance while marching in place and cannot step forward and back - Complete     Is the above level different from baseline mobility prior to current illness? Yes - Recommend PT order                 DVT Prophylaxis  .Rivaroxaban (xarelto) tablet 10 mg , Rivaroxaban (xarelto) tablet 10 mg   Planned Discharge Destination:  Skilled nursing facility  Medically stable: No-still titrating behavioral meds  COVID vaccination status:  Unknown  Consultants: Nephrology Psychiatry    Procedures: EEG Echocardiogram TDC   Antibiotics: Unasyn x1 dose, cefepime x1 dose, ceftriaxone x1 dose, vancomycin x2 doses     Time spent: 15 minutes  Author: Erin Hearing, NP 03/08/2021 10:26 AM  For on call review [email or web address].

## 2021-03-09 ENCOUNTER — Inpatient Hospital Stay (HOSPITAL_COMMUNITY): Payer: Medicaid Other

## 2021-03-09 DIAGNOSIS — R262 Difficulty in walking, not elsewhere classified: Secondary | ICD-10-CM | POA: Insufficient documentation

## 2021-03-09 LAB — CBC WITH DIFFERENTIAL/PLATELET
Abs Immature Granulocytes: 0.01 10*3/uL (ref 0.00–0.07)
Basophils Absolute: 0 10*3/uL (ref 0.0–0.1)
Basophils Relative: 1 %
Eosinophils Absolute: 0.2 10*3/uL (ref 0.0–0.5)
Eosinophils Relative: 4 %
HCT: 39.1 % (ref 39.0–52.0)
Hemoglobin: 13.6 g/dL (ref 13.0–17.0)
Immature Granulocytes: 0 %
Lymphocytes Relative: 39 %
Lymphs Abs: 2.2 10*3/uL (ref 0.7–4.0)
MCH: 32.5 pg (ref 26.0–34.0)
MCHC: 34.8 g/dL (ref 30.0–36.0)
MCV: 93.5 fL (ref 80.0–100.0)
Monocytes Absolute: 0.6 10*3/uL (ref 0.1–1.0)
Monocytes Relative: 11 %
Neutro Abs: 2.5 10*3/uL (ref 1.7–7.7)
Neutrophils Relative %: 45 %
Platelets: 265 10*3/uL (ref 150–400)
RBC: 4.18 MIL/uL — ABNORMAL LOW (ref 4.22–5.81)
RDW: 13.2 % (ref 11.5–15.5)
WBC: 5.6 10*3/uL (ref 4.0–10.5)
nRBC: 0 % (ref 0.0–0.2)

## 2021-03-09 LAB — C-REACTIVE PROTEIN: CRP: 2.1 mg/dL — ABNORMAL HIGH (ref <1.0)

## 2021-03-09 LAB — BLOOD GAS, ARTERIAL
Acid-Base Excess: 6 mmol/L — ABNORMAL HIGH (ref 0.0–2.0)
Bicarbonate: 30.6 mmol/L — ABNORMAL HIGH (ref 20.0–28.0)
Drawn by: 59156
FIO2: 21 %
O2 Saturation: 98.6 %
Patient temperature: 36.7
pCO2 arterial: 42 mmHg (ref 32–48)
pH, Arterial: 7.46 — ABNORMAL HIGH (ref 7.35–7.45)
pO2, Arterial: 95 mmHg (ref 83–108)

## 2021-03-09 LAB — COMPREHENSIVE METABOLIC PANEL
ALT: 33 U/L (ref 0–44)
AST: 32 U/L (ref 15–41)
Albumin: 3.8 g/dL (ref 3.5–5.0)
Alkaline Phosphatase: 58 U/L (ref 38–126)
Anion gap: 9 (ref 5–15)
BUN: 28 mg/dL — ABNORMAL HIGH (ref 6–20)
CO2: 29 mmol/L (ref 22–32)
Calcium: 9.7 mg/dL (ref 8.9–10.3)
Chloride: 102 mmol/L (ref 98–111)
Creatinine, Ser: 0.79 mg/dL (ref 0.61–1.24)
GFR, Estimated: >60 mL/min (ref >60)
Glucose, Bld: 91 mg/dL (ref 70–99)
Potassium: 3.8 mmol/L (ref 3.5–5.1)
Sodium: 140 mmol/L (ref 135–145)
Total Bilirubin: 0.2 mg/dL — ABNORMAL LOW (ref 0.3–1.2)
Total Protein: 7.9 g/dL (ref 6.5–8.1)

## 2021-03-09 LAB — URINALYSIS, ROUTINE W REFLEX MICROSCOPIC
Bilirubin Urine: NEGATIVE
Glucose, UA: NEGATIVE mg/dL
Hgb urine dipstick: NEGATIVE
Ketones, ur: 5 mg/dL — AB
Leukocytes,Ua: NEGATIVE
Nitrite: NEGATIVE
Protein, ur: NEGATIVE mg/dL
Specific Gravity, Urine: 1.027 (ref 1.005–1.030)
pH: 5 (ref 5.0–8.0)

## 2021-03-09 LAB — AMMONIA: Ammonia: 29 umol/L (ref 9–35)

## 2021-03-09 LAB — SEDIMENTATION RATE: Sed Rate: 39 mm/hr — ABNORMAL HIGH (ref 0–16)

## 2021-03-09 IMAGING — DX DG CHEST 1V PORT
1 series · 1 of 1 positions shown · non-contrast
Comparison: Chest radiograph dated [DATE]

CLINICAL DATA: Fever

EXAM:
PORTABLE CHEST 1 VIEW

[chest ap]
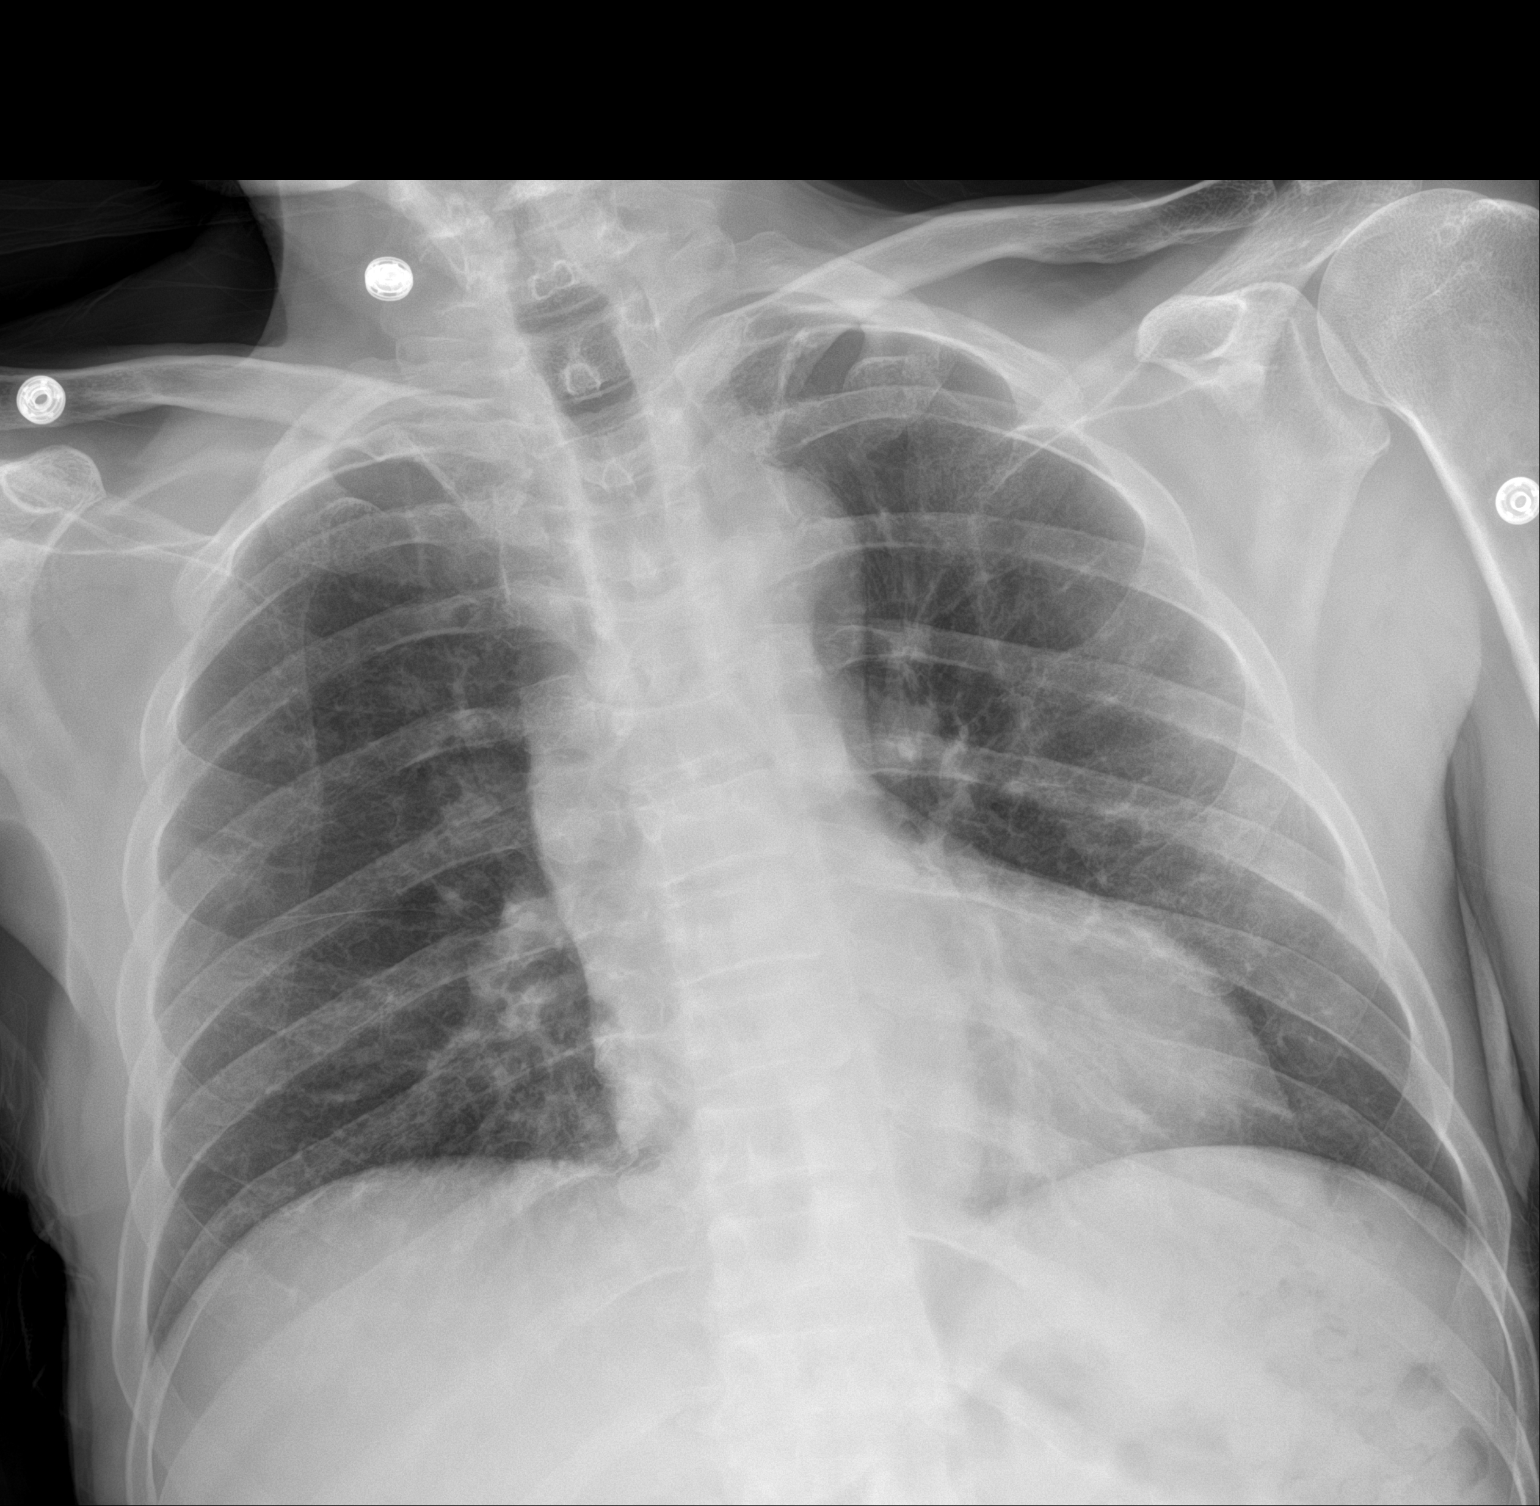

[1 of 1 positions shown; findings below may reference images not displayed]

FINDINGS: The heart size and mediastinal contours are within normal limits.
Both lungs are clear. The visualized skeletal structures are
unremarkable.
IMPRESSION: No active disease.

## 2021-03-09 MED ORDER — VALPROIC ACID 250 MG PO CAPS
500.0000 mg | ORAL_CAPSULE | Freq: Two times a day (BID) | ORAL | Status: DC
  Administered 2021-03-09 – 2021-03-13 (×8): 500 mg via ORAL
  Filled 2021-03-09 (×8): qty 2

## 2021-03-09 MED ORDER — GABAPENTIN 300 MG PO CAPS
300.0000 mg | ORAL_CAPSULE | Freq: Three times a day (TID) | ORAL | Status: DC
  Administered 2021-03-09 – 2021-03-10 (×4): 300 mg via ORAL
  Filled 2021-03-09 (×4): qty 1

## 2021-03-09 MED ORDER — CLONAZEPAM 0.5 MG PO TABS
0.5000 mg | ORAL_TABLET | Freq: Three times a day (TID) | ORAL | Status: DC
  Administered 2021-03-09 – 2021-03-10 (×2): 0.5 mg via ORAL
  Filled 2021-03-09 (×2): qty 1

## 2021-03-09 MED ORDER — HYDROXYZINE HCL 25 MG PO TABS: 50.0000 mg | ORAL_TABLET | Freq: Every day | ORAL | Status: DC

## 2021-03-09 MED ORDER — HYDROXYZINE HCL 25 MG PO TABS
25.0000 mg | ORAL_TABLET | Freq: Every day | ORAL | Status: DC
  Administered 2021-03-10: 25 mg via ORAL
  Filled 2021-03-09: qty 1

## 2021-03-09 MED ORDER — OLANZAPINE 2.5 MG PO TABS
2.5000 mg | ORAL_TABLET | Freq: Every day | ORAL | Status: DC
  Administered 2021-03-10 – 2021-03-14 (×5): 2.5 mg via ORAL
  Filled 2021-03-09 (×5): qty 1

## 2021-03-09 NOTE — Progress Notes (Signed)
Speech Language Pathology Treatment: Cognitive-Linquistic  Patient Details Name: Steve [name] MRN: [id number] DOB: [birth year] Today's Date: 03/09/2021 Time: [id number] SLP Time Calculation (min) (ACUTE ONLY): 18 min  Assessment / Plan / Recommendation Clinical Impression  Pt's performance appeared to be limited by possible daily meds (Keppra and Klonopin) as pt would focus attention on therapist when asked but most of the time staring off and difficulty visually focusing. His speech was also dysfluent and whispered and could not modulate despite max cues. Needed cues to locate clock on wall and use to state time, date, day of week and date. Total cues to add money (fake bills) and could identify one of the two bills. He knew he needed to call nurse for help and able to locate call bell in chair beside him. Continue ST.   HPI HPI: Pt is a 56 y.o. male admitted from group home (substance abuse rehab?) on 01/05/21 with witnessed seizure, AMS. 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 EEG unremarkable. LP on 12/21 showed staph in CSF, felt to be contaminant. Workup for AKI, rhabdomyolysis. Dx Uremic encephalopathy. Course complicated by c/o L foot pain; imaging negative for acute injury. Pt remains hospitalized due to difficulty with placement. SLP evaluation on 01/26/21: 9/27 on [place] Exam, SLUMS 02/03/21: 2/30; SLUMS 1/27: 1/30. Psych consulted and as of 1/16, pt did not meet criteria for inpatient psychiatric admission.      SLP Plan  Continue with current plan of care      Recommendations for follow up therapy are one component of a multi-disciplinary discharge planning process, led by the attending physician.  Recommendations may be updated based on patient status, additional functional criteria and insurance authorization.    Recommendations                    Oral Care Recommendations: Oral care BID Follow Up Recommendations: Skilled nursing-short term rehab (<3 hours/day) Assistance recommended at discharge: Frequent or constant Supervision/Assistance SLP Visit Diagnosis: Cognitive communication deficit (R41.841);Attention and concentration deficit Attention and concentration deficit following: Other cerebrovascular disease Plan: Continue with current plan of care           Houston Siren  03/09/2021, 12:14 PM

## 2021-03-09 NOTE — Progress Notes (Signed)
Physical Therapy Treatment Patient Details Name: Steve [name] MRN: [id number] DOB: 1966/01/05 Today's Date: 03/09/2021   History of Present Illness Pt is a 56 y.o. male admitted from group home for convalescence, combativeness and altered sensorium on 01/05/21 with witnessed seizure, AMS. 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 EEG unremarkable. LP on 12/21 showed staph in CSF, felt to be contaminant. Workup for AKI, rhabdomyolysis. Course complicated by c/o L foot pain; imaging negative for acute injury. Pt remains hospitalized due to difficult to place. Pt with psych consult who felt no need for inpt psych. No PMH in chart.    PT Comments    Pt admitted with above diagnosis. Pt was able to ambulate but continues to need +2 mod assist for safety and balance. Pt with significant cognitive deficits that limit his ability to have carryover from session to session. Will continue PT.  Pt currently with functional limitations due to balance and endurance deficits. Pt will benefit from skilled PT to increase their independence and safety with mobility to allow discharge to the venue listed below.      Recommendations for follow up therapy are one component of a multi-disciplinary discharge planning process, led by the attending physician.  Recommendations may be updated based on patient status, additional functional criteria and insurance authorization.  Follow Up Recommendations  Skilled nursing-short term rehab (<3 hours/day)     Assistance Recommended at Discharge Frequent or constant Supervision/Assistance  Patient can return home with the following A little help with walking and/or transfers;Assistance with cooking/housework;A little help with bathing/dressing/bathroom;Direct supervision/assist for financial management;Direct supervision/assist for medications  management;Assist for transportation   Equipment Recommendations  Rolling walker (2 wheels)    Recommendations for Other Services Speech consult     Precautions / Restrictions Precautions Precautions: Fall Precaution Comments: h/o L foot pain Restrictions Weight Bearing Restrictions: No LLE Weight Bearing: Weight bearing as tolerated Other Position/Activity Restrictions: CAM boot not needed per ortho note     Mobility  Bed Mobility   Bed Mobility: Supine to Sit     Supine to sit: Min assist     General bed mobility comments: Pt slightly lethargic intiially and needed some assist to come to EOB    Transfers Overall transfer level: Needs assistance Equipment used: None Transfers: Sit to/from Stand Sit to Stand: Min assist, +2 safety/equipment           General transfer comment: Min A for safety due to R lean and ataxic movement, cues for hand placement as well.    Ambulation/Gait Ambulation/Gait assistance: Mod assist, +2 physical assistance Gait Distance (Feet): 250 Feet Assistive device: 2 person hand held assist, 1 person hand held assist Gait Pattern/deviations: Step-through pattern, Decreased stride length, Trunk flexed Gait velocity: decreased Gait velocity interpretation: <1.31 ft/sec, indicative of household ambulator   General Gait Details: Pt ambulated with bilat HHA. pt with occasional L knee buckling due to L foot pain/antalgia. Pt requiring constant direction to stay on task and navigate back to room.  Pt with ataxia and at times scissoring with narrow BOS at times causing imbalance needing frequent assist to keep balance while ambulating. Pt tends to veer to his left and needs guidance for anterior propulsion forward.   Stairs             Wheelchair Mobility    Modified Rankin (Stroke Patients Only)  Balance Overall balance assessment: Needs assistance Sitting-balance support: Feet supported Sitting balance-Leahy Scale:  Good Sitting balance - Comments: Impulsively leaning and repositioning   Standing balance support: Single extremity supported, During functional activity, Bilateral upper extremity supported Standing balance-Leahy Scale: Poor Standing balance comment: UE support and at times mod assist for dynamic                            Cognition Arousal/Alertness: Awake/alert Behavior During Therapy: Impulsive Overall Cognitive Status: Impaired/Different from baseline Area of Impairment: Orientation, Attention, Memory, Following commands, Safety/judgement, Awareness, Problem solving                 Orientation Level: Time, Situation (once PT said hospital pt recalled hospital) Current Attention Level: Sustained Memory: Decreased short-term memory Following Commands: Follows one step commands with increased time Safety/Judgement: Decreased awareness of safety, Decreased awareness of deficits (pt urinated all over bed and floor, not aware) Awareness: Intellectual Problem Solving: Requires verbal cues, Requires tactile cues, Slow processing General Comments: Pt requires constant re-direction. Pt with zero insight to deficits and zero safety awareness. Pt with no recall of situation.        Exercises Other Exercises Other Exercises: seated BLE AAROM: hip flexion, LAQ x10 reps ea Other Exercises: STS x3 trials    General Comments General comments (skin integrity, edema, etc.): VSS      Pertinent Vitals/Pain Pain Assessment Pain Assessment: No/denies pain    Home Living                          Prior Function            PT Goals (current goals can now be found in the care plan section) Acute Rehab PT Goals Patient Stated Goal: None stated Progress towards PT goals: Progressing toward goals    Frequency    Min 2X/week      PT Plan Current plan remains appropriate    Co-evaluation              AM-PAC PT "6 Clicks" Mobility   Outcome  Measure  Help needed turning from your back to your side while in a flat bed without using bedrails?: None Help needed moving from lying on your back to sitting on the side of a flat bed without using bedrails?: A Little Help needed moving to and from a bed to a chair (including a wheelchair)?: Total Help needed standing up from a chair using your arms (e.g., wheelchair or bedside chair)?: Total Help needed to walk in hospital room?: Total Help needed climbing 3-5 steps with a railing? : Total 6 Click Score: 11    End of Session Equipment Utilized During Treatment: Gait belt Activity Tolerance: Other (comment) (Limited by poor cognition) Patient left: with call bell/phone within reach;with restraints reapplied;in chair;with chair alarm set (posey belt around chair, chair alarm bad, tele sitter, and bilat mittens) Nurse Communication: Mobility status PT Visit Diagnosis: Other abnormalities of gait and mobility (R26.89);Other symptoms and signs involving the nervous system (R29.898) Pain - Right/Left: Left Pain - part of body: Ankle and joints of foot     Time: 0902-0932 PT Time Calculation (min) (ACUTE ONLY): 30 min  Charges:  $Gait Training: 23-37 mins                     Shila Kruczek M,PT Acute Rehab Services [phone] (pager)    Arrie Aran  Samson Frederic 03/09/2021, 11:51 AM

## 2021-03-09 NOTE — Plan of Care (Signed)
°  Problem: Clinical Measurements: Goal: Ability to maintain clinical measurements within normal limits will improve Outcome: Progressing Goal: Will remain free from infection Outcome: Progressing Goal: Diagnostic test results will improve Outcome: Progressing Goal: Respiratory complications will improve Outcome: Progressing Goal: Cardiovascular complication will be avoided Outcome: Progressing   Problem: Health Behavior/Discharge Planning: Goal: Ability to manage health-related needs will improve Outcome: Progressing   Problem: Education: Goal: Knowledge of General Education information will improve Description: Including pain rating scale, medication(s)/side effects and non-pharmacologic comfort measures Outcome: Not Progressing   Problem: Activity: Goal: Risk for activity intolerance will decrease Outcome: Progressing   Problem: Nutrition: Goal: Adequate nutrition will be maintained Outcome: Progressing

## 2021-03-09 NOTE — Progress Notes (Signed)
Paged Revonda Standard, NP in regards to patient status "Hey, pt is super lethargic today...this is my first day having him so not sure his baseline and I know you cut down on his atarax but wondering about his clonazepam due this afternoon if I should hold that as well. thank you" She is currently ordering an ammonia level and an ABG.

## 2021-03-09 NOTE — Progress Notes (Addendum)
Progress Note   Patient: Steve [name] [id number] DOB: 10-25-1965 DOA: 01/05/2021     63 DOS: the patient was seen and examined on 03/09/2021   Brief hospital course: Steve [name] is a 56 y.o. male with history of major depression, anxiety who was in a residential ETOH treatment center for last 5 months. He was brought to the ED on 12/20 from a group home for convalescence, combativeness and altered sensorium.  Per collateral history, patient was not acting typical for last few days.  In the ED, patient was confused, combative. Labs showed AKI with creatinine of 3.27, rhabdomyolysis, abnormal LFTs, elevated WBC count at 23.7 and lactic acid level at 7.6. 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 Blood culture was sent, empiric antibiotic started.  IV fluid resuscitation started and patient was initially admitted to ICU. 12/21, underwent LP which showed staph in CSF, felt to be contaminant.  EEG did not show any seizures 12/24, HD catheter was placed but patient did not require HD as creatinine started to improve 12/31, with clinical improvement, patient was transferred out of ICU to [facility]. 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 Also evidence of prior lacunar infarct right basal ganglia Cognitive screening revealed significant cognitive deficits Continue Prozac, BuSpar, trazodone-has known history of major depressive disorder-continue Zyprexa and Depakote for behavioral issues and cognitive impairment Follow QTC, platelets and LFTs periodically while on  Zyprexa and Depakote -Significant improvement in behaviors and impulsivity with the addition of Klonopin TID.  -Remains impulsive so Posey belt restraint in place. -Patient reporting daytime grogginess/sleepiness so will change Vistaril to 25 mg at HS -Having difficulty with independent feeding.  We will ask OT to evaluate and determine if patient needs adaptive devices for feeding  2/21 225 pm: Lethargy has worsened- will hold current Klonopin dose and decrease dose. Have also decresed neurontin, zyprexa and depakote doses. Will check CXR, ABG and ammonia level. HAVE ALSO STARTED INFECTIOUS WORK UP    Witnessed seizure ([name]) No apparent prior history of seizure disorder noting patient was not on AEDs prior to admission Unclear if withdrawal seizure or related to other substances EEG unremarkable.   Continue Keppra at recommendation of neurology  Acute kidney injury with rhabdomyolysis Resolved Creatinine peaked at 7.48 on 12/26.  Nephrology consulted with initial plans to initiate dialysis including placement of [facility] but renal function gradually improved and dialysis not needed and TDC removed  Possible DVT (deep venous thrombosis) ([name]) Per critical care note, hemodialysis cath had a clot on it when it was removed.  UE duplex  12/31: no DVT.    Aspiration pneumonia ([name])-resolved as of 03/04/2021 Completed a course of Zosyn  Elevated transaminases at time of presentation-resolved as of 03/04/2021 Likely secondary to sepsis like physiology at presentation His AST and ALT were significantly elevated to over 700s on admission.  Re emergence mild elevation LFTs-likely due to Zyprexa and Depakote Due to oversedation Zyprexa dose decreased LFTs have normalized with decrease in Zyprexa dose. 2/16 valproic acid level 55   Right ventricular dysfunction- (present on admission) Echo 1/2 with EF 60 to 65%.  Cannot  rule out a small PFO this finding determined to be clinically insignificant  given the patient was asymptomatic  Physical deconditioning 2/2 ambulatory dysfunction- (present on admission) Influenced by patient's poor cognition as well as prior heavy alcohol abuse and malnutrition before admission Therapy continues to recommend SNF placement due to level of assist required with ADLs and IADLs as well as ongoing poor cognition Mobility continues to be influenced not only by cognition but by ambulatory dysfunction/gait and imbalance issues  Severe sepsis without septic shock ([name]) Ruled out Sepsis-like physiology secondary to presentation with profound dehydration, hypoperfusion from dehydration related hypotension seizure activity Chest work-up was negative  Protein calorie malnutrition ([name]) Patient having difficulty eating independently and completing meal We will have OT evaluate to determine if adaptive devices indicated Appears to have some visual disturbance as well contributing Increase protein shakes from twice daily to AC/HS        Subjective:  Patient reports feeling excessively sleepy during the daytime hours.  Physical Exam: Vitals:   03/08/21 2315 03/09/21 0340 03/09/21 0733 03/09/21 1107  BP: 103/84 106/69 116/79 102/81  Pulse: 82 67 70 91  Resp: 18 16 16 16   Temp: 97.8 F (36.6 C) 97.7 F (36.5 C)  98.1 F (36.7 C)  TempSrc: Oral   Oral  SpO2:   99% 100%  Weight:      Height:       Constitutional: NAD, calm  Respiratory: Anterior lung sounds clear, Normal respiratory effort. RA Cardiovascular: Regular pulse, no peripheral edema.  Normotensive.  S1-S2 Abdomen: no tenderness, no masses palpated. Bowel sounds positive. LBM 2/20-continues to have difficulty eating independently Neurologic: CN 2-12 grossly intact. Sensation intact, Strength 3+-4/5 x all 4 extremities.  Psychiatric: Alert and oriented times name only.  Safety belt for impulsivity  Data Reviewed: There are no new results to review at this time.  Family Communication:   Patient only  Disposition: Status is: Inpatient Remains inpatient appropriate because:  Unsafe discharge plan secondary to severe cognitive impairment requiring SNF placement.  Currently lacks funding.     A physical therapy consult is indicated based on the patients mobility assessment.   Mobility Assessment (last 72 hours)     Mobility Assessment     Row Name 03/09/21 1100 03/09/21 0600 03/09/21 0500   Does patient have an order for bedrest or is patient medically unstable -- No - Continue assessment No - Continue assessment   What is the highest level of mobility based on the progressive mobility assessment? Level 5 (Walks with assist in room/hall) - Balance while stepping forward/back and can walk in room with assist - Complete Level 5 (Walks with assist in room/hall) - Balance while stepping forward/back and can walk in room with assist - Complete Level 5 (Walks with assist in room/hall) - Balance while stepping forward/back and can walk in room with assist - Complete   Is the above level different from baseline mobility prior to current illness? -- Yes - Recommend PT order Yes - Recommend PT order    Row Name 03/09/21 0400 03/09/21 0316 03/09/21 0200   Does patient have an order for bedrest or is patient medically unstable No - Continue assessment No - Continue assessment No - Continue assessment   What is the highest level of mobility based on the progressive mobility assessment? Level 5 (Walks with assist in room/hall) - Balance while stepping forward/back and can walk in room with assist - Complete Level 5 (Walks with assist in room/hall) - Balance  while stepping forward/back and can walk in room with assist - Complete Level 5 (Walks with assist in room/hall) - Balance while stepping forward/back and can walk in room with assist - Complete   Is the above level different from baseline mobility prior to current illness? Yes - Recommend PT order Yes - Recommend PT order Yes -  Recommend PT order    [place] Name 03/09/21 0000       Does patient have an order for bedrest or is patient medically unstable No - Continue assessment     What is the highest level of mobility based on the progressive mobility assessment? Level 5 (Walks with assist in room/hall) - Balance while stepping forward/back and can walk in room with assist - Complete     Is the above level different from baseline mobility prior to current illness? Yes - Recommend PT order                 DVT Prophylaxis  .Rivaroxaban (xarelto) tablet 10 mg , Rivaroxaban (xarelto) tablet 10 mg   Planned Discharge Destination:  Skilled nursing facility  Medically stable: No-still titrating behavioral meds  COVID vaccination status:  Unknown  Consultants: Nephrology Psychiatry    Procedures: EEG Echocardiogram TDC   Antibiotics: Unasyn x1 dose, cefepime x1 dose, ceftriaxone x1 dose, vancomycin x2 doses     Time spent: 15 minutes  Author: Erin Hearing, NP 03/09/2021 2:44 PM  For on call review [email or web address].

## 2021-03-10 DIAGNOSIS — A419 Sepsis, unspecified organism: Principal | ICD-10-CM

## 2021-03-10 DIAGNOSIS — R410 Disorientation, unspecified: Secondary | ICD-10-CM

## 2021-03-10 LAB — BASIC METABOLIC PANEL
Anion gap: 12 (ref 5–15)
BUN: 30 mg/dL — ABNORMAL HIGH (ref 6–20)
CO2: 25 mmol/L (ref 22–32)
Calcium: 9.8 mg/dL (ref 8.9–10.3)
Chloride: 103 mmol/L (ref 98–111)
Creatinine, Ser: 0.6 mg/dL — ABNORMAL LOW (ref 0.61–1.24)
GFR, Estimated: >60 mL/min (ref >60)
Glucose, Bld: 111 mg/dL — ABNORMAL HIGH (ref 70–99)
Potassium: 3.8 mmol/L (ref 3.5–5.1)
Sodium: 140 mmol/L (ref 135–145)

## 2021-03-10 MED ORDER — OLANZAPINE 2.5 MG PO TABS
5.0000 mg | ORAL_TABLET | Freq: Every day | ORAL | Status: DC
Start: 2021-03-10 — End: 2021-03-11
  Administered 2021-03-10: 5 mg via ORAL
  Filled 2021-03-10: qty 2

## 2021-03-10 MED ORDER — TRAZODONE HCL 50 MG PO TABS
50.0000 mg | ORAL_TABLET | Freq: Every evening | ORAL | Status: DC | PRN
  Administered 2021-03-10 – 2021-03-19 (×5): 50 mg via ORAL
  Filled 2021-03-10 (×5): qty 1

## 2021-03-10 MED ORDER — CLONAZEPAM 0.5 MG PO TABS
0.5000 mg | ORAL_TABLET | Freq: Two times a day (BID) | ORAL | Status: DC
  Administered 2021-03-10 – 2021-03-16 (×13): 0.5 mg via ORAL
  Filled 2021-03-10 (×13): qty 1

## 2021-03-10 MED ORDER — SODIUM CHLORIDE 0.9 % IV SOLN
INTRAVENOUS | Status: DC
Start: 2021-03-10 — End: 2021-03-11

## 2021-03-10 NOTE — Progress Notes (Signed)
Speech Language Pathology Treatment: Cognitive-Linquistic  Patient Details Name: Kasir Hallenbeck MRN: [id number] DOB: 11/26/1965 Today's Date: 03/10/2021 Time: 2353-6144 SLP Time Calculation (min) (ACUTE ONLY): 19 min  Assessment / Plan / Recommendation Clinical Impression  Pt in bed with NT assisting with consumption of pm meal upon SLP arrival. Pt oriented to self and able to tell clinician that he was in a hospital, city incorrect. Pt with fleeting attention to PO intake and clinician focused on strategies and modifications to increase sustained attention to meal intake and reduce need for caregiver assist. Prior to modifications, pt with difficulty sustaining and alternating attention, requiring mod verbal cues and redirections. Eliminated as many visual and auditory distractions as possible including: turning TV off, closing door and removing everything from tray table except for plate and drink. Placed table in front of pt, removed hand mitts and encouraged pt to self-feed. Sustained attention improved with pt actively consuming meal, only requiring min verbal/visual cueing to alternate attention when attempting to switch between bites and sips. NT who has been assisting with prior meals, reported significant improvement with attention during meal time with modifications/strategies this date. She reported that she will continue to implement them at various mealtimes to determine carryover. Suspect mentation will continue to fluctuate from day to day, so strategies/modifications/cueing may need to be adjusted accordingly. SLP to f/u for carry over and continued treatment of cognitive functions    HPI HPI: Pt is a 56 y.o. male admitted from group home (substance abuse rehab?) on 01/05/21 with witnessed seizure, AMS. 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 EEG unremarkable. LP on  12/21 showed staph in CSF, felt to be contaminant. Workup for AKI, rhabdomyolysis. Dx Uremic encephalopathy. Course complicated by c/o L foot pain; imaging negative for acute injury. Pt remains hospitalized due to difficulty with placement. SLP evaluation on 01/26/21: 9/27 on Mini Mental State Exam, SLUMS 02/03/21: 2/30; SLUMS 1/27: 1/30. Psych consulted and as of 1/16, pt did not meet criteria for inpatient psychiatric admission.      SLP Plan  Continue with current plan of care      Recommendations for follow up therapy are one component of a multi-disciplinary discharge planning process, led by the attending physician.  Recommendations may be updated based on patient status, additional functional criteria and insurance authorization.    Recommendations                   Follow Up Recommendations: Skilled nursing-short term rehab (<3 hours/day) Assistance recommended at discharge: Frequent or constant Supervision/Assistance SLP Visit Diagnosis: Cognitive communication deficit (R41.841);Attention and concentration deficit Attention and concentration deficit following: Other cerebrovascular disease Plan: Continue with current plan of care         Avie Echevaria, MA, CCC-SLP Acute Rehabilitation Services Office Number: [phone]   Paulette Blanch  03/10/2021, 1:55 PM

## 2021-03-10 NOTE — Consult Note (Signed)
°  Psych consult placed by Dr. Jerral Ralph, for prolonged hospitalization currently with a fluctuating delirium/impulsivity has been started on multiple sedating/polypharmacy please assist in medication management.  Chart review shows patient is a 56 year old male with history of major depression, anxiety who was in a residential EtOH treatment center for the last 5 months.  He was brought to the ED on December 20 from a group home for convalescence, combativeness, and altered sensorium.  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  Current psychotropic medications consist of BuSpar 10 mg p.o. twice daily, Klonopin 0.5 mg p.o. 3 times daily, fluoxetine 40 mg p.o. daily, gabapentin 300 mg p.o. 3 times daily, hydroxyzine 25 mg p.o. nightly, olanzapine 2.5 mg p.o. every morning and olanzapine 7.5 mg p.o. nightly, and trazodone 100 mg p.o. nightly, and valproate acid 500 mg p.o. twice daily.  Patient has as needed medications include haloperidol 2 mg IM every 6 hours as needed for agitation, of note patient has received this x2 on February 17 and February 19.  Prior to admission patient's home medications were gabapentin 400 mg p.o. 3 times daily, hydroxyzine 50 mg p.o. every 4 hours as needed for anxiety, fluoxetine 40 mg, and trazodone 50 mg p.o. nightly as needed.  On today's reevaluation patient continues to present with ongoing confusion, disorientation, altered sensorium.  He is unable to identify the date, place, time, or whereabouts.  Nurse practitioner pointed to date on wall, in which patient read out March (March 10, 2021).  Patient is a poor historian, and unable to answer questions fully.  Consider psych consult placed for medication management will provide assistance at this time, as patient has previously been psychiatrically clear by our consult service.  Recommend -Reducing patient's olanzapine to 5 mg  p.o. nightly. -Reducing Klonopin 0.5mg  p.o. twice daily -Reducing trazodone 50 mg p.o. nightly as needed -Last valproic acid level checked on February 16, 55.  Dose was increased on February 21, recommend repeating level on Saturday 03/13/2021.  Thank you for consulting this service!  Patient will remain psychiatrically cleared at this time.  Please reconsult with any additional new concerns.

## 2021-03-10 NOTE — Plan of Care (Signed)
Pt has been more alert today. Soft waist belt is in place until patient can follow safety measures. Pt has been assisted with feeding. Pt has on a cond. Cath.  Problem: Education: Goal: Knowledge of General Education information will improve Description: Including pain rating scale, medication(s)/side effects and non-pharmacologic comfort measures Outcome: Progressing   Problem: Health Behavior/Discharge Planning: Goal: Ability to manage health-related needs will improve Outcome: Progressing   Problem: Clinical Measurements: Goal: Ability to maintain clinical measurements within normal limits will improve Outcome: Progressing Goal: Will remain free from infection Outcome: Progressing Goal: Diagnostic test results will improve Outcome: Progressing Goal: Respiratory complications will improve Outcome: Progressing Goal: Cardiovascular complication will be avoided Outcome: Progressing   Problem: Activity: Goal: Risk for activity intolerance will decrease Outcome: Progressing   Problem: Nutrition: Goal: Adequate nutrition will be maintained Outcome: Progressing   Problem: Coping: Goal: Level of anxiety will decrease Outcome: Progressing   Problem: Elimination: Goal: Will not experience complications related to bowel motility Outcome: Progressing Goal: Will not experience complications related to urinary retention Outcome: Progressing   Problem: Pain Managment: Goal: General experience of comfort will improve Outcome: Progressing   Problem: Safety: Goal: Ability to remain free from injury will improve Outcome: Progressing   Problem: Skin Integrity: Goal: Risk for impaired skin integrity will decrease Outcome: Progressing   Problem: Education: Goal: Expressions of having a comfortable level of knowledge regarding the disease process will increase Outcome: Progressing   Problem: Coping: Goal: Ability to adjust to condition or change in health will improve Outcome:  Progressing Goal: Ability to identify appropriate support needs will improve Outcome: Progressing   Problem: Health Behavior/Discharge Planning: Goal: Compliance with prescribed medication regimen will improve Outcome: Progressing   Problem: Medication: Goal: Risk for medication side effects will decrease Outcome: Progressing   Problem: Clinical Measurements: Goal: Complications related to the disease process, condition or treatment will be avoided or minimized Outcome: Progressing Goal: Diagnostic test results will improve Outcome: Progressing   Problem: Safety: Goal: Verbalization of understanding the information provided will improve Outcome: Progressing   Problem: Self-Concept: Goal: Level of anxiety will decrease Outcome: Progressing Goal: Ability to verbalize feelings about condition will improve Outcome: Progressing

## 2021-03-10 NOTE — Progress Notes (Signed)
Progress Note   Patient: Steve [name] FMB:[id number] DOB: December 21, 1965 DOA: 01/05/2021     64 DOS: the patient was seen and examined on 03/10/2021   Brief hospital course: Steve [name] is a 56 y.o. male with history of major depression, anxiety who was in a residential ETOH treatment center for last 5 months. He was brought to the ED on 12/20 from a group home for convalescence, combativeness and altered sensorium.  Per collateral history, patient was not acting typical for last few days.  In the ED, patient was confused, combative. Labs showed AKI with creatinine of 3.27, rhabdomyolysis, abnormal LFTs, elevated WBC count at 23.7 and lactic acid level at 7.6. 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 Blood culture was sent, empiric antibiotic started.  IV fluid resuscitation started and patient was initially admitted to ICU. 12/21, underwent LP which showed staph in CSF, felt to be contaminant.  EEG did not show any seizures 12/24, HD catheter was placed but patient did not require HD as creatinine started to improve 12/31, with clinical improvement, patient was transferred out of ICU to [facility]. 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 Also evidence of prior lacunar infarct right basal ganglia Cognitive screening revealed significant cognitive deficits Continue Prozac, BuSpar, trazodone-has known history of major depressive disorder-continue Zyprexa and Depakote for behavioral issues and cognitive impairment Follow QTC, platelets and LFTs periodically while on  Zyprexa and Depakote -Significant improvement in behaviors and impulsivity with the addition of Klonopin TID allowed patient to be removed from Posey enclosure device -Remains impulsive so Posey belt restraint in place. -2/21 very lethargic so held next dose of Klonopin dose and decreased future doses. Have also decreased neurontin, zyprexa and depakote doses. CXR neg, ABG c/w contraction alkalosis, UA unremarkable. Vistaril also decreased to 25 mg HS-as of 2/22 patient is much more alert. -Psychiatric consultation placed to assist with medication management -Poor intake and if FU BMET stil c/w dehydration so will give IVFs x 24 hrs    Witnessed seizure ([name]) No apparent prior history of seizure disorder noting patient was not on AEDs prior to admission Unclear if withdrawal seizure or related to other substances EEG unremarkable.   Continue Keppra at recommendation of neurology  Acute kidney injury with rhabdomyolysis Resolved Creatinine peaked at 7.48 on 12/26.  Nephrology consulted with initial plans to initiate dialysis including placement of [facility] but renal function gradually improved and dialysis not needed and TDC removed  Possible DVT (deep venous thrombosis) ([name]) Per critical care note, hemodialysis cath had a clot on it when it was removed.  UE duplex  12/31: no DVT.    Aspiration pneumonia ([name])-resolved as of 03/04/2021 Completed a course of Zosyn  Elevated transaminases at time of presentation-resolved as of 03/04/2021 Likely secondary to sepsis like physiology at presentation His AST and ALT were significantly elevated to over 700s on admission.  Re emergence mild elevation LFTs-likely due to Zyprexa and Depakote Due to oversedation Zyprexa dose decreased LFTs have normalized with decrease in Zyprexa dose. 2/16 valproic acid level 55   Right ventricular dysfunction- (present on admission) Echo 1/2 with  EF 60 to 65%.  Cannot rule out a small PFO this finding determined to  be clinically insignificant given the patient was asymptomatic  Physical deconditioning 2/2 ambulatory dysfunction- (present on admission) Influenced by patient's poor cognition as well as prior heavy alcohol abuse and malnutrition before admission Therapy continues to recommend SNF placement due to level of assist required with ADLs and IADLs as well as ongoing poor cognition Mobility continues to be influenced not only by cognition but by ambulatory dysfunction/gait and imbalance issues  Severe sepsis without septic shock ([place]) Ruled out Sepsis-like physiology secondary to presentation with profound dehydration, hypoperfusion from dehydration related hypotension seizure activity Chest work-up was negative  Protein calorie malnutrition ([place]) Patient having difficulty eating independently and completing meal We will have OT evaluate to determine if adaptive devices indicated Appears to have some visual disturbance as well contributing Increase protein shakes from twice daily to AC/HS        Subjective:  Much more awake today.  Even though he remains highly confused with word finding difficulty he was able to relate that he feels much more awake today.  Continues to require Posey belt restraint due to continued impulsivity  Physical Exam: Vitals:   03/09/21 2337 03/10/21 0423 03/10/21 0739 03/10/21 1131  BP: 98/82 104/86 114/85 100/77  Pulse: 98 96 94 (!) 105  Resp: 16 17 16 16   Temp: 98.1 F (36.7 C) 97.7 F (36.5 C) 98.1 F (36.7 C) 98.2 F (36.8 C)  TempSrc: Oral  Oral Oral  SpO2:   98%   Weight:      Height:       Constitutional: NAD, calm  Respiratory: Anterior lung sounds clear, Normal respiratory effort. RA Cardiovascular: Regular pulse, no peripheral edema.  Normotensive.  S1-S2 Abdomen: no tenderness, no masses palpated. Bowel sounds positive. LBM 2/2-continues to have difficulty eating independently Neurologic: CN 2-12 grossly intact. Sensation intact, Strength  3+-4/5 x all 4 extremities.  Psychiatric: Alert and oriented times name only.  Safety belt for impulsivity  Data Reviewed: There are no new results to review at this time.  Family Communication:  Patient only  Disposition: Status is: Inpatient Remains inpatient appropriate because:  Unsafe discharge plan secondary to severe cognitive impairment requiring SNF placement.  Currently lacks funding.     A physical therapy consult is indicated based on the patients mobility assessment.   Mobility Assessment (last 72 hours)     Mobility Assessment     Row Name 03/10/21 0400 03/10/21 0001 03/09/21 2000   Does patient have an order for bedrest or is patient medically unstable No - Continue assessment No - Continue assessment No - Continue assessment   What is the highest level of mobility based on the progressive mobility assessment? Level 5 (Walks with assist in room/hall) - Balance while stepping forward/back and can walk in room with assist - Complete Level 5 (Walks with assist in room/hall) - Balance while stepping forward/back and can walk in room with assist - Complete Level 5 (Walks with assist in room/hall) - Balance while stepping forward/back and can walk in room with assist - Complete    Row Name 03/09/21 1100 03/09/21 0600 03/09/21 0500   Does patient have an order for bedrest or is patient medically unstable -- No - Continue assessment No - Continue assessment   What is the highest level of mobility based on the progressive mobility assessment? Level 5 (Walks with assist in room/hall) - Balance while stepping forward/back and can walk in  room with assist - Complete Level 5 (Walks with assist in room/hall) - Balance while stepping forward/back and can walk in room with assist - Complete Level 5 (Walks with assist in room/hall) - Balance while stepping forward/back and can walk in room with assist - Complete   Is the above level different from baseline mobility prior to current  illness? -- Yes - Recommend PT order Yes - Recommend PT order    Row Name 03/09/21 0400 03/09/21 0316 03/09/21 0200   Does patient have an order for bedrest or is patient medically unstable No - Continue assessment No - Continue assessment No - Continue assessment   What is the highest level of mobility based on the progressive mobility assessment? Level 5 (Walks with assist in room/hall) - Balance while stepping forward/back and can walk in room with assist - Complete Level 5 (Walks with assist in room/hall) - Balance while stepping forward/back and can walk in room with assist - Complete Level 5 (Walks with assist in room/hall) - Balance while stepping forward/back and can walk in room with assist - Complete   Is the above level different from baseline mobility prior to current illness? Yes - Recommend PT order Yes - Recommend PT order Yes - Recommend PT order    [place] Name 03/09/21 0000       Does patient have an order for bedrest or is patient medically unstable No - Continue assessment     What is the highest level of mobility based on the progressive mobility assessment? Level 5 (Walks with assist in room/hall) - Balance while stepping forward/back and can walk in room with assist - Complete     Is the above level different from baseline mobility prior to current illness? Yes - Recommend PT order                 DVT Prophylaxis  .Rivaroxaban (xarelto) tablet 10 mg , Rivaroxaban (xarelto) tablet 10 mg   Planned Discharge Destination:  Skilled nursing facility  Medically stable: No-still titrating behavioral meds  COVID vaccination status:  Unknown  Consultants: Nephrology Psychiatry    Procedures: EEG Echocardiogram TDC   Antibiotics: Unasyn x1 dose, cefepime x1 dose, ceftriaxone x1 dose, vancomycin x2 doses     Time spent: 15 minutes  Author: Erin Hearing, NP 03/10/2021 12:21 PM  For on call review [email or web address]

## 2021-03-11 MED ORDER — THIAMINE HCL 100 MG PO TABS
250.0000 mg | ORAL_TABLET | Freq: Every day | ORAL | Status: DC
Start: 2021-03-11 — End: 2021-05-14
  Administered 2021-03-11 – 2021-05-14 (×65): 250 mg via ORAL
  Filled 2021-03-11 (×65): qty 3

## 2021-03-11 MED ORDER — OLANZAPINE 5 MG PO TBDP
2.5000 mg | ORAL_TABLET | Freq: Three times a day (TID) | ORAL | Status: DC | PRN
Start: 2021-03-11 — End: 2021-03-11
  Filled 2021-03-11: qty 0.5

## 2021-03-11 MED ORDER — MELATONIN 5 MG PO TABS
5.0000 mg | ORAL_TABLET | Freq: Every day | ORAL | Status: DC
  Administered 2021-03-11: 5 mg via ORAL
  Filled 2021-03-11: qty 1

## 2021-03-11 MED ORDER — GABAPENTIN 100 MG PO CAPS
100.0000 mg | ORAL_CAPSULE | Freq: Three times a day (TID) | ORAL | Status: DC
  Administered 2021-03-11 – 2021-03-16 (×16): 100 mg via ORAL
  Filled 2021-03-11 (×16): qty 1

## 2021-03-11 MED ORDER — OLANZAPINE 10 MG IM SOLR
2.5000 mg | Freq: Three times a day (TID) | INTRAMUSCULAR | Status: DC | PRN
  Filled 2021-03-11 (×2): qty 10

## 2021-03-11 MED ORDER — OLANZAPINE 5 MG PO TBDP
2.5000 mg | ORAL_TABLET | Freq: Three times a day (TID) | ORAL | Status: DC | PRN
Start: 2021-03-11 — End: 2021-03-20
  Administered 2021-03-13 – 2021-03-17 (×3): 2.5 mg via ORAL
  Filled 2021-03-11 (×5): qty 0.5

## 2021-03-11 MED ORDER — OLANZAPINE 2.5 MG PO TABS
7.5000 mg | ORAL_TABLET | Freq: Every day | ORAL | Status: DC
  Administered 2021-03-11 – 2021-03-14 (×4): 7.5 mg via ORAL
  Filled 2021-03-11 (×4): qty 3

## 2021-03-11 MED ORDER — MELATONIN 5 MG PO TABS: 10.0000 mg | ORAL_TABLET | Freq: Every day | ORAL | Status: DC

## 2021-03-11 NOTE — Progress Notes (Signed)
Progress Note   Patient: Steve [name] [id number] DOB: 06-Apr-1965 DOA: 01/05/2021     65 DOS: the patient was seen and examined on 03/11/2021   Brief hospital course: Lamonta Fradette is a 56 y.o. male with history of major depression, anxiety who was in a residential ETOH treatment center for last 5 months. He was brought to the ED on 12/20 from a residential ETOH treatment center for convalescence, combativeness and altered sensorium.  Per collateral history, patient was not acting typical for last few days.  In the ED, patient was confused, combative. Labs showed AKI with creatinine of 3.27, rhabdomyolysis, abnormal LFTs, elevated WBC count at 23.7 and lactic acid level at 7.6. 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 Blood culture was sent, empiric antibiotic started.  IV fluid resuscitation started and patient was initially admitted to ICU. 12/21, underwent LP which showed staph in CSF, felt to be contaminant.  EEG did not show any seizures 12/24, HD catheter was placed but patient did not require HD as creatinine started to improve 12/31, with clinical improvement, patient was transferred out of ICU to [facility]. 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 Also evidence of prior lacunar infarct right basal ganglia Cognitive screening revealed significant cognitive deficits Recent issues with difficult to control impulsivity-attempts with medication management have been difficult noting with increasing doses of medicine patient became very sedated and was  unable to feed Continue Prozac, DC BuSpar in favor of low-dose Klonopin Continue low-dose Zyprexa during the day as well as current dose of Zyprexa at night.  Have as needed p.o. or IM Zyprexa for intermittent agitation which typically occurs at night Begin melatonin 10 mg at night to help with restlessness and sundowning type behaviors. DC nocturnal Vistaril Decrease Neurontin to 100 mg with eventual plans to discontinue Continue current dose of Depakote with plans to likely taper and discontinue Appreciate psychiatry recommendation-after extensive discussion with attending physician Dr. Sloan Leiter plan was to adjust medications as above -Remains impulsive so Posey belt restraint in place. Resume thiamine-previously had been given high-dose IV thiamine earlier in the hospitalization with some improvement in cognition    Witnessed seizure ([place]) No apparent prior history of seizure disorder noting patient was not on AEDs prior to admission Unclear if withdrawal seizure or related to other substances EEG unremarkable.   Continue Keppra at recommendation of neurology  Acute kidney injury with rhabdomyolysis Resolved Creatinine peaked at 7.48 on 12/26.  Nephrology consulted with initial plans to initiate dialysis including placement of [facility] but renal function gradually improved and dialysis not needed and TDC removed  Possible DVT (deep venous thrombosis) ([place]) Per critical care note, hemodialysis cath had a clot on it when it was removed.  UE duplex  12/31: no DVT.    Aspiration pneumonia ([name])-resolved as of 03/04/2021 Completed a course of Zosyn  Elevated transaminases at time of presentation-resolved as of 03/04/2021 Likely secondary to sepsis like physiology at presentation His AST and ALT were significantly elevated to over 700s on admission.  Re emergence mild elevation LFTs-likely due to Zyprexa and Depakote Due to oversedation Zyprexa dose decreased  LFTs have normalized with decrease  in Zyprexa dose. 2/16 valproic acid level 55   Right ventricular dysfunction- (present on admission) Echo 1/2 with EF 60 to 65%.  Cannot rule out a small PFO this finding determined to be clinically insignificant given the patient was asymptomatic  Physical deconditioning 2/2 ambulatory dysfunction- (present on admission) Influenced by patient's poor cognition as well as prior heavy alcohol abuse and malnutrition before admission Therapy continues to recommend SNF placement due to level of assist required with ADLs and IADLs as well as ongoing poor cognition Mobility continues to be influenced not only by cognition but by ambulatory dysfunction/gait and imbalance issues 2/23 despite significant improvement in alertness ambulatory dysfunction has not improved so therefore not influenced by medication  Severe sepsis without septic shock ([place]) Ruled out Sepsis-like physiology secondary to presentation with profound dehydration, hypoperfusion from dehydration related hypotension seizure activity Chest work-up was negative  Protein calorie malnutrition ([place]) Patient having difficulty eating independently and completing meal We will have OT evaluate to determine if adaptive devices indicated Appears to have some visual disturbance as well contributing Increase protein shakes from twice daily to AC/HS        Subjective:  Much more alert today.  Sitting up in chair but remains quite impulsive and continues to require Posey belt restraint for safety.  OT at bedside stated patient was able to eat 100% of breakfast independently.  They are using coloring in an effort to see if they can help patient with his impulsivity and keep him focused on an activity.  Physical Exam: Vitals:   03/10/21 2022 03/10/21 2349 03/11/21 0416 03/11/21 0858  BP: 117/90 117/90 105/79 (!) 116/92  Pulse: 94 89 72 86  Resp: 16  18 20   Temp: 97.9 F (36.6 C) 97.7 F (36.5 C) 97.6 F (36.4 C) (!) 97.4 F (36.3  C)  TempSrc: Oral Oral  Oral  SpO2:    99%  Weight:      Height:       Constitutional: NAD, calm but remains impulsive Respiratory: Anterior lung sounds clear, Normal respiratory effort. RA Cardiovascular: Regular pulse, no peripheral edema.  Normotensive.  S1-S2 Abdomen: no tenderness, no masses palpated. Bowel sounds positive. LBM 2/212/2-continues to have difficulty eating independently Neurologic: CN 2-12 grossly intact. Sensation intact, Strength 3+-4/5 x all 4 extremities.  Psychiatric: Alert and oriented times name only.  Safety belt for impulsivity  Data Reviewed: There are no new results to review at this time.  Family Communication:  Patient only  Disposition: Status is: Inpatient Remains inpatient appropriate because:  Unsafe discharge plan secondary to severe cognitive impairment requiring SNF placement.  Currently lacks funding.     A physical therapy consult is indicated based on the patients mobility assessment.   Mobility Assessment (last 72 hours)     Mobility Assessment     Row Name 03/11/21 0800 03/10/21 0400 03/10/21 0001   Does patient have an order for bedrest or is patient medically unstable -- No - Continue assessment No - Continue assessment   What is the highest level of mobility based on the progressive mobility assessment? Level 3 (Stands with assist) - Balance while standing  and cannot march in place Level 5 (Walks with assist in room/hall) - Balance while stepping forward/back and can walk in room with assist - Complete Level 5 (Walks with assist in room/hall) - Balance while stepping forward/back and can walk in room with assist - Complete    Row Name 03/09/21 2000  03/09/21 1100 03/09/21 0600   Does patient have an order for bedrest or is patient medically unstable No - Continue assessment -- No - Continue assessment   What is the highest level of mobility based on the progressive mobility assessment? Level 5 (Walks with assist in room/hall) -  Balance while stepping forward/back and can walk in room with assist - Complete Level 5 (Walks with assist in room/hall) - Balance while stepping forward/back and can walk in room with assist - Complete Level 5 (Walks with assist in room/hall) - Balance while stepping forward/back and can walk in room with assist - Complete   Is the above level different from baseline mobility prior to current illness? -- -- Yes - Recommend PT order    Row Name 03/09/21 0500 03/09/21 0400 03/09/21 0316   Does patient have an order for bedrest or is patient medically unstable No - Continue assessment No - Continue assessment No - Continue assessment   What is the highest level of mobility based on the progressive mobility assessment? Level 5 (Walks with assist in room/hall) - Balance while stepping forward/back and can walk in room with assist - Complete Level 5 (Walks with assist in room/hall) - Balance while stepping forward/back and can walk in room with assist - Complete Level 5 (Walks with assist in room/hall) - Balance while stepping forward/back and can walk in room with assist - Complete   Is the above level different from baseline mobility prior to current illness? Yes - Recommend PT order Yes - Recommend PT order Yes - Recommend PT order    [place] Name 03/09/21 0200 03/09/21 0000     Does patient have an order for bedrest or is patient medically unstable No - Continue assessment No - Continue assessment    What is the highest level of mobility based on the progressive mobility assessment? Level 5 (Walks with assist in room/hall) - Balance while stepping forward/back and can walk in room with assist - Complete Level 5 (Walks with assist in room/hall) - Balance while stepping forward/back and can walk in room with assist - Complete    Is the above level different from baseline mobility prior to current illness? Yes - Recommend PT order Yes - Recommend PT order                DVT Prophylaxis  .Rivaroxaban  (xarelto) tablet 10 mg , Rivaroxaban (xarelto) tablet 10 mg   Planned Discharge Destination:  Skilled nursing facility  Medically stable: No-still titrating behavioral meds  COVID vaccination status:  Unknown  Consultants: Nephrology Psychiatry    Procedures: EEG Echocardiogram TDC   Antibiotics: Unasyn x1 dose, cefepime x1 dose, ceftriaxone x1 dose, vancomycin x2 doses     Time spent: 15 minutes  Author: Erin Hearing, NP 03/11/2021 10:08 AM  For on call review [email or web address].

## 2021-03-11 NOTE — Progress Notes (Signed)
Occupational Therapy Treatment Patient Details Name: Steve [name] MRN: [id number] DOB: 07-04-1965 Today's Date: 03/11/2021   History of present illness Pt is a 56 y.o. male admitted from group home for convalescence, combativeness and altered sensorium on 01/05/21 with witnessed seizure, AMS. 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 EEG unremarkable. LP on 12/21 showed staph in CSF, felt to be contaminant. Workup for AKI, rhabdomyolysis. Course complicated by c/o L foot pain; imaging negative for acute injury. Pt remains hospitalized due to difficult to place. Pt with psych consult who felt no need for inpt psych. No PMH in chart.   OT comments  Goals updated this session, OT spent significant time with patient, he required mod A for bed mobility today. Min A to come sit in recliner with cues for sequencing during transfer. Then we sat in recliner, Pt was hand over hand to initiate grooming tasks in preparation for self-feeding. OT prepared meal tray and environment by eliminating other distractions, limiting to one food item on tray at a time and one drink. Pancakes and sausage cut up for Pt. Pt was able to self-feed demonstrating appropriate grasp on utensils. He does take too big of bites and required towel on his front for dropped food (unaware). He ate his entire tray, drank ensure, a milk, 2 OJ, and a cup of water. Then attempted to assess vision. Pt not following directions enough to fully assess, but he seems to be seeing less in his superior fields of vision, and requires head turns for items on the right. He was unable to read at all today. OT provided him an adult coloring page and he attended for approx 30 seconds. OT will continue to follow acutely.    Recommendations for follow up therapy are one component of a multi-disciplinary discharge planning process, led by the  attending physician.  Recommendations may be updated based on patient status, additional functional criteria and insurance authorization.    Follow Up Recommendations  Skilled nursing-short term rehab (<3 hours/day)    Assistance Recommended at Discharge Frequent or constant Supervision/Assistance  Patient can return home with the following  A little help with walking and/or transfers;A little help with bathing/dressing/bathroom;Assistance with cooking/housework;Assist for transportation;Help with stairs or ramp for entrance   Equipment Recommendations  None recommended by OT    Recommendations for Other Services      Precautions / Restrictions Precautions Precautions: Fall Restrictions Weight Bearing Restrictions: No       Mobility Bed Mobility Overal bed mobility: Needs Assistance Bed Mobility: Supine to Sit     Supine to sit: Mod assist     General bed mobility comments: mod A to initiate BLE to EOB, and trunk elevation (Pt reaching out for assist) Pt initially resistant, but then agreeable    Transfers Overall transfer level: Needs assistance Equipment used: Rolling walker (2 wheels) Transfers: Sit to/from Stand, Bed to chair/wheelchair/BSC Sit to Stand: Min assist     Step pivot transfers: Min assist (cues for sequencing initially)     General transfer comment: Min A for safety due to R lean and ataxic movement, cues for hand placement as well.     Balance Overall balance assessment: Needs assistance Sitting-balance support: Feet supported Sitting balance-Leahy Scale: Good     Standing balance support: Bilateral upper extremity supported, Reliant on assistive device for balance Standing balance-Leahy Scale: Poor Standing balance comment: UE support and  at times min assist for dynamic                           ADL either performed or assessed with clinical judgement   ADL Overall ADL's : Needs assistance/impaired Eating/Feeding: Minimal  assistance;Sitting Eating/Feeding Details (indicate cue type and reason): environment prepped with minimal distractions and only one food item/drink in front of him at a time. Pt required all food to be cut up, continaers opened for him. Pt able to self-feed after that with cues for attention (although very motivated by eating/drinking) Pt did require a towel to be used as a drop cloth Grooming: Wash/dry hands;Wash/dry face;Sitting;Minimal assistance;Cueing for sequencing Grooming Details (indicate cue type and reason): Pt requires hand over hand to initiate task             Lower Body Dressing: Min guard;Sitting/lateral leans Lower Body Dressing Details (indicate cue type and reason): donned socks Toilet Transfer: Minimal assistance;Stand-pivot Toilet Transfer Details (indicate cue type and reason): simulated with recliner transfer         Functional mobility during ADLs: Minimal assistance;Rolling walker (2 wheels)      Extremity/Trunk Assessment Upper Extremity Assessment Upper Extremity Assessment: Overall WFL for tasks assessed (no problem holding onto utensils)   Lower Extremity Assessment Lower Extremity Assessment: Defer to PT evaluation        Vision       Perception     Praxis      Cognition Arousal/Alertness: Awake/alert Behavior During Therapy: Impulsive Overall Cognitive Status: Impaired/Different from baseline Area of Impairment: Attention, Following commands, Awareness, Safety/judgement, Problem solving, Orientation, Memory                 Orientation Level: Disoriented to, Situation Current Attention Level: Sustained Memory: Decreased short-term memory Following Commands: Follows one step commands consistently Safety/Judgement: Decreased awareness of safety, Decreased awareness of deficits Awareness: Intellectual Problem Solving: Requires verbal cues, Requires tactile cues, Slow processing General Comments: Pt with attention sustained to  actoivity at hand - focused on self-feeding for approx 10 min with min redirection, Pt is tangential but more interactive OF NOTE: Pt seeing people in room that are not there        Exercises      Shoulder Instructions       General Comments      Pertinent Vitals/ Pain       Pain Assessment Pain Assessment: No/denies pain Pain Intervention(s): Monitored during session  Home Living Family/patient expects to be discharged to:: Skilled nursing facility                                        Prior Functioning/Environment              Frequency  Min 1X/week        Progress Toward Goals  OT Goals(current goals can now be found in the care plan section)  Progress towards OT goals: Progressing toward goals  Acute Rehab OT Goals Patient Stated Goal: eat all my food OT Goal Formulation: With patient Time For Goal Achievement: 03/25/21 Potential to Achieve Goals: Fair ADL Goals Pt Will Perform Grooming: with supervision;standing Pt Will Perform Lower Body Bathing: with supervision;sitting/lateral leans Pt Will Perform Lower Body Dressing: with supervision;sit to/from stand Pt Will Transfer to Toilet: with supervision;ambulating Pt Will Perform Toileting - Clothing Manipulation and hygiene: with  modified independence;sitting/lateral leans Additional ADL Goal #1: Pt will demonstrate increased attention to task, requiring less than 3 vc during a 5 min ADL task  Plan Discharge plan remains appropriate    Co-evaluation                 AM-PAC OT "6 Clicks" Daily Activity     Outcome Measure   Help from another person eating meals?: A Little Help from another person taking care of personal grooming?: A Little Help from another person toileting, which includes using toliet, bedpan, or urinal?: A Lot Help from another person bathing (including washing, rinsing, drying)?: A Lot Help from another person to put on and taking off regular upper body  clothing?: A Lot Help from another person to put on and taking off regular lower body clothing?: A Little 6 Click Score: 15    End of Session Equipment Utilized During Treatment: Gait belt;Rolling walker (2 wheels)  OT Visit Diagnosis: Unsteadiness on feet (R26.81);Muscle weakness (generalized) (M62.81);Low vision, both eyes (H54.2);Ataxia, unspecified (R27.0)   Activity Tolerance Patient tolerated treatment well   Patient Left in chair;with chair alarm set;with restraints reapplied (posey belt but no mitts - RN and NT aware)   Nurse Communication Mobility status (pulled off condom cath)        Time: 7628-3151 OT Time Calculation (min): 70 min  Charges: OT General Charges $OT Visit: 1 Visit OT Treatments $Self Care/Home Management : 38-52 mins $Therapeutic Activity: 23-37 mins  Nyoka Cowden OTR/L Acute Rehabilitation Services Pager: [phone] Office: [phone]  Evern Bio Piera Downs 03/11/2021, 9:53 AM

## 2021-03-11 NOTE — Plan of Care (Signed)
  Problem: Clinical Measurements: Goal: Will remain free from infection Outcome: Progressing Goal: Diagnostic test results will improve Outcome: Progressing Goal: Respiratory complications will improve Outcome: Progressing Goal: Cardiovascular complication will be avoided Outcome: Progressing   

## 2021-03-11 NOTE — Progress Notes (Signed)
HOSPITAL MEDICINE OVERNIGHT EVENT NOTE    Nursing reports the patient continues to exhibit bouts of agitation, regularly pulling at medical hardware and frequently placing himself at risk of falling out of bed.  Administration of as needed Haldol limited due to patient not being on telemetry.  Patient is currently receiving several other mood altering agents per psychiatry recommendations.  We will renew soft weight belt restraint order, continue to monitor closely.  Marinda Elk  MD [facility]

## 2021-03-11 NOTE — Progress Notes (Signed)
Physical Therapy Treatment Patient Details Name: Steve [name] MRN: [id number] DOB: [birth year] Today's Date: 03/11/2021   History of Present Illness Pt is a 56 y.o. male admitted from group home for convalescence, combativeness and altered sensorium on 01/05/21 with witnessed seizure, AMS. 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 EEG unremarkable. LP on 12/21 showed staph in CSF, felt to be contaminant. Workup for AKI, rhabdomyolysis. Course complicated by c/o L foot pain; imaging negative for acute injury. Pt remains hospitalized due to difficult to place. Pt with psych consult who felt no need for inpt psych. No PMH in chart.    PT Comments    Pt received in recliner, restless and attempting to stand with posey belt in place. Pt performed sit to stand with RW multiple trials, then SPT with RW recliner to bed. Pt required min assist transfers, and min assist bed mobility. Pt with decreased safety awareness and poor command follow. Pt labile and hallucinating during session, seeing his brother in room that wasn't there. Difficult to progress mobility due to pt's cognitive impairments. Pt supine in bed with posey belt in place at end of session.    Recommendations for follow up therapy are one component of a multi-disciplinary discharge planning process, led by the attending physician.  Recommendations may be updated based on patient status, additional functional criteria and insurance authorization.  Follow Up Recommendations  Skilled nursing-short term rehab (<3 hours/day)     Assistance Recommended at Discharge Frequent or constant Supervision/Assistance  Patient can return home with the following A little help with walking and/or transfers;Assistance with cooking/housework;A little help with bathing/dressing/bathroom;Direct supervision/assist for financial  management;Direct supervision/assist for medications management;Assist for transportation   Equipment Recommendations  Rolling walker (2 wheels)    Recommendations for Other Services       Precautions / Restrictions Precautions Precautions: Fall Precaution Comments: posey belt in bed and recliner, electronic safety sitter Restrictions Weight Bearing Restrictions: No     Mobility  Bed Mobility Overal bed mobility: Needs Assistance Bed Mobility: Sit to Supine       Sit to supine: Min assist   General bed mobility comments: repeated cues for sequencing, assist to initiate    Transfers Overall transfer level: Needs assistance Equipment used: Rolling walker (2 wheels) Transfers: Sit to/from Stand, Bed to chair/wheelchair/BSC Sit to Stand: Min assist   Step pivot transfers: Min assist       General transfer comment: assist to maintain balance, continual cues for sequencing    Ambulation/Gait                   Stairs             Wheelchair Mobility    Modified Rankin (Stroke Patients Only)       Balance Overall balance assessment: Needs assistance Sitting-balance support: Feet supported, No upper extremity supported Sitting balance-Leahy Scale: Good     Standing balance support: Bilateral upper extremity supported, Reliant on assistive device for balance Standing balance-Leahy Scale: Poor                              Cognition Arousal/Alertness: Awake/alert Behavior During Therapy: Restless, Impulsive Overall Cognitive Status: Impaired/Different from baseline Area of Impairment: Attention, Following commands, Awareness, Safety/judgement, Problem solving, Orientation, Memory  Orientation Level: Disoriented to, Place, Time, Situation Current Attention Level: Sustained Memory: Decreased short-term memory Following Commands: Follows one step commands consistently, Follows one step commands with increased  time Safety/Judgement: Decreased awareness of safety, Decreased awareness of deficits Awareness: Intellectual Problem Solving: Requires verbal cues, Requires tactile cues, Slow processing, Difficulty sequencing General Comments: Pt labile during session, crying then inappropriately laughing. Multiple episodes of hallucinations. Pt seeing his brother in the room but no one there.        Exercises      General Comments        Pertinent Vitals/Pain Pain Assessment Pain Assessment: No/denies pain    Home Living Family/patient expects to be discharged to:: Skilled nursing facility                        Prior Function            PT Goals (current goals can now be found in the care plan section) Acute Rehab PT Goals Patient Stated Goal: None stated Progress towards PT goals: Progressing toward goals    Frequency    Min 2X/week      PT Plan Current plan remains appropriate    Co-evaluation              AM-PAC PT "6 Clicks" Mobility   Outcome Measure  Help needed turning from your back to your side while in a flat bed without using bedrails?: None Help needed moving from lying on your back to sitting on the side of a flat bed without using bedrails?: A Little Help needed moving to and from a bed to a chair (including a wheelchair)?: A Little Help needed standing up from a chair using your arms (e.g., wheelchair or bedside chair)?: A Little Help needed to walk in hospital room?: Total Help needed climbing 3-5 steps with a railing? : Total 6 Click Score: 15    End of Session Equipment Utilized During Treatment: Gait belt Activity Tolerance: Other (comment) (limited by poor cognition (safety, command follow, impulsivity)) Patient left: in bed;with call bell/phone within reach;with bed alarm set;with restraints reapplied Nurse Communication: Mobility status PT Visit Diagnosis: Other abnormalities of gait and mobility (R26.89);Other symptoms and signs  involving the nervous system (R29.898)     Time: 1005-1019 PT Time Calculation (min) (ACUTE ONLY): 14 min  Charges:  $Therapeutic Activity: 8-22 mins                     Aida Raider, PT  Office # [phone] Pager [phone]    Ilda Foil 03/11/2021, 11:19 AM

## 2021-03-12 MED ORDER — MELATONIN 5 MG PO TABS
10.0000 mg | ORAL_TABLET | Freq: Every day | ORAL | Status: DC
Start: 2021-03-12 — End: 2021-04-28
  Administered 2021-03-12 – 2021-04-27 (×47): 10 mg via ORAL
  Filled 2021-03-12 (×47): qty 2

## 2021-03-12 NOTE — Plan of Care (Signed)
°  Problem: Nutrition: Goal: Adequate nutrition will be maintained Outcome: Progressing   Problem: Safety: Goal: Ability to remain free from injury will improve Outcome: Progressing   Problem: Self-Concept: Goal: Ability to verbalize feelings about condition will improve Outcome: Progressing

## 2021-03-12 NOTE — Progress Notes (Signed)
Pt has been restless throughout the day.  Pt constantly attempting to get OOB or out of chair with safety waist belt on.   No sitter available to stay with pt.  Have attempted to have pt up in chair and near the door/ ate lunch up in chair, then pt got up out of chair with waist belt on.   Attempted to redirect pt numerous times, last only few minutes then pt back at attempting to get up OOB or OOChair.   Pt very unsteady on his feet, even with staff holding onto him.   Pt has had numerous BMs today, had 2 last night on night/early morning shift and several on day shift.  Stools are soft/mushy (not diarrhea).  Pt was not given miralax yesterday or today due to having BMs without the need for laxative, colace held today as well, since stools are soft.

## 2021-03-12 NOTE — Progress Notes (Signed)
Progress Note   Patient: Steve [name] [id number] DOB: [birth year] DOA: 01/05/2021     66 DOS: the patient was seen and examined on 03/12/2021   Brief hospital course: Steve [name] is a 56 y.o. male with history of major depression, anxiety who was in a residential ETOH treatment center for last 5 months. He was brought to the ED on 12/20 from a residential ETOH treatment center for convalescence, combativeness and altered sensorium.  Per collateral history, patient was not acting typical for last few days.  In the ED, patient was confused, combative. Labs showed AKI with creatinine of 3.27, rhabdomyolysis, abnormal LFTs, elevated WBC count at 23.7 and lactic acid level at 7.6. 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 Blood culture was sent, empiric antibiotic started.  IV fluid resuscitation started and patient was initially admitted to ICU. 12/21, underwent LP which showed staph in CSF, felt to be contaminant.  EEG did not show any seizures 12/24, HD catheter was placed but patient did not require HD as creatinine started to improve 12/31, with clinical improvement, patient was transferred out of ICU to [facility]. 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 Also evidence of prior lacunar infarct right basal ganglia Cognitive screening revealed significant cognitive deficits Recent issues with difficult to control impulsivity-attempts with medication management have been difficult noting with increasing doses of medicine patient became very sedated and was  unable to feed Continue Prozac, DC BuSpar in favor of low-dose Klonopin Continue low-dose Zyprexa during the day as well as current dose of Zyprexa at night.  Have as needed p.o. or IM Zyprexa for intermittent agitation which typically occurs at night Begin melatonin 10 mg at night to help with restlessness and sundowning type behaviors. DC nocturnal Vistaril Decrease Neurontin to 100 mg with eventual plans to discontinue Continue current dose of Depakote with plans to likely taper and discontinue Appreciate psychiatry recommendation-after extensive discussion with attending physician Dr. Sloan Leiter plan was to adjust medications as above -Remains impulsive so Posey belt restraint in place. Resume thiamine-previously had been given high-dose IV thiamine earlier in the hospitalization with some improvement in cognition 2/24 rested better overnight.  No plans to make any medication adjustments.  Discussed with attending physician and plan is to obtain sitter preferably during the daytime hours and trial patient without Posey belt restraint to see how he does.  Primary limiting ability regarding removal of belt restraint is that patient has poor cognition but also has significant gait and ambulatory dysfunction that would increase risk for falls if not accompanied while ambulating. In addition to having sitter when sitter not available plan is to have patient utilize Posey belt while sitting in recliner chair and push patient into doorway so he can have increased socialization.  Staff of also been instructed to continue to utilize other methods of distraction such as coloring and activity vest.    Witnessed seizure ([place]) No apparent prior history of seizure disorder noting patient was not on AEDs prior to admission Unclear if withdrawal seizure or related to other substances EEG unremarkable.   Continue Keppra at recommendation of neurology  Acute kidney injury with  rhabdomyolysis Resolved Creatinine peaked at 7.48 on 12/26.  Nephrology consulted with initial plans to initiate dialysis including placement of [facility] but renal function gradually improved and dialysis not needed and TDC removed  Possible DVT (deep venous thrombosis) ([place]) Per critical care note, hemodialysis cath had a clot on it when it was removed.  UE duplex  12/31: no DVT.    Aspiration pneumonia ([name])-resolved as of 03/04/2021 Completed a course of Zosyn  Elevated transaminases at time of presentation-resolved as of 03/04/2021 Likely secondary to sepsis like physiology at presentation His AST and ALT were significantly elevated to over 700s on admission.  Re emergence mild elevation LFTs-likely due to Zyprexa and Depakote Due to oversedation Zyprexa dose decreased LFTs have normalized with decrease in Zyprexa dose. 2/16 valproic acid level 55   Right ventricular dysfunction- (present on admission) Echo 1/2 with EF 60 to 65%.  Cannot rule out a small PFO this finding determined to be clinically insignificant given the patient was asymptomatic  Physical deconditioning 2/2 ambulatory dysfunction- (present on admission) Influenced by patient's poor cognition as well as prior heavy alcohol abuse and malnutrition before admission Therapy continues to recommend SNF placement due to level of assist required with ADLs and IADLs as well as ongoing poor cognition Mobility continues to be influenced not only by cognition but by ambulatory dysfunction/gait and imbalance issues 2/23 despite significant improvement in alertness ambulatory dysfunction has not improved so therefore not influenced by medication Mobility Assessment (most recent)     Mobility Assessment - 03/11/21 2000     Does patient have an order for bedrest or is patient medically unstable No - Continue assessment  What is the highest level of mobility based on the progressive mobility assessment? Level 3 (Stands with assist) -  Balance while standing  and cannot march in place    Is the above level different from baseline mobility prior to current illness? Yes - Recommend PT order               Severe sepsis without septic shock ([place]) Ruled out Sepsis-like physiology secondary to presentation with profound dehydration, hypoperfusion from dehydration related hypotension seizure activity Chest work-up was negative  Protein calorie malnutrition ([place]) Patient having difficulty eating independently and completing meal We will have OT evaluate to determine if adaptive devices indicated Appears to have some visual disturbance as well contributing Increase protein shakes from twice daily to AC/HS        Subjective:  Patient pleasant and alert but remains restless, impulsive and highly confused.  Physical Exam: Vitals:   03/11/21 2326 03/12/21 0356 03/12/21 0815 03/12/21 1139  BP: 114/81 104/71 104/80 108/84  Pulse: 97 83 77 84  Resp: 17 17 18 20   Temp: 98.2 F (36.8 C) (!) 97.5 F (36.4 C) 98.5 F (36.9 C) 98.7 F (37.1 C)  TempSrc: Oral  Oral Oral  SpO2:   100% 98%  Weight:      Height:       Constitutional: NAD, calm but remains impulsive Respiratory: Anterior lung sounds clear, Normal respiratory effort. RA Cardiovascular: Regular pulse, no peripheral edema.  Normotensive.  S1-S2 Abdomen: no tenderness, no masses palpated. Bowel sounds positive. LBM 2/24-upon my exam patient trying to get out of bed because he had been incontinent of stool Neurologic: CN 2-12 grossly intact. Sensation intact, Strength 3+-4/5 x all 4 extremities.  Psychiatric: Alert and oriented times name only.  Safety belt for impulsivity  Data Reviewed: There are  no new results to review at this time.  Family Communication:  Patient only  Disposition: Status is: Inpatient Remains inpatient appropriate because:  Unsafe discharge plan secondary to severe cognitive impairment requiring SNF placement.  Currently lacks  funding.     A physical therapy consult is indicated based on the patients mobility assessment.   Mobility Assessment (most recent)     Mobility Assessment - 03/11/21 2000     Does patient have an order for bedrest or is patient medically unstable No - Continue assessment  What is the highest level of mobility based on the progressive mobility assessment? Level 3 (Stands with assist) - Balance while standing  and cannot march in place    Is the above level different from baseline mobility prior to current illness? Yes - Recommend PT order                  DVT Prophylaxis  .Rivaroxaban (xarelto) tablet 10 mg , Rivaroxaban (xarelto) tablet 10 mg   Planned Discharge Destination:  Skilled nursing facility  Medically stable: No-still titrating behavioral meds  COVID vaccination status:  Unknown  Consultants: Nephrology Psychiatry    Procedures: EEG Echocardiogram TDC   Antibiotics: Unasyn x1 dose, cefepime x1 dose, ceftriaxone x1 dose, vancomycin x2 doses     Time spent: 15 minutes  Author: Erin Hearing, NP 03/12/2021 12:19 PM  For on call review [email or web address].

## 2021-03-13 LAB — COMPREHENSIVE METABOLIC PANEL
ALT: 24 U/L (ref 0–44)
AST: 27 U/L (ref 15–41)
Albumin: 3.5 g/dL (ref 3.5–5.0)
Alkaline Phosphatase: 42 U/L (ref 38–126)
Anion gap: 9 (ref 5–15)
BUN: 16 mg/dL (ref 6–20)
CO2: 25 mmol/L (ref 22–32)
Calcium: 9 mg/dL (ref 8.9–10.3)
Chloride: 103 mmol/L (ref 98–111)
Creatinine, Ser: 0.51 mg/dL — ABNORMAL LOW (ref 0.61–1.24)
GFR, Estimated: >60 mL/min (ref >60)
Glucose, Bld: 98 mg/dL (ref 70–99)
Potassium: 3.3 mmol/L — ABNORMAL LOW (ref 3.5–5.1)
Sodium: 137 mmol/L (ref 135–145)
Total Bilirubin: 0.4 mg/dL (ref 0.3–1.2)
Total Protein: 6.8 g/dL (ref 6.5–8.1)

## 2021-03-13 LAB — URINE CULTURE: Culture: 50000 — AB

## 2021-03-13 LAB — CBC WITH DIFFERENTIAL/PLATELET
Abs Immature Granulocytes: 0.01 10*3/uL (ref 0.00–0.07)
Basophils Absolute: 0 10*3/uL (ref 0.0–0.1)
Basophils Relative: 1 %
Eosinophils Absolute: 0.1 10*3/uL (ref 0.0–0.5)
Eosinophils Relative: 2 %
HCT: 33.7 % — ABNORMAL LOW (ref 39.0–52.0)
Hemoglobin: 12.2 g/dL — ABNORMAL LOW (ref 13.0–17.0)
Immature Granulocytes: 0 %
Lymphocytes Relative: 46 %
Lymphs Abs: 2.2 10*3/uL (ref 0.7–4.0)
MCH: 33.2 pg (ref 26.0–34.0)
MCHC: 36.2 g/dL — ABNORMAL HIGH (ref 30.0–36.0)
MCV: 91.6 fL (ref 80.0–100.0)
Monocytes Absolute: 0.6 10*3/uL (ref 0.1–1.0)
Monocytes Relative: 12 %
Neutro Abs: 1.9 10*3/uL (ref 1.7–7.7)
Neutrophils Relative %: 39 %
Platelets: 210 10*3/uL (ref 150–400)
RBC: 3.68 MIL/uL — ABNORMAL LOW (ref 4.22–5.81)
RDW: 12.8 % (ref 11.5–15.5)
WBC: 4.9 10*3/uL (ref 4.0–10.5)
nRBC: 0 % (ref 0.0–0.2)

## 2021-03-13 LAB — MAGNESIUM: Magnesium: 1.9 mg/dL (ref 1.7–2.4)

## 2021-03-13 LAB — VALPROIC ACID LEVEL: Valproic Acid Lvl: 32 ug/mL — ABNORMAL LOW (ref 50.0–100.0)

## 2021-03-13 MED ORDER — VALPROIC ACID 250 MG PO CAPS
750.0000 mg | ORAL_CAPSULE | Freq: Two times a day (BID) | ORAL | Status: DC
  Administered 2021-03-13 – 2021-03-16 (×7): 750 mg via ORAL
  Filled 2021-03-13 (×8): qty 3

## 2021-03-13 MED ORDER — POTASSIUM CHLORIDE CRYS ER 20 MEQ PO TBCR
40.0000 meq | EXTENDED_RELEASE_TABLET | Freq: Once | ORAL | Status: AC
  Administered 2021-03-13: 40 meq via ORAL
  Filled 2021-03-13: qty 2

## 2021-03-13 NOTE — Progress Notes (Signed)
Patient rested well overnight, but continues to be restless while awake as he pulls at medical equipment.  Able to assist the patient to the [facility] to have BM.  Patient continues to experience severe tremors while standing, making it very difficult to keep him from falling.

## 2021-03-13 NOTE — Progress Notes (Signed)
Progress Note   Patient: Steve [name] ZOX:[id number] DOB: November 18, 1965 DOA: 01/05/2021     67 DOS: the patient was seen and examined on 03/13/2021   Brief hospital course: Steve [name] is a 56 y.o. male with history of major depression, anxiety who was in a residential ETOH treatment center for last 5 months. He was brought to the ED on 12/20 from a residential ETOH treatment center for convalescence, combativeness and altered sensorium.  Per collateral history, patient was not acting typical for last few days.  In the ED, patient was confused, combative. Labs showed AKI with creatinine of 3.27, rhabdomyolysis, abnormal LFTs, elevated WBC count at 23.7 and lactic acid level at 7.6. 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 Blood culture was sent, empiric antibiotic started.  IV fluid resuscitation started and patient was initially admitted to ICU. 12/21, underwent LP which showed staph in CSF, felt to be contaminant.  EEG did not show any seizures 12/24, HD catheter was placed but patient did not require HD as creatinine started to improve 12/31, with clinical improvement, patient was transferred out of ICU to [facility]. 1/18 Cognitive evaluations: 2 on the SLUMS, a 9 on the MMSE, and a 0 on medi-cog and short blessed test   Subjective:  In bed, not in distress, no headache, no chest pain, no Abd pain, no SOB.   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 Also evidence of prior lacunar infarct right basal ganglia, was seen by Neuro. seen by both neurology and psychiatry.  Had been extremely sedated earlier in the week of 03/10/2021.  Psych and neurology were  reconsulted.  Multiple medication changes were made.  Mentation has improved.  Continue present combination of trazodone, valproic acid, Zyprexa, Keppra, Prozac and Neurontin.  Will gently taper off sedative medications as needed.  Valproic acid noted to be low on 03/13/2021 pharmacy consulted to adjust dose.  Physical deconditioning 2/2 ambulatory dysfunction- (present on admission) - needs SNF due to combination of severe deconditioning and poor cognition due to #1 above.   Witnessed seizure ([name]) - No apparent prior history of seizure disorder noting patient was not on AEDs prior to admission,  Unclear if withdrawal seizure or related to other substances, EEG unremarkable.   Continue Keppra at recommendation of neurology.  Acute kidney injury with rhabdomyolysis -  Resolved. Nephrology was consulted with initial plans to initiate dialysis including placement of [facility] but renal function gradually improved and dialysis not needed and TDC removed.  Moderate Protein calorie malnutrition ([name]) - on protein supplementation.   Possible DVT (deep venous thrombosis) ([name]) - Per critical care note, hemodialysis cath had a clot on it when it was removed.  UE duplex  12/31: no DVT.  Acting Xarelto.  Right ventricular dysfunction- (present on admission) - Echo 1/2 with EF 60 to 65%.  Cannot rule out a small PFO this finding determined to be clinically insignificant given the patient was asymptomatic  Aspiration pneumonia ([name])-resolved as of 03/04/2021 -   Completed a course of Zosyn  Severe sepsis without septic shock ([name])  Resolved   Elevated transaminases at time of presentation-resolved as of 03/04/2021 - initially due to sepsis then due to Zyprexa.  Zyprexa dose has  been decreased LFTs are improving.  Continue to monitor.  Hypokalemia.  Replaced.    Objective   Physical Exam: Vitals:   03/12/21 1139 03/12/21 2017 03/13/21 0837 03/13/21 1110  BP: 108/84 119/87 116/80 111/82  Pulse: 84 84 75 91   Resp: 20 20 17 19   Temp: 98.7 F (37.1 C) 98.1 F (36.7 C) 98.1 F (36.7 C) 98.2 F (36.8 C)  TempSrc: Oral Oral Oral Oral  SpO2: 98% 99% 100% 100%  Weight:      Height:       Awake Alert x1, No new F.N deficits,   [place].AT,PERRAL Supple Neck, No JVD,   Symmetrical Chest wall movement, Good air movement bilaterally, CTAB RRR,No Gallops, Rubs or new Murmurs,  +ve B.Sounds, Abd Soft, No tenderness,   No Cyanosis, Clubbing or edema   Recent Labs  Lab 03/09/21 1539 03/13/21 0613  WBC 5.6 4.9  HGB 13.6 12.2*  HCT 39.1 33.7*  PLT 265 210  MCV 93.5 91.6  MCH 32.5 33.2  MCHC 34.8 36.2*  RDW 13.2 12.8  LYMPHSABS 2.2 2.2  MONOABS 0.6 0.6  EOSABS 0.2 0.1  BASOSABS 0.0 0.0    Recent Labs  Lab 03/09/21 1539 03/10/21 0801 03/13/21 0613  NA 140 140 137  K 3.8 3.8 3.3*  CL 102 103 103  CO2 29 25 25   GLUCOSE 91 111* 98  BUN 28* 30* 16  CREATININE 0.79 0.60* 0.51*  CALCIUM 9.7 9.8 9.0  AST 32  --  27  ALT 33  --  24  ALKPHOS 58  --  42  BILITOT 0.2*  --  0.4  ALBUMIN 3.8  --  3.5  MG  --   --  1.9  CRP 2.1*  --   --   AMMONIA 29  --   --     DVT Prophylaxis  .Rivaroxaban (xarelto) tablet 10 mg , Rivaroxaban (xarelto) tablet 10 mg   Planned Discharge Destination:  Skilled nursing facility  Medically stable: No-still titrating behavioral meds  COVID vaccination status:  Unknown  Consultants: Nephrology Psychiatry    Procedures: EEG Echocardiogram TDC   Antibiotics: Unasyn x1 dose, cefepime x1 dose, ceftriaxone x1 dose, vancomycin x2 doses  Scheduled Meds:   amLODipine  2.5 mg Oral Daily   aspirin  325 mg Oral Daily   clonazePAM  0.5 mg Oral BID   docusate sodium  100 mg Oral BID   feeding supplement  237 mL Oral TID AC & HS   FLUoxetine  40 mg Oral Daily   folic acid  1 mg Oral Daily   gabapentin  100 mg Oral TID   levETIRAcetam  500 mg Oral BID   mouth rinse  15 mL Mouth Rinse BID   melatonin  10 mg Oral QHS   nicotine  21 mg Transdermal  Daily   OLANZapine  2.5 mg Oral Daily   OLANZapine  7.5 mg Oral QHS   polyethylene glycol  17 g Oral Daily   potassium chloride  40 mEq Oral Once   rivaroxaban  10 mg Oral Daily   thiamine  250 mg Oral Daily   valproic acid  500 mg Oral BID   Continuous Infusions: PRN Meds:.acetaminophen, docusate sodium, OLANZapine zydis **OR** OLANZapine, ondansetron (ZOFRAN) IV, polyethylene glycol, traZODone  Time spent: 15 minutes  Signature  03/15/21 M.D on 03/13/2021 at 11:18 AM   -  To page go to [email or web address]

## 2021-03-13 NOTE — Plan of Care (Signed)

## 2021-03-14 LAB — BASIC METABOLIC PANEL
Anion gap: 7 (ref 5–15)
BUN: 16 mg/dL (ref 6–20)
CO2: 28 mmol/L (ref 22–32)
Calcium: 9.5 mg/dL (ref 8.9–10.3)
Chloride: 105 mmol/L (ref 98–111)
Creatinine, Ser: 0.64 mg/dL (ref 0.61–1.24)
GFR, Estimated: >60 mL/min (ref >60)
Glucose, Bld: 96 mg/dL (ref 70–99)
Potassium: 4.3 mmol/L (ref 3.5–5.1)
Sodium: 140 mmol/L (ref 135–145)

## 2021-03-14 MED ORDER — OLANZAPINE 2.5 MG PO TABS: 5.0000 mg | ORAL_TABLET | Freq: Every day | ORAL | Status: DC

## 2021-03-14 MED ORDER — OLANZAPINE 10 MG IM SOLR
2.5000 mg | Freq: Once | INTRAMUSCULAR | Status: AC
  Administered 2021-03-14: 2.5 mg via INTRAMUSCULAR
  Filled 2021-03-14: qty 10

## 2021-03-14 NOTE — Progress Notes (Signed)
Progress Note   Patient: Steve [name] AYO:[id number] DOB: 08/29/1965 DOA: 01/05/2021     68 DOS: the patient was seen and examined on 03/14/2021   Brief hospital course: Steve [name] is a 56 y.o. male with history of major depression, anxiety who was in a residential ETOH treatment center for last 5 months. He was brought to the ED on 12/20 from a residential ETOH treatment center for convalescence, combativeness and altered sensorium.  Per collateral history, patient was not acting typical for last few days.  In the ED, patient was confused, combative. Labs showed AKI with creatinine of 3.27, rhabdomyolysis, abnormal LFTs, elevated WBC count at 23.7 and lactic acid level at 7.6. 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 Blood culture was sent, empiric antibiotic started.  IV fluid resuscitation started and patient was initially admitted to ICU. 12/21, underwent LP which showed staph in CSF, felt to be contaminant.  EEG did not show any seizures 12/24, HD catheter was placed but patient did not require HD as creatinine started to improve 12/31, with clinical improvement, patient was transferred out of ICU to [facility] ([place] 1/18 Cognitive evaluations: 2 on the SLUMS, a 9 on the MMSE, and a 0 on medi-cog and short blessed test   Subjective: Patient in bed much awake today and alert, denies any headache chest or abdominal pain.  No shortness of breath.   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 Also evidence of prior lacunar infarct right basal ganglia, was seen by Neuro. seen by both neurology and psychiatry.  Had been extremely sedated earlier in the week of  03/10/2021.  Psych and neurology were reconsulted.  Multiple medication changes were made.  Mentation has improved significantly on 03/14/2021, continue present combination of trazodone, valproic acid, Zyprexa, Keppra, Prozac and Neurontin.  Will gently taper off sedative medications as needed.  Valproic acid noted to be low on 03/13/2021 pharmacy consulted to adjust dose.  Physical deconditioning 2/2 ambulatory dysfunction- (present on admission) - needs SNF due to combination of severe deconditioning and poor cognition due to #1 above.   Witnessed seizure ([name]) - No apparent prior history of seizure disorder noting patient was not on AEDs prior to admission,  Unclear if withdrawal seizure or related to other substances, EEG unremarkable.   Continue Keppra at recommendation of neurology.  Acute kidney injury with rhabdomyolysis -  Resolved. Nephrology was consulted with initial plans to initiate dialysis including placement of [facility] but renal function gradually improved and dialysis not needed and TDC removed.  Moderate Protein calorie malnutrition ([name]) - on protein supplementation.   Possible DVT (deep venous thrombosis) ([name]) - Per critical care note, hemodialysis cath had a clot on it when it was removed.  UE duplex  12/31: no DVT.  Acting Xarelto.  Right ventricular dysfunction- (present on admission) - Echo 1/2 with EF 60 to 65%.  Cannot rule out a small PFO this finding determined to be clinically insignificant given the patient was asymptomatic  Aspiration pneumonia ([name])-resolved as of 03/04/2021 -   Completed a course of Zosyn  Severe sepsis without septic shock ([name])  Resolved   Elevated transaminases at time of presentation-resolved as of 03/04/2021 - initially due to sepsis then due  to Zyprexa.  Zyprexa dose has been decreased LFTs are improving.  Continue to monitor.  Hypokalemia.  Replaced.    Objective   Physical Exam:  Vitals:   03/13/21 1533 03/13/21 1910 03/14/21 0439  03/14/21 0918  BP: 116/85 (!) 129/97 (!) 122/96 106/78  Pulse: 82 (!) 101 93 95  Resp: 20 17 17 18   Temp: 98.2 F (36.8 C) 98.8 F (37.1 C) 97.7 F (36.5 C) 98 F (36.7 C)  TempSrc: Oral Oral Oral Oral  SpO2: 100% 94% 100% 100%  Weight:      Height:        Awake Alert x 2, No new F.N deficits,   Steve [name],PERRAL Supple Neck, No JVD,   Symmetrical Chest wall movement, Good air movement bilaterally, CTAB RRR,No Gallops, Rubs or new Murmurs,  +ve B.Sounds, Abd Soft, No tenderness,   No Cyanosis, Clubbing or edema    Recent Labs  Lab 03/09/21 1539 03/13/21 0613  WBC 5.6 4.9  HGB 13.6 12.2*  HCT 39.1 33.7*  PLT 265 210  MCV 93.5 91.6  MCH 32.5 33.2  MCHC 34.8 36.2*  RDW 13.2 12.8  LYMPHSABS 2.2 2.2  MONOABS 0.6 0.6  EOSABS 0.2 0.1  BASOSABS 0.0 0.0    Recent Labs  Lab 03/09/21 1539 03/10/21 0801 03/13/21 0613 03/14/21 0554  NA 140 140 137 140  K 3.8 3.8 3.3* 4.3  CL 102 103 103 105  CO2 29 25 25 28   GLUCOSE 91 111* 98 96  BUN 28* 30* 16 16  CREATININE 0.79 0.60* 0.51* 0.64  CALCIUM 9.7 9.8 9.0 9.5  AST 32  --  27  --   ALT 33  --  24  --   ALKPHOS 58  --  42  --   BILITOT 0.2*  --  0.4  --   ALBUMIN 3.8  --  3.5  --   MG  --   --  1.9  --   CRP 2.1*  --   --   --   AMMONIA 29  --   --   --     DVT Prophylaxis  .Rivaroxaban (xarelto) tablet 10 mg , Rivaroxaban (xarelto) tablet 10 mg   Planned Discharge Destination:  Skilled nursing facility  Medically stable: No-still titrating behavioral meds  COVID vaccination status:  Unknown  Consultants: Nephrology Psychiatry    Procedures: EEG Echocardiogram TDC   Antibiotics: Unasyn x1 dose, cefepime x1 dose, ceftriaxone x1 dose, vancomycin x2 doses  Scheduled Meds:   amLODipine  2.5 mg Oral Daily   aspirin  325 mg Oral Daily   clonazePAM  0.5 mg Oral BID   docusate sodium  100 mg Oral BID   feeding supplement  237 mL Oral TID AC & HS   FLUoxetine  40 mg Oral Daily   folic acid  1 mg Oral  Daily   gabapentin  100 mg Oral TID   levETIRAcetam  500 mg Oral BID   mouth rinse  15 mL Mouth Rinse BID   melatonin  10 mg Oral QHS   nicotine  21 mg Transdermal Daily   OLANZapine  2.5 mg Oral Daily   OLANZapine  7.5 mg Oral QHS   polyethylene glycol  17 g Oral Daily   rivaroxaban  10 mg Oral Daily   thiamine  250 mg Oral Daily   valproic acid  750 mg Oral BID   Continuous Infusions: PRN Meds:.acetaminophen, docusate sodium, OLANZapine zydis **OR** OLANZapine, ondansetron (ZOFRAN) IV, polyethylene glycol,  traZODone  Time spent: 15 minutes  Signature  Susa Raring M.D on 03/14/2021 at 9:37 AM   -  To page go to [email or web address]

## 2021-03-14 NOTE — Progress Notes (Addendum)
Pt remains agitated  and throwing stool onto wall and floor, PRN meds given, Dr. Bevely Palmer aware.

## 2021-03-14 NOTE — Plan of Care (Signed)
Pt remains confused, needing frequent orientation    Problem: Education: Goal: Knowledge of General Education information will improve Description: Including pain rating scale, medication(s)/side effects and non-pharmacologic comfort measures Outcome: Not Progressing   Problem: Health Behavior/Discharge Planning: Goal: Ability to manage health-related needs will improve Outcome: Progressing   Problem: Clinical Measurements: Goal: Ability to maintain clinical measurements within normal limits will improve Outcome: Progressing Goal: Will remain free from infection Outcome: Progressing Goal: Diagnostic test results will improve Outcome: Progressing Goal: Respiratory complications will improve Outcome: Progressing Goal: Cardiovascular complication will be avoided Outcome: Progressing   Problem: Activity: Goal: Risk for activity intolerance will decrease Outcome: Progressing   Problem: Nutrition: Goal: Adequate nutrition will be maintained Outcome: Progressing   Problem: Coping: Goal: Level of anxiety will decrease Outcome: Not Progressing   Problem: Elimination: Goal: Will not experience complications related to bowel motility Outcome: Progressing Goal: Will not experience complications related to urinary retention Outcome: Progressing   Problem: Pain Managment: Goal: General experience of comfort will improve Outcome: Progressing   Problem: Coping: Goal: Ability to adjust to condition or change in health will improve Outcome: Progressing Goal: Ability to identify appropriate support needs will improve Outcome: Progressing   Problem: Skin Integrity: Goal: Risk for impaired skin integrity will decrease Outcome: Progressing

## 2021-03-14 NOTE — Progress Notes (Signed)
Pt restless through night. In the beginning of shift he had a BM and began to "play" and fling it. After cleaning pt, he continued to attempt to get OOB. He said, "the helicopters are about to land on [place]" while trying to get OOB. Later he was seeing cats and other people in the room. He continued to be restless and attempted to get OOB several more times through night with sitter in room.

## 2021-03-15 DIAGNOSIS — L309 Dermatitis, unspecified: Secondary | ICD-10-CM | POA: Diagnosis present

## 2021-03-15 LAB — MISC LABCORP TEST (SEND OUT): Labcorp test code: 70789

## 2021-03-15 MED ORDER — HYDROCORTISONE 1 % EX CREA
TOPICAL_CREAM | Freq: Four times a day (QID) | CUTANEOUS | Status: DC
  Administered 2021-03-20: 1 via TOPICAL
  Filled 2021-03-15 (×2): qty 28

## 2021-03-15 MED ORDER — SELENIUM SULFIDE 1 % EX LOTN
TOPICAL_LOTION | Freq: Every day | CUTANEOUS | Status: DC
  Administered 2021-04-01: 1 via TOPICAL
  Filled 2021-03-15 (×2): qty 207

## 2021-03-15 MED ORDER — OLANZAPINE 2.5 MG PO TABS
7.5000 mg | ORAL_TABLET | Freq: Two times a day (BID) | ORAL | Status: DC
  Administered 2021-03-15 – 2021-03-16 (×4): 7.5 mg via ORAL
  Filled 2021-03-15 (×4): qty 3

## 2021-03-15 NOTE — Progress Notes (Signed)
Occupational Therapy Treatment Patient Details Name: Steve [name] MRN: [id number] DOB: Jul 29, 1965 Today's Date: 03/15/2021   History of present illness Pt is a 56 y.o. male admitted from group home for convalescence, combativeness and altered sensorium on 01/05/21 with witnessed seizure, AMS. 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 EEG unremarkable. LP on 12/21 showed staph in CSF, felt to be contaminant. Workup for AKI, rhabdomyolysis. Course complicated by c/o L foot pain; imaging negative for acute injury. Pt remains hospitalized due to difficult to place. Pt with psych consult who felt no need for inpt psych. No PMH in chart.   OT comments  Steve [name] is making limited progress. He continues to require continual cues for safety and sequencing with limited/no insight or carry over. Pt groomed at the sink and required multimodal cues to wash face and hands appropriately. He also required mod A for toilet transfer for safety as he was attempting to sit too early, and was unable to pull undergarments down despite max cues. Pt continues to benefit from OT acutely. D/c recommendation remains appropriate.    Recommendations for follow up therapy are one component of a multi-disciplinary discharge planning process, led by the attending physician.  Recommendations may be updated based on patient status, additional functional criteria and insurance authorization.    Follow Up Recommendations  Skilled nursing-short term rehab (<3 hours/day)    Assistance Recommended at Discharge Frequent or constant Supervision/Assistance  Patient can return home with the following  A little help with walking and/or transfers;A little help with bathing/dressing/bathroom;Assistance with cooking/housework;Assist for transportation;Help with stairs or ramp for entrance   Equipment Recommendations  None  recommended by OT       Precautions / Restrictions Precautions Precautions: Fall Precaution Comments: Recruitment consultant, no posey belt Restrictions Weight Bearing Restrictions: No LLE Weight Bearing: Weight bearing as tolerated Other Position/Activity Restrictions: CAM boot not needed per ortho note       Mobility Bed Mobility Overal bed mobility: Needs Assistance Bed Mobility: Sit to Supine     Supine to sit: Min assist Sit to supine: Min assist   General bed mobility comments: repeated cues for sequencing, assist to initiate    Transfers Overall transfer level: Needs assistance Equipment used: Rolling walker (2 wheels) Transfers: Sit to/from Stand, Bed to chair/wheelchair/BSC Sit to Stand: Min assist           General transfer comment: assist to maintain balance, continual cues for sequencing     Balance Overall balance assessment: Needs assistance Sitting-balance support: Feet supported, No upper extremity supported Sitting balance-Leahy Scale: Good Sitting balance - Comments: Impulsively leaning and repositioning   Standing balance support: No upper extremity supported, During functional activity Standing balance-Leahy Scale: Poor                             ADL either performed or assessed with clinical judgement   ADL Overall ADL's : Needs assistance/impaired     Grooming: Wash/dry hands;Wash/dry face;Standing;Moderate assistance Grooming Details (indicate cue type and reason): direct cues to sequence appropriately                 Toilet Transfer: Moderate assistance;Ambulation Toilet Transfer Details (indicate cue type and reason): attempted to sit to early, mod A to guide hips to land on toilet. multimodial cues         Functional  mobility during ADLs: Minimal assistance;Rolling walker (2 wheels) General ADL Comments: Pt requiring assist for safety due to impulsivity and running into objects on his R side, despite cuing     Extremity/Trunk Assessment Upper Extremity Assessment Upper Extremity Assessment: Generalized weakness   Lower Extremity Assessment Lower Extremity Assessment: Defer to PT evaluation        Vision   Vision Assessment?: No apparent visual deficits   Perception Perception Perception: Not tested   Praxis      Cognition Arousal/Alertness: Awake/alert Behavior During Therapy: Restless, Impulsive Overall Cognitive Status: Impaired/Different from baseline Area of Impairment: Attention, Following commands, Awareness, Safety/judgement, Problem solving, Orientation, Memory                 Orientation Level: Disoriented to, Place, Time, Situation Current Attention Level: Sustained Memory: Decreased short-term memory Following Commands: Follows one step commands consistently, Follows one step commands with increased time Safety/Judgement: Decreased awareness of safety, Decreased awareness of deficits Awareness: Intellectual Problem Solving: Requires verbal cues, Requires tactile cues, Slow processing, Difficulty sequencing General Comments: Follows one step commands inconsistently. required cues for sequencing grooming tasks. sat on toilet with undergarmets on, required cues and max A. plpeasantly confused this session              General Comments VSS on RA, sitter present    Pertinent Vitals/ Pain       Pain Assessment Pain Assessment: Faces Pain Location: LLE Pain Descriptors / Indicators: Grimacing, Guarding Pain Intervention(s): Limited activity within patient's tolerance, Monitored during session         Frequency  Min 1X/week        Progress Toward Goals  OT Goals(current goals can now be found in the care plan section)  Progress towards OT goals: Progressing toward goals  Acute Rehab OT Goals Patient Stated Goal: unable to state OT Goal Formulation: With patient Time For Goal Achievement: 03/25/21 Potential to Achieve Goals: Fair ADL Goals Pt  Will Perform Grooming: with supervision;standing Pt Will Perform Lower Body Bathing: with supervision;sitting/lateral leans Pt Will Perform Lower Body Dressing: with supervision;sit to/from stand Pt Will Transfer to Toilet: with supervision;ambulating Pt Will Perform Toileting - Clothing Manipulation and hygiene: with modified independence;sitting/lateral leans Additional ADL Goal #1: Pt will demonstrate increased attention to task, requiring less than 3 vc during a 5 min ADL task  Plan Discharge plan remains appropriate       AM-PAC OT "6 Clicks" Daily Activity     Outcome Measure   Help from another person eating meals?: A Little Help from another person taking care of personal grooming?: A Little Help from another person toileting, which includes using toliet, bedpan, or urinal?: A Lot Help from another person bathing (including washing, rinsing, drying)?: A Lot Help from another person to put on and taking off regular upper body clothing?: A Lot Help from another person to put on and taking off regular lower body clothing?: A Little 6 Click Score: 15    End of Session Equipment Utilized During Treatment: Gait belt;Rolling walker (2 wheels)  OT Visit Diagnosis: Unsteadiness on feet (R26.81);Muscle weakness (generalized) (M62.81);Low vision, both eyes (H54.2);Ataxia, unspecified (R27.0) Pain - Right/Left: Left Pain - part of body: Leg;Ankle and joints of foot   Activity Tolerance Patient tolerated treatment well   Patient Left in chair;with chair alarm set;with family/visitor present   Nurse Communication Mobility status        Time: 8786-7672 OT Time Calculation (min): 22 min  Charges: OT General Charges $  OT Visit: 1 Visit OT Treatments $Self Care/Home Management : 8-22 mins   Honestee Revard A Victoriya Pol 03/15/2021, 5:11 PM

## 2021-03-15 NOTE — Assessment & Plan Note (Addendum)
-  Resolved with use of topical cortisone cream but has reemerged on his chest.  Hopeful that current prednisone will help.   -After prednisone completed will likely need to resume topical cortisone cream at a low dose and this was resumed with topical cortisone to all areas on extremities, abdomen and back

## 2021-03-15 NOTE — TOC Progression Note (Signed)
Transition of Care (TOC) - Progression Note  ° ° °Patient Details  °Name: Steve [name] °MRN: [id number] °Date of Birth: 11/16/1965 ° °Transition of Care (TOC) CM/SW Contact  ° R Stubbldfield, RN °Phone Number: °03/15/2021, 12:59 PM ° °Clinical Narrative:    °CM met with the patient at the bedside.  DTP Team continues to follow the patient for discharge planning and needed SNF placement.  The patient continues to require sitter at the bedside for safety measures and prevention of falls.  No bed offers have been made at this time for placement. ° ° °Expected Discharge Plan: Skilled Nursing Facility °Barriers to Discharge: Continued Medical Work up, Homeless with medical needs, Inadequate or no insurance ° °Expected Discharge Plan and Services °Expected Discharge Plan: Skilled Nursing Facility °In-house Referral: PCP / [facility] °Discharge Planning Services: CM Consult, [facility] Program, Medication Assistance, Follow-up appt scheduled, [facility] °  °Living arrangements for the past 2 months: Group Home (Patient was resident at [facility] prior to hospital admission.) °Expected Discharge Date: 01/24/21               °  °  °  °  °  °  °  °  °  °  ° ° °Social Determinants of Health (SDOH) Interventions °  ° °Readmission Risk Interventions °No flowsheet data found. ° °

## 2021-03-15 NOTE — Plan of Care (Signed)
° ° °  Ralphael Southgate, is a 56 y.o. male, DOB - March 22, 1965, HYI:[id number]   Patient seen briefly this morning, nurse practitioner to see and follow soon, he is resting comfortably in bed sitting up eating breakfast, calm, knows he is at [facility] but thinks this is situated in [place].  Not agitated.  A.m. Zyprexa dose has been increased.  Much more awake alert, some intermittent agitation which is controlled with as needed Zyprexa.  Vitals:   03/14/21 1944 03/14/21 2344 03/15/21 0500 03/15/21 0746  BP: 124/84 123/70 124/83 111/75  Pulse: 93 99 78 96  Resp: 18 20 18 20   Temp: 98.4 F (36.9 C) 98.4 F (36.9 C) 98.5 F (36.9 C) 98.7 F (37.1 C)  TempSrc: Oral Oral  Oral  SpO2: 99% 98% 100% 98%  Weight:      Height:          Signature  M.D on 03/15/2021 at 8:38 AM   -  To page go to [email or web address]

## 2021-03-15 NOTE — Progress Notes (Signed)
Progress Note   Patient: Steve [name] [id number] DOB: Sep 27, 1965 DOA: 01/05/2021     56 DOS: the patient was seen and examined on 03/15/2021   Brief hospital course: Montique Betzen is a 56 y.o. male with history of major depression, anxiety who was in a residential ETOH treatment center for last 5 months. He was brought to the ED on 12/20 from a residential ETOH treatment center for convalescence, combativeness and altered sensorium.  Per collateral history, patient was not acting typical for last few days.  In the ED, patient was confused, combative. Labs showed AKI with creatinine of 3.27, rhabdomyolysis, abnormal LFTs, elevated WBC count at 23.7 and lactic acid level at 7.6. 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 Blood culture was sent, empiric antibiotic started.  IV fluid resuscitation started and patient was initially admitted to ICU. 12/21, underwent LP which showed staph in CSF, felt to be contaminant.  EEG did not show any seizures 12/24, HD catheter was placed but patient did not require HD as creatinine started to improve 12/31, with clinical improvement, patient was transferred out of ICU to [facility]. 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 Also evidence of prior lacunar infarct right basal ganglia Cognitive screening revealed significant cognitive deficits Recent issues with difficult to control impulsivity-attempts with medication management have been difficult noting with increasing doses of medicine patient became very sedated and was  unable to feed Continue Prozac, DC BuSpar in favor of low-dose Klonopin Continue low-dose Zyprexa during the day as well as current dose of Zyprexa at night.  Have as needed p.o. or IM Zyprexa for intermittent agitation which typically occurs at night Begin melatonin 10 mg at night to help with restlessness and sundowning type behaviors. DC nocturnal Vistaril Decrease Neurontin to 100 mg with eventual plans to discontinue Continue current dose of Depakote with plans to likely taper and discontinue Appreciate psychiatry recommendation-after extensive discussion with attending physician Dr. Sloan Leiter plan was to adjust medications as above -Remains impulsive so Posey belt restraint in place. Resume thiamine-previously had been given high-dose IV thiamine earlier in the hospitalization with some improvement in cognition Continues to experience increased agitation with hallucinations and inappropriate behaviors in the evening and overnight hours. Zyprexa increased to 7.5 mg twice daily beginning on 2/27 Has sitter at bedside (when available) monitor behaviors during the day with Posey belt off.  Unfortunately due to staffing ratios and behaviors as described unable to not place restraint after hours/in the evenings.    Witnessed seizure ([place]) No apparent prior history of seizure disorder noting patient was not on AEDs prior to admission Unclear if withdrawal seizure or related to other substances EEG unremarkable.   Continue Keppra at recommendation of neurology  Acute kidney injury with rhabdomyolysis Resolved Creatinine peaked at 7.48 on 12/26.  Nephrology consulted with initial plans to initiate dialysis including placement of [facility] but renal function gradually improved and dialysis not needed and [facility] removed  Eczema Patient has multiple areas on extremities consistent with atopic dermatitis.  Also has significantly dry scalp with flaking skin. Begin topical corticosteroids to  extremities Begin Selsun shampoo  to hair and scalp If topical steroids ineffective may need to transition to topical tacrolimus   2/27   2/27      Possible DVT (deep venous thrombosis) ([place]) Per critical care note, hemodialysis cath had a clot on it when it was removed.  UE duplex  12/31: no DVT.    Aspiration pneumonia ([name])-resolved as of 03/04/2021 Completed a course of Zosyn  Elevated transaminases at time of presentation-resolved as of 03/04/2021 Likely secondary to sepsis like physiology at presentation His AST and ALT were significantly elevated to over 700s on admission.  Re emergence mild elevation LFTs-likely due to Zyprexa and Depakote Due to oversedation Zyprexa dose decreased LFTs have normalized with decrease in Zyprexa dose. 2/16 valproic acid level 55   Right ventricular dysfunction- (present on admission) Echo 1/2 with EF 60 to 65%.  Cannot rule out a small PFO this finding determined to be clinically insignificant given the patient was asymptomatic  Physical deconditioning 2/2 ambulatory dysfunction- (present on admission) Influenced by patient's poor cognition as well as prior heavy alcohol abuse and malnutrition before admission Therapy continues to recommend SNF placement due to level of assist required with ADLs and IADLs as well as ongoing poor cognition Mobility continues to be influenced not only by cognition but by ambulatory dysfunction/gait and imbalance issues 2/23 despite significant improvement in alertness ambulatory dysfunction has not improved so therefore not influenced by medication Mobility Assessment (most recent)     Mobility Assessment - 03/11/21 2000     Does patient have an order for bedrest or is patient medically unstable No - Continue assessment  What is the highest level of mobility based on the progressive mobility assessment? Level 3 (Stands with assist) - Balance while standing  and cannot march in place    Is the above level  different from baseline mobility prior to current illness? Yes - Recommend PT order               Severe sepsis without septic shock ([place]) Ruled out Sepsis-like physiology secondary to presentation with profound dehydration, hypoperfusion from dehydration related hypotension seizure activity Chest work-up was negative  Protein calorie malnutrition ([place]) Patient having difficulty eating independently and completing meal We will have OT evaluate to determine if adaptive devices indicated Appears to have some visual disturbance as well contributing Increase protein shakes from twice daily to AC/HS        Subjective:    Physical Exam: Vitals:   03/14/21 1944 03/14/21 2344 03/15/21 0500 03/15/21 0746  BP: 124/84 123/70 124/83 111/75  Pulse: 93 99 78 96  Resp: 18 20 18 20   Temp: 98.4 F (36.9 C) 98.4 F (36.9 C) 98.5 F (36.9 C) 98.7 F (37.1 C)  TempSrc: Oral Oral  Oral  SpO2: 99% 98% 100% 98%  Weight:      Height:       Constitutional: NAD, calm but remains impulsive Respiratory: Anterior lung sounds clear, Normal respiratory effort. RA Cardiovascular: Regular pulse, no peripheral edema.  Normotensive.  S1-S2 Abdomen: no tenderness, no masses palpated. Bowel sounds positive. LBM 2/24-upon my exam patient trying to get out of bed because he had been incontinent of stool Neurologic: CN 2-12 grossly intact. Sensation intact, Strength 3+-4/5 x all 4 extremities.  Psychiatric: Alert and oriented times name only.  Safety belt for impulsivity  Data Reviewed: There are no new results to review at this time.  Family Communication:  Patient only  Disposition: Status is: Inpatient Remains inpatient appropriate because:  Unsafe discharge plan secondary to severe cognitive impairment requiring SNF placement.  Currently lacks funding.     A physical therapy consult is indicated based on the patients mobility assessment.   Mobility Assessment (last 72 hours)      Mobility Assessment     Row Name 03/14/21 1200 03/14/21 0952 03/14/21 0830 03/13/21 2034 03/13/21 0800   Does patient have an order for bedrest or is patient medically unstable -- No - Continue assessment No - Continue assessment No - Continue assessment No - Continue assessment   What is the highest level of mobility based on the progressive mobility assessment? -- Level 4 (Walks with assist in room) - Balance while marching in place and cannot step forward and back - Complete Level 4 (Walks with assist in room) - Balance while marching in place and cannot step forward and back - Complete Level 4 (Walks with assist in room) - Balance while marching in place and cannot step forward and back - Complete Level 2 (Chairfast) - Balance while sitting on edge of bed and cannot stand   Is the above level different from baseline mobility prior to current illness? -- Yes - Recommend PT order Yes - Recommend PT order Yes - Recommend PT order Yes - Recommend PT order    Row Name 03/12/21 2000           Does patient have an order for bedrest or is patient medically unstable No - Continue assessment       What is the highest level of mobility based on the progressive mobility assessment? Level 3 (Stands with assist) - Balance while standing  and cannot march in place       Is the above level different from baseline mobility prior to current illness? Yes - Recommend PT order                   DVT Prophylaxis  .Rivaroxaban (xarelto) tablet 10 mg , Rivaroxaban (xarelto) tablet 10 mg   Planned Discharge Destination:  Skilled nursing facility  Medically stable: No-still titrating behavioral meds  COVID vaccination status:  Unknown  Consultants: Nephrology Psychiatry    Procedures: EEG Echocardiogram TDC   Antibiotics: Unasyn x1 dose, cefepime x1 dose, ceftriaxone x1 dose, vancomycin x2 doses     Time spent: 15 minutes  Author: Erin Hearing, NP 03/15/2021 11:18 AM  For on call  review [email or web address].

## 2021-03-16 ENCOUNTER — Inpatient Hospital Stay (HOSPITAL_COMMUNITY): Payer: Medicaid Other

## 2021-03-16 DIAGNOSIS — L308 Other specified dermatitis: Secondary | ICD-10-CM

## 2021-03-16 LAB — VITAMIN B1: Vitamin B1 (Thiamine): 209.5 nmol/L — ABNORMAL HIGH (ref 66.5–200.0)

## 2021-03-16 LAB — VALPROIC ACID LEVEL: Valproic Acid Lvl: 39 ug/mL — ABNORMAL LOW (ref 50.0–100.0)

## 2021-03-16 LAB — VITAMIN B6: Vitamin B6: 12.1 ug/L (ref 3.4–65.2)

## 2021-03-16 IMAGING — DX DG ANKLE 2V *L*
1 series · 2 of 2 positions shown · non-contrast
Comparison: [DATE]

CLINICAL DATA: Pain

EXAM:
LEFT ANKLE - 2 VIEW

[Series 1: ankle · 0.14mm/px · 2 of 2 slices shown]
[im 1/2]
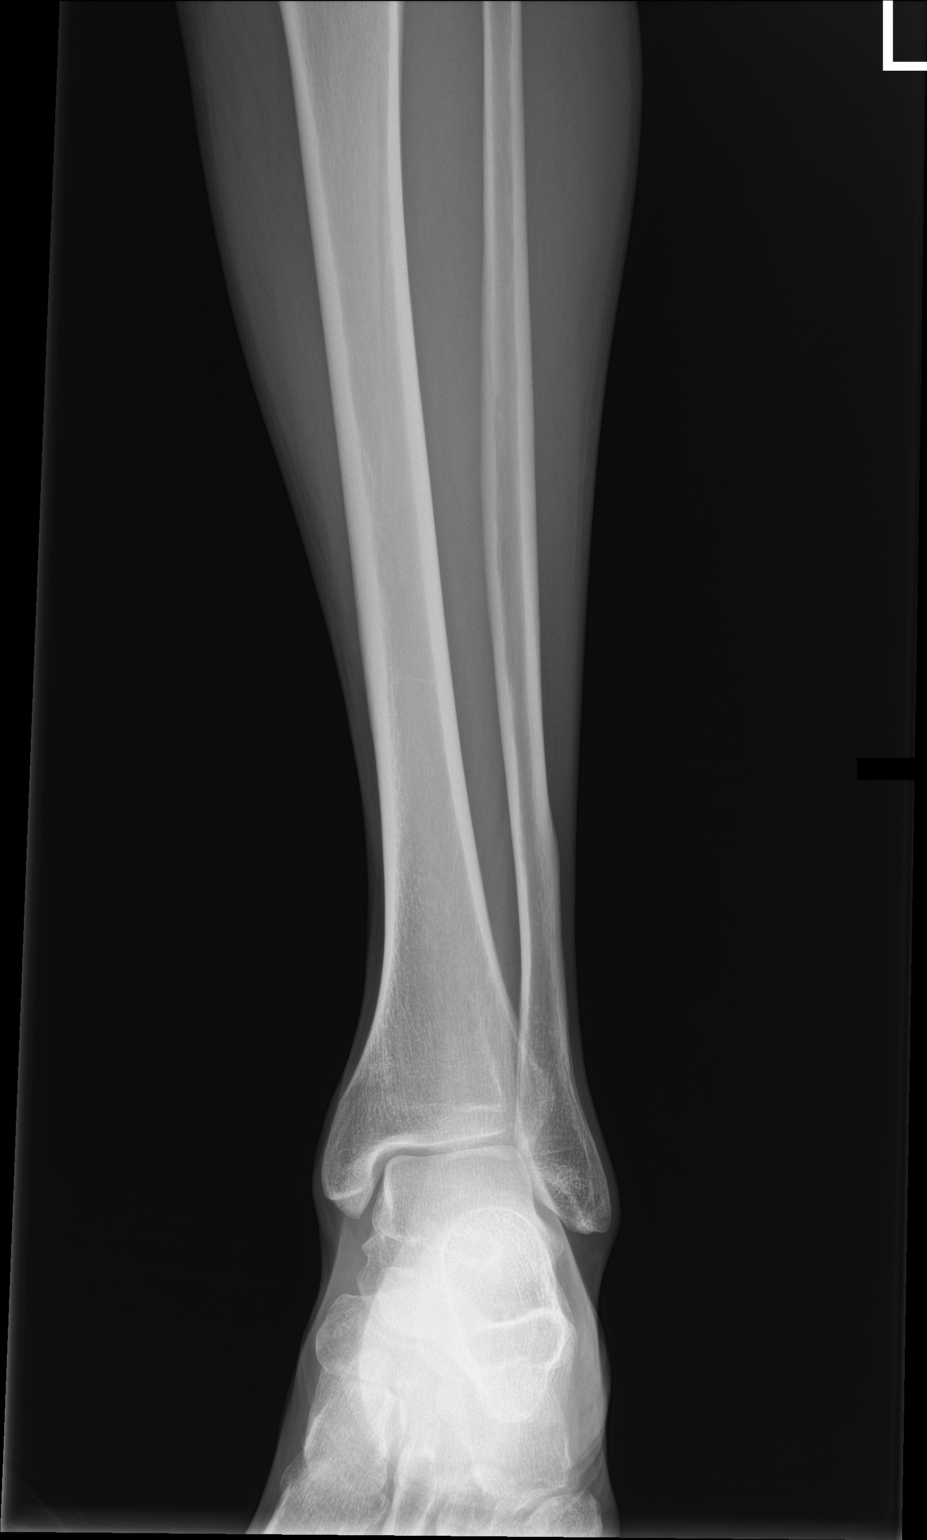
[im 2/2]
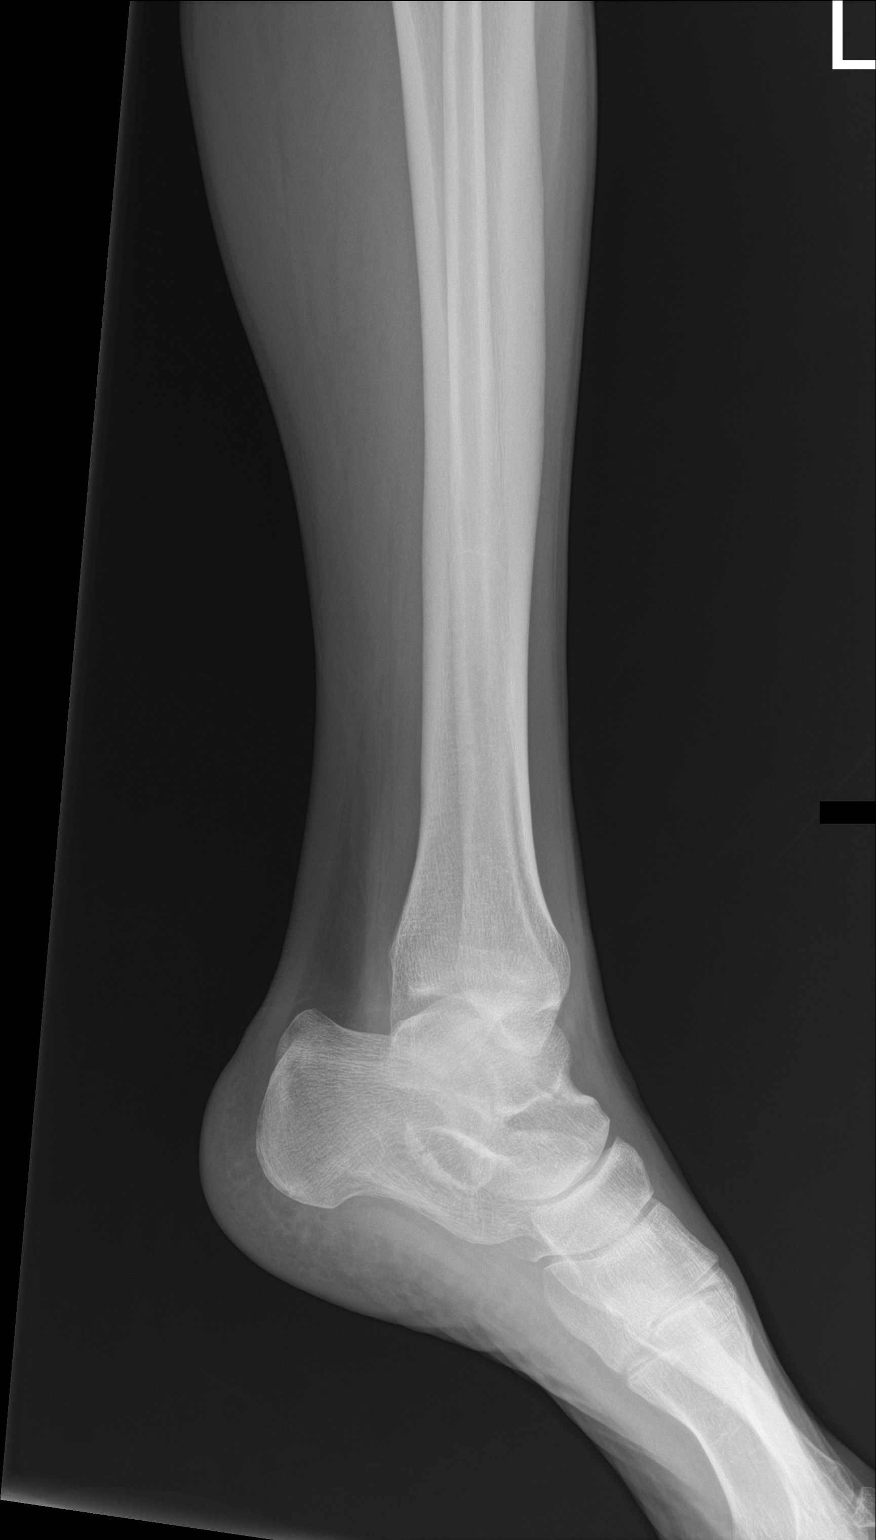

[2 of 2 positions shown; findings below may reference images not displayed]

FINDINGS: The ankle mortise is symmetric and intact. Normal bone
mineralization. Joint spaces are preserved. No acute fracture is
seen. No dislocation.
IMPRESSION: Normal left ankle radiographs.

## 2021-03-16 MED ORDER — OXYCODONE HCL 5 MG PO TABS
10.0000 mg | ORAL_TABLET | Freq: Once | ORAL | Status: AC
  Administered 2021-03-16: 10 mg via ORAL
  Filled 2021-03-16: qty 2

## 2021-03-16 NOTE — Progress Notes (Signed)
Progress Note   Patient: Steve [name] [id number]:[id number] DOB: 1965/07/14 DOA: 01/05/2021     56 DOS: the patient was seen and examined on 03/16/2021   Brief hospital course: Daivik Graper is a 56 y.o. male with history of major depression, anxiety who was in a residential ETOH treatment center for last 5 months. He was brought to the ED on 12/20 from a residential ETOH treatment center for convalescence, combativeness and altered sensorium.  Per collateral history, patient was not acting typical for last few days.  In the ED, patient was confused, combative. Labs showed AKI with creatinine of 3.27, rhabdomyolysis, abnormal LFTs, elevated WBC count at 23.7 and lactic acid level at 7.6. 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 Blood culture was sent, empiric antibiotic started.  IV fluid resuscitation started and patient was initially admitted to ICU. 12/21, underwent LP which showed staph in CSF, felt to be contaminant.  EEG did not show any seizures 12/24, HD catheter was placed but patient did not require HD as creatinine started to improve 12/31, with clinical improvement, patient was transferred out of ICU to [facility]. 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 Also evidence of prior lacunar infarct right basal ganglia Cognitive screening revealed significant cognitive deficits Continue Prozac and low-dose Klonopin Continue Zyprexa BID Cont HS Melatonin 3/1 Neurontin tapered and dc'd Continue Depakote Continue sitter -cont posey belt when sitter not  available. Continue thiamine-previously had been given high-dose IV thiamine earlier in the hospitalization with some improvement in cognition Continues to experience increased agitation with hallucinations and inappropriate behaviors in the evening and overnight hours.     Witnessed seizure ([name]) No apparent prior history of seizure disorder noting patient was not on AEDs prior to admission Unclear if withdrawal seizure or related to other substances EEG unremarkable.   Continue Keppra at recommendation of neurology  Acute kidney injury with rhabdomyolysis Resolved Creatinine peaked at 7.48 on 12/26.  Nephrology consulted with initial plans to initiate dialysis including placement of [facility] but renal function gradually improved and dialysis not needed and [facility] removed  Eczema Patient has multiple areas on extremities consistent with atopic dermatitis.  Also has significantly dry scalp with flaking skin. Continue focal application to topical steroids-if continues to show improvement can apply to other skin lesions Selsun shampoo to hair and scalp   2/27   2/27      Possible DVT (deep venous thrombosis) ([name]) Per critical care note, hemodialysis cath had a clot on it when it was removed.  UE duplex  12/31: no DVT.    Aspiration pneumonia ([name])-resolved as of 03/04/2021 Completed a course of Zosyn  Elevated transaminases at time of presentation-resolved as of 03/04/2021 Likely secondary to sepsis like physiology at presentation His AST and ALT were significantly elevated to over 700s on admission.  Re emergence mild elevation LFTs-likely due to Zyprexa and Depakote Due to oversedation Zyprexa dose decreased LFTs have normalized with decrease in Zyprexa dose. 2/16 valproic acid level 55   Right ventricular dysfunction- (present on admission) Echo 1/2 with EF 60 to 65%.  Cannot rule out a small PFO  this finding determined to be clinically insignificant given the patient was  asymptomatic  Physical deconditioning 2/2 ambulatory dysfunction- (present on admission) Influenced by patient's poor cognition as well as prior heavy alcohol abuse and malnutrition before admission Therapy continues to recommend SNF placement due to level of assist required with ADLs and IADLs as well as ongoing poor cognition Mobility continues to be influenced not only by cognition but by ambulatory dysfunction/gait and imbalance issues 2/23 despite significant improvement in alertness ambulatory dysfunction has not improved so therefore not influenced by medication  A physical therapy consult is indicated based on the patients mobility assessment.   Mobility Assessment (last 72 hours)     Mobility Assessment     Row Name 03/15/21 1700 03/15/21 1200 03/14/21 1200 03/14/21 0952 03/14/21 0830   Does patient have an order for bedrest or is patient medically unstable -- No - Continue assessment -- No - Continue assessment No - Continue assessment   What is the highest level of mobility based on the progressive mobility assessment? Level 4 (Walks with assist in room) - Balance while marching in place and cannot step forward and back - Complete Level 4 (Walks with assist in room) - Balance while marching in place and cannot step forward and back - Complete -- Level 4 (Walks with assist in room) - Balance while marching in place and cannot step forward and back - Complete Level 4 (Walks with assist in room) - Balance while marching in place and cannot step forward and back - Complete   Is the above level different from baseline mobility prior to current illness? -- Yes - Recommend PT order -- Yes - Recommend PT order Yes - Recommend PT order    Row Name 03/13/21 2034 03/13/21 0800         Does patient have an order for bedrest or is patient medically unstable No - Continue assessment No - Continue assessment      What is the highest level of mobility based on the progressive mobility  assessment? Level 4 (Walks with assist in room) - Balance while marching in place and cannot step forward and back - Complete Level 2 ([place]) - Balance while sitting on edge of bed and cannot stand      Is the above level different from baseline mobility prior to current illness? Yes - Recommend PT order Yes - Recommend PT order                 Severe sepsis without septic shock ([place]) Ruled out Sepsis-like physiology secondary to presentation with profound dehydration, hypoperfusion from dehydration related hypotension seizure activity Chest work-up was negative  Protein calorie malnutrition ([place]) Patient having difficulty eating independently and completing meal We will have OT evaluate to determine if adaptive devices indicated Appears to have some visual disturbance as well contributing Increase protein shakes from twice daily to AC/HS        Subjective:    Physical Exam: Vitals:   03/15/21 1700 03/15/21 1954 03/16/21 0346 03/16/21 0805  BP: 100/83 114/84 109/89 113/77  Pulse: (!) 104 98 80 81  Resp: 17 18 18 19   Temp: 98 F (36.7 C) 98.9 F (37.2 C) 97.9 F (36.6 C) 97.7 F (36.5 C)  TempSrc:  Oral Oral Oral  SpO2: 100% 99% 100% 100%  Weight:      Height:       Constitutional: NAD, calm but remains impulsive Respiratory: Anterior lung sounds clear, Normal respiratory effort. RA Cardiovascular: Regular  pulse, no peripheral edema.  Normotensive.  S1-S2 Abdomen: no tenderness, no masses palpated. Bowel sounds positive. LBM 2/24-upon my exam patient trying to get out of bed because he had been incontinent of stool Neurologic: CN 2-12 grossly intact. Sensation intact, Strength 3+-4/5 x all 4 extremities.  Psychiatric: Alert and oriented times name only.  Safety belt for impulsivity  Data Reviewed: There are no new results to review at this time.  Family Communication:  Patient only  Disposition: Status is: Inpatient Remains inpatient appropriate  because:  Unsafe discharge plan secondary to severe cognitive impairment requiring SNF placement.  Currently lacks funding.      DVT Prophylaxis  .Rivaroxaban (xarelto) tablet 10 mg , Rivaroxaban (xarelto) tablet 10 mg   Planned Discharge Destination:  Skilled nursing facility  Medically stable: No-still titrating behavioral meds  COVID vaccination status:  Unknown  Consultants: Nephrology Psychiatry    Procedures: EEG Echocardiogram TDC   Antibiotics: Unasyn x1 dose, cefepime x1 dose, ceftriaxone x1 dose, vancomycin x2 doses     Time spent: 15 minutes  Author: Erin Hearing, NP 03/16/2021 10:33 AM  For on call review [email or web address].

## 2021-03-16 NOTE — TOC Progression Note (Signed)
Transition of Care [facility]) - Progression Note    Patient Details  Name: Steve [name] MRN: [id number] Date of Birth: November 12, 1965  [facility] CM/SW Contact  Janae Bridgeman, RN Phone Number: 03/16/2021, 11:41 AM  Clinical Narrative:    Patient continues to require a safety sitter at the bedside and Posey belt secure to insure that patient does not climb out of the bed unattended or fall.  Junious Silk, NP has made adjustments in his current medications.  No bed offers for SNF placement at the this time. CM and MSW with DTP Team continue to follow the patient for SNF placement.   Expected Discharge Plan: Skilled Nursing Facility Barriers to Discharge: Continued Medical Work up, Homeless with medical needs, Inadequate or no insurance  Expected Discharge Plan and Services Expected Discharge Plan: Skilled Nursing Facility In-house Referral: PCP / [facility] Discharge Planning Services: CM Consult, [facility] Program, Medication Assistance, Follow-up appt scheduled, [facility]igent [facility]   Living arrangements for the past 2 months: Group Home (Patient was resident at [facility] [place] prior to hospital admission.) Expected Discharge Date: 01/24/21                                     Social Determinants of Health (SDOH) Interventions    Readmission Risk Interventions No flowsheet data found.

## 2021-03-16 NOTE — Progress Notes (Signed)
Physical Therapy Treatment Patient Details Name: Steve [name] MRN: [id number] DOB: 1965/07/07 Today's Date: 03/16/2021   History of Present Illness Pt is a 56 y.o. male admitted from group home for convalescence, combativeness and altered sensorium on 01/05/21 with witnessed seizure, AMS. 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 EEG unremarkable. LP on 12/21 showed staph in CSF, felt to be contaminant. Workup for AKI, rhabdomyolysis. Course complicated by c/o L foot pain; imaging negative for acute injury. Pt remains hospitalized due to difficult to place. Pt with psych consult who felt no need for inpt psych. No PMH in chart.    PT Comments    Patient making good progress with mobility today but remains greatly limited by cognitive deficits with poor safety awareness. Pt completed bed mobility with supervision and transfers/gait with min guard/HHA for safety. Pt continues to amb with narrow BOS and tendency to drift Lt, 2 episodes of LOB requiring min assist. EOS pt able to complete repeated sit<>stands without UE use and min guard at EOB for safety. He will continue to benefit from skilled PT interventions to progress independence, safety remains greatly limited by cognition and he continues to require 24/7 supervision. Will progress as able.    Recommendations for follow up therapy are one component of a multi-disciplinary discharge planning process, led by the attending physician.  Recommendations may be updated based on patient status, additional functional criteria and insurance authorization.  Follow Up Recommendations  Skilled nursing-short term rehab (<3 hours/day)     Assistance Recommended at Discharge Frequent or constant Supervision/Assistance  Patient can return home with the following A little help with walking and/or transfers;Assistance with  cooking/housework;A little help with bathing/dressing/bathroom;Direct supervision/assist for financial management;Direct supervision/assist for medications management;Assist for transportation   Equipment Recommendations  Rolling walker (2 wheels)    Recommendations for Other Services Speech consult     Precautions / Restrictions Precautions Precautions: Fall Precaution Comments: Recruitment consultant, no posey belt Restrictions Weight Bearing Restrictions: No LLE Weight Bearing: Weight bearing as tolerated Other Position/Activity Restrictions: CAM boot not needed per ortho note     Mobility  Bed Mobility Overal bed mobility: Needs Assistance Bed Mobility: Supine to Sit     Supine to sit: Supervision, HOB elevated     General bed mobility comments: pt using bed rail, supervision and min cues for safety.    Transfers Overall transfer level: Needs assistance Equipment used: 1 person hand held assist Transfers: Sit to/from Stand Sit to Stand: Min assist, Min guard           General transfer comment: pt able to initiate rise without assist cues for safety required to move slow/deter pt's impulsivity. min gaurd /assist to steady in standing and offer HHA.pt completed repeat sit<>stands for LE strengthening.    Ambulation/Gait Ambulation/Gait assistance: Min assist, +2 safety/equipment (chair follow) Gait Distance (Feet): 150 Feet Assistive device: 1 person hand held assist Gait Pattern/deviations: Step-through pattern, Decreased stride length, Drifts right/left Gait velocity: fair     General Gait Details: 1HHA for gait this date with chair follow for safety. Pt with 2 episodes of LOB to Lt and 1x knee appeared to buckle. pt reaching out for additional external support and cues needed to deter pt reaching outside BOS. pt tends to have narrow BOS causing stagger/scissoring gait and min assist to prevent LOB. cues needed to redirect pt to navigate hallway and back to  room, pt  mistaking two rooms that other pt's are in as his own.   Stairs             Wheelchair Mobility    Modified Rankin (Stroke Patients Only)       Balance Overall balance assessment: Needs assistance Sitting-balance support: Feet supported, No upper extremity supported Sitting balance-Leahy Scale: Good Sitting balance - Comments: pt able to don socks at EOB with significant weight shift to lift LE onto contralateral knee.   Standing balance support: Single extremity supported, No upper extremity supported, Reliant on assistive device for balance, During functional activity Standing balance-Leahy Scale: Poor Standing balance comment: reliant on external/UE support                            Cognition Arousal/Alertness: Awake/alert Behavior During Therapy: Impulsive Overall Cognitive Status: Impaired/Different from baseline Area of Impairment: Attention, Following commands, Awareness, Safety/judgement, Problem solving, Orientation, Memory                 Orientation Level: Disoriented to, Place, Time, Situation Current Attention Level: Sustained Memory: Decreased short-term memory Following Commands: Follows one step commands consistently, Follows one step commands with increased time Safety/Judgement: Decreased awareness of safety, Decreased awareness of deficits Awareness: Intellectual Problem Solving: Difficulty sequencing, Requires verbal cues          Exercises Other Exercises Other Exercises: 10x sit<>stand, no UE use for power up    General Comments General comments (skin integrity, edema, etc.): VSS, pt has sitter      Pertinent Vitals/Pain Pain Assessment Pain Assessment: Faces Faces Pain Scale: No hurt Pain Location: LLE Pain Intervention(s): Monitored during session, Limited activity within patient's tolerance    Home Living                          Prior Function            PT Goals (current goals can now be found  in the care plan section) Acute Rehab PT Goals Patient Stated Goal: None stated PT Goal Formulation: Patient unable to participate in goal setting Time For Goal Achievement: 03/18/21 Potential to Achieve Goals: Fair Progress towards PT goals: Progressing toward goals    Frequency    Min 2X/week      PT Plan Current plan remains appropriate    Co-evaluation              AM-PAC PT "6 Clicks" Mobility   Outcome Measure  Help needed turning from your back to your side while in a flat bed without using bedrails?: None Help needed moving from lying on your back to sitting on the side of a flat bed without using bedrails?: None Help needed moving to and from a bed to a chair (including a wheelchair)?: A Little Help needed standing up from a chair using your arms (e.g., wheelchair or bedside chair)?: A Little Help needed to walk in hospital room?: A Little Help needed climbing 3-5 steps with a railing? : A Lot 6 Click Score: 19    End of Session Equipment Utilized During Treatment: Gait belt Activity Tolerance: Patient tolerated treatment well Patient left: in bed;with nursing/sitter in room;with call bell/phone within reach (sitting EOB) Nurse Communication: Mobility status PT Visit Diagnosis: Other abnormalities of gait and mobility (R26.89);Other symptoms and signs involving the nervous system (R29.898) Pain - Right/Left: Left Pain - part of body: Ankle and joints of  foot     Time: 1120-1136 PT Time Calculation (min) (ACUTE ONLY): 16 min  Charges:  $Therapeutic Exercise: 8-22 mins                     Wynn Maudlin, DPT Acute Rehabilitation Services Office [phone] Pager [phone]    Anitra Lauth 03/16/2021, 11:53 AM

## 2021-03-17 MED ORDER — OLANZAPINE 2.5 MG PO TABS
2.5000 mg | ORAL_TABLET | Freq: Once | ORAL | Status: DC
  Filled 2021-03-17: qty 1

## 2021-03-17 MED ORDER — CLONAZEPAM 0.5 MG PO TABS
1.0000 mg | ORAL_TABLET | Freq: Every day | ORAL | Status: DC
  Administered 2021-03-17 – 2021-03-19 (×3): 1 mg via ORAL
  Filled 2021-03-17 (×3): qty 2

## 2021-03-17 MED ORDER — CLONAZEPAM 0.5 MG PO TABS
0.5000 mg | ORAL_TABLET | ORAL | Status: DC
  Administered 2021-03-17 – 2021-04-08 (×45): 0.5 mg via ORAL
  Filled 2021-03-17 (×45): qty 1

## 2021-03-17 MED ORDER — OLANZAPINE 2.5 MG PO TABS
10.0000 mg | ORAL_TABLET | ORAL | Status: DC
Start: 2021-03-17 — End: 2021-03-17

## 2021-03-17 MED ORDER — VALPROIC ACID 250 MG PO CAPS
750.0000 mg | ORAL_CAPSULE | Freq: Three times a day (TID) | ORAL | Status: DC
  Administered 2021-03-17 – 2021-03-20 (×10): 750 mg via ORAL
  Filled 2021-03-17 (×10): qty 3

## 2021-03-17 MED ORDER — OLANZAPINE 2.5 MG PO TABS
15.0000 mg | ORAL_TABLET | ORAL | Status: DC
  Administered 2021-03-17 – 2021-03-20 (×4): 15 mg via ORAL
  Filled 2021-03-17 (×4): qty 6

## 2021-03-17 MED ORDER — OLANZAPINE 2.5 MG PO TABS
7.5000 mg | ORAL_TABLET | Freq: Every day | ORAL | Status: DC
  Administered 2021-03-17 – 2021-03-19 (×3): 7.5 mg via ORAL
  Filled 2021-03-17 (×3): qty 3

## 2021-03-17 NOTE — Progress Notes (Signed)
Speech Language Pathology Treatment: Cognitive-Linquistic  ?Patient Details ?Name: Steve [name] ?MRN: [id number] ?DOB: 03-06-1965 ?Today's Date: 03/17/2021 ?Time: [id number] ?SLP Time Calculation (min) (ACUTE ONLY): 23 min ? ?Assessment / Plan / Recommendation ?Clinical Impression ? Pt was seen for cognition linguistic treatment. Pt was alert and cooperative during the session. Pt demonstrated improved awareness and stated at the onset of the session, "I'm not gonna lie, my mind's been bad...it needs to be fixed, but it's definitely better." Pt demonstrated difficulty with global coherence and topic appropriateness. With cues to recall the conversational topic, he was intermittently able to return to the original topic. He demonstrated 60% accuracy with a medication management (prescription) task increasing to 100% with cues for attention. Pt exhibited difficulty with recall throughout the session; SLP suspects that this is due to impairments in attention impacting encoding. Pt demonstrated 100% accuracy with a simple reasoning (category naming) task. He completed an app-based attention task with 50% accuracy given cues. He exhibited an average response time of 16.43 seconds with a range of 4.73-40.95 seconds. SLP will continue to follow pt.   ?  ?HPI HPI: Pt is a 56 y.o. male admitted from group home (substance abuse rehab?) on 01/05/21 with witnessed seizure, AMS. 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 EEG unremarkable. LP on 12/21 showed staph in CSF, felt to be contaminant. Workup for AKI, rhabdomyolysis. Dx Uremic encephalopathy. Course complicated by c/o L foot pain; imaging negative for acute injury. Pt remains hospitalized due to difficulty with placement. SLP evaluation on 01/26/21: 9/27 on [place] Exam, SLUMS 02/03/21: 2/30; SLUMS 1/27: 1/30. Psych consulted and as of 1/16, pt  did not meet criteria for inpatient psychiatric admission. ?  ?   ?SLP Plan ? Continue with current plan of care ? ?  ?  ?Recommendations for follow up therapy are one component of a multi-disciplinary discharge planning process, led by the attending physician.  Recommendations may be updated based on patient status, additional functional criteria and insurance authorization. ?  ? ?Recommendations  ?   ?   ?    ?   ? ? ? ? Oral Care Recommendations: Oral care BID ?Follow Up Recommendations: Skilled nursing-short term rehab (<3 hours/day) ?Assistance recommended at discharge: Frequent or constant Supervision/Assistance ?SLP Visit Diagnosis: Cognitive communication deficit (R41.841);Attention and concentration deficit ?Attention and concentration deficit following: Other cerebrovascular disease ?Plan: Continue with current plan of care ? ? ? ? ?  ?  ? ?Jatia Musa I. Hardin Negus, [place], CCC-SLP ?Acute Rehabilitation Services ?Office number [phone] ?Pager [phone] ? ?Steve [name] ? ?03/17/2021, 10:10 AM ? ? ? ?

## 2021-03-17 NOTE — Progress Notes (Signed)
Patient currently being combative and agitated, ripping at condom catheter, brief and gown. Repeated attempts to get up even with sitter in room. [place] about receiving IM PRN shortly. Will speak with MD about any other possible methods.  ?

## 2021-03-17 NOTE — Progress Notes (Addendum)
Progress Note   Patient: Steve [name] [id number] DOB: June 18, 1965 DOA: 01/05/2021     71 DOS: the patient was seen and examined on 03/17/2021   Brief hospital course: Steve [name] is a 56 y.o. male with history of major depression, anxiety who was in a residential ETOH treatment center for last 5 months. He was brought to the ED on 12/20 from a residential ETOH treatment center for convalescence, combativeness and altered sensorium.  Per collateral history, patient was not acting typical for last few days.  In the ED, patient was confused, combative. Labs showed AKI with creatinine of 3.27, rhabdomyolysis, abnormal LFTs, elevated WBC count at 23.7 and lactic acid level at 7.6. 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 Blood culture was sent, empiric antibiotic started.  IV fluid resuscitation started and patient was initially admitted to ICU. 12/21, underwent LP which showed staph in CSF, felt to be contaminant.  EEG did not show any seizures 12/24, HD catheter was placed but patient did not require HD as creatinine started to improve 12/31, with clinical improvement, patient was transferred out of ICU to [facility]. 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 Also evidence of prior lacunar infarct right basal ganglia Cognitive screening revealed significant cognitive deficits Continue Prozac, Depakote, Klonopin and Zyprexa with dosage adjustments as noted below Cont HS  Melatonin Continue sitter -cont posey belt when sitter not available. Continue thiamine-previously had been given high-dose IV thiamine earlier in the hospitalization with some improvement in cognition Continues to experience increased agitation with hallucinations and inappropriate behaviors in the evening and overnight hours. **3/1 given recurrent significant nocturnal behavioral issues and agitation I have adjusted his medications as follows: P.m. Zyprexa increased to 15 mg, Klonopin adjusted to have 0.5 mg at 8 AM and 4 PM and have added a 1 mg dose at at bedtime.  Would not adjust Depakote at this juncture.  3/1 just after 1 PM patient with increasing agitation and unable to be redirected by nursing staff.  Was given an extra dose of olanzapine 2.5 mg.  Please see above regarding dosage adjustments and benzodiazepine and nocturnal olanzapine.     Witnessed seizure ([name]) No apparent prior history of seizure disorder noting patient was not on AEDs prior to admission Unclear if withdrawal seizure or related to other substances EEG unremarkable.   Continue Keppra at recommendation of neurology  Acute kidney injury with rhabdomyolysis-resolved as of 03/17/2021 Resolved Creatinine peaked at 7.48 on 12/26.  Nephrology consulted with initial plans to initiate dialysis including placement of [facility] but renal function gradually improved and dialysis not needed and [facility] removed  Eczema Patient has multiple areas on extremities consistent with atopic dermatitis.  Also has significantly dry scalp with flaking skin. Continue topical steroids Selsun shampoo to hair and scalp As of 3/1 significant decrease in pruritus and redness of lesions on extremities   2/27   2/27      Aspiration pneumonia ([name])-resolved as of 03/04/2021 Completed a course of Zosyn  Possible DVT (deep venous thrombosis) ([name])-resolved  as of 03/17/2021 Per critical care note, hemodialysis cath had a clot on it when it was  removed.  UE duplex  12/31: no DVT.    Elevated transaminases at time of presentation-resolved as of 03/04/2021 Likely secondary to sepsis like physiology at presentation His AST and ALT were significantly elevated to over 700s on admission.  Re emergence mild elevation LFTs-likely due to Zyprexa and Depakote Due to oversedation Zyprexa dose decreased LFTs have normalized with decrease in Zyprexa dose. 2/16 valproic acid level 55   Right ventricular dysfunction- (present on admission) Echo 1/2 with EF 60 to 65%.  Cannot rule out a small PFO this finding determined to be clinically insignificant given the patient was asymptomatic  Physical deconditioning 2/2 ambulatory dysfunction- (present on admission) Influenced by patient's poor cognition as well as prior heavy alcohol abuse and malnutrition before admission Therapy continues to recommend SNF placement due to level of assist required with ADLs and IADLs as well as ongoing poor cognition Mobility continues to be influenced not only by cognition but by ambulatory dysfunction/gait and imbalance issues 2/23 despite significant improvement in alertness ambulatory dysfunction has not improved so therefore not influenced by medication  A physical therapy consult is indicated based on the patients mobility assessment.   Mobility Assessment (last 72 hours)     Mobility Assessment     Row Name 03/15/21 1700 03/15/21 1200 03/14/21 1200 03/14/21 0952 03/14/21 0830   Does patient have an order for bedrest or is patient medically unstable -- No - Continue assessment -- No - Continue assessment No - Continue assessment   What is the highest level of mobility based on the progressive mobility assessment? Level 4 (Walks with assist in room) - Balance while marching in place and cannot step forward and back - Complete Level 4 (Walks with assist in room) - Balance while marching in place and cannot step forward and back - Complete -- Level 4 (Walks with  assist in room) - Balance while marching in place and cannot step forward and back - Complete Level 4 (Walks with assist in room) - Balance while marching in place and cannot step forward and back - Complete   Is the above level different from baseline mobility prior to current illness? -- Yes - Recommend PT order -- Yes - Recommend PT order Yes - Recommend PT order    Row Name 03/13/21 2034 03/13/21 0800         Does patient have an order for bedrest or is patient medically unstable No - Continue assessment No - Continue assessment      What is the highest level of mobility based on the progressive mobility assessment? Level 4 (Walks with assist in room) - Balance while marching in place and cannot step forward and back - Complete Level 2 (Chairfast) - Balance while sitting on edge of bed and cannot stand      Is the above level different from baseline mobility prior to current illness? Yes - Recommend PT order Yes - Recommend PT order                 Severe sepsis without septic shock ([name])-resolved as of 03/17/2021 Ruled out Sepsis-like physiology secondary to presentation with profound dehydration, hypoperfusion from dehydration related hypotension seizure activity Chest work-up was negative  Protein calorie malnutrition [facility]) Patient having difficulty eating independently and completing meal We will have OT evaluate to determine if adaptive devices indicated Appears to have some visual disturbance as well contributing  Increase protein shakes from twice daily to AC/HS        Subjective:    Physical Exam: Vitals:   03/17/21 0312 03/17/21 0744 03/17/21 0750 03/17/21 1100  BP: 102/73 120/90 112/90 99/73  Pulse: 83 100 97 83  Resp: 19 18 17 19   Temp: 97.9 F (36.6 C) 98.6 F (37 C) 98.6 F (37 C) 97.6 F (36.4 C)  TempSrc: Oral Oral Oral Oral  SpO2: 100% 100% 98% 97%  Weight:      Height:       Constitutional: NAD, calm but remains impulsive Respiratory: Anterior  lung sounds clear, Normal respiratory effort. RA Cardiovascular: Regular pulse, no peripheral edema.  Normotensive.  S1-S2 Abdomen: no tenderness, no masses palpated. Bowel sounds positive. LBM 2/24-upon my exam patient trying to get out of bed because he had been incontinent of stool Neurologic: CN 2-12 grossly intact. Sensation intact, Strength 3+-4/5 x all 4 extremities.  Psychiatric: Alert and oriented times name only.  Safety belt for impulsivity  Data Reviewed: There are no new results to review at this time.  Family Communication:  Patient only  Disposition: Status is: Inpatient Remains inpatient appropriate because:  Unsafe discharge plan secondary to severe cognitive impairment requiring SNF placement.  Currently lacks funding.      DVT Prophylaxis  .Rivaroxaban (xarelto) tablet 10 mg , Rivaroxaban (xarelto) tablet 10 mg   Planned Discharge Destination:  Skilled nursing facility  Medically stable: No-still titrating behavioral meds  COVID vaccination status:  Unknown  Consultants: Nephrology Psychiatry    Procedures: EEG Echocardiogram TDC   Antibiotics: Unasyn x1 dose, cefepime x1 dose, ceftriaxone x1 dose, vancomycin x2 doses     Time spent: 15 minutes  Author: , NP 03/17/2021 1:34 PM  For on call review [email or web address].

## 2021-03-17 NOTE — Progress Notes (Signed)
Pharmacy - Depakote ? ?VP level a little low at 39 (2/28) ?With agitation ? ?Plan: ?Increase valproic to 750 mg po TID ?Next level in 1 to 2 weeks ? ?Thank you ?Okey Regal, PharmD ?

## 2021-03-17 NOTE — Plan of Care (Signed)
?  Problem: Health Behavior/Discharge Planning: ?Goal: Ability to manage health-related needs will improve ?Outcome: Progressing ?  ?Problem: Clinical Measurements: ?Goal: Ability to maintain clinical measurements within normal limits will improve ?Outcome: Progressing ?Goal: Will remain free from infection ?Outcome: Progressing ?Goal: Diagnostic test results will improve ?Outcome: Progressing ?Goal: Respiratory complications will improve ?Outcome: Progressing ?Goal: Cardiovascular complication will be avoided ?Outcome: Progressing ?  ?Problem: Activity: ?Goal: Risk for activity intolerance will decrease ?Outcome: Progressing ?  ?Problem: Nutrition: ?Goal: Adequate nutrition will be maintained ?Outcome: Progressing ?  ?Problem: Coping: ?Goal: Level of anxiety will decrease ?Outcome: Progressing ?  ?Problem: Elimination: ?Goal: Will not experience complications related to bowel motility ?Outcome: Progressing ?Goal: Will not experience complications related to urinary retention ?Outcome: Progressing ?  ?Problem: Pain Managment: ?Goal: General experience of comfort will improve ?Outcome: Progressing ?  ?Problem: Safety: ?Goal: Ability to remain free from injury will improve ?Outcome: Progressing ?  ?Problem: Skin Integrity: ?Goal: Risk for impaired skin integrity will decrease ?Outcome: Progressing ?  ?Problem: Education: ?Goal: Expressions of having a comfortable level of knowledge regarding the disease process will increase ?Outcome: Progressing ?  ?Problem: Coping: ?Goal: Ability to adjust to condition or change in health will improve ?Outcome: Progressing ?Goal: Ability to identify appropriate support needs will improve ?Outcome: Progressing ?  ?Problem: Health Behavior/Discharge Planning: ?Goal: Compliance with prescribed medication regimen will improve ?Outcome: Progressing ?  ?Problem: Medication: ?Goal: Risk for medication side effects will decrease ?Outcome: Progressing ?  ?Problem: Clinical  Measurements: ?Goal: Complications related to the disease process, condition or treatment will be avoided or minimized ?Outcome: Progressing ?Goal: Diagnostic test results will improve ?Outcome: Progressing ?  ?Problem: Safety: ?Goal: Verbalization of understanding the information provided will improve ?Outcome: Progressing ?  ?Problem: Self-Concept: ?Goal: Level of anxiety will decrease ?Outcome: Progressing ?Goal: Ability to verbalize feelings about condition will improve ?Outcome: Progressing ?  ?

## 2021-03-17 NOTE — Progress Notes (Signed)
HOSPITAL MEDICINE OVERNIGHT EVENT NOTE   ? ?Notified by nursing that patient has been extremely agitated throughout the evening.  Patient has at times been swinging at staff, is regularly attempted to get a bed, is pulling at medical devices and is an ongoing fall risk.  Patient is placing himself at risk of injury. ? ?There does happen to be a sitter available and therefore I have renewed the sitter order which is certainly appropriate this time.  We will continue to monitor closely. ? ?Marinda Elk  MD ?[facility]  ? ? ? ? ? ? ? ? ? ? ?

## 2021-03-18 NOTE — Progress Notes (Signed)
Progress Note   Patient: Steve [name] ONG:[id number] DOB: May 11, 1965 DOA: 01/05/2021     72 DOS: the patient was seen and examined on 03/18/2021   Brief hospital course: Jace Freier is a 56 y.o. male with history of major depression, anxiety who was in a residential ETOH treatment center for last 5 months. He was brought to the ED on 12/20 from a residential ETOH treatment center for convalescence, combativeness and altered sensorium.  Per collateral history, patient was not acting typical for last few days.  In the ED, patient was confused, combative. Labs showed AKI with creatinine of 3.27, rhabdomyolysis, abnormal LFTs, elevated WBC count at 23.7 and lactic acid level at 7.6. 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 Blood culture was sent, empiric antibiotic started.  IV fluid resuscitation started and patient was initially admitted to ICU. 12/21, underwent LP which showed staph in CSF, felt to be contaminant.  EEG did not show any seizures 12/24, HD catheter was placed but patient did not require HD as creatinine started to improve 12/31, with clinical improvement, patient was transferred out of ICU to [facility]. 1/18 Cognitive evaluations: 2 on the SLUMS, a 9 on the MMSE, and a 0 on medi-cog and short blessed test  Assessment and Plan: Left  LE pain Recurrent XR neg Plan is to use simple ankle support for ambulation  Cognitive impairment  2/2 Wernicke's encephalopathy and alcoholic dementia- (present on admission) Initial MRI revealed tiny acute infarct on the right but not significant enough to explain persistent cognitive and behavioral abnormalities. Also evidence of prior lacunar infarct right basal ganglia Cognitive screening revealed significant cognitive deficits Continue Prozac, Depakote, Klonopin and Zyprexa with dosage adjustments as noted below Cont HS  Melatonin Continue sitter -cont posey belt when sitter not available. Continue thiamine-previously had been given high-dose IV thiamine earlier in the hospitalization with some improvement in cognition Medications adjusted on 3/1 with an early afternoon dose of Klonopin added in the nighttime dose to change to 1 mg.  Olanzapine was also increased to 15 mg.  Patient had a better night overnight into 3/2.  Slept through the night and had no apparent agitation yesterday afternoon    Witnessed seizure ([name]) No apparent prior history of seizure disorder noting patient was not on AEDs prior to admission Unclear if withdrawal seizure or related to other substances EEG unremarkable.   Continue Keppra at recommendation of neurology  Acute kidney injury with rhabdomyolysis-resolved as of 03/17/2021 Resolved Creatinine peaked at 7.48 on 12/26.  Nephrology consulted with initial plans to initiate dialysis including placement of [facility] but renal function gradually improved and dialysis not needed and [facility] removed  Eczema Patient has multiple areas on extremities consistent with atopic dermatitis.  Also has significantly dry scalp with flaking skin. Continue topical steroids Selsun shampoo to hair and scalp As of 3/1 significant decrease in pruritus and redness of lesions on extremities   2/27   2/27      Aspiration pneumonia ([name])-resolved as of 03/04/2021 Completed a course of Zosyn  Possible DVT (deep venous thrombosis) ([name])-resolved as of 03/17/2021 Per critical care note, hemodialysis cath had a clot on it when it was removed.  UE duplex  12/31: no DVT.    Elevated transaminases at time of presentation-resolved as of 03/04/2021 Likely secondary to sepsis like physiology at presentation His AST and ALT were significantly elevated to over 700s on  admission.  Re emergence mild elevation LFTs-likely due to Zyprexa and Depakote Due to oversedation Zyprexa dose decreased LFTs have normalized with  decrease in Zyprexa dose. 2/16 valproic acid level 55   Right ventricular dysfunction- (present on admission) Echo 1/2 with EF 60 to 65%.  Cannot rule out a small PFO this finding determined to be clinically insignificant given the patient was asymptomatic  Physical deconditioning 2/2 ambulatory dysfunction- (present on admission) Influenced by patient's poor cognition as well as prior heavy alcohol abuse and malnutrition before admission Therapy continues to recommend SNF placement due to level of assist required with ADLs and IADLs as well as ongoing poor cognition Mobility continues to be influenced not only by cognition but by ambulatory dysfunction/gait and imbalance issues 2/23 despite significant improvement in alertness ambulatory dysfunction has not improved so therefore not influenced by medication  A physical therapy consult is indicated based on the patients mobility assessment.   Mobility Assessment (last 72 hours)     Mobility Assessment     Row Name 03/15/21 1700 03/15/21 1200 03/14/21 1200 03/14/21 0952 03/14/21 0830   Does patient have an order for bedrest or is patient medically unstable -- No - Continue assessment -- No - Continue assessment No - Continue assessment   What is the highest level of mobility based on the progressive mobility assessment? Level 4 (Walks with assist in room) - Balance while marching in place and cannot step forward and back - Complete Level 4 (Walks with assist in room) - Balance while marching in place and cannot step forward and back - Complete -- Level 4 (Walks with assist in room) - Balance while marching in place and cannot step forward and back - Complete Level 4 (Walks with assist in room) - Balance while marching in place and cannot step forward and back - Complete   Is the above level different from baseline mobility prior to current illness? -- Yes - Recommend PT order -- Yes - Recommend PT order Yes - Recommend PT order    Row Name  03/13/21 2034 03/13/21 0800         Does patient have an order for bedrest or is patient medically unstable No - Continue assessment No - Continue assessment      What is the highest level of mobility based on the progressive mobility assessment? Level 4 (Walks with assist in room) - Balance while marching in place and cannot step forward and back - Complete Level 2 (Chairfast) - Balance while sitting on edge of bed and cannot stand      Is the above level different from baseline mobility prior to current illness? Yes - Recommend PT order Yes - Recommend PT order                 Severe sepsis without septic shock ([name])-resolved as of 03/17/2021 Ruled out Sepsis-like physiology secondary to presentation with profound dehydration, hypoperfusion from dehydration related hypotension seizure activity Chest work-up was negative  Protein calorie malnutrition ([name]) Patient having difficulty eating independently and completing meal We will have OT evaluate to determine if adaptive devices indicated Appears to have some visual disturbance as well contributing Increase protein shakes from twice daily to AC/HS        Subjective:  Sleeping soundly.  Staff report he had a better night.  Slept through the night and had no agitated outbursts.  Physical Exam: Vitals:   03/17/21 1700 03/17/21 1936 03/17/21 2317 03/18/21 0729  BP: 112/76 98/76 109/75  96/80  Pulse: 91 84 73 70  Resp: 17 16 17 20   Temp: 97.7 F (36.5 C) 98.2 F (36.8 C) 98.3 F (36.8 C) 97.9 F (36.6 C)  TempSrc: Oral Oral Oral Oral  SpO2: 100% 100% 99% 100%  Weight:      Height:       Constitutional: NAD, calm wetly sleeping sound Respiratory: Anterior lung sounds clear, Normal respiratory effort. RA Cardiovascular: Regular pulse, no peripheral edema.  Normotensive.  S1-S2 Abdomen: no tenderness, no masses palpated. Bowel sounds positive. LBM 3/1 Neurologic: CN 2-12 grossly intact. Sensation intact, Strength 3+-4/5  x all 4 extremities.  Psychiatric: Sleeping soundly.  Sitter at bedside.  Data Reviewed: There are no new results to review at this time.  Family Communication:  Patient only  Disposition: Status is: Inpatient Remains inpatient appropriate because:  Unsafe discharge plan secondary to severe cognitive impairment requiring SNF placement.  Currently lacks funding.      DVT Prophylaxis  .Rivaroxaban (xarelto) tablet 10 mg , Rivaroxaban (xarelto) tablet 10 mg   Planned Discharge Destination:  Skilled nursing facility  Medically stable: No-still titrating behavioral meds  COVID vaccination status:  Unknown  Consultants: Nephrology Psychiatry    Procedures: EEG Echocardiogram TDC   Antibiotics: Unasyn x1 dose, cefepime x1 dose, ceftriaxone x1 dose, vancomycin x2 doses     Time spent: 15 minutes  Author: , NP 03/18/2021 10:02 AM  For on call review [email or web address].

## 2021-03-18 NOTE — TOC Progression Note (Addendum)
Transition of Care (TOC) - Progression Note  ? ? ?Patient Details  ?Name: Steve [name] ?MRN: [id number] ?Date of Birth: 1965-09-11 ? ?Transition of Care (TOC) CM/SW Contact  ?Curlene Labrum, RN ?Phone Number: ?03/18/2021, 10:16 AM ? ?Clinical Narrative:    ?CM met with the patient for assessment with Lissa Merlin, NP.  Patient was sleeping at this time.  Patient continues to require sitter and posey belt for safety to prevent falls.  The patient's medications are being adjusted for behaviors at this time.  No bed offers for SNF placement at this time but CM and MSW with DTP Team will continue to follow the patient for placement. ? ?03/18/2021 - CM spoke with Dr. Ree Kida at leadership length of stay meeting and asked to have consult placed for psychiatry or neuro-medicine for medication management.  I notified Erin Hearing, NP - psychiatry consulted last week on 03/10/2021 and medication adjustments made accordingly. ? ?CM and MSW with DTP will continue to follow the patient for SNF placement needs. ? ? ?Expected Discharge Plan: [place] ?Barriers to Discharge: Continued Medical Work up, Homeless with medical needs, Inadequate or no insurance ? ?Expected Discharge Plan and Services ?Expected Discharge Plan: [place] ?In-house Referral: PCP / Health Connect ?Discharge Planning Services: CM Consult, [place] [facility], Medication Assistance, Follow-up appt scheduled, [facility] ?  ?Living arrangements for the past 2 months: [place] (Patient was resident at [facility] prior to hospital admission.) ?Expected Discharge Date: 01/24/21               ?  ?  ?  ?  ?  ?  ?  ?  ?  ?  ? ? ?Social Determinants of Health (SDOH) Interventions ?  ? ?Readmission Risk Interventions ?No flowsheet data found. ? ?

## 2021-03-18 NOTE — Plan of Care (Signed)
?  Problem: Health Behavior/Discharge Planning: ?Goal: Ability to manage health-related needs will improve ?Outcome: Progressing ?  ?Problem: Clinical Measurements: ?Goal: Ability to maintain clinical measurements within normal limits will improve ?Outcome: Progressing ?Goal: Will remain free from infection ?Outcome: Progressing ?Goal: Diagnostic test results will improve ?Outcome: Progressing ?Goal: Respiratory complications will improve ?Outcome: Progressing ?Goal: Cardiovascular complication will be avoided ?Outcome: Progressing ?  ?Problem: Activity: ?Goal: Risk for activity intolerance will decrease ?Outcome: Progressing ?  ?Problem: Nutrition: ?Goal: Adequate nutrition will be maintained ?Outcome: Progressing ?  ?Problem: Coping: ?Goal: Level of anxiety will decrease ?Outcome: Progressing ?  ?Problem: Elimination: ?Goal: Will not experience complications related to bowel motility ?Outcome: Progressing ?Goal: Will not experience complications related to urinary retention ?Outcome: Progressing ?  ?Problem: Pain Managment: ?Goal: General experience of comfort will improve ?Outcome: Progressing ?  ?Problem: Safety: ?Goal: Ability to remain free from injury will improve ?Outcome: Progressing ?  ?Problem: Skin Integrity: ?Goal: Risk for impaired skin integrity will decrease ?Outcome: Progressing ?  ?Problem: Education: ?Goal: Expressions of having a comfortable level of knowledge regarding the disease process will increase ?Outcome: Progressing ?  ?Problem: Coping: ?Goal: Ability to adjust to condition or change in health will improve ?Outcome: Progressing ?Goal: Ability to identify appropriate support needs will improve ?Outcome: Progressing ?  ?Problem: Health Behavior/Discharge Planning: ?Goal: Compliance with prescribed medication regimen will improve ?Outcome: Progressing ?  ?Problem: Medication: ?Goal: Risk for medication side effects will decrease ?Outcome: Progressing ?  ?Problem: Clinical  Measurements: ?Goal: Complications related to the disease process, condition or treatment will be avoided or minimized ?Outcome: Progressing ?Goal: Diagnostic test results will improve ?Outcome: Progressing ?  ?Problem: Safety: ?Goal: Verbalization of understanding the information provided will improve ?Outcome: Progressing ?  ?Problem: Self-Concept: ?Goal: Level of anxiety will decrease ?Outcome: Progressing ?Goal: Ability to verbalize feelings about condition will improve ?Outcome: Progressing ?  ?

## 2021-03-18 NOTE — Progress Notes (Signed)
Occupational Therapy Treatment ?Patient Details ?Name: Steve [name] ?MRN: [id number] ?DOB: [birth year] ?Today's Date: 03/18/2021 ? ? ?History of present illness Pt is a 56 y.o. male admitted from group home for convalescence, combativeness and altered sensorium on 01/05/21 with witnessed seizure, AMS. 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 EEG unremarkable. LP on 12/21 showed staph in CSF, felt to be contaminant. Workup for AKI, rhabdomyolysis. Course complicated by c/o L foot pain; imaging negative for acute injury. Pt remains hospitalized due to difficult to place. Pt with psych consult who felt no need for inpt psych. No PMH in chart. ?  ?OT comments ? Patient received in bed and asking for shower. Patient was supervision for bed mobility and required cues due to impulsiveness for safety.  Patient performed shower seated and standing with frequent cues for safety and sequencing. Patient required cues for grooming for sequencing, attempting to load toothbrush with mouth rinse. Patient making good progress and will continue to be followed by acute OT.   ? ?Recommendations for follow up therapy are one component of a multi-disciplinary discharge planning process, led by the attending physician.  Recommendations may be updated based on patient status, additional functional criteria and insurance authorization. ?   ?Follow Up Recommendations ? Skilled nursing-short term rehab (<3 hours/day)  ?  ?Assistance Recommended at Discharge Frequent or constant Supervision/Assistance  ?Patient can return home with the following ? A little help with walking and/or transfers;A little help with bathing/dressing/bathroom;Assistance with cooking/housework;Assist for transportation;Help with stairs or ramp for entrance ?  ?Equipment Recommendations ? None recommended by OT  ?  ?Recommendations for Other Services    ? ?  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 ? ?  ? ?Mobility Bed Mobility ?Overal bed mobility: Needs Assistance ?Bed Mobility: Supine to Sit ?  ?  ?Supine to sit: Supervision, HOB elevated ?  ?  ?General bed mobility comments: required cues to pace self before turning off bed alarm ?  ? ?Transfers ?Overall transfer level: Needs assistance ?Equipment used: Rolling walker (2 wheels) ?Transfers: Sit to/from Stand ?Sit to Stand: Min assist, Min guard ?  ?  ?  ?  ?  ?General transfer comment: performed transfer to shower chair and recliner ?  ?  ?Balance Overall balance assessment: Needs assistance ?Sitting-balance support: Feet supported, No upper extremity supported ?Sitting balance-Leahy Scale: Good ?Sitting balance - Comments: able to sit on EOB without assitance ?  ?Standing balance support: Single extremity supported, No upper extremity supported, Reliant on assistive device for balance, During functional activity ?Standing balance-Leahy Scale: Poor ?Standing balance comment: reliant on external/UE support ?  ?  ?  ?  ?  ?  ?  ?  ?  ?  ?  ?   ? ?ADL either performed or assessed with clinical judgement  ? ?ADL Overall ADL's : Needs assistance/impaired ?  ?  ?Grooming: Wash/dry face;Oral care;Brushing hair;Minimal assistance;Standing;Cueing for sequencing ?Grooming Details (indicate cue type and reason): cues to for sequencing ?Upper Body Bathing: Set up;Sitting ?Upper Body Bathing Details (indicate cue type and reason): performed in shower ?Lower Body Bathing: Minimal assistance;Sitting/lateral leans;Sit to/from stand ?Lower Body Bathing Details (indicate cue type and reason): performed bathing legs seated on shower chair  and peri area while standing with assistance to  complete ?Upper Body Dressing : Minimal assistance;Sitting ?Upper Body Dressing Details (indicate cue type and reason): to donn gown ?Lower Body Dressing: Minimal assistance;Sit to/from stand ?Lower Body Dressing Details (indicate cue type and reason): donned socks and boxers ?  ?  ?  ?  ?Tub/ Shower Transfer: Walk-in shower;Minimal assistance ?Tub/Shower Transfer Details (indicate cue type and reason): performed shower transfer in room ?Functional mobility during ADLs: Minimal assistance;Rolling walker (2 wheels) ?General ADL Comments: requires frequent cues for safety and sequencing during self care due to impulsivity ?  ? ?Extremity/Trunk Assessment   ?  ?  ?  ?  ?  ? ?Vision   ?  ?  ?Perception   ?  ?Praxis   ?  ? ?Cognition Arousal/Alertness: Awake/alert ?Behavior During Therapy: Impulsive ?Overall Cognitive Status: Impaired/Different from baseline ?Area of Impairment: Attention, Following commands, Awareness, Safety/judgement, Problem solving, Orientation, Memory ?  ?  ?  ?  ?  ?  ?  ?  ?Orientation Level: Disoriented to, Place, Time, Situation ?Current Attention Level: Sustained ?Memory: Decreased short-term memory ?Following Commands: Follows one step commands consistently, Follows one step commands with increased time ?Safety/Judgement: Decreased awareness of safety, Decreased awareness of deficits ?Awareness: Intellectual ?Problem Solving: Difficulty sequencing, Requires verbal cues ?General Comments: attempted to load toothbrush with mouth rinse. ?  ?  ?   ?Exercises   ? ?  ?Shoulder Instructions   ? ? ?  ?General Comments    ? ? ?Pertinent Vitals/ Pain       Pain Assessment ?Faces Pain Scale: No hurt ?Pain Intervention(s): Monitored during session ? ?Home Living   ?  ?  ?  ?  ?  ?  ?  ?  ?  ?  ?  ?  ?  ?  ?  ?  ?  ?  ? ?  ?Prior Functioning/Environment    ?  ?  ?  ?   ? ?Frequency ? Min 1X/week  ? ? ? ? ?  ?Progress Toward Goals ? ?OT Goals(current goals can now be found in the care plan section) ?  Progress towards OT goals: Progressing toward goals ? ?Acute Rehab OT Goals ?Patient Stated Goal: Get better ?OT Goal Formulation: With patient ?Time For Goal Achievement: 03/25/21 ?Potential to Achieve Goals: Fair ?ADL Goals ?Pt Will Perform Grooming: with supervision;standing ?Pt Will Perform Lower Body Bathing: with supervision;sitting/lateral leans ?Pt Will Perform Lower Body Dressing: with supervision;sit to/from stand ?Pt Will Transfer to Toilet: with supervision;ambulating ?Pt Will Perform Toileting - Clothing Manipulation and hygiene: with modified independence;sitting/lateral leans ?Additional ADL Goal #1: Pt will demonstrate increased attention to task, requiring less than 3 vc during a 5 min ADL task  ?Plan Discharge plan remains appropriate   ? ?Co-evaluation ? ? ?   ?  ?  ?  ?  ? ?  ?AM-PAC OT "6 Clicks" Daily Activity     ?Outcome Measure ? ? Help from another person eating meals?: A Little ?Help from another person taking care of personal grooming?: A Little ?Help from another person toileting, which includes using toliet, bedpan, or urinal?: A Little ?Help from another person bathing (including washing, rinsing, drying)?: A Little ?Help from another person to put on and taking off regular upper body clothing?: A Little ?Help from another person to put on and taking off regular lower body clothing?: A Little ?6 Click Score: 18 ? ?  ?End of Session Equipment Utilized During  Treatment: Gait belt;Rolling walker (2 wheels) ? ?OT Visit Diagnosis: Unsteadiness on feet (R26.81);Muscle weakness (generalized) (M62.81);Low vision, both eyes (H54.2);Ataxia, unspecified (R27.0) ?  ?Activity Tolerance Patient tolerated treatment well ?  ?Patient Left in chair;with call bell/phone within reach;with chair alarm set;with nursing/sitter in room ?  ?Nurse Communication Mobility status ?  ? ?   ? ?Time: [id number] ?OT Time Calculation (min): 18 min ? ?Charges: OT General Charges ?$OT Visit: 1 Visit ?OT Treatments ?$Self  Care/Home Management : 8-22 mins ? ?Lodema Hong, OTA ?Acute Rehabilitation Services  ?Pager [phone] ?Office [phone] ? ? ?Trixie Dredge ?03/18/2021, 8:47 AM ?

## 2021-03-18 NOTE — Progress Notes (Signed)
Speech Language Pathology Treatment: Cognitive-Linquistic  ?Patient Details ?Name: Steve [name] ?MRN: [id number] ?DOB: Jan 12, 1966 ?Today's Date: 03/18/2021 ?Time: 4656-8127 ?SLP Time Calculation (min) (ACUTE ONLY): 25 min ? ?Assessment / Plan / Recommendation ?Clinical Impression ? Pt was seen for cognitive-linguistic treatment. He was lethargic at the beginning of the session, but this improved as the session progressed. Pt demonstrated 80% accuracy with time identification, but with simple time problems he exhibited increased difficulty with 20% accuracy increasing to 40% with verbal prompts. He completed the same app-based attention task as yesterday with 60% accuracy given cues. He exhibited an average response time of 27.82 seconds with a range of 9.48-43.94 seconds. Although his response time was slower, this did appear to improve accuracy. SLP will continue to follow pt.   ?  ?HPI HPI: Pt is a 56 y.o. male admitted from group home (substance abuse rehab?) on 01/05/21 with witnessed seizure, AMS. 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 EEG unremarkable. LP on 12/21 showed staph in CSF, felt to be contaminant. Workup for AKI, rhabdomyolysis. Dx Uremic encephalopathy. Course complicated by c/o L foot pain; imaging negative for acute injury. Pt remains hospitalized due to difficulty with placement. SLP evaluation on 01/26/21: 9/27 on Mini Mental State Exam, SLUMS 02/03/21: 2/30; SLUMS 1/27: 1/30. Psych consulted and as of 1/16, pt did not meet criteria for inpatient psychiatric admission. ?  ?   ?SLP Plan ? Continue with current plan of care ? ?  ?  ?Recommendations for follow up therapy are one component of a multi-disciplinary discharge planning process, led by the attending physician.  Recommendations may be updated based on patient status, additional functional criteria and insurance  authorization. ?  ? ?Recommendations  ?   ?   ?    ?   ? ? ? ? Oral Care Recommendations: Oral care BID ?Follow Up Recommendations: Skilled nursing-short term rehab (<3 hours/day) ?Assistance recommended at discharge: Frequent or constant Supervision/Assistance ?SLP Visit Diagnosis: Cognitive communication deficit (R41.841);Attention and concentration deficit ?Attention and concentration deficit following: Other cerebrovascular disease ?Plan: Continue with current plan of care ? ? ? ? ?  ?  ?Keshia Weare I. [name], MS, CCC-SLP ?Acute Rehabilitation Services ?Office number [phone] ?Pager [phone] ? ? ?[name] ? ?03/18/2021, 1:38 PM ? ? ?

## 2021-03-19 NOTE — TOC Progression Note (Addendum)
Transition of Care (TOC) - Progression Note  ? ? ?Patient Details  ?Name: Steve [name] ?MRN: [id number] ?Date of Birth: May 16, 1965 ? ?Transition of Care (TOC) CM/SW Contact  ?Curlene Labrum, RN ?Phone Number: ?03/19/2021, 10:03 AM ? ?Clinical Narrative:    ?CM sent a message to Shanon Rosser, financial counseling supervisor to check on progress of Medicaid and disability application. I spoke with Stefanie Libel, financial counseling assistant and she states that DSS worker and financial counselor have been unable to reach the patient's son, Jontue Jeannette, by phone to assist with financial documents and Medicaid/disability application.  I called the patient's son on the phone and was unable to leave a secure voicemail.  I sent a text message to the patient's son to have him return my call for follow up and assistance. ? ?CM and MSW with DTP Team continue to follow the patient for SNF placement needs. ? ? ?Expected Discharge Plan: [place] ?Barriers to Discharge: Continued Medical Work up, Homeless with medical needs, Inadequate or no insurance ? ?Expected Discharge Plan and Services ?Expected Discharge Plan: [place] ?In-house Referral: PCP / Health Connect ?Discharge Planning Services: CM Consult, [facility], Medication Assistance, Follow-up appt scheduled, [facility] ?  ?Living arrangements for the past 2 months: [place] (Patient was resident at [facility] prior to hospital admission.) ?Expected Discharge Date: 01/24/21               ?  ?  ?  ?  ?  ?  ?  ?  ?  ?  ? ? ?Social Determinants of Health (SDOH) Interventions ?  ? ?Readmission Risk Interventions ?No flowsheet data found. ? ?

## 2021-03-19 NOTE — Progress Notes (Addendum)
10:50am: ?CSW attempted to reach patient's son via phone and text without success. ? ?10:15am: ?Patient denied a bed at [facility]. ? ?9:30am: ?CSW spoke with French Ana at [facility] who states there are long term are beds available - CSW sent clinicals for review. ? ?Edwin Dada, MSW, LCSW ?Transitions of Care  Clinical Social Worker II ?[phone] ? ?

## 2021-03-19 NOTE — Progress Notes (Signed)
Progress Note   Patient: Steve [name] [id number] DOB: 1965/02/07 DOA: 01/05/2021     73 DOS: the patient was seen and examined on 03/19/2021   Brief hospital course: Steve [name] is a 56 y.o. male with history of major depression, anxiety who was in a residential ETOH treatment center for last 5 months. He was brought to the ED on 12/20 from a residential ETOH treatment center for convalescence, combativeness and altered sensorium.  Per collateral history, patient was not acting typical for last few days.  In the ED, patient was confused, combative. Labs showed AKI with creatinine of 3.27, rhabdomyolysis, abnormal LFTs, elevated WBC count at 23.7 and lactic acid level at 7.6. 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 Blood culture was sent, empiric antibiotic started.  IV fluid resuscitation started and patient was initially admitted to ICU. 12/21, underwent LP which showed staph in CSF, felt to be contaminant.  EEG did not show any seizures 12/24, HD catheter was placed but patient did not require HD as creatinine started to improve 12/31, with clinical improvement, patient was transferred out of ICU to [facility]. 1/18 Cognitive evaluations: 2 on the SLUMS, a 9 on the MMSE, and a 0 on medi-cog and short blessed test  Assessment and Plan: Left  LE pain Recurrent XR neg Plan is to use simple ankle support for ambulation  Cognitive impairment  2/2 Wernicke's encephalopathy and alcoholic dementia Initial MRI revealed tiny acute infarct on the right but not significant enough to explain persistent cognitive and behavioral abnormalities. Also evidence of prior lacunar infarct right basal ganglia Cognitive screening revealed significant cognitive deficits Continue Prozac, Depakote, Klonopin and Zyprexa  Cont HS Melatonin Behaviors continue to improve.  No further afternoon  agitation or nocturnal agitation since Klonopin and Zyprexa doses adjusted. Continue sitter -cont posey belt when sitter not available. Primary rationale for continuing Posey belt is patient's short-term memory and he forgets and tries to get out of bed.  He has significant ambulatory dysfunction and is high risk for falls.  Once his gait improves we will be allowed to remove the Posey belt.  Because of short-term memory issues and associated impulsivity which cannot be effectively controlled with medications without oversedation we are hoping for improvement in gait to prevent self injury and allow belt removal as stated. Continue thiamine-previously had been given high-dose IV thiamine earlier in the hospitalization with some improvement in cognition     Witnessed seizure ([place]) No apparent prior history of seizure disorder noting patient was not on AEDs prior to admission Unclear if withdrawal seizure or related to other substances EEG unremarkable.   Continue Keppra at recommendation of neurology  Acute kidney injury with rhabdomyolysis-resolved as of 03/17/2021 Resolved Creatinine peaked at 7.48 on 12/26.  Nephrology consulted with initial plans to initiate dialysis including placement of [facility] but renal function gradually improved and dialysis not needed and [facility] removed  Eczema Patient has multiple areas on extremities consistent with atopic dermatitis.  Also has significantly dry scalp with flaking skin. Continue topical steroids Selsun shampoo to hair and scalp As of 3/1 significant decrease in pruritus and redness of lesions on extremities-lotion primarily being applied to upper extremities therefore we will add application to lower extremity   2/27   2/27      Aspiration pneumonia ([name])-resolved as of 03/04/2021 Completed a course of Zosyn  Possible DVT (deep venous thrombosis) ([name])-resolved  as of 03/17/2021 Per critical care note, hemodialysis cath had a clot on it when it  was removed.  UE duplex  12/31: no DVT.    Elevated transaminases at time of presentation-resolved as of 03/04/2021 Likely secondary to sepsis like physiology at presentation His AST and ALT were significantly elevated to over 700s on admission.  Re emergence mild elevation LFTs-likely due to Zyprexa and Depakote Due to oversedation Zyprexa dose decreased LFTs have normalized with decrease in Zyprexa dose. 2/16 valproic acid level 55   Right ventricular dysfunction Echo 1/2 with EF 60 to 65%.  Cannot rule out a small PFO this finding determined to be clinically insignificant given the patient was asymptomatic  Physical deconditioning 2/2 ambulatory dysfunction Influenced by patient's poor cognition as well as prior heavy alcohol abuse and malnutrition before admission Therapy continues to recommend SNF placement due to level of assist required with ADLs and IADLs as well as ongoing poor cognition Mobility continues to be influenced not only by cognition but by ambulatory dysfunction/gait and imbalance issues 2/23 despite significant improvement in alertness ambulatory dysfunction has not improved so therefore not influenced by medication  A physical therapy consult is indicated based on the patients mobility assessment.   Mobility Assessment (last 72 hours)     Mobility Assessment     Row Name 03/15/21 1700 03/15/21 1200 03/14/21 1200 03/14/21 0952 03/14/21 0830   Does patient have an order for bedrest or is patient medically unstable -- No - Continue assessment -- No - Continue assessment No - Continue assessment   What is the highest level of mobility based on the progressive mobility assessment? Level 4 (Walks with assist in room) - Balance while marching in place and cannot step forward and back - Complete Level 4 (Walks with assist in room) - Balance while marching in place and cannot step forward and back - Complete -- Level 4 (Walks with assist in room) - Balance while marching in  place and cannot step forward and back - Complete Level 4 (Walks with assist in room) - Balance while marching in place and cannot step forward and back - Complete   Is the above level different from baseline mobility prior to current illness? -- Yes - Recommend PT order -- Yes - Recommend PT order Yes - Recommend PT order    Row Name 03/13/21 2034 03/13/21 0800         Does patient have an order for bedrest or is patient medically unstable No - Continue assessment No - Continue assessment      What is the highest level of mobility based on the progressive mobility assessment? Level 4 (Walks with assist in room) - Balance while marching in place and cannot step forward and back - Complete Level 2 ([place]) - Balance while sitting on edge of bed and cannot stand      Is the above level different from baseline mobility prior to current illness? Yes - Recommend PT order Yes - Recommend PT order                 Severe sepsis without septic shock ([name])-resolved as of 03/17/2021 Ruled out Sepsis-like physiology secondary to presentation with profound dehydration, hypoperfusion from dehydration related hypotension seizure activity Chest work-up was negative  Skin abnormalities/perineal MASD Wound / Incision (Open or Dehisced) 03/09/21 (MASD) Moisture Associated Skin Damage Groin Right redness under scrotum and to the right Groin (Active)  Date First Assessed/Time First Assessed: 03/09/21 0500   Wound Type: (  MASD) Moisture Associated Skin Damage  Location: Groin  Location Orientation: Right  Wound Description (Comments): redness under scrotum and to the right Groin  Present on Admission...    Assessments 03/09/2021  5:00 AM 03/18/2021  8:19 PM  Dressing Type None;Other (Comment) None  Dressing Changed Changed --  Dressing Status Clean, Dry, Intact --  Site / Wound Assessment Clean;Pink;Painful --  Peri-wound Assessment Intact --  Margins Attached edges (approximated) --  Non-staged Wound  Description Not applicable --  Treatment Cleansed;Other (Comment) --     No Linked orders to display    Protein calorie malnutrition ([facility]) Patient having difficulty eating independently and completing meal We will have OT evaluate to determine if adaptive devices indicated Appears to have some visual disturbance as well contributing Increase protein shakes from twice daily to AC/HS        Subjective:  Alert and awake.  More appropriate with interactive speech although remains confused.  Requested tech at bedside to shave his face and she is in the process of trimming the longer beard hairs.  Physical Exam: Vitals:   03/18/21 2019 03/19/21 0058 03/19/21 0418 03/19/21 0832  BP: 104/80 102/73 107/77 105/83  Pulse: 79 84 85 82  Resp: 14 16 17 17   Temp: 97.8 F (36.6 C) 98.2 F (36.8 C) 97.7 F (36.5 C) (!) 97.5 F (36.4 C)  TempSrc: Oral Oral Oral Oral  SpO2: 100% 100% 100% 99%  Weight:      Height:       Constitutional: NAD, calm with appropriate verbal and behavioral interaction today during conversational speech Respiratory: Anterior lung sounds clear, Normal respiratory effort. RA Cardiovascular: Regular pulse, no peripheral edema.  Normotensive.  S1-S2 Abdomen: no tenderness, no masses palpated. Bowel sounds positive. LBM 3/2 Neurologic: CN 2-12 grossly intact. Sensation intact, Strength 3+-4/5 x all 4 extremities.  Psychiatric: Alert and oriented to self only.  Pleasant affect.  Data Reviewed: There are no new results to review at this time.  Family Communication:  Patient only  Disposition: Status is: Inpatient Remains inpatient appropriate because:  Unsafe discharge plan secondary to severe cognitive impairment requiring SNF placement.  Currently lacks funding.      DVT Prophylaxis  .Rivaroxaban (xarelto) tablet 10 mg , Rivaroxaban (xarelto) tablet 10 mg   Planned Discharge Destination:  Skilled nursing facility  Medically stable: No-still titrating  behavioral meds  COVID vaccination status:  Unknown  Consultants: Nephrology Psychiatry    Procedures: EEG Echocardiogram TDC   Antibiotics: Unasyn x1 dose, cefepime x1 dose, ceftriaxone x1 dose, vancomycin x2 doses     Time spent: 15 minutes  Author: Erin Hearing, NP 03/19/2021 10:00 AM  For on call review [email or web address].

## 2021-03-19 NOTE — Assessment & Plan Note (Signed)
Wound / Incision (Open or Dehisced) 03/09/21 (MASD) Moisture Associated Skin Damage Groin Right redness under scrotum and to the right Groin (Active)  Date First Assessed/Time First Assessed: 03/09/21 0500   Wound Type: (MASD) Moisture Associated Skin Damage  Location: Groin  Location Orientation: Right  Wound Description (Comments): redness under scrotum and to the right Groin  Present on Admission...    Assessments 03/09/2021  5:00 AM 03/18/2021  8:19 PM  Dressing Type None;Other (Comment) None  Dressing Changed Changed --  Dressing Status Clean, Dry, Intact --  Site / Wound Assessment Clean;Pink;Painful --  Peri-wound Assessment Intact --  Margins Attached edges (approximated) --  Non-staged Wound Description Not applicable --  Treatment Cleansed;Other (Comment) --     No Linked orders to display

## 2021-03-19 NOTE — Progress Notes (Signed)
Physical Therapy Treatment ?Patient Details ?Name: Steve [name] ?MRN: [id number] ?DOB: 11-Jan-1966 ?Today's Date: 03/19/2021 ? ? ?History of Present Illness Pt is a 56 y.o. male admitted from group home for convalescence, combativeness and altered sensorium on 01/05/21 with witnessed seizure, AMS. 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 EEG unremarkable. LP on 12/21 showed staph in CSF, felt to be contaminant. Workup for AKI, rhabdomyolysis. Course complicated by c/o L foot pain; imaging negative for acute injury. Pt remains hospitalized due to difficult to place. Pt with psych consult who felt no need for inpt psych. No PMH in chart. ? ?  ?PT Comments  ? ? Pt sitting up in recliner upon arrival to room, pt agreeable to session focused on gait training and strengthening exercise. Pt ambulatory around unit with use of HHA, progressing to needing RW given significant anterior LOB. Pt requires max safety cues during mobility, but is improving. Dispo needs remain the same, listed below. PT to continue to follow. ? ?   ?Recommendations for follow up therapy are one component of a multi-disciplinary discharge planning process, led by the attending physician.  Recommendations may be updated based on patient status, additional functional criteria and insurance authorization. ? ?Follow Up Recommendations ? Skilled nursing-short term rehab (<3 hours/day) ?  ?  ?Assistance Recommended at Discharge Frequent or constant Supervision/Assistance  ?Patient can return home with the following A little help with walking and/or transfers;Assistance with cooking/housework;A little help with bathing/dressing/bathroom;Direct supervision/assist for financial management;Direct supervision/assist for medications management;Assist for transportation ?  ?Equipment Recommendations ? Rolling walker (2 wheels)  ?   ?Recommendations for Other Services   ? ? ?  ?Precautions / Restrictions Precautions ?Precautions: Fall ?Precaution Comments: Recruitment consultant ?Restrictions ?Weight Bearing Restrictions: No ?LLE Weight Bearing: Weight bearing as tolerated ?Other Position/Activity Restrictions: CAM boot not needed per ortho note  ?  ? ?Mobility ? Bed Mobility ?Overal bed mobility: Needs Assistance ?  ?  ?  ?  ?  ?  ?General bed mobility comments: up in recliner ?  ? ?Transfers ?Overall transfer level: Needs assistance ?Equipment used: 1 person hand held assist ?Transfers: Sit to/from Stand ?Sit to Stand: Min assist ?  ?  ?  ?  ?  ?General transfer comment: light steadying assist ?  ? ?Ambulation/Gait ?Ambulation/Gait assistance: Min assist, Mod assist ?Gait Distance (Feet): 250 Feet ?Assistive device: 1 person hand held assist, Rolling walker (2 wheels) ?Gait Pattern/deviations: Step-through pattern, Decreased stride length, Drifts right/left, Antalgic ?Gait velocity: decr ?  ?  ?General Gait Details: x1 notable LOB anteriorly requiring significant truncal assist to correct, otherwise light steadying assist and hallway navigation throughout ? ? ?Stairs ?  ?  ?  ?  ?  ? ? ?Wheelchair Mobility ?  ? ?Modified Rankin (Stroke Patients Only) ?  ? ? ?  ?Balance Overall balance assessment: Needs assistance ?Sitting-balance support: Feet supported, No upper extremity supported ?Sitting balance-Leahy Scale: Good ?Sitting balance - Comments: able to sit on EOB without assitance ?  ?Standing balance support: Single extremity supported, No upper extremity supported, Reliant on assistive device for balance, During functional activity ?Standing balance-Leahy Scale: Poor ?Standing balance comment: reliant on external/UE support ?  ?  ?  ?  ?  ?  ?  ?  ?  ?  ?  ?  ? ?  ?Cognition Arousal/Alertness: Awake/alert ?Behavior During Therapy: Impulsive ?Overall Cognitive Status: Impaired/Different  from baseline ?Area of Impairment: Attention, Following  commands, Awareness, Safety/judgement, Problem solving, Orientation, Memory ?  ?  ?  ?  ?  ?  ?  ?  ?Orientation Level: Disoriented to, Place, Time, Situation ?Current Attention Level: Sustained ?Memory: Decreased short-term memory ?Following Commands: Follows one step commands consistently, Follows one step commands with increased time ?Safety/Judgement: Decreased awareness of safety, Decreased awareness of deficits ?Awareness: Intellectual ?Problem Solving: Difficulty sequencing, Requires verbal cues ?  ?  ?  ? ?  ?Exercises Other Exercises ?Other Exercises: 7x sit<>stand, no UE use for power up ? ?  ?General Comments General comments (skin integrity, edema, etc.): sitter at bedside ?  ?  ? ?Pertinent Vitals/Pain Pain Assessment ?Pain Assessment: Faces ?Faces Pain Scale: Hurts little more ?Pain Location: L foot ?Pain Descriptors / Indicators: Grimacing, Guarding ?Pain Intervention(s): Limited activity within patient's tolerance, Monitored during session, Repositioned  ? ? ?Home Living   ?  ?  ?  ?  ?  ?  ?  ?  ?  ?   ?  ?Prior Function    ?  ?  ?   ? ?PT Goals (current goals can now be found in the care plan section) Acute Rehab PT Goals ?Patient Stated Goal: None stated ?PT Goal Formulation: Patient unable to participate in goal setting ?Time For Goal Achievement: 04/02/21 ?Potential to Achieve Goals: Fair ?Progress towards PT goals: Progressing toward goals ? ?  ?Frequency ? ? ? Min 2X/week ? ? ? ?  ?PT Plan Current plan remains appropriate  ? ? ?Co-evaluation   ?  ?  ?  ?  ? ?  ?AM-PAC PT "6 Clicks" Mobility   ?Outcome Measure ? Help needed turning from your back to your side while in a flat bed without using bedrails?: A Little ?Help needed moving from lying on your back to sitting on the side of a flat bed without using bedrails?: A Little ?Help needed moving to and from a bed to a chair (including a wheelchair)?: A Little ?Help needed standing up from a chair using your arms (e.g., wheelchair or bedside  chair)?: A Little ?Help needed to walk in hospital room?: A Little ?Help needed climbing 3-5 steps with a railing? : A Little ?6 Click Score: 18 ? ?  ?End of Session Equipment Utilized During Treatment: Gait belt ?Activity Tolerance: Patient tolerated treatment well ?Patient left: in chair;with call bell/phone within reach;with nursing/sitter in room ?Nurse Communication: Mobility status ?PT Visit Diagnosis: Other abnormalities of gait and mobility (R26.89);Other symptoms and signs involving the nervous system (R29.898) ?Pain - Right/Left: Left ?Pain - part of body: Leg ?  ? ? ?Time: 1020-1035 ?PT Time Calculation (min) (ACUTE ONLY): 15 min ? ?Charges:  $Gait Training: 8-22 mins          ?          ?Marye Round, PT DPT ?Acute Rehabilitation Services ?Pager [phone]  ?Office [phone] ? ? ?Mykiah Schmuck E Stroup ?03/19/2021, 10:46 AM ? ?

## 2021-03-20 DIAGNOSIS — L8990 Pressure ulcer of unspecified site, unspecified stage: Secondary | ICD-10-CM | POA: Diagnosis present

## 2021-03-20 LAB — COMPREHENSIVE METABOLIC PANEL
ALT: 82 U/L — ABNORMAL HIGH (ref 0–44)
AST: 86 U/L — ABNORMAL HIGH (ref 15–41)
Albumin: 3.5 g/dL (ref 3.5–5.0)
Alkaline Phosphatase: 38 U/L (ref 38–126)
Anion gap: 13 (ref 5–15)
BUN: 18 mg/dL (ref 6–20)
CO2: 21 mmol/L — ABNORMAL LOW (ref 22–32)
Calcium: 9.2 mg/dL (ref 8.9–10.3)
Chloride: 103 mmol/L (ref 98–111)
Creatinine, Ser: 0.52 mg/dL — ABNORMAL LOW (ref 0.61–1.24)
GFR, Estimated: >60 mL/min (ref >60)
Glucose, Bld: 86 mg/dL (ref 70–99)
Potassium: 4.8 mmol/L (ref 3.5–5.1)
Sodium: 137 mmol/L (ref 135–145)
Total Bilirubin: 1.2 mg/dL (ref 0.3–1.2)
Total Protein: 6.7 g/dL (ref 6.5–8.1)

## 2021-03-20 LAB — CBC WITH DIFFERENTIAL/PLATELET
Abs Immature Granulocytes: 0.02 10*3/uL (ref 0.00–0.07)
Basophils Absolute: 0.1 10*3/uL (ref 0.0–0.1)
Basophils Relative: 1 %
Eosinophils Absolute: 0.2 10*3/uL (ref 0.0–0.5)
Eosinophils Relative: 4 %
HCT: 35.2 % — ABNORMAL LOW (ref 39.0–52.0)
Hemoglobin: 12 g/dL — ABNORMAL LOW (ref 13.0–17.0)
Immature Granulocytes: 0 %
Lymphocytes Relative: 41 %
Lymphs Abs: 1.9 10*3/uL (ref 0.7–4.0)
MCH: 32.3 pg (ref 26.0–34.0)
MCHC: 34.1 g/dL (ref 30.0–36.0)
MCV: 94.6 fL (ref 80.0–100.0)
Monocytes Absolute: 0.5 10*3/uL (ref 0.1–1.0)
Monocytes Relative: 11 %
Neutro Abs: 2 10*3/uL (ref 1.7–7.7)
Neutrophils Relative %: 43 %
Platelets: 264 10*3/uL (ref 150–400)
RBC: 3.72 MIL/uL — ABNORMAL LOW (ref 4.22–5.81)
RDW: 13.4 % (ref 11.5–15.5)
WBC: 4.7 10*3/uL (ref 4.0–10.5)
nRBC: 0 % (ref 0.0–0.2)

## 2021-03-20 LAB — MAGNESIUM: Magnesium: 2.1 mg/dL (ref 1.7–2.4)

## 2021-03-20 MED ORDER — OLANZAPINE 2.5 MG PO TABS
7.5000 mg | ORAL_TABLET | Freq: Every day | ORAL | Status: DC
  Administered 2021-03-20 – 2021-03-21 (×2): 7.5 mg via ORAL
  Filled 2021-03-20 (×2): qty 3

## 2021-03-20 MED ORDER — VALPROIC ACID 250 MG PO CAPS
500.0000 mg | ORAL_CAPSULE | Freq: Two times a day (BID) | ORAL | Status: DC
  Administered 2021-03-21 – 2021-04-15 (×50): 500 mg via ORAL
  Filled 2021-03-20 (×56): qty 2

## 2021-03-20 MED ORDER — TRAZODONE HCL 50 MG PO TABS: 50.0000 mg | ORAL_TABLET | Freq: Every day | ORAL | Status: DC

## 2021-03-20 MED ORDER — OLANZAPINE 2.5 MG PO TABS
10.0000 mg | ORAL_TABLET | Freq: Every day | ORAL | Status: DC
Start: 2021-03-20 — End: 2021-03-20

## 2021-03-20 MED ORDER — OLANZAPINE 10 MG IM SOLR
5.0000 mg | Freq: Three times a day (TID) | INTRAMUSCULAR | Status: DC | PRN
  Administered 2021-03-20 – 2021-03-22 (×4): 5 mg via INTRAMUSCULAR
  Filled 2021-03-20 (×7): qty 10

## 2021-03-20 NOTE — Progress Notes (Addendum)
?Progress Note ? ? ?PatientTravaris [name] PJK:[id number] DOB: [birth year] DOA: 01/05/2021     74 ?DOS: the patient was seen and examined on 03/20/2021 ?  ?Brief hospital course: ?Steve [name] is a 56 y.o. male with history of major depression, anxiety who was in a residential ETOH treatment center for last 5 months. He was brought to the ED on 12/20 from a residential ETOH treatment center for convalescence, combativeness and altered sensorium.  Per collateral history, patient was not acting typical for last few days. ? ?In the ED, patient was confused, combative. Labs showed AKI with creatinine of 3.27, rhabdomyolysis, abnormal LFTs, elevated WBC count at 23.7 and lactic acid level at 7.6. 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 Blood culture was sent, empiric antibiotic started.  IV fluid resuscitation started and patient was initially admitted to ICU. ?12/21, underwent LP which showed staph in CSF, felt to be contaminant.  EEG did not show any seizures ?12/24, HD catheter was placed but patient did not require HD as creatinine started to improve ?12/31, with clinical improvement, patient was transferred out of ICU to [facility]. ? ? ?Subjective: in recliner, no headache, no chest - abd pain, no SOB. ? ? ?Assessment and Plan: ? ?Cognitive impairment  2/2 Wernicke's encephalopathy and alcoholic dementia- (present on admission) -   Initial MRI revealed tiny acute infarct on the right but not significant enough to explain persistent cognitive and behavioral abnormalities. Also evidence of prior lacunar infarct right basal ganglia, was seen by Neuro. seen by both neurology and psychiatry.  Had was extremely sedated earlier in the week of 03/10/2021.  Psych and neurology were reconsulted.   ? ?Multiple medication changes were made.  Mentation has improved significantly on 03/14/2021 especially in the am,  continue present combination of trazodone, valproic acid, Zyprexa, Keppra, Prozac and Neurontin.  Meds adjusted further on 03/20/21. ? ?Physical deconditioning 2/2 ambulatory dysfunction- (present on admission) - needs SNF due to combination of severe deconditioning and poor cognition due to #1 above. ?  ?Witnessed seizure ([name]) - No apparent prior history of seizure disorder noting patient was not on AEDs prior to admission,  Unclear if withdrawal seizure or related to other substances, EEG unremarkable.   Continue Keppra at recommendation of neurology. ? ?Acute kidney injury with rhabdomyolysis -  Resolved. Nephrology was consulted with initial plans to initiate dialysis including placement of [facility] but renal function gradually improved and dialysis not needed and TDC removed. ? ?Moderate Protein calorie malnutrition ([name]) - on protein supplementation. ?  ?Possible DVT (deep venous thrombosis) ([name]) - Per critical care note, hemodialysis cath had a clot on it when it was removed.  UE duplex  12/31: no DVT.  Acting Xarelto. ? ?Right ventricular dysfunction- (present on admission) - Echo 1/2 with EF 60 to 65%.  Cannot rule out a small PFO this finding determined to be clinically insignificant given the patient was asymptomatic ? ?Aspiration pneumonia ([name])-resolved as of 03/04/2021 -   Completed a course of Zosyn ? ?Severe sepsis without septic shock ([name])  Resolved ?  ?Elevated transaminases at time of presentation - initially was due to sepsis but resolved and it was due to Zyprexa and it resolved after Zyprexa dose was reduced, currently on high doses of Zyprexa and valproic acid both.  Will cut down valproic acid and monitor, he has symptoms are really well controlled with Zyprexa but if  they remain elevated will cut down Zyprexa as well. ? ?Eczema - much better on steroid cream, continue.Outpt Derm follow up. ? ? ? ?Objective  ? ?Physical Exam: ? ?Vitals:  ? 03/19/21 1754 03/19/21 2100 03/20/21 0001 03/20/21 0523   ?BP: 100/69 109/75 104/81 110/78  ?Pulse: 86 86 78 66  ?Resp: 19 20 18 18   ?Temp: 97.6 ?F (36.4 ?C) 98.5 ?F (36.9 ?C) 98.1 ?F (36.7 ?C) (!) 97.5 ?F (36.4 ?C)  ?TempSrc: Oral Oral Oral Oral  ?SpO2: 100% 100% 100% 100%  ?Weight:      ?Height:      ? ? ?Awake Alert x1, No new F.N deficits, calmaffect ?[place].AT,PERRAL ?Supple Neck, No JVD,   ?Symmetrical Chest wall movement, Good air movement bilaterally, CTAB ?RRR,No Gallops, Rubs or new Murmurs,  ?+ve B.Sounds, Abd Soft, No tenderness,   ? ?Skin rash below ? ?03/20/21 ? ? ?03/15/21 ? ? ? ? ? ?No results for input(s): WBC, HGB, HCT, PLT, MCV, MCH, MCHC, RDW, LYMPHSABS, MONOABS, EOSABS, BASOSABS, BANDABS in the last 168 hours. ? ?Invalid input(s): NEUTRABS, BANDSABD ? ? ?Recent Labs  ?Lab 03/14/21 ?03/16/21  ?NA 140  ?K 4.3  ?CL 105  ?CO2 28  ?GLUCOSE 96  ?BUN 16  ?CREATININE 0.64  ?CALCIUM 9.5  ? ? ?DVT Prophylaxis  .Rivaroxaban (xarelto) tablet 10 mg , Rivaroxaban (xarelto) tablet 10 mg  ? ?Planned Discharge Destination:  ?Skilled nursing facility ? ?Medically stable: ?No-still titrating behavioral meds ? ?COVID vaccination status:  ?Unknown ? ?Consultants: ?Nephrology ?Psychiatry ?  ? ?Procedures: ?EEG ?Echocardiogram ?TDC ? ? Antibiotics: ?Unasyn x1 dose, cefepime x1 dose, ceftriaxone x1 dose, vancomycin x2 doses ? ?Scheduled Meds: ? ? amLODipine  2.5 mg Oral Daily  ? aspirin  325 mg Oral Daily  ? clonazePAM  0.5 mg Oral 2 times per day  ? clonazePAM  1 mg Oral QHS  ? docusate sodium  100 mg Oral BID  ? feeding supplement  237 mL Oral TID AC & HS  ? FLUoxetine  40 mg Oral Daily  ? folic acid  1 mg Oral Daily  ? hydrocortisone cream   Topical QID  ? levETIRAcetam  500 mg Oral BID  ? mouth rinse  15 mL Mouth Rinse BID  ? melatonin  10 mg Oral QHS  ? nicotine  21 mg Transdermal Daily  ? OLANZapine  15 mg Oral Q24H  ? OLANZapine  7.5 mg Oral Daily  ? polyethylene glycol  17 g Oral Daily  ? rivaroxaban  10 mg Oral Daily  ? selenium sulfide   Topical Daily  ? thiamine  250 mg Oral  Daily  ? valproic acid  750 mg Oral TID  ? ?Continuous Infusions: ?PRN Meds:.acetaminophen, OLANZapine zydis **OR** OLANZapine, ondansetron (ZOFRAN) IV, traZODone  ?Time spent: 15 minutes ? ?Signature ? ?[id number] M.D on 03/20/2021 at 7:36 AM   -  To page go to [email or web address]  ? ?  ? ? ?

## 2021-03-20 NOTE — Progress Notes (Signed)
Patient disoriented and impulsive, continues to get out of bed and pose high fall-risk despite non-pharmacologic and pharmacologic interventions. Notified that no safety sitters are available. Plan to use soft waist belt for now for patient's safety and discontinue as soon as possible.  ?

## 2021-03-20 NOTE — Progress Notes (Signed)
2058: Pt continues to get out of bed without staff. Does not understand safety concerns and physical limitations. No safety sitter available. Received order for posey belt. Applied. Notified son of restraint placement.  ? ?0033: Pt agitated and refused Valproic acid. Patient paranoid and accusing people of stealing. Talking about having to go to [place]. Continues to attempt to get out of bed and remove posey belt.  ? ?0314: PRN IM Zyprexa given.  ? ?0600: Pt restless and slept approximately 1.5 hrs after IM Zyprexa given overnight. RN had to sit at bedside for majority of night to ensure that pt was safe.  ?

## 2021-03-20 NOTE — Progress Notes (Signed)
Patient was given his sleep medication but it wasn't effective patient was very confused and impulsive made several attempts to get out of bed.had to bring him to the front to keep save. He slept about 2 hrs during to night. Ilean Skill LPN  ?

## 2021-03-21 LAB — COMPREHENSIVE METABOLIC PANEL
ALT: 78 U/L — ABNORMAL HIGH (ref 0–44)
AST: 53 U/L — ABNORMAL HIGH (ref 15–41)
Albumin: 3.5 g/dL (ref 3.5–5.0)
Alkaline Phosphatase: 40 U/L (ref 38–126)
Anion gap: 7 (ref 5–15)
BUN: 24 mg/dL — ABNORMAL HIGH (ref 6–20)
CO2: 29 mmol/L (ref 22–32)
Calcium: 9.2 mg/dL (ref 8.9–10.3)
Chloride: 102 mmol/L (ref 98–111)
Creatinine, Ser: 0.58 mg/dL — ABNORMAL LOW (ref 0.61–1.24)
GFR, Estimated: >60 mL/min (ref >60)
Glucose, Bld: 94 mg/dL (ref 70–99)
Potassium: 3.7 mmol/L (ref 3.5–5.1)
Sodium: 138 mmol/L (ref 135–145)
Total Bilirubin: 0.4 mg/dL (ref 0.3–1.2)
Total Protein: 6.8 g/dL (ref 6.5–8.1)

## 2021-03-21 LAB — PROTIME-INR
INR: 1.1 (ref 0.8–1.2)
Prothrombin Time: 14.4 seconds (ref 11.4–15.2)

## 2021-03-21 MED ORDER — TRAZODONE HCL 100 MG PO TABS
100.0000 mg | ORAL_TABLET | Freq: Every day | ORAL | Status: DC
  Administered 2021-03-21 – 2021-03-23 (×2): 100 mg via ORAL
  Filled 2021-03-21 (×2): qty 1

## 2021-03-21 MED ORDER — OLANZAPINE 2.5 MG PO TABS
15.0000 mg | ORAL_TABLET | Freq: Every day | ORAL | Status: DC
Start: 2021-03-21 — End: 2021-03-24
  Administered 2021-03-21 – 2021-03-23 (×3): 15 mg via ORAL
  Filled 2021-03-21 (×3): qty 6

## 2021-03-21 MED ORDER — OLANZAPINE 2.5 MG PO TABS
5.0000 mg | ORAL_TABLET | Freq: Two times a day (BID) | ORAL | Status: DC
  Administered 2021-03-21 – 2021-03-25 (×9): 5 mg via ORAL
  Filled 2021-03-21 (×10): qty 2

## 2021-03-21 NOTE — Progress Notes (Signed)
?Progress Note ? ? ?PatientPhineas [name] HVF:[id number] DOB: 12-Dec-1965 DOA: 01/05/2021     75 ?DOS: the patient was seen and examined on 03/21/2021 ?  ?Brief hospital course: ?Steve [name] is a 56 y.o. male with history of major depression, anxiety who was in a residential ETOH treatment center for last 5 months. He was brought to the ED on 12/20 from a residential ETOH treatment center for convalescence, combativeness and altered sensorium.  Per collateral history, patient was not acting typical for last few days. ? ?In the ED, patient was confused, combative. Labs showed AKI with creatinine of 3.27, rhabdomyolysis, abnormal LFTs, elevated WBC count at 23.7 and lactic acid level at 7.6. 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 Blood culture was sent, empiric antibiotic started.  IV fluid resuscitation started and patient was initially admitted to ICU. ?12/21, underwent LP which showed staph in CSF, felt to be contaminant.  EEG did not show any seizures ?12/24, HD catheter was placed but patient did not require HD as creatinine started to improve ?12/31, with clinical improvement, patient was transferred out of ICU to [facility]. ? ? ?Subjective: Patient in bed in no distress, calm this morning, denies any headache chest or abdominal pain.  Skin rash improving. ? ? ?Assessment and Plan: ? ?Cognitive impairment  2/2 Wernicke's encephalopathy and alcoholic dementia -   Initial MRI revealed tiny acute infarct on the right but not significant enough to explain persistent cognitive and behavioral abnormalities. Also evidence of prior lacunar infarct right basal ganglia, was seen by Neuro. seen by both neurology and psychiatry.  Had was extremely sedated earlier in the week of 03/10/2021.  Psych and neurology were reconsulted.   ? ?Multiple medication changes were made.  Mentation has improved significantly  on 03/14/2021 especially in the am, continue present combination of trazodone, valproic acid, Zyprexa, Keppra, Prozac and Neurontin.  Meds adjusted further on 03/20/21.  Since he is still having significant nighttime agitation will reinvolve psychiatry for input on 03/21/2021. ? ? ?Elevated transaminases at time of presentation - initially was due to sepsis but resolved and it was due to Zyprexa and it resolved after Zyprexa dose was reduced, is also noted to be on high doses of valproic acid, valproic acid dose dropped, LFTs are improving.  Still on moderate to high doses of Zyprexa.  Repeat CMP and INR on 03/24/2021 to monitor trend. ? ?Physical deconditioning 2/2 ambulatory dysfunction - needs SNF due to combination of severe deconditioning and poor cognition due to #1 above. ?  ?Witnessed seizure   - No apparent prior history of seizure disorder noting patient was not on AEDs prior to admission,  Unclear if withdrawal seizure or related to other substances, EEG unremarkable.   Continue Keppra at recommendation of neurology. ? ?Acute kidney injury with rhabdomyolysis -  Resolved. Nephrology was consulted with initial plans to initiate dialysis including placement of [facility] but renal function gradually improved and dialysis not needed and TDC removed. ? ?Moderate Protein calorie malnutrition  - on protein supplementation. ?  ?Possible DVT   - Per critical care note, hemodialysis cath had a clot on it when it was removed.  UE duplex  12/31: no DVT.  Acting Xarelto. ? ?Right ventricular dysfunction - Echo 1/2 with EF 60 to 65%.  Cannot rule out a small PFO this finding determined to be clinically insignificant given the patient was asymptomatic ? ?  Aspiration pneumonia - resolved as of 03/04/2021 -   Completed a course of Zosyn ? ?Severe sepsis without septic shock ([name])  Resolved ?  ?Eczema - much better on steroid cream, continue.Outpt Derm follow up. ? ?  ? ?Objective  ? ?Physical Exam: ? ?Vitals:  ? 03/20/21 2056 03/21/21  0045 03/21/21 0345 03/21/21 9622  ?BP: 97/65 106/79 (!) 114/95 105/75  ?Pulse: 85 92 75 89  ?Resp: 16 16 16 16   ?Temp: 97.7 ?F (36.5 ?C) (!) 97.5 ?F (36.4 ?C) 98.2 ?F (36.8 ?C) 97.7 ?F (36.5 ?C)  ?TempSrc: Oral Oral Oral Oral  ?SpO2: 100% 100% 100% 100%  ?Weight:      ?Height:      ? ? ?Awake Alert x1, No new F.N deficits, calm affect this am ?[place].AT,PERRAL ?Supple Neck, No JVD,   ?Symmetrical Chest wall movement, Good air movement bilaterally, CTAB ?RRR,No Gallops, Rubs or new Murmurs,  ?+ve B.Sounds, Abd Soft, No tenderness,   ? ? ?Skin rash below ? ?03/20/21 ? ? ?03/15/21 ? ? ? ? ? ?Recent Labs  ?Lab 03/20/21 ?1002  ?WBC 4.7  ?HGB 12.0*  ?HCT 35.2*  ?PLT 264  ?MCV 94.6  ?MCH 32.3  ?MCHC 34.1  ?RDW 13.4  ?LYMPHSABS 1.9  ?MONOABS 0.5  ?EOSABS 0.2  ?BASOSABS 0.1  ? ? ? ?Recent Labs  ?Lab 03/20/21 ?05/20/21 03/21/21 ?0333  ?NA 137 138  ?K 4.8 3.7  ?CL 103 102  ?CO2 21* 29  ?GLUCOSE 86 94  ?BUN 18 24*  ?CREATININE 0.52* 0.58*  ?CALCIUM 9.2 9.2  ?AST 86* 53*  ?ALT 82* 78*  ?ALKPHOS 38 40  ?BILITOT 1.2 0.4  ?ALBUMIN 3.5 3.5  ?MG 2.1  --   ?INR  --  1.1  ? ? ?DVT Prophylaxis  .Rivaroxaban (xarelto) tablet 10 mg , Rivaroxaban (xarelto) tablet 10 mg  ? ?Planned Discharge Destination:  ?Skilled nursing facility ? ?Medically stable: ?No-still titrating behavioral meds ? ?COVID vaccination status:  ?Unknown ? ?Consultants: ?Nephrology ?Psychiatry ?  ? ?Procedures: ?EEG ?Echocardiogram ?TDC ? ? Antibiotics: ?Unasyn x1 dose, cefepime x1 dose, ceftriaxone x1 dose, vancomycin x2 doses ? ?Scheduled Meds: ? ? amLODipine  2.5 mg Oral Daily  ? aspirin  325 mg Oral Daily  ? clonazePAM  0.5 mg Oral 2 times per day  ? docusate sodium  100 mg Oral BID  ? feeding supplement  237 mL Oral TID AC & HS  ? FLUoxetine  40 mg Oral Daily  ? folic acid  1 mg Oral Daily  ? hydrocortisone cream   Topical QID  ? levETIRAcetam  500 mg Oral BID  ? mouth rinse  15 mL Mouth Rinse BID  ? melatonin  10 mg Oral QHS  ? nicotine  21 mg Transdermal Daily  ? OLANZapine   15 mg Oral Q24H  ? OLANZapine  7.5 mg Oral Daily  ? polyethylene glycol  17 g Oral Daily  ? rivaroxaban  10 mg Oral Daily  ? selenium sulfide   Topical Daily  ? thiamine  250 mg Oral Daily  ? traZODone  100 mg Oral Q0600  ? valproic acid  500 mg Oral BID  ? ?Continuous Infusions: ?PRN Meds:.acetaminophen, [DISCONTINUED] OLANZapine zydis **OR** OLANZapine, ondansetron (ZOFRAN) IV  ?Time spent: 15 minutes ? ?Signature ? ?05/21/21 M.D on 03/21/2021 at 8:37 AM   -  To page go to [email or web address]  ? ?  ? ? ?

## 2021-03-21 NOTE — Progress Notes (Deleted)
No one to contact ? ?

## 2021-03-21 NOTE — Consult Note (Signed)
[facility] Psychiatry Consult   Reason for Consult:Severe night time agitation Referring Physician:  Beverly Sessions Patient Identification: Steve [name] MRN:  [id number] Principal Diagnosis: Wernicke's encephalopathy Diagnosis:  Principal Problem:   Cognitive impairment  2/2 Wernicke's encephalopathy and alcoholic dementia Active Problems:   Skin abnormalities/perineal MASD   Physical deconditioning 2/2 ambulatory dysfunction   Right ventricular dysfunction   Witnessed seizure ([place])   Protein calorie malnutrition ([place])   Ambulatory dysfunction   Eczema   Pressure injury of skin  Total Time Spent in Direct Patient Care:  I personally spent 50 minutes on the unit in direct patient care. The direct patient care time included face-to-face time with the patient, reviewing the patient's chart, communicating with other professionals, and coordinating care. Greater than 50% of this time was spent in counseling or coordinating care with the patient regarding goals of hospitalization, psycho-education, and discharge planning needs.   HPI:  Steve [name] is a 56 y.o. male patient with a past psychiatric history significant for MDD, anxiety who initially presented to ED on 12/20 for full body seizure, AMS and combativeness.  Per collateral patient was not acting typical.  Patient was found to have rhabdomyolysis leading to uremia and encephalopathy, and abnormal LFTs.  Initial psych consult MDD affecting care vs. Malingering behavior.  Patient is on Prozac, BuSpar, Neurontin, Seroquel (not a home medication).  Seroquel was started in the hospital for encephalopathy.  Patient home medication include Neurontin 400 mg 3 times daily, Vistaril 50 mg every 4 hours needed for anxiety, Prozac 40 mg daily and trazodone 50 mg at bedtime.   Labs reviewed from today-CMP (sodium 138), LFT's trending down; PT-INR WNL 3/4 CBC with low H/H and normalizing MCHC; Magnesium now WNL  Per nursing notes  overnight: 0033: Pt agitated and refused Valproic acid. Patient paranoid and accusing people of stealing. Talking about having to go to [place]. Continues to attempt to get out of bed and remove posey belt.  9449: PRN IM Zyprexa given.  0600: Pt restless and slept approximately 1.5 hrs after IM Zyprexa given overnight. RN had to sit at bedside for majority of night to ensure that pt was safe.  Met with nursing staff today who states that he has been redirectable.  They note that his agitation symptoms do seem to worsen overnight.  He has required Zyprexa IM injections the past 2 nights for increased agitation.   Subjective: "It is 2024"  Patient is seen today.  Patient is poor historian with limited interaction.  Denies complaints today.  He denies any suicidal ideation.  He denies any auditory or visual hallucinations.  He states he has been taking his medications.  He denies depression or anxiety today.  He denies any complaints or concerns.  He denies any side effects.  Patient makes minimal eye contact intermittently throughout exam.  He requests time to rest.  From initial consult: He reports that he drinks alcohol occasionally but does not use any illicit drugs.  He was compliant with his psych medications.  Patient was at sober living of [place] for last 1 month.  He has a son who lives in [place].   Past Psychiatric History: MDD, anxiety.  Vistaril 50 mg every 4 hours needed for anxiety, Prozac 40 mg daily and trazodone 50 mg at bedtime.  Risk to Self:   Risk to Others:   Prior Inpatient Therapy:   Prior Outpatient Therapy:    Past Medical History: History reviewed. No pertinent past medical history. History reviewed.  No pertinent surgical history. Family History: History reviewed. No pertinent family history.  Family Psychiatric  History: None noted  Social History:  Social History   Substance and Sexual Activity  Alcohol Use None     Social History   Substance and Sexual  Activity  Drug Use Not on file    Social History   Socioeconomic History   Marital status: Unknown    Spouse name: Not on file   Number of children: Not on file   Years of education: Not on file   Highest education level: Not on file  Occupational History   Not on file  Tobacco Use   Smoking status: Every Day    Packs/day: 0.50    Types: Cigarettes   Smokeless tobacco: Current  Substance and Sexual Activity   Alcohol use: Not on file   Drug use: Not on file   Sexual activity: Not on file  Other Topics Concern   Not on file  Social History Narrative   Not on file   Social Determinants of Health   Financial Resource Strain: Not on file  Food Insecurity: Not on file  Transportation Needs: Not on file  Physical Activity: Not on file  Stress: Not on file  Social Connections: Not on file   Additional Social History:   Essentially homeless   Allergies:  No Known Allergies  Labs:  Results for orders placed or performed during the hospital encounter of 01/05/21 (from the past 48 hour(s))  Comprehensive metabolic panel     Status: Abnormal   Collection Time: 03/20/21  6:53 AM  Result Value Ref Range   Sodium 137 135 - 145 mmol/L   Potassium 4.8 3.5 - 5.1 mmol/L    Comment: SLIGHT HEMOLYSIS   Chloride 103 98 - 111 mmol/L   CO2 21 (L) 22 - 32 mmol/L   Glucose, Bld 86 70 - 99 mg/dL    Comment: Glucose reference range applies only to samples taken after fasting for at least 8 hours.   BUN 18 6 - 20 mg/dL   Creatinine, Ser 0.52 (L) 0.61 - 1.24 mg/dL   Calcium 9.2 8.9 - 10.3 mg/dL   Total Protein 6.7 6.5 - 8.1 g/dL   Albumin 3.5 3.5 - 5.0 g/dL   AST 86 (H) 15 - 41 U/L   ALT 82 (H) 0 - 44 U/L   Alkaline Phosphatase 38 38 - 126 U/L   Total Bilirubin 1.2 0.3 - 1.2 mg/dL   GFR, Estimated >60 >60 mL/min    Comment: (NOTE) Calculated using the CKD-EPI Creatinine Equation (2021)    Anion gap 13 5 - 15    Comment: Performed at [facility], [place] [address],  [place], [place] [address]  Magnesium     Status: None   Collection Time: 03/20/21  6:53 AM  Result Value Ref Range   Magnesium 2.1 1.7 - 2.4 mg/dL    Comment: Performed at [facility] [address], [place], [place] [address]  CBC with Differential/Platelet     Status: Abnormal   Collection Time: 03/20/21 10:02 AM  Result Value Ref Range   WBC 4.7 4.0 - 10.5 K/uL   RBC 3.72 (L) 4.22 - 5.81 MIL/uL   Hemoglobin 12.0 (L) 13.0 - 17.0 g/dL   HCT 35.2 (L) 39.0 - 52.0 %   MCV 94.6 80.0 - 100.0 fL   MCH 32.3 26.0 - 34.0 pg   MCHC 34.1 30.0 - 36.0 g/dL   RDW  13.4 11.5 - 15.5 %   Platelets 264 150 - 400 K/uL   nRBC 0.0 0.0 - 0.2 %   Neutrophils Relative % 43 %   Neutro Abs 2.0 1.7 - 7.7 K/uL   Lymphocytes Relative 41 %   Lymphs Abs 1.9 0.7 - 4.0 K/uL   Monocytes Relative 11 %   Monocytes Absolute 0.5 0.1 - 1.0 K/uL   Eosinophils Relative 4 %   Eosinophils Absolute 0.2 0.0 - 0.5 K/uL   Basophils Relative 1 %   Basophils Absolute 0.1 0.0 - 0.1 K/uL   Immature Granulocytes 0 %   Abs Immature Granulocytes 0.02 0.00 - 0.07 K/uL    Comment: Performed at [facility] [address], [place], [place] [address]  Comprehensive metabolic panel     Status: Abnormal   Collection Time: 03/21/21  3:33 AM  Result Value Ref Range   Sodium 138 135 - 145 mmol/L   Potassium 3.7 3.5 - 5.1 mmol/L   Chloride 102 98 - 111 mmol/L   CO2 29 22 - 32 mmol/L   Glucose, Bld 94 70 - 99 mg/dL    Comment: Glucose reference range applies only to samples taken after fasting for at least 8 hours.   BUN 24 (H) 6 - 20 mg/dL   Creatinine, Ser 0.58 (L) 0.61 - 1.24 mg/dL   Calcium 9.2 8.9 - 10.3 mg/dL   Total Protein 6.8 6.5 - 8.1 g/dL   Albumin 3.5 3.5 - 5.0 g/dL   AST 53 (H) 15 - 41 U/L   ALT 78 (H) 0 - 44 U/L   Alkaline Phosphatase 40 38 - 126 U/L   Total Bilirubin 0.4 0.3 - 1.2 mg/dL   GFR, Estimated >60 >60 mL/min    Comment: (NOTE) Calculated using the CKD-EPI Creatinine Equation (2021)    Anion  gap 7 5 - 15    Comment: Performed at [facility], [place] [address], [place], [place] [address]  Protime-INR     Status: None   Collection Time: 03/21/21  3:33 AM  Result Value Ref Range   Prothrombin Time 14.4 11.4 - 15.2 seconds   INR 1.1 0.8 - 1.2    Comment: (NOTE) INR goal varies based on device and disease states. Performed at [facility], [place] [address] [place], [place] [address]     Current Facility-Administered Medications  Medication Dose Route Frequency Provider Last Rate Last Admin   acetaminophen (TYLENOL) tablet 650 mg  650 mg Oral Q4H PRN Vernelle Emerald, MD   650 mg at 03/20/21 0725   amLODipine (NORVASC) tablet 2.5 mg  2.5 mg Oral Daily Dahal, Binaya, MD   2.5 mg at 03/21/21 1423   aspirin tablet 325 mg  325 mg Oral Daily Greta Doom, MD   325 mg at 03/21/21 0912   clonazePAM (KLONOPIN) tablet 0.5 mg  0.5 mg Oral 2 times per day Thurnell Lose, MD   0.5 mg at 03/21/21 9532   docusate sodium (COLACE) capsule 100 mg  100 mg Oral BID Spero Geralds, MD   100 mg at 03/21/21 1030   feeding supplement (ENSURE ENLIVE / ENSURE PLUS) liquid 237 mL  237 mL Oral TID AC & HS Samella Parr, NP   237 mL at 03/21/21 1030   FLUoxetine (PROZAC) capsule 40 mg  40 mg Oral Daily Dahal, Marlowe Aschoff, MD   40 mg at 02/33/43 5686   folic acid (FOLVITE) tablet 1 mg  1 mg Oral Daily Eulogio Bear U, DO   1 mg at 03/21/21 6962   hydrocortisone cream 1 %   Topical QID Samella Parr, NP   Given at 03/21/21 1420   levETIRAcetam (KEPPRA) 100 MG/ML solution 500 mg  500 mg Oral BID Shelly Coss, MD   500 mg at 03/21/21 0912   MEDLINE mouth rinse  15 mL Mouth Rinse BID Olalere, Adewale A, MD   15 mL at 03/21/21 1000   melatonin tablet 10 mg  10 mg Oral QHS Jonetta Osgood, MD   10 mg at 03/20/21 2020   nicotine (NICODERM CQ - dosed in mg/24 hours) patch 21 mg  21 mg Transdermal Daily Dahal, Binaya, MD   21 mg at 03/21/21 0920   OLANZapine (ZYPREXA) injection 5 mg  5  mg Intramuscular Q8H PRN Thurnell Lose, MD   5 mg at 03/21/21 0314   OLANZapine (ZYPREXA) tablet 15 mg  15 mg Oral Q24H Samella Parr, NP   15 mg at 03/20/21 1627   OLANZapine (ZYPREXA) tablet 7.5 mg  7.5 mg Oral Daily Lala Lund K, MD   7.5 mg at 03/21/21 0915   ondansetron (ZOFRAN) injection 4 mg  4 mg Intravenous Q8H PRN Margaretmary Lombard, MD   4 mg at 01/15/21 0208   polyethylene glycol (MIRALAX / GLYCOLAX) packet 17 g  17 g Oral Daily Spero Geralds, MD   17 g at 03/21/21 0911   rivaroxaban (XARELTO) tablet 10 mg  10 mg Oral Daily Samella Parr, NP   10 mg at 03/21/21 9528   selenium sulfide (SELSUN) 1 % shampoo   Topical Daily Samella Parr, NP   Given at 03/21/21 1000   thiamine tablet 250 mg  250 mg Oral Daily Jonetta Osgood, MD   250 mg at 03/21/21 0912   traZODone (DESYREL) tablet 100 mg  100 mg Oral Q0600 Thurnell Lose, MD       valproic acid (DEPAKENE) 250 MG capsule 500 mg  500 mg Oral BID Thurnell Lose, MD   500 mg at 03/21/21 4132    Musculoskeletal: Strength & Muscle Tone:  Not assessed Gait & Station:  Deferred Patient leans: N/A, patient had been            Psychiatric Specialty Exam:  Presentation  General Appearance: Casual; Appropriate for Environment  Eye Contact:Minimal  Speech:-- (Varies between mumbling and clear)  Speech Volume:Decreased  Handedness:No data recorded  Mood and Affect  Mood:Anxious; Depressed  Affect:Congruent   Thought Process  Thought Processes:-- (Organized)  Descriptions of Associations:Intact  Orientation:-- (States the year is 2024, does not know the month or date.  Oriented to person and place.)  Thought Content:Logical  History of Schizophrenia/Schizoaffective disorder:No data recorded Duration of Psychotic Symptoms:No data recorded Hallucinations:Hallucinations: None   Ideas of Reference:None  Suicidal Thoughts:Suicidal Thoughts: No   Homicidal Thoughts:Homicidal Thoughts:  No    Sensorium  Memory:Immediate Fair  Judgment:Impaired  Insight:Shallow   Executive Functions  Concentration:Fair  Attention Span:Fair  [place]   Psychomotor Activity  Psychomotor Activity:Psychomotor Activity: Decreased    Assets  Assets:Resilience   Sleep  Sleep:Sleep: Fair    Physical Exam: Physical Exam Review of Systems  Reason unable to perform ROS: Due to limited Pt interation.  Psychiatric/Behavioral:  Negative for depression, hallucinations and suicidal ideas. The patient is not nervous/anxious and does not have insomnia.   Blood pressure 105/76, pulse 84,  temperature 97.9 F (36.6 C), temperature source Oral, resp. rate 16, height 5' 8"  (1.727 m), weight 64.3 kg, SpO2 100 %. Body mass index is 21.55 kg/m.  Treatment Plan Summary: Simmie Camerer is a 56 y.o. male patient with past psychiatric history significant for MDD, anxiety who initially presented to ED on 12/20 for full body seizure, AMS and combativeness.  Per collateral patient was not acting typical.  Patient was found to have rhabdomyolysis leading to uremia and encephalopathy, and abnormal LFTs.  Psych consult MDD affecting care ?Malingering behavior.   Patient is very guarded and selectively mute. He only spoke when he wanted to give some information.  There  may be likely an element of secondary gain. Will need more collateral information from son and other sources. CSW informed.  Limited collateral information available at this time. TOC involved per CSW.  Recommendations  -Continue Prozac 40 mg daily for depression - Continue Klonopin 0.5 mg twice daily for anxiety Recommend changing Zyprexa to 5 mg every morning, 5 mg with evening meal and 15 mg at bedtime to allow for less daytime sedation and more affect overnight.  Orders placed - Consider changing to Zyprexa Zydis for ease of administration if needed -Serial ECG monitoring of QTc  interval-  order placed for 03/22/2021. -Continue Depakote 500 mg twice daily.  Recommend rechecking valproic acid level, as there may be room for dose increase.  Lab order placed for 03/22/2021. - Continue trazodone 100 mg nightly - Continue melatonin at bedtime for sleep  Thank you for this consult.  Psychiatry will continue to follow and see patient on Monday 03/22/2021.  Disposition: Patient does not meet criteria for psychiatric inpatient admission.   Lavella Hammock, MD 03/21/2021 3:23 PM

## 2021-03-22 DIAGNOSIS — R451 Restlessness and agitation: Secondary | ICD-10-CM

## 2021-03-22 DIAGNOSIS — F1027 Alcohol dependence with alcohol-induced persisting dementia: Secondary | ICD-10-CM

## 2021-03-22 LAB — VALPROIC ACID LEVEL: Valproic Acid Lvl: 37 ug/mL — ABNORMAL LOW (ref 50.0–100.0)

## 2021-03-22 MED ORDER — OLANZAPINE 10 MG IM SOLR
5.0000 mg | Freq: Three times a day (TID) | INTRAMUSCULAR | Status: DC | PRN
  Administered 2021-03-23 – 2021-04-01 (×5): 5 mg via INTRAMUSCULAR
  Filled 2021-03-22 (×10): qty 10

## 2021-03-22 MED ORDER — STERILE WATER FOR INJECTION IJ SOLN
INTRAMUSCULAR | Status: AC
  Filled 2021-03-22: qty 10

## 2021-03-22 NOTE — Progress Notes (Signed)
Occupational Therapy Treatment ?Patient Details ?Name: Steve [name] ?MRN: [id number] ?DOB: [birth year] ?Today's Date: 03/22/2021 ? ? ?History of present illness Pt is a 56 y.o. male admitted from group home for convalescence, combativeness and altered sensorium on 01/05/21 with witnessed seizure, AMS. 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 EEG unremarkable. LP on 12/21 showed staph in CSF, felt to be contaminant. Workup for AKI, rhabdomyolysis. Course complicated by c/o L foot pain; imaging negative for acute injury. Pt remains hospitalized due to difficult to place. Pt with psych consult who felt no need for inpt psych. No PMH in chart. ?  ?OT comments ? Patient received in supine in bed with posey restraint. Patient was able to get to EOB with verbal cues and was able to donn socks. Patient performed grooming tasks standing at sink with frequent cues to stay on task due to distracted by items on sink and in room. Patient continues to show poor safety and requires assistance to stay on tasks Acute OT to continue to follow.   ? ?Recommendations for follow up therapy are one component of a multi-disciplinary discharge planning process, led by the attending physician.  Recommendations may be updated based on patient status, additional functional criteria and insurance authorization. ?   ?Follow Up Recommendations ? Skilled nursing-short term rehab (<3 hours/day)  ?  ?Assistance Recommended at Discharge Frequent or constant Supervision/Assistance  ?Patient can return home with the following ? A little help with walking and/or transfers;A little help with bathing/dressing/bathroom;Assistance with cooking/housework;Assist for transportation;Help with stairs or ramp for entrance ?  ?Equipment Recommendations ? None recommended by OT  ?  ?Recommendations for Other Services   ? ?  ?Precautions /  Restrictions Precautions ?Precautions: Fall ?Precaution Comments: Recruitment consultant ?Restrictions ?Weight Bearing Restrictions: No ?LLE Weight Bearing: Weight bearing as tolerated  ? ? ?  ? ?Mobility Bed Mobility ?Overal bed mobility: Needs Assistance ?Bed Mobility: Supine to Sit, Sit to Supine ?  ?  ?Supine to sit: Supervision, HOB elevated ?Sit to supine: Supervision ?  ?General bed mobility comments: impulsive with supine to sitting, wearing a posey belt in bed ?  ? ?Transfers ?Overall transfer level: Needs assistance ?Equipment used: 1 person hand held assist ?Transfers: Sit to/from Stand ?Sit to Stand: Min assist ?  ?  ?  ?  ?  ?General transfer comment: min assist for safety due to unsafe balance ?  ?  ?Balance Overall balance assessment: Needs assistance ?Sitting-balance support: Feet supported, No upper extremity supported ?Sitting balance-Leahy Scale: Good ?  ?  ?Standing balance support: Single extremity supported, No upper extremity supported, Reliant on assistive device for balance, During functional activity ?Standing balance-Leahy Scale: Poor ?Standing balance comment: reliant on external/UE support ?  ?  ?  ?  ?  ?  ?  ?  ?  ?  ?  ?   ? ?ADL either performed or assessed with clinical judgement  ? ?ADL Overall ADL's : Needs assistance/impaired ?  ?  ?Grooming: Wash/dry hands;Wash/dry face;Min guard;Standing ?Grooming Details (indicate cue type and reason): requried cues to stay on task, easily distracted by other items on sink ?  ?  ?  ?  ?  ?  ?Lower Body Dressing: Supervision/safety;Sitting/lateral leans ?Lower Body Dressing Details (indicate cue type and reason): donned socks seated on EOB ?  ?  ?  ?  ?  ?  ?  ?General  ADL Comments: easily distracted during self care ?  ? ?Extremity/Trunk Assessment   ?  ?  ?  ?  ?  ? ?Vision   ?  ?  ?Perception   ?  ?Praxis   ?  ? ?Cognition Arousal/Alertness: Awake/alert ?Behavior During Therapy: Impulsive ?Overall Cognitive Status: Impaired/Different from  baseline ?Area of Impairment: Attention, Following commands, Awareness, Safety/judgement, Problem solving, Orientation, Memory ?  ?  ?  ?  ?  ?  ?  ?  ?Orientation Level: Disoriented to, Place, Time, Situation ?Current Attention Level: Sustained ?Memory: Decreased short-term memory ?Following Commands: Follows one step commands consistently, Follows one step commands with increased time ?Safety/Judgement: Decreased awareness of safety, Decreased awareness of deficits ?Awareness: Intellectual ?Problem Solving: Difficulty sequencing, Requires verbal cues ?General Comments: frequent cues for safety and to stay on task ?  ?  ?   ?Exercises   ? ?  ?Shoulder Instructions   ? ? ?  ?General Comments    ? ? ?Pertinent Vitals/ Pain       Pain Assessment ?Pain Assessment: Faces ?Faces Pain Scale: No hurt ?Pain Intervention(s): Monitored during session ? ?Home Living   ?  ?  ?  ?  ?  ?  ?  ?  ?  ?  ?  ?  ?  ?  ?  ?  ?  ?  ? ?  ?Prior Functioning/Environment    ?  ?  ?  ?   ? ?Frequency ? Min 1X/week  ? ? ? ? ?  ?Progress Toward Goals ? ?OT Goals(current goals can now be found in the care plan section) ? Progress towards OT goals: Progressing toward goals ? ?Acute Rehab OT Goals ?Patient Stated Goal: get better ?OT Goal Formulation: With patient ?Time For Goal Achievement: 03/25/21 ?Potential to Achieve Goals: Fair ?ADL Goals ?Pt Will Perform Grooming: with supervision;standing ?Pt Will Perform Lower Body Bathing: with supervision;sitting/lateral leans ?Pt Will Perform Lower Body Dressing: with supervision;sit to/from stand ?Pt Will Transfer to Toilet: with supervision;ambulating ?Pt Will Perform Toileting - Clothing Manipulation and hygiene: with modified independence;sitting/lateral leans ?Additional ADL Goal #1: Pt will demonstrate increased attention to task, requiring less than 3 vc during a 5 min ADL task  ?Plan Discharge plan remains appropriate   ? ?Co-evaluation ? ? ?   ?  ?  ?  ?  ? ?  ?AM-PAC OT "6 Clicks" Daily  Activity     ?Outcome Measure ? ? Help from another person eating meals?: A Little ?Help from another person taking care of personal grooming?: A Little ?Help from another person toileting, which includes using toliet, bedpan, or urinal?: A Little ?Help from another person bathing (including washing, rinsing, drying)?: A Little ?Help from another person to put on and taking off regular upper body clothing?: A Little ?Help from another person to put on and taking off regular lower body clothing?: A Little ?6 Click Score: 18 ? ?  ?End of Session Equipment Utilized During Treatment: Gait belt ? ?OT Visit Diagnosis: Unsteadiness on feet (R26.81);Muscle weakness (generalized) (M62.81);Low vision, both eyes (H54.2);Ataxia, unspecified (R27.0) ?  ?Activity Tolerance Patient tolerated treatment well ?  ?Patient Left in chair;with call bell/phone within reach;with chair alarm set;with nursing/sitter in room;with restraints reapplied ?  ?Nurse Communication Mobility status ?  ? ?   ? ?Time: 7858-8502 ?OT Time Calculation (min): 14 min ? ?Charges: OT General Charges ?$OT Visit: 1 Visit ?OT Treatments ?$Self Care/Home Management : 8-22 mins ? ?Alfonse Flavors,  OTA ?Acute Rehabilitation Services  ?Pager [phone] ?Office [phone] ? ? ?Dewain Penning ?03/22/2021, 1:45 PM ?

## 2021-03-22 NOTE — Progress Notes (Addendum)
11:40am: ?CSW received return call from [place] at [facility] who states the report was accepted. ? ?9:50am: ?CSW attempted to reach patient's son Steve [name] again without success - no voicemail option available. ? ?CSW spoke with Patsy Lager at [facility] to make a APS report. ? ?Edwin Dada, MSW, LCSW ?Transitions of Care  Clinical Social Worker II ?[phone] ? ? ?

## 2021-03-22 NOTE — Consult Note (Signed)
Brief Psychiatry Consult Note  ?The patient was last seen by the psychiatry service on 3/6 with medication changes made to target nighttime aggression. Was planning to see pt tomorrow 3/8, however noticed that he had gotten IM olanzapine at 15:08 and walked over to unit around 15:30 to assess. In speaking to both sitter and nurse, pt was not agitated prior to this but was restless moving around in bed - was reportedly not agitated, yelling, etc.. He had removed a condom catheter at some point earlier in the day but allowed it to be replaced prior to getting the zyprexa. Had brief discussion with nurse on appropriate zyprexa use - hard to judge effect of med regimen on pt behavior when getting PRNs for reasons other than agitation. On brief exam pt was confused and only partially oriented to situation. Stated he had shaved last night (this is not true on brief visual inspection of face - when confronted stated that his beard "grew very fast") and generally made several statements c/w confabulations. Thought year was 10. ? ?- have updated indication for zyprexa to include "agitation with threat of harm to self or others only" ?- standing meds last updated 3/5, need to observe behavior for multiple days to evaluate ?- likely need to dc divalproex or inc to therapeutic levels (recent elevation in transaminases) ?- full exam tomorrow. ? ? ?Florence Yeung A Sieanna Vanstone ? ?

## 2021-03-22 NOTE — Progress Notes (Signed)
Speech Language Pathology Treatment:    ?Patient Details ?Name: Abdallah Slabach ?MRN: [id number] ?DOB: [birth year] ?Today's Date: 03/22/2021 ?Time: 1540-0867 ?SLP Time Calculation (min) (ACUTE ONLY): 23 min ? ?Assessment / Plan / Recommendation ?Clinical Impression ? Pt was seen for cognitive-linguistic treatment. Session was completed in the afternoon and SLP suspects that this may have impacted pt's performance. He demonstrated 67% accuracy with delayed (30-45 second) recall of 2 photos. He required significant cueing to remain on task and to provide details during a sequencing activity. He was able to demonstrate use of the call bell during a simulated problem-solving activity, but still continued to attempt to get out of bed without requesting assistance. He completed the an app-based attention task with 66% accuracy given cues. He exhibited an average response time of 61.18 seconds with a range of 21.86-159.69 seconds. The specific attention task was modified since pt reported baseline difficulty differentiating left and right. SLP will continue to follow pt.   ?  ?HPI HPI: Pt is a 56 y.o. male admitted from group home (substance abuse rehab?) on 01/05/21 with witnessed seizure, AMS. 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 EEG unremarkable. LP on 12/21 showed staph in CSF, felt to be contaminant. Workup for AKI, rhabdomyolysis. Dx Uremic encephalopathy. Course complicated by c/o L foot pain; imaging negative for acute injury. Pt remains hospitalized due to difficulty with placement. SLP evaluation on 01/26/21: 9/27 on Mini Mental State Exam, SLUMS 02/03/21: 2/30; SLUMS 1/27: 1/30. Psych consulted and as of 1/16, pt did not meet criteria for inpatient psychiatric admission. ?  ?   ?SLP Plan ? Continue with current plan of care ? ?  ?  ?Recommendations for follow up therapy are one component of a  multi-disciplinary discharge planning process, led by the attending physician.  Recommendations may be updated based on patient status, additional functional criteria and insurance authorization. ?  ? ?Recommendations  ?   ?   ?    ?   ? ? ? ? Oral Care Recommendations: Oral care BID ?Follow Up Recommendations: Skilled nursing-short term rehab (<3 hours/day) ?Assistance recommended at discharge: Frequent or constant Supervision/Assistance ?SLP Visit Diagnosis: Cognitive communication deficit (R41.841);Attention and concentration deficit ?Attention and concentration deficit following: Other cerebrovascular disease ?Plan: Continue with current plan of care ? ? ? ? ?  ?  ?Mckinzi Eriksen I. [name], MS, CCC-SLP ?Acute Rehabilitation Services ?Office number [phone] ?Pager [phone] ? ? ?[name] ? ?03/22/2021, 4:14 PM ? ? ? ? ?

## 2021-03-22 NOTE — Progress Notes (Signed)
Progress Note   Patient: Steve [name] HCW:[id number] DOB: 15-Feb-1965 DOA: 01/05/2021     76 DOS: the patient was seen and examined on 03/22/2021   Brief hospital course: Steve [name] is a 56 y.o. male with history of major depression, anxiety who was in a residential ETOH treatment center for last 5 months. He was brought to the ED on 12/20 from a residential ETOH treatment center for convalescence, combativeness and altered sensorium.  Per collateral history, patient was not acting typical for last few days.  In the ED, patient was confused, combative. Labs showed AKI with creatinine of 3.27, rhabdomyolysis, abnormal LFTs, elevated WBC count at 23.7 and lactic acid level at 7.6. 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 Blood culture was sent, empiric antibiotic started.  IV fluid resuscitation started and patient was initially admitted to ICU. 12/21, underwent LP which showed staph in CSF, felt to be contaminant.  EEG did not show any seizures 12/24, HD catheter was placed but patient did not require HD as creatinine started to improve 12/31, with clinical improvement, patient was transferred out of ICU to [facility]. 1/18 Cognitive evaluations: 2 on the SLUMS, a 9 on the MMSE, and a 0 on medi-cog and short blessed test  Assessment and Plan: Left  LE pain Recurrent XR neg Plan is to use simple ankle support for ambulation  * Cognitive impairment  2/2 Wernicke's encephalopathy and alcoholic dementia Initial MRI revealed tiny acute infarct on the right but not significant enough to explain persistent cognitive and behavioral abnormalities. Also evidence of prior lacunar infarct right basal ganglia Cognitive screening revealed significant cognitive deficits Continue Prozac, Depakote, Klonopin and Zyprexa  Cont HS Melatonin Over the WE (3/4-3/5) had worsened nocturnal behaviors  prompting re imvolvement of psych team- meds adjusted Continue sitter -cont posey belt when sitter not available. Depakote decreased to 500 mg BID, Zyprexa adjusted to 5 mg 2x daily during daytime hours, trazadone increased to 100 mg HS and HS dose of Klonopin dc'd LFTs back up; QTC stable at 464 ms (3/6); platelets are normal w/o thrombocytopneic changes; VPA is subtherapeutic at 37 with a normal albumin Continue thiamine-previously had been given high-dose IV thiamine earlier in the hospitalization with some improvement in cognition     Witnessed seizure ([name]) No apparent prior history of seizure disorder noting patient was not on AEDs prior to admission Unclear if withdrawal seizure or related to other substances EEG unremarkable.   Continue Keppra at recommendation of neurology  Acute kidney injury with rhabdomyolysis-resolved as of 03/17/2021 Resolved Creatinine peaked at 7.48 on 12/26.  Nephrology consulted with initial plans to initiate dialysis including placement of [facility] but renal function gradually improved and dialysis not needed and [facility] removed  Eczema Patient has multiple areas on extremities consistent with atopic dermatitis.  Also has significantly dry scalp with flaking skin. Continue topical steroids Selsun shampoo to hair and scalp As of 3/1 significant decrease in pruritus and redness of lesions on extremities-lotion primarily being applied to upper extremities therefore we will add application to lower extremity   2/27   2/27   3/5     Aspiration pneumonia ([name])-resolved as of 03/04/2021 Completed a course of Zosyn  Possible DVT (deep venous thrombosis) ([name])-resolved as of 03/17/2021 Per critical care note, hemodialysis cath had a clot on it when it was removed.  UE duplex  12/31: no DVT.  Elevated transaminases at time of presentation-resolved as of 03/04/2021 Likely secondary to sepsis like physiology at presentation His AST and ALT were significantly  elevated to over 700s on admission.  Re emergence mild elevation LFTs-likely due to Zyprexa and Depakote Due to oversedation Zyprexa dose decreased LFTs have normalized with decrease in Zyprexa dose. 2/16 valproic acid level 55   Right ventricular dysfunction Echo 1/2 with EF 60 to 65%.  Cannot rule out a small PFO this finding determined to be clinically insignificant given the patient was asymptomatic  Physical deconditioning 2/2 ambulatory dysfunction Influenced by patient's poor cognition as well as prior heavy alcohol abuse and malnutrition before admission Therapy continues to recommend SNF placement due to level of assist required with ADLs and IADLs as well as ongoing poor cognition Mobility continues to be influenced not only by cognition but by ambulatory dysfunction/gait and imbalance issues 2/23 despite significant improvement in alertness ambulatory dysfunction has not improved so therefore not influenced by medication  A physical therapy consult is indicated based on the patients mobility assessment.   Mobility Assessment (last 72 hours)     Mobility Assessment     Row Name 03/15/21 1700 03/15/21 1200 03/14/21 1200 03/14/21 0952 03/14/21 0830   Does patient have an order for bedrest or is patient medically unstable -- No - Continue assessment -- No - Continue assessment No - Continue assessment   What is the highest level of mobility based on the progressive mobility assessment? Level 4 (Walks with assist in room) - Balance while marching in place and cannot step forward and back - Complete Level 4 (Walks with assist in room) - Balance while marching in place and cannot step forward and back - Complete -- Level 4 (Walks with assist in room) - Balance while marching in place and cannot step forward and back - Complete Level 4 (Walks with assist in room) - Balance while marching in place and cannot step forward and back - Complete   Is the above level different from baseline  mobility prior to current illness? -- Yes - Recommend PT order -- Yes - Recommend PT order Yes - Recommend PT order    Row Name 03/13/21 2034 03/13/21 0800         Does patient have an order for bedrest or is patient medically unstable No - Continue assessment No - Continue assessment      What is the highest level of mobility based on the progressive mobility assessment? Level 4 (Walks with assist in room) - Balance while marching in place and cannot step forward and back - Complete Level 2 (Chairfast) - Balance while sitting on edge of bed and cannot stand      Is the above level different from baseline mobility prior to current illness? Yes - Recommend PT order Yes - Recommend PT order                 Severe sepsis without septic shock ([name])-resolved as of 03/17/2021 Ruled out Sepsis-like physiology secondary to presentation with profound dehydration, hypoperfusion from dehydration related hypotension seizure activity Chest work-up was negative  Skin abnormalities/perineal MASD Wound / Incision (Open or Dehisced) 03/09/21 (MASD) Moisture Associated Skin Damage Groin Right redness under scrotum and to the right Groin (Active)  Date First Assessed/Time First Assessed: 03/09/21 0500   Wound Type: (MASD) Moisture Associated Skin Damage  Location: Groin  Location Orientation: Right  Wound Description (Comments): redness under scrotum and to the right Groin  Present on Admission.Marland KitchenMarland Kitchen  Assessments 03/09/2021  5:00 AM 03/18/2021  8:19 PM  Dressing Type None;Other (Comment) None  Dressing Changed Changed --  Dressing Status Clean, Dry, Intact --  Site / Wound Assessment Clean;Pink;Painful --  Peri-wound Assessment Intact --  Margins Attached edges (approximated) --  Non-staged Wound Description Not applicable --  Treatment Cleansed;Other (Comment) --     No Linked orders to display    Protein calorie malnutrition ([name]) Patient having difficulty eating independently and completing  meal We will have OT evaluate to determine if adaptive devices indicated Appears to have some visual disturbance as well contributing Increase protein shakes from twice daily to AC/HS        Subjective:  Awake and sitting up in bed with Posey belt in place.  CNA at bedside preparing breakfast tray.  Appropriately interactive but remains confused and impulsive.  When I left the room he apparently thought he was supposed to follow behind me and had to be redirected by the nursing staff to remain in bed.  Physical Exam: Vitals:   03/21/21 1948 03/21/21 2319 03/22/21 0312 03/22/21 0717  BP: 107/76 115/74 104/82 107/89  Pulse: 95 87 98 87  Resp: 17 18 18 17   Temp: 97.9 F (36.6 C) 97.9 F (36.6 C) 97.6 F (36.4 C) 97.8 F (36.6 C)  TempSrc: Oral Oral Oral Oral  SpO2: 100% 99% 100% 100%  Weight:      Height:       Constitutional: NAD, calm with appropriate verbal and behavioral interaction today during conversational speech Respiratory: Anterior lung sounds clear, Normal respiratory effort. RA Cardiovascular: Regular pulse, no peripheral edema.  Normotensive.  S1-S2 Abdomen: no tenderness, no masses palpated. Bowel sounds positive. LBM 3/5 Neurologic: CN 2-12 grossly intact. Sensation intact, Strength 3+-4/5 x all 4 extremities.  Psychiatric: Alert and oriented to self only.  Pleasant affect.  Remains impulsive and requires Posey belt restraint.  Data Reviewed: There are no new results to review at this time.  Family Communication:  Patient only  Disposition: Status is: Inpatient Remains inpatient appropriate because:  Unsafe discharge plan secondary to severe cognitive impairment requiring SNF placement.  Currently lacks funding.      DVT Prophylaxis  .Rivaroxaban (xarelto) tablet 10 mg , Rivaroxaban (xarelto) tablet 10 mg   Planned Discharge Destination:  Skilled nursing facility  Medically stable: No-still titrating behavioral meds  COVID vaccination status:   Unknown  Consultants: Nephrology Psychiatry    Procedures: EEG Echocardiogram TDC   Antibiotics: Unasyn x1 dose, cefepime x1 dose, ceftriaxone x1 dose, vancomycin x2 doses     Time spent: 15 minutes  Author: Junious Silk, NP 03/22/2021 10:01 AM  For on call review [email or web address].

## 2021-03-23 MED ORDER — TRAZODONE HCL 100 MG PO TABS
100.0000 mg | ORAL_TABLET | Freq: Every day | ORAL | Status: DC
Start: 2021-03-24 — End: 2021-05-08
  Administered 2021-03-24 – 2021-05-07 (×45): 100 mg via ORAL
  Filled 2021-03-23 (×46): qty 1

## 2021-03-23 MED ORDER — STERILE WATER FOR INJECTION IJ SOLN
INTRAMUSCULAR | Status: AC
  Administered 2021-03-23: 2.1 mL
  Filled 2021-03-23: qty 10

## 2021-03-23 NOTE — Progress Notes (Signed)
Progress Note   Patient: Steve [name] [id number] DOB: 22-Apr-1965 DOA: 01/05/2021     77 DOS: the patient was seen and examined on 03/23/2021   Brief hospital course: Steve [name] is a 56 y.o. male with history of major depression, anxiety who was in a residential ETOH treatment center for last 5 months. He was brought to the ED on 12/20 from a residential ETOH treatment center for convalescence, combativeness and altered sensorium.  Per collateral history, patient was not acting typical for last few days.  In the ED, patient was confused, combative. Labs showed AKI with creatinine of 3.27, rhabdomyolysis, abnormal LFTs, elevated WBC count at 23.7 and lactic acid level at 7.6. 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 Blood culture was sent, empiric antibiotic started.  IV fluid resuscitation started and patient was initially admitted to ICU. 12/21, underwent LP which showed staph in CSF, felt to be contaminant.  EEG did not show any seizures 12/24, HD catheter was placed but patient did not require HD as creatinine started to improve 12/31, with clinical improvement, patient was transferred out of ICU to [facility]. 1/18 Cognitive evaluations: 2 on the SLUMS, a 9 on the MMSE, and a 0 on medi-cog and short blessed test  Assessment and Plan: Left  LE pain Recurrent XR neg Plan is to use simple ankle support for ambulation  * Cognitive impairment  2/2 Wernicke's encephalopathy and alcoholic dementia Initial MRI revealed tiny acute infarct on the right but not significant enough to explain persistent cognitive and behavioral abnormalities. Also evidence of prior lacunar infarct right basal ganglia Cognitive screening revealed significant cognitive deficits Continue Prozac, Depakote, Klonopin and Zyprexa  Cont HS Melatonin Over the WE (3/4-3/5) had worsened nocturnal behaviors  prompting re imvolvement of psych team- meds adjusted Continue sitter -cont posey belt when sitter not available. Depakote decreased to 500 mg BID, Zyprexa adjusted to 5 mg 2x daily during daytime hours, trazadone increased to 100 mg HS and HS dose of Klonopin dc'd Appreciate psychiatric team assistance with medications.  Recommendation is to allow recent changes to either become effective or demonstrate that adjustments need to be made. LFTs back up; QTC stable at 464 ms (3/6); platelets are normal w/o thrombocytopneic changes; VPA is subtherapeutic at 37 with a normal albumin Continue thiamine-previously had been given high-dose IV thiamine earlier in the hospitalization with some improvement in cognition     Witnessed seizure ([place]) No apparent prior history of seizure disorder noting patient was not on AEDs prior to admission Unclear if withdrawal seizure or related to other substances EEG unremarkable.   Continue Keppra at recommendation of neurology  Acute kidney injury with rhabdomyolysis-resolved as of 03/17/2021 Resolved Creatinine peaked at 7.48 on 12/26.  Nephrology consulted with initial plans to initiate dialysis including placement of [facility] but renal function gradually improved and dialysis not needed and [facility] removed  Eczema Patient has multiple areas on extremities consistent with atopic dermatitis.  Also has significantly dry scalp with flaking skin. Continue topical steroids Selsun shampoo to hair and scalp As of 3/1 significant decrease in pruritus and redness of lesions on extremities-lotion primarily being applied to upper extremities therefore we will add application to lower extremity   2/27   2/27   3/5     Aspiration pneumonia ([name])-resolved as of 03/04/2021 Completed a course of Zosyn  Possible DVT (deep venous thrombosis) ([name])-resolved as of 03/17/2021  Per critical care note, hemodialysis cath had a clot on it when it was removed.  UE duplex  12/31: no DVT.     Elevated transaminases at time of presentation-resolved as of 03/04/2021 Likely secondary to sepsis like physiology at presentation His AST and ALT were significantly elevated to over 700s on admission.  Re emergence mild elevation LFTs-likely due to Zyprexa and Depakote Due to oversedation Zyprexa dose decreased LFTs have normalized with decrease in Zyprexa dose. 2/16 valproic acid level 55   Right ventricular dysfunction Echo 1/2 with EF 60 to 65%.  Cannot rule out a small PFO this finding determined to be clinically insignificant given the patient was asymptomatic  Physical deconditioning 2/2 ambulatory dysfunction Influenced by patient's poor cognition as well as prior heavy alcohol abuse and malnutrition before admission Therapy continues to recommend SNF placement due to level of assist required with ADLs and IADLs as well as ongoing poor cognition Mobility continues to be influenced not only by cognition but by ambulatory dysfunction/gait and imbalance issues Current goal is to improve mobility to the point where patient can get up independently without being at risk for falls therefore Posey belt restraint can be discontinued  A physical therapy consult is indicated based on the patients mobility assessment.   Mobility Assessment (last 72 hours)     Mobility Assessment     Row Name 03/15/21 1700 03/15/21 1200 03/14/21 1200 03/14/21 0952 03/14/21 0830   Does patient have an order for bedrest or is patient medically unstable -- No - Continue assessment -- No - Continue assessment No - Continue assessment   What is the highest level of mobility based on the progressive mobility assessment? Level 4 (Walks with assist in room) - Balance while marching in place and cannot step forward and back - Complete Level 4 (Walks with assist in room) - Balance while marching in place and cannot step forward and back - Complete -- Level 4 (Walks with assist in room) - Balance while marching in  place and cannot step forward and back - Complete Level 4 (Walks with assist in room) - Balance while marching in place and cannot step forward and back - Complete   Is the above level different from baseline mobility prior to current illness? -- Yes - Recommend PT order -- Yes - Recommend PT order Yes - Recommend PT order    Row Name 03/13/21 2034 03/13/21 0800         Does patient have an order for bedrest or is patient medically unstable No - Continue assessment No - Continue assessment      What is the highest level of mobility based on the progressive mobility assessment? Level 4 (Walks with assist in room) - Balance while marching in place and cannot step forward and back - Complete Level 2 ([place]) - Balance while sitting on edge of bed and cannot stand      Is the above level different from baseline mobility prior to current illness? Yes - Recommend PT order Yes - Recommend PT order                 Severe sepsis without septic shock ([name])-resolved as of 03/17/2021 Ruled out Sepsis-like physiology secondary to presentation with profound dehydration, hypoperfusion from dehydration related hypotension seizure activity Chest work-up was negative  Skin abnormalities/perineal MASD Wound / Incision (Open or Dehisced) 03/09/21 (MASD) Moisture Associated Skin Damage Groin Right redness under scrotum and to the right Groin (Active)  Date First Assessed/Time  First Assessed: 03/09/21 0500   Wound Type: (MASD) Moisture Associated Skin Damage  Location: Groin  Location Orientation: Right  Wound Description (Comments): redness under scrotum and to the right Groin  Present on Admission...    Assessments 03/09/2021  5:00 AM 03/18/2021  8:19 PM  Dressing Type None;Other (Comment) None  Dressing Changed Changed --  Dressing Status Clean, Dry, Intact --  Site / Wound Assessment Clean;Pink;Painful --  Peri-wound Assessment Intact --  Margins Attached edges (approximated) --  Non-staged Wound  Description Not applicable --  Treatment Cleansed;Other (Comment) --     No Linked orders to display    Protein calorie malnutrition ([place]) Patient having difficulty eating independently and completing meal We will have OT evaluate to determine if adaptive devices indicated Appears to have some visual disturbance as well contributing Increase protein shakes from twice daily to AC/HS        Subjective:  Patient confused.  Tells myself and the case manager at the bedside that he will be able to return to work next week.  He is hopeful that the Posey belt restraint will not be removed.  He is asking to take a shower.   Physical Exam: Vitals:   03/22/21 2020 03/22/21 2356 03/23/21 0321 03/23/21 0756  BP: 104/81 99/80 (!) 84/65 100/80  Pulse: 89 92 72 87  Resp: 16 18 17 18   Temp: 98 F (36.7 C) 97.9 F (36.6 C) 98.3 F (36.8 C) 98 F (36.7 C)  TempSrc: Oral Oral Oral   SpO2: 100% 98% 100% 100%  Weight:      Height:       Constitutional: NAD, calm  Respiratory: Anterior lung sounds clear, Normal respiratory effort. RA Cardiovascular: Regular pulse, no peripheral edema.  Normotensive.  S1-S2 Abdomen: no tenderness, no masses palpated. Bowel sounds positive. LBM 3/5 Neurologic: CN 2-12 grossly intact. Sensation intact, Strength 3+-4/5 x all 4 extremities.  Psychiatric: Alert and oriented to self only.  Pleasant affect.  Remains impulsive and requires Posey belt restraint.  Data Reviewed: There are no new results to review at this time.  Family Communication:  Patient only  Disposition: Status is: Inpatient Remains inpatient appropriate because:  Unsafe discharge plan secondary to severe cognitive impairment requiring SNF placement.  Currently lacks funding.      DVT Prophylaxis  .Rivaroxaban (xarelto) tablet 10 mg , Rivaroxaban (xarelto) tablet 10 mg   Planned Discharge Destination:  Skilled nursing facility  Medically stable: No-still titrating behavioral  meds  COVID vaccination status:  Unknown  Consultants: Nephrology Psychiatry    Procedures: EEG Echocardiogram TDC   Antibiotics: Unasyn x1 dose, cefepime x1 dose, ceftriaxone x1 dose, vancomycin x2 doses     Time spent: 15 minutes  Author: Erin Hearing, NP 03/23/2021 9:50 AM  For on call review [email or web address].

## 2021-03-23 NOTE — Progress Notes (Signed)
[name] Followup Face-to-Face Psychiatric Evaluation   Service Date: March 23, 2021 LOS:  LOS: 77 days    Assessment  Steve [name] is a 56 y.o. male admitted medically for 01/05/2021  1:14 PM for full body seizure, AMS and combativeness. He carries the psychiatric diagnoses of MDD and and has a past medical history of Wernicke's encephalopathy and alcoholic dementia .Psychiatry was consulted for agitation by Susa Raring, MD.    His current presentation of confusion  is most consistent with Wernicke's encephalopathy and alcoholic dementia. Outpatient psychotropic medications include Prozac, Trazodone and gabapentin and historically he has had an unknown response to these medications.  Please see plan below for detailed recommendations.   Patient appears confused but pleasant. Patient requires redirection and was able to endorse that night time is difficult for him. Patient did not require any PRN's last night. Would recommend redirection and behavior modifications at night to decrease PRN antipsychotic use as there is concern about transaminitis and patient and would like to decrease risk for Qtc prolongation.  Diagnoses:  Active Hospital problems: Principal Problem:   Cognitive impairment  2/2 Wernicke's encephalopathy and alcoholic dementia Active Problems:   Skin abnormalities/perineal MASD   Physical deconditioning 2/2 ambulatory dysfunction   Right ventricular dysfunction   Witnessed seizure ([name])   Protein calorie malnutrition ([name])   Ambulatory dysfunction   Eczema   Pressure injury of skin     Plan  ## Safety and Observation Level:  - Based on my clinical evaluation, I estimate the patient to be at moderate risk of self harm in the current setting, but requires redirection to keep himself from unintentional self harm. - At this time, we recommend a close level of observation. This decision is based on my review of the chart including patient's history  and current presentation, interview of the patient, mental status examination, and consideration of suicide risk including evaluating suicidal ideation, plan, intent, suicidal or self-harm behaviors, risk factors, and protective factors. This judgment is based on our ability to directly address suicide risk, implement suicide prevention strategies and develop a safety plan while the patient is in the clinical setting. Please contact our team if there is a concern that risk level has changed.   ## Medications:  -- Continue klonopin 0.5mg  BID -- Continue Prozac 40mg  - Continue Melatonin 10mg  QHS - Change Trazodone to 100mg  QHS from 6 pm -- Continue Zyprexa 5mg  BID and 15mg  QHS -- Continue Depakene 500mg  BID  ## Medical Decision Making Capacity:  -- Not formally assessed  ## Further Work-up:  -- Per primary   -- most recent EKG on 3/6 had QtC of 464 -- Pertinent labwork reviewed earlier this admission includes: Depakote: 37 Most recent labs: CMP (sodium 138), LFT's trending down; PT-INR WNL 3/4 CBC with low H/H and normalizing MCHC; Magnesium now WNL  ## Disposition:  -- Per primary  ## Behavioral / Environmental:  1: Provide appropriate lighting and clear signage; a clock and calendar should be easily visible to the patient. 2: Continue to provide activities for patient ( coloring, word search, book) 3: Reorient the patient to person, place, time and situation on each encounter.  4: Minimize use of Antipsychotics PRN. Patient benefits from constant observation by a sitter.  ##Legal Status   Thank you for this consult request. Recommendations have been communicated to the primary team.  We will continue to follow at this time.   PGY-2 Bobbye Morton, MD    followup history  Relevant Aspects of Hospital Course:  Admitted on 01/05/2021 forull body seizure, AMS and combativeness .  Patient Report:  Patient is not oriented to place or time. Patient reports that he is in  [place] and despite being aware that [facility] is in [place] and reading the wall board, patient is adamant that he is not in [facility]. Patient denies SI, HI, and AVH. However, he later begins trying to talk to a "cat" that he see's behind the chair in his room. Patient is not able to spell the word "world" forwards. Patient reports that he is a bit upset about being restrained at his wrist and then endorses he would like to read the bible.   Patient overall is pleasant. Provider brought patient activities for stimulation. Patient appreciated word search, and pictures to color, although he did not really appear to know what to do with the materials.   Collateral information:  No one has been able to reach his son.  Psychiatric History:  MDD, anxiety.  Vistaril 50 mg every 4 hours needed for anxiety, Prozac 40 mg daily and trazodone 50 mg at bedtime.   Family psych history: None known per EMR.   Social History:    Tobacco use: Defer Alcohol use: Defer Drug use: Defer  Family History:   The patient's family history is not on file.  Medical History: History reviewed. No pertinent past medical history.  Surgical History: History reviewed. No pertinent surgical history.  Medications:   Current Facility-Administered Medications:    acetaminophen (TYLENOL) tablet 650 mg, 650 mg, Oral, Q4H PRN, Shalhoub, Deno Lunger, MD, 650 mg at 03/20/21 0725   amLODipine (NORVASC) tablet 2.5 mg, 2.5 mg, Oral, Daily, Dahal, Binaya, MD, 2.5 mg at 03/23/21 2353   aspirin tablet 325 mg, 325 mg, Oral, Daily, 325 mg at 03/23/21 0814 **OR** [DISCONTINUED] aspirin suppository 300 mg, 300 mg, Rectal, Daily, Rejeana Brock, MD   clonazePAM Scarlette Calico) tablet 0.5 mg, 0.5 mg, Oral, 2 times per day, Susa Raring K, MD, 0.5 mg at 03/23/21 1554   docusate sodium (COLACE) capsule 100 mg, 100 mg, Oral, BID, Charlott Holler, MD, 100 mg at 03/23/21 0814   feeding supplement (ENSURE ENLIVE / ENSURE PLUS)  liquid 237 mL, 237 mL, Oral, TID AC & HS, Russella Dar, NP, 237 mL at 03/23/21 1555   FLUoxetine (PROZAC) capsule 40 mg, 40 mg, Oral, Daily, Dahal, Binaya, MD, 40 mg at 03/23/21 6144   folic acid (FOLVITE) tablet 1 mg, 1 mg, Oral, Daily, Vann, Jessica U, DO, 1 mg at 03/23/21 0815   levETIRAcetam (KEPPRA) 100 MG/ML solution 500 mg, 500 mg, Oral, BID, Adhikari, Amrit, MD, 500 mg at 03/23/21 0814   MEDLINE mouth rinse, 15 mL, Mouth Rinse, BID, Olalere, Adewale A, MD, 15 mL at 03/23/21 0816   melatonin tablet 10 mg, 10 mg, Oral, QHS, Ghimire, Shanker M, MD, 10 mg at 03/22/21 2123   nicotine (NICODERM CQ - dosed in mg/24 hours) patch 21 mg, 21 mg, Transdermal, Daily, Dahal, Binaya, MD, 21 mg at 03/23/21 0817   [DISCONTINUED] OLANZapine zydis (ZYPREXA) disintegrating tablet 2.5 mg, 2.5 mg, Oral, Q8H PRN, 2.5 mg at 03/17/21 0956 **OR** OLANZapine (ZYPREXA) injection 5 mg, 5 mg, Intramuscular, Q8H PRN, Cinderella, Margaret A   OLANZapine (ZYPREXA) tablet 15 mg, 15 mg, Oral, QHS, Mariel Craft, MD, 15 mg at 03/22/21 2123   OLANZapine (ZYPREXA) tablet 5 mg, 5 mg, Oral, BID WC, Mariel Craft, MD, 5 mg at 03/23/21 1554  ondansetron (ZOFRAN) injection 4 mg, 4 mg, Intravenous, Q8H PRN, Kamat, Sunil G, MD, 4 mg at 01/15/21 0208   polyethylene glycol (MIRALAX / GLYCOLAX) packet 17 g, 17 g, Oral, Daily, Charlott Holler, MD, 17 g at 03/23/21 9379   rivaroxaban (XARELTO) tablet 10 mg, 10 mg, Oral, Daily, Russella Dar, NP, 10 mg at 03/23/21 0816   selenium sulfide (SELSUN) 1 % shampoo, , Topical, Daily, Russella Dar, NP, Given at 03/22/21 1019   thiamine tablet 250 mg, 250 mg, Oral, Daily, Ghimire, Shanker M, MD, 250 mg at 03/23/21 0814   [START ON 03/24/2021] traZODone (DESYREL) tablet 100 mg, 100 mg, Oral, QHS, Breelyn Icard B, MD   valproic acid (DEPAKENE) 250 MG capsule 500 mg, 500 mg, Oral, BID, Leroy Sea, MD, 500 mg at 03/23/21 0240  Allergies: No Known Allergies     Objective   Vital signs:  Temp:  [97.6 F (36.4 C)-98.3 F (36.8 C)] 97.6 F (36.4 C) (03/07 1511) Pulse Rate:  [72-92] 88 (03/07 1511) Resp:  [15-18] 15 (03/07 1511) BP: (84-106)/(65-82) 106/79 (03/07 1511) SpO2:  [98 %-100 %] 100 % (03/07 1511)  Psychiatric Specialty Exam:  Presentation  General Appearance: Bizarre (naked, confused that his blanket is a XL T-shirt, and can't figure out how to cover himself)  Eye Contact:Good  Speech:Clear and Coherent  Speech Volume:Normal  Handedness:No data recorded  Mood and Affect  Mood:Euthymic  Affect:Congruent   Thought Process  Thought Processes:Irrevelant  Descriptions of Associations:Circumstantial  Orientation:Full (Time, Place and Person)  Thought Content:Scattered  History of Schizophrenia/Schizoaffective disorder:No data recorded Duration of Psychotic Symptoms:No data recorded Hallucinations:Hallucinations: Visual Description of Visual Hallucinations: says he see's a small black cat behind the chair, during visit  Ideas of Reference:Delusions (doesn't believe he is at [facility])  Suicidal Thoughts:Suicidal Thoughts: No  Homicidal Thoughts:Homicidal Thoughts: No   Sensorium  Memory:Immediate Poor; Recent Poor  Judgment:Impaired  Insight:None   Executive Functions  Concentration:Poor  Attention Span:Poor  Recall:Fair  Fund of Knowledge:Poor  Language:Fair   Psychomotor Activity  Psychomotor Activity:Psychomotor Activity: Restlessness   Assets  Assets:Resilience   Sleep  Sleep:Sleep: Fair    Physical Exam: Physical Exam HENT:     Head: Normocephalic and atraumatic.  Pulmonary:     Effort: Pulmonary effort is normal.  Neurological:     Mental Status: He is alert. He is disoriented.   Review of Systems  Psychiatric/Behavioral:  Negative for depression and suicidal ideas.   Blood pressure 106/79, pulse 88, temperature 97.6 F (36.4 C), temperature source Oral, resp. rate 15, height 5\' 8"  (1.727  m), weight 64.3 kg, SpO2 100 %. Body mass index is 21.55 kg/m.

## 2021-03-23 NOTE — Progress Notes (Signed)
Physical Therapy Treatment ?Patient Details ?Name: Steve [name] ?MRN: [id number] ?DOB: [birth year] ?Today's Date: 03/23/2021 ? ? ?History of Present Illness Pt is a 56 y.o. male admitted from group home for convalescence, combativeness and altered sensorium on 01/05/21 with witnessed seizure, AMS. 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 EEG unremarkable. LP on 12/21 showed staph in CSF, felt to be contaminant. Workup for AKI, rhabdomyolysis. Course complicated by c/o L foot pain; imaging negative for acute injury. Pt remains hospitalized due to difficult to place. Pt with psych consult who felt no need for inpt psych. No PMH in chart. ? ?  ?PT Comments  ? ? Pt more alert and oriented today and participated well with PT. Pt not agitated or restless however remains to have visual hallucinations, easily distracted with decreased insight to deficits and safety. Pt redirectable today however demo'd no carry over requiring constant re-direction. Pt continues with L LE antalgia when ambulating requiring minAx2 via HHA for safe ambulation. Acute PT to cont to follow. Continue to recommend SNF upon d/c to address both physical and cognitive deficits. ?  ?Recommendations for follow up therapy are one component of a multi-disciplinary discharge planning process, led by the attending physician.  Recommendations may be updated based on patient status, additional functional criteria and insurance authorization. ? ?Follow Up Recommendations ? Skilled nursing-short term rehab (<3 hours/day) ?  ?  ?Assistance Recommended at Discharge Frequent or constant Supervision/Assistance  ?Patient can return home with the following A little help with walking and/or transfers;A little help with bathing/dressing/bathroom;Assistance with cooking/housework;Direct supervision/assist for medications management;Direct  supervision/assist for financial management;Assist for transportation;Help with stairs or ramp for entrance ?  ?Equipment Recommendations ?    ?  ?Recommendations for Other Services   ? ? ?  ?Precautions / Restrictions Precautions ?Precautions: Fall ?Precaution Comments: posey belt and bilat mittens ?Restrictions ?Weight Bearing Restrictions: No ?LLE Weight Bearing: Weight bearing as tolerated ?Other Position/Activity Restrictions: CAM boot not needed per ortho note  ?  ? ?Mobility ? Bed Mobility ?Overal bed mobility: Needs Assistance ?Bed Mobility: Supine to Sit, Sit to Supine ?  ?  ?Supine to sit: Min guard, HOB elevated ?Sit to supine: Supervision ?  ?General bed mobility comments: min guard for safety, no physical assist ?  ? ?Transfers ?Overall transfer level: Needs assistance ?Equipment used: 1 person hand held assist ?Transfers: Sit to/from Stand ?Sit to Stand: Min assist ?  ?  ?  ?  ?  ?General transfer comment: minA to steady pt, pt with initial lateral L instabilty ?  ? ?Ambulation/Gait ?Ambulation/Gait assistance: Min assist, +2 physical assistance ?Gait Distance (Feet): 260 Feet ?Assistive device: 2 person hand held assist ?Gait Pattern/deviations: Step-through pattern, Decreased stride length, Staggering left, Staggering right, Narrow base of support ?Gait velocity: decr ?Gait velocity interpretation: <1.31 ft/sec, indicative of household ambulator ?  ?General Gait Details: pt initially 1 person HHA and then pt with notable L LE antalgic limp requiring increased assist. Pt with near cross over gait requiring constant verbal cues to keep feet shoulder width apart, pt with no significant LOB however very unsteady with lateral sway ? ? ?Stairs ?  ?  ?  ?  ?  ? ? ?Wheelchair Mobility ?  ? ?Modified Rankin (Stroke Patients Only) ?  ? ? ?  ?Balance Overall balance assessment: Needs assistance ?Sitting-balance support: Feet supported, No upper extremity supported ?Sitting balance-Leahy Scale:  Good ?Sitting  balance - Comments: able to sit on EOB without assitance ?  ?Standing balance support: No upper extremity supported, During functional activity ?Standing balance-Leahy Scale: Fair ?Standing balance comment: pt washed hands at sink however was leaning up against sink with counter ?  ?  ?  ?  ?  ?  ?  ?  ?  ?  ?  ?  ? ?  ?Cognition Arousal/Alertness: Awake/alert ?Behavior During Therapy: Impulsive ?Overall Cognitive Status: Impaired/Different from baseline ?Area of Impairment: Following commands, Safety/judgement, Awareness, Problem solving ?  ?  ?  ?  ?  ?  ?  ?  ?Orientation Level: Disoriented to, Situation, Time (aware he is in the hospital) ?Current Attention Level: Focused (easily distracted) ?Memory: Decreased short-term memory ?Following Commands: Follows one step commands with increased time ?Safety/Judgement: Decreased awareness of deficits, Decreased awareness of safety ?Awareness: Emergent (able to express the need to have a BM) ?Problem Solving: Requires verbal cues, Requires tactile cues ?General Comments: pt easily distracted, visual hallucinations, jumping from one thing to the next, difficulty staying on task requiring max directional cues ?  ?  ? ?  ?Exercises   ? ?  ?General Comments General comments (skin integrity, edema, etc.): pt assisted to the bathroom, pt did perform hygiene, verbal directional cues to complete washing of hands s/p BM ?  ?  ? ?Pertinent Vitals/Pain Pain Assessment ?Pain Assessment: Faces ?Faces Pain Scale: No hurt (pt denies pain however does amb with L LE limp)  ? ? ?Home Living   ?  ?  ?  ?  ?  ?  ?  ?  ?  ?   ?  ?Prior Function    ?  ?  ?   ? ?PT Goals (current goals can now be found in the care plan section) Progress towards PT goals: Progressing toward goals ? ?  ?Frequency ? ? ? Min 2X/week ? ? ? ?  ?PT Plan Current plan remains appropriate  ? ? ?Co-evaluation   ?  ?  ?  ?  ? ?  ?AM-PAC PT "6 Clicks" Mobility   ?Outcome Measure ? Help needed turning from your back to  your side while in a flat bed without using bedrails?: A Little ?Help needed moving from lying on your back to sitting on the side of a flat bed without using bedrails?: A Little ?Help needed moving to and from a bed to a chair (including a wheelchair)?: A Little ?Help needed standing up from a chair using your arms (e.g., wheelchair or bedside chair)?: A Little ?Help needed to walk in hospital room?: A Lot ?Help needed climbing 3-5 steps with a railing? : A Lot ?6 Click Score: 16 ? ?  ?End of Session Equipment Utilized During Treatment: Gait belt ?Activity Tolerance: Patient tolerated treatment well ?Patient left: with call bell/phone within reach;in bed;with bed alarm set;with restraints reapplied (posey belt and bilat mittens) ?Nurse Communication: Mobility status ?PT Visit Diagnosis: Other abnormalities of gait and mobility (R26.89);Other symptoms and signs involving the nervous system (R29.898) ?  ? ? ?Time: 2836-6294 ?PT Time Calculation (min) (ACUTE ONLY): 26 min ? ?Charges:  $Gait Training: 8-22 mins ?$Therapeutic Activity: 8-22 mins          ?          ? ?Lewis Shock, PT, DPT ?Acute Rehabilitation Services ?Pager #: [phone] ?Office #: [phone] ? ? ? ?Devron Cohick M Tollie Canada ?03/23/2021, 1:08 PM ? ?

## 2021-03-24 LAB — HEPATIC FUNCTION PANEL
ALT: 89 U/L — ABNORMAL HIGH (ref 0–44)
AST: 59 U/L — ABNORMAL HIGH (ref 15–41)
Albumin: 3.5 g/dL (ref 3.5–5.0)
Alkaline Phosphatase: 40 U/L (ref 38–126)
Bilirubin, Direct: <0.1 mg/dL (ref 0.0–0.2)
Total Bilirubin: 0.2 mg/dL — ABNORMAL LOW (ref 0.3–1.2)
Total Protein: 6.9 g/dL (ref 6.5–8.1)

## 2021-03-24 MED ORDER — OLANZAPINE 2.5 MG PO TABS
10.0000 mg | ORAL_TABLET | Freq: Every day | ORAL | Status: DC
  Administered 2021-03-24 – 2021-04-06 (×14): 10 mg via ORAL
  Filled 2021-03-24 (×14): qty 4
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
## 2021-03-24 NOTE — Progress Notes (Signed)
Steve [name] Psychiatry Followup Face-to-Face Psychiatric Evaluation   Service Date: March 24, 2021 LOS:  LOS: 78 days    Assessment  Steve [name] is a 56 y.o. male admitted medically for 01/05/2021  1:14 PM for full body seizure, AMS and combativeness. He carries the psychiatric diagnoses of MDD and and has a past medical history of Wernicke's encephalopathy and alcoholic dementia .Psychiatry was consulted for agitation by Steve RaringPrashant Singh, [name].      His current presentation of confusion  is most consistent with Wernicke's encephalopathy and alcoholic dementia. Outpatient psychotropic medications include Prozac, Trazodone and gabapentin and historically he has had an unknown response to these medications.  Please see plan below for detailed recommendations.    Patient appears confused but pleasant. Patient is in 3 point restraints, but does not appear to be a fall risk anymore. Patient is mostly saying irrelevant things, but appears to recall the names of some providers and the hospital. Patient is noted to confabulate on assessment. Overall patient's night time episodes are suggesting that patient may require a strong D2 R antagonist for his agitation.   Diagnoses:  Active Hospital problems: Principal Problem:   Cognitive impairment  2/2 Wernicke's encephalopathy and alcoholic dementia Active Problems:   Skin abnormalities/perineal MASD   Physical deconditioning 2/2 ambulatory dysfunction   Right ventricular dysfunction   Witnessed seizure ([name])   Protein calorie malnutrition ([name])   Ambulatory dysfunction   Eczema   Pressure injury of skin     Plan  ## Safety and Observation Level:  - Based on my clinical evaluation, I estimate the patient to be at moderate risk of self harm in the current setting, but requires redirection to keep himself from unintentional self harm. - At this time, we recommend a close level of observation. This decision is based on my review of the chart  including patient's history and current presentation, interview of the patient, mental status examination, and consideration of suicide risk including evaluating suicidal ideation, plan, intent, suicidal or self-harm behaviors, risk factors, and protective factors. This judgment is based on our ability to directly address suicide risk, implement suicide prevention strategies and develop a safety plan while the patient is in the clinical setting. Please contact our team if there is a concern that risk level has changed.     ## Medications:  -- Continue klonopin 0.5mg  BID -- Continue Prozac 40mg  - Continue Melatonin 10mg  QHS - Continue Trazodone 100mg  QHS  -- Decrease Zyprexa to 5mg  BID and 10mg  QHS -- Continue Depakene 500mg  BID - Will likely start upward titration of Risperdal with corresponding down titration of Zyprexa this week - May continue Zyprexa 5mg  q8h PRN for agitation   ## Medical Decision Making Capacity:  -- Not formally assessed   ## Further Work-up:  -- Per primary  -- Recommend updated EKG, resulted below   -- most recent EKG on 3/8 had QtC of 462 -- Pertinent labwork reviewed earlier this admission includes: Depakote: 37 Most recent labs: CMP (sodium 138), LFT's trending up (3/8( AST 59, ALT 89).  PT-INR WNL 3/4 CBC with low H/H and normalizing MCHC; Magnesium now WNL   ## Disposition:  -- Per primary   ## Behavioral / Environmental:  1: Provide appropriate lighting and clear signage; a clock and calendar should be easily visible to the patient. 2: Continue to provide activities for patient ( coloring, word search, book) 3: Reorient the patient to person, place, time and situation on each encounter.  4: Minimize  use of Antipsychotics PRN. Patient benefits from constant observation by a sitter.   ##Legal Status     Thank you for this consult request. Recommendations have been communicated to the primary team.  We will continue to follow at this time.    PGY-2 Steve Morton, [name]   followup history  Relevant Aspects of Hospital Course:  Admitted on 01/05/2021 for for full body seizure, AMS and combativeness .    Patient Report:  Patient oriented person and to hospital but not city. Patient was not oriented to month or day but was oriented to year. Patient reports that he does not feel like he slept well and does recall some of the techs and staff having to restrain him last night. Patient denies SI, HI and AVH today. Patient reports that he has not seen things in his room in the last 24hrs. Patient endorses that he did see and hear cats in his room yesterday, but today he thinks this is odd and that it is unlikely that cats were ever in his room.  Collateral information:  No one has been able to reach his son.   Psychiatric History:  MDD, anxiety.  Vistaril 50 mg every 4 hours needed for anxiety, Prozac 40 mg daily and trazodone 50 mg at bedtime.    Family psych history: None known per EMR.     Social History:      Tobacco use: Defer Alcohol use: Defer Drug use: Defer   Family History:    The patient's family history is not on file. Medical History: History reviewed. No pertinent past medical history.  Surgical History: History reviewed. No pertinent surgical history.  Medications:   Current Facility-Administered Medications:    acetaminophen (TYLENOL) tablet 650 mg, 650 mg, Oral, Q4H PRN, Steve [name], 650 mg at 03/20/21 0725   amLODipine (NORVASC) tablet 2.5 mg, 2.5 mg, Oral, Daily, Steve [name], 2.5 mg at 03/24/21 5284   aspirin tablet 325 mg, 325 mg, Oral, Daily, 325 mg at 03/24/21 0858 **OR** [DISCONTINUED] aspirin suppository 300 mg, 300 mg, Rectal, Daily, Steve [name]   clonazePAM Scarlette Calico) tablet 0.5 mg, 0.5 mg, Oral, 2 times per day, Steve [name], 0.5 mg at 03/24/21 1608   docusate sodium (COLACE) capsule 100 mg, 100 mg, Oral, BID, Steve [name], 100 mg at 03/24/21  0858   feeding supplement (ENSURE ENLIVE / ENSURE PLUS) liquid 237 mL, 237 mL, Oral, TID AC & HS, Steve [name], 237 mL at 03/24/21 0906   FLUoxetine (PROZAC) capsule 40 mg, 40 mg, Oral, Daily, Steve [name], 40 mg at 03/24/21 0858   folic acid (FOLVITE) tablet 1 mg, 1 mg, Oral, Daily, [name], Steve U, DO, 1 mg at 03/24/21 0858   levETIRAcetam (KEPPRA) 100 MG/ML solution 500 mg, 500 mg, Oral, BID, [name], Amrit, [name], 500 mg at 03/24/21 0858   MEDLINE mouth rinse, 15 mL, Mouth Rinse, BID, [name], Steve A, [name], 15 mL at 03/23/21 0816   melatonin tablet 10 mg, 10 mg, Oral, QHS, [name], Steve M, [name], 10 mg at 03/23/21 2047   nicotine (NICODERM CQ - dosed in mg/24 hours) patch 21 mg, 21 mg, Transdermal, Daily, Steve [name], 21 mg at 03/24/21 0900   [DISCONTINUED] OLANZapine zydis (ZYPREXA) disintegrating tablet 2.5 mg, 2.5 mg, Oral, Q8H PRN, 2.5 mg at 03/17/21 0956 **OR** OLANZapine (ZYPREXA) injection 5 mg, 5 mg, Intramuscular, Q8H PRN, [name], Steve A, 5 mg at 03/23/21 2304  OLANZapine (ZYPREXA) tablet 10 mg, 10 mg, Oral, QHS, Boneta Standre B, [name]   OLANZapine (ZYPREXA) tablet 5 mg, 5 mg, Oral, BID WC, Mariel Craft, [name], 5 mg at 03/24/21 1608   ondansetron (ZOFRAN) injection 4 mg, 4 mg, Intravenous, Q8H PRN, Roxan [name], Steve Marker, [name], 4 mg at 01/15/21 0208   polyethylene glycol (MIRALAX / GLYCOLAX) packet 17 g, 17 g, Oral, Daily, Steve [name], 17 g at 03/24/21 2130   rivaroxaban (XARELTO) tablet 10 mg, 10 mg, Oral, Daily, Steve [name], 10 mg at 03/24/21 0859   selenium sulfide (SELSUN) 1 % shampoo, , Topical, Daily, Steve [name], Given at 03/22/21 1019   thiamine tablet 250 mg, 250 mg, Oral, Daily, [name], Werner Lean, [name], 250 mg at 03/24/21 0858   traZODone (DESYREL) tablet 100 mg, 100 mg, Oral, QHS, Debar Plate B, [name]   valproic acid (DEPAKENE) 250 MG capsule 500 mg, 500 mg, Oral, BID, Leroy Sea, [name], 500 mg at 03/24/21 8657  Allergies: No  Known Allergies     Objective  Vital signs:  Temp:  [97.8 F (36.6 C)-98.2 F (36.8 C)] 98.2 F (36.8 C) (03/08 1500) Pulse Rate:  [82-100] 90 (03/08 1500) Resp:  [18] 18 (03/08 1500) BP: (108-125)/(84-96) 125/96 (03/08 1500) SpO2:  [100 %] 100 % (03/08 1500)  Psychiatric Specialty Exam:  Presentation  General Appearance: Appropriate for Environment (covered today, wearing a scrub top and blanket is appropriate)  Eye Contact:Fair  Speech:Clear and Coherent  Speech Volume:Normal  Handedness:No data recorded  Mood and Affect  Mood:Euthymic  Affect:Congruent   Thought Process  Thought Processes:Irrevelant  Descriptions of Associations:Tangential  Orientation:-- (oriented to person, hospital, but not city or situation)  Thought Content:Scattered  History of Schizophrenia/Schizoaffective disorder:No data recorded Duration of Psychotic Symptoms:No data recorded Hallucinations:Hallucinations: None (denies in last 24h) Description of Visual Hallucinations: says he see's a small black cat behind the chair, during visit  Ideas of Reference:Delusions  Suicidal Thoughts:Suicidal Thoughts: No  Homicidal Thoughts:Homicidal Thoughts: No   Sensorium  Memory:Immediate Poor; Recent Poor  Judgment:Impaired  Insight:None   Executive Functions  Concentration:Poor  Attention Span:Poor  Recall:Fair  Fund of Knowledge:Poor  Language:Fair   Psychomotor Activity  Psychomotor Activity:Psychomotor Activity: Normal   Assets  Assets:Resilience   Sleep  Sleep:Sleep: Poor    Physical Exam: Physical Exam HENT:     Head: Normocephalic and atraumatic.  Pulmonary:     Effort: Pulmonary effort is normal.  Neurological:     Mental Status: He is alert. He is disoriented.   Review of Systems  Psychiatric/Behavioral:  Negative for suicidal ideas. The patient has insomnia.   Blood pressure (!) 125/96, pulse 90, temperature 98.2 F (36.8 C), temperature source  Oral, resp. rate 18, height 5\' 8"  (1.727 m), weight 64.3 kg, SpO2 100 %. Body mass index is 21.55 kg/m.

## 2021-03-24 NOTE — Progress Notes (Signed)
Speech Language Pathology Treatment: Cognitive-Linquistic  ?Patient Details ?Name: Steve [name] ?MRN: [id number] ?DOB: [birth year] ?Today's Date: 03/24/2021 ?Time: 3664-4034 ?SLP Time Calculation (min) (ACUTE ONLY): 20 min ? ?Assessment / Plan / Recommendation ?Clinical Impression ? Pt was seen for cognitive-linguistic treatment. He was alert and cooperative, but required frequent re-orientation. Despite his being able to accurately state his being in the hospital, he exhibited difficulty with reasoning for why he should not complete various activities. Pt stated that he would like to get back to where he was prior to admission. Pt was more distractible and appeared to be having visual hallucinations which were, understandably, distracting. Pt frequently referenced, and pointed to, individuals and objections which were not present. He was able to be redirected, but often returned to speaking to "Steve [name]". He completed the an app-based attention task with 73% accuracy given verbal prompts for instruction. He exhibited an average response time of 39.26 seconds with a range of 16.68-79.35 seconds. Despite apparent visual hallucinations, these scores are improved compared to when he was last seen. He exhibited difficulty attending to other cognitive-linguistic tasks which required less visual attention. He demonstrated 80% accuracy with identification of the time on clocks increasing to 100% accuracy with cues. He achieved 25% accuracy with related time problems increasing to 75% accuracy with prompts and cues. With min cues pt was again able to verbalize the steps necessary for requesting help and the potential ramifications of him getting out of bed unassisted. However, this does not appear to be generalizing very well. SLP will continue to follow pt.   ?  ?HPI HPI: Pt is a 56 y.o. male admitted from group home (substance abuse rehab?) on 01/05/21 with witnessed seizure, AMS. CT head negative for acute bleeding or infarct  but showed prominent fluid space at the left cerebral pontine angle raising possibility of epidermoid arachnoid cyst. EEG unremarkable. LP on 12/21 showed staph in CSF, felt to be contaminant. Workup for AKI, rhabdomyolysis. Dx Uremic encephalopathy. Course complicated by c/o L foot pain; imaging negative for acute injury. Pt remains hospitalized due to difficulty with placement. SLP evaluation on 01/26/21: 9/27 on Mini Mental State Exam, SLUMS 02/03/21: 2/30; SLUMS 1/27: 1/30. Psych consulted and as of 1/16, pt did not meet criteria for inpatient psychiatric admission. ?  ?   ?SLP Plan ? Continue with current plan of care ? ?  ?  ?Recommendations for follow up therapy are one component of a multi-disciplinary discharge planning process, led by the attending physician.  Recommendations may be updated based on patient status, additional functional criteria and insurance authorization. ?  ? ?Recommendations  ?   ?   ?    ?   ? ? ? ? Oral Care Recommendations: Oral care BID ?Follow Up Recommendations: Skilled nursing-short term rehab (<3 hours/day) ?Assistance recommended at discharge: Frequent or constant Supervision/Assistance ?SLP Visit Diagnosis: Cognitive communication deficit (R41.841);Attention and concentration deficit ?Attention and concentration deficit following: Other cerebrovascular disease ?Plan: Continue with current plan of care ? ? ? ? ?  ?  ? ?Steve [name] I. Steve Clock, MS, CCC-SLP ?Acute Rehabilitation Services ?Office number [phone] ?Pager [phone] ? ?Steve [name] ? ?03/24/2021, 8:25 AM ? ? ?  ?

## 2021-03-24 NOTE — Progress Notes (Signed)
This RN responded to bed alarm, and upon entering room, pt had broken out of L wrist restraint, chewed through R wrist restraint, and had undone R side of posey belt restraint.  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  This RN able to get other staff to room to help get patient back into room.  MD notified and order placed to add on bilateral ankle restraints until patient can not be aggressive towards staff.  Pt's bilateral wrists red, but blanchable due to patient pulling attempting to break, and ultimately breaking free from restraints.  Pt unable to be redirected at all, and states there are "crackheads tweaking in my room."  Pt laying in bed at this time, condom cath reapplied and pt started to appear calmer, but still unable to redirect or reorient.  Will continue to monitor closely.   ? 03/24/21 0154  ?Restraint Every 2 Hour Monitoring  ?Airway Clear with Spontaneous Respirations Yes  ?Circulation / Skin Integrity Redness (see note)  ?Emotional / Mental Status Agitated/restless;Confused;Delusional;Hallucination;Verbally abusive;Other (Comment) ?(charging at staff)  ?Range of Motion Other (Comment) ?(pt broke wrist restraints)  ?Food and Fluids Patient declined  ?Elimination External urinary catheter  ?Patient's rights, dignity, safety maintained Yes  ?Can Restraints be Less Restrictive or Discontinued? No  ? ? ?

## 2021-03-24 NOTE — Progress Notes (Signed)
HOSPITAL MEDICINE OVERNIGHT EVENT NOTE   ? ?Notified by nursing the patient continues to exhibit significant confusion.  Patient is impeding medical care and is regularly attempting to get out of bed despite numerous nursing attempts to redirect the patient ? ?Patient has had great success with usage of a bedside sitter.  We will go ahead and renew this order to prevent patient from placing himself in harm's way. ? ?Marinda Elk  MD ?[facility]  ? ? ? ? ? ? ? ? ? ? ? ?

## 2021-03-25 MED ORDER — RISPERIDONE 1 MG PO TBDP
1.0000 mg | ORAL_TABLET | Freq: Every day | ORAL | Status: DC
  Administered 2021-03-25 – 2021-04-11 (×18): 1 mg via ORAL
  Filled 2021-03-25 (×18): qty 1

## 2021-03-25 NOTE — Progress Notes (Signed)
?Progress Note ? ? ?Steve [name] OBS:[id number] DOB: December 31, 1965 DOA: 01/05/2021     79 ?DOS: the patient was seen and examined on 03/25/2021 ?  ?Brief hospital course: ? ?Steve [name] is a 56 y.o. male with history of major depression, anxiety who was in a residential ETOH treatment center for last 5 months. He was brought to the ED on 12/20 from a residential ETOH treatment center for convalescence, combativeness and altered sensorium.  Per collateral history, patient was not acting typical for last few days. ? ?In the ED, patient was confused, combative. Labs showed AKI with creatinine of 3.27, rhabdomyolysis, abnormal LFTs, elevated WBC count at 23.7 and lactic acid level at 7.6. 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 Blood culture was sent, empiric antibiotic started.  IV fluid resuscitation started and patient was initially admitted to ICU. ?12/21, underwent LP which showed staph in CSF, felt to be contaminant.  EEG did not show any seizures ?12/24, HD catheter was placed but patient did not require HD as creatinine started to improve ?12/31, with clinical improvement, patient was transferred out of ICU to [facility]. ? ? ?Subjective: ?Patient remains impulsive, trying to stand out of bed, he has a sitter at bedside, and restraints.  ? ?Assessment and Plan: ? ?Cognitive impairment  2/2 Wernicke's encephalopathy and alcoholic dementia  ?-   Initial MRI revealed tiny acute infarct on the right but not significant enough to explain persistent cognitive and behavioral abnormalities. Also evidence of prior lacunar infarct right basal ganglia, was seen by Neuro. seen by both neurology and psychiatry.  Had was extremely sedated earlier in the week of 03/10/2021.  Psych and neurology were reconsulted.   ?-Psych follow-up greatly appreciated, continue with Klonopin, Prozac, Zyprexa and  Depakene, ?-He has been impulsive recently where he is needing a sitter at bedside, and four-point restraints overnight, will change to three-point restraint today and try to discontinue restraints as tolerated. ? ? ?Physical deconditioning 2/2 ambulatory dysfunction - needs SNF due to combination of severe deconditioning and poor cognition due to #1 above. ?  ?Witnessed seizure   - No apparent prior history of seizure disorder noting patient was not on AEDs prior to admission,  Unclear if withdrawal seizure or related to other substances, EEG unremarkable.   Continue Keppra at recommendation of neurology. ? ?Acute kidney injury with rhabdomyolysis -  Resolved. Nephrology was consulted with initial plans to initiate dialysis including placement of [facility] but renal function gradually improved and dialysis not needed and TDC removed. ? ?Moderate Protein calorie malnutrition  - on protein supplementation. ?  ?Possible DVT   - Per critical care note, hemodialysis cath had a clot on it when it was removed.  UE duplex  12/31: no DVT.  Acting Xarelto. ? ?Right ventricular dysfunction - Echo 1/2 with EF 60 to 65%.  Cannot rule out a small PFO this finding determined to be clinically insignificant given the patient was asymptomatic ? ?Aspiration pneumonia - resolved as of 03/04/2021 -   Completed a course of Zosyn ? ?Severe sepsis without septic shock ([name])  Resolved ?  ?Eczema - much better on steroid cream ? ?  ? ?Objective  ? ?Physical Exam: ? ?Vitals:  ? 03/24/21 2059 03/24/21 2356 03/25/21 0302 03/25/21 0740  ?BP:  125/77 121/80 107/69  ?Pulse: 99 90 76 100  ?Resp:  18 17 19   ?Temp:  98.4 ?F (  36.9 ?C) 98.2 ?F (36.8 ?C) 97.9 ?F (36.6 ?C)  ?TempSrc:    Oral  ?SpO2:  95% 96% 98%  ?Weight:      ?Height:      ? ? ?Awake , frail , trying to stand out of bed, on restraints, sitter at bedside.   ?Symmetrical Chest wall movement, Good air movement bilaterally, CTAB ?RRR,No Gallops,Rubs or new Murmurs, No Parasternal Heave ?+ve  B.Sounds, Abd Soft, No tenderness, No rebound - guarding or rigidity. ?No Cyanosis, Clubbing or edema, No new Rash or bruise   ?   ? ? ? ? ? ?Recent Labs  ?Lab 03/20/21 ?1002  ?WBC 4.7  ?HGB 12.0*  ?HCT 35.2*  ?PLT 264  ?MCV 94.6  ?MCH 32.3  ?MCHC 34.1  ?RDW 13.4  ?LYMPHSABS 1.9  ?MONOABS 0.5  ?EOSABS 0.2  ?BASOSABS 0.1  ? ? ? ?Recent Labs  ?Lab 03/20/21 ?0653 03/21/21 ?0333 03/24/21 ?1133  ?NA 137 138  --   ?K 4.8 3.7  --   ?CL 103 102  --   ?CO2 21* 29  --   ?GLUCOSE 86 94  --   ?BUN 18 24*  --   ?CREATININE 0.52* 0.58*  --   ?CALCIUM 9.2 9.2  --   ?AST 86* 53* 59*  ?ALT 82* 78* 89*  ?ALKPHOS 38 40 40  ?BILITOT 1.2 0.4 0.2*  ?ALBUMIN 3.5 3.5 3.5  ?MG 2.1  --   --   ?INR  --  1.1  --   ? ? ?DVT Prophylaxis  .Rivaroxaban (xarelto) tablet 10 mg , Rivaroxaban (xarelto) tablet 10 mg  ? ?Planned Discharge Destination:  ?Skilled nursing facility ? ?Medically stable: ?No-still titrating behavioral meds ? ?COVID vaccination status:  ?Unknown ? ?Consultants: ?Nephrology ?Psychiatry ?  ? ?Procedures: ?EEG ?Echocardiogram ?TDC ? ? Antibiotics: ?Unasyn x1 dose, cefepime x1 dose, ceftriaxone x1 dose, vancomycin x2 doses ? ?Scheduled Meds: ? ? amLODipine  2.5 mg Oral Daily  ? aspirin  325 mg Oral Daily  ? clonazePAM  0.5 mg Oral 2 times per day  ? docusate sodium  100 mg Oral BID  ? feeding supplement  237 mL Oral TID AC & HS  ? FLUoxetine  40 mg Oral Daily  ? folic acid  1 mg Oral Daily  ? levETIRAcetam  500 mg Oral BID  ? mouth rinse  15 mL Mouth Rinse BID  ? melatonin  10 mg Oral QHS  ? nicotine  21 mg Transdermal Daily  ? OLANZapine  10 mg Oral QHS  ? OLANZapine  5 mg Oral BID WC  ? polyethylene glycol  17 g Oral Daily  ? rivaroxaban  10 mg Oral Daily  ? selenium sulfide   Topical Daily  ? thiamine  250 mg Oral Daily  ? traZODone  100 mg Oral QHS  ? valproic acid  500 mg Oral BID  ? ?Continuous Infusions: ?PRN Meds:.acetaminophen, [DISCONTINUED] OLANZapine zydis **OR** OLANZapine, ondansetron (ZOFRAN) IV  ?Time spent: 15  minutes ? ?Signature ? ?Huey Bienenstock M.D on 03/25/2021 at 11:47 AM   -  To page go to [email or web address]  ? ?  ? ? ?

## 2021-03-25 NOTE — Progress Notes (Addendum)
I have independently evaluated the patient during a face-to-face assessment on 03/26/21. I reviewed the patient's chart, and I participated in key portions of the service. I discussed the case with the [employer], and I agree with the assessment and plan of care as documented in the House Officer's note.   Made some changes to assessment as below.   Jeral Fruit, MD   [facility] Followup Face-to-Face Psychiatric Evaluation   Service Date: March 25, 2021 LOS:  LOS: 23 days    Assessment  Steve [name] is a 56 y.o. male admitted medically for 01/05/2021  1:14 PM for full body seizure, AMS and combativeness. He carries the psychiatric diagnoses of MDD and and has a past medical history of Wernicke's encephalopathy and alcoholic dementia .Psychiatry was consulted for agitation by Lala Lund, MD.      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 Information on premorbid diagnoses is sparse; at minimum included severe AUD. Outpatient psychotropic medications include Prozac, Trazodone and gabapentin and historically he has had an unknown response to these medications. Focus of treatment in the hospital has been on controlling behaviors and impulsivity; this is difficult given ongoing confabulations (which will not respond to medications and are not being specifically targeted) and pt's relative inability to retain information (such as his need to stay in bed due to being a fall risk). This will likely become easier as he works with PT and is afforded more mobility.  Please see plan below for detailed recommendations. Current over-arching treatment is downtitration of olanzapine due to poor response at high doses, anticholinergic side effect profile (and outside chance it is contributing to transaminitis) while introducing risperidone (please see daily plan for changes). Would like to increase  depakote if LFTs improve as he seemed to have improved behavior at a higher dose; if there is no improvement early in week of 3/13 will likely need to discontinue this medication. Future considerations include    3/9: Patient appears confused but pleasant. Patient is in 4 point restraints, but does not appear to be a fall risk anymore. Patient is mostly saying irrelevant things. He continues to confabulate. Patient continues to have mild transmnitis. Delivered pt boat themed word searches in afternoon  Diagnoses:  Active Hospital problems: Principal Problem:   Cognitive impairment  2/2 Wernicke's encephalopathy and alcoholic dementia Active Problems:   Skin abnormalities/perineal MASD   Physical deconditioning 2/2 ambulatory dysfunction   Right ventricular dysfunction   Witnessed seizure ([name])   Protein calorie malnutrition ([name])   Ambulatory dysfunction   Eczema   Pressure injury of skin     Plan  ## Safety and Observation Level:  - Based on my clinical evaluation, I estimate the patient to be at moderate risk of self harm in the current setting, but requires redirection to keep himself from unintentional self harm. - At this time, we recommend a close level of observation. This decision is based on my review of the chart including patient's history and current presentation, interview of the patient, mental status examination, and consideration of suicide risk including evaluating suicidal ideation, plan, intent, suicidal or self-harm behaviors, risk factors, and protective factors. This judgment is based on our ability to directly address suicide risk, implement suicide prevention strategies and develop a safety plan while the patient is in the clinical setting. Please contact our team if there is a concern that risk level has changed.     ##  Medications:  -- Continue klonopin 0.5mg  BID -- Continue Prozac 40mg  - Continue Melatonin 10mg  QHS - Continue Trazodone 100mg  QHS  --  Continue  Zyprexa to 5mg  BID and 10mg  QHS -- Continue Depakene 500mg  BID - Start Risperdal 1mg  QHS -- Recommend 3 point restraints this AM - May continue Zyprexa 5mg  q8h PRN for agitation   ## Medical Decision Making Capacity:  -- Not formally assessed   ## Further Work-up:  -- Per primary  -- Recommend updated EKG, resulted below   -- most recent EKG on 3/8 had QtC of 462 -- Pertinent labwork reviewed earlier this admission includes: Depakote: 37 Most recent labs: CMP (sodium 138), LFT's trending up (3/8( AST 59, ALT 89).  PT-INR WNL 3/4 CBC with low H/H and normalizing MCHC; Magnesium now WNL   ## Disposition:  -- Per primary   ## Behavioral / Environmental:  1: Provide appropriate lighting and clear signage; a clock and calendar should be easily visible to the patient. 2: Continue to provide activities for patient ( coloring, word search, book) 3: Reorient the patient to person, place, time and situation on each encounter.  4: Minimize use of Antipsychotics PRN. Patient benefits from constant observation by a sitter.   ##Legal Status     Thank you for this consult request. Recommendations have been communicated to the primary team.  We will continue to follow at this time.   PGY-2 [name], MD    followup history  Relevant Aspects of Hospital Course:  Admitted on 01/05/2021 for [place].  Patient Report:  On assessment today patient is alert to person. Patient reports that he is aware the provider works for "[employer]" then goes on to say he has been speanking with Dr. Larence Penning who is a man. Patient then trails off to say he wishes he had an ice cream cone. Ptietn endorses that he believes that it is either 1994 or 1998 and that it is either Nov or Dec. Patient reports that he has seen 2 "baby cats" in his room today, but he hasn't heard them. Patient denies seeing anything else bizarre. Patient denies SI, HI and AH. Patient is able to endorse that he was feeling restless last  night.   Provider noticed patient had an empty box of pizza on his window seal. Patient endorsed that he had ordered it himself. Sitter and RN did not know who else the box could belong too and were not aware as to how it got into his room.  Collateral information:  No one has been able to reach his son.   Psychiatric History:  MDD, anxiety.  Vistaril 50 mg every 4 hours needed for anxiety, Prozac 40 mg daily and trazodone 50 mg at bedtime.    Family psych history: None known per EMR.     Social History:      Tobacco use: Defer Alcohol use: Defer Drug use: Defer   Family History:  The patient's family history is not on file.  Medical History: History reviewed. No pertinent past medical history.  Surgical History: History reviewed. No pertinent surgical history.  Medications:   Current Facility-Administered Medications:    acetaminophen (TYLENOL) tablet 650 mg, 650 mg, Oral, Q4H PRN, Shalhoub, Sherryll Burger, MD, 650 mg at 03/20/21 0725   amLODipine (NORVASC) tablet 2.5 mg, 2.5 mg, Oral, Daily, Dahal, Binaya, MD, 2.5 mg at 03/25/21 0830   aspirin tablet 325 mg, 325 mg, Oral, Daily, 325 mg at 03/25/21 0830 **OR** [DISCONTINUED] aspirin suppository 300 mg,  300 mg, Rectal, Daily, Greta Doom, MD   clonazePAM [facility]) tablet 0.5 mg, 0.5 mg, Oral, 2 times per day, Lala Lund K, MD, 0.5 mg at 03/25/21 0830   docusate sodium (COLACE) capsule 100 mg, 100 mg, Oral, BID, Spero Geralds, MD, 100 mg at 03/25/21 [id number]   feeding supplement (ENSURE ENLIVE / ENSURE PLUS) liquid 237 mL, 237 mL, Oral, TID AC & HS, Samella Parr, NP, 237 mL at 03/25/21 0835   FLUoxetine (PROZAC) capsule 40 mg, 40 mg, Oral, Daily, Dahal, Binaya, MD, 40 mg at [id number]   folic acid (FOLVITE) tablet 1 mg, 1 mg, Oral, Daily, Vann, Jessica U, DO, 1 mg at 03/25/21 0829   levETIRAcetam (KEPPRA) 100 MG/ML solution 500 mg, 500 mg, Oral, BID, Adhikari, Amrit, MD, 500 mg at 03/25/21 [id number]   MEDLINE mouth  rinse, 15 mL, Mouth Rinse, BID, Olalere, Adewale A, MD, 15 mL at 03/24/21 2131   melatonin tablet 10 mg, 10 mg, Oral, QHS, Ghimire, Shanker M, MD, 10 mg at 03/24/21 2127   nicotine (NICODERM CQ - dosed in mg/24 hours) patch 21 mg, 21 mg, Transdermal, Daily, Dahal, Binaya, MD, 21 mg at 03/25/21 0836   [DISCONTINUED] OLANZapine zydis (ZYPREXA) disintegrating tablet 2.5 mg, 2.5 mg, Oral, Q8H PRN, 2.5 mg at 03/17/21 0956 **OR** OLANZapine (ZYPREXA) injection 5 mg, 5 mg, Intramuscular, Q8H PRN, Aser Nylund A, 5 mg at 03/23/21 2304   OLANZapine (ZYPREXA) tablet 10 mg, 10 mg, Oral, QHS, McQuilla, Jai B, MD, 10 mg at 03/24/21 2127   OLANZapine (ZYPREXA) tablet 5 mg, 5 mg, Oral, BID WC, Lavella Hammock, MD, 5 mg at 03/25/21 0829   ondansetron (ZOFRAN) injection 4 mg, 4 mg, Intravenous, Q8H PRN, Jeannene Patella, Sunil G, MD, 4 mg at 01/15/21 0208   polyethylene glycol (MIRALAX / GLYCOLAX) packet 17 g, 17 g, Oral, Daily, Spero Geralds, MD, 17 g at 03/25/21 [id number]   rivaroxaban (XARELTO) tablet 10 mg, 10 mg, Oral, Daily, Erin Hearing L, NP, 10 mg at 03/25/21 0830   selenium sulfide (SELSUN) 1 % shampoo, , Topical, Daily, Samella Parr, NP, Given at 03/22/21 1019   thiamine tablet 250 mg, 250 mg, Oral, Daily, Ghimire, Shanker M, MD, 250 mg at 03/25/21 0830   traZODone (DESYREL) tablet 100 mg, 100 mg, Oral, QHS, McQuilla, Jai B, MD, 100 mg at 03/24/21 2127   valproic acid (DEPAKENE) 250 MG capsule 500 mg, 500 mg, Oral, BID, Thurnell Lose, MD, 500 mg at 03/25/21 [id number]  Allergies: No Known Allergies     Objective  Vital signs:  Temp:  [97.9 F (36.6 C)-98.4 F (36.9 C)] 97.9 F (36.6 C) (03/09 0740) Pulse Rate:  [76-118] 100 (03/09 0740) Resp:  [17-19] 19 (03/09 0740) BP: (107-125)/(69-96) 107/69 (03/09 0740) SpO2:  [95 %-100 %] 98 % (03/09 0740)  Psychiatric Specialty Exam:  Presentation  General Appearance: Appropriate for Environment; Casual (in 4 point restraints)  Eye  Contact:Fair  Speech:Clear and Coherent  Speech Volume:Normal  Handedness:No data recorded  Mood and Affect  Mood:Euthymic  Affect:Congruent   Thought Process  Thought Processes:Irrevelant  Descriptions of Associations:Loose  Orientation:Partial  Thought Content:Scattered  History of Schizophrenia/Schizoaffective disorder:No data recorded Duration of Psychotic Symptoms:No data recorded Hallucinations:Hallucinations: Visual  Ideas of Reference:Delusions  Suicidal Thoughts:Suicidal Thoughts: No  Homicidal Thoughts:Homicidal Thoughts: No   Sensorium  Memory:Immediate Poor; Recent Poor  Judgment:Impaired  Insight:None   Executive Functions  Concentration:Poor  Attention Span:Poor  [place] of Knowledge:Poor  Language:Fair  Psychomotor Activity  Psychomotor Activity:Psychomotor Activity: Restlessness   Assets  Assets:Resilience; Social Support   Sleep  Sleep:Sleep: Fair    Physical Exam: Physical Exam HENT:     Head: Normocephalic.  Pulmonary:     Effort: Pulmonary effort is normal.  Neurological:     Mental Status: He is alert. He is disoriented.   Review of Systems  Psychiatric/Behavioral:  Positive for hallucinations. Negative for suicidal ideas. The patient has insomnia.   Blood pressure 107/69, pulse 100, temperature 97.9 F (36.6 C), temperature source Oral, resp. rate 19, height 5\' 8"  (1.727 m), weight 64.3 kg, SpO2 98 %. Body mass index is 21.55 kg/m.

## 2021-03-25 NOTE — Plan of Care (Signed)
?  Problem: Coping: ?Goal: Level of anxiety will decrease ?Outcome: Progressing ?  ?Problem: Safety: ?Goal: Ability to remain free from injury will improve ?Outcome: Progressing ?  ?Problem: Skin Integrity: ?Goal: Risk for impaired skin integrity will decrease ?Outcome: Progressing ?  ?Problem: Safety: ?Goal: Verbalization of understanding the information provided will improve ?Outcome: Progressing ?  ?

## 2021-03-25 NOTE — Progress Notes (Signed)
HOSPITAL MEDICINE OVERNIGHT EVENT NOTE   ? ?Notified by nursing that patient continues to be extremely agitated throughout the evening shift.  Patient is regularly swinging at staff with their free arm.  Patient is also able to maneuver and nearly fall out of bed.  Patient is obstructing medical care and also pulling at medical devices. ? ?There is already a bedside sitter that is unable to redirect the patient. ? ?Unfortunately in the single wrist restraint order earlier in the day is not proving to be adequate and protecting the patient.  We will modify restraints to include bilateral wrists and a soft waist belt for now.  Intent to remove these restraints as soon as able. ? ?Marinda Elk  MD ?[facility]  ? ? ? ? ? ? ? ? ? ? ? ?

## 2021-03-26 ENCOUNTER — Inpatient Hospital Stay (HOSPITAL_COMMUNITY): Payer: Medicaid Other

## 2021-03-26 DIAGNOSIS — F1096 Alcohol use, unspecified with alcohol-induced persisting amnestic disorder: Secondary | ICD-10-CM

## 2021-03-26 MED ORDER — OLANZAPINE 2.5 MG PO TABS
5.0000 mg | ORAL_TABLET | Freq: Every day | ORAL | Status: DC
  Administered 2021-03-27 – 2021-03-31 (×5): 5 mg via ORAL
  Filled 2021-03-26 (×5): qty 2

## 2021-03-26 NOTE — Procedures (Signed)
Patient Name: Steve [name]  ?MRN: [id number]  ?Epilepsy Attending: Charlsie Quest  ?Referring Physician/Provider: Starleen Arms, MD ?Date: 03/26/2021 ?Duration: 21.53 mins ? ?Patient history: 65 old male with altered mental status and witnessed seizure-like activity.  EEG to evaluate for seizure. ? ?Level of alertness: Awake ? ?AEDs during EEG study: LEV, VPA , Klonopin ? ?Technical aspects: This EEG study was done with scalp electrodes positioned according to the 10-20 International system of electrode placement. Electrical activity was acquired at a sampling rate of 500Hz  and reviewed with a high frequency filter of 70Hz  and a low frequency filter of 1Hz . EEG data were recorded continuously and digitally stored.  ? ?Description: No clear posterior dominant rhythm was seen. EEG showed continuous generalized 5 to 6 Hz theta slowing admixed with an excessive amount of 15 to 18 Hz beta activity distributed symmetrically and diffusely.  Hyperventilation and photic stimulation were not performed.    ? ?ABNORMALITY ?- Continuous slow, generalized ?- Excessive beta, generalized ? ?IMPRESSION: ?This study is suggestive of moderate diffuse encephalopathy, nonspecific etiology. The excessive beta activity seen in the background is most likely due to the effect of benzodiazepine and is a benign EEG pattern. No seizures or epileptiform discharges were seen throughout the recording. ? ?  ? ?

## 2021-03-26 NOTE — Progress Notes (Signed)
Progress Note   Patient: Steve [name] [id number] DOB: Mar 11, 1965 DOA: 01/05/2021     80 DOS: the patient was seen and examined on 03/26/2021   Brief hospital course: Steve [name] is a 55 y.o. male with history of major depression, anxiety who was in a residential ETOH treatment center for last 5 months. He was brought to the ED on 12/20 from a residential ETOH treatment center for convalescence, combativeness and altered sensorium.  Per collateral history, patient was not acting typical for last few days.  In the ED, patient was confused, combative. Labs showed AKI with creatinine of 3.27, rhabdomyolysis, abnormal LFTs, elevated WBC count at 23.7 and lactic acid level at 7.6. 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 Blood culture was sent, empiric antibiotic started.  IV fluid resuscitation started and patient was initially admitted to ICU. 12/21, underwent LP which showed staph in CSF, felt to be contaminant.  EEG did not show any seizures 12/24, HD catheter was placed but patient did not require HD as creatinine started to improve 12/31, with clinical improvement, patient was transferred out of ICU to [facility]. 1/18 Cognitive evaluations: 2 on the SLUMS, a 9 on the MMSE, and a 0 on medi-cog and short blessed test  Assessment and Plan: Left  LE pain Recurrent XR neg Plan is to use simple ankle support for ambulation  * Cognitive impairment  2/2 Wernicke's encephalopathy and alcoholic dementia Initial MRI revealed tiny acute infarct on the right but not significant enough to explain persistent cognitive and behavioral abnormalities. Also evidence of prior lacunar infarct right basal ganglia Cognitive screening revealed significant cognitive deficits Continue Prozac, Depakote, Klonopin and Zyprexa  Cont HS Melatonin Over the WE (3/4-3/5) had worsened nocturnal behaviors  prompting re imvolvement of psych team- meds adjusted Continue sitter -cont posey belt when sitter not available. Depakote decreased to 500 mg BID, Zyprexa adjusted to 5 mg 2x daily during daytime hours, trazadone increased to 100 mg HS and HS dose of Klonopin dc'd. As of 3/10 psych felt patient was doing better with Risperdal as opposed to Zyprexa.  Updated on behavioral changes overnight that involved patient striking out at nurses with his arm.  Consideration being given to adjusting Risperdal and tapering and deseeding Zyprexa.  Of note patient has no recollection of last night's events or behavioral manifestations. Overnight into 3/8 patient's delusional thought processes worsened.  During the day he stated he was going back to work "next week".  Overnight he became extremely agitating stating he had to get to work and when staff attempted to keep him in the bed he began hitting at the staff.  He is now restrained at wrists in addition to Posey belt. Theyare considering going up on his Depakote and are also planning to titrate down on Zyprexa and go up on Risperdal while continuing to allow Zyprexa PRN for agitation.  LFTs back up; QTC stable at 464 ms (3/6); platelets are normal w/o thrombocytopneic changes; VPA is subtherapeutic at 37 with a normal albumin-3/8 we will repeat LFTs and EKG Continue thiamine-previously had been given high-dose IV thiamine earlier in the hospitalization with some improvement in cognition     Witnessed seizure ([place]) No apparent prior history of seizure disorder noting patient was not on AEDs prior to admission Unclear if withdrawal seizure or related to other substances 3/10 given recent behavioral changes attending felt EEG needed to be  completed to rule out atypical seizure activity causing behavioral manifestations.  EEG unremarkable other than for moderate diffuse encephalopathy.  No seizure activity. Continue Keppra at recommendation of neurology  Acute  kidney injury with rhabdomyolysis-resolved as of 03/17/2021 Resolved Creatinine peaked at 7.48 on 12/26.  Nephrology consulted with initial plans to initiate dialysis including placement of [facility] but renal function gradually improved and dialysis not needed and [facility] removed  Eczema Patient has multiple areas on extremities consistent with atopic dermatitis.  Also has significantly dry scalp with flaking skin. Continue topical steroids S[place]un shampoo to hair and scalp As of 3/1 significant decrease in pruritus and redness of lesions on extremities-lotion primarily being applied to upper extremities therefore we will add application to lower extremity   2/27   2/27   3/5     Aspiration pneumonia ([name])-resolved as of 03/04/2021 Completed a course of Zosyn  Possible DVT (deep venous thrombosis) ([name])-resolved as of 03/17/2021 Per critical care note, hemodialysis cath had a clot on it when it was removed.  UE duplex  12/31: no DVT.    Elevated transaminases at time of presentation-resolved as of 03/04/2021 Likely secondary to sepsis like physiology at presentation His AST and ALT were significantly elevated to over 700s on admission.  Re emergence mild elevation LFTs-likely due to Zyprexa and Depakote Due to oversedation Zyprexa dose decreased LFTs have normalized with decrease in Zyprexa dose. 2/16 valproic acid level 55   Right ventricular dysfunction Echo 1/2 with EF 60 to 65%.  Cannot rule out a small PFO this finding determined to be clinically insignificant given the patient was asymptomatic  Physical deconditioning 2/2 ambulatory dysfunction Influenced by patient's poor cognition as well as prior heavy alcohol abuse and malnutrition before admission Therapy continues to recommend SNF placement due to level of assist required with ADLs and IADLs as well as ongoing poor cognition Mobility continues to be influenced not only by cognition but by ambulatory dysfunction/gait and  imbalance issues Current goal is to improve mobility to the point where patient can get up independently without being at risk for falls therefore Posey belt restraint can be discontinued  A physical therapy consult is indicated based on the patients mobility assessment.   Mobility Assessment (last 72 hours)     Mobility Assessment     Row Name 03/15/21 1700 03/15/21 1200 03/14/21 1200 03/14/21 0952 03/14/21 0830   Does patient have an order for bedrest or is patient medically unstable -- No - Continue assessment -- No - Continue assessment No - Continue assessment   What is the highest level of mobility based on the progressive mobility assessment? Level 4 (Walks with assist in room) - Balance while marching in place and cannot step forward and back - Complete Level 4 (Walks with assist in room) - Balance while marching in place and cannot step forward and back - Complete -- Level 4 (Walks with assist in room) - Balance while marching in place and cannot step forward and back - Complete Level 4 (Walks with assist in room) - Balance while marching in place and cannot step forward and back - Complete   Is the above level different from baseline mobility prior to current illness? -- Yes - Recommend PT order -- Yes - Recommend PT order Yes - Recommend PT order    [place] Name 03/13/21 2034 03/13/21 0800         Does patient have an order for bedrest or is patient medically unstable No - Continue assessment  No - Continue assessment      What is the highest level of mobility based on the progressive mobility assessment? Level 4 (Walks with assist in room) - Balance while marching in place and cannot step forward and back - Complete Level 2 ([place]) - Balance while sitting on edge of bed and cannot stand      Is the above level different from baseline mobility prior to current illness? Yes - Recommend PT order Yes - Recommend PT order                 Severe sepsis without septic shock  ([name])-resolved as of 03/17/2021 Ruled out Sepsis-like physiology secondary to presentation with profound dehydration, hypoperfusion from dehydration related hypotension seizure activity Chest work-up was negative  Skin abnormalities/perineal MASD Wound / Incision (Open or Dehisced) 03/09/21 (MASD) Moisture Associated Skin Damage Groin Right redness under scrotum and to the right Groin (Active)  Date First Assessed/Time First Assessed: 03/09/21 0500   Wound Type: (MASD) Moisture Associated Skin Damage  Location: Groin  Location Orientation: Right  Wound Description (Comments): redness under scrotum and to the right Groin  Present on Admission...    Assessments 03/09/2021  5:00 AM 03/18/2021  8:19 PM  Dressing Type None;Other (Comment) None  Dressing Changed Changed --  Dressing Status Clean, Dry, Intact --  Site / Wound Assessment Clean;Pink;Painful --  Peri-wound Assessment Intact --  Margins Attached edges (approximated) --  Non-staged Wound Description Not applicable --  Treatment Cleansed;Other (Comment) --     No Linked orders to display    Protein calorie malnutrition ([place]) Patient having difficulty eating independently and completing meal We will have OT evaluate to determine if adaptive devices indicated Appears to have some visual disturbance as well contributing Increase protein shakes from twice daily to AC/HS        Subjective:  Alert and very confused.  Had no recollection of last night's behavioral manifestations.  Concerned over his mental status changes that are going to be permanent.  Reassured patient that we would be adjusting his medications to help and keep him in a safe environment.  Physical Exam: Vitals:   03/26/21 0022 03/26/21 0420 03/26/21 0726 03/26/21 1109  BP: 100/76 99/77 106/75 103/76  Pulse: 87 76 78 86  Resp: 18 18 20 20   Temp: 97.8 F (36.6 C) 97.7 F (36.5 C) 97.9 F (36.6 C) 97.6 F (36.4 C)  TempSrc: Oral Oral Oral Oral  SpO2: 100% 100%  100% 100%  Weight:      Height:       Constitutional: NAD, calm sitting up in chair.  Sitter at bedside. Respiratory: Anterior lung sounds clear, Normal respiratory effort. RA Cardiovascular: Regular pulse, no peripheral edema.  Normotensive.  S1-S2 Abdomen: no tenderness, no masses palpated. Bowel sounds positive. LBM 3/9 Neurologic: CN 2-12 grossly intact. Sensation intact, Strength 3+-4/5 x all 4 extremities.  Psychiatric: Alert and oriented to self only.  Pleasant affect.  Delusional and remains fixated on the need to attend to work and present to the work setting.  Remains impulsive and requires Posey belt restraint.  Data Reviewed: There are no new results to review at this time.  Family Communication:  Patient only  Disposition: Status is: Inpatient Remains inpatient appropriate because:  Unsafe discharge plan secondary to severe cognitive impairment requiring SNF placement.  Currently lacks funding.      DVT Prophylaxis  .Rivaroxaban (xarelto) tablet 10 mg , Rivaroxaban (xarelto) tablet 10 mg   Planned Discharge Destination:  Skilled nursing facility  Medically stable: No-still titrating behavioral meds  COVID vaccination status:  Unknown  Consultants: Nephrology Psychiatry    Procedures: EEG Echocardiogram TDC   Antibiotics: Unasyn x1 dose, cefepime x1 dose, ceftriaxone x1 dose, vancomycin x2 doses     Time spent: 15 minutes  Author: Erin Hearing, NP 03/26/2021 11:50 AM  For on call review [email or web address].

## 2021-03-26 NOTE — Progress Notes (Signed)
Physical Therapy Treatment ?Patient Details ?Name: Steve [name] ?MRN: [id number] ?DOB: 1965-08-07 ?Today's Date: 03/26/2021 ? ? ?History of Present Illness Pt is a 56 y.o. male admitted from group home for convalescence, combativeness and altered sensorium on 01/05/21 with witnessed seizure, AMS. 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 EEG unremarkable. LP on 12/21 showed staph in CSF, felt to be contaminant. Workup for AKI, rhabdomyolysis. Course complicated by c/o L foot pain; imaging negative for acute injury. Pt remains hospitalized due to difficult to place. Pt with psych consult who felt no need for inpt psych. No PMH in chart. ? ?  ?PT Comments  ? ? Pt progressing with mobility and was more consistent today. Pt with continued significant cognitive deficits.    ?Recommendations for follow up therapy are one component of a multi-disciplinary discharge planning process, led by the attending physician.  Recommendations may be updated based on patient status, additional functional criteria and insurance authorization. ? ?Follow Up Recommendations ? Skilled nursing-short term rehab (<3 hours/day) ?  ?  ?Assistance Recommended at Discharge Frequent or constant Supervision/Assistance  ?Patient can return home with the following A little help with walking and/or transfers;A little help with bathing/dressing/bathroom;Assistance with cooking/housework;Direct supervision/assist for medications management;Direct supervision/assist for financial management;Assist for transportation;Help with stairs or ramp for entrance ?  ?Equipment Recommendations ?    ?  ?Recommendations for Other Services   ? ? ?  ?Precautions / Restrictions Precautions ?Precautions: Fall ?Restrictions ?Weight Bearing Restrictions: No ?LLE Weight Bearing: Weight bearing as tolerated ?Other Position/Activity Restrictions: CAM boot  not needed per ortho note  ?  ? ?Mobility ? Bed Mobility ?  ?  ?  ?  ?  ?  ?  ?General bed mobility comments: Pt up in chair ?  ? ?Transfers ?Overall transfer level: Needs assistance ?Equipment used: None, Rolling walker (2 wheels) ?Transfers: Sit to/from Stand ?Sit to Stand: Min guard ?  ?  ?  ?  ?  ?General transfer comment: Assist for safety ?  ? ?Ambulation/Gait ?Ambulation/Gait assistance: Min assist ?Gait Distance (Feet): 250 Feet ?Assistive device: Rolling walker (2 wheels) ?Gait Pattern/deviations: Step-through pattern, Decreased stride length, Antalgic ?Gait velocity: decr ?Gait velocity interpretation: 1.31 - 2.62 ft/sec, indicative of limited community ambulator ?  ?General Gait Details: Assist for safety and balance. ? ? ?Stairs ?  ?Stairs assistance: Min assist ?Stair Management: Two rails, Alternating pattern, Forwards ?Number of Stairs: 3 ?General stair comments: Assist for balance. Pt with continued pain in lt foot ? ? ?Wheelchair Mobility ?  ? ?Modified Rankin (Stroke Patients Only) ?  ? ? ?  ?Balance Overall balance assessment: Needs assistance ?Sitting-balance support: Feet supported, No upper extremity supported ?Sitting balance-Leahy Scale: Good ?  ?  ?Standing balance support: No upper extremity supported, During functional activity ?Standing balance-Leahy Scale: Fair ?  ?  ?  ?  ?  ?  ?  ?  ?  ?  ?  ?  ?  ? ?  ?Cognition Arousal/Alertness: Awake/alert ?Behavior During Therapy: Impulsive ?Overall Cognitive Status: Impaired/Different from baseline ?Area of Impairment: Following commands, Safety/judgement, Awareness, Problem solving ?  ?  ?  ?  ?  ?  ?  ?  ?Orientation Level: Disoriented to, Situation, Time ?Current Attention Level: Sustained ?Memory: Decreased short-term memory ?Following Commands: Follows one step commands with increased time ?Safety/Judgement: Decreased awareness of deficits, Decreased awareness of safety ?Awareness: Emergent ?  Problem Solving: Requires verbal cues, Requires  tactile cues ?General Comments: Pt with tangential speech. Pt thinks he knows me from [place]. ?  ?  ? ?  ?Exercises   ? ?  ?General Comments   ?  ?  ? ?Pertinent Vitals/Pain Pain Assessment ?Pain Assessment: Faces ?Faces Pain Scale: Hurts little more ?Pain Location: L foot ?Pain Descriptors / Indicators: Grimacing, Guarding ?Pain Intervention(s): Monitored during session, Repositioned  ? ? ?Home Living   ?  ?  ?  ?  ?  ?  ?  ?  ?  ?   ?  ?Prior Function    ?  ?  ?   ? ?PT Goals (current goals can now be found in the care plan section) Progress towards PT goals: Progressing toward goals ? ?  ?Frequency ? ? ? Min 2X/week ? ? ? ?  ?PT Plan Current plan remains appropriate  ? ? ?Co-evaluation   ?  ?  ?  ?  ? ?  ?AM-PAC PT "6 Clicks" Mobility   ?Outcome Measure ? Help needed turning from your back to your side while in a flat bed without using bedrails?: A Little ?Help needed moving from lying on your back to sitting on the side of a flat bed without using bedrails?: A Little ?Help needed moving to and from a bed to a chair (including a wheelchair)?: A Little ?Help needed standing up from a chair using your arms (e.g., wheelchair or bedside chair)?: A Little ?Help needed to walk in hospital room?: A Little ?Help needed climbing 3-5 steps with a railing? : A Little ?6 Click Score: 18 ? ?  ?End of Session Equipment Utilized During Treatment: Gait belt ?Activity Tolerance: Patient tolerated treatment well ?Patient left: with call bell/phone within reach;in chair;with nursing/sitter in room [employer] present) ?Nurse Communication: Mobility status ?PT Visit Diagnosis: Other abnormalities of gait and mobility (R26.89);Other symptoms and signs involving the nervous system (R29.898) ?  ? ? ?Time: [id number] ?PT Time Calculation (min) (ACUTE ONLY): 11 min ? ?Charges:  $Gait Training: 8-22 mins          ?          ? ?[facility] PT ?Acute Rehabilitation Services ?Pager [phone] ?Office [phone] ? ? ? ?Shary Decamp  [facility] ?03/26/2021, 1:53 PM ? ?

## 2021-03-26 NOTE — Plan of Care (Signed)
?  Problem: Safety: ?Goal: Ability to remain free from injury will improve ?Outcome: Progressing ?  ?Problem: Safety: ?Goal: Verbalization of understanding the information provided will improve ?Outcome: Progressing ?  ?

## 2021-03-26 NOTE — Progress Notes (Signed)
[facility]   Service Date: March 26, 2021 LOS:  LOS: 80 days    Assessment  Steve SaverJames [name] is a 56 y.o. male admitted medically for 01/05/2021  1:14 PM for full body seizure, AMS and combativeness. He carries the psychiatric diagnoses of MDD and and has a past medical history of Wernicke's encephalopathy and alcoholic dementia .Psychiatry was consulted for agitation by Susa RaringPrashant Singh, MD.      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 Information on premorbid diagnoses is sparse; at minimum included severe AUD. Outpatient psychotropic medications include Prozac, Trazodone and gabapentin and historically he has had an unknown response to these medications. Focus of treatment in the hospital has been on controlling behaviors and impulsivity; this is difficult given ongoing confabulations (which will not respond to medications and are not being specifically targeted) and pt's relative inability to retain information (such as his need to stay in bed due to being a fall risk). This will likely become easier as he works with PT and is afforded more mobility.  Please see plan below for detailed recommendations. Current over-arching treatment is downtitration of olanzapine due to poor response at high doses, anticholinergic side effect profile (and outside chance it is contributing to transaminitis) while introducing risperidone (please see daily plan for changes). Patient may not require direct equivalent dose of Risperdal, as Zyprexa's anticholinergic effects could be contributing to presentation and Risperdal is less likely to cause same side effects. Would like to increase depakote if LFTs improve as he seemed to have improved behavior at a higher dose; if there is no improvement early in week of 3/13 will likely need to discontinue this medication. Future  considerations include    3/10: Patient is pleasant, most oriented he has been in at least 5 days, still confabulating. Appears to have a pattern of sundowning where he is more agitated at night, but is doing better with redirection and restraints are being used at night. Continue to recommend decreased restraints during the day. Patient had his legs and arms free this AM, and was doing fairly well.   Diagnoses:  Active Hospital problems: Principal Problem:   Cognitive impairment  2/2 Wernicke's encephalopathy and alcoholic dementia Active Problems:   Skin abnormalities/perineal MASD   Physical deconditioning 2/2 ambulatory dysfunction   Right ventricular dysfunction   Witnessed seizure ([name])   Protein calorie malnutrition ([name])   Ambulatory dysfunction   Eczema   Pressure injury of skin     Plan  ## Safety and Observation Level:  - Based on my clinical evaluation, I estimate the patient to be at moderate risk of self harm in the current setting, but requires redirection to keep himself from unintentional self harm. - At this time, we recommend a close level of observation. This decision is based on my review of the chart including patient's history and current presentation, interview of the patient, mental status examination, and consideration of suicide risk including evaluating suicidal ideation, plan, intent, suicidal or self-harm behaviors, risk factors, and protective factors. This judgment is based on our ability to directly address suicide risk, implement suicide prevention strategies and develop a safety plan while the patient is in the clinical setting. Please contact our team if there is a concern that risk level has changed.     ## Medications:  -- Continue klonopin 0.5mg  BID -- Continue Prozac 40mg  - Continue Melatonin 10mg   QHS - Continue Trazodone  QHS  -- Decrease Zyprexa to  dailyand  QHS -- Continue Depakene  BID - Continue Risperdal  QHS --  Recommend 3 point restraints this AM - May continue Zyprexa  q8h PRN for agitation   ## Medical Decision Making Capacity:  -- Not formally assessed   ## Further Work-up:  -- Per primary  -- Recommend updated EKG, resulted below   -- most recent EKG on 3/8 had QtC of 462 -- Pertinent labwork reviewed earlier this admission includes: Depakote: 37 Most recent labs: CMP (sodium 138), LFT's trending up (3/8( AST 59, ALT 89).  PT-INR WNL 3/4 CBC with low H/H and normalizing MCHC; Magnesium now WNL   ## Disposition:  -- Per primary   ## Behavioral / Environmental:  1: Provide appropriate lighting and clear signage; a clock and calendar should be easily visible to the patient. 2: Continue to provide activities for patient ( coloring, word search, book) 3: Reorient the patient to person, place, time and situation on each encounter.  4: Minimize use of Antipsychotics PRN. Patient benefits from constant observation by a sitter.   ##Legal Status     Thank you for this consult request. Recommendations have been communicated to the primary team.  We will continue to follow at this time.    PGY-2 Steve Morton, MD    followup history  Relevant Aspects of Hospital Course:  Admitted on 01/05/2021 for Korsakoff.  Patient Report:  Patient appears the most coherent he has been on assessment this past week. ON MD had to change patient to 5 point restraints due to patient agitation; however he did not require PRN medication and no further notes from MD or RN are noted. Patient is noted to be oriented to being in a hospital(although he gets the name incorrect), oriented to month, and year. Patient also recalled that provider was a psychiatrist. Patient is noted to be getting his breakfast and coffee prepared to his liking, while talking during assessment. Patient's word search had markings on it as patient had grouped unrelated letters together. None of the "words" he had found were real  words.  On assessment patient reports that he does not believe he slept more than 2 hrs last night. Patient reports that he has a good appetite. Patient appears to be in a good mood and occasionally jokes. Patient reports that he has been working on his word search. Patient reports that he is "progressing" in staying in his bed at night, but he does not go into much detail today about what happened last night. Patient instead talks more about his career working in [employer] and appears to continue to believe that he works in [employer]. Patient denies SI, HI and endorses AVH. Patient reports that he is still seeing "2 baby cats" and shows that they are approx the size of his coffee mug. Patient reports that he has  heard them, but feels that it is comforting, "but I don't think I will get up to pet them."  Collateral information:  No one has been able to reach his son this month.   Psychiatric History:  MDD, anxiety.  Vistaril 50 mg every 4 hours needed for anxiety, Prozac 40 mg daily and trazodone 50 mg at bedtime.    Family psych history: None known per EMR.     Social History:      Tobacco use: Defer Alcohol use: Defer Drug use: Defer   Family History:  The patient's family history  is not on file.    Medical History: History reviewed. No pertinent past medical history.  Surgical History: History reviewed. No pertinent surgical history.  Medications:   Current Facility-Administered Medications:    acetaminophen (TYLENOL) tablet 650 mg, 650 mg, Oral, Q4H PRN, Shalhoub, Deno Lunger, MD, 650 mg at 03/25/21 1120   amLODipine (NORVASC) tablet 2.5 mg, 2.5 mg, Oral, Daily, Dahal, Binaya, MD, 2.5 mg at 03/26/21 4098   aspirin tablet 325 mg, 325 mg, Oral, Daily, 325 mg at 03/26/21 0859 **OR** [DISCONTINUED] aspirin suppository 300 mg, 300 mg, Rectal, Daily, Rejeana Brock, MD   clonazePAM Scarlette Calico) tablet 0.5 mg, 0.5 mg, Oral, 2 times per day, Susa Raring K, MD, 0.5 mg at  03/26/21 0859   docusate sodium (COLACE) capsule 100 mg, 100 mg, Oral, BID, Charlott Holler, MD, 100 mg at 03/26/21 0859   feeding supplement (ENSURE ENLIVE / ENSURE PLUS) liquid 237 mL, 237 mL, Oral, TID AC & HS, Junious Silk L, NP, 237 mL at 03/26/21 0900   FLUoxetine (PROZAC) capsule 40 mg, 40 mg, Oral, Daily, Dahal, Binaya, MD, 40 mg at 03/26/21 0859   folic acid (FOLVITE) tablet 1 mg, 1 mg, Oral, Daily, Vann, Jessica U, DO, 1 mg at 03/26/21 0858   levETIRAcetam (KEPPRA) 100 MG/ML solution 500 mg, 500 mg, Oral, BID, Adhikari, Amrit, MD, 500 mg at 03/26/21 0858   MEDLINE mouth rinse, 15 mL, Mouth Rinse, BID, Olalere, Adewale A, MD, 15 mL at 03/26/21 0900   melatonin tablet 10 mg, 10 mg, Oral, QHS, Ghimire, Shanker M, MD, 10 mg at 03/25/21 2109   nicotine (NICODERM CQ - dosed in mg/24 hours) patch 21 mg, 21 mg, Transdermal, Daily, Dahal, Binaya, MD, 21 mg at 03/26/21 0900   [DISCONTINUED] OLANZapine zydis (ZYPREXA) disintegrating tablet 2.5 mg, 2.5 mg, Oral, Q8H PRN, 2.5 mg at 03/17/21 0956 **OR** OLANZapine (ZYPREXA) injection 5 mg, 5 mg, Intramuscular, Q8H PRN, Cinderella, Margaret A, 5 mg at 03/23/21 2304   OLANZapine (ZYPREXA) tablet 10 mg, 10 mg, Oral, QHS, Lennie Vasco B, MD, 10 mg at 03/25/21 2108   [START ON 03/27/2021] OLANZapine (ZYPREXA) tablet 5 mg, 5 mg, Oral, Daily, Manali Mcelmurry B, MD   ondansetron (ZOFRAN) injection 4 mg, 4 mg, Intravenous, Q8H PRN, Kamat, Sunil G, MD, 4 mg at 01/15/21 0208   polyethylene glycol (MIRALAX / GLYCOLAX) packet 17 g, 17 g, Oral, Daily, Charlott Holler, MD, 17 g at 03/26/21 0859   risperiDONE (RISPERDAL M-TABS) disintegrating tablet 1 mg, 1 mg, Oral, QHS, Oliviana Mcgahee B, MD, 1 mg at 03/25/21 2109   rivaroxaban (XARELTO) tablet 10 mg, 10 mg, Oral, Daily, Junious Silk L, NP, 10 mg at 03/26/21 0859   selenium sulfide (SELSUN) 1 % shampoo, , Topical, Daily, Russella Dar, NP, Given at 03/26/21 0900   thiamine tablet 250 mg, 250 mg, Oral, Daily,  Ghimire, Shanker M, MD, 250 mg at 03/26/21 0859   traZODone (DESYREL) tablet 100 mg, 100 mg, Oral, QHS, Corene Resnick B, MD, 100 mg at 03/25/21 2109   valproic acid (DEPAKENE) 250 MG capsule 500 mg, 500 mg, Oral, BID, Susa Raring K, MD, 500 mg at 03/26/21 0858  Allergies: No Known Allergies     Objective  Vital signs:  Temp:  [97.7 F (36.5 C)-98.2 F (36.8 C)] 97.9 F (36.6 C) (03/10 0726) Pulse Rate:  [76-90] 78 (03/10 0726) Resp:  [18-20] 20 (03/10 0726) BP: (99-111)/(66-77) 106/75 (03/10 0726) SpO2:  [95 %-100 %] 100 % (03/10 0726)  Psychiatric Specialty Exam:  Presentation  General Appearance: Appropriate for Environment; Casual (wearing clothes correctly, neater apperance)  Eye Contact:Fair  Speech:Clear and Coherent  Speech Volume:Normal  Handedness:No data recorded  Mood and Affect  Mood:Euthymic  Affect:Appropriate; Congruent   Thought Process  Thought Processes:Goal Directed  Descriptions of Associations:Circumstantial  Orientation:Partial (oriented to he in a hospital (name incorrect), oriented to person, month, and year)  Thought Content:Tangential  History of Schizophrenia/Schizoaffective disorder:No data recorded Duration of Psychotic Symptoms:No data recorded Hallucinations:Hallucinations: Auditory; Visual (seeing and hearing " baby cats")  Ideas of Reference:Delusions  Suicidal Thoughts:Suicidal Thoughts: No  Homicidal Thoughts:Homicidal Thoughts: No   Sensorium  Memory:Immediate Poor; Recent Fair; Remote Fair  Judgment:Impaired  Insight:None   Executive Functions  Concentration:Poor  Attention Span:Poor  Recall:Fair  Fund of Knowledge:Poor  Language:Fair   Psychomotor Activity  Psychomotor Activity:Psychomotor Activity: Tremor (intention tremor when preparing food)   Assets  Assets:Communication Skills; Resilience   Sleep  Sleep:Sleep: Poor    Physical Exam: Physical Exam HENT:     Head: Normocephalic  and atraumatic.  Pulmonary:     Effort: Pulmonary effort is normal.  Neurological:     Mental Status: He is alert.   Review of Systems  Psychiatric/Behavioral:  Negative for hallucinations and suicidal ideas.   Blood pressure 106/75, pulse 78, temperature 97.9 F (36.6 C), temperature source Oral, resp. rate 20, height 5\' 8"  (1.727 m), weight 64.3 kg, SpO2 100 %. Body mass index is 21.55 kg/m.

## 2021-03-26 NOTE — Progress Notes (Signed)
EEG complete - results pending 

## 2021-03-27 LAB — COMPREHENSIVE METABOLIC PANEL
ALT: 97 U/L — ABNORMAL HIGH (ref 0–44)
AST: 60 U/L — ABNORMAL HIGH (ref 15–41)
Albumin: 3.2 g/dL — ABNORMAL LOW (ref 3.5–5.0)
Alkaline Phosphatase: 39 U/L (ref 38–126)
Anion gap: 6 (ref 5–15)
BUN: 20 mg/dL (ref 6–20)
CO2: 27 mmol/L (ref 22–32)
Calcium: 9.1 mg/dL (ref 8.9–10.3)
Chloride: 103 mmol/L (ref 98–111)
Creatinine, Ser: 0.61 mg/dL (ref 0.61–1.24)
GFR, Estimated: >60 mL/min (ref >60)
Glucose, Bld: 105 mg/dL — ABNORMAL HIGH (ref 70–99)
Potassium: 3.9 mmol/L (ref 3.5–5.1)
Sodium: 136 mmol/L (ref 135–145)
Total Bilirubin: 0.3 mg/dL (ref 0.3–1.2)
Total Protein: 6.3 g/dL — ABNORMAL LOW (ref 6.5–8.1)

## 2021-03-27 LAB — CBC WITH DIFFERENTIAL/PLATELET
Abs Immature Granulocytes: 0.01 10*3/uL (ref 0.00–0.07)
Basophils Absolute: 0 10*3/uL (ref 0.0–0.1)
Basophils Relative: 1 %
Eosinophils Absolute: 0.3 10*3/uL (ref 0.0–0.5)
Eosinophils Relative: 6 %
HCT: 32.4 % — ABNORMAL LOW (ref 39.0–52.0)
Hemoglobin: 11 g/dL — ABNORMAL LOW (ref 13.0–17.0)
Immature Granulocytes: 0 %
Lymphocytes Relative: 44 %
Lymphs Abs: 2.1 10*3/uL (ref 0.7–4.0)
MCH: 32.7 pg (ref 26.0–34.0)
MCHC: 34 g/dL (ref 30.0–36.0)
MCV: 96.4 fL (ref 80.0–100.0)
Monocytes Absolute: 0.8 10*3/uL (ref 0.1–1.0)
Monocytes Relative: 16 %
Neutro Abs: 1.6 10*3/uL — ABNORMAL LOW (ref 1.7–7.7)
Neutrophils Relative %: 33 %
Platelets: 257 10*3/uL (ref 150–400)
RBC: 3.36 MIL/uL — ABNORMAL LOW (ref 4.22–5.81)
RDW: 14.4 % (ref 11.5–15.5)
WBC: 4.8 10*3/uL (ref 4.0–10.5)
nRBC: 0 % (ref 0.0–0.2)

## 2021-03-27 MED ORDER — STERILE WATER FOR INJECTION IJ SOLN
INTRAMUSCULAR | Status: AC
  Filled 2021-03-27: qty 10

## 2021-03-27 NOTE — Consult Note (Signed)
[facility] Face-to-Face Psychiatry Consult   Reason for Consult:Severe night time agitation Referring Physician:  Danielle Dess Patient Identification: Steve [name] MRN:  [id number] Principal Diagnosis: Wernicke's encephalopathy Diagnosis:  Principal Problem:   Cognitive impairment  2/2 Wernicke's encephalopathy and alcoholic dementia Active Problems:   Skin abnormalities/perineal MASD   Physical deconditioning 2/2 ambulatory dysfunction   Right ventricular dysfunction   Witnessed seizure ([name])   Protein calorie malnutrition ([name])   Ambulatory dysfunction   Eczema   Pressure injury of skin  Total Time Spent in Direct Patient Care:  I personally spent 25 minutes on the unit in direct patient care. The direct patient care time included face-to-face time with the patient, reviewing the patient's chart, communicating with other professionals, and coordinating care. Greater than 50% of this time was spent in counseling or coordinating care with the patient regarding goals of hospitalization, psycho-education, and discharge planning needs.   HPI:  Steve [name] is a 56 y.o. male patient with a past psychiatric history significant for MDD, anxiety who initially presented to ED on 12/20 for full body seizure, AMS and combativeness.  Per collateral patient was not acting typical.  Patient was found to have rhabdomyolysis leading to uremia and encephalopathy, and abnormal LFTs.  Initial psych consult MDD affecting care vs. Malingering behavior.  Patient is on Prozac, BuSpar, Neurontin, Seroquel (not a home medication).  Seroquel was started in the hospital for encephalopathy.  Patient home medication include Neurontin 400 mg 3 times daily, Vistaril 50 mg every 4 hours needed for anxiety, Prozac 40 mg daily and trazodone 50 mg at bedtime.   Subjective: "I am feeling good today."  Patient is seen today.  Patient is awake and alert.  He states the date as April 07, 2021 but is otherwise oriented.  He states  that the medications are working well and his anxiety is staying level at his current medication dose, in controlling his racing thoughts.  He requests to keep the medications the same today.  Patient denies auditory and visual hallucinations, but then is clearly responding to internal stimuli talking to his brother Renae Fickle who he points to and states that he is not wearing a shirt or shoes.  He seems confused when told that this writer cannot see his brother Renae Fickle.  Patient is specifically denying any suicidal or homicidal ideation. Sitter at bedside states that patient has had no behavioral concerns today.  He does attempt to get out of bed and wander, but is redirectable.  From initial consult: He reports that he drinks alcohol occasionally but does not use any illicit drugs.  He was compliant with his psych medications.  Patient was at [facility] [place] for last 1 month.  He has a son who lives in [place].  Patient states today that his sons have not visited.  Past Psychiatric History: MDD, anxiety.  Vistaril 50 mg every 4 hours needed for anxiety, Prozac 40 mg daily and trazodone 50 mg at bedtime.  Risk to Self:   Risk to Others:   Prior Inpatient Therapy:   Prior Outpatient Therapy:    Past Medical History: History reviewed. No pertinent past medical history. History reviewed. No pertinent surgical history. Family History: History reviewed. No pertinent family history.  Family Psychiatric  History: None noted  Social History:  Social History   Substance and Sexual Activity  Alcohol Use None     Social History   Substance and Sexual Activity  Drug Use Not on file    Social  History   Socioeconomic History   Marital status: Unknown    Spouse name: Not on file   Number of children: Not on file   Years of education: Not on file   Highest education level: Not on file  Occupational History   Not on file  Tobacco Use   Smoking status: Every Day    Packs/day: 0.50    Types:  Cigarettes   Smokeless tobacco: Current  Substance and Sexual Activity   Alcohol use: Not on file   Drug use: Not on file   Sexual activity: Not on file  Other Topics Concern   Not on file  Social History Narrative   Not on file   Social Determinants of Health   Financial Resource Strain: Not on file  Food Insecurity: Not on file  Transportation Needs: Not on file  Physical Activity: Not on file  Stress: Not on file  Social Connections: Not on file   Additional Social History:   Essentially homeless   Allergies:  No Known Allergies  Labs:  Results for orders placed or performed during the hospital encounter of 01/05/21 (from the past 48 hour(s))  Comprehensive metabolic panel     Status: Abnormal   Collection Time: 03/27/21  1:48 AM  Result Value Ref Range   Sodium 136 135 - 145 mmol/L   Potassium 3.9 3.5 - 5.1 mmol/L   Chloride 103 98 - 111 mmol/L   CO2 27 22 - 32 mmol/L   Glucose, Bld 105 (H) 70 - 99 mg/dL    Comment: Glucose reference range applies only to samples taken after fasting for at least 8 hours.   BUN 20 6 - 20 mg/dL   Creatinine, Ser 1.61 0.61 - 1.24 mg/dL   Calcium 9.1 8.9 - 09.6 mg/dL   Total Protein 6.3 (L) 6.5 - 8.1 g/dL   Albumin 3.2 (L) 3.5 - 5.0 g/dL   AST 60 (H) 15 - 41 U/L   ALT 97 (H) 0 - 44 U/L   Alkaline Phosphatase 39 38 - 126 U/L   Total Bilirubin 0.3 0.3 - 1.2 mg/dL   GFR, Estimated >04 >54 mL/min    Comment: (NOTE) Calculated using the CKD-EPI Creatinine Equation (2021)    Anion gap 6 5 - 15    Comment: Performed at [facility], [address] [place], [place] [address]  CBC with Differential/Platelet     Status: Abnormal   Collection Time: 03/27/21  1:48 AM  Result Value Ref Range   WBC 4.8 4.0 - 10.5 K/uL   RBC 3.36 (L) 4.22 - 5.81 MIL/uL   Hemoglobin 11.0 (L) 13.0 - 17.0 g/dL   HCT 91.4 (L) 78.2 - 95.6 %   MCV 96.4 80.0 - 100.0 fL   MCH 32.7 26.0 - 34.0 pg   MCHC 34.0 30.0 - 36.0 g/dL   RDW 21.3 08.6 - 57.8 %    Platelets 257 150 - 400 K/uL   nRBC 0.0 0.0 - 0.2 %   Neutrophils Relative % 33 %   Neutro Abs 1.6 (L) 1.7 - 7.7 K/uL   Lymphocytes Relative 44 %   Lymphs Abs 2.1 0.7 - 4.0 K/uL   Monocytes Relative 16 %   Monocytes Absolute 0.8 0.1 - 1.0 K/uL   Eosinophils Relative 6 %   Eosinophils Absolute 0.3 0.0 - 0.5 K/uL   Basophils Relative 1 %   Basophils Absolute 0.0 0.0 - 0.1 K/uL   Immature Granulocytes 0 %   Abs  Immature Granulocytes 0.01 0.00 - 0.07 K/uL    Comment: Performed at [facility], [address] [place], [place] [address]    Current Facility-Administered Medications  Medication Dose Route Frequency Provider Last Rate Last Admin   acetaminophen (TYLENOL) tablet 650 mg  650 mg Oral Q4H PRN Marinda ElkShalhoub, George J, MD   650 mg at 03/25/21 1120   amLODipine (NORVASC) tablet 2.5 mg  2.5 mg Oral Daily Dahal, Binaya, MD   2.5 mg at 03/27/21 [id number]   aspirin tablet 325 mg  325 mg Oral Daily Rejeana BrockKirkpatrick, McNeill P, MD   325 mg at 03/27/21 0908   clonazePAM (KLONOPIN) tablet 0.5 mg  0.5 mg Oral 2 times per day Leroy SeaSingh, Prashant K, MD   0.5 mg at 03/27/21 0908   docusate sodium (COLACE) capsule 100 mg  100 mg Oral BID Charlott Holleresai, Nikita S, MD   100 mg at 03/27/21 0908   feeding supplement (ENSURE ENLIVE / ENSURE PLUS) liquid 237 mL  237 mL Oral TID AC & HS Russella DarEllis, Allison L, NP   237 mL at 03/27/21 0913   FLUoxetine (PROZAC) capsule 40 mg  40 mg Oral Daily Dahal, Melina SchoolsBinaya, MD   40 mg at 03/27/21 0909   folic acid (FOLVITE) tablet 1 mg  1 mg Oral Daily Vann, Jessica U, DO   1 mg at 03/27/21 0908   levETIRAcetam (KEPPRA) 100 MG/ML solution 500 mg  500 mg Oral BID Burnadette PopAdhikari, Amrit, MD   500 mg at 03/27/21 0909   MEDLINE mouth rinse  15 mL Mouth Rinse BID Olalere, Adewale A, MD   15 mL at 03/27/21 0913   melatonin tablet 10 mg  10 mg Oral QHS Maretta BeesGhimire, Shanker M, MD   10 mg at 03/26/21 2131   nicotine (NICODERM CQ - dosed in mg/24 hours) patch 21 mg  21 mg Transdermal Daily Dahal, Binaya, MD   21 mg at  03/27/21 0915   OLANZapine (ZYPREXA) injection 5 mg  5 mg Intramuscular Q8H PRN Cinderella, Margaret A   5 mg at 03/27/21 0250   OLANZapine (ZYPREXA) tablet 10 mg  10 mg Oral QHS Eliseo GumMcQuilla, Jai B, MD   10 mg at 03/26/21 2130   OLANZapine (ZYPREXA) tablet 5 mg  5 mg Oral Daily Eliseo GumMcQuilla, Jai B, MD   5 mg at 03/27/21 0908   ondansetron (ZOFRAN) injection 4 mg  4 mg Intravenous Q8H PRN Oretha MilchKamat, Sunil G, MD   4 mg at 01/15/21 0208   polyethylene glycol (MIRALAX / GLYCOLAX) packet 17 g  17 g Oral Daily Charlott Holleresai, Nikita S, MD   17 g at 03/27/21 0908   risperiDONE (RISPERDAL M-TABS) disintegrating tablet 1 mg  1 mg Oral QHS Eliseo GumMcQuilla, Jai B, MD   1 mg at 03/26/21 2130   rivaroxaban (XARELTO) tablet 10 mg  10 mg Oral Daily Russella DarEllis, Allison L, NP   10 mg at 03/27/21 0909   selenium sulfide (SELSUN) 1 % shampoo   Topical Daily Russella DarEllis, Allison L, NP   Given at 03/26/21 0900   thiamine tablet 250 mg  250 mg Oral Daily Maretta BeesGhimire, Shanker M, MD   250 mg at 03/27/21 0908   traZODone (DESYREL) tablet 100 mg  100 mg Oral QHS Eliseo GumMcQuilla, Jai B, MD   100 mg at 03/26/21 2130   valproic acid (DEPAKENE) 250 MG capsule 500 mg  500 mg Oral BID Leroy SeaSingh, Prashant K, MD   500 mg at 03/27/21 0908    Musculoskeletal: Strength & Muscle Tone:  Not assessed Gait & Station:  Deferred Patient leans: N/A, patient had been            Psychiatric Specialty Exam:  Presentation  General Appearance: Appropriate for Environment  Eye Contact:Good  Speech:Clear and Coherent  Speech Volume:Normal  Handedness:No data recorded  Mood and Affect  Mood:Euthymic  Affect:Congruent   Thought Process  Thought Processes:Disorganized  Descriptions of Associations:Circumstantial  Orientation:Partial  Thought Content:Illogical  History of Schizophrenia/Schizoaffective disorder:No data recorded Duration of Psychotic Symptoms:No data recorded Hallucinations:Hallucinations: Auditory; Visual Description of Auditory Hallucinations:  Brother talking to him Description of Visual Hallucinations: Sees hi brother, Renae Fickle   Ideas of Reference:None  Suicidal Thoughts:Suicidal Thoughts: No    Homicidal Thoughts:Homicidal Thoughts: No    Sensorium  Memory:Immediate Fair; Recent Fair; Remote Fair  Judgment:Impaired  Insight:Shallow   Executive Functions  Concentration:Fair  Attention Span:Fair  Recall:Fair  Fund of Knowledge:Fair  Language:Good   Psychomotor Activity  Psychomotor Activity:Psychomotor Activity: Restlessness    Assets  Assets:Resilience   Sleep  Sleep:Sleep: Good    Physical Exam: Physical Exam Vitals and nursing note reviewed.  HENT:     Head: Normocephalic.  Cardiovascular:     Rate and Rhythm: Normal rate.  Pulmonary:     Effort: Pulmonary effort is normal. No respiratory distress.  Neurological:     General: No focal deficit present.     Mental Status: He is alert.   Review of Systems  Reason unable to perform ROS: Due to limited Pt interation.  Psychiatric/Behavioral:  Positive for hallucinations (seeing and talking to his brother, Renae Fickle). Negative for depression and suicidal ideas. The patient is not nervous/anxious and does not have insomnia.   Blood pressure 101/70, pulse 83, temperature 98 F (36.7 C), temperature source Oral, resp. rate 16, height 5\' 8"  (1.727 m), weight 64.3 kg, SpO2 100 %. Body mass index is 21.55 kg/m.  Treatment Plan Summary: Steve [name] is a 56 y.o. male patient with past psychiatric history significant for MDD, anxiety who initially presented to ED on 12/20 for full body seizure, AMS and combativeness.  Per collateral patient was not acting typical.  Patient was found to have rhabdomyolysis leading to uremia and encephalopathy, and abnormal LFTs.  Psych consult MDD affecting care ?Malingering behavior.   Patient is very guarded and selectively mute. He only spoke when he wanted to give some information.  There  may be likely an element of  secondary gain. Will need more collateral information from son and other sources. CSW informed.  Limited collateral information available at this time. TOC involved per CSW.  Recommendations  ## Safety and Observation Level:  - Based on my clinical evaluation, I estimate the patient to be at moderate risk of self harm in the current setting, but requires redirection to keep himself from unintentional self harm. - At this time, we recommend a close level of observation. This decision is based on my review of the chart including patient's history and current presentation, interview of the patient, mental status examination, and consideration of suicide risk including evaluating suicidal ideation, plan, intent, suicidal or self-harm behaviors, risk factors, and protective factors. This judgment is based on our ability to directly address suicide risk, implement suicide prevention strategies and develop a safety plan while the patient is in the clinical setting. Please contact our team if there is a concern that risk level has changed.     ## Medications:   No medication changes today, 03/27/2021 -- Continue klonopin 0.5mg  BID -- Continue Prozac  40mg  - Continue Melatonin 10mg  QHS - Continue Trazodone 100mg  QHS  -- Decrease Zyprexa to 5mg  daily and 10mg  QHS -- Continue Depakene 500mg  BID - Continue Risperdal 1mg  QHS -- Recommend 3 point restraints this AM - May continue Zyprexa 5mg  q8h PRN for agitation   ## Medical Decision Making Capacity:  -- Not formally assessed   ## Further Work-up:  -- Per primary  -- Recommend updated EKG, resulted below   -- most recent EKG on 3/8 had QtC of 462 -- Pertinent labwork reviewed earlier this admission includes: Depakote: 37 Most recent labs: CMP (sodium 138), LFT's trending up (3/8( AST 59, ALT 89).  PT-INR WNL 3/4 CBC with low H/H and normalizing MCHC; Magnesium now WNL   ## Disposition:  -- Per primary   ## Behavioral / Environmental:  1:  Provide appropriate lighting and clear signage; a clock and calendar should be easily visible to the patient. 2: Continue to provide activities for patient ( coloring, word search, book) 3: Reorient the patient to person, place, time and situation on each encounter.  4: Minimize use of Antipsychotics PRN. Patient benefits from constant observation by a sitter.   ##Legal Status     Thank you for this consult request. Recommendations have been communicated to the primary team.  We will continue to follow at this time.   Disposition: Patient does not meet criteria for psychiatric inpatient admission.   , MD 03/27/2021 1:59 PM

## 2021-03-27 NOTE — Progress Notes (Signed)
?Progress Note ? ? ?PatientRoxas [name] [id number] DOB: 1966/01/10 DOA: 01/05/2021     81 ?DOS: the patient was seen and examined on 03/27/2021 ?  ?Brief hospital course: ? ?Steve [name] is a 56 y.o. male with history of major depression, anxiety who was in a residential ETOH treatment center for last 5 months. He was brought to the ED on 12/20 from a residential ETOH treatment center for convalescence, combativeness and altered sensorium.  Per collateral history, patient was not acting typical for last few days. ? ?In the ED, patient was confused, combative. Labs showed AKI with creatinine of 3.27, rhabdomyolysis, abnormal LFTs, elevated WBC count at 23.7 and lactic acid level at 7.6. 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 Blood culture was sent, empiric antibiotic started.  IV fluid resuscitation started and patient was initially admitted to ICU. ?12/21, underwent LP which showed staph in CSF, felt to be contaminant.  EEG did not show any seizures ?12/24, HD catheter was placed but patient did not require HD as creatinine started to improve ?12/31, with clinical improvement, patient was transferred out of ICU to [facility]. ? ? ?Subjective: ?Events overnight as discussed with staff, he required to go on restraints overnight, but daytime he did not require restraints and ambulated in the hallway with a sitter.   ? ?Assessment and Plan: ? ?Cognitive impairment  2/2 Wernicke's encephalopathy and alcoholic dementia  ?-   Initial MRI revealed tiny acute infarct on the right but not significant enough to explain persistent cognitive and behavioral abnormalities. Also evidence of prior lacunar infarct right basal ganglia, was seen by Neuro. seen by both neurology and psychiatry.  Had was extremely sedated earlier in the week of 03/10/2021.  Psych and neurology were reconsulted.   ?-Psych follow-up  greatly appreciated, continue with Klonopin, Prozac, Zyprexa and Depakene, ?-Greatly appreciated, and their assistance with medications, he is being tried to wean off Zyprexa and started on Risperdal instead. ?-EEG obtained 3/10 with no evidence of seizure activities. ? ?Physical deconditioning 2/2 ambulatory dysfunction - needs SNF due to combination of severe deconditioning and poor cognition due to #1 above. ?  ?Witnessed seizure   - No apparent prior history of seizure disorder noting patient was not on AEDs prior to admission,  Unclear if withdrawal seizure or related to other substances, EEG unremarkable.   Continue Keppra at recommendation of neurology. ? ?Acute kidney injury with rhabdomyolysis -  Resolved. Nephrology was consulted with initial plans to initiate dialysis including placement of [facility] but renal function gradually improved and dialysis not needed and TDC removed. ? ?Moderate Protein calorie malnutrition  - on protein supplementation. ?  ?Possible DVT   - Per critical care note, hemodialysis cath had a clot on it when it was removed.  UE duplex  12/31: no DVT.  Acting Xarelto. ? ?Right ventricular dysfunction - Echo 1/2 with EF 60 to 65%.  Cannot rule out a small PFO this finding determined to be clinically insignificant given the patient was asymptomatic ? ?Aspiration pneumonia - resolved as of 03/04/2021 -   Completed a course of Zosyn ? ?Severe sepsis without septic shock ([name])  Resolved ?  ?Eczema - much better on steroid cream ? ?  ? ?Objective  ? ?Physical Exam: ? ?Vitals:  ? 03/26/21 2340 03/27/21 0342 03/27/21 0909 03/27/21 0922  ?BP: 98/69 (!) 86/54 101/70 101/70  ?Pulse: 88 79  81  ?  Resp: 18 19  18   ?Temp: 97.7 ?F (36.5 ?C) 97.8 ?F (36.6 ?C)  97.6 ?F (36.4 ?C)  ?TempSrc: Oral Oral  Oral  ?SpO2: 99% 99%  100%  ?Weight:      ?Height:      ? ? ?Is awake today , more cooperative, pleasant, confused with confabulation ?Symmetrical Chest wall movement, Good air movement bilaterally,  CTAB ?RRR,No Gallops,Rubs or new Murmurs, No Parasternal Heave ?+ve B.Sounds, Abd Soft, No tenderness, No rebound - guarding or rigidity. ?No Cyanosis, Clubbing or edema, No new Rash or bruise   ? ? ? ? ? ? ?Recent Labs  ?Lab 03/27/21 ?0148  ?WBC 4.8  ?HGB 11.0*  ?HCT 32.4*  ?PLT 257  ?MCV 96.4  ?MCH 32.7  ?MCHC 34.0  ?RDW 14.4  ?LYMPHSABS 2.1  ?MONOABS 0.8  ?EOSABS 0.3  ?BASOSABS 0.0  ? ? ? ?Recent Labs  ?Lab 03/21/21 ?0333 03/24/21 ?1133 03/27/21 ?0148  ?NA 138  --  136  ?K 3.7  --  3.9  ?CL 102  --  103  ?CO2 29  --  27  ?GLUCOSE 94  --  105*  ?BUN 24*  --  20  ?CREATININE 0.58*  --  0.61  ?CALCIUM 9.2  --  9.1  ?AST 53* 59* 60*  ?ALT 78* 89* 97*  ?ALKPHOS 40 40 39  ?BILITOT 0.4 0.2* 0.3  ?ALBUMIN 3.5 3.5 3.2*  ?INR 1.1  --   --   ? ? ?DVT Prophylaxis  .Rivaroxaban (xarelto) tablet 10 mg , Rivaroxaban (xarelto) tablet 10 mg  ? ?Planned Discharge Destination:  ?Skilled nursing facility ? ?Medically stable: ?No-still titrating behavioral meds ? ?COVID vaccination status:  ?Unknown ? ?Consultants: ?Nephrology ?Psychiatry ?  ? ?Procedures: ?EEG ?Echocardiogram ?TDC ? ? Antibiotics: ?Unasyn x1 dose, cefepime x1 dose, ceftriaxone x1 dose, vancomycin x2 doses ? ?Scheduled Meds: ? ? amLODipine  2.5 mg Oral Daily  ? aspirin  325 mg Oral Daily  ? clonazePAM  0.5 mg Oral 2 times per day  ? docusate sodium  100 mg Oral BID  ? feeding supplement  237 mL Oral TID AC & HS  ? FLUoxetine  40 mg Oral Daily  ? folic acid  1 mg Oral Daily  ? levETIRAcetam  500 mg Oral BID  ? mouth rinse  15 mL Mouth Rinse BID  ? melatonin  10 mg Oral QHS  ? nicotine  21 mg Transdermal Daily  ? OLANZapine  10 mg Oral QHS  ? OLANZapine  5 mg Oral Daily  ? polyethylene glycol  17 g Oral Daily  ? risperiDONE  1 mg Oral QHS  ? rivaroxaban  10 mg Oral Daily  ? selenium sulfide   Topical Daily  ? thiamine  250 mg Oral Daily  ? traZODone  100 mg Oral QHS  ? valproic acid  500 mg Oral BID  ? ?Continuous Infusions: ?PRN Meds:.acetaminophen, [DISCONTINUED]  OLANZapine zydis **OR** OLANZapine, ondansetron (ZOFRAN) IV  ? ? ?Signature ? ?Phillips Climes M.D on 03/27/2021 at 12:15 PM   -  To page go to [email or web address]  ? ?  ? ? ?

## 2021-03-28 MED ORDER — STERILE WATER FOR INJECTION IJ SOLN
INTRAMUSCULAR | Status: AC
  Filled 2021-03-28: qty 10

## 2021-03-28 NOTE — Consult Note (Signed)
[facility] Face-to-Face Psychiatry Consult   Reason for Consult:Severe night time agitation Referring Physician:  Danielle Dess Patient Identification: Steve [name] MRN:  [id number] Principal Diagnosis: Wernicke's encephalopathy Diagnosis:  Principal Problem:   Cognitive impairment  2/2 Wernicke's encephalopathy and alcoholic dementia Active Problems:   Skin abnormalities/perineal MASD   Physical deconditioning 2/2 ambulatory dysfunction   Right ventricular dysfunction   Witnessed seizure ([name])   Protein calorie malnutrition ([name])   Ambulatory dysfunction   Eczema   Pressure injury of skin  Total Time Spent in Direct Patient Care:  I personally spent 35 minutes on the unit in direct patient care. The direct patient care time included face-to-face time with the patient, reviewing the patient's chart, communicating with other professionals, and coordinating care. Greater than 50% of this time was spent in counseling or coordinating care with the patient regarding goals of hospitalization, psycho-education, and discharge planning needs.   HPI:  Steve [name] is a 56 y.o. male patient with a past psychiatric history significant for MDD, anxiety who initially presented to ED on 12/20 for full body seizure, AMS and combativeness.  Per collateral patient was not acting typical.  Patient was found to have rhabdomyolysis leading to uremia and encephalopathy, and abnormal LFTs.  Initial psych consult MDD affecting care vs. Malingering behavior.  Patient is on Prozac, BuSpar, Neurontin, Seroquel (not a home medication).  Seroquel was started in the hospital for encephalopathy.  Patient home medication include Neurontin 400 mg 3 times daily, Vistaril 50 mg every 4 hours needed for anxiety, Prozac 40 mg daily and trazodone 50 mg at bedtime.   Subjective: "You are the psychiatrist"  Patient is seen today.  Patient is awake and alert, sitting in a chair with a chair alarm in place and eating his lunch which is  almost completed.  He states the date as April 08, 2021 but is otherwise oriented.  He reports that he "slept like a rock", and denies adverse side effects to current medications. He requests to keep the medications the same today.  Patient denies auditory and visual hallucinations.  When asked about his brother Renae Fickle whom he was seen and hearing yesterday, he notes that he has not continued to see or hear him.  He denies auditory or visual hallucinations, but then states that he is annoyed with some people who raise their voices.  When questioned if this is staff, he denies and says that "I will talk to [place] about them", and gestures towards the air.  He continues to respond to internal stimuli, but has not been agitated. Patient is specifically denying any suicidal or homicidal ideation.  He asks about discharge planning and if there has been a place found for him yet.  Staffing is now allowed for a sitter at bedside.  Nursing staff has kept patient on close observation on falls protocol.  Patient has had no agitated behaviors or other behavioral concerns today.  He has been able to ambulate with assistance in his room into the bathroom.  He has been redirectable.  Nursing staff denies oversedation with medication, and agrees that patient is stabilized at current medication dosages.   From initial consult: He reports that he drinks alcohol occasionally but does not use any illicit drugs.  He was compliant with his psych medications.  [facility] was at [facility] [place] for last 1 month.  He has a son who lives in [place].    Past Psychiatric History: MDD, anxiety.  Vistaril 50 mg every 4 hours  needed for anxiety, Prozac 40 mg daily and trazodone 50 mg at bedtime.  Risk to Self:   Risk to Others:   Prior Inpatient Therapy:   Prior Outpatient Therapy:    Past Medical History: History reviewed. No pertinent past medical history. History reviewed. No pertinent surgical history. Family History:  History reviewed. No pertinent family history.  Family Psychiatric  History: None noted  Social History:  Social History   Substance and Sexual Activity  Alcohol Use None     Social History   Substance and Sexual Activity  Drug Use Not on file    Social History   Socioeconomic History   Marital status: Unknown    Spouse name: Not on file   Number of children: Not on file   Years of education: Not on file   Highest education level: Not on file  Occupational History   Not on file  Tobacco Use   Smoking status: Every Day    Packs/day: 0.50    Types: Cigarettes   Smokeless tobacco: Current  Substance and Sexual Activity   Alcohol use: Not on file   Drug use: Not on file   Sexual activity: Not on file  Other Topics Concern   Not on file  Social History Narrative   Not on file   Social Determinants of Health   Financial Resource Strain: Not on file  Food Insecurity: Not on file  Transportation Needs: Not on file  Physical Activity: Not on file  Stress: Not on file  Social Connections: Not on file   Additional Social History:   Essentially homeless   Allergies:  No Known Allergies  Labs:  Results for orders placed or performed during the hospital encounter of 01/05/21 (from the past 48 hour(s))  Comprehensive metabolic panel     Status: Abnormal   Collection Time: 03/27/21  1:48 AM  Result Value Ref Range   Sodium 136 135 - 145 mmol/L   Potassium 3.9 3.5 - 5.1 mmol/L   Chloride 103 98 - 111 mmol/L   CO2 27 22 - 32 mmol/L   Glucose, Bld 105 (H) 70 - 99 mg/dL    Comment: Glucose reference range applies only to samples taken after fasting for at least 8 hours.   BUN 20 6 - 20 mg/dL   Creatinine, Ser 1.610.61 0.61 - 1.24 mg/dL   Calcium 9.1 8.9 - 09.610.3 mg/dL   Total Protein 6.3 (L) 6.5 - 8.1 g/dL   Albumin 3.2 (L) 3.5 - 5.0 g/dL   AST 60 (H) 15 - 41 U/L   ALT 97 (H) 0 - 44 U/L   Alkaline Phosphatase 39 38 - 126 U/L   Total Bilirubin 0.3 0.3 - 1.2 mg/dL    GFR, Estimated >04>60 >54>60 mL/min    Comment: (NOTE) Calculated using the CKD-EPI Creatinine Equation (2021)    Anion gap 6 5 - 15    Comment: Performed at [facility], [address] [place], [place] [address]  CBC with Differential/Platelet     Status: Abnormal   Collection Time: 03/27/21  1:48 AM  Result Value Ref Range   WBC 4.8 4.0 - 10.5 K/uL   RBC 3.36 (L) 4.22 - 5.81 MIL/uL   Hemoglobin 11.0 (L) 13.0 - 17.0 g/dL   HCT 91.432.4 (L) 78.239.0 - 95.652.0 %   MCV 96.4 80.0 - 100.0 fL   MCH 32.7 26.0 - 34.0 pg   MCHC 34.0 30.0 - 36.0 g/dL   RDW 21.314.4 08.611.5 -  15.5 %   Platelets 257 150 - 400 K/uL   nRBC 0.0 0.0 - 0.2 %   Neutrophils Relative % 33 %   Neutro Abs 1.6 (L) 1.7 - 7.7 K/uL   Lymphocytes Relative 44 %   Lymphs Abs 2.1 0.7 - 4.0 K/uL   Monocytes Relative 16 %   Monocytes Absolute 0.8 0.1 - 1.0 K/uL   Eosinophils Relative 6 %   Eosinophils Absolute 0.3 0.0 - 0.5 K/uL   Basophils Relative 1 %   Basophils Absolute 0.0 0.0 - 0.1 K/uL   Immature Granulocytes 0 %   Abs Immature Granulocytes 0.01 0.00 - 0.07 K/uL    Comment: Performed at [facility], [address] [place], [place] [address]    Current Facility-Administered Medications  Medication Dose Route Frequency Provider Last Rate Last Admin   acetaminophen (TYLENOL) tablet 650 mg  650 mg Oral Q4H PRN Marinda Elk, MD   650 mg at 03/28/21 1523   aspirin tablet 325 mg  325 mg Oral Daily Rejeana Brock, MD   325 mg at 03/28/21 0816   clonazePAM (KLONOPIN) tablet 0.5 mg  0.5 mg Oral 2 times per day Leroy Sea, MD   0.5 mg at 03/28/21 1523   docusate sodium (COLACE) capsule 100 mg  100 mg Oral BID Charlott Holler, MD   100 mg at 03/27/21 2225   feeding supplement (ENSURE ENLIVE / ENSURE PLUS) liquid 237 mL  237 mL Oral TID AC & HS Russella Dar, NP   237 mL at 03/28/21 0816   FLUoxetine (PROZAC) capsule 40 mg  40 mg Oral Daily Dahal, Melina Schools, MD   40 mg at 03/28/21 0816   folic acid (FOLVITE) tablet  1 mg  1 mg Oral Daily Vann, Jessica U, DO   1 mg at 03/28/21 0816   levETIRAcetam (KEPPRA) 100 MG/ML solution 500 mg  500 mg Oral BID Burnadette Pop, MD   500 mg at 03/28/21 0942   MEDLINE mouth rinse  15 mL Mouth Rinse BID Olalere, Adewale A, MD   15 mL at 03/28/21 0818   melatonin tablet 10 mg  10 mg Oral QHS Maretta Bees, MD   10 mg at 03/27/21 2225   nicotine (NICODERM CQ - dosed in mg/24 hours) patch 21 mg  21 mg Transdermal Daily Dahal, Melina Schools, MD   21 mg at 03/28/21 0816   OLANZapine (ZYPREXA) injection 5 mg  5 mg Intramuscular Q8H PRN Cinderella, Margaret A   5 mg at 03/27/21 0250   OLANZapine (ZYPREXA) tablet 10 mg  10 mg Oral QHS Eliseo Gum B, MD   10 mg at 03/27/21 2224   OLANZapine (ZYPREXA) tablet 5 mg  5 mg Oral Daily Eliseo Gum B, MD   5 mg at 03/28/21 0944   ondansetron (ZOFRAN) injection 4 mg  4 mg Intravenous Q8H PRN Oretha Milch, MD   4 mg at 01/15/21 0208   polyethylene glycol (MIRALAX / GLYCOLAX) packet 17 g  17 g Oral Daily Charlott Holler, MD   17 g at 03/27/21 0908   risperiDONE (RISPERDAL M-TABS) disintegrating tablet 1 mg  1 mg Oral QHS Eliseo Gum B, MD   1 mg at 03/27/21 2225   rivaroxaban (XARELTO) tablet 10 mg  10 mg Oral Daily Russella Dar, NP   10 mg at 03/28/21 0947   selenium sulfide (SELSUN) 1 % shampoo   Topical Daily Russella Dar, NP   Given  at 03/28/21 0941   sterile water (preservative free) injection            thiamine tablet 250 mg  250 mg Oral Daily Maretta Bees, MD   250 mg at 03/28/21 0816   traZODone (DESYREL) tablet 100 mg  100 mg Oral QHS Eliseo Gum B, MD   100 mg at 03/27/21 2224   valproic acid (DEPAKENE) 250 MG capsule 500 mg  500 mg Oral BID Leroy Sea, MD   500 mg at 03/28/21 1093    Musculoskeletal: Strength & Muscle Tone:  Not assessed Gait & Station:  Deferred Patient leans: N/A, patient had been            Psychiatric Specialty Exam:  Presentation  General Appearance: Appropriate for  Environment  Eye Contact:Good  Speech:Clear and Coherent  Speech Volume:Normal  Handedness:No data recorded  Mood and Affect  Mood:Euthymic  Affect:Congruent   Thought Process  Thought Processes:Other (comment) (Confabulations) Improved at time of assessment  Descriptions of Associations:Circumstantial  Orientation:Partial  Thought Content:Illogical  History of Schizophrenia/Schizoaffective disorder:No data recorded Duration of Psychotic Symptoms:No data recorded Hallucinations:Hallucinations: Auditory Description of Auditory Hallucinations: People raising their voices to him Description of Visual Hallucinations: Denies today   Ideas of Reference:None  Suicidal Thoughts:Suicidal Thoughts: No    Homicidal Thoughts:Homicidal Thoughts: No    Sensorium  Memory:Immediate Fair; Recent Fair; Remote Fair  Judgment:Impaired  Insight:Shallow   Executive Functions  Concentration:Fair  Attention Span:Fair  Recall:Fair  Fund of Knowledge:Fair  Language:Good   Psychomotor Activity  Psychomotor Activity:Psychomotor Activity: Restlessness    Assets  Assets:Resilience   Sleep  Sleep:Sleep: Good    Physical Exam: Physical Exam Vitals and nursing note reviewed.  HENT:     Head: Normocephalic.  Cardiovascular:     Rate and Rhythm: Normal rate.  Pulmonary:     Effort: Pulmonary effort is normal. No respiratory distress.  Neurological:     General: No focal deficit present.     Mental Status: He is alert.   Review of Systems  Constitutional: Negative.   Psychiatric/Behavioral:  Positive for hallucinations (hearing voices). Negative for depression and suicidal ideas. The patient is not nervous/anxious and does not have insomnia.   Blood pressure 93/66, pulse 79, temperature 97.7 F (36.5 C), temperature source Oral, resp. rate 18, height 5\' 8"  (1.727 m), weight 64.3 kg, SpO2 100 %. Body mass index is 21.55 kg/m.  Treatment Plan Summary: Steve [name] is a 56 y.o. male patient with past psychiatric history significant for MDD, anxiety who initially presented to ED on 12/20 for full body seizure, AMS and combativeness.  Per collateral patient was not acting typical.  Patient was found to have rhabdomyolysis leading to uremia and encephalopathy, and abnormal LFTs.  Psych consult MDD affecting care ?Malingering behavior.   Patient is very guarded and selectively mute. He only spoke when he wanted to give some information.  There  may be likely an element of secondary gain. Will need more collateral information from son and other sources. CSW informed.  Limited collateral information available at this time. TOC involved per CSW.  Recommendations  ## Safety and Observation Level:  - Based on my clinical evaluation, I estimate the patient to be at moderate risk of self harm in the current setting, but requires redirection to keep himself from unintentional self harm. - At this time, we recommend a close level of observation. This decision is based on my review of the chart including patient's history and  current presentation, interview of the patient, mental status examination, and consideration of suicide risk including evaluating suicidal ideation, plan, intent, suicidal or self-harm behaviors, risk factors, and protective factors. This judgment is based on our ability to directly address suicide risk, implement suicide prevention strategies and develop a safety plan while the patient is in the clinical setting. Please contact our team if there is a concern that risk level has changed.     ## Medications:   No medication changes today, 03/28/2021 -- Continue klonopin 0.5mg  BID -- Continue Prozac  - Continue Melatonin  QHS - Continue Trazodone  QHS  -- Decrease Zyprexa to  daily and  QHS -- Continue Depakene  BID - Continue Risperdal  QHS -- Recommend 3 point restraints this AM - May continue Zyprexa  q8h PRN  for agitation   ## Medical Decision Making Capacity:  -- Not formally assessed   ## Further Work-up:  -- Per primary  -- Recommend updated EKG, resulted below   -- most recent EKG on 3/8 had QtC of 462 -- Pertinent labwork reviewed earlier this admission includes: Depakote: 37 Most recent labs: CMP (sodium 138), LFT's trending up (3/8( AST 59, ALT 89).  PT-INR WNL 3/4 CBC with low H/H and normalizing MCHC; Magnesium now WNL   ## Disposition:  -- Per primary   ## Behavioral / Environmental:  1: Provide appropriate lighting and clear signage; a clock and calendar should be easily visible to the patient. 2: Continue to provide activities for patient ( coloring, word search, book) 3: Reorient the patient to person, place, time and situation on each encounter.  4: Minimize use of Antipsychotics PRN. Patient benefits from constant observation by a sitter.   ##Legal Status     Thank you for this consult request. Recommendations have been communicated to the primary team.  We will continue to follow at this time.   Disposition: Patient does not meet criteria for psychiatric inpatient admission. Appreciate social work assistance in finding long-term placement.   Mariel Craft, MD 03/28/2021 4:20 PM

## 2021-03-28 NOTE — Progress Notes (Signed)
Dr. Randol Kern gave order to hold norvasc this morning for BP 93/66.  ?

## 2021-03-28 NOTE — Progress Notes (Signed)
?Progress Note ? ? ?PatientNewton [name] FKC:[id number] DOB: [birth year] DOA: 01/05/2021     82 ?DOS: the patient was seen and examined on 03/28/2021 ?  ?Brief hospital course: ? ?Teal Bontrager is a 56 y.o. male with history of major depression, anxiety who was in a residential ETOH treatment center for last 5 months. He was brought to the ED on 12/20 from a residential ETOH treatment center for convalescence, combativeness and altered sensorium.  Per collateral history, patient was not acting typical for last few days. ? ?In the ED, patient was confused, combative. Labs showed AKI with creatinine of 3.27, rhabdomyolysis, abnormal LFTs, elevated WBC count at 23.7 and lactic acid level at 7.6. 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 Blood culture was sent, empiric antibiotic started.  IV fluid resuscitation started and patient was initially admitted to ICU. ?12/21, underwent LP which showed staph in CSF, felt to be contaminant.  EEG did not show any seizures ?12/24, HD catheter was placed but patient did not require HD as creatinine started to improve ?12/31, with clinical improvement, patient was transferred out of ICU to [facility]. ? ? ?Subjective: ? ?No significant events overnight as discussed with staff. ?Assessment and Plan: ? ?Cognitive impairment  2/2 Wernicke's encephalopathy and alcoholic dementia  ?-   Initial MRI revealed tiny acute infarct on the right but not significant enough to explain persistent cognitive and behavioral abnormalities. Also evidence of prior lacunar infarct right basal ganglia, was seen by Neuro. seen by both neurology and psychiatry.  Had was extremely sedated earlier in the week of 03/10/2021.  Psych and neurology were reconsulted.   ?-Psych follow-up greatly appreciated, continue with Klonopin, Prozac, Zyprexa and Depakene, ?-EEG obtained 3/10 with no evidence of  seizure activities. ? ?Physical deconditioning 2/2 ambulatory dysfunction - needs SNF due to combination of severe deconditioning and poor cognition due to #1 above. ?  ?Witnessed seizure   - No apparent prior history of seizure disorder noting patient was not on AEDs prior to admission,  Unclear if withdrawal seizure or related to other substances, EEG unremarkable.   Continue Keppra at recommendation of neurology. ? ?Acute kidney injury with rhabdomyolysis -  Resolved. Nephrology was consulted with initial plans to initiate dialysis including placement of [facility] but renal function gradually improved and dialysis not needed and TDC removed. ? ?Moderate Protein calorie malnutrition  - on protein supplementation. ?  ?Possible DVT   - Per critical care note, hemodialysis cath had a clot on it when it was removed.  UE duplex  12/31: no DVT.  Acting Xarelto. ? ?Right ventricular dysfunction - Echo 1/2 with EF 60 to 65%.  Cannot rule out a small PFO this finding determined to be clinically insignificant given the patient was asymptomatic ? ?Aspiration pneumonia - resolved as of 03/04/2021 -   Completed a course of Zosyn ? ?Severe sepsis without septic shock ([name])  Resolved ?  ?Eczema - much better on steroid cream ? ?  ? ?Objective  ? ?Physical Exam: ? ?Vitals:  ? 03/28/21 0050 03/28/21 7494 03/28/21 0751 03/28/21 0934  ?BP: 99/72 102/73 (!) 88/70 93/66  ?Pulse: 89 68 70 79  ?Resp: 18 18 18    ?Temp: 97.6 ?F (36.4 ?C) 97.9 ?F (36.6 ?C) 97.7 ?F (36.5 ?C)   ?TempSrc: Oral Oral Oral   ?SpO2: 98% 100% 100%   ?Weight:      ?Height:      ? ? ?  Is awake today , more cooperative, pleasant, confused with confabulation ?Symmetrical Chest wall movement, Good air movement bilaterally, CTAB ?RRR,No Gallops,Rubs or new Murmurs, No Parasternal Heave ?+ve B.Sounds, Abd Soft, No tenderness, No rebound - guarding or rigidity. ?No Cyanosis, Clubbing or edema, No new Rash or bruise   ? ? ? ? ? ? ?Recent Labs  ?Lab 03/27/21 ?0148  ?WBC 4.8  ?HGB  11.0*  ?HCT 32.4*  ?PLT 257  ?MCV 96.4  ?MCH 32.7  ?MCHC 34.0  ?RDW 14.4  ?LYMPHSABS 2.1  ?MONOABS 0.8  ?EOSABS 0.3  ?BASOSABS 0.0  ? ? ? ?Recent Labs  ?Lab 03/24/21 ?1133 03/27/21 ?0148  ?NA  --  136  ?K  --  3.9  ?CL  --  103  ?CO2  --  27  ?GLUCOSE  --  105*  ?BUN  --  20  ?CREATININE  --  0.61  ?CALCIUM  --  9.1  ?AST 59* 60*  ?ALT 89* 97*  ?ALKPHOS 40 39  ?BILITOT 0.2* 0.3  ?ALBUMIN 3.5 3.2*  ? ? ?DVT Prophylaxis  .Rivaroxaban (xarelto) tablet 10 mg , Rivaroxaban (xarelto) tablet 10 mg  ? ?Planned Discharge Destination:  ?Skilled nursing facility ? ?Medically stable: ?No-still titrating behavioral meds ? ?COVID vaccination status:  ?Unknown ? ?Consultants: ?Nephrology ?Psychiatry ?  ? ?Procedures: ?EEG ?Echocardiogram ?TDC ? ? Antibiotics: ?Unasyn x1 dose, cefepime x1 dose, ceftriaxone x1 dose, vancomycin x2 doses ? ?Scheduled Meds: ? ? aspirin  325 mg Oral Daily  ? clonazePAM  0.5 mg Oral 2 times per day  ? docusate sodium  100 mg Oral BID  ? feeding supplement  237 mL Oral TID AC & HS  ? FLUoxetine  40 mg Oral Daily  ? folic acid  1 mg Oral Daily  ? levETIRAcetam  500 mg Oral BID  ? mouth rinse  15 mL Mouth Rinse BID  ? melatonin  10 mg Oral QHS  ? nicotine  21 mg Transdermal Daily  ? OLANZapine  10 mg Oral QHS  ? OLANZapine  5 mg Oral Daily  ? polyethylene glycol  17 g Oral Daily  ? risperiDONE  1 mg Oral QHS  ? rivaroxaban  10 mg Oral Daily  ? selenium sulfide   Topical Daily  ? thiamine  250 mg Oral Daily  ? traZODone  100 mg Oral QHS  ? valproic acid  500 mg Oral BID  ? ?Continuous Infusions: ?PRN Meds:.acetaminophen, [DISCONTINUED] OLANZapine zydis **OR** OLANZapine, ondansetron (ZOFRAN) IV  ? ? ?Signature ? ?Huey Bienenstock M.D on 03/28/2021 at 10:54 AM   -  To page go to [email or web address]  ? ?  ? ? ?

## 2021-03-29 DIAGNOSIS — K5900 Constipation, unspecified: Secondary | ICD-10-CM

## 2021-03-29 MED ORDER — MAGNESIUM HYDROXIDE 400 MG/5ML PO SUSP
30.0000 mL | Freq: Once | ORAL | Status: AC
  Administered 2021-03-29: 30 mL via ORAL
  Filled 2021-03-29: qty 30

## 2021-03-29 MED ORDER — HYDROCORTISONE 1 % EX CREA
TOPICAL_CREAM | Freq: Four times a day (QID) | CUTANEOUS | Status: DC
  Administered 2021-03-30 – 2021-04-02 (×3): 1 via TOPICAL
  Filled 2021-03-29: qty 28

## 2021-03-29 NOTE — Progress Notes (Signed)
Progress Note   Patient: Steve [name] ZOX:[id number] DOB: Jun 07, 1965 DOA: 01/05/2021     83 DOS: the patient was seen and examined on 03/29/2021   Brief hospital course: Tobias Avitabile is a 56 y.o. male with history of major depression, anxiety who was in a residential ETOH treatment center for last 5 months. He was brought to the ED on 12/20 from a residential ETOH treatment center for convalescence, combativeness and altered sensorium.  Per collateral history, patient was not acting typical for last few days.  In the ED, patient was confused, combative. Labs showed AKI with creatinine of 3.27, rhabdomyolysis, abnormal LFTs, elevated WBC count at 23.7 and lactic acid level at 7.6. 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 Blood culture was sent, empiric antibiotic started.  IV fluid resuscitation started and patient was initially admitted to ICU. 12/21, underwent LP which showed staph in CSF, felt to be contaminant.  EEG did not show any seizures 12/24, HD catheter was placed but patient did not require HD as creatinine started to improve 12/31, with clinical improvement, patient was transferred out of ICU to [facility]. 1/18 Cognitive evaluations: 2 on the SLUMS, a 9 on the MMSE, and a 0 on medi-cog and short blessed test  Assessment and Plan: Left  LE pain Recurrent XR neg Plan is to use simple ankle support for ambulation  * Cognitive impairment  2/2 Wernicke's encephalopathy and alcoholic dementia Initial MRI revealed tiny acute infarct on the right but not significant enough to explain persistent cognitive and behavioral abnormalities. Also evidence of prior lacunar infarct right basal ganglia Cognitive screening revealed significant cognitive deficits Continue Prozac, Depakote, Klonopin and Zyprexa  Cont HS Melatonin Over the WE (3/4-3/5) had worsened nocturnal behaviors  prompting re imvolvement of psych team- meds adjusted Continue sitter -cont posey belt when sitter not available. Depakote decreased to 500 mg BID, Zyprexa adjusted to 5 mg 2x daily during daytime hours, trazadone increased to 100 mg HS and HS dose of Klonopin dc'd. As of 3/10 psych felt patient was doing better with Risperdal as opposed to Zyprexa.  Updated on behavioral changes overnight that involved patient striking out at nurses with his arm.  Consideration being given to adjusting Risperdal and tapering and deseeding Zyprexa.  Of note patient has no recollection of last night's events or behavioral manifestations. Overnight into 3/8 patient's delusional thought processes worsened.  During the day he stated he was going back to work "next week".  Overnight he became extremely agitating stating he had to get to work and when staff attempted to keep him in the bed he began hitting at the staff.  He is now restrained at wrists in addition to Posey belt. Theyare considering going up on his Depakote and are also planning to titrate down on Zyprexa and go up on Risperdal while continuing to allow Zyprexa PRN for agitation.  LFTs back up; QTC stable at 464 ms (3/6); platelets are normal w/o thrombocytopneic changes; VPA is subtherapeutic at 37 with a normal albumin-3/8 we will repeat LFTs and EKG Continue thiamine-previously had been given high-dose IV thiamine earlier in the hospitalization with some improvement in cognition     Witnessed seizure ([name]) No apparent prior history of seizure disorder noting patient was not on AEDs prior to admission Unclear if withdrawal seizure or related to other substances 3/10 given recent behavioral changes attending felt EEG needed to be  completed to rule out atypical seizure activity causing behavioral manifestations.  EEG unremarkable other than for moderate diffuse encephalopathy.  No seizure activity. Continue Keppra at recommendation of neurology  Acute  kidney injury with rhabdomyolysis-resolved as of 03/17/2021 Resolved Creatinine peaked at 7.48 on 12/26.  Nephrology consulted with initial plans to initiate dialysis including placement of [facility] but renal function gradually improved and dialysis not needed and TDC removed  Eczema Extremity lesions have resolved but has new areas on chest abdomen flank and back.  Will reorder cortisone to be applied to these areas   2/27   2/27   3/5     Constipation Patient has not had bowel movement since 3/9 according to the flow record Currently he is on scheduled Colace twice daily and daily MiraLAX Will give a dose of milk of magnesia today 3/13  Aspiration pneumonia ([name])-resolved as of 03/04/2021 Completed a course of Zosyn  Possible DVT (deep venous thrombosis) ([name])-resolved as of 03/17/2021 Per critical care note, hemodialysis cath had a clot on it when it was removed.  UE duplex  12/31: no DVT.    Right ventricular dysfunction Echo 1/2 with EF 60 to 65%.  Cannot rule out a small PFO this finding determined to be clinically insignificant given the patient was asymptomatic  Elevated transaminases at time of presentation-resolved as of 03/04/2021 Likely secondary to sepsis like physiology at presentation His AST and ALT were significantly elevated to over 700s on admission.  Re emergence mild elevation LFTs-likely due to Zyprexa and Depakote Due to oversedation Zyprexa dose decreased LFTs have normalized with decrease in Zyprexa dose. 2/16 valproic acid level 55   Physical deconditioning 2/2 ambulatory dysfunction Influenced by patient's poor cognition as well as prior heavy alcohol abuse and malnutrition before admission Therapy continues to recommend SNF placement due to level of assist required with ADLs and IADLs as well as ongoing poor cognition Mobility continues to be influenced not only by cognition but by ambulatory dysfunction/gait and imbalance issues Current goal is to  improve mobility to the point where patient can get up independently without being at risk for falls therefore Posey belt restraint can be discontinued  A physical therapy consult is indicated based on the patients mobility assessment.   Mobility Assessment (last 72 hours)     Mobility Assessment     Row Name 03/15/21 1700 03/15/21 1200 03/14/21 1200 03/14/21 0952 03/14/21 0830   Does patient have an order for bedrest or is patient medically unstable -- No - Continue assessment -- No - Continue assessment No - Continue assessment   What is the highest level of mobility based on the progressive mobility assessment? Level 4 (Walks with assist in room) - Balance while marching in place and cannot step forward and back - Complete Level 4 (Walks with assist in room) - Balance while marching in place and cannot step forward and back - Complete -- Level 4 (Walks with assist in room) - Balance while marching in place and cannot step forward and back - Complete Level 4 (Walks with assist in room) - Balance while marching in place and cannot step forward and back - Complete   Is the above level different from baseline mobility prior to current illness? -- Yes - Recommend PT order -- Yes - Recommend PT order Yes - Recommend PT order    Row Name 03/13/21 2034 03/13/21 0800         Does patient have an order for bedrest or is patient medically  unstable No - Continue assessment No - Continue assessment      What is the highest level of mobility based on the progressive mobility assessment? Level 4 (Walks with assist in room) - Balance while marching in place and cannot step forward and back - Complete Level 2 (Chairfast) - Balance while sitting on edge of bed and cannot stand      Is the above level different from baseline mobility prior to current illness? Yes - Recommend PT order Yes - Recommend PT order                 Severe sepsis without septic shock ([name])-resolved as of 03/17/2021 Ruled  out Sepsis-like physiology secondary to presentation with profound dehydration, hypoperfusion from dehydration related hypotension seizure activity Chest work-up was negative  Skin abnormalities/perineal MASD Wound / Incision (Open or Dehisced) 03/09/21 (MASD) Moisture Associated Skin Damage Groin Right redness under scrotum and to the right Groin (Active)  Date First Assessed/Time First Assessed: 03/09/21 0500   Wound Type: (MASD) Moisture Associated Skin Damage  Location: Groin  Location Orientation: Right  Wound Description (Comments): redness under scrotum and to the right Groin  Present on Admission...    Assessments 03/09/2021  5:00 AM 03/18/2021  8:19 PM  Dressing Type None;Other (Comment) None  Dressing Changed Changed --  Dressing Status Clean, Dry, Intact --  Site / Wound Assessment Clean;Pink;Painful --  Peri-wound Assessment Intact --  Margins Attached edges (approximated) --  Non-staged Wound Description Not applicable --  Treatment Cleansed;Other (Comment) --     No Linked orders to display    Protein calorie malnutrition ([name]) Patient having difficulty eating independently and completing meal We will have OT evaluate to determine if adaptive devices indicated Appears to have some visual disturbance as well contributing Increase protein shakes from twice daily to AC/HS        Subjective:  Received patient in bed.  He is completely naked with Posey belt around the waist.  Patient scratching at eczema lesions on left lateral abdomen.  I asked him if he was seeing his brother anymore in the room and reminded him that he was carrying on a full conversation with his brother who was not in the room yesterday.  Patient seemed perplexed by this information.  The room he told me to have a good weekend.  Physical Exam: Vitals:   03/28/21 2024 03/29/21 0009 03/29/21 0349 03/29/21 0800  BP: 93/67 93/68 97/69  114/74  Pulse: 75 79 72 79  Resp: 15 16 16 17   Temp: 98.2 F (36.8  C) 98.7 F (37.1 C) 97.7 F (36.5 C) 97.8 F (36.6 C)  TempSrc: Oral Oral Oral Oral  SpO2: 98% 99% 100% 99%  Weight:      Height:       Constitutional: NAD, calm , Sitter at bedside. Respiratory: Anterior lung sounds clear, Normal respiratory effort. RA Cardiovascular: Regular pulse, no peripheral edema.  Normotensive.  S1-S2 Abdomen: no tenderness, no masses palpated. Bowel sounds positive. LBM 3/9 Neurologic: CN 2-12 grossly intact. Sensation intact, Strength 3+-4/5 x all 4 extremities.  Skin: Previous eczematous lesions on extremities have healed after application of cortisone cream.  Has additional areas on trunk and back. Psychiatric: Alert and oriented to self only.  Pleasant affect.  Delusional and remains fixated on the need to attend to work and present to the work setting.  Remains impulsive and requires Posey belt restraint.  Data Reviewed: There are no new results to review at this time.  Family Communication:  Patient only  Disposition: Status is: Inpatient Remains inpatient appropriate because:  Unsafe discharge plan secondary to severe cognitive impairment requiring SNF placement.  Currently lacks funding.      DVT Prophylaxis  .Rivaroxaban (xarelto) tablet 10 mg , Rivaroxaban (xarelto) tablet 10 mg   Planned Discharge Destination:  Skilled nursing facility  Medically stable: No-still titrating behavioral meds  COVID vaccination status:  Unknown  Consultants: Nephrology Psychiatry    Procedures: EEG Echocardiogram TDC   Antibiotics: Unasyn x1 dose, cefepime x1 dose, ceftriaxone x1 dose, vancomycin x2 doses     Time spent: 15 minutes  Author: Junious Silk, NP 03/29/2021 12:05 PM  For on call review [email or web address].

## 2021-03-29 NOTE — Progress Notes (Signed)
Occupational Therapy Treatment Patient Details Name: Steve [name] MRN: [id number] DOB: Oct 12, 1965 Today's Date: 03/29/2021   History of present illness Pt is a 56 y.o. male admitted from group home for convalescence, combativeness and altered sensorium on 01/05/21 with witnessed seizure, AMS. 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 EEG unremarkable. LP on 12/21 showed staph in CSF, felt to be contaminant. Workup for AKI, rhabdomyolysis. Course complicated by c/o L foot pain; imaging negative for acute injury. Pt remains hospitalized due to difficult to place. Pt with psych consult who felt no need for inpt psych. No PMH in chart.   OT comments  Pt progressing towards goals this session. Goals updated this session as well to reflect slowly improving overall functionality - today focused on toilet transfer, walk in shower transfer, shower utilizing shower chair. Pt cognition still impacting attention to task and safety. Pt perseverating on smoking a cigarette throughout session. Pt continues to present as a very high fall risk. Pt still requires SNF placement post-acute to maximize safety and independence in ADL and functional transfers.    Recommendations for follow up therapy are one component of a multi-disciplinary discharge planning process, led by the attending physician.  Recommendations may be updated based on patient status, additional functional criteria and insurance authorization.    Follow Up Recommendations  Skilled nursing-short term rehab (<3 hours/day)    Assistance Recommended at Discharge Frequent or constant Supervision/Assistance  Patient can return home with the following  A little help with walking and/or transfers;A little help with bathing/dressing/bathroom;Assistance with cooking/housework;Assist for transportation;Help with stairs or ramp for  entrance   Equipment Recommendations  None recommended by OT    Recommendations for Other Services      Precautions / Restrictions Precautions Precautions: Fall Precaution Comments: posey belt Restrictions Weight Bearing Restrictions: No       Mobility Bed Mobility Overal bed mobility: Needs Assistance Bed Mobility: Supine to Sit, Sit to Supine     Supine to sit: Min guard, HOB elevated Sit to supine: Supervision   General bed mobility comments: Pt strong and very fast, and VERY unsafe tries to get OOB all the time    Transfers Overall transfer level: Needs assistance Equipment used: None, Rolling walker (2 wheels) Transfers: Sit to/from Stand Sit to Stand: Min assist           General transfer comment: Assist for safety, therapist has to weight down the RW as Pt pulls up on it     Balance Overall balance assessment: Needs assistance Sitting-balance support: Feet supported, No upper extremity supported Sitting balance-Leahy Scale: Good Sitting balance - Comments: able to sit on EOB without assitance   Standing balance support: No upper extremity supported, During functional activity, Single extremity supported Standing balance-Leahy Scale: Fair Standing balance comment: dynamic standing requires at least SUE support                           ADL either performed or assessed with clinical judgement   ADL           Upper Body Bathing: Set up;Sitting Upper Body Bathing Details (indicate cue type and reason): performed in shower Lower Body Bathing: Supervison/ safety;Sitting/lateral leans Lower Body Bathing Details (indicate cue type and reason): in shower Upper Body Dressing : Minimal assistance;Sitting Upper Body Dressing Details (indicate cue type and reason): to don  gown   Lower Body Dressing Details (indicate cue type and reason): declined socks Toilet Transfer: Moderate assistance;Ambulation Toilet Transfer Details (indicate cue type and  reason): attempted to sit to early, mod A to guide hips to land on toilet. multimodial cues Toileting- Clothing Manipulation and Hygiene: Min guard;Sitting/lateral lean Toileting - Clothing Manipulation Details (indicate cue type and reason): close min guard Tub/ Engineer, structural: Moderate assistance;Ambulation;Cueing for sequencing;Cueing for safety Tub/Shower Transfer Details (indicate cue type and reason): performed shower transfer in room Functional mobility during ADLs: Moderate assistance;Rolling walker (2 wheels) General ADL Comments: easily distracted during self care    Extremity/Trunk Assessment              Vision       Perception     Praxis      Cognition Arousal/Alertness: Awake/alert Behavior During Therapy: Impulsive Overall Cognitive Status: Impaired/Different from baseline Area of Impairment: Orientation, Attention, Memory, Safety/judgement, Awareness, Problem solving                 Orientation Level: Disoriented to, Situation, Time Current Attention Level: Sustained Memory: Decreased short-term memory   Safety/Judgement: Decreased awareness of deficits, Decreased awareness of safety Awareness: Emergent Problem Solving: Requires verbal cues, Requires tactile cues General Comments: Pt seeing "ants", talking about seeing his brother, tangential and unrelated stories that did not make sense, very impulsive and very fast moving        Exercises      Shoulder Instructions       General Comments Pt constantly requesting cigarettes from Pt    Pertinent Vitals/ Pain       Pain Assessment Pain Assessment: Faces Faces Pain Scale: Hurts little more Pain Location: L foot Pain Descriptors / Indicators: Grimacing, Guarding Pain Intervention(s): Monitored during session, Repositioned  Home Living                                          Prior Functioning/Environment              Frequency  Min 1X/week        Progress  Toward Goals  OT Goals(current goals can now be found in the care plan section)  Progress towards OT goals: Progressing toward goals  Acute Rehab OT Goals Patient Stated Goal: get showered, and get a cigarette OT Goal Formulation: With patient Time For Goal Achievement: 04/12/21 Potential to Achieve Goals: Fair ADL Goals Pt Will Perform Grooming: with supervision;standing Pt Will Perform Lower Body Bathing: with set-up;sit to/from stand Pt Will Perform Lower Body Dressing: with set-up;sitting/lateral leans Pt Will Transfer to Toilet: with supervision;ambulating Pt Will Perform Toileting - Clothing Manipulation and hygiene: with supervision;sitting/lateral leans Additional ADL Goal #1: Pt will demonstrate increased attention to task, requiring less than 3 vc during a 5 min ADL task  Plan Discharge plan remains appropriate    Co-evaluation                 AM-PAC OT "6 Clicks" Daily Activity     Outcome Measure   Help from another person eating meals?: A Little Help from another person taking care of personal grooming?: A Little Help from another person toileting, which includes using toliet, bedpan, or urinal?: A Little Help from another person bathing (including washing, rinsing, drying)?: A Little Help from another person to put on and taking off regular upper body clothing?: A Little Help from  another person to put on and taking off regular lower body clothing?: A Little 6 Click Score: 18    End of Session Equipment Utilized During Treatment: Gait belt;Rolling walker (2 wheels)  OT Visit Diagnosis: Unsteadiness on feet (R26.81);Muscle weakness (generalized) (M62.81);Low vision, both eyes (H54.2);Ataxia, unspecified (R27.0) Pain - Right/Left: Left Pain - part of body: Leg;Ankle and joints of foot   Activity Tolerance Patient tolerated treatment well   Patient Left in bed;with call bell/phone within reach;with bed alarm set;with restraints reapplied;with nursing/sitter  in room   Nurse Communication Mobility status (full shower completed)        Time: [id number] OT Time Calculation (min): 33 min  Charges: OT General Charges $OT Visit: 1 Visit OT Treatments $Self Care/Home Management : 23-37 mins  Nyoka CowdenLaura H OTR/L Acute Rehabilitation Services Pager: [phone] Office: [phone]   Evern BioLaura J Dinia Joynt 03/29/2021, 12:53 PM

## 2021-03-29 NOTE — Assessment & Plan Note (Addendum)
Patient has not had bowel movement since 3/9 according to the flow record Currently he is on scheduled Colace twice daily and daily MiraLAX Will give a dose of milk of magnesia today 3/13

## 2021-03-29 NOTE — Progress Notes (Cosign Needed)
[name] Followup Face-to-Face Psychiatric Evaluation   Service Date: March 29, 2021 LOS:  LOS: 83 days    Assessment  [name] is a 56 y.o. male admitted medically for 01/05/2021  1:14 PM for full body seizure, AMS and combativeness. He carries the psychiatric diagnoses of MDD and and has a past medical history of Wernicke's encephalopathy and alcoholic dementia .Psychiatry was consulted for agitation by Susa Raring, MD.      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 Information on premorbid diagnoses is sparse; at minimum included severe AUD. Outpatient psychotropic medications include Prozac, Trazodone and gabapentin and historically he has had an unknown response to these medications. Focus of treatment in the hospital has been on controlling behaviors and impulsivity; this is difficult given ongoing confabulations (which will not respond to medications and are not being specifically targeted) and pt's relative inability to retain information (such as his need to stay in bed due to being a fall risk). This will likely become easier as he works with PT and is afforded more mobility.  Please see plan below for detailed recommendations. Current over-arching treatment is downtitration of olanzapine due to poor response at high doses, anticholinergic side effect profile (and outside chance it is contributing to transaminitis) while introducing risperidone (please see daily plan for changes). Patient may not require direct equivalent dose of Risperdal, as Zyprexa's anticholinergic effects could be contributing to presentation and Risperdal is less likely to cause same side effects. Would like to increase depakote if LFTs improve as he seemed to have improved behavior at a higher dose; if there is no improvement early in week of 3/13 will likely need to discontinue this medication.   3/11: Patient's  mild transminitis continues, however he appears to be stable in regards to his behavior and improved sleep on current regimen; however it could be argued that patient's Depakote is subtherapeutic and may not be contributing to his improvement. However, as patient is not requiring PRNs will not adjust regiment at this time. Patient also appears to be having illusions but not hallucinations today, which is an improvement.   Diagnoses:  Active Hospital problems: Principal Problem:   Cognitive impairment  2/2 Wernicke's encephalopathy and alcoholic dementia Active Problems:   Skin abnormalities/perineal MASD   Physical deconditioning 2/2 ambulatory dysfunction   Right ventricular dysfunction   Witnessed seizure ([name])   Protein calorie malnutrition ([name])   Ambulatory dysfunction   Eczema   Pressure injury of skin   Constipation     Plan  ## Safety and Observation Level:  - Based on my clinical evaluation, I estimate the patient to be at moderate risk of self harm in the current setting, but requires redirection to keep himself from unintentional self harm. - At this time, we recommend a close level of observation. This decision is based on my review of the chart including patient's history and current presentation, interview of the patient, mental status examination, and consideration of suicide risk including evaluating suicidal ideation, plan, intent, suicidal or self-harm behaviors, risk factors, and protective factors. This judgment is based on our ability to directly address suicide risk, implement suicide prevention strategies and develop a safety plan while the patient is in the clinical setting. Please contact our team if there is a concern that risk level has changed.     ## Medications:  -- Continue klonopin 0.5mg  BID -- Continue Prozac 40mg  -  Continue Melatonin  QHS - Continue Trazodone  QHS  -- Decrease Zyprexa to  dailyand  QHS -- Continue Depakene  BID -  Continue Risperdal  QHS - May continue Zyprexa  q8h PRN for agitation   ## Medical Decision Making Capacity:  -- Not formally assessed   ## Further Work-up:  -- Per primary  -- Recommend updated EKG, resulted below   -- most recent EKG on 3/8 had QtC of 462 -- Pertinent labwork reviewed earlier this admission includes: Depakote: 37 Most recent labs: CMP (sodium 138), LFT's trending up (3/8( AST 59, ALT 89).  PT-INR WNL 3/4 CBC with low H/H and normalizing MCHC; Magnesium now WNL   ## Disposition:  -- Per primary   ## Behavioral / Environmental:  1: Provide appropriate lighting and clear signage; a clock and calendar should be easily visible to the patient. 2: Continue to provide activities for patient ( coloring, word search, book) 3: Reorient the patient to person, place, time and situation on each encounter.  4: Minimize use of Antipsychotics PRN. Patient benefits from constant observation by a sitter.   ##Legal Status     Thank you for this consult request. Recommendations have been communicated to the primary team.  We will continue to follow at this time.    PGY-2 Bobbye Morton, MD   followup history  Relevant Aspects of Hospital Course:  Admitted on 01/05/2021 for Korsakoff..  Patient Report:  Patient appears comfortable with only a belt restraint eating his breakfast. Patient reports that he feels that he slept better last night and got more hours. Patient reports that his appetite remains stable. Patient recongnizes providers, but continues to refuse to believe that he is [facility]. Patient endorses that he has been told he is [facility] many times, but does not believe this. Patient reports that he has not seen an baby cats or anything else lately. Patient reports that he does think he see's a snake under the chair in his room; however when provider touches, what he see's it is actually a black lever under the chair. Patient denies SI, HI, and AVH. Patient reports  that his mood is "not bad." Collateral information:  No one has been able to reach his son this month.   Psychiatric History:  MDD, anxiety.  Vistaril 50 mg every 4 hours needed for anxiety, Prozac 40 mg daily and trazodone 50 mg at bedtime.    Family psych history: None known per EMR.     Social History:      Tobacco use: Defer Alcohol use: Defer Drug use: Defer   Family History:  The patient's family history is not on file.  Medical History: History reviewed. No pertinent past medical history.  Surgical History: History reviewed. No pertinent surgical history.  Medications:   Current Facility-Administered Medications:    acetaminophen (TYLENOL) tablet 650 mg, 650 mg, Oral, Q4H PRN, Shalhoub, Deno Lunger, MD, 650 mg at 03/28/21 1523   aspirin tablet 325 mg, 325 mg, Oral, Daily, 325 mg at 03/29/21 1000 **OR** [DISCONTINUED] aspirin suppository 300 mg, 300 mg, Rectal, Daily, Rejeana Brock, MD   clonazePAM Scarlette Calico) tablet 0.5 mg, 0.5 mg, Oral, 2 times per day, Susa Raring K, MD, 0.5 mg at 03/29/21 1651   docusate sodium (COLACE) capsule 100 mg, 100 mg, Oral, BID, Charlott Holler, MD, 100 mg at 03/29/21 1000   feeding supplement (ENSURE ENLIVE / ENSURE PLUS) liquid 237 mL, 237 mL, Oral, TID AC & HS, Russella Dar,  NP, 237 mL at 03/29/21 1650   FLUoxetine (PROZAC) capsule 40 mg, 40 mg, Oral, Daily, Dahal, Binaya, MD, 40 mg at 03/29/21 1005   folic acid (FOLVITE) tablet 1 mg, 1 mg, Oral, Daily, Vann, Jessica U, DO, 1 mg at 03/29/21 1005   hydrocortisone cream 1 %, , Topical, QID, Russella Dar, NP, Given at 03/29/21 1655   levETIRAcetam (KEPPRA) 100 MG/ML solution 500 mg, 500 mg, Oral, BID, Adhikari, Amrit, MD, 500 mg at 03/29/21 1003   MEDLINE mouth rinse, 15 mL, Mouth Rinse, BID, Olalere, Adewale A, MD, 15 mL at 03/29/21 1006   melatonin tablet 10 mg, 10 mg, Oral, QHS, Ghimire, Shanker M, MD, 10 mg at 03/28/21 2110   nicotine (NICODERM CQ - dosed in mg/24  hours) patch 21 mg, 21 mg, Transdermal, Daily, Dahal, Binaya, MD, 21 mg at 03/29/21 0959   [DISCONTINUED] OLANZapine zydis (ZYPREXA) disintegrating tablet 2.5 mg, 2.5 mg, Oral, Q8H PRN, 2.5 mg at 03/17/21 0956 **OR** OLANZapine (ZYPREXA) injection 5 mg, 5 mg, Intramuscular, Q8H PRN, Cinderella, Margaret A, 5 mg at 03/27/21 0250   OLANZapine (ZYPREXA) tablet 10 mg, 10 mg, Oral, QHS, Cyris Maalouf B, MD, 10 mg at 03/28/21 2110   OLANZapine (ZYPREXA) tablet 5 mg, 5 mg, Oral, Daily, Andreya Lacks B, MD, 5 mg at 03/29/21 1004   ondansetron (ZOFRAN) injection 4 mg, 4 mg, Intravenous, Q8H PRN, Kamat, Sunil G, MD, 4 mg at 01/15/21 0208   polyethylene glycol (MIRALAX / GLYCOLAX) packet 17 g, 17 g, Oral, Daily, Charlott Holler, MD, 17 g at 03/29/21 1002   risperiDONE (RISPERDAL M-TABS) disintegrating tablet 1 mg, 1 mg, Oral, QHS, Micahel Omlor B, MD, 1 mg at 03/28/21 2111   rivaroxaban (XARELTO) tablet 10 mg, 10 mg, Oral, Daily, Junious Silk L, NP, 10 mg at 03/29/21 1003   selenium sulfide (SELSUN) 1 % shampoo, , Topical, Daily, Russella Dar, NP, Given at 03/28/21 0941   thiamine tablet 250 mg, 250 mg, Oral, Daily, Ghimire, Shanker M, MD, 250 mg at 03/29/21 1000   traZODone (DESYREL) tablet 100 mg, 100 mg, Oral, QHS, Ebon Ketchum B, MD, 100 mg at 03/28/21 2111   valproic acid (DEPAKENE) 250 MG capsule 500 mg, 500 mg, Oral, BID, Leroy Sea, MD, 500 mg at 03/29/21 1003  Allergies: No Known Allergies     Objective  Vital signs:  Temp:  [97.7 F (36.5 C)-98.7 F (37.1 C)] 98.2 F (36.8 C) (03/13 1500) Pulse Rate:  [72-84] 84 (03/13 1500) Resp:  [15-18] 16 (03/13 1500) BP: (93-116)/(67-78) 102/74 (03/13 1500) SpO2:  [98 %-100 %] 100 % (03/13 1500)  Psychiatric Specialty Exam:  Presentation  General Appearance: Appropriate for Environment; Casual (eating breakfast)  Eye Contact:Fair  Speech:Clear and Coherent  Speech Volume:Normal  Handedness:No data recorded  Mood and Affect   Mood:Euthymic  Affect:Appropriate   Thought Process  Thought Processes:Goal Directed (more goal directed than other assessments)  Descriptions of Associations:Circumstantial  Orientation:-- (Oriented to person, month, and year not to place)  Thought Content:Scattered  History of Schizophrenia/Schizoaffective disorder:No data recorded Duration of Psychotic Symptoms:No data recorded Hallucinations:Hallucinations: None (having illusions) Description of Auditory Hallucinations: People raising their voices to him Description of Visual Hallucinations: Denies today  Ideas of Reference:Delusions  Suicidal Thoughts:Suicidal Thoughts: No  Homicidal Thoughts:Homicidal Thoughts: No   Sensorium  Memory:Immediate Poor  Judgment:Impaired  Insight:None   Executive Functions  Concentration:Poor  Attention Span:Poor  Recall:Fair  Fund of Knowledge:Poor  Language:Fair   Psychomotor Activity  Psychomotor  Activity:Psychomotor Activity: Decreased   Assets  Assets:Communication Skills; Resilience   Sleep  Sleep:Sleep: Good    Physical Exam: Physical Exam HENT:     Head: Normocephalic and atraumatic.  Neurological:     Mental Status: He is alert.   Review of Systems  Psychiatric/Behavioral:  Negative for depression, hallucinations and suicidal ideas.   Blood pressure 102/74, pulse 84, temperature 98.2 F (36.8 C), temperature source Oral, resp. rate 16, height  (1.727 m), weight 64.3 kg, SpO2 100 %. Body mass index is 21.55 kg/m.

## 2021-03-30 DIAGNOSIS — E512 Wernicke's encephalopathy: Secondary | ICD-10-CM

## 2021-03-30 MED ORDER — STERILE WATER FOR INJECTION IJ SOLN
INTRAMUSCULAR | Status: AC
  Administered 2021-03-30: 10 mL
  Filled 2021-03-30: qty 10

## 2021-03-30 MED ORDER — LEVETIRACETAM 500 MG PO TABS
500.0000 mg | ORAL_TABLET | Freq: Two times a day (BID) | ORAL | Status: DC
Start: 2021-03-30 — End: 2021-05-14
  Administered 2021-03-30 – 2021-05-14 (×90): 500 mg via ORAL
  Filled 2021-03-30 (×91): qty 1

## 2021-03-30 NOTE — Progress Notes (Signed)
[facility] Followup Face-to-Face Psychiatric Evaluation ? ? ?Service Date: March 30, 2021 ?LOS:  LOS: 84 days  ? ? ?Assessment  ?[name] is a 56 y.o. male admitted medically for 01/05/2021  1:14 PM for full body seizure, AMS and combativeness. He carries the psychiatric diagnoses of MDD and and has a past medical history of Wernicke's encephalopathy and alcoholic dementia .Psychiatry was consulted for agitation by Lala Lund, MD.  ?  ?  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 Information on premorbid diagnoses is sparse; at minimum included severe AUD. Outpatient psychotropic medications include Prozac, Trazodone and gabapentin and historically he has had an unknown response to these medications. Focus of treatment in the hospital has been on controlling behaviors and impulsivity; this is difficult given ongoing confabulations (which will not respond to medications and are not being specifically targeted) and pt's relative inability to retain information (such as his need to stay in bed due to being a fall risk). This will likely become easier as he works with PT and is afforded more mobility.  Please see plan below for detailed recommendations. Current over-arching treatment is downtitration of olanzapine due to poor response at high doses, anticholinergic side effect profile (and outside chance it is contributing to transaminitis) while introducing risperidone (please see daily plan for changes). Patient may not require direct equivalent dose of Risperdal, as Zyprexa's anticholinergic effects could be contributing to presentation and Risperdal is less likely to cause same side effects. Would like to increase depakote if LFTs improve as he seemed to have improved behavior at a higher dose; if there is no improvement early in week of 3/13 will likely need to discontinue this medication.  ? ?3/24: Patient  appears to be doing better, with multiple days without requiring PRN medication and increased level of orientation. This is a significant increase from requiring multiple PRNS in a 24hr period. It is not unlikely that patient may have occasional behavior episodes but the frequency has significantly decreased.  ? ?Diagnoses:  ?Active Hospital problems: ?Principal Problem: ?  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  Skin abnormalities/perineal MASD ?  Physical deconditioning 2/2 ambulatory dysfunction ?  Right ventricular dysfunction ?  Witnessed seizure ([place]) ?  Protein calorie malnutrition ([place]) ?  Ambulatory dysfunction ?  Eczema ?  Pressure injury of skin ?  Constipation ?  ? ? ?Plan  ?## Safety and Observation Level:  ?- Based on my clinical evaluation, I estimate the patient to be at moderate risk of self harm in the current setting, but requires redirection to keep himself from unintentional self harm. ?- At this time, we recommend a close level of observation. This decision is based on my review of the chart including patient's history and current presentation, interview of the patient, mental status examination, and consideration of suicide risk including evaluating suicidal ideation, plan, intent, suicidal or self-harm behaviors, risk factors, and protective factors. This judgment is based on our ability to directly address suicide risk, implement suicide prevention strategies and develop a safety plan while the patient is in the clinical setting. Please contact our team if there is a concern that risk level has changed. ?  ?  ?## Medications:  ?-- Continue klonopin 0.5mg  BID ?-- Continue Prozac 40mg  ?- Continue Melatonin 10mg  QHS ?- Continue Trazodone 100mg  QHS  ?-- Decrease Zyprexa to 5mg  dailyand 10mg  QHS ?-- Continue  Depakene 500mg  BID ?- Continue Risperdal 1mg  QHS ?- May continue Zyprexa 5mg  q8h PRN for agitation ?  ?## Medical Decision Making  Capacity:  ?-- Not formally assessed ?  ?## Further Work-up:  ?-- Per primary ? -- Recommend updated EKG, resulted below ?  ?-- most recent EKG on 3/8 had QtC of 462 ?-- Pertinent labwork reviewed earlier this admission includes: Depakote: 37 ?Most recent labs: CMP (sodium 138), LFT's trending up (3/8( AST 59, ALT 89).  PT-INR WNL ?3/4 CBC with low H/H and normalizing MCHC; Magnesium now WNL ?  ?## Disposition:  ?-- Per primary ?  ?## Behavioral / Environmental:  ?1: Provide appropriate lighting and clear signage; a clock and calendar should be easily visible to the patient. ?2: Continue to provide activities for patient ( coloring, word search, book) ?3: Reorient the patient to person, place, time and situation on each encounter.  ?4: Minimize use of Antipsychotics PRN. Patient benefits from constant observation by a sitter. ?  ?##Legal Status ?  ?  ?Thank you for this consult request. Recommendations have been communicated to the primary team.  We will continue to follow at this time.  ?  ?Pgy-2 ?[name], MD ? ? ?Followup history  ?Relevant Aspects of Hospital Course:  ?Admitted on 01/05/2021 for  Korsakoff. ? ?Patient Report:  ?On assessment today patient reports that he slept well. Patient reports that he continues to have a good appetite and is waiting for breakfast this AM. Patient reports he recognizes provider as a psychiatrist who works for [employer] and is also oriented to year. Patient reports that he believes it is April and also talks about living with a someone who also works in [employer]. Patient endorses having a pleasant mood.  ?Collateral information:  ?No one has been able to reach his son this month. ?  ?Psychiatric History:  ?MDD, anxiety.  ?Vistaril 50 mg every 4 hours needed for anxiety, Prozac 40 mg daily and trazodone 50 mg at bedtime.  ?  ?Family psych history: None known per EMR. ?  ?  ?Social History:  ?  ?  ?Tobacco use: Defer ?Alcohol use: Defer ?Drug use: Defer ?  ? ?Family  History:  ? ?The patient's family history is not on file. ? ?Medical History: ?History reviewed. No pertinent past medical history. ? ?Surgical History: ?History reviewed. No pertinent surgical history. ? ?Medications:  ? ?Current Facility-Administered Medications:  ?  acetaminophen (TYLENOL) tablet 650 mg, 650 mg, Oral, Q4H PRN, Shalhoub, Sherryll Burger, MD, 650 mg at 03/28/21 1523 ?  aspirin tablet 325 mg, 325 mg, Oral, Daily, 325 mg at 03/30/21 0839 **OR** [DISCONTINUED] aspirin suppository 300 mg, 300 mg, Rectal, Daily, Greta Doom, MD ?  clonazePAM Bobbye [place]) tablet 0.5 mg, 0.5 mg, Oral, 2 times per day, Thurnell Lose, MD, 0.5 mg at 03/30/21 [id number] ?  docusate sodium (COLACE) capsule 100 mg, 100 mg, Oral, BID, Spero Geralds, MD, 100 mg at 03/30/21 [id number] ?  feeding supplement (ENSURE ENLIVE / ENSURE PLUS) liquid 237 mL, 237 mL, Oral, TID AC & HS, Samella Parr, NP, 237 mL at 03/30/21 0840 ?  FLUoxetine (PROZAC) capsule 40 mg, 40 mg, Oral, Daily, Dahal, Binaya, MD, 40 mg at 03/30/21 0839 ?  folic acid (FOLVITE) tablet 1 mg, 1 mg, Oral, Daily, Vann, Jessica U, DO, 1 mg at 03/30/21 0839 ?  hydrocortisone cream 1 %, , Topical, QID, Samella Parr, NP, Given at 03/30/21 0840 ?  levETIRAcetam (KEPPRA) 100 MG/ML  solution 500 mg, 500 mg, Oral, BID, Shelly Coss, MD, 500 mg at 03/30/21 [id number] ?  MEDLINE mouth rinse, 15 mL, Mouth Rinse, BID, Olalere, Adewale A, MD, 15 mL at 03/29/21 2136 ?  melatonin tablet 10 mg, 10 mg, Oral, QHS, Ghimire, Shanker M, MD, 10 mg at 03/29/21 2120 ?  nicotine (NICODERM CQ - dosed in mg/24 hours) patch 21 mg, 21 mg, Transdermal, Daily, Dahal, Binaya, MD, 21 mg at 03/30/21 0839 ?  [DISCONTINUED] OLANZapine zydis (ZYPREXA) disintegrating tablet 2.5 mg, 2.5 mg, Oral, Q8H PRN, 2.5 mg at 03/17/21 0956 **OR** OLANZapine (ZYPREXA) injection 5 mg, 5 mg, Intramuscular, Q8H PRN, Cinderella, Margaret A, 5 mg at 03/27/21 0250 ?  OLANZapine (ZYPREXA) tablet 10 mg, 10 mg, Oral, QHS, Kaspar Albornoz,  Nicolet Griffy B, MD, 10 mg at 03/29/21 2120 ?  OLANZapine (ZYPREXA) tablet 5 mg, 5 mg, Oral, Daily, Maanasa Aderhold B, MD, 5 mg at 03/30/21 0839 ?  ondansetron (ZOFRAN) injection 4 mg, 4 mg, Intravenous, Q8H PRN, Kamat, Sun

## 2021-03-30 NOTE — Progress Notes (Signed)
Physical Therapy Treatment ?Patient Details ?Name: Steve [name] ?MRN: [id number] ?DOB: Feb 09, 1965 ?Today's Date: 03/30/2021 ? ? ?History of Present Illness Pt is a 56 y.o. male admitted from group home for convalescence, combativeness and altered sensorium on 01/05/21 with witnessed seizure, AMS. 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 EEG unremarkable. LP on 12/21 showed staph in CSF, felt to be contaminant. Workup for AKI, rhabdomyolysis. Course complicated by c/o L foot pain; imaging negative for acute injury. Pt remains hospitalized due to difficult to place. Pt with psych consult who felt no need for inpt psych. No PMH in chart. ? ?  ?PT Comments  ? ? Pt was seen for therapy session with gait in balance challenge of HHA walk, as well as high level balance challenges.  He is quickly distracted by seeing people and a dog on the roof of the hospital, and is not reassured that there are not people there.  He is quick to step out of the balance postures, esp if he loses his balance will redirect himself to look out the window or to move toward another location.  Follow along with him to get up to longer walking trips and to be more dynamically balanced with sudden directional changes as he is able to focus on the task.   ?Recommendations for follow up therapy are one component of a multi-disciplinary discharge planning process, led by the attending physician.  Recommendations may be updated based on patient status, additional functional criteria and insurance authorization. ? ?Follow Up Recommendations ? Skilled nursing-short term rehab (<3 hours/day) ?  ?  ?Assistance Recommended at Discharge Frequent or constant Supervision/Assistance  ?Patient can return home with the following A little help with walking and/or transfers;A little help with bathing/dressing/bathroom;Assist for  transportation;Help with stairs or ramp for entrance ?  ?Equipment Recommendations ? Rolling walker (2 wheels)  ?  ?Recommendations for Other Services   ? ? ?  ?Precautions / Restrictions Precautions ?Precautions: Fall ?Precaution Comments: has a sitter ?Restrictions ?Weight Bearing Restrictions: No ?LLE Weight Bearing: Weight bearing as tolerated ?Other Position/Activity Restrictions: CAM boot not needed per ortho note  ?  ? ?Mobility ? Bed Mobility ?Overal bed mobility: Needs Assistance ?  ?  ?  ?  ?  ?  ?General bed mobility comments: pt is impulsive and gets up to move quickly, completely unaware of his imbalance ?  ? ?Transfers ?Overall transfer level: Needs assistance ?Equipment used: 1 person hand held assist ?Transfers: Sit to/from Stand ?Sit to Stand: Min guard ?  ?  ?  ?  ?  ?General transfer comment: pt can stand quickly without AD and was impulsively moving toward the window, hallucinations about people and a dog on the roof ?  ? ?Ambulation/Gait ?Ambulation/Gait assistance: Min assist ?Gait Distance (Feet): 250 Feet ?Assistive device: 1 person hand held assist ?Gait Pattern/deviations: Step-through pattern, Decreased stride length, Antalgic ?Gait velocity: reduced, variable ?Gait velocity interpretation: <1.31 ft/sec, indicative of household ambulator ?  ?General Gait Details: assist for avoidance of obstacles and care of his tendency to laterally lose balance ? ? ?Stairs ?  ?  ?  ?  ?  ? ? ?Wheelchair Mobility ?  ? ?Modified Rankin (Stroke Patients Only) ?  ? ? ?  ?Balance Overall balance assessment: Needs assistance ?Sitting-balance support: Feet supported, No upper extremity supported ?Sitting balance-Leahy Scale: Good ?  ?  ?  ?Standing balance-Leahy  Scale: Fair ?Standing balance comment: min guard when pt stands and steps immediately ?  ?  ?  ?  ?  ?  ?  ?  ?  ?  ?  ?  ? ?  ?Cognition Arousal/Alertness: Awake/alert ?Behavior During Therapy: Impulsive ?Overall Cognitive Status: Impaired/Different  from baseline ?Area of Impairment: Orientation, Attention, Memory, Following commands, Safety/judgement, Awareness, Problem solving ?  ?  ?  ?  ?  ?  ?  ?  ?Orientation Level: Time, Situation ?Current Attention Level: Selective ?Memory: Decreased recall of precautions, Decreased short-term memory ?Following Commands: Follows one step commands with increased time, Follows one step commands inconsistently ?Safety/Judgement: Decreased awareness of safety, Decreased awareness of deficits ?Awareness: Emergent, Intellectual ?Problem Solving: Slow processing, Requires verbal cues, Requires tactile cues ?General Comments: seeing his son and a dog outside, looking out at the roof ?  ?  ? ?  ?Exercises Other Exercises ?Other Exercises: high level balance practice ? ?  ?General Comments General comments (skin integrity, edema, etc.): pt is up to walk with help to balance and maneuver, no real complaints on L foot until after walking ?  ?  ? ?Pertinent Vitals/Pain Pain Assessment ?Pain Assessment: Faces ?Faces Pain Scale: Hurts little more ?Breathing: occasional labored breathing, short period of hyperventilation ?Negative Vocalization: occasional moan/groan, low speech, negative/disapproving quality ?Facial Expression: sad, frightened, frown ?Body Language: tense, distressed pacing, fidgeting ?Consolability: distracted or reassured by voice/touch ?PAINAD Score: 5 ?Pain Location: L foot ?Pain Descriptors / Indicators: Grimacing, Guarding ?Pain Intervention(s): Repositioned, Monitored during session, Limited activity within patient's tolerance  ? ? ?Home Living   ?  ?  ?  ?  ?  ?  ?  ?  ?  ?   ?  ?Prior Function    ?  ?  ?   ? ?PT Goals (current goals can now be found in the care plan section) Acute Rehab PT Goals ?Patient Stated Goal: None stated ? ?  ?Frequency ? ? ? Min 2X/week ? ? ? ?  ?PT Plan Current plan remains appropriate  ? ? ?Co-evaluation   ?  ?  ?  ?  ? ?  ?AM-PAC PT "6 Clicks" Mobility   ?Outcome Measure ? Help  needed turning from your back to your side while in a flat bed without using bedrails?: A Little ?Help needed moving from lying on your back to sitting on the side of a flat bed without using bedrails?: A Little ?Help needed moving to and from a bed to a chair (including a wheelchair)?: A Little ?Help needed standing up from a chair using your arms (e.g., wheelchair or bedside chair)?: A Little ?Help needed to walk in hospital room?: A Little ?Help needed climbing 3-5 steps with a railing? : A Little ?6 Click Score: 18 ? ?  ?End of Session Equipment Utilized During Treatment: Gait belt ?Activity Tolerance: Patient tolerated treatment well;Treatment limited secondary to medical complications (Comment) ?Patient left: with call bell/phone within reach;in chair;with nursing/sitter in room ?Nurse Communication: Mobility status ?PT Visit Diagnosis: Other abnormalities of gait and mobility (R26.89);Other symptoms and signs involving the nervous system (R29.898) ?Pain - Right/Left: Left ?Pain - part of body: Leg ?  ? ? ?Time: [id number] ?PT Time Calculation (min) (ACUTE ONLY): 23 min ? ?Charges:  $Gait Training: 8-22 mins ?$Neuromuscular Re-education: 8-22 mins   ?Ramond Dial ?03/30/2021, 12:26 PM ? ?Mee Hives, PT PhD ?Acute Rehab Dept. Number: [facility] [id number] and [place] [phone] ? ? ?

## 2021-03-30 NOTE — Progress Notes (Signed)
Pt begin to display agitation. Pt was walking in hall with sitter CNA and not following commands. Pt placed back in room and wanted to continue getting up and grabbing things in hallway and did push staff . This RN went to assess pt and pt was not directable. ? ?PRN medication administered. ? ?

## 2021-03-30 NOTE — Progress Notes (Signed)
?Progress Note ? ? ?PatientTerrilyn [name]: Steve [name] [id number] DOB: 1965-07-25 DOA: 01/05/2021     84 ?DOS: the patient was seen and examined on 03/30/2021 ?  ?Brief hospital course: ?Steve [name] is a 56 y.o. male with history of major depression, anxiety who was in a residential ETOH treatment center for last 5 months. He was brought to the ED on 12/20 from a residential ETOH treatment center for convalescence, combativeness and altered sensorium.  Per collateral history, patient was not acting typical for last few days. ? ?In the ED, patient was confused, combative. Labs showed AKI with creatinine of 3.27, rhabdomyolysis, abnormal LFTs, elevated WBC count at 23.7 and lactic acid level at 7.6. 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 Blood culture was sent, empiric antibiotic started.  IV fluid resuscitation started and patient was initially admitted to ICU. ?12/21, underwent LP which showed staph in CSF, felt to be contaminant.  EEG did not show any seizures ?12/24, HD catheter was placed but patient did not require HD as creatinine started to improve ?12/31, with clinical improvement, [facility] ?1/18 Cognitive evaluations: 2 on the SLUMS, a 9 on the MMSE, and a 0 on medi-cog and short blessed test ? ?Assessment and Plan: ?Left  LE pain ?Recurrent ?XR neg ?Plan is to use simple ankle support for ambulation ? ?* Cognitive impairment  2/2 Wernicke's encephalopathy and alcoholic dementia ?Initial MRI revealed tiny acute infarct on the right but not significant enough to explain persistent cognitive and behavioral abnormalities. Also evidence of prior lacunar infarct right basal ganglia ?Cognitive screening revealed significant cognitive deficits ?Continue Prozac, Depakote, Klonopin, Zyprexa and HS melatonin ?Continue sitter -cont posey belt when sitter not  available. ?Appreciate psych assistance with dose adjustments for psychotropic medications.  Psych notes they would like to increase the Depakote dosage pending LFTs.  Is important to note that Depakote level is subtherapeutic and likely not the reason for the elevated LFTs and this is likely from the Zyprexa which has been recently decreased.  Unfortunately they do remain elevated.  Recent QTc unremarkable. ?Continue thiamine-previously had been given high-dose IV thiamine earlier in the hospitalization with some improvement in cognition ? ? ? ? ?Witnessed seizure ([name]) ?No apparent prior history of seizure disorder noting patient was not on AEDs prior to admission ?Unclear if withdrawal seizure or related to other substances ?3/10 given recent behavioral changes attending felt EEG needed to be completed to rule out atypical seizure activity causing behavioral manifestations.  EEG unremarkable other than for moderate diffuse encephalopathy.  No seizure activity. ?Continue Keppra at recommendation of neurology ? ?Acute kidney injury with rhabdomyolysis-resolved as of 03/17/2021 ?Resolved ?Creatinine peaked at 7.48 on 12/26.  Nephrology consulted with [facility] but [facility] function gradually improved and dialysis not needed and TDC removed ? ?Eczema ?Extremity lesions have resolved but has new areas on chest abdomen flank and back.  Will reorder cortisone to be applied to these areas ?  ?2/27 ? ? ?2/27 ? ? ?3/5 ? ? ? ? ?Constipation ?Patient has not had bowel movement since 3/9 according to the flow record ?Currently he is on scheduled Colace twice daily and daily MiraLAX ?Will give a dose of milk of magnesia today 3/13 ? ?Aspiration pneumonia ([name])-resolved as of 03/04/2021 ?Completed a course of Zosyn ? ?Possible DVT (deep venous thrombosis) ([name])-resolved  as of 03/17/2021 ?Per critical care note, hemodialysis cath had a clot on it when it was removed.  UE duplex  12/31:  no DVT.   ? ?Right ventricular dysfunction ?Echo 1/2 with EF 60 to 65%.  Cannot rule out a small PFO this finding determined to be clinically insignificant given the patient was asymptomatic ? ?Elevated transaminases at time of presentation-resolved as of 03/04/2021 ?Likely secondary to sepsis like physiology at presentation ?His AST and ALT were significantly elevated to over 700s on admission.  ?Re emergence mild elevation LFTs-likely due to Zyprexa and Depakote ?Due to oversedation Zyprexa dose decreased ?LFTs have normalized with decrease in Zyprexa dose. ?2/16 valproic acid level 55 ? ? ?Physical deconditioning 2/2 ambulatory dysfunction ?Influenced by patient's poor cognition as well as prior heavy alcohol abuse and malnutrition before admission ?Therapy continues to recommend SNF placement due to level of assist required with ADLs and IADLs as well as ongoing poor cognition ?Mobility continues to be influenced not only by cognition but by ambulatory dysfunction/gait and imbalance issues ?Current goal is to improve mobility to the point where patient can get up independently without being at risk for falls therefore Posey belt restraint can be discontinued ? ?A physical therapy consult is indicated based on the patient?s mobility assessment. ? ? ?Mobility Assessment (last 72 hours)   ? ? Mobility Assessment   ? ? Row Name 03/15/21 1700 03/15/21 1200 03/14/21 1200 03/14/21 0952 03/14/21 0830  ? Does patient have an order for bedrest or is patient medically unstable -- No - Continue assessment -- No - Continue assessment No - Continue assessment  ? What is the highest level of mobility based on the progressive mobility assessment? Level 4 (Walks with assist in room) - Balance while marching in place and cannot step forward and back - Complete Level 4 (Walks with assist in room) - Balance while marching in place and cannot step forward and back - Complete -- Level 4 (Walks with assist in room) - Balance while  marching in place and cannot step forward and back - Complete Level 4 (Walks with assist in room) - Balance while marching in place and cannot step forward and back - Complete  ? Is the above level different from baseline mobility prior to current illness? -- Yes - Recommend PT order -- Yes - Recommend PT order Yes - Recommend PT order  ? ? Row Name 03/13/21 2034 03/13/21 0800  ?  ?  ?  ? Does patient have an order for bedrest or is patient medically unstable No - Continue assessment No - Continue assessment     ? What is the highest level of mobility based on the progressive mobility assessment? Level 4 (Walks with assist in room) - Balance while marching in place and cannot step forward and back - Complete Level 2 (Chairfast) - Balance while sitting on edge of bed and cannot stand     ? Is the above level different from baseline mobility prior to current illness? Yes - Recommend PT order Yes - Recommend PT order     ? ?  ?  ? ?  ? ? ? ? ?Severe sepsis without septic shock ([name])-resolved as of 03/17/2021 ?Ruled out ?Sepsis-like physiology secondary to presentation with profound dehydration, hypoperfusion from dehydration related hypotension seizure activity ?Chest work-up was negative ? ?Skin abnormalities/perineal MASD ?Wound / Incision (Open or Dehisced) 03/09/21 (MASD) Moisture Associated Skin Damage Groin Right redness under scrotum and to the right Groin (Active)  ?  Date First Assessed/Time First Assessed: 03/09/21 0500   Wound Type: (MASD) Moisture Associated Skin Damage  Location: Groin  Location Orientation: Right  Wound Description (Comments): redness under scrotum and to the right Groin  Present on Admission...  ?  ?Assessments 03/09/2021  5:00 AM 03/18/2021  8:19 PM  ?Dressing Type None;Other (Comment) None  ?Dressing Changed Changed --  ?Dressing Status Clean, Dry, Intact --  ?Site / Wound Assessment Clean;Pink;Painful --  ?Peri-wound Assessment Intact --  ?Margins Attached edges (approximated) --   ?Non-staged Wound Description Not applicable --  ?Treatment Cleansed;Other (Comment) --  ?   ?No Linked orders to display  ? ? ?Protein calorie malnutrition ([name]) ?Patient having difficulty eating independently and completin

## 2021-03-30 NOTE — Progress Notes (Addendum)
2:15pm: ?CSW met with DSS social worker Talbert Forest at bedside to discuss patient. CSW provided social worker Jimmye Norman with a copy of patient's H&P for reference.  ? ?CSW sent patient's clinicals out to additional facilities for review. ? ?7:50am: ?CSW received e-mail from [place] who states the patient has been approved for LTC Medicaid. The assigned case worker is Ms. Leory Plowman at [phone]. ? ?CSW attempted to reach Ms. Leory Plowman via phone without success - a voicemail was left requesting a return call. ? ?Madilyn Fireman, MSW, LCSW ?Transitions of Care  Clinical Social Worker II ?[phone] ? ?

## 2021-03-31 DIAGNOSIS — E512 Wernicke's encephalopathy: Secondary | ICD-10-CM | POA: Diagnosis not present

## 2021-03-31 MED ORDER — STERILE WATER FOR INJECTION IJ SOLN
INTRAMUSCULAR | Status: AC
  Filled 2021-03-31: qty 10

## 2021-03-31 NOTE — Progress Notes (Addendum)
2pm: ?Patient was denied admission at [facility]. ? ?10:50am: ?CSW spoke with Georgeann Oppenheim at [facility] to request she review patient's clinicals. Georgeann Oppenheim states she will discuss patient with DON and will return call to CSW. ? ?CSW spoke with Melissa at [facility] who states patient's clinicals are under review by the DON at this time. ? ?Edwin Dada, MSW, LCSW ?Transitions of Care  Clinical Social Worker II ?[phone] ? ?

## 2021-03-31 NOTE — Progress Notes (Signed)
Speech Language Pathology Treatment: Cognitive-Linquistic  ?Patient Details ?Name: Steve [name] ?MRN: [id number] ?DOB: 04-03-1965 ?Today's Date: 03/31/2021 ?Time: 1017-5102 ?SLP Time Calculation (min) (ACUTE ONLY): 22 min ? ?Assessment / Plan / Recommendation ?Clinical Impression ? Pt was seen for cognitive-linguistic treatment. He was alert and cooperative. Pt reported recalling working with SLP, but based on his description, it was evident that the pt did not recall working with this SLP. Pt demonstrated 67% accuracy with 3-task recall increasing to 100% with cues. He achieved 0% accuracy with a working memory task increasing to 100% with frequent verbal prompts and repetition. He completed simple money calculations with 75% accuracy increasing to 100% with cues. He demonstrated 25% accuracy with money problems increasing to 100% with verbal prompts. He completed the app-based attention task with 53% accuracy given verbal prompts to attend to the salient details. He exhibited an average response time of 18.36 seconds with a range of 4.24-29.65 seconds. It appears that pt's accuracy was compromised by his notably-increased speed during this session. SLP will continue to follow pt.  ?  ?HPI HPI: Pt is a 56 y.o. male admitted from group home (substance abuse rehab?) on 01/05/21 with witnessed seizure, AMS. 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 EEG unremarkable. LP on 12/21 showed staph in CSF, felt to be contaminant. Workup for AKI, rhabdomyolysis. Dx Uremic encephalopathy. Course complicated by c/o L foot pain; imaging negative for acute injury. Pt remains hospitalized due to difficulty with placement. SLP evaluation on 01/26/21: 9/27 on Mini Mental State Exam, SLUMS 02/03/21: 2/30; SLUMS 1/27: 1/30. Psych consulted and as of 1/16, pt did not meet criteria for inpatient psychiatric admission. ?   ?   ?SLP Plan ? Continue with current plan of care ? ?  ?  ?Recommendations for follow up therapy are one component of a multi-disciplinary discharge planning process, led by the attending physician.  Recommendations may be updated based on patient status, additional functional criteria and insurance authorization. ?  ? ?Recommendations  ?   ?   ?    ?   ? ? ? ? Oral Care Recommendations: Oral care BID ?Follow Up Recommendations: Skilled nursing-short term rehab (<3 hours/day) ?Assistance recommended at discharge: Frequent or constant Supervision/Assistance ?SLP Visit Diagnosis: Cognitive communication deficit (R41.841);Attention and concentration deficit ?Attention and concentration deficit following: Other cerebrovascular disease ?Plan: Continue with current plan of care ? ? ? ? ?  ?Levoy Geisen I. [name], MS, CCC-SLP ?Acute Rehabilitation Services ?Office number [phone] ?Pager [phone] ? ? ? ?[name] ? ?03/31/2021, 1:13 PM ? ?  ?

## 2021-03-31 NOTE — Progress Notes (Signed)
?PROGRESS NOTE ? ? ? ?Steve [name]  [id number] DOB: 03-22-1965 DOA: 01/05/2021 ?PCP: No primary care provider on file.  ? ? ?Brief Narrative:  ?56 year old gentleman with history of major depression anxiety and alcohol use disorder who was brought to emergency room from residential alcohol treatment center where he was staying for 5 months for combativeness and altered sensorium.  He remains in the hospital since then with his behavioral issues.  Initial presentation to the emergency room, he was confused and combative.  Had evidence of acute kidney injury with creatinine 3.27, WBC count 23.7 and lactic acid 7.6.  CT head was negative.  Infection work-up was negative.  Initially plan for hemodialysis, however did not require dialysis.  He was ultimately stabilized and transferred to medical floor.  He remains in the hospital with not able to find placement.  Actively followed by psychiatry. ? ? ?Assessment & Plan: ?  ?Acute renal failure, resolved.  Normalized. ?Severe sepsis present on admission, resolved.  Normalized. ? 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 ?Initial MRI revealed tiny acute infarct on the right but not significant enough to explain persistent cognitive and behavioral abnormalities. ?Patient on multiple antipsychotic medications and actively followed by neurology.  Currently remains on ?Klonopin 0.5 mg twice daily, fluoxetine 40 mg daily, Keppra 500 mg twice daily, melatonin 10 mg at night, olanzapine 5 mg in the morning, 10 mg at night, risperidone 1 mg at night, Depakote 500 mg twice daily, trazodone 100 mg at night.  Remains on thiamine and folic acid. ?Continue all-time fall precautions.  Continue safety precautions, observation. ? ?Possible DVT: ?Patient is on Xarelto.  No documented DVT.  Critical care indicated that when they removed hemodialysis catheter he had a clot on it.  Remains on Xarelto since  then. ?Catheter associated blood clot, catheter removed.   ?Patient does not need full dose anticoagulation, will keep on DVT prophylaxis dose of Xarelto. ? ? ?DVT prophylaxis: rivaroxaban (XARELTO) tablet 10 mg Start: 02/27/21 1000 ?rivaroxaban (XARELTO) tablet 10 mg  ? ?Code Status: Full code ?Family Communication: None ?Disposition Plan: Status is: Inpatient ? ?Remains inpatient appropriate because: No safe disposition plans. ? ? ?  ? ? ?Consultants:  ?Multiple.  Currently actively followed by psychiatry. ? ?Procedures:  ?Multiple procedures.  Currently none. ? ?Antimicrobials:  ?Completed oral antibiotics. ? ? ?Subjective: ?Patient seen and examined.  He was trying to take a nap after breakfast.  Sitter at the bedside reported patient acting well, ate and slept.  Slightly confused but not agitated. ? ?Objective: ?Vitals:  ? 03/30/21 1946 03/30/21 2319 03/31/21 0430 03/31/21 [id number]  ?BP: 102/76 109/84 105/80 106/72  ?Pulse: 97 87 80 79  ?Resp: 18 18 20 19   ?Temp: 97.6 ?F (36.4 ?C) 98.2 ?F (36.8 ?C) (!) 97.5 ?F (36.4 ?C) 97.6 ?F (36.4 ?C)  ?TempSrc: Oral  Oral Oral  ?SpO2: 99% 99% 99% 100%  ?Weight:      ?Height:      ? ? ?Intake/Output Summary (Last 24 hours) at 03/31/2021 1127 ?Last data filed at 03/30/2021 1700 ?Gross per 24 hour  ?Intake 540 ml  ?Output --  ?Net 540 ml  ? ?Filed Weights  ? 02/27/21 0501 03/05/21 0500 03/06/21 0500  ?Weight: 60.7 kg 57.9 kg 64.3 kg  ? ? ?Examination: ? ?Laying in bed.  On room air.  Looks comfortable.  Confused and confabulating on repeated interview. ?Chest: Bilateral clear ?Abdomen: Soft and nontender. ?Extremities: No cyanosis or clubbing.  No deformities. ? ? ? ?Data Reviewed: I have personally reviewed following labs and imaging studies ? ?CBC: ?Recent Labs  ?Lab 03/27/21 ?0148  ?WBC 4.8  ?NEUTROABS 1.6*  ?HGB 11.0*  ?HCT 32.4*  ?MCV 96.4  ?PLT 257  ? ?Basic Metabolic Panel: ?Recent Labs  ?Lab 03/27/21 ?0148  ?NA 136  ?K 3.9  ?CL 103  ?CO2 27  ?GLUCOSE 105*  ?BUN 20   ?CREATININE 0.61  ?CALCIUM 9.1  ? ?GFR: ?Estimated Creatinine Clearance: 94.9 mL/min (by C-G formula based on SCr of 0.61 mg/dL). ?Liver Function Tests: ?Recent Labs  ?Lab 03/24/21 ?1133 03/27/21 ?0148  ?AST 59* 60*  ?ALT 89* 97*  ?ALKPHOS 40 39  ?BILITOT 0.2* 0.3  ?PROT 6.9 6.3*  ?ALBUMIN 3.5 3.2*  ? ?No results for input(s): LIPASE, AMYLASE in the last 168 hours. ?No results for input(s): AMMONIA in the last 168 hours. ?Coagulation Profile: ?No results for input(s): INR, PROTIME in the last 168 hours. ?Cardiac Enzymes: ?No results for input(s): CKTOTAL, CKMB, CKMBINDEX, TROPONINI in the last 168 hours. ?BNP (last 3 results) ?No results for input(s): PROBNP in the last 8760 hours. ?HbA1C: ?No results for input(s): HGBA1C in the last 72 hours. ?CBG: ?No results for input(s): GLUCAP in the last 168 hours. ?Lipid Profile: ?No results for input(s): CHOL, HDL, LDLCALC, TRIG, CHOLHDL, LDLDIRECT in the last 72 hours. ?Thyroid Function Tests: ?No results for input(s): TSH, T4TOTAL, FREET4, T3FREE, THYROIDAB in the last 72 hours. ?Anemia Panel: ?No results for input(s): VITAMINB12, FOLATE, FERRITIN, TIBC, IRON, RETICCTPCT in the last 72 hours. ?Sepsis Labs: ?No results for input(s): PROCALCITON, LATICACIDVEN in the last 168 hours. ? ?No results found for this or any previous visit (from the past 240 hour(s)).  ? ? ? ? ? ?Radiology Studies: ?No results found. ? ? ? ? ? ?Scheduled Meds: ? aspirin  325 mg Oral Daily  ? clonazePAM  0.5 mg Oral 2 times per day  ? docusate sodium  100 mg Oral BID  ? feeding supplement  237 mL Oral TID AC & HS  ? FLUoxetine  40 mg Oral Daily  ? folic acid  1 mg Oral Daily  ? hydrocortisone cream   Topical QID  ? levETIRAcetam  500 mg Oral BID  ? mouth rinse  15 mL Mouth Rinse BID  ? melatonin  10 mg Oral QHS  ? nicotine  21 mg Transdermal Daily  ? OLANZapine  10 mg Oral QHS  ? OLANZapine  5 mg Oral Daily  ? polyethylene glycol  17 g Oral Daily  ? risperiDONE  1 mg Oral QHS  ? rivaroxaban  10 mg  Oral Daily  ? selenium sulfide   Topical Daily  ? thiamine  250 mg Oral Daily  ? traZODone  100 mg Oral QHS  ? valproic acid  500 mg Oral BID  ? ?Continuous Infusions: ? ? LOS: 85 days  ? ? ?Time spent: 25 minutes ? ? ? ?Barb Merino, MD ?[facility] ?Pager [phone]  ?

## 2021-04-01 DIAGNOSIS — E512 Wernicke's encephalopathy: Secondary | ICD-10-CM | POA: Diagnosis not present

## 2021-04-01 MED ORDER — STERILE WATER FOR INJECTION IJ SOLN
INTRAMUSCULAR | Status: AC
  Filled 2021-04-01: qty 10

## 2021-04-01 NOTE — Consult Note (Addendum)
[facility] Followup Face-to-Face Psychiatric Evaluation ? ? ?Service Date: April 01, 2021 ?LOS:  LOS: 86 days  ? ? ?Assessment  ?Steve [name] is a 56 y.o. male admitted medically for 01/05/2021  1:14 PM for full body seizure, AMS and combativeness. He carries the psychiatric diagnoses of MDD and and has a past medical history of Wernicke's encephalopathy and alcoholic dementia .Psychiatry was consulted for agitation by Lala Lund, MD.  ?  ?  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 Information on premorbid diagnoses is sparse; at minimum included severe AUD. Outpatient psychotropic medications include Prozac, Trazodone and gabapentin and historically he has had an unknown response to these medications. Focus of treatment in the hospital has been on controlling behaviors and impulsivity; this is difficult given ongoing confabulations (which will not respond to medications and are not being specifically targeted) and pt's relative inability to retain information (such as his need to stay in bed due to being a fall risk). This will likely become easier as he works with PT and is afforded more mobility.  Please see plan below for detailed recommendations. Current over-arching treatment is downtitration of olanzapine due to poor response at high doses, anticholinergic side effect profile (and outside chance it is contributing to transaminitis) while introducing risperidone (please see daily plan for changes). Patient may not require direct equivalent dose of Risperdal, as Zyprexa's anticholinergic effects could be contributing to presentation and Risperdal is less likely to cause same side effects. Would like to increase depakote if LFTs improve as he seemed to have improved behavior at a higher dose; if there is no improvement early in week of 3/13 will likely need to discontinue this medication.  ? ?3/16: Patient  appears to have more awareness of the behavior episodes that led to his PRN requirements and is aware is being actively defiant of what is being asked. Patient is managing being out of restraints fairly well. Again patient appears significantly improved from 2 weeks ago.  ? ?Diagnoses:  ?Active Hospital problems: ?Principal Problem: ?  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  Skin abnormalities/perineal MASD ?  Physical deconditioning 2/2 ambulatory dysfunction ?  Right ventricular dysfunction ?  Witnessed seizure ([place]) ?  Protein calorie malnutrition ([place]) ?  Ambulatory dysfunction ?  Eczema ?  Pressure injury of skin ?  Constipation ?  ? ? ?Plan  ?## Safety and Observation Level:  ?- Based on my clinical evaluation, I estimate the patient to be at moderate risk of self harm in the current setting, but requires redirection to keep himself from unintentional self harm. ?- At this time, we recommend a close level of observation. This decision is based on my review of the chart including patient's history and current presentation, interview of the patient, mental status examination, and consideration of suicide risk including evaluating suicidal ideation, plan, intent, suicidal or self-harm behaviors, risk factors, and protective factors. This judgment is based on our ability to directly address suicide risk, implement suicide prevention strategies and develop a safety plan while the patient is in the clinical setting. Please contact our team if there is a concern that risk level has changed. ?  ?  ?## Medications:  ?-- Continue klonopin 0.49m BID ?-- Continue Prozac 439m?- Continue Melatonin 106mHS ?- Continue Trazodone 100m74mS  ?-- Discontinue Am Zyprexa and continue and 10mg38m ?-- Continue Depakene  5185m BID ?-- Depakote lvl and CMP 3/17 ?- Continue Risperdal 1585mQHS ?- May continue Zyprexa 85m49m8h PRN for agitation ?  ?## Medical Decision Making  Capacity:  ?-- Not formally assessed ?  ?## Further Work-up:  ?-- Per primary ? -- Recommend updated EKG, resulted below ?  ?-- most recent EKG on 3/8 had QtC of 462 ?-- Pertinent labwork reviewed earlier this admission includes: Depakote: 37 ?Most recent labs: CMP (sodium 138), LFT's trending up (3/8( AST 59, ALT 89).  PT-INR WNL ?3/4 CBC with low H/H and normalizing MCHC; Magnesium now WNL ?  ?## Disposition:  ?-- Per primary ?  ?## Behavioral / Environmental:  ?1: Provide appropriate lighting and clear signage; a clock and calendar should be easily visible to the patient. ?2: Continue to provide activities for patient ( coloring, word search, book) ?3: Reorient the patient to person, place, time and situation on each encounter.  ?4: Minimize use of Antipsychotics PRN. Patient benefits from constant observation by a sitter. ?  ?##Legal Status ?  ?  ?Thank you for this consult request. Recommendations have been communicated to the primary team.  We will continue to follow at this time.  ?  ?Pgy-2 ?JaiFreida BusmanD ? ? ?followup history  ?Relevant Aspects of Hospital Course:  ?Admitted on 01/05/2021 for Korsakoff. ? ?Patient Report:  ?On assessment this AM patient is oriented to person, place, year and day of the week. Patient confabulates the month. Patient recognizes the provider as a psychiatrist.  Patient reports that he feels he is sleeping well, but does recall getting PRN Zyprexa. Patient reports that he recalls a males staff telling him to stay in the room and the patient reports he did not want to and told him this and eventually this led to him getting the PRN. Patient reports that otherwise he is doing well and continues to have a good appetite. Patient endorses that he hasn't seen the "baby cats" for a while. Patient denies SI, HI and AVH.  ? ? ?Patient is up to chair today, clean, hair combed and dentures in. At the end of assessment he remembered that he had not met the med student prior to this  assessment and separately told her " it was nice to meet you."  ? ? ?Collateral information:  ?No one has been able to reach his son this month. ?  ?Psychiatric History:  ?MDD, anxiety.  ?Vistaril 50 mg every 4 hours needed for anxiety, Prozac 40 mg daily and trazodone 50 mg at bedtime.  ?  ?Family psych history: None known per EMR. ?  ?  ?Social History:  ?  ?  ?Tobacco use: Defer ?Alcohol use: Defer ?Drug use: Defer ?  ? ?Family History:  ?The patient's family history is not on file. ? ?Medical History: ?History reviewed. No pertinent past medical history. ? ?Surgical History: ?History reviewed. No pertinent surgical history. ? ?Medications:  ? ?Current Facility-Administered Medications:  ?  acetaminophen (TYLENOL) tablet 650 mg, 650 mg, Oral, Q4H PRN, Shalhoub, GeoSherryll BurgerD, 650 mg at 03/31/21 2202 ?  aspirin tablet 325 mg, 325 mg, Oral, Daily, 325 mg at 03/31/21 0854 **OR** [DISCONTINUED] aspirin suppository 300 mg, 300 mg, Rectal, Daily, KirGreta DoomD ?  clonazePAM (KLBobbye Charlestonablet 0.5 mg, 0.5 mg, Oral, 2 times per day, SinThurnell LoseD, 0.5 mg at 03/31/21 1816 ?  docusate sodium (COLACE) capsule 100 mg, 100 mg, Oral, BID, DesSpero GeraldsD, 100 mg at 03/31/21  2200 ?  feeding supplement (ENSURE ENLIVE / ENSURE PLUS) liquid 237 mL, 237 mL, Oral, TID AC & HS, Samella Parr, NP, 237 mL at 03/31/21 2206 ?  FLUoxetine (PROZAC) capsule 40 mg, 40 mg, Oral, Daily, Dahal, Binaya, MD, 40 mg at 03/31/21 0854 ?  folic acid (FOLVITE) tablet 1 mg, 1 mg, Oral, Daily, Vann, Jessica U, DO, 1 mg at 03/31/21 0854 ?  hydrocortisone cream 1 %, , Topical, QID, Samella Parr, NP, Given at 03/31/21 2205 ?  levETIRAcetam (KEPPRA) tablet 500 mg, 500 mg, Oral, BID, Hammons, Kimberly B, RPH, 500 mg at 03/31/21 2201 ?  MEDLINE mouth rinse, 15 mL, Mouth Rinse, BID, Olalere, Adewale A, MD, 15 mL at 03/31/21 2205 ?  melatonin tablet 10 mg, 10 mg, Oral, QHS, Ghimire, Shanker M, MD, 10 mg at 03/31/21 2203 ?  nicotine  (NICODERM CQ - dosed in mg/24 hours) patch 21 mg, 21 mg, Transdermal, Daily, Dahal, Binaya, MD, 21 mg at 03/31/21 0855 ?  [DISCONTINUED] OLANZapine zydis (ZYPREXA) disintegrating tablet 2.5 mg, 2.5 mg, Oral, Q8H

## 2021-04-01 NOTE — Progress Notes (Signed)
Speech Language Pathology Treatment: Cognitive-Linquistic  ?Patient Details ?Name: Steve [name] ?MRN: [id number] ?DOB: November 07, 1965 ?Today's Date: 04/01/2021 ?Time: 8563-1497 ?SLP Time Calculation (min) (ACUTE ONLY): 19 min ? ?Assessment / Plan / Recommendation ?Clinical Impression ? Pt was seen for cognitive-linguistic treatment. He stated that his memory concerns him and that "It makes me feel dumb sometimes when I can't remember stuff." Meds were administered during the session and his door was frequently opened as various staff entered and left. He did not appear overtly more distracted than during prior sessions, but the impact of these factors on his performance is considered. He demonstrated 100% accuracy with delayed (1-minute) recall of one item. He achieved 20% accuracy with delayed recall of two items increasing to 80% with cues. He completed a simple sequencing task (i.e., frying an egg, and cooking oatmeal) with a single prompt for accuracy in details. He completed the app-based attention task with 48% accuracy given verbal prompts to attend to the salient details. He exhibited an average response time of 31.46 seconds with a range of 4.01-87.03 seconds. Pt's frequency will be reduced to once per week, but SLP will continue to follow pt.   ?  ?HPI HPI: Pt is a 56 y.o. male admitted from group home (substance abuse rehab?) on 01/05/21 with witnessed seizure, AMS. 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 EEG unremarkable. LP on 12/21 showed staph in CSF, felt to be contaminant. Workup for AKI, rhabdomyolysis. Dx Uremic encephalopathy. Course complicated by c/o L foot pain; imaging negative for acute injury. Pt remains hospitalized due to difficulty with placement. SLP evaluation on 01/26/21: 9/27 on Mini Mental State Exam, SLUMS 02/03/21: 2/30; SLUMS 1/27: 1/30. Psych consulted and as of  1/16, pt did not meet criteria for inpatient psychiatric admission. ?  ?   ?SLP Plan ? Continue with current plan of care ? ?  ?  ?Recommendations for follow up therapy are one component of a multi-disciplinary discharge planning process, led by the attending physician.  Recommendations may be updated based on patient status, additional functional criteria and insurance authorization. ?  ? ?Recommendations  ?   ?   ?    ?   ? ? ? ? Oral Care Recommendations: Oral care BID ?Follow Up Recommendations: Skilled nursing-short term rehab (<3 hours/day) ?Assistance recommended at discharge: Frequent or constant Supervision/Assistance ?SLP Visit Diagnosis: Cognitive communication deficit (R41.841);Attention and concentration deficit ?Attention and concentration deficit following: Other cerebrovascular disease ?Plan: Continue with current plan of care ? ? ? ? ?  ?  ?Itzamar Traynor I. [name], MS, CCC-SLP ?Acute Rehabilitation Services ?Office number [phone] ?Pager [phone] ? ? ?Steve [name] ? ?04/01/2021, 11:47 AM ? ? ? ?

## 2021-04-01 NOTE — Progress Notes (Signed)
Physical Therapy Treatment ?Patient Details ?Name: Steve [name] ?MRN: [id number] ?DOB: 04/30/1965 ?Today's Date: 04/01/2021 ? ? ?History of Present Illness Pt is a 56 y.o. male admitted from group home for convalescence, combativeness and altered sensorium on 01/05/21 with witnessed seizure, AMS. 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 EEG unremarkable. LP on 12/21 showed staph in CSF, felt to be contaminant. Workup for AKI, rhabdomyolysis. Course complicated by c/o L foot pain; imaging negative for acute injury. Pt remains hospitalized due to difficult to place. Pt with psych consult who felt no need for inpt psych. No PMH in chart. ? ?  ?PT Comments  ? ? Making notable improvements in mobility/gait safety.  Cognition has improved, but still not safe to be alone due to not safety aware. ?   ?Recommendations for follow up therapy are one component of a multi-disciplinary discharge planning process, led by the attending physician.  Recommendations may be updated based on patient status, additional functional criteria and insurance authorization. ? ?Follow Up Recommendations ? Skilled nursing-short term rehab (<3 hours/day) ?  ?  ?Assistance Recommended at Discharge Frequent or constant Supervision/Assistance  ?Patient can return home with the following A little help with walking and/or transfers;A little help with bathing/dressing/bathroom;Assist for transportation;Help with stairs or ramp for entrance ?  ?Equipment Recommendations ?    ?  ?Recommendations for Other Services   ? ? ?  ?Precautions / Restrictions Precautions ?Precautions: Fall ?Precaution Comments: has a sitter ?Restrictions ?Weight Bearing Restrictions: No  ?  ? ?Mobility ? Bed Mobility ?  ?  ?  ?  ?  ?  ?  ?General bed mobility comments: OOB at nursing desk on arrival ?  ? ?Transfers ?  ?  ?  ?  ?  ?  ?  ?  ?  ?General transfer  comment: left with sittier in the hall after session ?  ? ?Ambulation/Gait ?Ambulation/Gait assistance: Min guard ?Gait Distance (Feet): 1000 Feet (plus) ?Assistive device: None ?Gait Pattern/deviations: Step-through pattern ?Gait velocity: reduced, variable ?Gait velocity interpretation: 1.31 - 2.62 ft/sec, indicative of limited community ambulator ?  ?General Gait Details: still mildly unsteady with occasional deviation, but no LOB.  Due to limitations of L foot, pt unable to speed up gait significantly, but keeps a moderate cadence.  No able to scan and turn moderately abruptly without overt deviation and no LOB. ? ? ?Stairs ?Stairs: Yes ?Stairs assistance: Min guard ?Stair Management: Two rails, Alternating pattern, Forwards ?Number of Stairs: 20 (down the steps) ?General stair comments: safe with 2 rails down, but not able to soften descent with eccentric plantarflexors well. ? ? ?Wheelchair Mobility ?  ? ?Modified Rankin (Stroke Patients Only) ?  ? ? ?  ?Balance   ?  ?Sitting balance-Leahy Scale: Good ?  ?  ?  ?Standing balance-Leahy Scale: Fair ?  ?  ?  ?  ?  ?  ?  ?  ?  ?  ?  ?  ?  ? ?  ?Cognition Arousal/Alertness: Awake/alert ?Behavior During Therapy: [facility] for tasks assessed/performed ?Overall Cognitive Status: Impaired/Different from baseline ?Area of Impairment: Orientation, Attention, Memory, Following commands, Safety/judgement, Awareness, Problem solving ?  ?  ?  ?  ?  ?  ?  ?  ?Orientation Level: Time, Situation ?Current Attention Level: Selective ?Memory: Decreased recall of precautions, Decreased short-term memory ?Following Commands: Follows one step commands with increased time,  Follows one step commands inconsistently ?Safety/Judgement: Decreased awareness of safety, Decreased awareness of deficits ?Awareness: Emergent, Intellectual ?Problem Solving: Slow processing, Requires verbal cues, Requires tactile cues ?General Comments: Pt very pleasant this session, following all simple commands with no  difficulties. ?  ?  ? ?  ?Exercises   ? ?  ?General Comments General comments (skin integrity, edema, etc.): pt now starting to recognize his environment and know where his room is .  Sitter still with pt. ?  ?  ? ?Pertinent Vitals/Pain Pain Assessment ?Pain Assessment: No/denies pain  ? ? ?Home Living   ?  ?  ?  ?  ?  ?  ?  ?  ?  ?   ?  ?Prior Function    ?  ?  ?   ? ?PT Goals (current goals can now be found in the care plan section) Acute Rehab PT Goals ?Time For Goal Achievement: 04/02/21 ?Potential to Achieve Goals: Fair ?Progress towards PT goals: Progressing toward goals ? ?  ?Frequency ? ? ? Min 2X/week ? ? ? ?  ?PT Plan Current plan remains appropriate  ? ? ?Co-evaluation   ?  ?  ?  ?  ? ?  ?AM-PAC PT "6 Clicks" Mobility   ?Outcome Measure ? Help needed turning from your back to your side while in a flat bed without using bedrails?: A Little ?Help needed moving from lying on your back to sitting on the side of a flat bed without using bedrails?: A Little ?Help needed moving to and from a bed to a chair (including a wheelchair)?: A Little ?Help needed standing up from a chair using your arms (e.g., wheelchair or bedside chair)?: A Little ?Help needed to walk in hospital room?: A Little ?Help needed climbing 3-5 steps with a railing? : A Little ?6 Click Score: 18 ? ?  ?End of Session   ?Activity Tolerance: Patient tolerated treatment well ?Patient left: with nursing/sitter in room ?Nurse Communication: Mobility status ?PT Visit Diagnosis: Other abnormalities of gait and mobility (R26.89);Other symptoms and signs involving the nervous system (R29.898) ?  ? ? ?Time: 0100-7121 ?PT Time Calculation (min) (ACUTE ONLY): 14 min ? ?Charges:  $Gait Training: 8-22 mins          ?          ? ?04/01/2021 ? ?Jacinto Halim., PT ?Acute Rehabilitation Services ?[phone]  (pager) ?[phone]  (office) ? ? ?Eliseo Gum Azura Tufaro ?04/01/2021, 6:33 PM ? ?

## 2021-04-01 NOTE — Progress Notes (Signed)
Patient was denied admission to [facility] due to recent behaviors. ? ?CSW updated [facility] of information. ? ?Madilyn Fireman, MSW, LCSW ?Transitions of Care  Clinical Social Worker II ?[phone] ? ?

## 2021-04-01 NOTE — Progress Notes (Signed)
?PROGRESS NOTE ? ? ? ?Steve [name]  HBZ:[id number] DOB: 07/07/1965 DOA: 01/05/2021 ?PCP: No primary care provider on file.  ? ? ?Brief Narrative:  ?56 year old gentleman with history of major depression anxiety and alcohol use disorder who was brought to emergency room from residential alcohol treatment center where he was staying for 5 months for combativeness and altered sensorium.  He remains in the hospital since then with his behavioral issues.  Initial presentation to the emergency room, he was confused and combative.  Had evidence of acute kidney injury with creatinine 3.27, WBC count 23.7 and lactic acid 7.6.  CT head was negative.  Infection work-up was negative.  Initially plan for hemodialysis, however did not require dialysis.  He was ultimately stabilized and transferred to medical floor.  He remains in the hospital with not able to find placement.  Actively followed by psychiatry. ? ? ?Assessment & Plan: ?  ?Acute renal failure, resolved.  Normalized. ?Severe sepsis present on admission, resolved.  Normalized. ? 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 ?Initial MRI revealed tiny acute infarct on the right but not significant enough to explain persistent cognitive and behavioral abnormalities. ?Patient on multiple antipsychotic medications and actively followed by neurology.  Currently remains on ?Klonopin 0.5 mg twice daily, fluoxetine 40 mg daily, Keppra 500 mg twice daily, melatonin 10 mg at night, olanzapine 5 mg in the morning, 10 mg at night, risperidone 1 mg at night, Depakote 500 mg twice daily, trazodone 100 mg at night.  Remains on thiamine and folic acid. ?Continue all-time fall precautions.  Continue safety precautions, observation. ? ?Possible DVT: ?Patient is on Xarelto.  No documented DVT.  Critical care indicated that when they removed hemodialysis catheter he had a clot on it.  Remains on Xarelto since  then. ?Catheter associated blood clot, catheter removed.   ?Patient does not need full dose anticoagulation, will keep on DVT prophylaxis dose of Xarelto. ? ?Discussed with multidisciplinary team.  Patient with cognitive deficits, unsafe discharge.  He does have underlying psychiatric diagnosis.  Patient will need close observation and placement.  Currently not IVC. ?He can be redirected and behavior controlled with Zyprexa. ?If patient attempts to leave, will try to redirect him to keep him in the hospital as much as possible.  If defiant, will sign IVC at that time. ? ? ?DVT prophylaxis: rivaroxaban (XARELTO) tablet 10 mg Start: 02/27/21 1000 ?rivaroxaban (XARELTO) tablet 10 mg  ? ?Code Status: Full code ?Family Communication: None ?Disposition Plan: Status is: Inpatient ? ?Remains inpatient appropriate because: No safe disposition plans. ? ? ?  ? ? ?Consultants:  ?Multiple.  Currently actively followed by psychiatry. ? ?Procedures:  ?Multiple procedures.  Currently none. ? ?Antimicrobials:  ?Completed oral antibiotics. ? ? ?Subjective: ? ?Patient seen and examined.  Yesterday afternoon, he wanted to pack up and leave but I was able to convince him to stay back. ?Middle of the night he got agitated and banging on the door, was able to be redirected.  Today he is not talking much but feels fine.  Sitting in chair. ? ?Objective: ?Vitals:  ? 03/31/21 0718 03/31/21 1211 03/31/21 1623 04/01/21 0854  ?BP: 106/72 102/84 97/73 (P) 110/88  ?Pulse: 79 75 88 (P) 92  ?Resp: 19 18 20  (P) 18  ?Temp: 97.6 ?F (36.4 ?C) 97.8 ?F (36.6 ?C) 98.2 ?F (36.8 ?C) (P) 98 ?F (36.7 ?C)  ?TempSrc: Oral Oral Oral (P) Oral  ?SpO2: 100% 100% 99% (P) 96%  ?  Weight:      ?Height:      ? ? ?Intake/Output Summary (Last 24 hours) at 04/01/2021 1044 ?Last data filed at 04/01/2021 0758 ?Gross per 24 hour  ?Intake 1160 ml  ?Output --  ?Net 1160 ml  ? ?Filed Weights  ? 02/27/21 0501 03/05/21 0500 03/06/21 0500  ?Weight: 60.7 kg 57.9 kg 64.3 kg   ? ? ?Examination: ? ?Laying in bed.  On room air.  Looks comfortable.  Sitting in chair.  Unhappy but not agitated.  Not interested in talking. ?Chest: Bilateral clear ?Abdomen: Soft and nontender. ?Extremities: No cyanosis or clubbing.  No deformities. ? ? ? ?Data Reviewed: I have personally reviewed following labs and imaging studies ? ?CBC: ?Recent Labs  ?Lab 03/27/21 ?0148  ?WBC 4.8  ?NEUTROABS 1.6*  ?HGB 11.0*  ?HCT 32.4*  ?MCV 96.4  ?PLT 257  ? ?Basic Metabolic Panel: ?Recent Labs  ?Lab 03/27/21 ?0148  ?NA 136  ?K 3.9  ?CL 103  ?CO2 27  ?GLUCOSE 105*  ?BUN 20  ?CREATININE 0.61  ?CALCIUM 9.1  ? ?GFR: ?Estimated Creatinine Clearance: 94.9 mL/min (by C-G formula based on SCr of 0.61 mg/dL). ?Liver Function Tests: ?Recent Labs  ?Lab 03/27/21 ?0148  ?AST 60*  ?ALT 97*  ?ALKPHOS 39  ?BILITOT 0.3  ?PROT 6.3*  ?ALBUMIN 3.2*  ? ?No results for input(s): LIPASE, AMYLASE in the last 168 hours. ?No results for input(s): AMMONIA in the last 168 hours. ?Coagulation Profile: ?No results for input(s): INR, PROTIME in the last 168 hours. ?Cardiac Enzymes: ?No results for input(s): CKTOTAL, CKMB, CKMBINDEX, TROPONINI in the last 168 hours. ?BNP (last 3 results) ?No results for input(s): PROBNP in the last 8760 hours. ?HbA1C: ?No results for input(s): HGBA1C in the last 72 hours. ?CBG: ?No results for input(s): GLUCAP in the last 168 hours. ?Lipid Profile: ?No results for input(s): CHOL, HDL, LDLCALC, TRIG, CHOLHDL, LDLDIRECT in the last 72 hours. ?Thyroid Function Tests: ?No results for input(s): TSH, T4TOTAL, FREET4, T3FREE, THYROIDAB in the last 72 hours. ?Anemia Panel: ?No results for input(s): VITAMINB12, FOLATE, FERRITIN, TIBC, IRON, RETICCTPCT in the last 72 hours. ?Sepsis Labs: ?No results for input(s): PROCALCITON, LATICACIDVEN in the last 168 hours. ? ?No results found for this or any previous visit (from the past 240 hour(s)).  ? ? ? ? ? ?Radiology Studies: ?No results found. ? ? ? ? ? ?Scheduled Meds: ? aspirin  325  mg Oral Daily  ? clonazePAM  0.5 mg Oral 2 times per day  ? docusate sodium  100 mg Oral BID  ? feeding supplement  237 mL Oral TID AC & HS  ? FLUoxetine  40 mg Oral Daily  ? folic acid  1 mg Oral Daily  ? hydrocortisone cream   Topical QID  ? levETIRAcetam  500 mg Oral BID  ? mouth rinse  15 mL Mouth Rinse BID  ? melatonin  10 mg Oral QHS  ? nicotine  21 mg Transdermal Daily  ? OLANZapine  10 mg Oral QHS  ? polyethylene glycol  17 g Oral Daily  ? risperiDONE  1 mg Oral QHS  ? rivaroxaban  10 mg Oral Daily  ? selenium sulfide   Topical Daily  ? thiamine  250 mg Oral Daily  ? traZODone  100 mg Oral QHS  ? valproic acid  500 mg Oral BID  ? ?Continuous Infusions: ? ? LOS: 86 days  ? ? ?Time spent: 25 minutes ? ? ? ?Dorcas Carrow, MD ?[facility] ?  Pager [phone]  ?

## 2021-04-01 NOTE — Progress Notes (Signed)
Occupational Therapy Treatment ?Patient Details ?Name: Steve [name] ?MRN: [id number] ?DOB: [birth year] ?Today's Date: 04/01/2021 ? ? ?History of present illness Pt is a 56 y.o. male admitted from group home for convalescence, combativeness and altered sensorium on 01/05/21 with witnessed seizure, AMS. 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 EEG unremarkable. LP on 12/21 showed staph in CSF, felt to be contaminant. Workup for AKI, rhabdomyolysis. Course complicated by c/o L foot pain; imaging negative for acute injury. Pt remains hospitalized due to difficult to place. Pt with psych consult who felt no need for inpt psych. No PMH in chart. ?  ?OT comments ? Pt demonstrating improved activity tolerance and balance this session. He was very pleasant and agreeable to all tasks. He followed all simple commands with no difficulty and carried on a appropriate conversation throughout session. He continues to be confused and states that he is at work and will be going home at the end of the day. Cognition and mobility wax and wane each session, however functionally pt appears to be trending in a more positive direction. OT will continue to follow acutely.   ? ?Recommendations for follow up therapy are one component of a multi-disciplinary discharge planning process, led by the attending physician.  Recommendations may be updated based on patient status, additional functional criteria and insurance authorization. ?   ?Follow Up Recommendations ? Skilled nursing-short term rehab (<3 hours/day)  ?  ?Assistance Recommended at Discharge Frequent or constant Supervision/Assistance  ?Patient can return home with the following ? A little help with walking and/or transfers;A little help with bathing/dressing/bathroom;Assistance with cooking/housework;Assist for transportation;Help with stairs or ramp for  entrance ?  ?Equipment Recommendations ? None recommended by OT  ?  ?Recommendations for Other Services   ? ?  ?Precautions / Restrictions Precautions ?Precautions: Fall ?Precaution Comments: has a sitter ?Restrictions ?Weight Bearing Restrictions: No  ? ? ?  ? ?Mobility Bed Mobility ?Overal bed mobility: Modified Independent ?  ?  ?  ?  ?  ?  ?General bed mobility comments: No assist needed this session ?  ? ?Transfers ?Overall transfer level: Needs assistance ?Equipment used: None ?Transfers: Sit to/from Stand ?Sit to Stand: Min guard ?  ?  ?  ?  ?  ?General transfer comment: Min guard for safety, no hands on assist needed ?  ?  ?Balance Overall balance assessment: Needs assistance ?Sitting-balance support: Feet supported, No upper extremity supported ?Sitting balance-Leahy Scale: Good ?  ?  ?Standing balance support: No upper extremity supported, During functional activity ?Standing balance-Leahy Scale: Fair ?Standing balance comment: min guard when pt stands and steps immediately ?  ?  ?  ?  ?  ?  ?  ?  ?  ?  ?  ?   ? ?ADL either performed or assessed with clinical judgement  ? ?ADL Overall ADL's : Needs assistance/impaired ?  ?  ?Grooming: Wash/dry hands;Wash/dry face;Oral care;Supervision/safety;Standing ?Grooming Details (indicate cue type and reason): Pt was standing at the sink completing grooming tasks on entry, no difficulties noted ?  ?  ?  ?  ?  ?  ?  ?  ?Toilet Transfer: Min guard;Ambulation ?Toilet Transfer Details (indicate cue type and reason): No difficulties this session ?Toileting- Clothing Manipulation and Hygiene: Min guard;Sitting/lateral lean ?Toileting - Clothing Manipulation Details (indicate cue type and reason): close min guard ?  ?  ?Functional mobility during ADLs: Min  guard ?General ADL Comments: Pt continues to be highly distractable ?  ? ?Extremity/Trunk Assessment   ?  ?  ?  ?  ?  ? ?Vision   ?  ?  ?Perception   ?  ?Praxis   ?  ? ?Cognition Arousal/Alertness: Awake/alert ?Behavior  During Therapy: [facility] for tasks assessed/performed ?Overall Cognitive Status: Impaired/Different from baseline ?Area of Impairment: Orientation, Attention, Memory, Following commands, Safety/judgement, Awareness, Problem solving ?  ?  ?  ?  ?  ?  ?  ?  ?Orientation Level: Time, Situation ?Current Attention Level: Selective ?Memory: Decreased recall of precautions, Decreased short-term memory ?Following Commands: Follows one step commands with increased time, Follows one step commands inconsistently ?Safety/Judgement: Decreased awareness of safety, Decreased awareness of deficits ?Awareness: Emergent, Intellectual ?Problem Solving: Slow processing, Requires verbal cues, Requires tactile cues ?General Comments: Pt very pleasant this session, following all simple commands with no difficulties. ?  ?  ?   ?Exercises   ? ?  ?Shoulder Instructions   ? ? ?  ?General Comments VSS on RA, sitter in room  ? ? ?Pertinent Vitals/ Pain       Pain Assessment ?Pain Assessment: No/denies pain ? ?Home Living   ?  ?  ?  ?  ?  ?  ?  ?  ?  ?  ?  ?  ?  ?  ?  ?  ?  ?  ? ?  ?Prior Functioning/Environment    ?  ?  ?  ?   ? ?Frequency ? Min 1X/week  ? ? ? ? ?  ?Progress Toward Goals ? ?OT Goals(current goals can now be found in the care plan section) ? Progress towards OT goals: Progressing toward goals ? ?Acute Rehab OT Goals ?Patient Stated Goal: To go home (from work) ?OT Goal Formulation: With patient ?Time For Goal Achievement: 04/12/21 ?Potential to Achieve Goals: Fair ?ADL Goals ?Pt Will Perform Grooming: with supervision;standing ?Pt Will Perform Lower Body Bathing: with set-up;sit to/from stand ?Pt Will Perform Lower Body Dressing: with set-up;sitting/lateral leans ?Pt Will Transfer to Toilet: with supervision;ambulating ?Pt Will Perform Toileting - Clothing Manipulation and hygiene: with supervision;sitting/lateral leans ?Additional ADL Goal #1: Pt will demonstrate increased attention to task, requiring less than 3 vc during a 5 min  ADL task  ?Plan Discharge plan remains appropriate   ? ?Co-evaluation ? ? ?   ?  ?  ?  ?  ? ?  ?AM-PAC OT "6 Clicks" Daily Activity     ?Outcome Measure ? ? Help from another person eating meals?: A Little ?Help from another person taking care of personal grooming?: A Little ?Help from another person toileting, which includes using toliet, bedpan, or urinal?: A Little ?Help from another person bathing (including washing, rinsing, drying)?: A Little ?Help from another person to put on and taking off regular upper body clothing?: A Little ?Help from another person to put on and taking off regular lower body clothing?: A Little ?6 Click Score: 18 ? ?  ?End of Session Equipment Utilized During Treatment: Gait belt ? ?OT Visit Diagnosis: Unsteadiness on feet (R26.81);Muscle weakness (generalized) (M62.81);Low vision, both eyes (H54.2);Ataxia, unspecified (R27.0) ?Pain - Right/Left: Left ?Pain - part of body: Leg;Ankle and joints of foot ?  ?Activity Tolerance Patient tolerated treatment well ?  ?Patient Left in bed;with call bell/phone within reach;with nursing/sitter in room ?  ?Nurse Communication Mobility status ?  ? ?   ? ?Time: 1250-1300 ?OT Time Calculation (min): 10 min ? ?  Charges: OT General Charges ?$OT Visit: 1 Visit ?OT Treatments ?$Therapeutic Activity: 8-22 mins ? ?Latandra Loureiro H., OTR/L ?Acute Rehabilitation ? ?Aragon Scarantino Elane Bing Plume ?04/01/2021, 6:29 PM ?

## 2021-04-01 NOTE — Plan of Care (Signed)
  Problem: Health Behavior/Discharge Planning: Goal: Ability to manage health-related needs will improve Outcome: Progressing   Problem: Clinical Measurements: Goal: Ability to maintain clinical measurements within normal limits will improve Outcome: Progressing Goal: Will remain free from infection Outcome: Progressing   

## 2021-04-01 NOTE — Plan of Care (Signed)
Pt is alert oriented x 2. Pt has sitter present in room. Pt was resting but has become increasingly restless and agitated. Pt banging on door, trying to leave. PRN zyprexa given per order.  No distress noted. ? ? ?Problem: Health Behavior/Discharge Planning: ?Goal: Ability to manage health-related needs will improve ?Outcome: Progressing ?  ?Problem: Clinical Measurements: ?Goal: Ability to maintain clinical measurements within normal limits will improve ?Outcome: Progressing ?Goal: Will remain free from infection ?Outcome: Progressing ?Goal: Diagnostic test results will improve ?Outcome: Progressing ?Goal: Respiratory complications will improve ?Outcome: Progressing ?Goal: Cardiovascular complication will be avoided ?Outcome: Progressing ?  ?Problem: Activity: ?Goal: Risk for activity intolerance will decrease ?Outcome: Progressing ?  ?Problem: Nutrition: ?Goal: Adequate nutrition will be maintained ?Outcome: Progressing ?  ?Problem: Coping: ?Goal: Level of anxiety will decrease ?Outcome: Progressing ?  ?Problem: Elimination: ?Goal: Will not experience complications related to bowel motility ?Outcome: Progressing ?Goal: Will not experience complications related to urinary retention ?Outcome: Progressing ?  ?Problem: Pain Managment: ?Goal: General experience of comfort will improve ?Outcome: Progressing ?  ?Problem: Safety: ?Goal: Ability to remain free from injury will improve ?Outcome: Progressing ?  ?Problem: Skin Integrity: ?Goal: Risk for impaired skin integrity will decrease ?Outcome: Progressing ?  ?Problem: Education: ?Goal: Expressions of having a comfortable level of knowledge regarding the disease process will increase ?Outcome: Progressing ?  ?Problem: Coping: ?Goal: Ability to adjust to condition or change in health will improve ?Outcome: Progressing ?Goal: Ability to identify appropriate support needs will improve ?Outcome: Progressing ?  ?Problem: Health Behavior/Discharge Planning: ?Goal: Compliance  with prescribed medication regimen will improve ?Outcome: Progressing ?  ?Problem: Medication: ?Goal: Risk for medication side effects will decrease ?Outcome: Progressing ?  ?Problem: Clinical Measurements: ?Goal: Complications related to the disease process, condition or treatment will be avoided or minimized ?Outcome: Progressing ?Goal: Diagnostic test results will improve ?Outcome: Progressing ?  ?Problem: Safety: ?Goal: Verbalization of understanding the information provided will improve ?Outcome: Progressing ?  ?Problem: Self-Concept: ?Goal: Level of anxiety will decrease ?Outcome: Progressing ?Goal: Ability to verbalize feelings about condition will improve ?Outcome: Progressing ?  ?

## 2021-04-01 NOTE — Plan of Care (Signed)
Pt is oriented x 3. Pts bed alarm on. No distress noted. Pt c/o pain to left foot, pain, prn tylenol given per order. Pt resting.  ? ? ?Problem: Health Behavior/Discharge Planning: ?Goal: Ability to manage health-related needs will improve ?Outcome: Progressing ?  ?Problem: Clinical Measurements: ?Goal: Ability to maintain clinical measurements within normal limits will improve ?Outcome: Progressing ?Goal: Will remain free from infection ?Outcome: Progressing ?Goal: Diagnostic test results will improve ?Outcome: Progressing ?Goal: Respiratory complications will improve ?Outcome: Progressing ?Goal: Cardiovascular complication will be avoided ?Outcome: Progressing ?  ?Problem: Activity: ?Goal: Risk for activity intolerance will decrease ?Outcome: Progressing ?  ?Problem: Nutrition: ?Goal: Adequate nutrition will be maintained ?Outcome: Progressing ?  ?Problem: Nutrition: ?Goal: Adequate nutrition will be maintained ?Outcome: Progressing ?  ?Problem: Coping: ?Goal: Level of anxiety will decrease ?Outcome: Progressing ?  ?Problem: Elimination: ?Goal: Will not experience complications related to bowel motility ?Outcome: Progressing ?Goal: Will not experience complications related to urinary retention ?Outcome: Progressing ?  ?Problem: Pain Managment: ?Goal: General experience of comfort will improve ?Outcome: Progressing ?  ?Problem: Safety: ?Goal: Ability to remain free from injury will improve ?Outcome: Progressing ?  ?Problem: Skin Integrity: ?Goal: Risk for impaired skin integrity will decrease ?Outcome: Progressing ?  ?Problem: Education: ?Goal: Expressions of having a comfortable level of knowledge regarding the disease process will increase ?Outcome: Progressing ?  ?Problem: Coping: ?Goal: Ability to adjust to condition or change in health will improve ?Outcome: Progressing ?Goal: Ability to identify appropriate support needs will improve ?Outcome: Progressing ?  ?Problem: Health Behavior/Discharge  Planning: ?Goal: Compliance with prescribed medication regimen will improve ?Outcome: Progressing ?  ?Problem: Medication: ?Goal: Risk for medication side effects will decrease ?Outcome: Progressing ?  ?Problem: Clinical Measurements: ?Goal: Complications related to the disease process, condition or treatment will be avoided or minimized ?Outcome: Progressing ?Goal: Diagnostic test results will improve ?Outcome: Progressing ?  ?Problem: Safety: ?Goal: Verbalization of understanding the information provided will improve ?Outcome: Progressing ?  ?Problem: Self-Concept: ?Goal: Level of anxiety will decrease ?Outcome: Progressing ?Goal: Ability to verbalize feelings about condition will improve ?Outcome: Progressing ?  ?

## 2021-04-02 DIAGNOSIS — E512 Wernicke's encephalopathy: Secondary | ICD-10-CM | POA: Diagnosis not present

## 2021-04-02 LAB — COMPREHENSIVE METABOLIC PANEL
ALT: 53 U/L — ABNORMAL HIGH (ref 0–44)
AST: 32 U/L (ref 15–41)
Albumin: 3.2 g/dL — ABNORMAL LOW (ref 3.5–5.0)
Alkaline Phosphatase: 36 U/L — ABNORMAL LOW (ref 38–126)
Anion gap: 8 (ref 5–15)
BUN: 17 mg/dL (ref 6–20)
CO2: 26 mmol/L (ref 22–32)
Calcium: 8.8 mg/dL — ABNORMAL LOW (ref 8.9–10.3)
Chloride: 105 mmol/L (ref 98–111)
Creatinine, Ser: 0.71 mg/dL (ref 0.61–1.24)
GFR, Estimated: >60 mL/min (ref >60)
Glucose, Bld: 97 mg/dL (ref 70–99)
Potassium: 3.7 mmol/L (ref 3.5–5.1)
Sodium: 139 mmol/L (ref 135–145)
Total Bilirubin: 0.2 mg/dL — ABNORMAL LOW (ref 0.3–1.2)
Total Protein: 6.1 g/dL — ABNORMAL LOW (ref 6.5–8.1)

## 2021-04-02 LAB — VALPROIC ACID LEVEL: Valproic Acid Lvl: 35 ug/mL — ABNORMAL LOW (ref 50.0–100.0)

## 2021-04-02 NOTE — TOC Progression Note (Signed)
Transition of Care (TOC) - Progression Note  ? ? ?Patient Details  ?Name: Steve [name] ?MRN: [id number] ?Date of Birth: [birth year] ? ?Transition of Care (TOC) CM/SW Contact  ?Janae Bridgeman, RN ?Phone Number: ?04/02/2021, 12:22 PM ? ?Clinical Narrative:    ?CM and MSW with DTP Team continue to follow the patient for SNF placement needs.  Glory Rosebush, SW with APS is continuing to follow the patient.  The patient has no bed offers but recently approved for LTC placement.  The patient will need disability as well and the [facility] is continuing to follow.  The patient will need SNF placement with locked unit and likely DSS guardian through [place] since [facility] Team has been unable to reach the patient's son by phone - APS is aware and following the patient.  Patient continues to require sitter at bedside for behaviors and impulsivity.   ? ? ?Expected Discharge Plan: Skilled Nursing Facility ?Barriers to Discharge: Continued Medical Work up, Homeless with medical needs, Inadequate or no insurance ? ?Expected Discharge Plan and Services ?Expected Discharge Plan: Skilled Nursing Facility ?In-house Referral: PCP / [facility] ?Discharge Planning Services: CM Consult, MATCH Program, Medication Assistance, Follow-up appt scheduled, [facility]igent [facility] ?  ?Living arrangements for the past 2 months: Group Home (Patient was resident at [facility] [place] prior to hospital admission.) ?Expected Discharge Date: 01/24/21               ?  ?  ?  ?  ?  ?  ?  ?  ?  ?  ? ? ?Social Determinants of Health (SDOH) Interventions ?  ? ?Readmission Risk Interventions ?No flowsheet data found. ? ?

## 2021-04-02 NOTE — Consult Note (Signed)
[facility] Psychiatry Followup Face-to-Face Psychiatric Evaluation ? ? ?Service Date: April 02, 2021 ?LOS:  LOS: 87 days  ? ? ?Assessment  ?Craigory Toste is a 56 y.o. male admitted medically for 01/05/2021  1:14 PM for full body seizure, AMS and combativeness. He carries the psychiatric diagnoses of MDD and and has a past medical history of Wernicke's encephalopathy and alcoholic dementia .Psychiatry was consulted for agitation by Susa Raring, MD.  ?  ?  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 Information on premorbid diagnoses is sparse; at minimum included severe AUD. Outpatient psychotropic medications include Prozac, Trazodone and gabapentin and historically he has had an unknown response to these medications. Focus of treatment in the hospital has been on controlling behaviors and impulsivity; this is difficult given ongoing confabulations (which will not respond to medications and are not being specifically targeted) and pt's relative inability to retain information (such as his need to stay in bed due to being a fall risk). This will likely become easier as he works with PT and is afforded more mobility.  Please see plan below for detailed recommendations. Current over-arching treatment is downtitration of olanzapine due to poor response at high doses, anticholinergic side effect profile (and outside chance it is contributing to transaminitis) while introducing risperidone (please see daily plan for changes). Patient may not require direct equivalent dose of Risperdal, as Zyprexa's anticholinergic effects could be contributing to presentation and Risperdal is less likely to cause same side effects. Would like to increase depakote if LFTs improve as he seemed to have improved behavior at a higher dose; if there is no improvement early in week of 3/13 will likely need to discontinue this medication.  ? ? ?3/17: Patient  continues to do well without behavior episodes. Patient is ambulating more and communicating his needs. Patient appears to have occasional hallucinations but they appear comforting to patient. Patient transmisnitis appears to be improving with the reduction in Zyprexa and patient behavior is stable.  ? ?Diagnoses:  ?Active Hospital problems: ?Principal Problem: ?  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  Skin abnormalities/perineal MASD ?  Physical deconditioning 2/2 ambulatory dysfunction ?  Right ventricular dysfunction ?  Witnessed seizure ([name]) ?  Protein calorie malnutrition ([name]) ?  Ambulatory dysfunction ?  Eczema ?  Pressure injury of skin ?  Constipation ?  ? ? ?Plan  ?## Safety and Observation Level:  ?- Based on my clinical evaluation, I estimate the patient to be at moderate risk of self harm in the current setting, but requires redirection to keep himself from unintentional self harm. ?- At this time, we recommend a close level of observation. This decision is based on my review of the chart including patient's history and current presentation, interview of the patient, mental status examination, and consideration of suicide risk including evaluating suicidal ideation, plan, intent, suicidal or self-harm behaviors, risk factors, and protective factors. This judgment is based on our ability to directly address suicide risk, implement suicide prevention strategies and develop a safety plan while the patient is in the clinical setting. Please contact our team if there is a concern that risk level has changed. ?  ?  ?## Medications:  ?-- Continue klonopin 0.5mg  BID ?-- Continue Prozac 40mg  ?- Continue Melatonin 10mg  QHS ?- Continue Trazodone 100mg  QHS  ?-- continue Zyprexa 10mg  QHS ?-- Continue Depakene 500mg  BID ?-- Depakote lvl  and CMP 3/17 ?- Continue Risperdal 1mg  QHS ?- May continue Zyprexa 5mg  q8h PRN for agitation ?  ?## Medical Decision Making  Capacity:  ?-- Not formally assessed ?  ?## Further Work-up:  ?-- Per primary ? -- Recommend updated EKG, resulted below ?  ?-- most recent EKG on 3/8 had QtC of 462 ?-- Pertinent labwork reviewed earlier this admission includes: Depakote: 37 ?Most recent labs: CMP (sodium 138), LFT's trending up (3/8( AST 59, ALT 89).  PT-INR WNL ?3/4 CBC with low H/H and normalizing MCHC; Magnesium now WNL ?  ?## Disposition:  ?-- Per primary ?  ?## Behavioral / Environmental:  ?1: Provide appropriate lighting and clear signage; a clock and calendar should be easily visible to the patient. ?2: Continue to provide activities for patient ( coloring, word search, book) ?3: Reorient the patient to person, place, time and situation on each encounter.  ?4: Minimize use of Antipsychotics PRN. Patient benefits from constant observation by a sitter. ?  ?##Legal Status ?  ?  ?Thank you for this consult request. Recommendations have been communicated to the primary team.  We will continue to follow at this time.  ?  ?Pgy-2 ? ?Bobbye MortonJai B Kayly Kriegel, MD ? ? ? followup history  ?Relevant Aspects of Hospital Course:  ?Admitted on 01/05/2021 for Korsakoff.. ? ?Patient Report:  ? ?On assessment this AM patient reports that he slept fairly well and recognizes the psych team. Patient endorses belief that he is being discharged today and he reports he is a bit upset about this. Sitter reports that this is not true. Patient reports that he recalls having a conversation and hearing that 2-3 Sw's on working on his case (which is accurate). Providers reassure patient that while Sw is working on his case he is not being discharged today. Patient also reports that [place] an [place]. Patient endorses that he has been told by multiple people that "the correct answer" is a city in [place]. Patient reports that he believes it is May but gets the year correct. Patient denies SI, HI and AH. Patient reports that he see's some dogs  outside and shows providers out the window that he see's one that he would like to adopt and name Alecia LemmingVictor. Providers note that patient [facility] outside his window.  ?  ?Collateral information:  ?No one has been able to reach his son this month. ?  ?Psychiatric History:  ?MDD, anxiety.  ?Vistaril 50 mg every 4 hours needed for anxiety, Prozac 40 mg daily and trazodone 50 mg at bedtime.  ?  ?Family psych history: None known per EMR. ?  ?  ?Social History:  ?  ?  ?Tobacco use: Defer ?Alcohol use: Defer ?Drug use: Defer ?  ? ?Family History:  ? ?The patient's family history is not on file. ? ?Medical History: ?History reviewed. No pertinent past medical history. ? ?Surgical History: ?History reviewed. No pertinent surgical history. ? ?Medications:  ? ?Current Facility-Administered Medications:  ?  acetaminophen (TYLENOL) tablet 650 mg, 650 mg, Oral, Q4H PRN, Shalhoub, Deno LungerGeorge J, MD, 650 mg at 04/01/21 2123 ?  aspirin tablet 325 mg, 325 mg, Oral, Daily, 325 mg at 04/02/21 1012 **OR** [DISCONTINUED] aspirin suppository 300 mg, 300 mg, Rectal, Daily, Rejeana BrockKirkpatrick, McNeill P, MD ?  clonazePAM Scarlette Calico(KLONOPIN) tablet 0.5 mg, 0.5 mg, Oral, 2 times per day, Leroy SeaSingh, Prashant K, MD, 0.5 mg at 04/02/21 0801 ?  docusate sodium (COLACE) capsule 100 mg, 100 mg, Oral, BID, Charlott Holler, MD, 100 mg at 04/02/21 1012 ?  feeding supplement (ENSURE ENLIVE / ENSURE PLUS) liquid 237 mL, 237 mL, Oral, TID AC & HS, Russella Dar, NP, 237 mL at 04/02/21 1018 ?  FLUoxetine (PROZAC) capsule 40 mg, 40 mg, Oral, Daily, Dahal, Binaya, MD, 40 mg at 04/02/21 1012 ?  folic acid (FOLVITE) tablet 1 mg, 1 mg, Oral, Daily, Vann, Jessica U, DO, 1 mg at 04/02/21 1012 ?  hydrocortisone cream 1 %, , Topical, QID, Russella Dar, NP, 1 application. at 04/02/21 1016 ?  levETIRAcetam (KEPPRA) tablet 500 mg, 500 mg, Oral, BID, Hammons, Kimberly B, RPH, 500 mg at 04/02/21 1012 ?  MEDLINE mouth rinse, 15 mL, Mouth Rinse, BID, Olalere,  Adewale A, MD, 15 mL at 04/02/21 1016 ?  melatonin tablet 10 mg, 10 mg, Oral, QHS, Ghimire, Shanker M, MD, 10 mg at 04/01/21 2123 ?  nicotine (NICODERM CQ - dosed in mg/24 hours) patch 21 mg, 21 mg, Transde

## 2021-04-02 NOTE — Progress Notes (Signed)
?PROGRESS NOTE ? ? ? ?Steve [name]  AOZ:[id number] DOB: 05-23-1965 DOA: 01/05/2021 ?PCP: No primary care provider on file.  ? ? ?Brief Narrative:  ?56 year old gentleman with history of major depression anxiety and alcohol use disorder who was brought to emergency room from residential alcohol treatment center where he was staying for 5 months for combativeness and altered sensorium.  He remains in the hospital since then with his behavioral issues.  Initial presentation to the emergency room, he was confused and combative.  Had evidence of acute kidney injury with creatinine 3.27, WBC count 23.7 and lactic acid 7.6.  CT head was negative.  Infection work-up was negative.  Initially plan for hemodialysis, however did not require dialysis.  He was ultimately stabilized and transferred to medical floor.  He remains in the hospital with not able to find placement.  Actively followed by psychiatry. ? ? ?Assessment & Plan: ?  ?Acute renal failure, resolved.  Normalized. ?Severe sepsis present on admission, resolved.  Normalized. ? 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 ?Initial MRI revealed tiny acute infarct on the right but not significant enough to explain persistent cognitive and behavioral abnormalities. ?Patient on multiple antipsychotic medications and actively followed by neurology.  Currently remains on ?Klonopin 0.5 mg twice daily, fluoxetine 40 mg daily, Keppra 500 mg twice daily, melatonin 10 mg at night, olanzapine 5 mg in the morning, 10 mg at night, risperidone 1 mg at night, Depakote 500 mg twice daily, trazodone 100 mg at night.  Remains on thiamine and folic acid. ?Continue all-time fall precautions.  Continue safety precautions, observation. ? ?Possible DVT: ?Patient is on Xarelto.  No documented DVT.  Critical care indicated that when they removed hemodialysis catheter he had a clot on it.  Remains on Xarelto since  then. ?Catheter associated blood clot, catheter removed.   ?Patient does not need full dose anticoagulation, will keep on DVT prophylaxis dose of Xarelto. ? ?Discussed with multidisciplinary team.  Patient with cognitive deficits, unsafe discharge.  He does have underlying psychiatric diagnosis.  Patient will need close observation and placement.  Currently not IVC. ?He can be redirected and behavior controlled with Zyprexa. ?If patient attempts to leave, will try to redirect him to keep him in the hospital as much as possible.  If defiant, will sign IVC at that time. ? ? ?DVT prophylaxis: rivaroxaban (XARELTO) tablet 10 mg Start: 02/27/21 1000 ?rivaroxaban (XARELTO) tablet 10 mg  ? ?Code Status: Full code ?Family Communication: None ?Disposition Plan: Status is: Inpatient ? ?Remains inpatient appropriate because: No safe disposition plans. ? ? ?  ? ? ?Consultants:  ?Multiple.  Currently actively followed by psychiatry. ? ?Procedures:  ?Multiple procedures.  Currently none. ? ?Antimicrobials:  ?Completed oral antibiotics. ? ? ?Subjective: ? ?Patient seen and examined.  Walking in the hallway.  Wanted to get out of the exit but when convinced to stay, he was able to be reoriented.  He tells me that different doctors tell him different things and he needs to record all of [place].  No other overnight events. ? ?Objective: ?Vitals:  ? 04/01/21 2011 04/01/21 2344 04/02/21 0350 04/02/21 0817  ?BP: 108/86 111/72 102/66 113/83  ?Pulse: 100 93 82 (!) 104  ?Resp: 14 18 16 20   ?Temp: 98.3 ?F (36.8 ?C) 97.8 ?F (36.6 ?C) 97.8 ?F (36.6 ?C) 98 ?F (36.7 ?C)  ?TempSrc: Oral Oral Oral Axillary  ?SpO2: 98% 98% 97% 100%  ?Weight:      ?Height:      ? ? ?  Intake/Output Summary (Last 24 hours) at 04/02/2021 1037 ?Last data filed at 04/02/2021 0815 ?Gross per 24 hour  ?Intake 1530 ml  ?Output --  ?Net 1530 ml  ? ? ?Filed Weights  ? 02/27/21 0501 03/05/21 0500 03/06/21 0500  ?Weight: 60.7 kg 57.9 kg 64.3 kg  ? ? ?Examination: ? ?Walking in the  hallway with his bedside sitter.  Looks comfortable.  On room air.  Slightly impulsive. ? ? ? ?Data Reviewed: I have personally reviewed following labs and imaging studies ? ?CBC: ?Recent Labs  ?Lab 03/27/21 ?0148  ?WBC 4.8  ?NEUTROABS 1.6*  ?HGB 11.0*  ?HCT 32.4*  ?MCV 96.4  ?PLT 257  ? ? ?Basic Metabolic Panel: ?Recent Labs  ?Lab 03/27/21 ?0148 04/02/21 ?0412  ?NA 136 139  ?K 3.9 3.7  ?CL 103 105  ?CO2 27 26  ?GLUCOSE 105* 97  ?BUN 20 17  ?CREATININE 0.61 0.71  ?CALCIUM 9.1 8.8*  ? ? ?GFR: ?Estimated Creatinine Clearance: 94.9 mL/min (by C-G formula based on SCr of 0.71 mg/dL). ?Liver Function Tests: ?Recent Labs  ?Lab 03/27/21 ?0148 04/02/21 ?0412  ?AST 60* 32  ?ALT 97* 53*  ?ALKPHOS 39 36*  ?BILITOT 0.3 0.2*  ?PROT 6.3* 6.1*  ?ALBUMIN 3.2* 3.2*  ? ? ?No results for input(s): LIPASE, AMYLASE in the last 168 hours. ?No results for input(s): AMMONIA in the last 168 hours. ?Coagulation Profile: ?No results for input(s): INR, PROTIME in the last 168 hours. ?Cardiac Enzymes: ?No results for input(s): CKTOTAL, CKMB, CKMBINDEX, TROPONINI in the last 168 hours. ?BNP (last 3 results) ?No results for input(s): PROBNP in the last 8760 hours. ?HbA1C: ?No results for input(s): HGBA1C in the last 72 hours. ?CBG: ?No results for input(s): GLUCAP in the last 168 hours. ?Lipid Profile: ?No results for input(s): CHOL, HDL, LDLCALC, TRIG, CHOLHDL, LDLDIRECT in the last 72 hours. ?Thyroid Function Tests: ?No results for input(s): TSH, T4TOTAL, FREET4, T3FREE, THYROIDAB in the last 72 hours. ?Anemia Panel: ?No results for input(s): VITAMINB12, FOLATE, FERRITIN, TIBC, IRON, RETICCTPCT in the last 72 hours. ?Sepsis Labs: ?No results for input(s): PROCALCITON, LATICACIDVEN in the last 168 hours. ? ?No results found for this or any previous visit (from the past 240 hour(s)).  ? ? ? ? ? ?Radiology Studies: ?No results found. ? ? ? ? ? ?Scheduled Meds: ? aspirin  325 mg Oral Daily  ? clonazePAM  0.5 mg Oral 2 times per day  ? docusate  sodium  100 mg Oral BID  ? feeding supplement  237 mL Oral TID AC & HS  ? FLUoxetine  40 mg Oral Daily  ? folic acid  1 mg Oral Daily  ? hydrocortisone cream   Topical QID  ? levETIRAcetam  500 mg Oral BID  ? mouth rinse  15 mL Mouth Rinse BID  ? melatonin  10 mg Oral QHS  ? nicotine  21 mg Transdermal Daily  ? OLANZapine  10 mg Oral QHS  ? polyethylene glycol  17 g Oral Daily  ? risperiDONE  1 mg Oral QHS  ? rivaroxaban  10 mg Oral Daily  ? selenium sulfide   Topical Daily  ? thiamine  250 mg Oral Daily  ? traZODone  100 mg Oral QHS  ? valproic acid  500 mg Oral BID  ? ?Continuous Infusions: ? ? LOS: 87 days  ? ? ?Time spent: 25 minutes ? ? ? ?Dorcas Carrow, MD ?[facility] ?Pager [phone]  ?

## 2021-04-02 NOTE — Plan of Care (Signed)
  Problem: Activity: Goal: Risk for activity intolerance will decrease Outcome: Progressing   Problem: Coping: Goal: Level of anxiety will decrease Outcome: Progressing   Problem: Safety: Goal: Ability to remain free from injury will improve Outcome: Progressing   

## 2021-04-02 NOTE — Plan of Care (Signed)
?  Problem: Clinical Measurements: ?Goal: Ability to maintain clinical measurements within normal limits will improve ?Outcome: Progressing ?Goal: Will remain free from infection ?Outcome: Progressing ?Goal: Diagnostic test results will improve ?Outcome: Progressing ?Goal: Respiratory complications will improve ?Outcome: Progressing ?Goal: Cardiovascular complication will be avoided ?Outcome: Progressing ?  ?Problem: Activity: ?Goal: Risk for activity intolerance will decrease ?Outcome: Progressing ?  ?Problem: Nutrition: ?Goal: Adequate nutrition will be maintained ?Outcome: Progressing ?  ?Problem: Elimination: ?Goal: Will not experience complications related to bowel motility ?Outcome: Progressing ?Goal: Will not experience complications related to urinary retention ?Outcome: Progressing ?  ?Problem: Pain Managment: ?Goal: General experience of comfort will improve ?Outcome: Progressing ?  ?Problem: Safety: ?Goal: Ability to remain free from injury will improve ?Outcome: Progressing ?  ?Problem: Skin Integrity: ?Goal: Risk for impaired skin integrity will decrease ?Outcome: Progressing ?  ?Problem: Education: ?Goal: Expressions of having a comfortable level of knowledge regarding the disease process will increase ?Outcome: Progressing ?  ?Problem: Coping: ?Goal: Ability to identify appropriate support needs will improve ?Outcome: Progressing ?  ?Problem: Health Behavior/Discharge Planning: ?Goal: Compliance with prescribed medication regimen will improve ?Outcome: Progressing ?  ?Problem: Medication: ?Goal: Risk for medication side effects will decrease ?Outcome: Progressing ?  ?Problem: Clinical Measurements: ?Goal: Complications related to the disease process, condition or treatment will be avoided or minimized ?Outcome: Progressing ?Goal: Diagnostic test results will improve ?Outcome: Progressing ?  ?Problem: Self-Concept: ?Goal: Ability to verbalize feelings about condition will improve ?Outcome: Progressing ?   ?

## 2021-04-03 DIAGNOSIS — E512 Wernicke's encephalopathy: Secondary | ICD-10-CM | POA: Diagnosis not present

## 2021-04-03 MED ORDER — OLANZAPINE 10 MG IM SOLR
5.0000 mg | Freq: Four times a day (QID) | INTRAMUSCULAR | Status: DC | PRN
  Administered 2021-04-04 – 2021-04-06 (×2): 5 mg via INTRAMUSCULAR
  Filled 2021-04-03 (×3): qty 10

## 2021-04-03 MED ORDER — OLANZAPINE 2.5 MG PO TABS
5.0000 mg | ORAL_TABLET | Freq: Four times a day (QID) | ORAL | Status: DC | PRN
Start: 2021-04-03 — End: 2021-04-20
  Administered 2021-04-03 – 2021-04-18 (×9): 5 mg via ORAL
  Filled 2021-04-03 (×12): qty 2

## 2021-04-03 NOTE — Progress Notes (Signed)
?PROGRESS NOTE ? ? ? ?Steve [name]  [id number] DOB: Dec 09, 1965 DOA: 01/05/2021 ?PCP: No primary care provider on file.  ? ? ?Brief Narrative:  ?56 year old gentleman with history of major depression anxiety and alcohol use disorder who was brought to emergency room from residential alcohol treatment center where he was staying for 5 months for combativeness and altered sensorium.  He remains in the hospital since then with his behavioral issues.  Initial presentation to the emergency room, he was confused and combative.  Had evidence of acute kidney injury with creatinine 3.27, WBC count 23.7 and lactic acid 7.6.  CT head was negative.  Infection work-up was negative.  Initially plan for hemodialysis, however did not require dialysis.  He was ultimately stabilized and transferred to medical floor.  He remains in the hospital with not able to find placement.  Actively followed by psychiatry. ? ? ?Assessment & Plan: ?  ?Acute renal failure, resolved.  Normalized. ?Severe sepsis present on admission, resolved.  Normalized. ? 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 ?Initial MRI revealed tiny acute infarct on the right but not significant enough to explain persistent cognitive and behavioral abnormalities. ?Patient on multiple antipsychotic medications and actively followed by neurology.  Currently remains on ?Klonopin 0.5 mg twice daily, fluoxetine 40 mg daily, Keppra 500 mg twice daily, melatonin 10 mg at night, olanzapine 5 mg in the morning, 10 mg at night, risperidone 1 mg at night, Depakote 500 mg twice daily, trazodone 100 mg at night.  Remains on thiamine and folic acid. ?Continue all-time fall precautions.  Continue safety precautions, observation. ? ?Possible DVT: ?Patient is on Xarelto.  No documented DVT.  Critical care indicated that when they removed hemodialysis catheter he had a clot on it.  Remains on Xarelto since  then. ?Catheter associated blood clot, catheter removed.   ?Patient does not need full dose anticoagulation, will keep on DVT prophylaxis dose of Xarelto. ? ?Discussed with multidisciplinary team.  Patient with cognitive deficits, unsafe discharge.  He does have underlying psychiatric diagnosis.  Patient will need close observation and placement.  Currently not IVC. ?He can be redirected and behavior controlled with Zyprexa. ?If patient attempts to leave, will try to redirect him to keep him in the hospital as much as possible.  If defiant, will sign IVC at that time. ? ? ?DVT prophylaxis: rivaroxaban (XARELTO) tablet 10 mg Start: 02/27/21 1000 ?rivaroxaban (XARELTO) tablet 10 mg  ? ?Code Status: Full code ?Family Communication: None ?Disposition Plan: Status is: Inpatient ? ?Remains inpatient appropriate because: No safe disposition plans. ? ? ?  ? ? ?Consultants:  ?Multiple.  Currently actively followed by psychiatry. ? ?Procedures:  ?Multiple procedures.  Currently none. ? ?Antimicrobials:  ?Completed oral antibiotics. ? ? ?Subjective: ? ?Seen and examined.  Eating breakfast.  Denies any complaints.  Foot pain is gone. ? ?Objective: ?Vitals:  ? 04/02/21 1943 04/02/21 2306 04/03/21 0400 04/03/21 0908  ?BP: 102/76 110/66 104/70 110/67  ?Pulse: 94 96 90 (!) 57  ?Resp: 18 20 16 18   ?Temp: 98.7 ?F (37.1 ?C) 98.1 ?F (36.7 ?C) 98.2 ?F (36.8 ?C) (!) 97.2 ?F (36.2 ?C)  ?TempSrc:  Oral Oral Oral  ?SpO2: 97% 98% 98% 98%  ?Weight:      ?Height:      ? ? ?Intake/Output Summary (Last 24 hours) at 04/03/2021 1108 ?Last data filed at 04/02/2021 1734 ?Gross per 24 hour  ?Intake 620 ml  ?Output --  ?Net 620 ml  ? ? ?  Filed Weights  ? 02/27/21 0501 03/05/21 0500 03/06/21 0500  ?Weight: 60.7 kg 57.9 kg 64.3 kg  ? ? ?Examination: ? ?Comfortably sitting, pleasant but confused.  Eating breakfast. ? ? ? ?Data Reviewed: I have personally reviewed following labs and imaging studies ? ?CBC: ?No results for input(s): WBC, NEUTROABS, HGB, HCT,  MCV, PLT in the last 168 hours. ? ?Basic Metabolic Panel: ?Recent Labs  ?Lab 04/02/21 ?0412  ?NA 139  ?K 3.7  ?CL 105  ?CO2 26  ?GLUCOSE 97  ?BUN 17  ?CREATININE 0.71  ?CALCIUM 8.8*  ? ? ?GFR: ?Estimated Creatinine Clearance: 94.9 mL/min (by C-G formula based on SCr of 0.71 mg/dL). ?Liver Function Tests: ?Recent Labs  ?Lab 04/02/21 ?0412  ?AST 32  ?ALT 53*  ?ALKPHOS 36*  ?BILITOT 0.2*  ?PROT 6.1*  ?ALBUMIN 3.2*  ? ? ?No results for input(s): LIPASE, AMYLASE in the last 168 hours. ?No results for input(s): AMMONIA in the last 168 hours. ?Coagulation Profile: ?No results for input(s): INR, PROTIME in the last 168 hours. ?Cardiac Enzymes: ?No results for input(s): CKTOTAL, CKMB, CKMBINDEX, TROPONINI in the last 168 hours. ?BNP (last 3 results) ?No results for input(s): PROBNP in the last 8760 hours. ?HbA1C: ?No results for input(s): HGBA1C in the last 72 hours. ?CBG: ?No results for input(s): GLUCAP in the last 168 hours. ?Lipid Profile: ?No results for input(s): CHOL, HDL, LDLCALC, TRIG, CHOLHDL, LDLDIRECT in the last 72 hours. ?Thyroid Function Tests: ?No results for input(s): TSH, T4TOTAL, FREET4, T3FREE, THYROIDAB in the last 72 hours. ?Anemia Panel: ?No results for input(s): VITAMINB12, FOLATE, FERRITIN, TIBC, IRON, RETICCTPCT in the last 72 hours. ?Sepsis Labs: ?No results for input(s): PROCALCITON, LATICACIDVEN in the last 168 hours. ? ?No results found for this or any previous visit (from the past 240 hour(s)).  ? ? ? ? ? ?Radiology Studies: ?No results found. ? ? ? ? ? ?Scheduled Meds: ? aspirin  325 mg Oral Daily  ? clonazePAM  0.5 mg Oral 2 times per day  ? docusate sodium  100 mg Oral BID  ? feeding supplement  237 mL Oral TID AC & HS  ? FLUoxetine  40 mg Oral Daily  ? folic acid  1 mg Oral Daily  ? hydrocortisone cream   Topical QID  ? levETIRAcetam  500 mg Oral BID  ? mouth rinse  15 mL Mouth Rinse BID  ? melatonin  10 mg Oral QHS  ? nicotine  21 mg Transdermal Daily  ? OLANZapine  10 mg Oral QHS  ?  polyethylene glycol  17 g Oral Daily  ? risperiDONE  1 mg Oral QHS  ? rivaroxaban  10 mg Oral Daily  ? selenium sulfide   Topical Daily  ? thiamine  250 mg Oral Daily  ? traZODone  100 mg Oral QHS  ? valproic acid  500 mg Oral BID  ? ?Continuous Infusions: ? ? LOS: 88 days  ? ? ?Time spent: 25 minutes ? ? ? ?Barb Merino, MD ?[facility] ?Pager [phone]  ?

## 2021-04-03 NOTE — Plan of Care (Signed)
?  Problem: Health Behavior/Discharge Planning: ?Goal: Ability to manage health-related needs will improve ?Outcome: Progressing ?  ?Problem: Clinical Measurements: ?Goal: Ability to maintain clinical measurements within normal limits will improve ?Outcome: Progressing ?Goal: Will remain free from infection ?Outcome: Progressing ?Goal: Diagnostic test results will improve ?Outcome: Progressing ?Goal: Respiratory complications will improve ?Outcome: Progressing ?Goal: Cardiovascular complication will be avoided ?Outcome: Progressing ?  ?Problem: Activity: ?Goal: Risk for activity intolerance will decrease ?Outcome: Progressing ?  ?Problem: Nutrition: ?Goal: Adequate nutrition will be maintained ?Outcome: Progressing ?  ?Problem: Elimination: ?Goal: Will not experience complications related to bowel motility ?Outcome: Progressing ?Goal: Will not experience complications related to urinary retention ?Outcome: Progressing ?  ?Problem: Pain Managment: ?Goal: General experience of comfort will improve ?Outcome: Progressing ?  ?Problem: Safety: ?Goal: Ability to remain free from injury will improve ?Outcome: Progressing ?  ?Problem: Skin Integrity: ?Goal: Risk for impaired skin integrity will decrease ?Outcome: Progressing ?  ?Problem: Coping: ?Goal: Ability to identify appropriate support needs will improve ?Outcome: Progressing ?  ?Problem: Health Behavior/Discharge Planning: ?Goal: Compliance with prescribed medication regimen will improve ?Outcome: Progressing ?  ?Problem: Medication: ?Goal: Risk for medication side effects will decrease ?Outcome: Progressing ?  ?Problem: Clinical Measurements: ?Goal: Complications related to the disease process, condition or treatment will be avoided or minimized ?Outcome: Progressing ?Goal: Diagnostic test results will improve ?Outcome: Progressing ?  ?Problem: Self-Concept: ?Goal: Ability to verbalize feelings about condition will improve ?Outcome: Progressing ?  ?

## 2021-04-04 DIAGNOSIS — E512 Wernicke's encephalopathy: Secondary | ICD-10-CM | POA: Diagnosis not present

## 2021-04-04 NOTE — Progress Notes (Signed)
?PROGRESS NOTE ? ? ? ?Steve [name]  OJJ:[id number] DOB: 07-Jan-1966 DOA: 01/05/2021 ?PCP: No primary care provider on file.  ? ? ?Brief Narrative:  ?56 year old gentleman with history of major depression anxiety and alcohol use disorder who was brought to emergency room from residential alcohol treatment center where he was staying for 5 months for combativeness and altered sensorium.  He remains in the hospital since then with his behavioral issues.  Initial presentation to the emergency room, he was confused and combative.  Had evidence of acute kidney injury with creatinine 3.27, WBC count 23.7 and lactic acid 7.6.  CT head was negative.  Infection work-up was negative.  Initially plan for hemodialysis, however did not require dialysis.  He was ultimately stabilized and transferred to medical floor.  He remains in the hospital with not able to find placement.  Actively followed by psychiatry. ? ? ?Assessment & Plan: ?  ?Acute renal failure, resolved.  Normalized. ?Severe sepsis present on admission, resolved.  Normalized. ? 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 ?Initial MRI revealed tiny acute infarct on the right but not significant enough to explain persistent cognitive and behavioral abnormalities. ?Patient on multiple antipsychotic medications and actively followed by neurology.  Currently remains on ?Klonopin 0.5 mg twice daily, fluoxetine 40 mg daily, Keppra 500 mg twice daily, melatonin 10 mg at night, olanzapine 5 mg in the morning, 10 mg at night, risperidone 1 mg at night, Depakote 500 mg twice daily, trazodone 100 mg at night.  Remains on thiamine and folic acid. ?Continue all-time fall precautions.  Continue safety precautions, observation. ? ?Possible DVT: ?Patient is on Xarelto.  No documented DVT.  Critical care indicated that when they removed hemodialysis catheter he had a clot on it.  Remains on Xarelto since  then. ?Catheter associated blood clot, catheter removed.   ?Patient does not need full dose anticoagulation, will keep on DVT prophylaxis dose of Xarelto. ? ?Discussed with multidisciplinary team.  Patient with cognitive deficits, unsafe discharge.  He does have underlying psychiatric diagnosis.  Patient will need close observation and placement.  Currently not IVC. ?He can be redirected and behavior controlled with Zyprexa. ?If patient attempts to leave, will try to redirect him to keep him in the hospital as much as possible.  If defiant, will sign IVC at that time. ? ? ?DVT prophylaxis: rivaroxaban (XARELTO) tablet 10 mg Start: 02/27/21 1000 ?rivaroxaban (XARELTO) tablet 10 mg  ? ?Code Status: Full code ?Family Communication: None ?Disposition Plan: Status is: Inpatient ? ?Remains inpatient appropriate because: No safe disposition plans. ? ? ?  ? ? ?Consultants:  ?Multiple.  Currently actively followed by psychiatry. ? ?Procedures:  ?Multiple procedures.  Currently none. ? ?Antimicrobials:  ?Completed oral antibiotics. ? ? ?Subjective: ? ?Intermittent confusion and attempting to leave but can be reoriented and counseled. ?Just not happy to be in the hospital. ? ?Objective: ?Vitals:  ? 04/03/21 1308 04/03/21 2057 04/04/21 0024 04/04/21 0334  ?BP: 105/78 104/68 (!) 129/113 104/80  ?Pulse: 97 82 86 86  ?Resp: 20 16    ?Temp: 98.5 ?F (36.9 ?C) 97.9 ?F (36.6 ?C) 98.1 ?F (36.7 ?C) 98 ?F (36.7 ?C)  ?TempSrc: Oral Oral Oral Oral  ?SpO2: 98% 98% 96% 94%  ?Weight:      ?Height:      ? ?No intake or output data in the 24 hours ending 04/04/21 1112 ? ?Filed Weights  ? 02/27/21 0501 03/05/21 0500 03/06/21 0500  ?Weight: 60.7 kg  57.9 kg 64.3 kg  ? ? ?Examination: ? ?Comfortably sitting, pleasant but confused.  ?Sitting in bed, only wearing pants. ? ? ? ?Data Reviewed: I have personally reviewed following labs and imaging studies ? ?CBC: ?No results for input(s): WBC, NEUTROABS, HGB, HCT, MCV, PLT in the last 168  hours. ? ?Basic Metabolic Panel: ?Recent Labs  ?Lab 04/02/21 ?0412  ?NA 139  ?K 3.7  ?CL 105  ?CO2 26  ?GLUCOSE 97  ?BUN 17  ?CREATININE 0.71  ?CALCIUM 8.8*  ? ? ?GFR: ?Estimated Creatinine Clearance: 94.9 mL/min (by C-G formula based on SCr of 0.71 mg/dL). ?Liver Function Tests: ?Recent Labs  ?Lab 04/02/21 ?0412  ?AST 32  ?ALT 53*  ?ALKPHOS 36*  ?BILITOT 0.2*  ?PROT 6.1*  ?ALBUMIN 3.2*  ? ? ?No results for input(s): LIPASE, AMYLASE in the last 168 hours. ?No results for input(s): AMMONIA in the last 168 hours. ?Coagulation Profile: ?No results for input(s): INR, PROTIME in the last 168 hours. ?Cardiac Enzymes: ?No results for input(s): CKTOTAL, CKMB, CKMBINDEX, TROPONINI in the last 168 hours. ?BNP (last 3 results) ?No results for input(s): PROBNP in the last 8760 hours. ?HbA1C: ?No results for input(s): HGBA1C in the last 72 hours. ?CBG: ?No results for input(s): GLUCAP in the last 168 hours. ?Lipid Profile: ?No results for input(s): CHOL, HDL, LDLCALC, TRIG, CHOLHDL, LDLDIRECT in the last 72 hours. ?Thyroid Function Tests: ?No results for input(s): TSH, T4TOTAL, FREET4, T3FREE, THYROIDAB in the last 72 hours. ?Anemia Panel: ?No results for input(s): VITAMINB12, FOLATE, FERRITIN, TIBC, IRON, RETICCTPCT in the last 72 hours. ?Sepsis Labs: ?No results for input(s): PROCALCITON, LATICACIDVEN in the last 168 hours. ? ?No results found for this or any previous visit (from the past 240 hour(s)).  ? ? ? ? ? ?Radiology Studies: ?No results found. ? ? ? ? ? ?Scheduled Meds: ? aspirin  325 mg Oral Daily  ? clonazePAM  0.5 mg Oral 2 times per day  ? docusate sodium  100 mg Oral BID  ? feeding supplement  237 mL Oral TID AC & HS  ? FLUoxetine  40 mg Oral Daily  ? folic acid  1 mg Oral Daily  ? levETIRAcetam  500 mg Oral BID  ? mouth rinse  15 mL Mouth Rinse BID  ? melatonin  10 mg Oral QHS  ? nicotine  21 mg Transdermal Daily  ? OLANZapine  10 mg Oral QHS  ? polyethylene glycol  17 g Oral Daily  ? risperiDONE  1 mg Oral QHS   ? rivaroxaban  10 mg Oral Daily  ? thiamine  250 mg Oral Daily  ? traZODone  100 mg Oral QHS  ? valproic acid  500 mg Oral BID  ? ?Continuous Infusions: ? ? LOS: 89 days  ? ? ?Time spent: 25 minutes ? ? ? ?Dorcas Carrow, MD ?[facility] ?Pager [phone]  ?

## 2021-04-05 DIAGNOSIS — E512 Wernicke's encephalopathy: Secondary | ICD-10-CM | POA: Diagnosis not present

## 2021-04-05 MED ORDER — POLYVINYL ALCOHOL 1.4 % OP SOLN
1.0000 [drp] | OPHTHALMIC | Status: DC | PRN
  Administered 2021-04-06 (×2): 1 [drp] via OPHTHALMIC
  Filled 2021-04-05: qty 15

## 2021-04-05 NOTE — Plan of Care (Signed)
?  Problem: Health Behavior/Discharge Planning: ?Goal: Ability to manage health-related needs will improve ?Outcome: Progressing ?  ?Problem: Clinical Measurements: ?Goal: Ability to maintain clinical measurements within normal limits will improve ?Outcome: Progressing ?Goal: Will remain free from infection ?Outcome: Progressing ?Goal: Diagnostic test results will improve ?Outcome: Progressing ?Goal: Respiratory complications will improve ?Outcome: Progressing ?Goal: Cardiovascular complication will be avoided ?Outcome: Progressing ?  ?Problem: Activity: ?Goal: Risk for activity intolerance will decrease ?Outcome: Progressing ?  ?Problem: Nutrition: ?Goal: Adequate nutrition will be maintained ?Outcome: Progressing ?  ?Problem: Coping: ?Goal: Level of anxiety will decrease ?Outcome: Progressing ?  ?Problem: Elimination: ?Goal: Will not experience complications related to bowel motility ?Outcome: Progressing ?Goal: Will not experience complications related to urinary retention ?Outcome: Progressing ?  ?Problem: Pain Managment: ?Goal: General experience of comfort will improve ?Outcome: Progressing ?  ?Problem: Safety: ?Goal: Ability to remain free from injury will improve ?Outcome: Progressing ?  ?Problem: Skin Integrity: ?Goal: Risk for impaired skin integrity will decrease ?Outcome: Progressing ?  ?Problem: Education: ?Goal: Expressions of having a comfortable level of knowledge regarding the disease process will increase ?Outcome: Progressing ?  ?Problem: Coping: ?Goal: Ability to adjust to condition or change in health will improve ?Outcome: Progressing ?Goal: Ability to identify appropriate support needs will improve ?Outcome: Progressing ?  ?Problem: Health Behavior/Discharge Planning: ?Goal: Compliance with prescribed medication regimen will improve ?Outcome: Progressing ?  ?Problem: Medication: ?Goal: Risk for medication side effects will decrease ?Outcome: Progressing ?  ?Problem: Clinical  Measurements: ?Goal: Complications related to the disease process, condition or treatment will be avoided or minimized ?Outcome: Progressing ?Goal: Diagnostic test results will improve ?Outcome: Progressing ?  ?Problem: Safety: ?Goal: Verbalization of understanding the information provided will improve ?Outcome: Progressing ?  ?Problem: Self-Concept: ?Goal: Level of anxiety will decrease ?Outcome: Progressing ?Goal: Ability to verbalize feelings about condition will improve ?Outcome: Progressing ?  ?

## 2021-04-05 NOTE — Progress Notes (Signed)
?Progress Note ? ? ?PatientCreed [name] BCW:[id number] DOB: [birth year] DOA: 01/05/2021     90 ?DOS: the patient was seen and examined on 04/05/2021 ?  ?Brief hospital course: ?Steve [name] is a 56 y.o. male with history of major depression, anxiety who was in a residential ETOH treatment center for last 5 months. He was brought to the ED on 12/20 from a residential ETOH treatment center for convalescence, combativeness and altered sensorium.  Per collateral history, patient was not acting typical for last few days. ? ?In the ED, patient was confused, combative. Labs showed AKI with creatinine of 3.27, rhabdomyolysis, abnormal LFTs, elevated WBC count at 23.7 and lactic acid level at 7.6. 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 Blood culture was sent, empiric antibiotic started.  IV fluid resuscitation started and patient was initially admitted to ICU. ?12/21, underwent LP which showed staph in CSF, felt to be contaminant.  EEG did not show any seizures ?12/24, HD catheter was placed but patient did not require HD as creatinine started to improve ?12/31, with clinical improvement, patient was transferred out of ICU to [facility]. ?1/18 Cognitive evaluations: 2 on the SLUMS, a 9 on the MMSE, and a 0 on medi-cog and short blessed test ? ?Assessment and Plan: ?Left  LE pain ?Recurrent ?XR neg ?Plan is to use simple ankle support for ambulation ? ?* Cognitive impairment  2/2 Wernicke's encephalopathy and alcoholic dementia ?Initial MRI revealed tiny acute infarct on the right but not significant enough to explain persistent cognitive and behavioral abnormalities. Also evidence of prior lacunar infarct right basal ganglia ?Cognitive screening revealed significant cognitive deficits ?Continue Prozac, Depakote, Klonopin and HS melatonin ?Appreciate psychiatric team assistance.  Patient has responded very  well to down taper of Zyprexa and up taper of Risperdal.  Although he remains slightly impulsive he is easily redirectable.  Continues to confabulate.  Has done well without sitter or Posey safety belt restraint for greater than 72 hours ?Continue thiamine-previously had been given high-dose IV thiamine earlier in the hospitalization with some improvement in cognition ? ?PLEASE NOTE THAT THIS PT'S REGIMEN OF PSYCHOTROPIC MEDS CAN NEVER BE WEANED AND DC'D DUE TO SEVERITY OF HIS WERNICKE'S DEMENTIA ? ? ? ? ?Witnessed seizure ([name]) ?No apparent prior history of seizure disorder noting patient was not on AEDs prior to admission ?Unclear if withdrawal seizure or related to other substances ?3/10 given recent behavioral changes attending felt EEG needed to be completed to rule out atypical seizure activity causing behavioral manifestations.  EEG unremarkable other than for moderate diffuse encephalopathy.  No seizure activity. ?Continue Keppra at recommendation of neurology ? ?Acute kidney injury with rhabdomyolysis-resolved as of 03/17/2021 ?Resolved ?Creatinine peaked at 7.48 on 12/26.  Nephrology consulted with initial plans to initiate dialysis including placement of [facility] but renal function gradually improved and dialysis not needed and TDC removed ? ?Eczema ?Extremity lesions have resolved but has new areas on chest abdomen flank and back.  Will reorder cortisone to be applied to these areas ?  ?2/27 ? ? ?2/27 ? ? ?3/5 ? ? ? ? ?Constipation ?Patient has not had bowel movement since 3/9 according to the flow record ?Currently he is on scheduled Colace twice daily and daily MiraLAX ?Will give a dose of milk of magnesia today 3/13 ? ?Aspiration pneumonia ([name])-resolved as of 03/04/2021 ?Completed a course of Zosyn ? ?Possible DVT (deep venous thrombosis) ([name])-resolved  as of 03/17/2021 ?Per critical care note, hemodialysis cath had a clot on it when it was removed.  UE duplex  12/31: no DVT.   ? ?Right ventricular  dysfunction ?Echo 1/2 with EF 60 to 65%.  Cannot rule out a small PFO this finding determined to be clinically insignificant given the patient was asymptomatic ? ?Elevated transaminases at time of presentation-resolved as of 03/04/2021 ?Likely secondary to sepsis like physiology at presentation ?His AST and ALT were significantly elevated to over 700s on admission.  ?Re emergence mild elevation LFTs-likely due to Zyprexa and Depakote ?Due to oversedation Zyprexa dose decreased ?LFTs have normalized with decrease in Zyprexa dose. ?2/16 valproic acid level 55 ? ? ?Physical deconditioning 2/2 ambulatory dysfunction ?Influenced by patient's poor cognition as well as prior heavy alcohol abuse and malnutrition before admission ?Therapy continues to recommend SNF placement due to level of assist required with ADLs and IADLs as well as ongoing poor cognition ?Mobility continues to be influenced not only by cognition but by ambulatory dysfunction/gait and imbalance issues ?Current goal is to improve mobility to the point where patient can get up independently without being at risk for falls therefore Posey belt restraint can be discontinued ? ?A physical therapy consult is indicated based on the patient?s mobility assessment. ? ? ?Mobility Assessment (last 72 hours)   ? ? Mobility Assessment   ? ? Row Name 03/15/21 1700 03/15/21 1200 03/14/21 1200 03/14/21 0952 03/14/21 0830  ? Does patient have an order for bedrest or is patient medically unstable -- No - Continue assessment -- No - Continue assessment No - Continue assessment  ? What is the highest level of mobility based on the progressive mobility assessment? Level 4 (Walks with assist in room) - Balance while marching in place and cannot step forward and back - Complete Level 4 (Walks with assist in room) - Balance while marching in place and cannot step forward and back - Complete -- Level 4 (Walks with assist in room) - Balance while marching in place and cannot step  forward and back - Complete Level 4 (Walks with assist in room) - Balance while marching in place and cannot step forward and back - Complete  ? Is the above level different from baseline mobility prior to current illness? -- Yes - Recommend PT order -- Yes - Recommend PT order Yes - Recommend PT order  ? ? Row Name 03/13/21 2034 03/13/21 0800  ?  ?  ?  ? Does patient have an order for bedrest or is patient medically unstable No - Continue assessment No - Continue assessment     ? What is the highest level of mobility based on the progressive mobility assessment? Level 4 (Walks with assist in room) - Balance while marching in place and cannot step forward and back - Complete Level 2 (Chairfast) - Balance while sitting on edge of bed and cannot stand     ? Is the above level different from baseline mobility prior to current illness? Yes - Recommend PT order Yes - Recommend PT order     ? ?  ?  ? ?  ? ? ? ? ?Severe sepsis without septic shock ([name])-resolved as of 03/17/2021 ?Ruled out ?Sepsis-like physiology secondary to presentation with profound dehydration, hypoperfusion from dehydration related hypotension seizure activity ?Chest work-up was negative ? ?Skin abnormalities/perineal MASD ?Wound / Incision (Open or Dehisced) 03/09/21 (MASD) Moisture Associated Skin Damage Groin Right redness under scrotum and to the right Groin (Active)  ?  Date First Assessed/Time First Assessed: 03/09/21 0500   Wound Type: (MASD) Moisture Associated Skin Damage  Location: Groin  Location Orientation: Right  Wound Description (Comments): redness under scrotum and to the right Groin  Present on Admission...  ?  ?Assessments 03/09/2021  5:00 AM 03/18/2021  8:19 PM  ?Dressing Type None;Other (Comment) None  ?Dressing Changed Changed --  ?Dressing Status Clean, Dry, Intact --  ?Site / Wound Assessment Clean;Pink;Painful --  ?Peri-wound Assessment Intact --  ?Margins Attached edges (approximated) --  ?Non-staged Wound Description Not applicable  --  ?Treatment Cleansed;Other (Comment) --  ?   ?No Linked orders to display  ? ? ?Protein calorie malnutrition ([name]) ?Patient having difficulty eating independently and completing meal ?We will have OT evaluate

## 2021-04-05 NOTE — Progress Notes (Signed)
Occupational Therapy Treatment ?Patient Details ?Name: Steve [name] ?MRN: [id number] ?DOB: [birth year] ?Today's Date: 04/05/2021 ? ? ?History of present illness Pt is a 56 y.o. male admitted from group home for convalescence, combativeness and altered sensorium on 01/05/21 with witnessed seizure, AMS. 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 EEG unremarkable. LP on 12/21 showed staph in CSF, felt to be contaminant. Workup for AKI, rhabdomyolysis. Course complicated by c/o L foot pain; imaging negative for acute injury. Pt remains hospitalized due to difficult to place. Pt with psych consult who felt no need for inpt psych. No PMH in chart. ?  ?OT comments ? Pt very pleasant and cooperative today. Happy to be in shirt and shorts, agreeable to gait belt for in room and hallway mobility. Cognitive deficits in problem solving, awareness, safety, and orientation persist. However, pt enjoyed some jokes and laughed a lot today - very happy attitude. Able to complete standing toileting, grooming at sink with min A. Pt continued to walk with NT in hallway at end of session. Pt continues to benefit from skilled OT at acute level and requires SNF post-acute.   ? ?Recommendations for follow up therapy are one component of a multi-disciplinary discharge planning process, led by the attending physician.  Recommendations may be updated based on patient status, additional functional criteria and insurance authorization. ?   ?Follow Up Recommendations ? Skilled nursing-short term rehab (<3 hours/day)  ?  ?Assistance Recommended at Discharge Frequent or constant Supervision/Assistance  ?Patient can return home with the following ? A little help with walking and/or transfers;A little help with bathing/dressing/bathroom;Assistance with cooking/housework;Assist for transportation;Help with stairs or ramp for  entrance ?  ?Equipment Recommendations ? None recommended by OT  ?  ?Recommendations for Other Services   ? ?  ?Precautions / Restrictions Precautions ?Precautions: Fall ?Restrictions ?Weight Bearing Restrictions: No  ? ? ?  ? ?Mobility Bed Mobility ?  ?  ?  ?  ?  ?  ?  ?General bed mobility comments: Walking with NT intially ?  ? ?Transfers ?Overall transfer level: Needs assistance ?Equipment used: None ?Transfers: Sit to/from Stand ?Sit to Stand: Min guard ?  ?  ?  ?  ?  ?General transfer comment: able to power up to standing without assist, standing balance unsafe - ?  ?  ?Balance Overall balance assessment: Needs assistance ?Sitting-balance support: Feet supported, No upper extremity supported ?Sitting balance-Leahy Scale: Good ?  ?  ?Standing balance support: No upper extremity supported, During functional activity ?Standing balance-Leahy Scale: Fair ?  ?  ?  ?  ?  ?  ?  ?  ?  ?  ?  ?  ?   ? ?ADL either performed or assessed with clinical judgement  ? ?ADL Overall ADL's : Needs assistance/impaired ?  ?  ?Grooming: Oral care;Minimal assistance;Standing;Wash/dry hands ?Grooming Details (indicate cue type and reason): Pt using grooming tools appropriately today, min A for sequencing and task completion/thoroughness ?  ?  ?  ?  ?  ?  ?  ?  ?Toilet Transfer: Minimal assistance;Ambulation ?Toilet Transfer Details (indicate cue type and reason): gait belt ?Toileting- Clothing Manipulation and Hygiene: Minimal assistance;Sit to/from stand ?Toileting - Clothing Manipulation Details (indicate cue type and reason): standing to urinate in the bathroom, use of gait belt for safety and balance ?  ?  ?Functional mobility during ADLs: Minimal assistance (OT hands on gait  belt for safety with mobility) ?General ADL Comments: Pt continues to be highly distractable ?  ? ?Extremity/Trunk Assessment Upper Extremity Assessment ?Upper Extremity Assessment: Overall WFL for tasks assessed ?  ?  ?  ?  ?  ? ?Vision   ?  ?  ?Perception    ?  ?Praxis   ?  ? ?Cognition Arousal/Alertness: Awake/alert ?Behavior During Therapy: [facility] for tasks assessed/performed, Impulsive ?Overall Cognitive Status: Impaired/Different from baseline ?Area of Impairment: Orientation, Attention, Memory, Following commands, Safety/judgement, Awareness, Problem solving ?  ?  ?  ?  ?  ?  ?  ?  ?Orientation Level: Time, Situation ("in [place]") ?Current Attention Level: Selective ?Memory: Decreased recall of precautions, Decreased short-term memory ?Following Commands: Follows one step commands with increased time, Follows one step commands inconsistently ?Safety/Judgement: Decreased awareness of safety, Decreased awareness of deficits ?Awareness: Emergent ?Problem Solving: Requires verbal cues ?General Comments: Pt very pleasant and happy to work with therapy today. Impulsivity continues, as well as decreased safety awareness. Telling therapist he was "In [place]" but laughing at jokes and very pleasant ?  ?  ?   ?Exercises   ? ?  ?Shoulder Instructions   ? ? ?  ?General Comments Pt in full set of clothes, and much happier - thinks he is discharging today despite education  ? ? ?Pertinent Vitals/ Pain       Pain Assessment ?Pain Assessment: Faces ?Faces Pain Scale: Hurts little more ?Pain Location: L foot with mobility ?Pain Descriptors / Indicators: Grimacing, Guarding ?Pain Intervention(s): Monitored during session, Repositioned ? ?Home Living   ?  ?  ?  ?  ?  ?  ?  ?  ?  ?  ?  ?  ?  ?  ?  ?  ?  ?  ? ?  ?Prior Functioning/Environment    ?  ?  ?  ?   ? ?Frequency ? Min 1X/week  ? ? ? ? ?  ?Progress Toward Goals ? ?OT Goals(current goals can now be found in the care plan section) ? Progress towards OT goals: Progressing toward goals ? ?Acute Rehab OT Goals ?Patient Stated Goal: to get home ?OT Goal Formulation: With patient ?Time For Goal Achievement: 04/12/21 ?Potential to Achieve Goals: Good  ?Plan Discharge plan remains appropriate   ? ?Co-evaluation ? ? ?   ?  ?  ?  ?  ? ?   ?AM-PAC OT "6 Clicks" Daily Activity     ?Outcome Measure ? ? Help from another person eating meals?: None ?Help from another person taking care of personal grooming?: A Little ?Help from another person toileting, which includes using toliet, bedpan, or urinal?: A Little ?Help from another person bathing (including washing, rinsing, drying)?: A Little ?Help from another person to put on and taking off regular upper body clothing?: A Little ?Help from another person to put on and taking off regular lower body clothing?: A Little ?6 Click Score: 19 ? ?  ?End of Session Equipment Utilized During Treatment: Gait belt ? ?OT Visit Diagnosis: Unsteadiness on feet (R26.81);Muscle weakness (generalized) (M62.81);Low vision, both eyes (H54.2);Ataxia, unspecified (R27.0);Pain ?Pain - Right/Left: Left ?Pain - part of body: Ankle and joints of foot ?  ?Activity Tolerance Patient tolerated treatment well ?  ?Patient Left Other (comment) (walking with NT in hallway) ?  ?Nurse Communication Mobility status ?  ? ?   ? ?Time: 0865-7846 ?OT Time Calculation (min): 26 min ? ?Charges: OT General Charges ?$OT Visit: 1 Visit ?OT  Treatments ?$Self Care/Home Management : 8-22 mins ?$Therapeutic Activity: 8-22 mins ? ?Nyoka CowdenLaura H OTR/L ?Acute Rehabilitation Services ?Pager: [phone] ?Office: [phone] ? ?Evern BioLaura J Shawny Borkowski ?04/05/2021, 1:42 PM ?

## 2021-04-06 NOTE — Progress Notes (Addendum)
CSW submitted clinical information to [facility] to obtain a PASSR. PASSR will be a level two due to patient's history and diagnoses. ? ?Edwin Dada, MSW, LCSW ?Transitions of Care  Clinical Social Worker II ?[phone] ? ?

## 2021-04-06 NOTE — Progress Notes (Signed)
Physical Therapy Treatment ?Patient Details ?Name: Steve [name] ?MRN: [id number] ?DOB: 04-30-1965 ?Today's Date: 04/06/2021 ? ? ?History of Present Illness Pt is a 56 y.o. male admitted from group home for convalescence, combativeness and altered sensorium on 01/05/21 with witnessed seizure, AMS. 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 EEG unremarkable. LP on 12/21 showed staph in CSF, felt to be contaminant. Workup for AKI, rhabdomyolysis. Course complicated by c/o L foot pain; imaging negative for acute injury. Pt remains hospitalized due to difficult to place. Pt with psych consult who felt no need for inpt psych. No PMH in chart. ? ?  ?PT Comments  ? ? Pt continues to demonstrate improvements in mobility. Continue to work toward getting pt to a level where he will be able to mobilize with decr fall risk.    ?Recommendations for follow up therapy are one component of a multi-disciplinary discharge planning process, led by the attending physician.  Recommendations may be updated based on patient status, additional functional criteria and insurance authorization. ? ?Follow Up Recommendations ? Skilled nursing-short term rehab (<3 hours/day) ?  ?  ?Assistance Recommended at Discharge Frequent or constant Supervision/Assistance  ?Patient can return home with the following A little help with walking and/or transfers;A little help with bathing/dressing/bathroom;Assist for transportation;Help with stairs or ramp for entrance ?  ?Equipment Recommendations ? None recommended by PT  ?  ?Recommendations for Other Services   ? ? ?  ?Precautions / Restrictions Precautions ?Precautions: Fall ?Restrictions ?Weight Bearing Restrictions: No  ?  ? ?Mobility ? Bed Mobility ?  ?  ?  ?  ?  ?  ?  ?General bed mobility comments: Up in recliner at nurses station ?  ? ?Transfers ?Overall transfer level: Needs  assistance ?Equipment used: None ?Transfers: Sit to/from Stand ?Sit to Stand: Supervision ?  ?  ?  ?  ?  ?General transfer comment: supervision for safety ?  ? ?Ambulation/Gait ?Ambulation/Gait assistance: Supervision ?Gait Distance (Feet): 1000 Feet ?Assistive device: None ?Gait Pattern/deviations: Step-through pattern ?Gait velocity: decr ?Gait velocity interpretation: 1.31 - 2.62 ft/sec, indicative of limited community ambulator ?  ?General Gait Details: intermittent instability if challenged with other task while walking (speed changes, head turns) but no overt loss of balance ? ? ?Stairs ?Stairs: Yes ?Stairs assistance: Min guard ?Stair Management: Two rails, Alternating pattern, Forwards ?Number of Stairs: 5 ?  ? ? ?Wheelchair Mobility ?  ? ?Modified Rankin (Stroke Patients Only) ?  ? ? ?  ?Balance Overall balance assessment: Needs assistance ?Sitting-balance support: Feet supported, No upper extremity supported ?Sitting balance-Leahy Scale: Good ?  ?  ?Standing balance support: No upper extremity supported, During functional activity ?Standing balance-Leahy Scale: Fair ?  ?  ?  ?  ?  ?  ?  ?  ?  ?  ?  ?  ?  ? ?  ?Cognition Arousal/Alertness: Awake/alert ?Behavior During Therapy: [facility] for tasks assessed/performed, Impulsive ?Overall Cognitive Status: Impaired/Different from baseline ?Area of Impairment: Orientation, Attention, Memory, Following commands, Safety/judgement, Awareness, Problem solving ?  ?  ?  ?  ?  ?  ?  ?  ?Orientation Level: Time, Situation ?Current Attention Level: Selective ?Memory: Decreased recall of precautions, Decreased short-term memory ?Following Commands: Follows one step commands with increased time, Follows one step commands inconsistently ?Safety/Judgement: Decreased awareness of safety, Decreased awareness of deficits ?Awareness: Emergent ?Problem Solving: Requires verbal cues ?  ?  ?  ? ?  ?  Exercises General Exercises - Lower Extremity ?Hip ABduction/ADduction: Strengthening,  Both, 10 reps, Standing ?Heel Raises: Strengthening, Both, 10 reps, Standing ?Other Exercises ?Other Exercises: standing on unstable surface using upper extremity support ?Other Exercises: Single leg standing working on progressing from BUE support to single extremity support. ? ?  ?General Comments   ?  ?  ? ?Pertinent Vitals/Pain Pain Assessment ?Pain Assessment: No/denies pain  ? ? ?Home Living   ?  ?  ?  ?  ?  ?  ?  ?  ?  ?   ?  ?Prior Function    ?  ?  ?   ? ?PT Goals (current goals can now be found in the care plan section) Acute Rehab PT Goals ?PT Goal Formulation: Patient unable to participate in goal setting ?Time For Goal Achievement: 04/19/21 ?Potential to Achieve Goals: Good ?Progress towards PT goals: Goals met and updated - see care plan ? ?  ?Frequency ? ? ? Min 2X/week ? ? ? ?  ?PT Plan Current plan remains appropriate  ? ? ?Co-evaluation   ?  ?  ?  ?  ? ?  ?AM-PAC PT "6 Clicks" Mobility   ?Outcome Measure ? Help needed turning from your back to your side while in a flat bed without using bedrails?: None ?Help needed moving from lying on your back to sitting on the side of a flat bed without using bedrails?: None ?Help needed moving to and from a bed to a chair (including a wheelchair)?: A Little ?Help needed standing up from a chair using your arms (e.g., wheelchair or bedside chair)?: A Little ?Help needed to walk in hospital room?: A Little ?Help needed climbing 3-5 steps with a railing? : A Little ?6 Click Score: 20 ? ?  ?End of Session   ?Activity Tolerance: Patient tolerated treatment well ?Patient left: in chair;Other (comment) (at nurses station) ?  ?PT Visit Diagnosis: Other abnormalities of gait and mobility (R26.89);Other symptoms and signs involving the nervous system (R29.898) ?  ? ? ?Time: 1010-1027 ?PT Time Calculation (min) (ACUTE ONLY): 17 min ? ?Charges:  $Gait Training: 8-22 mins          ?          ? ?[facility] PT ?Acute Rehabilitation Services ?Pager [phone] ?Office  [phone] ? ? ? ?Shary Decamp [facility] ?04/06/2021, 1:25 PM ? ?

## 2021-04-06 NOTE — Progress Notes (Signed)
?PROGRESS NOTE ? ? ? ?Steve [name]  XBM:[id number] DOB: Oct 16, 1965 DOA: 01/05/2021 ?PCP: No primary care provider on file.  ? ? ?Brief Narrative:  ?56 year old gentleman with history of major depression anxiety and alcohol use disorder who was brought to emergency room from residential alcohol treatment center where he was staying for 5 months for combativeness and altered sensorium.  He remains in the hospital since then with his behavioral issues.  Initial presentation to the emergency room, he was confused and combative.  Had evidence of acute kidney injury with creatinine 3.27, WBC count 23.7 and lactic acid 7.6.  CT head was negative.  Infection work-up was negative.  Initially plan for hemodialysis, however did not require dialysis.  He was ultimately stabilized and transferred to medical floor.  He remains in the hospital with not able to find placement.  Actively followed by psychiatry. ? ? ?Assessment & Plan: ?  ?Acute renal failure, resolved.  Normalized. ?Severe sepsis present on admission, resolved.  Normalized. ? 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 ?Initial MRI revealed tiny acute infarct on the right but not significant enough to explain persistent cognitive and behavioral abnormalities.Patient on multiple antipsychotic medications and actively followed by neurology.  Currently remains on ?Klonopin 0.5 mg twice daily, fluoxetine 40 mg daily, Keppra 500 mg twice daily, melatonin 10 mg at night, olanzapine 10 mg at night, risperidone 1 mg at night, Depakote 500 mg twice daily, trazodone 100 mg at night.  Remains on thiamine and folic acid.  Much improved mental status.  Much improved mobility and cognition. ? ?Possible DVT: ?Catheter associated blood clot, catheter removed.   ?Patient does not need full dose anticoagulation, will keep on DVT prophylaxis dose of Xarelto.  Does not need Xarelto on discharge. ? ?Discussed with  multidisciplinary team.  Patient with cognitive deficits, unsafe discharge.  He does have underlying alcohol-related dementia that supersedes psychiatric diagnosis.  Patient will need close observation and placement.  Currently not IVC. ?He can be redirected and behavior controlled with Zyprexa. ?If patient attempts to leave, will try to redirect him to keep him in the hospital as much as possible.  If defiant, will sign IVC at that time. ? ? ?DVT prophylaxis: rivaroxaban (XARELTO) tablet 10 mg Start: 02/27/21 1000 ?rivaroxaban (XARELTO) tablet 10 mg  ? ?Code Status: Full code ?Family Communication: None ?Disposition Plan: Status is: Inpatient ? ?Remains inpatient appropriate because: No safe disposition plans. ? ? ?  ? ? ?Consultants:  ?Multiple.  Currently actively followed by psychiatry. ? ?Procedures:  ?Multiple procedures.  Currently none. ? ?Antimicrobials:  ?Completed oral antibiotics. ? ? ?Subjective: ? ?Patient seen and examined.  Pleasant.  Sitting in the chair with unit secretary.  He denies any complaints. ? ?Objective: ?Vitals:  ? 04/05/21 0755 04/05/21 1203 04/05/21 1947 04/06/21 1106  ?BP: 99/75 102/77 107/89 106/74  ?Pulse: 82 84 98 77  ?Resp: 18 18 16 18   ?Temp: 98 ?F (36.7 ?C) 98.6 ?F (37 ?C) 98.2 ?F (36.8 ?C) 98.3 ?F (36.8 ?C)  ?TempSrc:   Oral Oral  ?SpO2: 99% 100% 99% 100%  ?Weight:      ?Height:      ? ?No intake or output data in the 24 hours ending 04/06/21 1250 ? ?Filed Weights  ? 02/27/21 0501 03/05/21 0500 03/06/21 0500  ?Weight: 60.7 kg 57.9 kg 64.3 kg  ? ? ?Examination: ? ?Comfortably sitting, pleasant and talkative. ?Looks like he can comfortably walk around. ?He is alert and  oriented x2-3.  Occasionally gets irritable and impulsive. ? ? ? ?Data Reviewed: I have personally reviewed following labs and imaging studies ? ?CBC: ?No results for input(s): WBC, NEUTROABS, HGB, HCT, MCV, PLT in the last 168 hours. ? ?Basic Metabolic Panel: ?Recent Labs  ?Lab 04/02/21 ?0412  ?NA 139  ?K 3.7  ?CL  105  ?CO2 26  ?GLUCOSE 97  ?BUN 17  ?CREATININE 0.71  ?CALCIUM 8.8*  ? ?GFR: ?Estimated Creatinine Clearance: 94.9 mL/min (by C-G formula based on SCr of 0.71 mg/dL). ?Liver Function Tests: ?Recent Labs  ?Lab 04/02/21 ?0412  ?AST 32  ?ALT 53*  ?ALKPHOS 36*  ?BILITOT 0.2*  ?PROT 6.1*  ?ALBUMIN 3.2*  ? ?No results for input(s): LIPASE, AMYLASE in the last 168 hours. ?No results for input(s): AMMONIA in the last 168 hours. ?Coagulation Profile: ?No results for input(s): INR, PROTIME in the last 168 hours. ?Cardiac Enzymes: ?No results for input(s): CKTOTAL, CKMB, CKMBINDEX, TROPONINI in the last 168 hours. ?BNP (last 3 results) ?No results for input(s): PROBNP in the last 8760 hours. ?HbA1C: ?No results for input(s): HGBA1C in the last 72 hours. ?CBG: ?No results for input(s): GLUCAP in the last 168 hours. ?Lipid Profile: ?No results for input(s): CHOL, HDL, LDLCALC, TRIG, CHOLHDL, LDLDIRECT in the last 72 hours. ?Thyroid Function Tests: ?No results for input(s): TSH, T4TOTAL, FREET4, T3FREE, THYROIDAB in the last 72 hours. ?Anemia Panel: ?No results for input(s): VITAMINB12, FOLATE, FERRITIN, TIBC, IRON, RETICCTPCT in the last 72 hours. ?Sepsis Labs: ?No results for input(s): PROCALCITON, LATICACIDVEN in the last 168 hours. ? ?No results found for this or any previous visit (from the past 240 hour(s)).  ? ? ? ? ? ?Radiology Studies: ?No results found. ? ? ? ? ? ?Scheduled Meds: ? aspirin  325 mg Oral Daily  ? clonazePAM  0.5 mg Oral 2 times per day  ? docusate sodium  100 mg Oral BID  ? feeding supplement  237 mL Oral TID AC & HS  ? FLUoxetine  40 mg Oral Daily  ? folic acid  1 mg Oral Daily  ? levETIRAcetam  500 mg Oral BID  ? mouth rinse  15 mL Mouth Rinse BID  ? melatonin  10 mg Oral QHS  ? nicotine  21 mg Transdermal Daily  ? OLANZapine  10 mg Oral QHS  ? polyethylene glycol  17 g Oral Daily  ? risperiDONE  1 mg Oral QHS  ? rivaroxaban  10 mg Oral Daily  ? thiamine  250 mg Oral Daily  ? traZODone  100 mg Oral QHS   ? valproic acid  500 mg Oral BID  ? ?Continuous Infusions: ? ? LOS: 91 days  ? ? ?Time spent: 25 minutes ? ? ? ?Dorcas Carrow, MD ?[facility] ?Pager [phone]  ?

## 2021-04-06 NOTE — Progress Notes (Signed)
Patient's Wernicke's encephalopathy and alcoholic dementia supercedes any mental illness. ? ?Edwin Dada, MSW, LCSW ?Transitions of Care  Clinical Social Worker II ?[phone] ? ?

## 2021-04-06 NOTE — NC FL2 (Addendum)
?[place] MEDICAID FL2 LEVEL OF CARE SCREENING TOOL  ?  ? ?IDENTIFICATION  ?Patient Name: ?Steve [name] Birthdate: [birth year] Sex: male Admission Date (Current Location): ?01/05/2021  ?[place] and [place] Number: ? [place] ?[id number] L Facility and Address:  ?[facility] [place] [address] [place], [place] [address] ?     Provider Number: ?[id number]  ?Attending Physician Name and Address:  ?Barb Merino, MD ? Relative Name and Phone Number:  ?Jaysean Cossin - son - [phone] ?   ?Current Level of Care: ?Hospital Recommended Level of Care: ?[place] Prior Approval Number: ?  ? ?Date Approved/Denied: ?  PASRR Number: ?Pending ? ?Discharge Plan: ?SNF ?  ? ?Current Diagnoses: ?Patient Active Problem List  ? Diagnosis Date Noted  ? Cognitive Impairment 2/2 Wernicke's encephalopathy and alcohol dementia 03/29/2021  ? Pressure injury of skin 03/20/2021  ? Eczema 03/15/2021  ? Ambulatory dysfunction   ? Protein calorie malnutrition ([place]) 03/08/2021  ? Confusion   ? Sepsis ([place])   ? Witnessed seizure ([place]) 02/17/2021  ? Alcoholic dementia ([place]) [id number]  ? Physical deconditioning 2/2 ambulatory dysfunction 02/04/2021  ? Right ventricular dysfunction 02/04/2021  ? Skin abnormalities/perineal MASD 01/13/2021  ? Fever   ? Acute metabolic encephalopathy [id number]  ? ? ? ?Orientation RESPIRATION BLADDER Height & Weight   ?  ?Self, Place ? Normal Continent Weight: 141 lb 12.1 oz (64.3 kg) ?Height:  5\' 8"  (172.7 cm)  ?BEHAVIORAL SYMPTOMS/MOOD NEUROLOGICAL BOWEL NUTRITION STATUS  ?  Convulsions/Seizures Continent Diet (Normal)  ?AMBULATORY STATUS COMMUNICATION OF NEEDS Skin   ?Supervision Verbally Normal ?PU Stage 1 Dressing:  (Prn--to foot) ?  ?  ?    ?     ?     ? ? ?Personal Care Assistance Level of Assistance  ?Bathing, Feeding, Dressing Bathing Assistance: Limited assistance ?Feeding assistance: Independent ?Dressing Assistance: Limited assistance ?   ? ?Functional Limitations  Info  ?Sight, Hearing, Speech Sight Info: Adequate ?Hearing Info: Adequate ?Speech Info: Adequate  ? ? ?SPECIAL CARE FACTORS FREQUENCY  ?PT (By licensed PT), OT (By licensed OT)   ?  ?PT Frequency: 3x weekly ?OT Frequency: 3x weekly ?  ?  ?  ?   ? ? ?Contractures Contractures Info: Not present  ? ? ?Additional Factors Info  ?Allergies, Code Status, Psychotropic Code Status Info: Full Code ?Allergies Info: No known allergies ?Psychotropic Info: Risperdal, Klonopin, Prozac, Zyprexa ?  ?  ?   ? ?Current Medications (04/06/2021):  This is the current hospital active medication list ?Current Facility-Administered Medications  ?Medication Dose Route Frequency Provider Last Rate Last Admin  ? acetaminophen (TYLENOL) tablet 650 mg  650 mg Oral Q4H PRN Vernelle Emerald, MD   650 mg at 04/05/21 2126  ? aspirin tablet 325 mg  325 mg Oral Daily Greta Doom, MD   325 mg at 04/05/21 1057  ? clonazePAM (KLONOPIN) tablet 0.5 mg  0.5 mg Oral 2 times per day Thurnell Lose, MD   0.5 mg at 04/05/21 1707  ? docusate sodium (COLACE) capsule 100 mg  100 mg Oral BID Spero Geralds, MD   100 mg at 04/05/21 2126  ? feeding supplement (ENSURE ENLIVE / ENSURE PLUS) liquid 237 mL  237 mL Oral TID AC & HS Samella Parr, NP   237 mL at 04/06/21 0440  ? FLUoxetine (PROZAC) capsule 40 mg  40 mg Oral Daily Dahal, Marlowe Aschoff, MD   40 mg at 04/05/21 1054  ? folic acid (FOLVITE)  tablet 1 mg  1 mg Oral Daily Eulogio Bear U, DO   1 mg at 04/05/21 1058  ? levETIRAcetam (KEPPRA) tablet 500 mg  500 mg Oral BID Hammons, Kimberly B, RPH   500 mg at 04/05/21 2126  ? MEDLINE mouth rinse  15 mL Mouth Rinse BID Olalere, Adewale A, MD   15 mL at 04/05/21 2126  ? melatonin tablet 10 mg  10 mg Oral QHS Jonetta Osgood, MD   10 mg at 04/05/21 2126  ? nicotine (NICODERM CQ - dosed in mg/24 hours) patch 21 mg  21 mg Transdermal Daily Dahal, Marlowe Aschoff, MD   21 mg at 04/05/21 1059  ? OLANZapine (ZYPREXA) injection 5 mg  5 mg Intramuscular Q6H PRN  Barb Merino, MD   5 mg at 04/06/21 0424  ? OLANZapine (ZYPREXA) tablet 10 mg  10 mg Oral QHS Damita Dunnings B, MD   10 mg at 04/05/21 2126  ? OLANZapine (ZYPREXA) tablet 5 mg  5 mg Oral Q6H PRN Barb Merino, MD   5 mg at 04/04/21 1459  ? ondansetron (ZOFRAN) injection 4 mg  4 mg Intravenous Q8H PRN Margaretmary Lombard, MD   4 mg at 01/15/21 0208  ? polyethylene glycol (MIRALAX / GLYCOLAX) packet 17 g  17 g Oral Daily Spero Geralds, MD   17 g at 04/05/21 1053  ? polyvinyl alcohol (LIQUIFILM TEARS) 1.4 % ophthalmic solution 1 drop  1 drop Both Eyes PRN Shela Leff, MD   1 drop at 04/06/21 0004  ? risperiDONE (RISPERDAL M-TABS) disintegrating tablet 1 mg  1 mg Oral QHS Damita Dunnings B, MD   1 mg at 04/05/21 2126  ? rivaroxaban (XARELTO) tablet 10 mg  10 mg Oral Daily Samella Parr, NP   10 mg at 04/05/21 1058  ? thiamine tablet 250 mg  250 mg Oral Daily Jonetta Osgood, MD   250 mg at 04/05/21 1055  ? traZODone (DESYREL) tablet 100 mg  100 mg Oral QHS Damita Dunnings B, MD   100 mg at 04/05/21 2126  ? valproic acid (DEPAKENE) 250 MG capsule 500 mg  500 mg Oral BID Thurnell Lose, MD   500 mg at 04/05/21 2126  ? ? ? ?Discharge Medications: ?Please see discharge summary for a list of discharge medications. ? ?Relevant Imaging Results: ? ?Relevant Lab Results: ? ? ?Additional Information ?SSN: [id number] ? ?Archie Endo, LCSW ? ? ? ? ?

## 2021-04-06 NOTE — TOC Progression Note (Signed)
Transition of Care (TOC) - Progression Note  ? ? ?Patient Details  ?Name: Steve [name] ?MRN: [id number] ?Date of Birth: Aug 06, 1965 ? ?Transition of Care (TOC) CM/SW Contact  ?Carley Hammed, LCSWA ?Phone Number: ?04/06/2021, 1:26 PM ? ?Clinical Narrative:    ?CSW followed up with [place] [facility], [place] and [address], and the [place] centers. Each are reviewing for possible admission. TOC will continue to follow for DC needs. ? ? ?Expected Discharge Plan: Skilled Nursing Facility ?Barriers to Discharge: Continued Medical Work up, Homeless with medical needs, Inadequate or no insurance ? ?Expected Discharge Plan and Services ?Expected Discharge Plan: Skilled Nursing Facility ?In-house Referral: PCP / [facility] ?Discharge Planning Services: CM Consult, MATCH Program, Medication Assistance, Follow-up appt scheduled, Indigent Health Clinic ?  ?Living arrangements for the past 2 months: Group Home (Patient was resident at [facility] [place] prior to hospital admission.) ?Expected Discharge Date: 01/24/21               ?  ?  ?  ?  ?  ?  ?  ?  ?  ?  ? ? ?Social Determinants of Health (SDOH) Interventions ?  ? ?Readmission Risk Interventions ?No flowsheet data found. ? ?

## 2021-04-06 NOTE — Progress Notes (Addendum)
10:05am: ?Patient declined admission to [facility]. ? ?8:50am: ?CSW spoke with [name] of [place] [facility] who states she was successful in contacting the patient's son who is agreeable to assist with signing him into a facility once a bed offer is obtained. ? ?There are currently no bed offers for this patient at this time. CSW sent clinicals to [place] at [facility] in [place] for review. ? ?Edwin Dada, MSW, LCSW ?Transitions of Care  Clinical Social Worker II ?[phone] ? ?Agree with the care plan  ?

## 2021-04-07 DIAGNOSIS — F1097 Alcohol use, unspecified with alcohol-induced persisting dementia: Secondary | ICD-10-CM

## 2021-04-07 MED ORDER — OLANZAPINE 2.5 MG PO TABS
7.5000 mg | ORAL_TABLET | Freq: Every day | ORAL | Status: AC
  Administered 2021-04-07 – 2021-04-10 (×4): 7.5 mg via ORAL
  Filled 2021-04-07 (×4): qty 3

## 2021-04-07 NOTE — Progress Notes (Signed)
CSW sent patient's clinicals to [facility] for review. ? ?Edwin Dada, MSW, LCSW ?Transitions of Care  Clinical Social Worker II ?[phone] ? ?

## 2021-04-07 NOTE — Progress Notes (Signed)
?Progress Note ? ? ?PatientCordarro [name]:[id number] DOB: 1965/01/23 DOA: 01/05/2021     92 ?DOS: the patient was seen and examined on 04/07/2021 ?  ?Brief hospital course: ?Steve [name] is a 56 y.o. male with history of major depression, anxiety who was in a residential ETOH treatment center for last 5 months. He was brought to the ED on 12/20 from a residential ETOH treatment center for convalescence, combativeness and altered sensorium.  Per collateral history, patient was not acting typical for last few days. ? ?In the ED, patient was confused, combative. Labs showed AKI with creatinine of 3.27, rhabdomyolysis, abnormal LFTs, elevated WBC count at 23.7 and lactic acid level at 7.6. 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 Blood culture was sent, empiric antibiotic started.  IV fluid resuscitation started and patient was initially admitted to ICU. ?12/21, underwent LP which showed staph in CSF, felt to be contaminant.  EEG did not show any seizures ?12/24, HD catheter was placed but patient did not require HD as creatinine started to improve ?12/31, with clinical improvement, patient was transferred out of ICU to [facility] ?1/18 Cognitive evaluations: 2 on the SLUMS, a 9 on the MMSE, and a 0 on medi-cog and short blessed test ? ?Assessment and Plan: ?Left  LE pain ?Recurrent ?XR neg ?Plan is to use simple ankle support for ambulation ? ?* Cognitive impairment  2/2 Wernicke's encephalopathy and alcoholic dementia ?Initial MRI revealed tiny acute infarct on the right but not significant enough to explain persistent cognitive and behavioral abnormalities. Also evidence of prior lacunar infarct right basal ganglia ?Cognitive screening revealed significant cognitive deficits ?Continue Prozac, Depakote, Klonopin and HS melatonin ?Appreciate psychiatric team assistance.  Current plan is continue  Risperdal and to taper and hopefully discontinue Zyprexa.  Unclear at this juncture benefit of Depakote noting levels are subtherapeutic.  Consideration given to tapering and discontinuing this medication as well. ?Recent transaminitis improving and allergy suspected to be secondary to Zyprexa ?Continue thiamine-previously had been given high-dose IV thiamine earlier in the hospitalization with some improvement in cognition ? ?After discharge recommendations: PLEASE NOTE THAT THIS PT'S REGIMEN OF PSYCHOTROPIC MEDS CAN NEVER BE WEANED AND DC'D DUE TO SEVERITY OF HIS WERNICKE'S DEMENTIA ? ? ? ? ?Witnessed seizure ([name]) ?No apparent prior history of seizure disorder noting patient was not on AEDs prior to admission ?Unclear if withdrawal seizure or related to other substances ?3/10 given recent behavioral changes attending felt EEG needed to be completed to rule out atypical seizure activity causing behavioral manifestations.  EEG unremarkable other than for moderate diffuse encephalopathy.  No seizure activity. ?Continue Keppra at recommendation of neurology ? ?Acute kidney injury with rhabdomyolysis-resolved as of 03/17/2021 ?Resolved ?Creatinine peaked at 7.48 on 12/26.  Nephrology consulted with initial plans to initiate dialysis including placement of [facility] but renal function gradually improved and dialysis not needed and TDC removed ? ?Eczema ?Resolved with use of topical cortisone cream ?  ?2/27 ? ? ?2/27 ? ? ?3/5 ? ? ? ? ?Constipation ?Resolved ? ?Aspiration pneumonia ([name])-resolved as of 03/04/2021 ?Completed a course of Zosyn ? ?Possible DVT (deep venous thrombosis) ([name])-resolved as of 03/17/2021 ?Per critical care note, hemodialysis cath had a clot on it when it was removed.  UE duplex  12/31: no DVT.   ? ?Right ventricular dysfunction ?Echo 1/2 with EF 60 to 65%.  Cannot rule out a small PFO this  finding determined to be clinically insignificant given the patient was asymptomatic ? ?Elevated transaminases at time  of presentation-resolved as of 03/04/2021 ?Likely secondary to sepsis like physiology at presentation ?His AST and ALT were significantly elevated to over 700s on admission.  ?Re emergence mild elevation LFTs-likely due to Zyprexa and Depakote ?Due to oversedation Zyprexa dose decreased ?LFTs have normalized with decrease in Zyprexa dose. ?2/16 valproic acid level 55 ? ? ?Physical deconditioning 2/2 ambulatory dysfunction ?Influenced by patient's poor cognition as well as prior heavy alcohol abuse and malnutrition before admission ?Therapy continues to recommend SNF placement due to level of assist required with ADLs and IADLs as well as ongoing poor cognition ?See PT documentation below ? ?A physical therapy consult is indicated based on the patient?s mobility assessment. ? ? ?Mobility Assessment (last 72 hours)   ? ? Mobility Assessment   ? ? Row Name 03/15/21 1700 03/15/21 1200 03/14/21 1200 03/14/21 0952 03/14/21 0830  ? Does patient have an order for bedrest or is patient medically unstable -- No - Continue assessment -- No - Continue assessment No - Continue assessment  ? What is the highest level of mobility based on the progressive mobility assessment? Level 4 (Walks with assist in room) - Balance while marching in place and cannot step forward and back - Complete Level 4 (Walks with assist in room) - Balance while marching in place and cannot step forward and back - Complete -- Level 4 (Walks with assist in room) - Balance while marching in place and cannot step forward and back - Complete Level 4 (Walks with assist in room) - Balance while marching in place and cannot step forward and back - Complete  ? Is the above level different from baseline mobility prior to current illness? -- Yes - Recommend PT order -- Yes - Recommend PT order Yes - Recommend PT order  ? ? Row Name 03/13/21 2034 03/13/21 0800  ?  ?  ?  ? Does patient have an order for bedrest or is patient medically unstable No - Continue assessment  No - Continue assessment     ? What is the highest level of mobility based on the progressive mobility assessment? Level 4 (Walks with assist in room) - Balance while marching in place and cannot step forward and back - Complete Level 2 (Chairfast) - Balance while sitting on edge of bed and cannot stand     ? Is the above level different from baseline mobility prior to current illness? Yes - Recommend PT order Yes - Recommend PT order     ? ?  ?  ? ?  ? ? ? ? ?Severe sepsis without septic shock ([name])-resolved as of 03/17/2021 ?Ruled out ?Sepsis-like physiology secondary to presentation with profound dehydration, hypoperfusion from dehydration related hypotension seizure activity ?Chest work-up was negative ? ?Skin abnormalities/perineal MASD ?Resolved ? ?Protein calorie malnutrition ([name]) ?Patient able to feed self independently and is eating well ? ? ? ? ? ? ? ?Subjective:  ?Laying in bed in no longer requires Posey belt restraint.  Pleasant but remains confabulatory. ? ?Physical Exam: ?Vitals:  ? 04/06/21 1522 04/06/21 1920 04/07/21 0241 04/07/21 0855  ?BP: 93/74 107/78 103/72 99/73  ?Pulse: 91 89 86 78  ?Resp: 14 16 17 17   ?Temp: 98.5 ?F (36.9 ?C) 98.1 ?F (36.7 ?C) 97.9 ?F (36.6 ?C) 97.6 ?F (36.4 ?C)  ?TempSrc: Oral Oral Oral Oral  ?SpO2: 100%  99%   ?Weight:      ?Height:      ? ?  Constitutional: NAD, calm sitting in bed. ?Respiratory: Stable on room air, anterior lung sounds clear to auscultation.  Normal respiratory effort and pattern ?Cardiovascular: Normotensive, heart sounds S1-S2, no peripheral edema.  Regular pulse. ?Abdomen: no tenderness, no masses palpated. Bowel sounds positive. LBM 3/21 ?Neurologic: CN 2-12 grossly intact. Sensation intact, Strength 3+-4/5 x all 4 extremities. Significant improvement in gait and balance and able to ambulate independently at this juncture ?Skin: Previous eczematous lesions on extremities have healed after application of cortisone cream.  Has additional areas on trunk and  back. ?Psychiatric: Oriented x3.  Not to situation.  Remains confabulatory and confused.   ?There are no new results to review at this time. ? ?Family Communication:  ?Patient only ? ?Disposition: ?St

## 2021-04-07 NOTE — Consult Note (Signed)
?[name] Followup Face-to-Face Psychiatric Evaluation ? ? ?Service Date: April 07, 2021 ?LOS:  LOS: 92 days  ? ? ?Assessment  ?Steve [name] is a 56 y.o. male admitted medically for 01/05/2021  1:14 PM for full body seizure, AMS and combativeness. He carries the psychiatric diagnoses of MDD and and has a past medical history of Wernicke's encephalopathy and alcoholic dementia .Psychiatry was consulted for agitation by Steve Raring, MD.  ? ? 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 Information on premorbid diagnoses is sparse; at minimum included severe AUD. Outpatient psychotropic medications include Prozac, Trazodone and gabapentin and historically he has had an unknown response to these medications. Focus of treatment in the hospital has been on controlling behaviors and impulsivity; this is difficult given ongoing confabulations (which will not respond to medications and are not being specifically targeted) and pt's relative inability to retain information (such as his need to stay in bed due to being a fall risk). This will likely become easier as he works with PT and is afforded more mobility.  Please see plan below for detailed recommendations. Current over-arching treatment is downtitration of olanzapine due to poor response at high doses, anticholinergic side effect profile (and outside chance it is contributing to transaminitis) while introducing risperidone (please see daily plan for changes). Patient may not require direct equivalent dose of Risperdal, as Zyprexa's anticholinergic effects could be contributing to presentation and Risperdal is less likely to cause same side effects. Would like to increase depakote if LFTs improve as he seemed to have improved behavior at a higher dose; if there is no improvement early in week of 3/13 will likely need to discontinue this medication.  ?  ?  ?3/17:  Patient continues to do well without behavior episodes. Patient is ambulating more and communicating his needs. Patient appears to have occasional hallucinations but they appear comforting to patient. Patient transmisnitis appears to be improving with the reduction in Zyprexa and patient behavior is stable.  ? ? ?3/22: Patient is continuing to do well and has not had any behavioral episodes.  Per sitter during the night patient walked a few times around the unit at 2 AM but had no outbursts.  Patient continues to confabulate today focusing on getting insurance so he can get a rehab bed faster but denies any outright AVH.  As he is not having any behavioral outbursts we will attempt to decrease his Zyprexa and monitor his response to this.  We will not recommend any other medication changes at this time.  ? ? ?Diagnoses:  ?Active Hospital problems: ?Principal Problem: ?  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  Skin abnormalities/perineal MASD ?  Physical deconditioning 2/2 ambulatory dysfunction ?  Right ventricular dysfunction ?  Witnessed seizure ([name]) ?  Protein calorie malnutrition ([name]) ?  Ambulatory dysfunction ?  Eczema ?  Pressure injury of skin ?  Constipation ?  ? ? ?Plan  ?## Safety and Observation Level:  ?- Based on my clinical evaluation, I estimate the patient to be at moderate risk of self harm in the current setting ?- At this time, we recommend a close level of observation. This decision is based on my review of the chart including patient's history and current presentation, interview of the patient, mental status examination, and consideration of suicide risk including evaluating suicidal ideation, plan, intent, suicidal or self-harm behaviors, risk factors,  and protective factors. This judgment is based on our ability to directly address suicide risk, implement suicide prevention strategies and develop a safety plan while the patient is in  the clinical setting. Please contact our team if there is a concern that risk level has changed. ? ? ?## Medications:  ?-Continue klonopin 0.5mg  BID ?-Continue Prozac 40mg  ?-Continue Melatonin 10mg  QHS ?-Continue Trazodone 100mg  QHS  ?-Decrease Zyprexa to 7.5 mg QHS ?-Continue Depakene 500mg  BID ?-Depakote lvl and CMP 3/17 ?-Continue Risperdal 1mg  QHS ?-May continue Zyprexa 5mg  q8h PRN for agitation ? ?## Medical Decision Making Capacity:  ?Not formally assessed ? ?## Further Work-up:  ?-- Per Primary ? ? ? ?-- most recent EKG on 3/8 had QtC of 462 ?-- Pertinent labwork reviewed earlier this admission includes:  ?Depakote lvl (3/17): 35 ? ?## Disposition:  ?-- Per Primary ? ?## Behavioral / Environmental:  ?1: Provide appropriate lighting and clear signage; a clock and calendar should be easily visible to the patient. ?2: Continue to provide activities for patient ( coloring, word search, book) ?3: Reorient the patient to person, place, time and situation on each encounter.  ?4: Minimize use of Antipsychotics PRN. Patient benefits from constant observation by a sitter. ? ?##Legal Status ? ? ?Thank you for this consult request. Recommendations have been communicated to the primary team.  We will continue to follow at this time.  ? ?Steve FranklinAlexander S Nioka Thorington, MD ? ? ?Followup history  ?Relevant Aspects of Hospital Course:  ?Admitted on 01/05/2021 for Korsakoff. ? ?Patient Report:  ?On assessment today patient reports he slept just ok last night but that this is a chronic issue.  He reports that his appetite is doing good.  He reports no SI, HI, or AVH.  He reports no issues with his medications.   ? ?He reports frustration about still being in the hospital, he states that "it's been what 89 days?"  Discussed that it has been 92 days and that it is understandable that he is frustrated by this process.  He reports that every day it is the same answer and he keeps asking to discuss the discharge process.  Discussed with him  that Social Work has been sending his information to multiple facilities but that it is a Psychologist, clinicalmatter of bed scarcity.  He was appreciate of the information.  He then stated he has been thinking of leaving the hospital to get his own insurance as that should help speed the process up. ? ?ROS:  ?Positive for some anxiety ? ?Collateral information:  ?None ? ?Psychiatric History:  ?MDD, anxiety.  ?Vistaril 50 mg every 4 hours needed for anxiety, Prozac 40 mg daily and trazodone 50 mg at bedtime.  ? ?Family psych history: Not known per EMR ? ? ?Social History:  ? ? ?Tobacco use: Defer ?Alcohol use: Defer ?Drug use: Defer ? ?Family History:  ? ?The patient's family history is not on file. ? ?Medical History: ?History reviewed. No pertinent past medical history. ? ?Surgical History: ?History reviewed. No pertinent surgical history. ? ?Medications:  ? ?Current Facility-Administered Medications:  ?  acetaminophen (TYLENOL) tablet 650 mg, 650 mg, Oral, Q4H PRN, Shalhoub, Deno LungerGeorge J, MD, 650 mg at 04/06/21 2248 ?  aspirin tablet 325 mg, 325 mg, Oral, Daily, 325 mg at 04/07/21 0939 **OR** [DISCONTINUED] aspirin suppository 300 mg, 300 mg, Rectal, Daily, Rejeana BrockKirkpatrick, McNeill P, MD ?  clonazePAM Scarlette Calico(KLONOPIN) tablet 0.5 mg, 0.5 mg, Oral, 2 times per day, Leroy SeaSingh, Prashant K, MD, 0.5 mg at 04/07/21 [id number] ?  docusate sodium (COLACE) capsule 100 mg, 100 mg, Oral, BID, Charlott Holler, MD, 100 mg at 04/07/21 7169 ?  feeding supplement (ENSURE ENLIVE / ENSURE PLUS) liquid 237 mL, 237 mL, Oral, TID AC & HS, Russella Dar, NP, 237 mL at 04/07/21 6789 ?  FLUoxetine (PROZAC) capsule 40 mg, 40 mg, Oral, Daily, Dahal, Binaya, MD, 40 mg at 04/07/21 0939 ?  folic acid (FOLVITE) tablet 1 mg, 1 mg, Oral, Daily, Vann, Jessica U, DO, 1 mg at 04/07/21 0941 ?  levETIRAcetam (KEPPRA) tablet 500 mg, 500 mg, Oral, BID, Hammons, Kimberly B, RPH, 500 mg at 04/07/21 3810 ?  MEDLINE mouth rinse, 15 mL, Mouth Rinse, BID, Olalere, Adewale A, MD, 15 mL at 04/07/21  1001 ?  melatonin tablet 10 mg, 10 mg, Oral, QHS, Ghimire, Shanker M, MD, 10 mg at 04/06/21 2248 ?  nicotine (NICODERM CQ - dosed in mg/24 hours) patch 21 mg, 21 mg, Transdermal, Daily, Dahal, Binaya, MD, 21 mg

## 2021-04-08 DIAGNOSIS — E512 Wernicke's encephalopathy: Secondary | ICD-10-CM | POA: Diagnosis not present

## 2021-04-08 MED ORDER — CLONAZEPAM 0.5 MG PO TABS
0.5000 mg | ORAL_TABLET | Freq: Three times a day (TID) | ORAL | Status: DC
  Administered 2021-04-08 – 2021-05-14 (×107): 0.5 mg via ORAL
  Filled 2021-04-08 (×108): qty 1

## 2021-04-08 MED ORDER — IBUPROFEN 200 MG PO TABS
400.0000 mg | ORAL_TABLET | Freq: Four times a day (QID) | ORAL | Status: DC | PRN
  Administered 2021-04-09 – 2021-04-18 (×3): 400 mg via ORAL
  Filled 2021-04-08 (×5): qty 2

## 2021-04-08 NOTE — Progress Notes (Signed)
?Progress Note ? ? ?PatientNaheim [name] HGD:[id number] DOB: 1965-11-25 DOA: 01/05/2021     93 ?DOS: the patient was seen and examined on 04/08/2021 ?  ?Brief hospital course: ?Steve [name] is a 56 y.o. male with history of major depression, anxiety who was in a residential ETOH treatment center for last 5 months. He was brought to the ED on 12/20 from a residential ETOH treatment center for convalescence, combativeness and altered sensorium.  Per collateral history, patient was not acting typical for last few days. ? ?In the ED, patient was confused, combative. Labs showed AKI with creatinine of 3.27, rhabdomyolysis, abnormal LFTs, elevated WBC count at 23.7 and lactic acid level at 7.6. 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 Blood culture was sent, empiric antibiotic started.  IV fluid resuscitation started and patient was initially admitted to ICU. ?12/21, underwent LP which showed staph in CSF, felt to be contaminant.  EEG did not show any seizures ?12/24, HD catheter was placed but patient did not require HD as creatinine started to improve ?12/31, with clinical improvement, patient was transferred out of ICU to [facility]. ?1/18 Cognitive evaluations: 2 on the SLUMS, a 9 on the MMSE, and a 0 on medi-cog and short blessed test ? ?Assessment and Plan: ?* Cognitive impairment  2/2 Wernicke's encephalopathy and alcoholic dementia ?Initial MRI revealed tiny acute infarct on the right but not significant enough to explain persistent cognitive and behavioral abnormalities. Also evidence of prior lacunar infarct right basal ganglia ?Cognitive screening revealed significant cognitive deficits ?Continue Prozac, Depakote, Klonopin and HS melatonin; on 3/23 I did increase Klonopin from BID to TID at patient request due to increasing anxiety ?Appreciate psychiatric team assistance.  Continue Risperdal and  to taper and potentially discontinue Zyprexa.  Unclear at this juncture benefit of Depakote noting levels are subtherapeutic.  Consideration given to tapering and discontinuing this medication as well. ?Recent transaminitis improving and suspected to be secondary to Zyprexa ?Continue thiamine-previously had been given high-dose IV thiamine earlier in the hospitalization with some improvement in cognition ? ?After discharge recommendations: PLEASE NOTE THAT THIS PT'S REGIMEN OF PSYCHOTROPIC MEDS CAN NEVER BE WEANED AND DC'D DUE TO SEVERITY OF HIS WERNICKE'S DEMENTIA ? ? ? ? ?Witnessed seizure ([name]) ?No apparent prior history of seizure disorder noting patient was not on AEDs prior to admission ?Unclear if withdrawal seizure or related to other substances ?3/10 given recent behavioral changes attending felt EEG needed to be completed to rule out atypical seizure activity causing behavioral manifestations.  EEG unremarkable other than for moderate diffuse encephalopathy.  No seizure activity. ?Continue Keppra at recommendation of neurology ? ?Acute kidney injury with rhabdomyolysis-resolved as of 03/17/2021 ?Resolved ?Creatinine peaked at 7.48 on 12/26.  Nephrology consulted with initial plans to initiate dialysis including placement of [facility] but renal function gradually improved and dialysis not needed and TDC removed ? ?Eczema ?Resolved with use of topical cortisone cream ?  ?2/27 ? ? ?2/27 ? ? ?3/5 ? ? ? ? ?Constipation ?Resolved ? ?Aspiration pneumonia ([name])-resolved as of 03/04/2021 ?Completed a course of Zosyn ? ?Possible DVT (deep venous thrombosis) ([name])-resolved as of 03/17/2021 ?Per critical care note, hemodialysis cath had a clot on it when it was removed.  UE duplex  12/31: no DVT.   ? ?Right ventricular dysfunction ?Echo 1/2 with EF 60 to 65%.  Cannot rule out a small PFO this finding determined to be  clinically insignificant given the patient was asymptomatic ? ?Elevated transaminases at time of  presentation-resolved as of 03/04/2021 ?Likely secondary to sepsis like physiology at presentation ?His AST and ALT were significantly elevated to over 700s on admission.  ?Re emergence mild elevation LFTs-likely due to Zyprexa and Depakote ?Due to oversedation Zyprexa dose decreased ?LFTs have normalized with decrease in Zyprexa dose. ?2/16 valproic acid level 55 ? ? ?Physical deconditioning 2/2 ambulatory dysfunction ?Influenced by patient's poor cognition as well as prior heavy alcohol abuse and malnutrition before admission ?Therapy continues to recommend SNF placement due to level of assist required with ADLs and IADLs as well as ongoing poor cognition ?See PT documentation below ? ?A physical therapy consult is indicated based on the patient?s mobility assessment. ? ? ?Mobility Assessment (last 72 hours)   ? ? Mobility Assessment   ? ? Row Name 03/15/21 1700 03/15/21 1200 03/14/21 1200 03/14/21 0952 03/14/21 0830  ? Does patient have an order for bedrest or is patient medically unstable -- No - Continue assessment -- No - Continue assessment No - Continue assessment  ? What is the highest level of mobility based on the progressive mobility assessment? Level 4 (Walks with assist in room) - Balance while marching in place and cannot step forward and back - Complete Level 4 (Walks with assist in room) - Balance while marching in place and cannot step forward and back - Complete -- Level 4 (Walks with assist in room) - Balance while marching in place and cannot step forward and back - Complete Level 4 (Walks with assist in room) - Balance while marching in place and cannot step forward and back - Complete  ? Is the above level different from baseline mobility prior to current illness? -- Yes - Recommend PT order -- Yes - Recommend PT order Yes - Recommend PT order  ? ? Row Name 03/13/21 2034 03/13/21 0800  ?  ?  ?  ? Does patient have an order for bedrest or is patient medically unstable No - Continue assessment No  - Continue assessment     ? What is the highest level of mobility based on the progressive mobility assessment? Level 4 (Walks with assist in room) - Balance while marching in place and cannot step forward and back - Complete Level 2 (Chairfast) - Balance while sitting on edge of bed and cannot stand     ? Is the above level different from baseline mobility prior to current illness? Yes - Recommend PT order Yes - Recommend PT order     ? ?  ?  ? ?  ? ? ? ? ?Severe sepsis without septic shock ([name])-resolved as of 03/17/2021 ?Ruled out ?Sepsis-like physiology secondary to presentation with profound dehydration, hypoperfusion from dehydration related hypotension seizure activity ?Chest work-up was negative ? ?Skin abnormalities/perineal MASD ?Resolved ? ?Protein calorie malnutrition ([name]) ?Patient able to feed self independently and is eating well ? ? ? ? ? ? ? ?Subjective:  ?Standing by his bed.  Later sat on the bed.  Very argumentative today.  Insisting he is not going to stay in the hospital "another 2 to 3 months".  Tried to redirect him and also explain why he can no longer go home and live independently given his dementia. ? ?Physical Exam: ?Vitals:  ? 04/07/21 1954 04/07/21 2351 04/08/21 0354 04/08/21 1761  ?BP: 109/74 100/70 112/79 104/60  ?Pulse: 81 74 87 87  ?Resp: 16 18 18 16   ?Temp: 97.7 ?F (36.5 ?C)  98.2 ?F (36.8 ?C) 97.7 ?F (36.5 ?C) 97.6 ?F (36.4 ?C)  ?TempSrc: Oral Oral Oral Oral  ?SpO2: 99% 97% 100% 97%  ?Weight:      ?Height:      ? ?Constitutional: NAD, standing beside bed, finally sat on bed.  Frustrated over needing to stay in the hospital longer than he feels he should be.  Requesting more Klonopin. ?Respiratory: Stable on room air, anterior lung sounds clear to auscultation.  Normal respiratory effort and pattern ?Cardiovascular: Normotensive, heart sounds S1-S2, no peripheral edema.  Regular pulse. ?Abdomen: no tenderness, no masses palpated. Bowel sounds positive. LBM 3/21 ?Neurologic: CN 2-12  grossly intact. Sensation intact, Strength 3+-4/5 x all 4 extremities. Significant improvement in gait and balance and able to ambulate independently at this juncture ?Skin: Previous eczematous lesions on extremitie

## 2021-04-08 NOTE — Progress Notes (Signed)
Occupational Therapy Treatment ?Patient Details ?Name: Steve [name] ?MRN: [id number] ?DOB: 04/20/1965 ?Today's Date: 04/08/2021 ? ? ?History of present illness Pt is a 56 y.o. male admitted from group home for convalescence, combativeness and altered sensorium on 01/05/21 with witnessed seizure, AMS. 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 EEG unremarkable. LP on 12/21 showed staph in CSF, felt to be contaminant. Workup for AKI, rhabdomyolysis. Course complicated by c/o L foot pain; imaging negative for acute injury. Pt remains hospitalized due to difficult to place. Pt with psych consult who felt no need for inpt psych. No PMH in chart. ?  ?OT comments ? Pt with increased cognition, balance, and endurance this session. He was able to carry on a relevant conversation related to his hospital stay and cognition. At this time, physically, pt requiring supervision for safety, however due to cognition, depending on the task, pt continues to need min-mod verbal cues. Continuing to recommend SNF level therapies as follow up, OT will continue to follow acutely.   ? ?Recommendations for follow up therapy are one component of a multi-disciplinary discharge planning process, led by the attending physician.  Recommendations may be updated based on patient status, additional functional criteria and insurance authorization. ?   ?Follow Up Recommendations ? Skilled nursing-short term rehab (<3 hours/day)  ?  ?Assistance Recommended at Discharge Frequent or constant Supervision/Assistance  ?Patient can return home with the following ? A little help with walking and/or transfers;A little help with bathing/dressing/bathroom;Assistance with cooking/housework;Assist for transportation;Help with stairs or ramp for entrance ?  ?Equipment Recommendations ? None recommended by OT  ?  ?Recommendations for Other Services    ? ?  ?Precautions / Restrictions Precautions ?Precautions: Fall ?Precaution Comments: has a sitter ?Restrictions ?Weight Bearing Restrictions: No ?LLE Weight Bearing: Weight bearing as tolerated  ? ? ?  ? ?Mobility Bed Mobility ?Overal bed mobility: Modified Independent ?  ?  ?  ?  ?  ?  ?  ?  ? ?Transfers ?Overall transfer level: Needs assistance ?Equipment used: None ?Transfers: Sit to/from Stand ?Sit to Stand: Supervision ?  ?  ?  ?  ?  ?General transfer comment: supervision for safety ?  ?  ?Balance Overall balance assessment: Needs assistance ?Sitting-balance support: Feet supported, No upper extremity supported ?Sitting balance-Leahy Scale: Normal ?  ?  ?Standing balance support: No upper extremity supported, During functional activity ?Standing balance-Leahy Scale: Fair ?Standing balance comment: supervision for safety, some instances of wobbles ?  ?  ?  ?  ?  ?  ?  ?  ?  ?  ?  ?   ? ?ADL either performed or assessed with clinical judgement  ? ?ADL Overall ADL's : Needs assistance/impaired ?Eating/Feeding: Independent;Sitting ?Eating/Feeding Details (indicate cue type and reason): Set up his own food and fed himself at end of session ?Grooming: Wash/dry hands;Wash/dry face;Supervision/safety;Standing ?Grooming Details (indicate cue type and reason): completed at sink ?  ?  ?  ?  ?  ?  ?  ?  ?Toilet Transfer: Supervision/safety;Ambulation ?Toilet Transfer Details (indicate cue type and reason): no difficulties this session ?  ?  ?  ?  ?Functional mobility during ADLs: Supervision/safety ?General ADL Comments: Pt with improved mobility and cognition this session, requiring no physical assistance for tasks completed. ?  ? ?Extremity/Trunk Assessment   ?  ?  ?  ?  ?  ? ?Vision   ?  ?  ?  Perception   ?  ?Praxis   ?  ? ?Cognition Arousal/Alertness: Awake/alert ?Behavior During Therapy: [facility] for tasks assessed/performed, Impulsive ?Overall Cognitive Status: No family/caregiver present to determine baseline cognitive  functioning ?  ?  ?  ?  ?  ?  ?  ?  ?  ?  ?  ?  ?  ?  ?  ?  ?General Comments: Pt more clear this session, able to state that he was in the hospital and knew what his room number was. Pt conversation was on topic of why he was in the hospital, able to stay on topic for entire session. Very pleasant and cooperative. ?  ?  ?   ?Exercises   ? ?  ?Shoulder Instructions   ? ? ?  ?General Comments VSS on RA, sitter  ? ? ?Pertinent Vitals/ Pain       Pain Assessment ?Pain Assessment: No/denies pain ? ?Home Living   ?  ?  ?  ?  ?  ?  ?  ?  ?  ?  ?  ?  ?  ?  ?  ?  ?  ?  ? ?  ?Prior Functioning/Environment    ?  ?  ?  ?   ? ?Frequency ? Min 1X/week  ? ? ? ? ?  ?Progress Toward Goals ? ?OT Goals(current goals can now be found in the care plan section) ? Progress towards OT goals: Progressing toward goals ? ?Acute Rehab OT Goals ?Patient Stated Goal: To go home once hes better ?OT Goal Formulation: With patient ?Time For Goal Achievement: 04/12/21 ?Potential to Achieve Goals: Good ?ADL Goals ?Pt Will Perform Grooming: with supervision;standing ?Pt Will Perform Lower Body Bathing: with set-up;sit to/from stand ?Pt Will Perform Lower Body Dressing: with set-up;sitting/lateral leans ?Pt Will Transfer to Toilet: with supervision;ambulating ?Pt Will Perform Toileting - Clothing Manipulation and hygiene: with supervision;sitting/lateral leans ?Additional ADL Goal #1: Pt will demonstrate increased attention to task, requiring less than 3 vc during a 5 min ADL task  ?Plan Discharge plan remains appropriate   ? ?Co-evaluation ? ? ?   ?  ?  ?  ?  ? ?  ?AM-PAC OT "6 Clicks" Daily Activity     ?Outcome Measure ? ? Help from another person eating meals?: None ?Help from another person taking care of personal grooming?: A Little ?Help from another person toileting, which includes using toliet, bedpan, or urinal?: A Little ?Help from another person bathing (including washing, rinsing, drying)?: A Little ?Help from another person to put on and  taking off regular upper body clothing?: A Little ?Help from another person to put on and taking off regular lower body clothing?: A Little ?6 Click Score: 19 ? ?  ?End of Session   ? ?OT Visit Diagnosis: Unsteadiness on feet (R26.81);Muscle weakness (generalized) (M62.81);Low vision, both eyes (H54.2);Ataxia, unspecified (R27.0);Pain ?Pain - Right/Left: Left ?Pain - part of body: Ankle and joints of foot ?  ?Activity Tolerance Patient tolerated treatment well ?  ?Patient Left in bed;with nursing/sitter in room;with call bell/phone within reach (Eating lunch) ?  ?Nurse Communication Mobility status ?  ? ?   ? ?Time: 5852-7782 ?OT Time Calculation (min): 11 min ? ?Charges: OT General Charges ?$OT Visit: 1 Visit ?OT Treatments ?$Therapeutic Activity: 8-22 mins ? ?Valentino Saavedra H., OTR/L ?Acute Rehabilitation ? ?Jackeline Gutknecht Elane Bing Plume ?04/08/2021, 3:41 PM ?

## 2021-04-08 NOTE — Progress Notes (Addendum)
Speech Language Pathology Treatment: Cognitive-Linquistic  ?Patient Details ?Name: Steve [name] ?MRN: [id number] ?DOB: 1965/02/18 ?Today's Date: 04/08/2021 ?Time: 1324-4010 ?SLP Time Calculation (min) (ACUTE ONLY): 22 min ? ?Assessment / Plan / Recommendation ?Clinical Impression ? Pt was seen for cognitive-linguistic treatment. He was alert and cooperative during the session with notably improved attention compared to last week. Pt reported that his "thinking" is much better and that, "I was answering all the questions when I was walking this morning; I mean I was whipping those things out faster that they even could!" Visual hallucinations continue to be noted during the session; today pt referenced seeing puppies outside his window, but no reference was made to individuals in his room. The [facility] Mental Status Examination was completed to re-evaluate the pt's cognitive-linguistic skills. He achieved a score of 14/30. This remains below the normal limits of 27 or more out of 30, but it is notably improved compared to his score of 1/30 which he achieved on 02/12/21. He exhibited deficits in the areas of awareness, memory, temporal orientation, sustained attention, complex problem solving, and executive function. Pt's case was discussed with psychiatry who expressed that some of the pt's medications have been adjusted and that this could be positively impacting his progress. SLP will update goals based on his current performance to address intervention and compensation.  ?  ?HPI HPI: Pt is a 56 y.o. male admitted from group home (substance abuse rehab?) on 01/05/21 with witnessed seizure, AMS. 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 EEG unremarkable. LP on 12/21 showed staph in CSF, felt to be contaminant. Workup for AKI, rhabdomyolysis. Dx Uremic encephalopathy. Course  complicated by c/o L foot pain; imaging negative for acute injury. Pt remains hospitalized due to difficulty with placement. SLP evaluation on 01/26/21: 9/27 on Mini Mental State Exam, SLUMS 02/03/21: 2/30; SLUMS 1/27: 1/30. Psych consulted and as of 1/16, pt did not meet criteria for inpatient psychiatric admission. ?  ? SLP Evaluation ?Cognition ? Arousal/Alertness: Awake/alert ?Orientation Level: Oriented to person;Oriented to place;Disoriented to time;Disoriented to situation ?Year: 2023 ?Month: February ?Day of Week: Correct ?Attention: Focused;Sustained ?Focused Attention: Impaired ?Focused Attention Impairment: Verbal complex ?Sustained Attention: Impaired ?Sustained Attention Impairment: Verbal complex ?Memory: Impaired ?Memory Impairment: Storage deficit;Decreased recall of new information;Decreased short term memory (Immediate: 5/5 with repetition; delayed: 4/5; with cue: 0/1) ?Awareness: Impaired ?Awareness Impairment: Emergent impairment ?Problem Solving: Impaired ?Problem Solving Impairment: Verbal basic ?Executive Function: Reasoning;Sequencing;Organizing ?Reasoning: Impaired ?Reasoning Impairment: Verbal complex ?Sequencing: Impaired ?Sequencing Impairment:  (clock drawing:) ?Organizing Impairment: Verbal complex (Backward digit span: 0/3)  ?  ?  ?SLP Plan ? Goals updated ? ?  ?  ?Recommendations for follow up therapy are one component of a multi-disciplinary discharge planning process, led by the attending physician.  Recommendations may be updated based on patient status, additional functional criteria and insurance authorization. ?  ? ?Recommendations  ?   ?   ?    ?   ? ? ? ? Oral Care Recommendations: Oral care BID ?Follow Up Recommendations: Skilled nursing-short term rehab (<3 hours/day) ?Assistance recommended at discharge: Frequent or constant Supervision/Assistance ?SLP Visit Diagnosis: Cognitive communication deficit (R41.841);Attention and concentration deficit ?Attention and concentration  deficit following: Other cerebrovascular disease ?Plan: Goals updated ? ? ? ? ?  ?  ?Jagjit Riner I. [name], MS, CCC-SLP ?Acute Rehabilitation Services ?Office number [phone] ?Pager [phone] ? ? ?[name] ? ?  04/08/2021, 3:31 PM ? ? ? ?

## 2021-04-08 NOTE — Progress Notes (Signed)
CSW sent patient's clinicals to [place] at [facility] for review. ? ?Edwin Dada, MSW, LCSW ?Transitions of Care  Clinical Social Worker II ?[phone] ? ?

## 2021-04-08 NOTE — Consult Note (Signed)
Brief Psychiatric Consult Note ? ?On interview today patient reports that he is doing good.  He reports no issues with the decrease in his Zyprexa.  He reports that he slept good last night.  He reports that his appetite is doing good.  He reports no SI, HI, or AVH.  He reports no issues with his medications.  During the interview he asked if interview could see the dogs out the window.  He then pointed to the hospital roof out the window and said he could see 8 dogs out there. ? ?Asked patients sitter how the patient did over night.  She reports that evening sitter reported patient sleeping about 4 hours last night with no behavioral issues. ? ? ?Plan we will continue to follow the patient and will plan to further reduce his Zyprexa to 5 mg QHS on Sunday evening. ? ? ?Arna Snipe MD ?Resident ? ?

## 2021-04-09 MED ORDER — OLANZAPINE 2.5 MG PO TABS
5.0000 mg | ORAL_TABLET | Freq: Every day | ORAL | Status: DC
  Administered 2021-04-11 – 2021-04-14 (×4): 5 mg via ORAL
  Filled 2021-04-09 (×4): qty 2

## 2021-04-09 NOTE — Progress Notes (Signed)
?Progress Note ? ? ?PatientBrayant [name] OMB:[id number] DOB: 06/29/1965 DOA: 01/05/2021     94 ?DOS: the patient was seen and examined on 04/09/2021 ?  ?Brief hospital course: ?Steve [name] is a 56 y.o. male with history of major depression, anxiety who was in a residential ETOH treatment center for last 5 months. He was brought to the ED on 12/20 from a residential ETOH treatment center for convalescence, combativeness and altered sensorium.  Per collateral history, patient was not acting typical for last few days. ? ?In the ED, patient was confused, combative. Labs showed AKI with creatinine of 3.27, rhabdomyolysis, abnormal LFTs, elevated WBC count at 23.7 and lactic acid level at 7.6. 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 Blood culture was sent, empiric antibiotic started.  IV fluid resuscitation started and patient was initially admitted to ICU. ?12/21, underwent LP which showed staph in CSF, felt to be contaminant.  EEG did not show any seizures ?12/24, HD catheter was placed but patient did not require HD as creatinine started to improve ?12/31, with clinical improvement, patient was transferred out of ICU to [facility]. ?1/18 Cognitive evaluations: 2 on the SLUMS, a 9 on the MMSE, and a 0 on medi-cog and short blessed test ? ?Assessment and Plan: ?* Cognitive impairment  2/2 Wernicke's encephalopathy and alcoholic dementia ?Initial MRI revealed tiny acute infarct on the right but not significant enough to explain persistent cognitive and behavioral abnormalities. Also evidence of prior lacunar infarct right basal ganglia ?Cognitive screening revealed significant cognitive deficits ?Continue Prozac, Depakote, Klonopin and HS melatonin; on 3/23 I did increase Klonopin from BID to TID at patient request due to increasing anxiety ?Appreciate psychiatric team assistance.  Continue Risperdal and  to taper and potentially discontinue Zyprexa.  Unclear at this juncture benefit of Depakote noting levels are subtherapeutic.  Consideration given to tapering and discontinuing this medication as well. ?Recent transaminitis improving and suspected to be secondary to Zyprexa ?Continue thiamine-previously had been given high-dose IV thiamine earlier in the hospitalization with some improvement in cognition ? ?After discharge recommendations: PLEASE NOTE THAT THIS PT'S REGIMEN OF PSYCHOTROPIC MEDS CAN NEVER BE WEANED AND DC'D DUE TO SEVERITY OF HIS WERNICKE'S DEMENTIA ? ? ? ? ?Witnessed seizure ([name]) ?No apparent prior history of seizure disorder noting patient was not on AEDs prior to admission ?Unclear if withdrawal seizure or related to other substances ?3/10 given recent behavioral changes attending felt EEG needed to be completed to rule out atypical seizure activity causing behavioral manifestations.  EEG unremarkable other than for moderate diffuse encephalopathy.  No seizure activity. ?Continue Keppra at recommendation of neurology ? ?Acute kidney injury with rhabdomyolysis-resolved as of 03/17/2021 ?Resolved ?Creatinine peaked at 7.48 on 12/26.  Nephrology consulted with initial plans to initiate dialysis including placement of [facility] but renal function gradually improved and dialysis not needed and TDC removed ? ?Eczema ?Resolved with use of topical cortisone cream ?  ?2/27 ? ? ?2/27 ? ? ?3/5 ? ? ? ? ?Constipation ?Resolved ? ?Aspiration pneumonia ([name])-resolved as of 03/04/2021 ?Completed a course of Zosyn ? ?Possible DVT (deep venous thrombosis) ([name])-resolved as of 03/17/2021 ?Per critical care note, hemodialysis cath had a clot on it when it was removed.  UE duplex  12/31: no DVT.   ? ?Right ventricular dysfunction ?Echo 1/2 with EF 60 to 65%.  Cannot rule out a small PFO this finding determined to be  clinically insignificant given the patient was asymptomatic ? ?Elevated transaminases at time of  presentation-resolved as of 03/04/2021 ?Likely secondary to sepsis like physiology at presentation ?His AST and ALT were significantly elevated to over 700s on admission.  ?Re emergence mild elevation LFTs-likely due to Zyprexa and Depakote ?Due to oversedation Zyprexa dose decreased ?LFTs have normalized with decrease in Zyprexa dose. ?2/16 valproic acid level 55 ? ? ?Physical deconditioning 2/2 ambulatory dysfunction ?Influenced by patient's poor cognition as well as prior heavy alcohol abuse and malnutrition before admission ?Therapy continues to recommend SNF placement due to level of assist required with ADLs and IADLs as well as ongoing poor cognition ?See PT documentation below ? ?A physical therapy consult is indicated based on the patient?s mobility assessment. ? ? ?Mobility Assessment (last 72 hours)   ? ? Mobility Assessment   ? ? Row Name 03/15/21 1700 03/15/21 1200 03/14/21 1200 03/14/21 0952 03/14/21 0830  ? Does patient have an order for bedrest or is patient medically unstable -- No - Continue assessment -- No - Continue assessment No - Continue assessment  ? What is the highest level of mobility based on the progressive mobility assessment? Level 4 (Walks with assist in room) - Balance while marching in place and cannot step forward and back - Complete Level 4 (Walks with assist in room) - Balance while marching in place and cannot step forward and back - Complete -- Level 4 (Walks with assist in room) - Balance while marching in place and cannot step forward and back - Complete Level 4 (Walks with assist in room) - Balance while marching in place and cannot step forward and back - Complete  ? Is the above level different from baseline mobility prior to current illness? -- Yes - Recommend PT order -- Yes - Recommend PT order Yes - Recommend PT order  ? ? Row Name 03/13/21 2034 03/13/21 0800  ?  ?  ?  ? Does patient have an order for bedrest or is patient medically unstable No - Continue assessment No  - Continue assessment     ? What is the highest level of mobility based on the progressive mobility assessment? Level 4 (Walks with assist in room) - Balance while marching in place and cannot step forward and back - Complete Level 2 (Chairfast) - Balance while sitting on edge of bed and cannot stand     ? Is the above level different from baseline mobility prior to current illness? Yes - Recommend PT order Yes - Recommend PT order     ? ?  ?  ? ?  ? ? ? ? ?Severe sepsis without septic shock ([name])-resolved as of 03/17/2021 ?Ruled out ?Sepsis-like physiology secondary to presentation with profound dehydration, hypoperfusion from dehydration related hypotension seizure activity ?Chest work-up was negative ? ?Skin abnormalities/perineal MASD ?Resolved ? ?Protein calorie malnutrition ([name]) ?Patient able to feed self independently and is eating well ? ? ? ? ? ? ? ?Subjective:  ?Alert and less anxious today.  Continues to confabulate.  During our conversation when I spoke with his son he immediately interjected by saying "yes me and my son need to bury her body in the backyard" sleeping better and informed patient that if he needed a nocturnal sleep aid we could find an appropriate medication if necessary.  Reporting less anxiety since Xanax increased to 3 times per day ? ?Physical Exam: ?Vitals:  ? 04/08/21 2000 04/09/21 [id number] 04/09/21 0345 04/09/21 0737  ?BP: 110/70 96/69 97/69  107/76  ?Pulse: 78 71 89 81  ?Resp: 18 18 18 20   ?Temp: 98 ?F (36.7 ?C) 97.9 ?F (36.6 ?C) 97.7 ?F (36.5 ?C) 97.8 ?F (36.6 ?C)  ?TempSrc: Oral Oral Oral Oral  ?SpO2: 98% 98% 100% 100%  ?Weight:      ?Height:      ? ?Constitutional: NAD, pleasant and common not as restless as 3/23 ?Respiratory: Stable on room air, anterior lung sounds clear to auscultation.  Normal respiratory effort and pattern ?Cardiovascular: Normotensive, S1-S2, no peripheral edema.  Regular pulse. ?Abdomen: no tenderness, no masses palpated. Bowel sounds positive. LBM  3/23 ?Neurologic: CN 2-12 grossly intact. Sensation intact, Strength 5/5 x all 4 extremities.  Able to ambulate independently without gait disturbance ?Psychiatric: Oriented x3.  Short-term memory not intact and continues to

## 2021-04-09 NOTE — Progress Notes (Signed)
Physical Therapy Treatment ?Patient Details ?Name: Steve [name] ?MRN: [id number] ?DOB: 07-Mar-1965 ?Today's Date: 04/09/2021 ? ? ?History of Present Illness Pt is a 56 y.o. male admitted from group home for convalescence, combativeness and altered sensorium on 01/05/21 with witnessed seizure, AMS. 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 EEG unremarkable. LP on 12/21 showed staph in CSF, felt to be contaminant. Workup for AKI, rhabdomyolysis. Course complicated by c/o L foot pain; imaging negative for acute injury. Pt remains hospitalized due to difficult to place. Pt with psych consult who felt no need for inpt psych. No PMH in chart. ? ?  ?PT Comments  ? ? Pt's mobility has continued to improve and he is reaching his maximum potential with PT. Will try to obtain shoes for pt to see if if improves his left foot pain with amb. Then will sign off. Will need supervised long term setting.    ?Recommendations for follow up therapy are one component of a multi-disciplinary discharge planning process, led by the attending physician.  Recommendations may be updated based on patient status, additional functional criteria and insurance authorization. ? ?Follow Up Recommendations ? Long-term institutional care without follow-up therapy ?  ?  ?Assistance Recommended at Discharge Intermittent Supervision/Assistance  ?Patient can return home with the following Direct supervision/assist for medications management;Direct supervision/assist for financial management;Assist for transportation ?  ?Equipment Recommendations ? None recommended by PT  ?  ?Recommendations for Other Services   ? ? ?  ?Precautions / Restrictions Precautions ?Precautions: Fall ?Restrictions ?Weight Bearing Restrictions: No  ?  ? ?Mobility ? Bed Mobility ?Overal bed mobility: Independent ?  ?  ?  ?  ?  ?  ?  ?  ? ?Transfers ?Overall  transfer level: Modified independent ?Equipment used: None ?Transfers: Sit to/from Stand ?Sit to Stand: Modified independent (Device/Increase time) ?  ?  ?  ?  ?  ?General transfer comment: Steady to rise ?  ? ?Ambulation/Gait ?Ambulation/Gait assistance: Supervision ?Gait Distance (Feet): 1000 Feet ?Assistive device: None ?Gait Pattern/deviations: Step-through pattern, Antalgic ?Gait velocity: decr ?Gait velocity interpretation: >2.62 ft/sec, indicative of community ambulatory ?  ?General Gait Details: Steady gait with slight limp due to lt foot pain. ? ? ?Stairs ?  ?Stairs assistance: Supervision ?Stair Management: Two rails, Alternating pattern, Forwards ?Number of Stairs: 5 ?  ? ? ?Wheelchair Mobility ?  ? ?Modified Rankin (Stroke Patients Only) ?  ? ? ?  ?Balance Overall balance assessment: Needs assistance ?Sitting-balance support: Feet supported, No upper extremity supported ?Sitting balance-Leahy Scale: Normal ?  ?  ?Standing balance support: No upper extremity supported, During functional activity ?Standing balance-Leahy Scale: Good ?  ?  ?  ?  ?  ?  ?  ?  ?  ?  ?  ?  ?  ? ?  ?Cognition Arousal/Alertness: Awake/alert ?Behavior During Therapy: [facility] for tasks assessed/performed, Impulsive ?Overall Cognitive Status: Impaired/Different from baseline ?Area of Impairment: Orientation, Attention, Memory, Following commands, Safety/judgement, Awareness, Problem solving ?  ?  ?  ?  ?  ?  ?  ?  ?Orientation Level: Time, Situation, Place ?Current Attention Level: Selective ?Memory: Decreased recall of precautions, Decreased short-term memory ?Following Commands: Follows one step commands with increased time, Follows one step commands inconsistently ?Safety/Judgement: Decreased awareness of safety, Decreased awareness of deficits ?Awareness: Emergent ?Problem Solving: Requires verbal cues ?  ?  ?  ? ?  ?Exercises  Other Exercises ?Other Exercises: bil calf stretch standing on edge of stair ? ?  ?General Comments   ?  ?   ? ?Pertinent Vitals/Pain Pain Assessment ?Pain Assessment: 0-10 ?Pain Score: 8  ?Pain Location: lt foot ?Pain Descriptors / Indicators: Guarding ?Pain Intervention(s): Monitored during session  ? ? ?Home Living   ?  ?  ?  ?  ?  ?  ?  ?  ?  ?   ?  ?Prior Function    ?  ?  ?   ? ?PT Goals (current goals can now be found in the care plan section) Progress towards PT goals: Progressing toward goals ? ?  ?Frequency ? ? ? Min 2X/week ? ? ? ?  ?PT Plan Discharge plan needs to be updated  ? ? ?Co-evaluation   ?  ?  ?  ?  ? ?  ?AM-PAC PT "6 Clicks" Mobility   ?Outcome Measure ? Help needed turning from your back to your side while in a flat bed without using bedrails?: None ?Help needed moving from lying on your back to sitting on the side of a flat bed without using bedrails?: None ?Help needed moving to and from a bed to a chair (including a wheelchair)?: None ?Help needed standing up from a chair using your arms (e.g., wheelchair or bedside chair)?: None ?Help needed to walk in hospital room?: A Little ?Help needed climbing 3-5 steps with a railing? : A Little ?6 Click Score: 22 ? ?  ?End of Session   ?Activity Tolerance: Patient tolerated treatment well ?Patient left: in bed;with bed alarm set;with call bell/phone within reach ?  ?PT Visit Diagnosis: Other abnormalities of gait and mobility (R26.89);Other symptoms and signs involving the nervous system (R29.898) ?  ? ? ?Time: 5038-8828 ?PT Time Calculation (min) (ACUTE ONLY): 11 min ? ?Charges:  $Gait Training: 8-22 mins          ?          ? ?[facility] PT ?Acute Rehabilitation Services ?Pager [phone] ?Office [phone] ? ? ? ?Angelina Ok [facility] ?04/09/2021, 3:21 PM ? ?

## 2021-04-10 DIAGNOSIS — E512 Wernicke's encephalopathy: Secondary | ICD-10-CM | POA: Diagnosis not present

## 2021-04-10 LAB — CBC WITH DIFFERENTIAL/PLATELET
Abs Immature Granulocytes: 0.04 10*3/uL (ref 0.00–0.07)
Basophils Absolute: 0.1 10*3/uL (ref 0.0–0.1)
Basophils Relative: 1 %
Eosinophils Absolute: 0.3 10*3/uL (ref 0.0–0.5)
Eosinophils Relative: 5 %
HCT: 33.8 % — ABNORMAL LOW (ref 39.0–52.0)
Hemoglobin: 11.6 g/dL — ABNORMAL LOW (ref 13.0–17.0)
Immature Granulocytes: 1 %
Lymphocytes Relative: 44 %
Lymphs Abs: 2.3 10*3/uL (ref 0.7–4.0)
MCH: 34.5 pg — ABNORMAL HIGH (ref 26.0–34.0)
MCHC: 34.3 g/dL (ref 30.0–36.0)
MCV: 100.6 fL — ABNORMAL HIGH (ref 80.0–100.0)
Monocytes Absolute: 0.7 10*3/uL (ref 0.1–1.0)
Monocytes Relative: 13 %
Neutro Abs: 1.9 10*3/uL (ref 1.7–7.7)
Neutrophils Relative %: 36 %
Platelets: 228 10*3/uL (ref 150–400)
RBC: 3.36 MIL/uL — ABNORMAL LOW (ref 4.22–5.81)
RDW: 14.6 % (ref 11.5–15.5)
WBC: 5.3 10*3/uL (ref 4.0–10.5)
nRBC: 0 % (ref 0.0–0.2)

## 2021-04-10 LAB — COMPREHENSIVE METABOLIC PANEL
ALT: 44 U/L (ref 0–44)
AST: 26 U/L (ref 15–41)
Albumin: 3.3 g/dL — ABNORMAL LOW (ref 3.5–5.0)
Alkaline Phosphatase: 34 U/L — ABNORMAL LOW (ref 38–126)
Anion gap: 4 — ABNORMAL LOW (ref 5–15)
BUN: 19 mg/dL (ref 6–20)
CO2: 28 mmol/L (ref 22–32)
Calcium: 9 mg/dL (ref 8.9–10.3)
Chloride: 108 mmol/L (ref 98–111)
Creatinine, Ser: 0.71 mg/dL (ref 0.61–1.24)
GFR, Estimated: >60 mL/min (ref >60)
Glucose, Bld: 96 mg/dL (ref 70–99)
Potassium: 3.9 mmol/L (ref 3.5–5.1)
Sodium: 140 mmol/L (ref 135–145)
Total Bilirubin: 0.4 mg/dL (ref 0.3–1.2)
Total Protein: 6.3 g/dL — ABNORMAL LOW (ref 6.5–8.1)

## 2021-04-10 NOTE — Plan of Care (Signed)
?  Problem: Health Behavior/Discharge Planning: ?Goal: Ability to manage health-related needs will improve ?Outcome: Not Progressing ?  ?Problem: Coping: ?Goal: Level of anxiety will decrease ?Outcome: Not Progressing ?  ?Problem: Safety: ?Goal: Ability to remain free from injury will improve ?Outcome: Not Progressing ?  ?Problem: Education: ?Goal: Expressions of having a comfortable level of knowledge regarding the disease process will increase ?Outcome: Not Progressing ?  ?Problem: Safety: ?Goal: Verbalization of understanding the information provided will improve ?Outcome: Not Progressing ?  ?Problem: Self-Concept: ?Goal: Level of anxiety will decrease ?Outcome: Not Progressing ?  ?

## 2021-04-10 NOTE — Progress Notes (Signed)
? ? ? [facility] ?                                                                            ? ? ?Steve [name], is a 56 y.o. male, DOB - 29-Sep-1965, TKZ:[id number] ?Admit date - 01/05/2021    ?Outpatient Primary MD for the patient is No primary care provider on file. ? ?LOS - 95  days ? ? ? ?Brief summary  ? ?Steve [name] is a 56 y.o. male with history of major depression, anxiety who was in a residential ETOH treatment center for last 5 months. He was brought to the ED on 12/20 from a residential ETOH treatment center for convalescence, combativeness and altered sensorium.  CT head negative for acute bleeding, but showed prominent fluid space at the left cerebral pontine angle raising possibility of epidermoid arachnoid cyst.  ? ? ?Assessment & Plan  ? ? ?Assessment and Plan: ?* Cognitive impairment  2/2 Wernicke's encephalopathy and alcoholic dementia ?Initial MRI revealed tiny acute infarct on the right but not significant enough to explain persistent cognitive and behavioral abnormalities. Also evidence of prior lacunar infarct right basal ganglia ?Cognitive screening revealed significant cognitive deficits ?Continue Prozac, Depakote, Klonopin and HS melatonin; on 3/23 I did increase Klonopin from BID to TID at patient request due to increasing anxiety ?Appreciate psychiatric team assistance.  Continue Risperdal and to taper and potentially discontinue Zyprexa.  Unclear at this juncture benefit of Depakote noting levels are subtherapeutic.  Consideration given to tapering and discontinuing this medication as well. ?Recent transaminitis improving and suspected to be secondary to Zyprexa ?Continue thiamine-previously had been given high-dose IV thiamine earlier in the hospitalization with some improvement in cognition ? ?Constipation ?Resolved ? ?Eczema ?Resolved with use of topical cortisone cream. ? ?Protein calorie malnutrition ([name]) ?Patient able to feed self independently and is eating  well ? ?Witnessed seizure ([name]) ?No apparent prior history of seizure disorder noting patient was not on AEDs prior to admission ?Unclear if withdrawal seizure or related to other substances ?3/10 given recent behavioral changes attending felt EEG needed to be completed to rule out atypical seizure activity causing behavioral manifestations.  EEG unremarkable other than for moderate diffuse encephalopathy.  No seizure activity. ?Continue Keppra at recommendation of neurology ? ?Right ventricular dysfunction ?Echo 1/2 with EF 60 to 65%.  Cannot rule out a small PFO this finding determined to be clinically insignificant given the patient was asymptomatic ? ?Physical deconditioning 2/2 ambulatory dysfunction ?Influenced by patient's poor cognition as well as prior heavy alcohol abuse and malnutrition before admission ?Therapy continues to recommend SNF placement due to level of assist required with ADLs and IADLs as well as ongoing poor cognition ? ?Skin abnormalities/perineal MASD ?Resolved ? ?Elevated transaminases at time of presentation-resolved as of 03/04/2021 ?Likely secondary to sepsis like physiology at presentation ?His AST and ALT were significantly elevated to over 700s on admission.  ?Re emergence mild elevation LFTs-likely due to Zyprexa and Depakote ?Due to oversedation Zyprexa dose decreased ?LFTs have normalized with decrease in Zyprexa dose. ?2/16 valproic acid level 55 ? ? ?Aspiration pneumonia ([name])-resolved as of 03/04/2021 ?Completed a course of Zosyn ? ?Possible DVT (deep venous thrombosis) ([name])-resolved as of 03/17/2021 ?  Per critical care note, hemodialysis cath had a clot on it when it was removed.  UE duplex  12/31: no DVT.   ? ?Acute kidney injury with rhabdomyolysis-resolved as of 03/17/2021 ?Resolved ?Creatinine peaked at 7.48 on 12/26.  Nephrology consulted with initial plans to initiate dialysis including placement of [facility] but renal function gradually improved and dialysis not needed and TDC  removed ? ?Severe sepsis without septic shock ([name])-resolved as of 03/17/2021 ?Ruled out ?Sepsis-like physiology secondary to presentation with profound dehydration, hypoperfusion from dehydration related hypotension seizure activity ?Chest work-up was negative ? ? ? ? ? ?  ?Estimated body mass index is 21.55 kg/m? as calculated from the following: ?  Height as of this encounter: 5\' 8"  (1.727 m). ?  Weight as of this encounter: 64.3 kg. ? ?Code Status: full code. ?DVT Prophylaxis:  rivaroxaban (XARELTO) tablet 10 mg Start: 02/27/21 1000 ?rivaroxaban (XARELTO) tablet 10 mg  ? ?Level of Care: Level of care: Med-Surg ?Family Communication: none at bedside ? ?Disposition Plan:     Remains inpatient appropriate:  SNF placement.  ? ?Procedures:  ?None  ? ?Consultants:   ?none ? ?Antimicrobials:  ? ?Anti-infectives (From admission, onward)  ? ? Start     Dose/Rate Route Frequency Ordered Stop  ? 01/07/21 1500  vancomycin (VANCOREADY) IVPB 750 mg/150 mL  Status:  Discontinued       ? 750 mg ?150 mL/hr over 60 Minutes Intravenous Every 24 hours 01/06/21 1326 01/07/21 0802  ? 01/07/21 0802  vancomycin variable dose per unstable renal function (pharmacist dosing)  Status:  Discontinued       ?  Does not apply See admin instructions 01/07/21 0802 01/07/21 1000  ? 01/06/21 2200  cefTRIAXone (ROCEPHIN) 2 g in sodium chloride 0.9 % 100 mL IVPB  Status:  Discontinued       ? 2 g ?200 mL/hr over 30 Minutes Intravenous Every 12 hours 01/06/21 1635 01/07/21 1000  ? 01/06/21 1600  ceFEPIme (MAXIPIME) 2 g in sodium chloride 0.9 % 100 mL IVPB  Status:  Discontinued       ? 2 g ?200 mL/hr over 30 Minutes Intravenous Every 24 hours 01/05/21 1612 01/05/21 1721  ? 01/06/21 1415  vancomycin (VANCOREADY) IVPB 1500 mg/300 mL       ? 1,500 mg ?150 mL/hr over 120 Minutes Intravenous  Once 01/06/21 1326 01/06/21 1931  ? 01/05/21 1845  Ampicillin-Sulbactam (UNASYN) 3 g in sodium chloride 0.9 % 100 mL IVPB  Status:  Discontinued       ? 3 g ?200 mL/hr  over 30 Minutes Intravenous Every 12 hours 01/05/21 1754 01/06/21 1635  ? 01/05/21 1645  vancomycin (VANCOCIN) IVPB 1000 mg/200 mL premix       ? 1,000 mg ?200 mL/hr over 60 Minutes Intravenous  Once 01/05/21 1513 01/05/21 1752  ? 01/05/21 1612  vancomycin variable dose per unstable renal function (pharmacist dosing)  Status:  Discontinued       ?  Does not apply See admin instructions 01/05/21 1612 01/05/21 1721  ? 01/05/21 1515  ceFEPIme (MAXIPIME) 2 g in sodium chloride 0.9 % 100 mL IVPB       ? 2 g ?200 mL/hr over 30 Minutes Intravenous  Once 01/05/21 1513 01/05/21 1648  ? ?  ? ? ? ?Medications ? ?Scheduled Meds: ? aspirin  325 mg Oral Daily  ? clonazePAM  0.5 mg Oral TID  ? docusate sodium  100 mg Oral BID  ? feeding supplement  237 mL  Oral TID AC & HS  ? FLUoxetine  40 mg Oral Daily  ? folic acid  1 mg Oral Daily  ? levETIRAcetam  500 mg Oral BID  ? mouth rinse  15 mL Mouth Rinse BID  ? melatonin  10 mg Oral QHS  ? nicotine  21 mg Transdermal Daily  ? [START ON 04/11/2021] OLANZapine  5 mg Oral QHS  ? OLANZapine  7.5 mg Oral QHS  ? polyethylene glycol  17 g Oral Daily  ? risperiDONE  1 mg Oral QHS  ? rivaroxaban  10 mg Oral Daily  ? thiamine  250 mg Oral Daily  ? traZODone  100 mg Oral QHS  ? valproic acid  500 mg Oral BID  ? ?Continuous Infusions: ?PRN Meds:.acetaminophen, ibuprofen, OLANZapine, OLANZapine, ondansetron (ZOFRAN) IV, polyvinyl alcohol ? ? ? ?Subjective:  ? ?Steve [name] was seen and examined today.  No new complaints.  ? ?Objective:  ? ?Vitals:  ? 04/10/21 0003 04/10/21 [id number] 04/10/21 0721 04/10/21 1208  ?BP: 109/79 105/76 97/78 99/74   ?Pulse: 77 72 84 75  ?Resp: 18 20 18 20   ?Temp: (!) 97.5 ?F (36.4 ?C) 97.6 ?F (36.4 ?C) (!) 97.5 ?F (36.4 ?C) 98.2 ?F (36.8 ?C)  ?TempSrc: Oral Oral Oral Oral  ?SpO2: 98% 100% 100% 100%  ?Weight:      ?Height:      ? ? ?Intake/Output Summary (Last 24 hours) at 04/10/2021 1702 ?Last data filed at 04/10/2021 1010 ?Gross per 24 hour  ?Intake 480 ml  ?Output --  ?Net 480  ml  ? ?Filed Weights  ? 02/27/21 0501 03/05/21 0500 03/06/21 0500  ?Weight: 60.7 kg 57.9 kg 64.3 kg  ? ? ? ?Exam ?General exam: Appears calm and comfortable  ?Respiratory system: Clear to auscultation. Respirato

## 2021-04-11 DIAGNOSIS — E512 Wernicke's encephalopathy: Secondary | ICD-10-CM | POA: Diagnosis not present

## 2021-04-11 MED ORDER — GABAPENTIN 100 MG PO CAPS
100.0000 mg | ORAL_CAPSULE | Freq: Two times a day (BID) | ORAL | Status: DC
  Administered 2021-04-11 – 2021-04-19 (×18): 100 mg via ORAL
  Filled 2021-04-11 (×18): qty 1

## 2021-04-11 NOTE — Progress Notes (Signed)
? ? ? [facility] ?                                                                            ? ? ?Steve [name], is a 56 y.o. male, DOB - 08/12/1965, EQA:[id number] ?Admit date - 01/05/2021    ?Outpatient Primary MD for the patient is No primary care provider on file. ? ?LOS - 96  days ? ? ? ?Brief summary  ? ?Steve [name] is a 56 y.o. male with history of major depression, anxiety who was in a residential ETOH treatment center for last 5 months. He was brought to the ED on 12/20 from a residential ETOH treatment center for convalescence, combativeness and altered sensorium.  CT head negative for acute bleeding, but showed prominent fluid space at the left cerebral pontine angle raising possibility of epidermoid arachnoid cyst.  ? ? ?Assessment & Plan  ? ? ?Assessment and Plan: ?* Cognitive impairment  2/2 Wernicke's encephalopathy and alcoholic dementia ?Initial MRI revealed tiny acute infarct on the right but not significant enough to explain persistent cognitive and behavioral abnormalities. Also evidence of prior lacunar infarct right basal ganglia ?Cognitive screening revealed significant cognitive deficits ?Continue Prozac, Depakote, Klonopin and HS melatonin; on 3/23 I did increase Klonopin from BID to TID at patient request due to increasing anxiety ?Appreciate psychiatric team assistance.  Continue Risperdal and to taper and potentially discontinue Zyprexa.  Unclear at this juncture benefit of Depakote noting levels are subtherapeutic.  Consideration given to tapering and discontinuing this medication as well. ?Recent transaminitis improving and suspected to be secondary to Zyprexa ?Continue thiamine.  ? ?Constipation ?Resolved ? ?Eczema ?Resolved with use of topical cortisone cream. ? ?Protein calorie malnutrition ([name]) ?Patient able to feed self independently and is eating well ? ?Witnessed seizure ([name]) ?No apparent prior history of seizure disorder noting patient was not on AEDs prior to  admission ?Unclear if withdrawal seizure or related to other substances ?3/10 given recent behavioral changes attending felt EEG needed to be completed to rule out atypical seizure activity causing behavioral manifestations.   ?EEG unremarkable other than for moderate diffuse encephalopathy.  ?Continue Keppra at recommendation of neurology ?No seizure activity seen  ? ?Right ventricular dysfunction ?Echo 1/2 with EF 60 to 65%.  Cannot rule out a small PFO this finding determined to be clinically insignificant given the patient was asymptomatic. ?He appears compensated.  ? ?Physical deconditioning 2/2 ambulatory dysfunction ?Influenced by patient's poor cognition as well as prior heavy alcohol abuse and malnutrition before admission ?Therapy continues to recommend SNF placement due to level of assist required with ADLs and IADLs as well as ongoing poor cognition. ?Pt continues to have episodes of agitation and confusion, requiring redirection.  ? ?Skin abnormalities/perineal MASD ?Resolved ? ?Elevated transaminases at time of presentation-resolved as of 03/04/2021 ?Likely secondary to sepsis like physiology at presentation ?His AST and ALT were significantly elevated to over 700s on admission.  ?Re emergence mild elevation LFTs-likely due to Zyprexa and Depakote ?Due to oversedation Zyprexa dose decreased ?LFTs have normalized with decrease in Zyprexa dose. ?2/16 valproic acid level 55 ? ? ?Aspiration pneumonia ([name])- ?Completed the course of IV antibiotics.  ? ?Possible DVT (deep venous thrombosis) (  [name])-resolved as of 03/17/2021 ?Per critical care note, hemodialysis cath had a clot on it when it was removed.  UE duplex  12/31: no DVT.   ? ?Acute kidney injury with rhabdomyolysis-resolved as of 03/17/2021 ?Resolved ?Creatinine peaked at 7.48 on 12/26.  Nephrology consulted with initial plans to initiate dialysis including placement of [facility] but renal function gradually improved and dialysis not needed and TDC  removed ? ?Severe sepsis without septic shock ([name])-resolved as of 03/17/2021 ?Ruled out ?Sepsis-like physiology secondary to presentation with profound dehydration, hypoperfusion from dehydration related hypotension seizure activity ?Chest work-up was negative ? ? ?Peripheral Neuropathy;  ?- started him on neurontin .  ? ? ?  ?Estimated body mass index is 21.55 kg/m? as calculated from the following: ?  Height as of this encounter: 5\' 8"  (1.727 m). ?  Weight as of this encounter: 64.3 kg. ? ?Code Status: full code. ?DVT Prophylaxis:  rivaroxaban (XARELTO) tablet 10 mg Start: 02/27/21 1000 ?rivaroxaban (XARELTO) tablet 10 mg  ? ?Level of Care: Level of care: Med-Surg ?Family Communication: none at bedside ? ?Disposition Plan:     Remains inpatient appropriate:  SNF placement.  ? ?Procedures:  ?None  ? ?Consultants:   ?none ? ?Antimicrobials:  ? ?Anti-infectives (From admission, onward)  ? ? Start     Dose/Rate Route Frequency Ordered Stop  ? 01/07/21 1500  vancomycin (VANCOREADY) IVPB 750 mg/150 mL  Status:  Discontinued       ? 750 mg ?150 mL/hr over 60 Minutes Intravenous Every 24 hours 01/06/21 1326 01/07/21 0802  ? 01/07/21 0802  vancomycin variable dose per unstable renal function (pharmacist dosing)  Status:  Discontinued       ?  Does not apply See admin instructions 01/07/21 0802 01/07/21 1000  ? 01/06/21 2200  cefTRIAXone (ROCEPHIN) 2 g in sodium chloride 0.9 % 100 mL IVPB  Status:  Discontinued       ? 2 g ?200 mL/hr over 30 Minutes Intravenous Every 12 hours 01/06/21 1635 01/07/21 1000  ? 01/06/21 1600  ceFEPIme (MAXIPIME) 2 g in sodium chloride 0.9 % 100 mL IVPB  Status:  Discontinued       ? 2 g ?200 mL/hr over 30 Minutes Intravenous Every 24 hours 01/05/21 1612 01/05/21 1721  ? 01/06/21 1415  vancomycin (VANCOREADY) IVPB 1500 mg/300 mL       ? 1,500 mg ?150 mL/hr over 120 Minutes Intravenous  Once 01/06/21 1326 01/06/21 1931  ? 01/05/21 1845  Ampicillin-Sulbactam (UNASYN) 3 g in sodium chloride 0.9 % 100  mL IVPB  Status:  Discontinued       ? 3 g ?200 mL/hr over 30 Minutes Intravenous Every 12 hours 01/05/21 1754 01/06/21 1635  ? 01/05/21 1645  vancomycin (VANCOCIN) IVPB 1000 mg/200 mL premix       ? 1,000 mg ?200 mL/hr over 60 Minutes Intravenous  Once 01/05/21 1513 01/05/21 1752  ? 01/05/21 1612  vancomycin variable dose per unstable renal function (pharmacist dosing)  Status:  Discontinued       ?  Does not apply See admin instructions 01/05/21 1612 01/05/21 1721  ? 01/05/21 1515  ceFEPIme (MAXIPIME) 2 g in sodium chloride 0.9 % 100 mL IVPB       ? 2 g ?200 mL/hr over 30 Minutes Intravenous  Once 01/05/21 1513 01/05/21 1648  ? ?  ? ? ? ?Medications ? ?Scheduled Meds: ? aspirin  325 mg Oral Daily  ? clonazePAM  0.5 mg Oral TID  ? docusate  sodium  100 mg Oral BID  ? feeding supplement  237 mL Oral TID AC & HS  ? FLUoxetine  40 mg Oral Daily  ? folic acid  1 mg Oral Daily  ? gabapentin  100 mg Oral BID  ? levETIRAcetam  500 mg Oral BID  ? mouth rinse  15 mL Mouth Rinse BID  ? melatonin  10 mg Oral QHS  ? nicotine  21 mg Transdermal Daily  ? OLANZapine  5 mg Oral QHS  ? polyethylene glycol  17 g Oral Daily  ? risperiDONE  1 mg Oral QHS  ? rivaroxaban  10 mg Oral Daily  ? thiamine  250 mg Oral Daily  ? traZODone  100 mg Oral QHS  ? valproic acid  500 mg Oral BID  ? ?Continuous Infusions: ?PRN Meds:.acetaminophen, ibuprofen, OLANZapine, OLANZapine, ondansetron (ZOFRAN) IV, polyvinyl alcohol ? ? ? ?Subjective:  ? ?Steve [name] was seen and examined today.  Sleeping comfortably.  ? ?Objective:  ? ?Vitals:  ? 04/10/21 0721 04/10/21 1208 04/10/21 1953 04/11/21 0417  ?BP: 97/78 99/74 105/74 101/74  ?Pulse: 84 75 97 83  ?Resp: 18 20 18 18   ?Temp: (!) 97.5 ?F (36.4 ?C) 98.2 ?F (36.8 ?C) (!) 97.5 ?F (36.4 ?C) (!) 97.5 ?F (36.4 ?C)  ?TempSrc: Oral Oral Oral Oral  ?SpO2: 100% 100% 95% 100%  ?Weight:      ?Height:      ? ? ?Intake/Output Summary (Last 24 hours) at 04/11/2021 1353 ?Last data filed at 04/11/2021 0900 ?Gross per 24  hour  ?Intake 480 ml  ?Output --  ?Net 480 ml  ? ? ?Filed Weights  ? 02/27/21 0501 03/05/21 0500 03/06/21 0500  ?Weight: 60.7 kg 57.9 kg 64.3 kg  ? ? ? ?Exam ?General exam: Appears calm and comfortable  ?Respiratory sy

## 2021-04-11 NOTE — Progress Notes (Signed)
Patient has been agitated, hallucinating, he states "I heard Glenard Haring running in the hallway and asking - Daddy where are you?".  Patient said he attempted to go where he saw the girl was running but he was stopped by the staff at the front desk.  Patient is asking if he can call security to make sure the girl is safe.  Patient said that Glenard Haring is his niece who lives with her Elenor Legato now.  Redirected and reoriented patient.  Patient remains cooperative, anxious, with visible sweat on face.  Needs addressed. ?

## 2021-04-11 NOTE — Progress Notes (Signed)
Patient received awake, requires reminders on safety and fall precautions, patient is non-compliant.  Will closely monitor. ?

## 2021-04-12 DIAGNOSIS — E512 Wernicke's encephalopathy: Secondary | ICD-10-CM | POA: Diagnosis not present

## 2021-04-12 MED ORDER — RISPERIDONE 0.5 MG PO TBDP
1.0000 mg | ORAL_TABLET | Freq: Two times a day (BID) | ORAL | Status: DC
  Administered 2021-04-12 – 2021-05-08 (×52): 1 mg via ORAL
  Filled 2021-04-12 (×18): qty 2
  Filled 2021-04-12: qty 1
  Filled 2021-04-12 (×10): qty 2
  Filled 2021-04-12: qty 1
  Filled 2021-04-12 (×23): qty 2

## 2021-04-12 MED ORDER — RISPERIDONE 1 MG PO TBDP: 1.0000 mg | ORAL_TABLET | Freq: Every day | ORAL | Status: DC

## 2021-04-12 NOTE — Consult Note (Signed)
?[facility] Followup Face-to-Face Psychiatric Evaluation ? ? ?Service Date: April 12, 2021 ?LOS:  LOS: 97 days  ? ? ?Assessment  ?Dieter Huyck is a 56 y.o. male admitted medically for 01/05/2021  1:14 PM for full body seizure, AMS and combativeness. He carries the psychiatric diagnoses of MDD and and has a past medical history of Wernicke's encephalopathy and alcoholic dementia .Psychiatry was consulted for agitation by Lala Lund, MD.  ? ? 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 Information on premorbid diagnoses is sparse; at minimum included severe AUD. Outpatient psychotropic medications include Prozac, Trazodone and gabapentin and historically he has had an unknown response to these medications. Focus of treatment in the hospital has been on controlling behaviors and impulsivity; this is difficult given ongoing confabulations (which will not respond to medications and are not being specifically targeted) and pt's relative inability to retain information (such as his need to stay in bed due to being a fall risk). This will likely become easier as he works with PT and is afforded more mobility.  Please see plan below for detailed recommendations. Current over-arching treatment is downtitration of olanzapine due to poor response at high doses, anticholinergic side effect profile (and outside chance it is contributing to transaminitis) while introducing risperidone (please see daily plan for changes). Patient may not require direct equivalent dose of Risperdal, as Zyprexa's anticholinergic effects could be contributing to presentation and Risperdal is less likely to cause same side effects. Would like to increase depakote if LFTs improve as he seemed to have improved behavior at a higher dose; if there is no improvement early in week of 3/13 will likely need to discontinue this medication.  ? ?3/17: Patient  continues to do well without behavior episodes. Patient is ambulating more and communicating his needs. Patient appears to have occasional hallucinations but they appear comforting to patient. Patient transmisnitis appears to be improving with the reduction in Zyprexa and patient behavior is stable.  ? ?3/22: Patient is continuing to do well and has not had any behavioral episodes.  Per sitter during the night patient walked a few times around the unit at 2 AM but had no outbursts.  Patient continues to confabulate today focusing on getting insurance so he can get a rehab bed faster but denies any outright AVH.  As he is not having any behavioral outbursts we will attempt to decrease his Zyprexa and monitor his response to this.  We will not recommend any other medication changes at this time.  ? ? ? ?3/27: Patient did have an incident over the weekend where he confabulated his niece was in the hospital and calling out for help which got him agitated, however, now he no longer thinks this.  Since he admits he has been calmer with the reduction in the Zyprexa we will continue to taper it off making a further reduction in a few days as his first dose at 5 mg was last night.  Since he has had a good response to the Risperdal we will also start a morning dose as well for better mood stability throughout the day.  ? ? ?Diagnoses:  ?Active Hospital problems: ?Principal Problem: ?  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  Skin abnormalities/perineal MASD ?  Physical deconditioning 2/2 ambulatory dysfunction ?  Right ventricular dysfunction ?  Witnessed seizure ([place]) ?  Protein calorie malnutrition ([place]) ?  Ambulatory dysfunction ?  Eczema ?  Pressure injury of skin ?  Constipation ?  ? ? ?Plan  ?## Safety and Observation Level:  ?- Based on my clinical evaluation, I estimate the patient to be at moderate risk of self harm in the current setting ?- At this time, we  recommend a close level of observation. This decision is based on my review of the chart including patient's history and current presentation, interview of the patient, mental status examination, and consideration of suicide risk including evaluating suicidal ideation, plan, intent, suicidal or self-harm behaviors, risk factors, and protective factors. This judgment is based on our ability to directly address suicide risk, implement suicide prevention strategies and develop a safety plan while the patient is in the clinical setting. Please contact our team if there is a concern that risk level has changed. ? ? ?## Medications:  ?-Increase frequency of Risperdal to 1 mg BID QHS ?-Continue klonopin 0.5mg  BID ?-Continue Prozac 40mg  ?-Continue Melatonin 10mg  QHS ?-Continue Trazodone 100mg  QHS  ?-Continue Zyprexa to 5 mg QHS ?-Continue Depakene 500mg  BID ?-Depakote lvl and CMP 3/17 ?-May continue Zyprexa 5mg  q8h PRN for agitation ? ?## Medical Decision Making Capacity:  ?Not formally assessed ? ?## Further Work-up:  ?-- Per Primary ? ? ? ?-- most recent EKG on 3/8 had QtC of 462 ?-- Pertinent labwork reviewed earlier this admission includes:  ?Depakote lvl (3/17): 35 ? ?## Disposition:  ?-- Per Primary ? ?## Behavioral / Environmental:  ?1: Provide appropriate lighting and clear signage; a clock and calendar should be easily visible to the patient. ?2: Continue to provide activities for patient ( coloring, word search, book) ?3: Reorient the patient to person, place, time and situation on each encounter.  ?4: Minimize use of Antipsychotics PRN. Patient benefits from constant observation by a sitter. ? ?##Legal Status ? ? ?Thank you for this consult request. Recommendations have been communicated to the primary team.  We will continue to follow at this time.  ? ?Briant Cedar, MD ? ? ?Followup history  ?Relevant Aspects of Hospital Course:  ?Admitted on 01/05/2021 for Korsakoff. ? ?Patient Report:  ?On assessment  today patient reports he slept "like a rock" last night and it was good sleep.  He reports his appetite continues to do well.  He reports no SI, HI, or AVH.  He reports no issues with his medications.   ? ?When asked how his weekend went he stated that he worked Monday- Thursday last week and then had 3 days off and it was good.  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   ? ?Asked what happened over the weekend and he reports his niece was staying in the hospital with him and that when he woke up in the night he couldn't find her.  He states he heard her calling out for him down the hallway and so started looking for her and states he was asking everyone if they had seen her.  He states that her uncle had come to get her so he knows she is not here in the hospital. ? ?When asked about discharge she states he had been considering staying with his sister because she lives nearby in [place].  When asked to clarify he states [facility] and she is only 35 miles away.  When asked what city he was currently  in he stated a city in [place].  Oriented him that he was in [facility] in [place]. ? ?Discussed with him that we were making further medication changes.  When discussing the decrease in Zyprexa he states he feels like that is been helpful for him because he states he has been feeling calmer since last week.  He reports no other concerns at present. ? ? ? ?Collateral information:  ?None ? ?Psychiatric History:  ?MDD, anxiety.  ?Vistaril 50 mg every 4 hours needed for anxiety, Prozac 40 mg daily and trazodone 50 mg at bedtime.  ? ?Family psych history: Not known per EMR ? ? ?Social History:  ? ? ?Tobacco use: Defer ?Alcohol use: Defer ?Drug use: Defer ? ?Family History:  ? ?The patient's family history is not on file. ? ?Medical History: ?History reviewed. No pertinent past medical  history. ? ?Surgical History: ?History reviewed. No pertinent surgical history. ? ?Medications:  ? ?Current Facility-Administered Medications:  ?  acetaminophen (TYLENOL) tablet 650 mg, 650 mg, Oral, Q4H PRN, Bobbye Riggs

## 2021-04-12 NOTE — Progress Notes (Signed)
A. Lissa Merlin, NP okay with patient not having IV.  No IV meds ordered. ?

## 2021-04-12 NOTE — Progress Notes (Signed)
?Progress Note ? ? ?[facility]Dest[facility] Manor [name] DOB: 1965/07/08 DOA: 01/05/2021     97 ?DOS: the patient was seen and examined on 04/12/2021 ?  ?Brief hospital course: ?Steve [name] is a 56 y.o. male with history of major depression, anxiety who was in a residential ETOH treatment center for last 5 months. He was brought to the ED on 12/20 from a residential ETOH treatment center for convalescence, combativeness and altered sensorium.  Per collateral history, patient was not acting typical for last few days. ? ?In the ED, patient was confused, combative. Labs showed AKI with creatinine of 3.27, rhabdomyolysis, abnormal LFTs, elevated WBC count at 23.7 and lactic acid level at 7.6. 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 Blood culture was sent, empiric antibiotic started.  IV fluid resuscitation started and patient was initially admitted to ICU. ?12/21, underwent LP which showed staph in CSF, felt to be contaminant.  EEG did not show any seizures ?12/24, HD catheter was placed but patient did not require HD as creatinine started to improve ?12/31, with clinical improvement, patient was transferred out of ICU to [facility]. ?1/18 Cognitive evaluations: 2 on the SLUMS, a 9 on the MMSE, and a 0 on medi-cog and short blessed test ? ?Assessment and Plan: ?* Cognitive impairment  2/2 Wernicke's encephalopathy and alcoholic dementia ?Initial MRI revealed tiny acute infarct on the right but not significant enough to explain persistent cognitive and behavioral abnormalities. Also evidence of prior lacunar infarct right basal ganglia ?Cognitive screening revealed significant cognitive deficits ?Continue Prozac, Depakote, Klonopin and HS melatonin; on 3/23 I did increase Klonopin from BID to TID at patient request due to increasing anxiety ?Appreciate psychiatric team assistance.  Continue Risperdal and  to taper/consider discontinue Risperdal ?Likely Depakote would also help so plan is to continue to increase dose to therapeutic levels per psychiatry team ?Continue thiamine-previously had been given high-dose IV thiamine earlier in the hospitalization with some improvement in cognition ? ?After discharge recommendations: PLEASE NOTE THAT THIS PT'S REGIMEN OF PSYCHOTROPIC MEDS CAN NEVER BE WEANED AND DC'D DUE TO SEVERITY OF HIS WERNICKE'S DEMENTIA ? ? ? ? ?Witnessed seizure ([name]) ?No apparent prior history of seizure disorder noting patient was not on AEDs prior to admission ?Unclear if withdrawal seizure or related to other substances ?3/10 given recent behavioral changes attending felt EEG needed to be completed to rule out atypical seizure activity causing behavioral manifestations.  EEG unremarkable other than for moderate diffuse encephalopathy.  No seizure activity. ?Continue Keppra at recommendation of neurology ? ?Acute kidney injury with rhabdomyolysis-resolved as of 03/17/2021 ?Resolved ?Creatinine peaked at 7.48 on 12/26.  Nephrology consulted with initial plans to initiate dialysis including placement of [facility] but renal function gradually improved and dialysis not needed and TDC removed ? ?Eczema ?Resolved with use of topical cortisone cream ?  ?2/27 ? ? ?2/27 ? ? ?3/5 ? ? ? ? ?Constipation ?Resolved ? ?Aspiration pneumonia ([name])-resolved as of 03/04/2021 ?Completed a course of Zosyn ? ?Possible DVT (deep venous thrombosis) ([name])-resolved as of 03/17/2021 ?Per critical care note, hemodialysis cath had a clot on it when it was removed.  UE duplex  12/31: no DVT.   ? ?Right ventricular dysfunction ?Echo 1/2 with EF 60 to 65%.  Cannot rule out a small PFO this finding determined to be clinically insignificant given the patient was asymptomatic ? ?Elevated transaminases at time of presentation-resolved as of  03/04/2021 ?Likely secondary to sepsis like physiology at presentation ?His AST and ALT were significantly  elevated to over 700s on admission.  ?Re emergence mild elevation LFTs-likely due to Zyprexa and Depakote ?Due to oversedation Zyprexa dose decreased ?LFTs have normalized with decrease in Zyprexa dose. ?2/16 valproic acid level 55 ? ? ?Physical deconditioning 2/2 ambulatory dysfunction ?Patient now ambulating without difficulty ? ?A physical therapy consult is indicated based on the patient?s mobility assessment. ? ? ?Mobility Assessment (last 72 hours)   ? ? Mobility Assessment   ? ? Row Name 03/15/21 1700 03/15/21 1200 03/14/21 1200 03/14/21 0952 03/14/21 0830  ? Does patient have an order for bedrest or is patient medically unstable -- No - Continue assessment -- No - Continue assessment No - Continue assessment  ? What is the highest level of mobility based on the progressive mobility assessment? Level 4 (Walks with assist in room) - Balance while marching in place and cannot step forward and back - Complete Level 4 (Walks with assist in room) - Balance while marching in place and cannot step forward and back - Complete -- Level 4 (Walks with assist in room) - Balance while marching in place and cannot step forward and back - Complete Level 4 (Walks with assist in room) - Balance while marching in place and cannot step forward and back - Complete  ? Is the above level different from baseline mobility prior to current illness? -- Yes - Recommend PT order -- Yes - Recommend PT order Yes - Recommend PT order  ? ? Row Name 03/13/21 2034 03/13/21 0800  ?  ?  ?  ? Does patient have an order for bedrest or is patient medically unstable No - Continue assessment No - Continue assessment     ? What is the highest level of mobility based on the progressive mobility assessment? Level 4 (Walks with assist in room) - Balance while marching in place and cannot step forward and back - Complete Level 2 (Chairfast) - Balance while sitting on edge of bed and cannot stand     ? Is the above level different from baseline mobility  prior to current illness? Yes - Recommend PT order Yes - Recommend PT order     ? ?  ?  ? ?  ? ? ? ? ?Severe sepsis without septic shock ([name])-resolved as of 03/17/2021 ?Ruled out ?Sepsis-like physiology secondary to presentation with profound dehydration, hypoperfusion from dehydration related hypotension seizure activity ?Chest work-up was negative ? ?Skin abnormalities/perineal MASD ?Resolved ? ?Protein calorie malnutrition ([name]) ?Patient able to feed self independently and is eating well ? ? ? ? ? ? ? ?Subjective:  ?Remains confused and confabulatory.  Less restless and anxious during my interview today.  Sitter at bedside. ? ?Physical Exam: ?Vitals:  ? 04/11/21 1500 04/11/21 1941 04/12/21 0328 04/12/21 0755  ?BP: (!) 84/58 100/74 99/75 109/74  ?Pulse: 86 91 84 81  ?Resp: 17 20 18 19   ?Temp: 98.1 ?F (36.7 ?C) 97.9 ?F (36.6 ?C) 97.9 ?F (36.6 ?C) 97.6 ?F (36.4 ?C)  ?TempSrc: Oral Oral  Oral  ?SpO2: 98% 99% 100% 100%  ?Weight:      ?Height:      ? ?Constitutional: NAD, pleasant and common not as restless as 3/23 ?Respiratory: Stable on room air, anterior lung sounds clear to auscultation.  Normal respiratory effort and pattern ?Cardiovascular: Normotensive, S1-S2, no peripheral edema.  Regular pulse. ?Abdomen: no tenderness, no masses palpated. Bowel sounds positive. LBM 3/26 ?Neurologic:  CN 2-12 grossly intact. Sensation intact, Strength 5/5 x all 4 extremities.  Able to ambulate independently without gait disturbance ?Psychiatric: Oriented x3.  Short-term memory not intact and continues to confabulate and that regards he is confused  ? ?There are no new results to review at this time. ? ?Family Communication:  ?Patient only ? ?Disposition: ?Status is: Inpatient ?Remains inpatient appropriate because:  ?Unsafe discharge plan secondary to severe cognitive impairment requiring SNF placement.  Currently lacks funding. ? ? ? ? ? DVT Prophylaxis  .Rivaroxaban (xarelto) tablet 10 mg , Rivaroxaban (xarelto) tablet 10 mg   ? ?Planned Discharge Destination:  ?Skilled nursing facility for custodial care ? ?Medically stable: ?No-still titrating behavioral meds ? ?COVID vaccination status:  ?Unknown ? ?Consultants: ?Nephrology

## 2021-04-12 NOTE — Progress Notes (Signed)
Physical Therapy Treatment ?Patient Details ?Name: Steve [name] ?MRN: [id number] ?DOB: Mar 20, 1965 ?Today's Date: 04/12/2021 ? ? ?History of Present Illness Pt is a 56 y.o. male admitted from group home for convalescence, combativeness and altered sensorium on 01/05/21 with witnessed seizure, AMS. 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 EEG unremarkable. LP on 12/21 showed staph in CSF, felt to be contaminant. Workup for AKI, rhabdomyolysis. Course complicated by c/o L foot pain; imaging negative for acute injury. Pt remains hospitalized due to difficult to place. Pt with psych consult who felt no need for inpt psych. No PMH in chart. ? ?  ?PT Comments  ? ? Pt continues to be supervision, near mod I for mobility. Attempted to find a pair of shoes for pt to sup[ort L foot and minimize pain however unsuccessful. Tried the CAM boot again however pt more unsteady ambulating due to leg length discrepancy. One of the OTs on staff said he would bring in a 9.5 shoe he has at home for patient to try. Acute PT to cont to follow however pt is nearing completion of PT goals. Will continue to work on acquiring a pair of shoes for patient. ?   ?Recommendations for follow up therapy are one component of a multi-disciplinary discharge planning process, led by the attending physician.  Recommendations may be updated based on patient status, additional functional criteria and insurance authorization. ? ?Follow Up Recommendations ? Long-term institutional care without follow-up therapy ?  ?  ?Assistance Recommended at Discharge Intermittent Supervision/Assistance  ?Patient can return home with the following Direct supervision/assist for medications management;Direct supervision/assist for financial management;Assist for transportation ?  ?Equipment Recommendations ? None recommended by PT  ?  ?Recommendations for  Other Services   ? ? ?  ?Precautions / Restrictions Precautions ?Precautions: Fall ?Precaution Comments: has a sitter ?Restrictions ?Weight Bearing Restrictions: No ?LLE Weight Bearing: Weight bearing as tolerated ?Other Position/Activity Restrictions: CAM boot not needed per ortho note  ?  ? ?Mobility ? Bed Mobility ?Overal bed mobility: Independent ?Bed Mobility: Supine to Sit, Sit to Supine ?  ?  ?Supine to sit: HOB elevated, Independent ?Sit to supine: Independent ?  ?General bed mobility comments: pt able to get in/out of bed with HOB flat without difficulty ?  ? ?Transfers ?Overall transfer level: Modified independent ?Equipment used: None ?Transfers: Sit to/from Stand ?Sit to Stand: Modified independent (Device/Increase time) ?  ?  ?  ?  ?  ?General transfer comment: no episode of LOB ?  ? ?Ambulation/Gait ?Ambulation/Gait assistance: Min guard ?Gait Distance (Feet): 500 Feet ?Assistive device: None ?Gait Pattern/deviations: Step-through pattern, Antalgic ?Gait velocity: decr ?  ?  ?General Gait Details: trialed CAM boot on L foot to assess if it assisted wtih pain, however due to pt not having regular shoes pt now with leg length discrepency and throwing balance off requiring min guard assist, pt also with L knee hyperextension ? ? ?Stairs ?  ?  ?  ?  ?  ? ? ?Wheelchair Mobility ?  ? ?Modified Rankin (Stroke Patients Only) ?  ? ? ?  ?Balance Overall balance assessment: Needs assistance ?Sitting-balance support: Feet supported, No upper extremity supported ?Sitting balance-Leahy Scale: Normal ?Sitting balance - Comments: able to sit on EOB without assitance ?  ?Standing balance support: No upper extremity supported, During functional activity ?Standing balance-Leahy Scale: Good ?Standing balance comment: supervision for safety, some instances  of wobbles ?  ?  ?  ?  ?  ?  ?  ?  ?  ?  ?  ?  ? ?  ?Cognition Arousal/Alertness: Awake/alert ?Behavior During Therapy: [facility] for tasks assessed/performed,  Impulsive ?Overall Cognitive Status: Impaired/Different from baseline ?Area of Impairment: Orientation, Attention, Memory, Following commands, Safety/judgement, Awareness, Problem solving ?  ?  ?  ?  ?  ?  ?  ?  ?Orientation Level: Situation, Disoriented to ?Current Attention Level: Selective ?Memory: Decreased recall of precautions, Decreased short-term memory ?Following Commands: Follows one step commands with increased time, Follows one step commands inconsistently ?Safety/Judgement: Decreased awareness of safety, Decreased awareness of deficits ?Awareness: Emergent ?Problem Solving: Requires verbal cues ?General Comments: pt pleasant and cooperative this date able to states his shoe size and problem solve why the shoes wouldn't fit, ie. to thick of socks, my Left foot is swollen. pt also confabulating about brother coming to the hospital about 430-5 today ?  ?  ? ?  ?Exercises   ? ?  ?General Comments General comments (skin integrity, edema, etc.): put L cam boot back to gether, pt denies it improving his pain significantly. pt able to take self to bathroom without assist ?  ?  ? ?Pertinent Vitals/Pain Pain Assessment ?Pain Assessment: Faces ?Faces Pain Scale: Hurts a little bit ?Pain Location: L foot ?Pain Descriptors / Indicators:  (antalgia with weight bearing) ?Pain Intervention(s): Monitored during session  ? ? ?Home Living   ?  ?  ?  ?  ?  ?  ?  ?  ?  ?   ?  ?Prior Function    ?  ?  ?   ? ?PT Goals (current goals can now be found in the care plan section) Acute Rehab PT Goals ?PT Goal Formulation: Patient unable to participate in goal setting ?Time For Goal Achievement: 04/19/21 ?Potential to Achieve Goals: Good ?Progress towards PT goals: Progressing toward goals ? ?  ?Frequency ? ? ? Min 2X/week ? ? ? ?  ?PT Plan Current plan remains appropriate  ? ? ?Co-evaluation   ?  ?  ?  ?  ? ?  ?AM-PAC PT "6 Clicks" Mobility   ?Outcome Measure ? Help needed turning from your back to your side while in a flat bed  without using bedrails?: None ?Help needed moving from lying on your back to sitting on the side of a flat bed without using bedrails?: None ?Help needed moving to and from a bed to a chair (including a wheelchair)?: None ?Help needed standing up from a chair using your arms (e.g., wheelchair or bedside chair)?: None ?Help needed to walk in hospital room?: A Little ?Help needed climbing 3-5 steps with a railing? : A Little ?6 Click Score: 22 ? ?  ?End of Session Equipment Utilized During Treatment: Gait belt ?Activity Tolerance: Patient tolerated treatment well ?Patient left: in bed;with bed alarm set;with call bell/phone within reach;with nursing/sitter in room ?Nurse Communication: Mobility status ?PT Visit Diagnosis: Other abnormalities of gait and mobility (R26.89);Other symptoms and signs involving the nervous system (R29.898) ?Pain - Right/Left: Left ?Pain - part of body: Leg ?  ? ? ?Time: 1234-1300 ?PT Time Calculation (min) (ACUTE ONLY): 26 min ? ?Charges:  $Gait Training: 8-22 mins ?$Therapeutic Activity: 8-22 mins          ?          ? ?Kittie Plater, PT, DPT ?Acute Rehabilitation Services ?Pager #: [phone] ?Office #: [phone] ? ? ? ?  Wildon Cuevas M Milton Sagona ?04/12/2021, 1:36 PM ? ?

## 2021-04-13 NOTE — Progress Notes (Addendum)
2:35pm: ?CSW spoke with [name] of [facility] to provide with her an update. ? ?12pm: ?CSW sent clinicals to [facility] for review. ? ?9am: ?CSW attempted to reach [place] at [facility] without success - a voicemail was left requesting a return call. ? ?Edwin Dada, MSW, LCSW ?Transitions of Care  Clinical Social Worker II ?[phone] ? ?

## 2021-04-13 NOTE — Progress Notes (Signed)
Occupational Therapy Treatment ?Patient Details ?Name: Steve [name] ?MRN: [id number] ?DOB: Dec 20, 1965 ?Today's Date: 04/13/2021 ? ? ?History of present illness Pt is a 56 y.o. male admitted from group home for convalescence, combativeness and altered sensorium on 01/05/21 with witnessed seizure, AMS. 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 EEG unremarkable. LP on 12/21 showed staph in CSF, felt to be contaminant. Workup for AKI, rhabdomyolysis. Course complicated by c/o L foot pain; imaging negative for acute injury. Pt remains hospitalized due to difficult to place. Pt with psych consult who felt no need for inpt psych. No PMH in chart. ?  ?OT comments ? Patient was provided shoes and socks this session and patient was able to donn socks with setup seated on EOB and required mod assist to donn shoes.  Patient performed mobility in hallway to address safety and comfort while wearing shoes.  Patient states he continues to have pain at left foot.  Patient provided LH shoehorn to increase independence with donning shoes and instructed on use. Patient would benefit from further education on [facility] shoehorn use. Acute OT to continue to follow.   ? ?Recommendations for follow up therapy are one component of a multi-disciplinary discharge planning process, led by the attending physician.  Recommendations may be updated based on patient status, additional functional criteria and insurance authorization. ?   ?Follow Up Recommendations ? Skilled nursing-short term rehab (<3 hours/day)  ?  ?Assistance Recommended at Discharge Frequent or constant Supervision/Assistance  ?Patient can return home with the following ? A little help with walking and/or transfers;A little help with bathing/dressing/bathroom;Assistance with cooking/housework;Assist for transportation;Help with stairs or ramp for entrance ?   ?Equipment Recommendations ? None recommended by OT  ?  ?Recommendations for Other Services   ? ?  ?Precautions / Restrictions Precautions ?Precautions: Fall ?Precaution Comments: has a sitter ?Restrictions ?Weight Bearing Restrictions: No ?LLE Weight Bearing: Weight bearing as tolerated ?Other Position/Activity Restrictions: CAM boot not needed per ortho note  ? ? ?  ? ?Mobility Bed Mobility ?Overal bed mobility: Independent ?Bed Mobility: Supine to Sit ?  ?  ?Supine to sit: Independent ?  ?  ?General bed mobility comments: able to get to EOB without assistance ?  ? ?Transfers ?Overall transfer level: Modified independent ?Equipment used: None ?Transfers: Sit to/from Stand ?Sit to Stand: Modified independent (Device/Increase time) ?  ?  ?  ?  ?  ?General transfer comment: close min guard due to ambulating with shoes ?  ?  ?Balance Overall balance assessment: Needs assistance ?Sitting-balance support: Feet supported, No upper extremity supported ?Sitting balance-Leahy Scale: Normal ?Sitting balance - Comments: able to sit on EOB without assitance and address LB dressing ?  ?Standing balance support: No upper extremity supported, During functional activity ?Standing balance-Leahy Scale: Good ?Standing balance comment: supervison to min guard due to patient unfamilar with wearing shoes ?  ?  ?  ?  ?  ?  ?  ?  ?  ?  ?  ?   ? ?ADL either performed or assessed with clinical judgement  ? ?ADL   ?  ?  ?  ?  ?  ?  ?  ?  ?  ?  ?Lower Body Dressing: Moderate assistance;With adaptive equipment ?Lower Body Dressing Details (indicate cue type and reason): patient able to doff and donn socks but required assistance to donn left shoe.  Education on [facility] shoehorn  with further education required ?  ?  ?  ?  ?  ?  ?  ?General ADL Comments: AE training with LH shoehorn ?  ? ?Extremity/Trunk Assessment   ?  ?  ?  ?  ?  ? ?Vision   ?  ?  ?Perception   ?  ?Praxis   ?  ? ?Cognition Arousal/Alertness: Awake/alert ?Behavior During Therapy:  [facility] for tasks assessed/performed, Impulsive ?Overall Cognitive Status: Impaired/Different from baseline ?Area of Impairment: Orientation, Attention, Memory, Following commands, Safety/judgement, Awareness, Problem solving ?  ?  ?  ?  ?  ?  ?  ?  ?Orientation Level: Situation, Disoriented to ?Current Attention Level: Selective ?Memory: Decreased recall of precautions, Decreased short-term memory ?Following Commands: Follows one step commands with increased time, Follows one step commands inconsistently ?Safety/Judgement: Decreased awareness of safety, Decreased awareness of deficits ?Awareness: Emergent ?Problem Solving: Requires verbal cues ?General Comments: was able to give correct date but believed it was Saturday ?  ?  ?   ?Exercises   ? ?  ?Shoulder Instructions   ? ? ?  ?General Comments    ? ? ?Pertinent Vitals/ Pain       Pain Assessment ?Pain Assessment: Faces ?Faces Pain Scale: Hurts little more ?Pain Location: L foot ?Pain Descriptors / Indicators: Guarding ?Pain Intervention(s): Monitored during session ? ?Home Living   ?  ?  ?  ?  ?  ?  ?  ?  ?  ?  ?  ?  ?  ?  ?  ?  ?  ?  ? ?  ?Prior Functioning/Environment    ?  ?  ?  ?   ? ?Frequency ? Min 1X/week  ? ? ? ? ?  ?Progress Toward Goals ? ?OT Goals(current goals can now be found in the care plan section) ? Progress towards OT goals: Progressing toward goals ? ?Acute Rehab OT Goals ?Patient Stated Goal: get out of hospital ?OT Goal Formulation: With patient ?Time For Goal Achievement: 04/12/21 ?Potential to Achieve Goals: Good ?ADL Goals ?Pt Will Perform Grooming: with supervision;standing ?Pt Will Perform Lower Body Bathing: with set-up;sit to/from stand ?Pt Will Perform Lower Body Dressing: with set-up;sitting/lateral leans ?Pt Will Transfer to Toilet: with supervision;ambulating ?Pt Will Perform Toileting - Clothing Manipulation and hygiene: with supervision;sitting/lateral leans ?Additional ADL Goal #1: Pt will demonstrate increased attention to task,  requiring less than 3 vc during a 5 min ADL task  ?Plan Discharge plan remains appropriate   ? ?Co-evaluation ? ? ?   ?  ?  ?  ?  ? ?  ?AM-PAC OT "6 Clicks" Daily Activity     ?Outcome Measure ? ? Help from another person eating meals?: None ?Help from another person taking care of personal grooming?: A Little ?Help from another person toileting, which includes using toliet, bedpan, or urinal?: A Little ?Help from another person bathing (including washing, rinsing, drying)?: A Little ?Help from another person to put on and taking off regular upper body clothing?: A Little ?Help from another person to put on and taking off regular lower body clothing?: A Little ?6 Click Score: 19 ? ?  ?End of Session Equipment Utilized During Treatment: Gait belt ? ?OT Visit Diagnosis: Unsteadiness on feet (R26.81);Muscle weakness (generalized) (M62.81);Low vision, both eyes (H54.2);Ataxia, unspecified (R27.0);Pain ?Pain - Right/Left: Left ?Pain - part of body: Ankle and joints of foot ?  ?Activity Tolerance Patient tolerated treatment well ?  ?Patient Left in bed;with nursing/sitter in room;with call  bell/phone within reach ?  ?Nurse Communication Mobility status ?  ? ?   ? ?Time: [id number] ?OT Time Calculation (min): 23 min ? ?Charges: OT General Charges ?$OT Visit: 1 Visit ?OT Treatments ?$Self Care/Home Management : 8-22 mins ?$Therapeutic Activity: 8-22 mins ? ?Alfonse Flavorsick Chesney Klimaszewski, OTA ?Acute Rehabilitation Services  ?Pager [phone] ?Office [phone] ? ? ?Siriah Treat Jeannett SeniorL Ashtan Laton ?04/13/2021, 8:18 AM ?

## 2021-04-13 NOTE — Plan of Care (Signed)
No behavioral concerns. Pt has walked the hallway 4 times today. C/o left foot pain but refused PRN meds. Left foot is swollen compared to his right foot. Sitter at bedside. ? ?Problem: Health Behavior/Discharge Planning: ?Goal: Ability to manage health-related needs will improve ?Outcome: Progressing ?  ?Problem: Clinical Measurements: ?Goal: Ability to maintain clinical measurements within normal limits will improve ?Outcome: Progressing ?Goal: Will remain free from infection ?Outcome: Progressing ?Goal: Diagnostic test results will improve ?Outcome: Progressing ?Goal: Respiratory complications will improve ?Outcome: Progressing ?Goal: Cardiovascular complication will be avoided ?Outcome: Progressing ?  ?Problem: Activity: ?Goal: Risk for activity intolerance will decrease ?Outcome: Progressing ?  ?Problem: Nutrition: ?Goal: Adequate nutrition will be maintained ?Outcome: Progressing ?  ?Problem: Coping: ?Goal: Level of anxiety will decrease ?Outcome: Progressing ?  ?Problem: Elimination: ?Goal: Will not experience complications related to bowel motility ?Outcome: Progressing ?Goal: Will not experience complications related to urinary retention ?Outcome: Progressing ?  ?Problem: Pain Managment: ?Goal: General experience of comfort will improve ?Outcome: Progressing ?  ?Problem: Safety: ?Goal: Ability to remain free from injury will improve ?Outcome: Progressing ?  ?Problem: Skin Integrity: ?Goal: Risk for impaired skin integrity will decrease ?Outcome: Progressing ?  ?Problem: Education: ?Goal: Expressions of having a comfortable level of knowledge regarding the disease process will increase ?Outcome: Progressing ?  ?Problem: Coping: ?Goal: Ability to adjust to condition or change in health will improve ?Outcome: Progressing ?Goal: Ability to identify appropriate support needs will improve ?Outcome: Progressing ?  ?Problem: Health Behavior/Discharge Planning: ?Goal: Compliance with prescribed medication regimen  will improve ?Outcome: Progressing ?  ?Problem: Medication: ?Goal: Risk for medication side effects will decrease ?Outcome: Progressing ?  ?Problem: Clinical Measurements: ?Goal: Complications related to the disease process, condition or treatment will be avoided or minimized ?Outcome: Progressing ?Goal: Diagnostic test results will improve ?Outcome: Progressing ?  ?Problem: Safety: ?Goal: Verbalization of understanding the information provided will improve ?Outcome: Progressing ?  ?Problem: Self-Concept: ?Goal: Level of anxiety will decrease ?Outcome: Progressing ?Goal: Ability to verbalize feelings about condition will improve ?Outcome: Progressing ?  ?

## 2021-04-13 NOTE — Progress Notes (Signed)
Physical Therapy Treatment and DISCHARGE ? ?Patient Details ?Name: Steve [name] ?MRN: [id number] ?DOB: 1965/08/08 ?Today's Date: 04/13/2021 ? ? ?History of Present Illness Pt is a 56 y.o. male admitted from group home for convalescence, combativeness and altered sensorium on 01/05/21 with witnessed seizure, AMS. 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 EEG unremarkable. LP on 12/21 showed staph in CSF, felt to be contaminant. Workup for AKI, rhabdomyolysis. Course complicated by c/o L foot pain; imaging negative for acute injury. Pt remains hospitalized due to difficult to place. Pt with psych consult who felt no need for inpt psych. No PMH in chart. ? ?  ?PT Comments  ? ? Pt functioning a supervision level for cognitive impairment only. Pt given a pair of shoes by Liliane Channel, OT in which pt is ambulating t/o hallways with distant supervision. Despite mild instability pt with no overt LOB and is able to steady self/catch himself. Pt has no further skilled acute PT needs at this time. PT SIGNING OFF. Pt has met all goals. Please re-consult if needed in future. ?   ?Recommendations for follow up therapy are one component of a multi-disciplinary discharge planning process, led by the attending physician.  Recommendations may be updated based on patient status, additional functional criteria and insurance authorization. ? ?Follow Up Recommendations ? Long-term institutional care without follow-up therapy ?  ?  ?Assistance Recommended at Discharge Intermittent Supervision/Assistance  ?Patient can return home with the following Direct supervision/assist for medications management;Direct supervision/assist for financial management;Assist for transportation ?  ?Equipment Recommendations ? None recommended by PT  ?  ?Recommendations for Other Services   ? ? ?  ?Precautions / Restrictions  Precautions ?Precautions: Fall ?Precaution Comments: has a sitter ?Restrictions ?Weight Bearing Restrictions: No ?LLE Weight Bearing: Weight bearing as tolerated ?Other Position/Activity Restrictions: CAM boot not needed per ortho note  ?  ? ?Mobility ? Bed Mobility ?Overal bed mobility: Independent ?Bed Mobility: Supine to Sit ?  ?  ?Supine to sit: Independent ?Sit to supine: Independent ?  ?General bed mobility comments: able to get to EOB without assistance ?  ? ?Transfers ?Overall transfer level: Modified independent ?Equipment used: None ?Transfers: Sit to/from Stand ?Sit to Stand: Modified independent (Device/Increase time) ?  ?  ?  ?  ?  ?General transfer comment: pt pushing up from bed, pauses once in standing prior to mobilizing ?  ? ?Ambulation/Gait ?Ambulation/Gait assistance: Min guard ?Gait Distance (Feet): 500 Feet ?Assistive device: None ?Gait Pattern/deviations: Step-through pattern, Antalgic ?Gait velocity: decr ?Gait velocity interpretation: 1.31 - 2.62 ft/sec, indicative of limited community ambulator ?  ?General Gait Details: pt continues with L LE limp with noted mild instability. Trialed shoe on R foot and CAM boot on L foot however pt very unsteady with noted L knee hyperextension, pt more steady with bilat shoes despite some pain in L foot. Pt able to maintain balance with moderate purtebations and even did a full 360 deg turn ? ? ?Stairs ?  ?  ?  ?  ?  ? ? ?Wheelchair Mobility ?  ? ?Modified Rankin (Stroke Patients Only) ?  ? ? ?  ?Balance Overall balance assessment: Needs assistance ?Sitting-balance support: Feet supported, No upper extremity supported ?Sitting balance-Leahy Scale: Normal ?Sitting balance - Comments: able to sit on EOB without assitance and address LB dressing ?  ?Standing balance support: No upper extremity supported, During functional activity ?Standing balance-Leahy Scale:  Good ?Standing balance comment: supervison to min guard due to patient unfamilar with wearing  shoes ?  ?  ?  ?  ?  ?  ?  ?  ?  ?  ?  ?  ? ?  ?Cognition Arousal/Alertness: Awake/alert ?Behavior During Therapy: [facility] for tasks assessed/performed, Impulsive ?Overall Cognitive Status: Impaired/Different from baseline ?Area of Impairment: Orientation, Attention, Memory, Following commands, Safety/judgement, Awareness, Problem solving ?  ?  ?  ?  ?  ?  ?  ?  ?Orientation Level: Situation, Disoriented to (pt stating he was in hospital from breaking his foot while riding a bull) ?Current Attention Level: Selective ?Memory: Decreased recall of precautions, Decreased short-term memory ?Following Commands: Follows one step commands with increased time, Follows one step commands consistently, Follows multi-step commands inconsistently ?Safety/Judgement: Decreased awareness of safety, Decreased awareness of deficits ?Awareness: Anticipatory ?Problem Solving: Requires verbal cues ?General Comments: pt talking about riding horses and bulls and that being the reason he is in the hospital. Pt continues to report family coming to see him however no family has been to the hospital to visit ?  ?  ? ?  ?Exercises   ? ?  ?General Comments General comments (skin integrity, edema, etc.): trialed different combos of shoes/boots to minimize L foot pain during ambulation, pt settled on bilat shoes ?  ?  ? ?Pertinent Vitals/Pain Pain Assessment ?Pain Assessment: Faces ?Faces Pain Scale: Hurts a little bit ?Pain Location: L foot ?Pain Descriptors / Indicators: Guarding (antalgia/limp with amb) ?Pain Intervention(s): Monitored during session (spoke with MD regarding pain meds, pt reports throbbing from 8-9am, 9-10pm)  ? ? ?Home Living   ?  ?  ?  ?  ?  ?  ?  ?  ?  ?   ?  ?Prior Function    ?  ?  ?   ? ?PT Goals (current goals can now be found in the care plan section) Acute Rehab PT Goals ?Patient Stated Goal: home ?Progress towards PT goals: Goals met/education completed, patient discharged from PT ? ?  ?Frequency ? ? ?   ? ? ? ?  ?PT Plan  Other (comment) (pt d/c from PT services)  ? ? ?Co-evaluation   ?  ?  ?  ?  ? ?  ?AM-PAC PT "6 Clicks" Mobility   ?Outcome Measure ? Help needed turning from your back to your side while in a flat bed without using bedrails?: None ?Help needed moving from lying on your back to sitting on the side of a flat bed without using bedrails?: None ?Help needed moving to and from a bed to a chair (including a wheelchair)?: None ?Help needed standing up from a chair using your arms (e.g., wheelchair or bedside chair)?: None ?Help needed to walk in hospital room?: A Little ?Help needed climbing 3-5 steps with a railing? : A Little ?6 Click Score: 22 ? ?  ?End of Session Equipment Utilized During Treatment: Gait belt ?Activity Tolerance: Patient tolerated treatment well ?Patient left: in bed;with bed alarm set;with call bell/phone within reach;with nursing/sitter in room ?Nurse Communication: Mobility status ?PT Visit Diagnosis: Other abnormalities of gait and mobility (R26.89);Other symptoms and signs involving the nervous system (R29.898) ?Pain - Right/Left: Left ?Pain - part of body: Leg ?  ? ? ?Time: 9485-4627 ?PT Time Calculation (min) (ACUTE ONLY): 21 min ? ?Charges:  $Gait Training: 8-22 mins          ?          ? ?  Kittie Plater, PT, DPT ?Acute Rehabilitation Services ?Pager #: [phone] ?Office #: [phone] ? ? ? ?Roda Lauture M Pearl Berlinger ?04/13/2021, 2:45 PM ? ?

## 2021-04-13 NOTE — Progress Notes (Signed)
?Progress Note ? ? ?PatientDaymen [name] MPN:[id number] DOB: Aug 10, 1965 DOA: 01/05/2021     98 ?DOS: the patient was seen and examined on 04/13/2021 ?  ?Brief hospital course: ?Steve [name] is a 56 y.o. male with history of major depression, anxiety who was in a residential ETOH treatment center for last 5 months. He was brought to the ED on 12/20 from a residential ETOH treatment center for convalescence, combativeness and altered sensorium.  Per collateral history, patient was not acting typical for last few days. ? ?In the ED, patient was confused, combative. Labs showed AKI with creatinine of 3.27, rhabdomyolysis, abnormal LFTs, elevated WBC count at 23.7 and lactic acid level at 7.6. 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 Blood culture was sent, empiric antibiotic started.  IV fluid resuscitation started and patient was initially admitted to ICU. ?12/21, underwent LP which showed staph in CSF, felt to be contaminant.  EEG did not show any seizures ?12/24, HD catheter was placed but patient did not require HD as creatinine started to improve ?12/31, with clinical improvement, patient was transferred out of ICU to [facility]. ?1/18 Cognitive evaluations: 2 on the SLUMS, a 9 on the MMSE, and a 0 on medi-cog and short blessed test ? ?Assessment and Plan: ?* Cognitive impairment  2/2 Wernicke's encephalopathy and alcoholic dementia ?Initial MRI revealed tiny acute infarct on the right but not significant enough to explain persistent cognitive and behavioral abnormalities. Also evidence of prior lacunar infarct right basal ganglia ?Cognitive screening revealed significant cognitive deficits ?Continue Prozac, Depakote, Klonopin and HS melatonin; on 3/23 I did increase Klonopin from BID to TID at patient request due to increasing anxiety ?Appreciate psychiatric team assistance.  Continue Risperdal and  to taper/consider discontinue Risperdal ?Likely Depakote would also help so plan is to continue to increase dose to therapeutic levels per psychiatry team ?Continue thiamine-previously had been given high-dose IV thiamine earlier in the hospitalization with some improvement in cognition ? ?After discharge recommendations: PLEASE NOTE THAT THIS PT'S REGIMEN OF PSYCHOTROPIC MEDS CAN NEVER BE WEANED AND DC'D DUE TO SEVERITY OF HIS WERNICKE'S DEMENTIA ? ? ? ? ?Witnessed seizure ([name]) ?No apparent prior history of seizure disorder noting patient was not on AEDs prior to admission ?Unclear if withdrawal seizure or related to other substances ?3/10 given recent behavioral changes attending felt EEG needed to be completed to rule out atypical seizure activity causing behavioral manifestations.  EEG unremarkable other than for moderate diffuse encephalopathy.  No seizure activity. ?Continue Keppra at recommendation of neurology ? ?Acute kidney injury with rhabdomyolysis-resolved as of 03/17/2021 ?Resolved ?Creatinine peaked at 7.48 on 12/26.  Nephrology consulted with initial plans to initiate dialysis including placement of [facility] but renal function gradually improved and dialysis not needed and TDC removed ? ?Eczema ?Resolved with use of topical cortisone cream ?  ?2/27 ? ? ?2/27 ? ? ?3/5 ? ? ? ? ?Constipation ?Resolved ? ?Aspiration pneumonia ([name])-resolved as of 03/04/2021 ?Completed a course of Zosyn ? ?Possible DVT (deep venous thrombosis) ([name])-resolved as of 03/17/2021 ?Per critical care note, hemodialysis cath had a clot on it when it was removed.  UE duplex  12/31: no DVT.   ? ?Right ventricular dysfunction ?Echo 1/2 with EF 60 to 65%.  Cannot rule out a small PFO this finding determined to be clinically insignificant given the patient was asymptomatic ? ?Elevated transaminases at time of presentation-resolved as of  03/04/2021 ?Likely secondary to sepsis like physiology at presentation ?His AST and ALT were significantly  elevated to over 700s on admission.  ?Re emergence mild elevation LFTs-likely due to Zyprexa and Depakote ?Due to oversedation Zyprexa dose decreased ?LFTs have normalized with decrease in Zyprexa dose. ?2/16 valproic acid level 55 ? ? ?Physical deconditioning 2/2 ambulatory dysfunction ?Patient now ambulating without difficulty ? ?A physical therapy consult is indicated based on the patient?s mobility assessment. ? ? ?Mobility Assessment (last 72 hours)   ? ? Mobility Assessment   ? ? Row Name 03/15/21 1700 03/15/21 1200 03/14/21 1200 03/14/21 0952 03/14/21 0830  ? Does patient have an order for bedrest or is patient medically unstable -- No - Continue assessment -- No - Continue assessment No - Continue assessment  ? What is the highest level of mobility based on the progressive mobility assessment? Level 4 (Walks with assist in room) - Balance while marching in place and cannot step forward and back - Complete Level 4 (Walks with assist in room) - Balance while marching in place and cannot step forward and back - Complete -- Level 4 (Walks with assist in room) - Balance while marching in place and cannot step forward and back - Complete Level 4 (Walks with assist in room) - Balance while marching in place and cannot step forward and back - Complete  ? Is the above level different from baseline mobility prior to current illness? -- Yes - Recommend PT order -- Yes - Recommend PT order Yes - Recommend PT order  ? ? Row Name 03/13/21 2034 03/13/21 0800  ?  ?  ?  ? Does patient have an order for bedrest or is patient medically unstable No - Continue assessment No - Continue assessment     ? What is the highest level of mobility based on the progressive mobility assessment? Level 4 (Walks with assist in room) - Balance while marching in place and cannot step forward and back - Complete Level 2 (Chairfast) - Balance while sitting on edge of bed and cannot stand     ? Is the above level different from baseline mobility  prior to current illness? Yes - Recommend PT order Yes - Recommend PT order     ? ?  ?  ? ?  ? ? ? ? ?Severe sepsis without septic shock ([name])-resolved as of 03/17/2021 ?Ruled out ?Sepsis-like physiology secondary to presentation with profound dehydration, hypoperfusion from dehydration related hypotension seizure activity ?Chest work-up was negative ? ?Skin abnormalities/perineal MASD ?Resolved ? ?Protein calorie malnutrition ([name]) ?Patient able to feed self independently and is eating well ? ? ? ? ? ? ? ?Subjective:  ?Patient sleeping soundly and was not disturbed.  Did not awaken during exam. ? ?Physical Exam: ?Vitals:  ? 04/12/21 1945 04/12/21 2330 04/13/21 0358 04/13/21 5277  ?BP: 95/66 95/66 110/76 108/82  ?Pulse: 95 89 77 89  ?Resp: 18 18 20 20   ?Temp: 98.2 ?F (36.8 ?C) 98 ?F (36.7 ?C) 97.6 ?F (36.4 ?C) 97.9 ?F (36.6 ?C)  ?TempSrc: Oral Oral Oral   ?SpO2: 98% 98% 98% 100%  ?Weight:      ?Height:      ? ?Constitutional: Sleeping soundly ?Respiratory: Stable on room air, anterior lung sounds clear to auscultation.  Normal respiratory effort and pattern ?Cardiovascular: Normotensive, S1-S2, no peripheral edema.  Regular pulse. ?Abdomen: no tenderness, no masses palpated. Bowel sounds positive. LBM 3/27 ?Neurologic: Sleeping but at baseline CN 2-12 grossly intact. Sensation intact, Strength 5/5  x all 4 extremities.  Able to ambulate independently without gait disturbance ?Psychiatric: Sleeping soundly ? ?There are no new results to review at this time. ? ?Family Communication:  ?Patient only ? ?Disposition: ?Status is: Inpatient ?Remains inpatient appropriate because:  ?Unsafe discharge plan secondary to severe cognitive impairment requiring SNF placement.  Currently lacks funding. ? ? ? ? ? DVT Prophylaxis  .Rivaroxaban (xarelto) tablet 10 mg , Rivaroxaban (xarelto) tablet 10 mg  ? ?Planned Discharge Destination:  ?Skilled nursing facility for custodial care ? ?Medically stable: ?No-still titrating behavioral  meds ? ?COVID vaccination status:  ?Unknown ? ?Consultants: ?Nephrology ?Psychiatry ?  ? ?Procedures: ?EEG ?Echocardiogram ?TDC ? ? Antibiotics: ?Unasyn x1 dose, cefepime x1 dose, ceftriaxone x1 dose, vanco

## 2021-04-14 ENCOUNTER — Inpatient Hospital Stay (HOSPITAL_COMMUNITY): Payer: Medicaid Other

## 2021-04-14 LAB — COMPREHENSIVE METABOLIC PANEL
ALT: 41 U/L (ref 0–44)
AST: 28 U/L (ref 15–41)
Albumin: 3.5 g/dL (ref 3.5–5.0)
Alkaline Phosphatase: 38 U/L (ref 38–126)
Anion gap: 5 (ref 5–15)
BUN: 24 mg/dL — ABNORMAL HIGH (ref 6–20)
CO2: 27 mmol/L (ref 22–32)
Calcium: 9.1 mg/dL (ref 8.9–10.3)
Chloride: 106 mmol/L (ref 98–111)
Creatinine, Ser: 0.77 mg/dL (ref 0.61–1.24)
GFR, Estimated: >60 mL/min (ref >60)
Glucose, Bld: 91 mg/dL (ref 70–99)
Potassium: 4 mmol/L (ref 3.5–5.1)
Sodium: 138 mmol/L (ref 135–145)
Total Bilirubin: 0.5 mg/dL (ref 0.3–1.2)
Total Protein: 6.4 g/dL — ABNORMAL LOW (ref 6.5–8.1)

## 2021-04-14 LAB — CBC WITH DIFFERENTIAL/PLATELET
Abs Immature Granulocytes: 0.06 10*3/uL (ref 0.00–0.07)
Basophils Absolute: 0.1 10*3/uL (ref 0.0–0.1)
Basophils Relative: 1 %
Eosinophils Absolute: 0.3 10*3/uL (ref 0.0–0.5)
Eosinophils Relative: 5 %
HCT: 32.2 % — ABNORMAL LOW (ref 39.0–52.0)
Hemoglobin: 10.7 g/dL — ABNORMAL LOW (ref 13.0–17.0)
Immature Granulocytes: 1 %
Lymphocytes Relative: 47 %
Lymphs Abs: 3.1 10*3/uL (ref 0.7–4.0)
MCH: 33.1 pg (ref 26.0–34.0)
MCHC: 33.2 g/dL (ref 30.0–36.0)
MCV: 99.7 fL (ref 80.0–100.0)
Monocytes Absolute: 1 10*3/uL (ref 0.1–1.0)
Monocytes Relative: 15 %
Neutro Abs: 2 10*3/uL (ref 1.7–7.7)
Neutrophils Relative %: 31 %
Platelets: 247 10*3/uL (ref 150–400)
RBC: 3.23 MIL/uL — ABNORMAL LOW (ref 4.22–5.81)
RDW: 14.5 % (ref 11.5–15.5)
WBC: 6.5 10*3/uL (ref 4.0–10.5)
nRBC: 0 % (ref 0.0–0.2)

## 2021-04-14 LAB — VALPROIC ACID LEVEL: Valproic Acid Lvl: 44 ug/mL — ABNORMAL LOW (ref 50.0–100.0)

## 2021-04-14 IMAGING — CR DG ANKLE COMPLETE 3+V*L*
3 series · 3 of 3 positions shown · non-contrast
Comparison: Left ankle radiographs [DATE]

CLINICAL DATA: Left ankle and left foot pain after "bull fell on my
foot at rodeo."

EXAM:
LEFT ANKLE COMPLETE - 3+ VIEW; LEFT FOOT - 2 VIEW

[ankle ap]
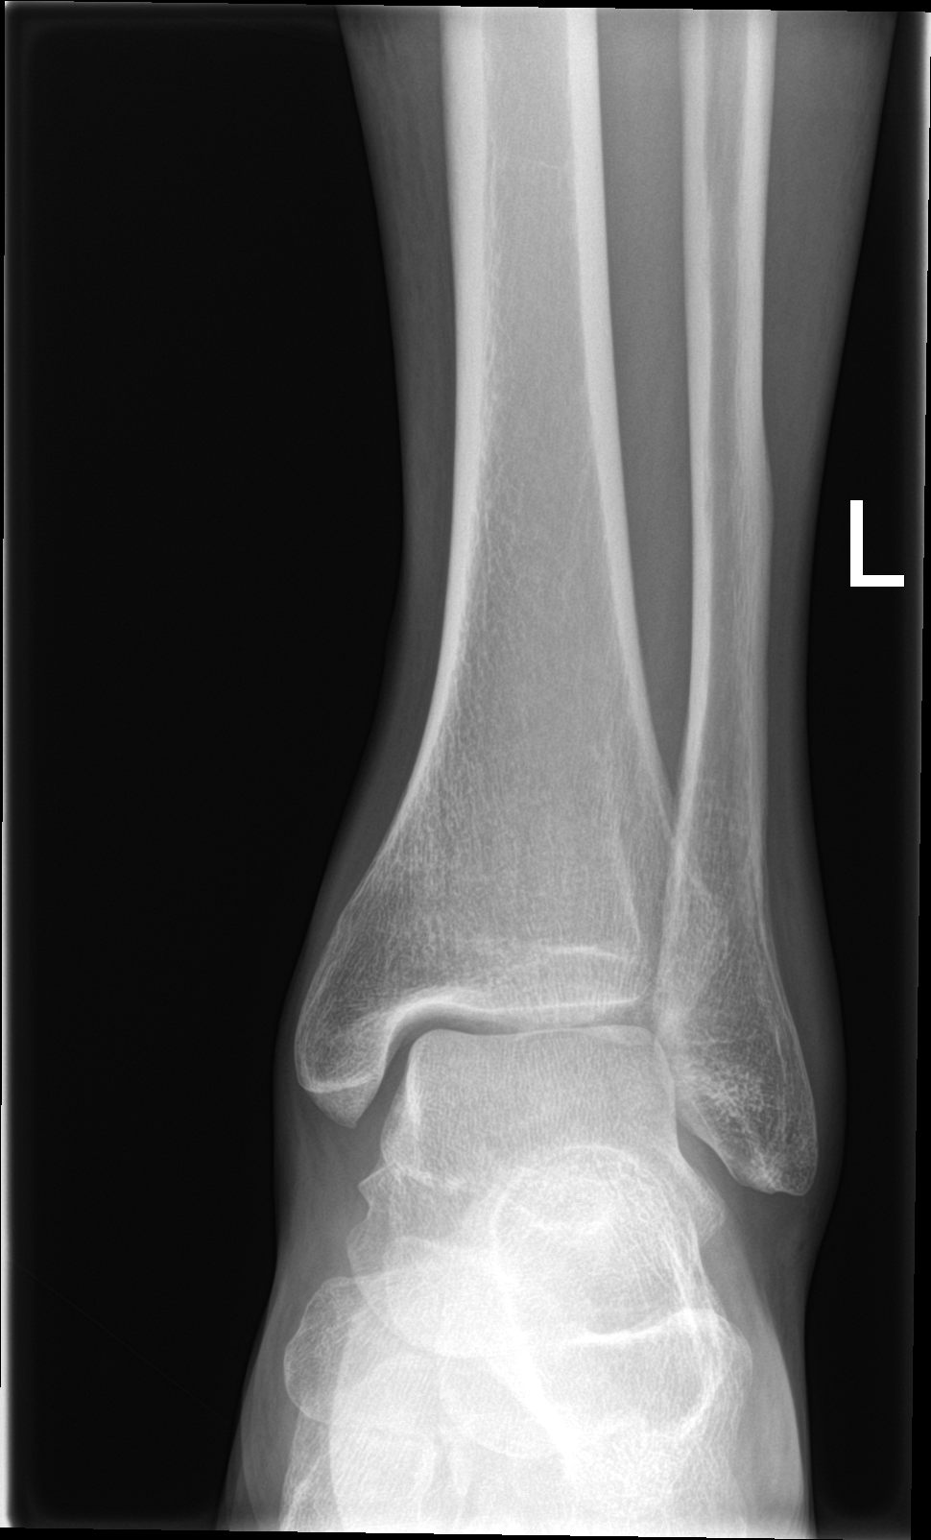

[ankle obl]
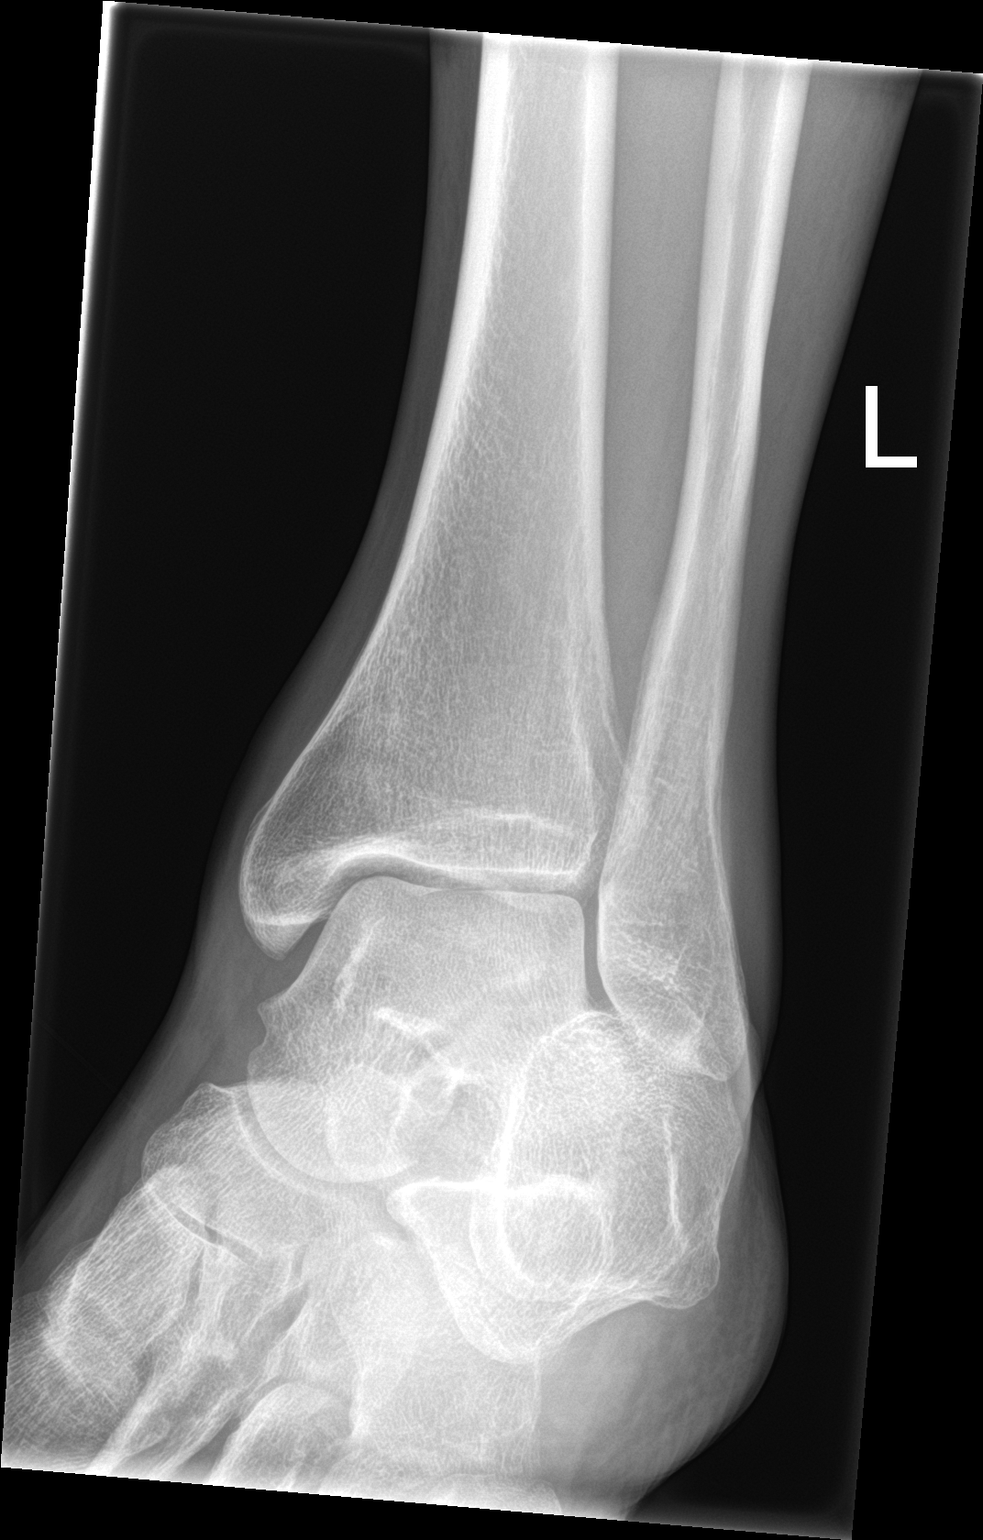

[ankle lat]
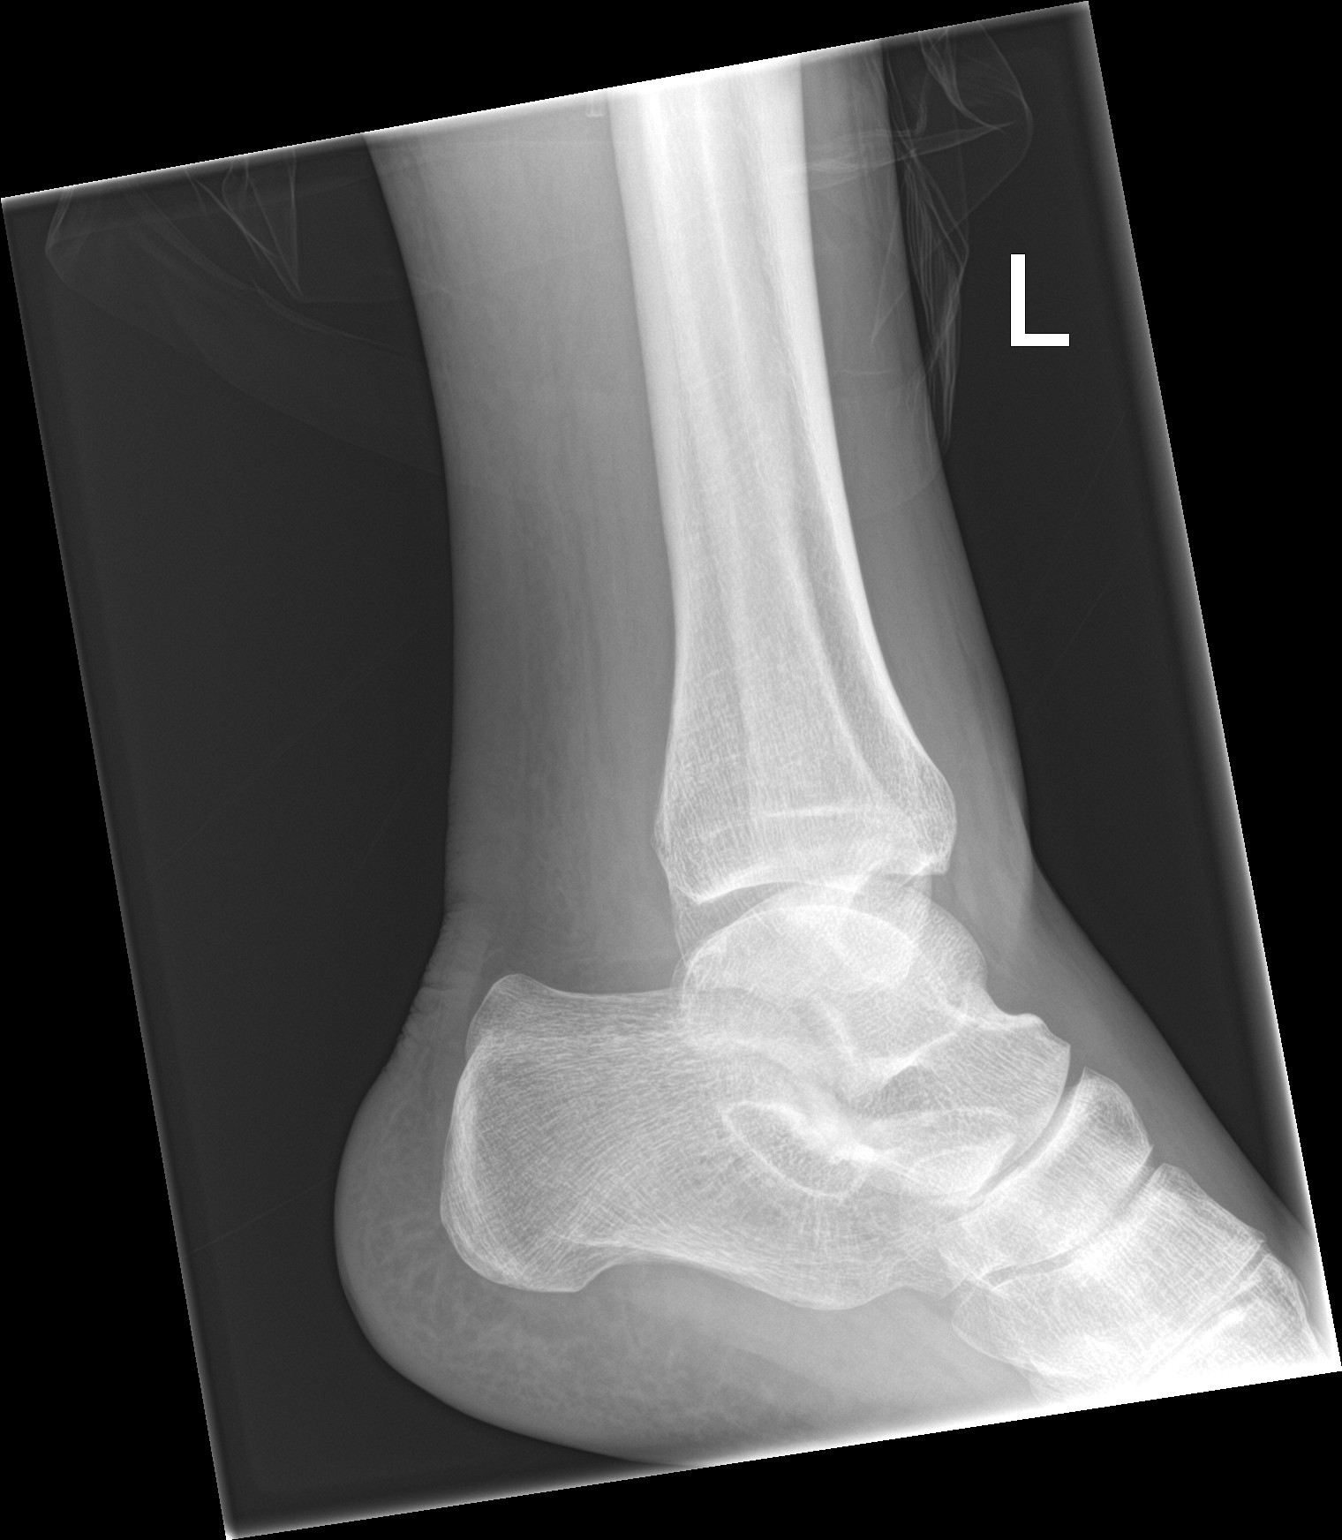

[3 of 3 positions shown; findings below may reference images not displayed]

FINDINGS: Left ankle:

The ankle mortise is symmetric and intact. Joint spaces are
preserved. No acute fracture or dislocation.

Left foot:

Mild degenerative spurring of the lateral aspect of the great toe
metatarsal head. Minimal medial great toe metatarsophalangeal joint
space narrowing. Mild joint space narrowing of the interphalangeal
joints diffusely. Mild tarsometatarsal joint space narrowing. No
acute fracture or dislocation.
IMPRESSION: :
IMPRESSION: 1. No acute fracture.
2. Mild midfoot and great toe metatarsophalangeal joint
osteoarthritis.

## 2021-04-14 IMAGING — CR DG FOOT 2V*L*
2 series · 2 of 2 positions shown · non-contrast
Comparison: Left ankle radiographs [DATE]

CLINICAL DATA: Left ankle and left foot pain after "bull fell on my
foot at rodeo."

EXAM:
LEFT ANKLE COMPLETE - 3+ VIEW; LEFT FOOT - 2 VIEW

[foot ap]
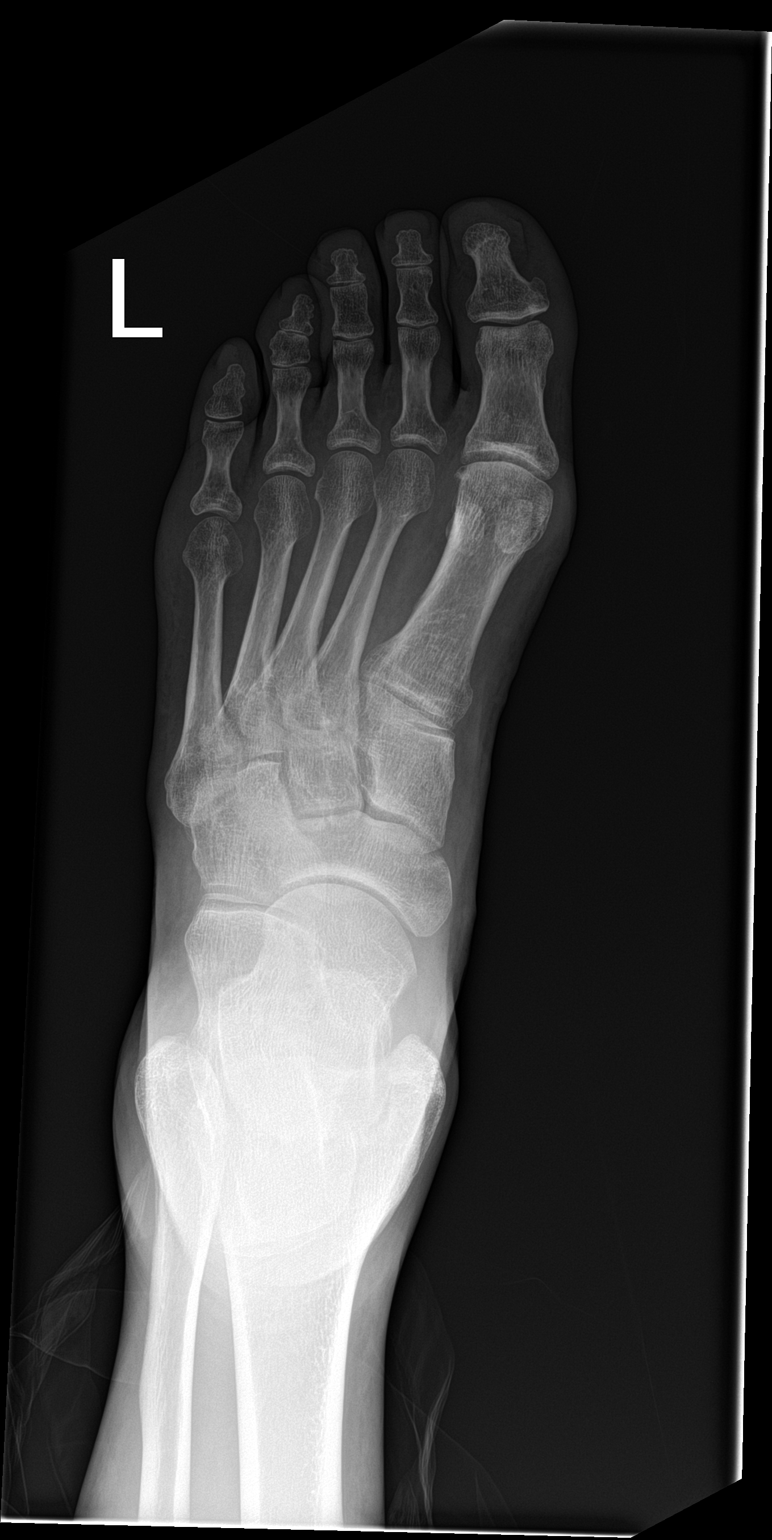

[foot lat]
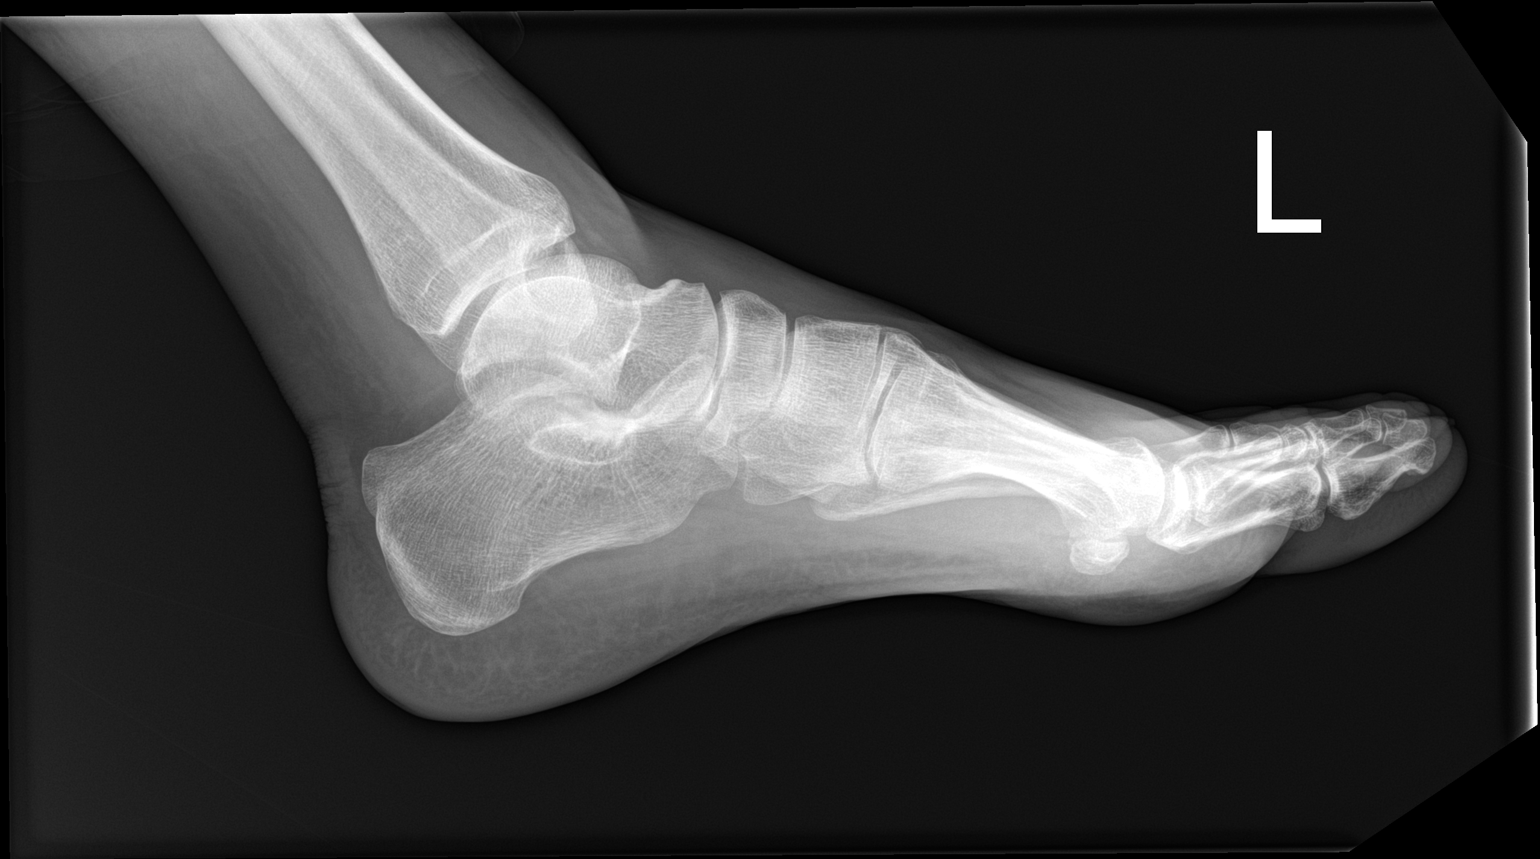

[2 of 2 positions shown; findings below may reference images not displayed]

FINDINGS: Left ankle:

The ankle mortise is symmetric and intact. Joint spaces are
preserved. No acute fracture or dislocation.

Left foot:

Mild degenerative spurring of the lateral aspect of the great toe
metatarsal head. Minimal medial great toe metatarsophalangeal joint
space narrowing. Mild joint space narrowing of the interphalangeal
joints diffusely. Mild tarsometatarsal joint space narrowing. No
acute fracture or dislocation.
IMPRESSION: :
IMPRESSION: 1. No acute fracture.
2. Mild midfoot and great toe metatarsophalangeal joint
osteoarthritis.

## 2021-04-14 MED ORDER — HALOPERIDOL LACTATE 5 MG/ML IJ SOLN: 5.0000 mg | Freq: Once | INTRAMUSCULAR | Status: DC

## 2021-04-14 MED ORDER — OXYCODONE HCL 5 MG PO TABS
10.0000 mg | ORAL_TABLET | Freq: Once | ORAL | Status: AC
  Administered 2021-04-14: 10 mg via ORAL
  Filled 2021-04-14: qty 2

## 2021-04-14 MED ORDER — HALOPERIDOL LACTATE 5 MG/ML IJ SOLN
5.0000 mg | Freq: Once | INTRAMUSCULAR | Status: DC
  Filled 2021-04-14: qty 1

## 2021-04-14 MED ORDER — ALPRAZOLAM 0.25 MG PO TABS
0.2500 mg | ORAL_TABLET | Freq: Once | ORAL | Status: AC
  Administered 2021-04-14: 0.25 mg via ORAL
  Filled 2021-04-14: qty 1

## 2021-04-14 MED ORDER — DIPHENHYDRAMINE HCL 25 MG PO CAPS
25.0000 mg | ORAL_CAPSULE | Freq: Four times a day (QID) | ORAL | Status: DC | PRN
  Administered 2021-04-15 – 2021-04-18 (×4): 25 mg via ORAL
  Filled 2021-04-14 (×5): qty 1

## 2021-04-14 NOTE — Progress Notes (Signed)
Patient refused colace, miralax, and a nicotine patch. Patient educated and MD notified ?

## 2021-04-14 NOTE — Plan of Care (Signed)
?  Problem: Health Behavior/Discharge Planning: ?Goal: Ability to manage health-related needs will improve ?Outcome: Progressing ?  ?Problem: Clinical Measurements: ?Goal: Ability to maintain clinical measurements within normal limits will improve ?Outcome: Progressing ?Goal: Will remain free from infection ?Outcome: Progressing ?Goal: Diagnostic test results will improve ?Outcome: Progressing ?Goal: Respiratory complications will improve ?Outcome: Progressing ?Goal: Cardiovascular complication will be avoided ?Outcome: Progressing ?  ?Problem: Activity: ?Goal: Risk for activity intolerance will decrease ?Outcome: Progressing ?  ?Problem: Nutrition: ?Goal: Adequate nutrition will be maintained ?Outcome: Progressing ?  ?Problem: Coping: ?Goal: Level of anxiety will decrease ?Outcome: Progressing ?  ?Problem: Elimination: ?Goal: Will not experience complications related to bowel motility ?Outcome: Progressing ?Goal: Will not experience complications related to urinary retention ?Outcome: Progressing ?  ?Problem: Pain Managment: ?Goal: General experience of comfort will improve ?Outcome: Progressing ?  ?Problem: Safety: ?Goal: Ability to remain free from injury will improve ?Outcome: Progressing ?  ?Problem: Skin Integrity: ?Goal: Risk for impaired skin integrity will decrease ?Outcome: Progressing ?  ?Problem: Education: ?Goal: Expressions of having a comfortable level of knowledge regarding the disease process will increase ?Outcome: Progressing ?  ?Problem: Coping: ?Goal: Ability to adjust to condition or change in health will improve ?Outcome: Progressing ?Goal: Ability to identify appropriate support needs will improve ?Outcome: Progressing ?  ?Problem: Health Behavior/Discharge Planning: ?Goal: Compliance with prescribed medication regimen will improve ?Outcome: Progressing ?  ?Problem: Medication: ?Goal: Risk for medication side effects will decrease ?Outcome: Progressing ?  ?Problem: Clinical  Measurements: ?Goal: Complications related to the disease process, condition or treatment will be avoided or minimized ?Outcome: Progressing ?Goal: Diagnostic test results will improve ?Outcome: Progressing ?  ?Problem: Safety: ?Goal: Verbalization of understanding the information provided will improve ?Outcome: Progressing ?  ?Problem: Self-Concept: ?Goal: Level of anxiety will decrease ?Outcome: Progressing ?Goal: Ability to verbalize feelings about condition will improve ?Outcome: Progressing ?  ?

## 2021-04-14 NOTE — Progress Notes (Addendum)
?Progress Note ? ? ?PatientTerrilyn [name]: Steve [name] ZOX:[id number] DOB: 10/04/1965 DOA: 01/05/2021     99 ?DOS: the patient was seen and examined on 04/14/2021 ?  ?Brief hospital course: ?Steve [name] is a 56 y.o. male with history of major depression, anxiety who was in a residential ETOH treatment center for last 5 months. He was brought to the ED on 12/20 from a residential ETOH treatment center for convalescence, combativeness and altered sensorium.  Per collateral history, patient was not acting typical for last few days. ? ?In the ED, patient was confused, combative. Labs showed AKI with creatinine of 3.27, rhabdomyolysis, abnormal LFTs, elevated WBC count at 23.7 and lactic acid level at 7.6. 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 Blood culture was sent, empiric antibiotic started.  IV fluid resuscitation started and patient was initially admitted to ICU. ?12/21, underwent LP which showed staph in CSF, felt to be contaminant.  EEG did not show any seizures ?12/24, HD catheter was placed but patient did not require HD as creatinine started to improve ?12/31, with clinical improvement, [facility] ?1/18 Cognitive evaluations: 2 on the SLUMS, a 9 on the MMSE, and a 0 on medi-cog and short blessed test ? ?Assessment and Plan: ?* Cognitive impairment  2/2 Wernicke's encephalopathy and alcoholic dementia ?Initial MRI revealed tiny acute infarct on the right but not significant enough to explain persistent cognitive and behavioral abnormalities. Also evidence of prior lacunar infarct right basal ganglia ?Cognitive screening revealed significant cognitive deficits ?Continue Prozac, Depakote, Klonopin and HS melatonin; on 3/23 I did increase Klonopin from BID to TID at patient request due to increasing anxiety ?Appreciate psychiatric team assistance.  Continue Risperdal and  to taper/consider discontinue Risperdal ?Likely Depakote would also help so plan is to continue to increase dose to therapeutic levels per psychiatry team ?Continue thiamine-previously had been given high-dose IV thiamine earlier in the hospitalization with some improvement in cognition ? ?After discharge recommendations: PLEASE NOTE THAT THIS PT'S REGIMEN OF PSYCHOTROPIC MEDS CAN NEVER BE WEANED AND DC'D DUE TO SEVERITY OF HIS WERNICKE'S DEMENTIA ? ? ? ? ?Witnessed seizure ([name]) ?No apparent prior history of seizure disorder noting patient was not on AEDs prior to admission ?Unclear if withdrawal seizure or related to other substances ?3/10 given recent behavioral changes attending felt EEG needed to be completed to rule out atypical seizure activity causing behavioral manifestations.  EEG unremarkable other than for moderate diffuse encephalopathy.  No seizure activity. ?Continue Keppra at recommendation of neurology ? ?Acute kidney injury with rhabdomyolysis-resolved as of 03/17/2021 ?Resolved ?Creatinine peaked at 7.48 on 12/26.  Nephrology consulted with [facility] but [facility] function gradually improved and dialysis not needed and TDC removed ? ?Eczema ?Resolved with use of topical cortisone cream ?  ?2/27 ? ? ?2/27 ? ? ?3/5 ? ? ? ? ?Constipation ?Resolved ? ?Aspiration pneumonia ([name])-resolved as of 03/04/2021 ?Completed a course of Zosyn ? ?Possible DVT (deep venous thrombosis) ([name])-resolved as of 03/17/2021 ?Per critical care note, hemodialysis cath had a clot on it when it was removed.  UE duplex  12/31: no DVT.   ? ?Right ventricular dysfunction ?Echo 1/2 with EF 60 to 65%.  Cannot rule out a small PFO this finding determined to be clinically insignificant given the patient was asymptomatic ? ?Elevated transaminases at time of presentation-resolved as of  03/04/2021 ?Likely secondary to sepsis like physiology at presentation ?His AST and ALT were significantly  elevated to over 700s on admission.  ?Re emergence mild elevation LFTs-likely due to Zyprexa and Depakote ?Due to oversedation Zyprexa dose decreased ?LFTs have normalized with decrease in Zyprexa dose. ?2/16 valproic acid level 55 ? ? ?Physical deconditioning 2/2 ambulatory dysfunction ?Patient now ambulating without difficulty ? ?A physical therapy consult is indicated based on the patient?s mobility assessment. ? ? ?Mobility Assessment (last 72 hours)   ? ? Mobility Assessment   ? ? Row Name 03/15/21 1700 03/15/21 1200 03/14/21 1200 03/14/21 0952 03/14/21 0830  ? Does patient have an order for bedrest or is patient medically unstable -- No - Continue assessment -- No - Continue assessment No - Continue assessment  ? What is the highest level of mobility based on the progressive mobility assessment? Level 4 (Walks with assist in room) - Balance while marching in place and cannot step forward and back - Complete Level 4 (Walks with assist in room) - Balance while marching in place and cannot step forward and back - Complete -- Level 4 (Walks with assist in room) - Balance while marching in place and cannot step forward and back - Complete Level 4 (Walks with assist in room) - Balance while marching in place and cannot step forward and back - Complete  ? Is the above level different from baseline mobility prior to current illness? -- Yes - Recommend PT order -- Yes - Recommend PT order Yes - Recommend PT order  ? ? Row Name 03/13/21 2034 03/13/21 0800  ?  ?  ?  ? Does patient have an order for bedrest or is patient medically unstable No - Continue assessment No - Continue assessment     ? What is the highest level of mobility based on the progressive mobility assessment? Level 4 (Walks with assist in room) - Balance while marching in place and cannot step forward and back - Complete Level 2 (Chairfast) - Balance while sitting on edge of bed and cannot stand     ? Is the above level different from baseline mobility  prior to current illness? Yes - Recommend PT order Yes - Recommend PT order     ? ?  ?  ? ?  ? ? ? ? ?Severe sepsis without septic shock ([name])-resolved as of 03/17/2021 ?Ruled out ?Sepsis-like physiology secondary to presentation with profound dehydration, hypoperfusion from dehydration related hypotension seizure activity ?Chest work-up was negative ? ?Skin abnormalities/perineal MASD ?Resolved ? ?Protein calorie malnutrition ([name]) ?Patient able to feed self independently and is eating well ? ? ?Left ankle pain ?Patient has been worked up thoroughly including plain x-rays as well as MRI dating back to January. ?He is once again complaining of pain and the bedside nurse is concerned of a new injury ?We will obtain complete x-ray of left ankle and foot ?Patient becoming anxious due to pain and threatening to leave AMA so we will give him 10 mg of Oxy IR along with 0.25 mg of Xanax to see if that helps with his anxiety and pain ? ? ? ? ?Subjective:  ?Alert without any specific complaints.  Says he is trying to find something he has not eaten yet on the menu. ? ?Physical Exam: ?Vitals:  ? 04/13/21 0358 04/13/21 0722 04/13/21 2004 04/14/21 0743  ?BP: 110/76 108/82 100/70 103/71  ?Pulse: 77 89 87 85  ?Resp: 20 20 16 18   ?Temp: 97.6 ?F (36.4 ?C)  97.9 ?F (36.6 ?C) 97.8 ?F (36.6 ?C) 97.7 ?F (36.5 ?C)  ?TempSrc: Oral  Oral Axillary  ?SpO2: 98% 100% 98% 100%  ?Weight:      ?Height:      ? ?Constitutional: Sleeping soundly ?Respiratory: Stable on room air, anterior lung sounds clear to auscultation.  Normal respiratory effort ?Cardiovascular: Normotensive, S1-S2, no peripheral edema.  Regular pulse. ?Abdomen: no tenderness, no masses palpated. Bowel sounds positive. LBM 3/28 ?Neurologic: Sleeping but at baseline CN 2-12 grossly intact. Sensation intact, Strength 5/5 x all 4 extremities.  Able to ambulate independently without gait disturbance ?Psychiatric: Sleeping soundly ? ?Labs from today are stable.  LFTs are normal.   Valproic acid level 44 ? ?Family Communication:  ?Patient only ? ?Disposition: ?Status is: Inpatient ?Remains inpatient appropriate because:  ?Unsafe discharge plan secondary to severe cognitive impairment re

## 2021-04-14 NOTE — Progress Notes (Signed)
Patient continues to be agitated d/t staying in the hospital. Patient attempted to leave to "go smoke 10 cigarettes."  CN, DD, and RN were able to talk him back into his room. MD has been notified. No orders at this time. ?

## 2021-04-14 NOTE — Progress Notes (Signed)
Speech Language Pathology Treatment: Cognitive-Linquistic  ?Patient Details ?Name: Steve [name] ?MRN: [id number] ?DOB: 12/24/1965 ?Today's Date: 04/14/2021 ?Time: 213 325 1336 ?SLP Time Calculation (min) (ACUTE ONLY): 21 min ? ?Assessment / Plan / Recommendation ?Clinical Impression ? Pt was seen for cognitive-linguistic treatment. He was alert and cooperative during the session. Pt required reduced cues for attention. He completed an executive function split-word task with 67% accuracy increasing to 88% with cues and prompts. He demonstrated 75% accuracy with reasoning during this task increasing to 100% with cues. He achieved 60% accuracy with recall of concrete information from recorded voice mails despite prompts and cues. Pt was educated regarding use of external memory aids to assist with recall. With cues, pt was observed to write down important information while listening to voice mails. With use of this strategy, he demonstrated 60% accuracy with subsequent voice mails increasing to 100% with cues. SLP will continue to follow pt.   ?  ?HPI HPI: Pt is a 56 y.o. male admitted from group home (substance abuse rehab?) on 01/05/21 with witnessed seizure, AMS. 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 EEG unremarkable. LP on 12/21 showed staph in CSF, felt to be contaminant. Workup for AKI, rhabdomyolysis. Dx Uremic encephalopathy. Course complicated by c/o L foot pain; imaging negative for acute injury. Pt remains hospitalized due to difficulty with placement. SLP evaluation on 01/26/21: 9/27 on Mini Mental State Exam, SLUMS 02/03/21: 2/30; SLUMS 1/27: 1/30. Psych consulted and as of 1/16, pt did not meet criteria for inpatient psychiatric admission. ?  ?   ?SLP Plan ? Continue with current plan of care ? ?  ?  ?Recommendations for follow up therapy are one component of a multi-disciplinary  discharge planning process, led by the attending physician.  Recommendations may be updated based on patient status, additional functional criteria and insurance authorization. ?  ? ?Recommendations  ?   ?   ?    ?   ? ? ? ? Oral Care Recommendations: Oral care BID ?Follow Up Recommendations: Skilled nursing-short term rehab (<3 hours/day) ?Assistance recommended at discharge: Frequent or constant Supervision/Assistance ?SLP Visit Diagnosis: Cognitive communication deficit (R41.841);Attention and concentration deficit ?Attention and concentration deficit following: Other cerebrovascular disease ?Plan: Continue with current plan of care ? ? ? ? ?  ?  ?Kassady Laboy I. [name], MS, CCC-SLP ?Acute Rehabilitation Services ?Office number [phone] ?Pager [phone] ? ? ?[name] ? ?04/14/2021, 9:33 AM ? ? ? ?

## 2021-04-14 NOTE — Progress Notes (Signed)
Patient is agitated a bit and wanting to leave. He says, "He doesn't care what anyone says, he's leaving." I asked him where he was going to go. He said, "My sisters house." I told him if she came up here and agreed to be his sole caregiver, we would discuss it. MD notified.  This patient is still waiting for appropriate placement. ? ?

## 2021-04-15 LAB — C-REACTIVE PROTEIN: CRP: 1 mg/dL — ABNORMAL HIGH (ref <1.0)

## 2021-04-15 LAB — URIC ACID: Uric Acid, Serum: 3.9 mg/dL (ref 3.7–8.6)

## 2021-04-15 LAB — SEDIMENTATION RATE: Sed Rate: 15 mm/hr (ref 0–16)

## 2021-04-15 MED ORDER — VALPROIC ACID 250 MG PO CAPS
1000.0000 mg | ORAL_CAPSULE | Freq: Every day | ORAL | Status: DC
  Administered 2021-04-15 – 2021-05-13 (×29): 1000 mg via ORAL
  Filled 2021-04-15 (×30): qty 4

## 2021-04-15 MED ORDER — PREDNISONE 20 MG PO TABS
20.0000 mg | ORAL_TABLET | Freq: Every day | ORAL | Status: AC
  Administered 2021-04-15 – 2021-04-19 (×5): 20 mg via ORAL
  Filled 2021-04-15 (×5): qty 1

## 2021-04-15 MED ORDER — COLCHICINE 0.3 MG HALF TABLET
0.3000 mg | ORAL_TABLET | Freq: Two times a day (BID) | ORAL | Status: DC
  Administered 2021-04-15 – 2021-04-19 (×9): 0.3 mg via ORAL
  Filled 2021-04-15 (×9): qty 1

## 2021-04-15 MED ORDER — VALPROIC ACID 250 MG PO CAPS
500.0000 mg | ORAL_CAPSULE | Freq: Every morning | ORAL | Status: DC
  Administered 2021-04-16 – 2021-04-20 (×5): 500 mg via ORAL
  Filled 2021-04-15 (×5): qty 2

## 2021-04-15 MED ORDER — OLANZAPINE 2.5 MG PO TABS
2.5000 mg | ORAL_TABLET | Freq: Every day | ORAL | Status: DC
  Administered 2021-04-15 – 2021-04-19 (×5): 2.5 mg via ORAL
  Filled 2021-04-15 (×5): qty 1

## 2021-04-15 MED ORDER — OXYCODONE HCL 5 MG PO TABS
5.0000 mg | ORAL_TABLET | ORAL | Status: DC | PRN
  Administered 2021-04-15 – 2021-04-19 (×5): 5 mg via ORAL
  Filled 2021-04-15 (×6): qty 1

## 2021-04-15 NOTE — Plan of Care (Signed)
?  Problem: Health Behavior/Discharge Planning: ?Goal: Ability to manage health-related needs will improve ?Outcome: Progressing ?  ?Problem: Clinical Measurements: ?Goal: Ability to maintain clinical measurements within normal limits will improve ?Outcome: Progressing ?Goal: Will remain free from infection ?Outcome: Progressing ?Goal: Diagnostic test results will improve ?Outcome: Progressing ?Goal: Respiratory complications will improve ?Outcome: Progressing ?Goal: Cardiovascular complication will be avoided ?Outcome: Progressing ?  ?Problem: Activity: ?Goal: Risk for activity intolerance will decrease ?Outcome: Progressing ?  ?Problem: Nutrition: ?Goal: Adequate nutrition will be maintained ?Outcome: Progressing ?  ?Problem: Coping: ?Goal: Level of anxiety will decrease ?Outcome: Progressing ?  ?Problem: Elimination: ?Goal: Will not experience complications related to bowel motility ?Outcome: Progressing ?Goal: Will not experience complications related to urinary retention ?Outcome: Progressing ?  ?Problem: Pain Managment: ?Goal: General experience of comfort will improve ?Outcome: Progressing ?  ?Problem: Safety: ?Goal: Ability to remain free from injury will improve ?Outcome: Progressing ?  ?Problem: Skin Integrity: ?Goal: Risk for impaired skin integrity will decrease ?Outcome: Progressing ?  ?Problem: Education: ?Goal: Expressions of having a comfortable level of knowledge regarding the disease process will increase ?Outcome: Progressing ?  ?Problem: Coping: ?Goal: Ability to adjust to condition or change in health will improve ?Outcome: Progressing ?Goal: Ability to identify appropriate support needs will improve ?Outcome: Progressing ?  ?Problem: Health Behavior/Discharge Planning: ?Goal: Compliance with prescribed medication regimen will improve ?Outcome: Progressing ?  ?Problem: Medication: ?Goal: Risk for medication side effects will decrease ?Outcome: Progressing ?  ?Problem: Clinical  Measurements: ?Goal: Complications related to the disease process, condition or treatment will be avoided or minimized ?Outcome: Progressing ?Goal: Diagnostic test results will improve ?Outcome: Progressing ?  ?Problem: Safety: ?Goal: Verbalization of understanding the information provided will improve ?Outcome: Progressing ?  ?Problem: Self-Concept: ?Goal: Level of anxiety will decrease ?Outcome: Progressing ?Goal: Ability to verbalize feelings about condition will improve ?Outcome: Progressing ?  ?

## 2021-04-15 NOTE — Consult Note (Signed)
?[facility] Followup Face-to-Face Psychiatric Evaluation ? ? ?Service Date: April 15, 2021 ?LOS:  LOS: 100 days  ? ? ?Assessment  ?Asser Dail is a 56 y.o. male admitted medically for 01/05/2021  1:14 PM for full body seizure, AMS and combativeness. He carries the psychiatric diagnoses of MDD and and has a past medical history of Wernicke's encephalopathy and alcoholic dementia .Psychiatry was consulted for agitation by Lala Lund, MD.  ? ? 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 Information on premorbid diagnoses is sparse; at minimum included severe AUD. Outpatient psychotropic medications include Prozac, Trazodone and gabapentin and historically he has had an unknown response to these medications. Focus of treatment in the hospital has been on controlling behaviors and impulsivity; this is difficult given ongoing confabulations (which will not respond to medications and are not being specifically targeted) and pt's relative inability to retain information (such as his need to stay in bed due to being a fall risk). This will likely become easier as he works with PT and is afforded more mobility.  Please see plan below for detailed recommendations. Current over-arching treatment is downtitration of olanzapine due to poor response at high doses, anticholinergic side effect profile (and outside chance it is contributing to transaminitis) while introducing risperidone (please see daily plan for changes). Patient may not require direct equivalent dose of Risperdal, as Zyprexa's anticholinergic effects could be contributing to presentation and Risperdal is less likely to cause same side effects. Would like to increase depakote if LFTs improve as he seemed to have improved behavior at a higher dose; if there is no improvement early in week of 3/13 will likely need to discontinue this medication.  ? ?3/17: Patient  continues to do well without behavior episodes. Patient is ambulating more and communicating his needs. Patient appears to have occasional hallucinations but they appear comforting to patient. Patient transmisnitis appears to be improving with the reduction in Zyprexa and patient behavior is stable.  ? ?3/22: Patient is continuing to do well and has not had any behavioral episodes.  Per sitter during the night patient walked a few times around the unit at 2 AM but had no outbursts.  Patient continues to confabulate today focusing on getting insurance so he can get a rehab bed faster but denies any outright AVH.  As he is not having any behavioral outbursts we will attempt to decrease his Zyprexa and monitor his response to this.  We will not recommend any other medication changes at this time.  ? ? ? ?3/27: Patient did have an incident over the weekend where he confabulated his niece was in the hospital and calling out for help which got him agitated, however, now he no longer thinks this.  Since he admits he has been calmer with the reduction in the Zyprexa we will continue to taper it off making a further reduction in a few days as his first dose at 5 mg was last night.  Since he has had a good response to the Risperdal we will also start a morning dose as well for better mood stability throughout the day.  ? ?3/30: Will adjust depakote and olanzapine as below. Goal is to get depakote therapeutic and hopefully dc keppra which can worsen agitation. Pt had done quite well on depakote prior to dose being reduced 2/2 LFT elevation; feel this is more likely 2/2 olanzapine which is being tapered. Overall cognition  much improved since taper of olanzapine and starting risperidone. Level pended for PM of Sunday.  ? ? ?Diagnoses:  ?Active Hospital problems: ?Principal Problem: ?  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  Skin abnormalities/perineal MASD ?  Physical  deconditioning 2/2 ambulatory dysfunction ?  Right ventricular dysfunction ?  Witnessed seizure ([address]) ?  Eczema ?  Constipation ?  ? ? ?Plan  ?## Safety and Observation Level:  ?- Based on my clinical evaluation, I estimate the patient to be at moderate risk of self harm in the current setting ?- At this time, we recommend a close level of observation. This decision is based on my review of the chart including patient's history and current presentation, interview of the patient, mental status examination, and consideration of suicide risk including evaluating suicidal ideation, plan, intent, suicidal or self-harm behaviors, risk factors, and protective factors. This judgment is based on our ability to directly address suicide risk, implement suicide prevention strategies and develop a safety plan while the patient is in the clinical setting. Please contact our team if there is a concern that risk level has changed. ? ? ?## Medications:  ?-Increase frequency of Risperdal to 1 mg BID QHS ?-Continue Prozac 40mg  ?-Continue Melatonin 10mg  QHS ?-Continue Trazodone 100mg  QHS  ?- DECREASE Zyprexa to 2.5 mg QHS ?- INCREASE depakote to 500 mg/1000 mg ?-Depakote lvl and CMP 4/2 ?-May continue Zyprexa 5mg  q8h PRN for agitation ? ?Klonopin managed by primary team  ? ?## Medical Decision Making Capacity:  ?Not formally assessed ? ?## Further Work-up:  ?-- Per Primary ? ? ? ?-- most recent EKG on 3/8 had QtC of 462 ?-- Pertinent labwork reviewed earlier this admission includes:  ?Depakote lvl (3/17): 35 ? ?## Disposition:  ?-- Per Primary ? ?## Behavioral / Environmental:  ?1: Provide appropriate lighting and clear signage; a clock and calendar should be easily visible to the patient. ?2: Continue to provide activities for patient ( coloring, word search, book) ?3: Reorient the patient to person, place, time and situation on each encounter.  ?4: Minimize use of Antipsychotics PRN. Patient benefits from constant observation by a  sitter. ? ?##Legal Status ? ? ?Thank you for this consult request. Recommendations have been communicated to the primary team.  We will continue to follow at this time.  ? ?Joycelyn Schmid A Pier Bosher ? ? ?Followup history  ?Relevant Aspects of Hospital Course:  ?Admitted on 01/05/2021 for seizures at a sober living facility. 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  ? ?Patient Report:  ?Patient seen around lunch. Expresses frustration with ongoing hospital course. Slept poorly last night due to a party in his room (likely confabulating); not RIS on exam. Was pleasant through evaluation. Discussed rationale for medication changes; pt is excited to potentially eliminate medications (keppra, olanzapine) as he takes "17 pills" every AM. No SI, HI, AH/VH.  ? ? ? ?Collateral information:  ?None ? ?Psychiatric History:  ?MDD, anxiety.  ?Vistaril 50 mg every 4 hours needed for anxiety, Prozac 40 mg daily and trazodone 50 mg at bedtime.  ? ?Family psych history: Not known per EMR ? ? ?Social History:  ? ? ?Tobacco use: Defer ?Alcohol use: Defer ?Drug use: Defer ? ?Family History:  ? ?The patient's family history is not on file. ? ?Medical History: ?History reviewed. No pertinent past medical history. ? ?  Surgical History: ?History reviewed. No pertinent surgical history. ? ?Medications:  ? ?Current Facility-Administered Medications:  ?  acetaminophen (TYLENOL) tablet 650 mg, 650 mg, Oral, Q4H PRN, Shalhoub, Sherryll Burger, MD, 650 mg at 04/14/21 1203 ?  aspirin tablet 325 mg, 325 mg, Oral, Daily, 325 mg at 04/15/21 0900 **OR** [DISCONTINUED] aspirin suppository 300 mg, 300 mg, Rectal, Daily, Greta Doom, MD ?  clonazePAM Bobbye [place]) tablet 0.5 mg, 0.5 mg, Oral, TID, Samella Parr, NP, 0.5 mg at 04/15/21 0900 ?  colchicine tablet 0.3 mg, 0.3 mg, Oral, BID, Samella Parr, NP, 0.3 mg at 04/15/21 1238 ?  diphenhydrAMINE (BENADRYL) capsule 25 mg, 25 mg, Oral, Q6H PRN, [place] Donnamarie Poag, DO ?  docusate sodium (COLACE) capsule 100 mg, 100 mg, Oral, BID, Spero Geralds, MD,

## 2021-04-15 NOTE — TOC Progression Note (Signed)
Transition of Care (TOC) - Progression Note  ? ? ?Patient Details  ?Name: Brylan Hovater ?MRN: [id number] ?Date of Birth: [birth year] ? ?Transition of Care (TOC) CM/SW Contact  ?Carley Hammed, LCSWA ?Phone Number: ?04/15/2021, 12:17 PM ? ?Clinical Narrative:    ?CSW completed IVC paperwork at medical team request. Marybelle Killings was signed, notarized, and approved by the [employer] Three copies were put on the chart, [employer] called to serve. ? ? ?Expected Discharge Plan: Skilled Nursing Facility ?Barriers to Discharge: Continued Medical Work up, Homeless with medical needs, Inadequate or no insurance ? ?Expected Discharge Plan and Services ?Expected Discharge Plan: Skilled Nursing Facility ?In-house Referral: PCP / [facility] ?Discharge Planning Services: CM Consult, MATCH Program, Medication Assistance, Follow-up appt scheduled, Indigent Health Clinic ?  ?Living arrangements for the past 2 months: Group Home (Patient was resident at [facility] prior to hospital admission.) ?Expected Discharge Date: 01/24/21               ?  ?  ?  ?  ?  ?  ?  ?  ?  ?  ? ? ?Social Determinants of Health (SDOH) Interventions ?  ? ?Readmission Risk Interventions ?   ? View : No data to display.  ?  ?  ?  ? ? ?

## 2021-04-15 NOTE — Progress Notes (Signed)
?Progress Note ? ? ?PatientDemetrice [name] ZOX:[id number] DOB: 12/14/1965 DOA: 01/05/2021     100 ?DOS: the patient was seen and examined on 04/15/2021 ?  ?Brief hospital course: ?Steve [name] is a 56 y.o. male with history of major depression, anxiety who was in a residential ETOH treatment center for last 5 months. He was brought to the ED on 12/20 from a residential ETOH treatment center for convalescence, combativeness and altered sensorium.  Per collateral history, patient was not acting typical for last few days. ? ?In the ED, patient was confused, combative. Labs showed AKI with creatinine of 3.27, rhabdomyolysis, abnormal LFTs, elevated WBC count at 23.7 and lactic acid level at 7.6. 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 Blood culture was sent, empiric antibiotic started.  IV fluid resuscitation started and patient was initially admitted to ICU. ?12/21, underwent LP which showed staph in CSF, felt to be contaminant.  EEG did not show any seizures ?12/24, HD catheter was placed but patient did not require HD as creatinine started to improve ?12/31, with clinical improvement, patient was transferred out of ICU to [facility]. ?1/18 Cognitive evaluations: 2 on the SLUMS, a 9 on the MMSE, and a 0 on medi-cog and short blessed test ? ?Assessment and Plan: ?* Cognitive impairment  2/2 Wernicke's encephalopathy and alcoholic dementia ?Initial MRI revealed tiny acute infarct on the right but not significant enough to explain persistent cognitive and behavioral abnormalities. Also evidence of prior lacunar infarct right basal ganglia ?Cognitive screening revealed significant cognitive deficits ?Continue Prozac, Depakote, Klonopin and HS melatonin; on 3/23 I did increase Klonopin from BID to TID at patient request due to increasing anxiety ?Appreciate psychiatric team assistance.  Continue Risperdal  and to taper/discontinue Risperdal ?Likely Depakote would also help so plan is to continue to increase dose to therapeutic levels per psychiatry team ?Continue thiamine-previously had received high-dose IV thiamine earlier in the hospitalization with some improvement in cognition ?Recently patient has had more more restlessness and a desire to leave the hospital not understanding his current condition.  He has been very difficult to redirect.  Have discussed with attending physician and recommendation made for IVC.  Paperwork initiated and MD has signed.  Appropriate sitter orders readjusted. ? ?After discharge recommendations: PLEASE NOTE THAT THIS PT'S REGIMEN OF PSYCHOTROPIC MEDS CAN NEVER BE WEANED AND DC'D DUE TO SEVERITY OF HIS [place] ? ? ? ? ?Witnessed seizure ([place]) ?No apparent prior history of seizure disorder noting patient was not on AEDs prior to admission ?Unclear if withdrawal seizure or related to other substances ?3/10 given recent behavioral changes attending felt EEG needed to be completed to rule out atypical seizure activity causing behavioral manifestations.  EEG unremarkable other than for moderate diffuse encephalopathy.  No seizure activity. ?Continue Keppra at recommendation of neurology ? ?Acute kidney injury with rhabdomyolysis-resolved as of 03/17/2021 ?Resolved ?Creatinine peaked at 7.48 on 12/26.  Nephrology consulted with initial plans to initiate dialysis including placement of [facility] but renal function gradually improved and dialysis not needed and TDC removed ? ?Eczema ?Resolved with use of topical cortisone cream but has reemerged on his chest.  Hopeful that current prednisone will help.  After prednisone completed will likely need to resume topical cortisone cream at a low dose ?  ?2/27 ? ? ?2/27 ? ? ?3/5 ? ? ? ? ?Constipation ?Resolved ? ?Aspiration pneumonia ([name])-resolved as of 03/04/2021 ?Completed  a course of Zosyn ? ?Possible DVT (deep venous thrombosis)  ([name])-resolved as of 03/17/2021 ?Per critical care note, hemodialysis cath had a clot on it when it was removed.  UE duplex  12/31: no DVT.   ? ?Right ventricular dysfunction ?Echo 1/2 with EF 60 to 65%.  Cannot rule out a small PFO this finding determined to be clinically insignificant given the patient was asymptomatic ? ?Elevated transaminases at time of presentation-resolved as of 03/04/2021 ?Likely secondary to sepsis like physiology at presentation ?His AST and ALT were significantly elevated to over 700s on admission.  ?Re emergence mild elevation LFTs-likely due to Zyprexa and Depakote ?Due to oversedation Zyprexa dose decreased ?LFTs have normalized with decrease in Zyprexa dose. ?2/16 valproic acid level 55 ? ? ?Physical deconditioning 2/2 ambulatory dysfunction ?Patient now ambulating without difficulty ? ?A physical therapy consult is indicated based on the patient?s mobility assessment. ? ? ?Mobility Assessment (last 72 hours)   ? ? Mobility Assessment   ? ? [place] Name 03/15/21 1700 03/15/21 1200 03/14/21 1200 03/14/21 0952 03/14/21 0830  ? Does patient have an order for bedrest or is patient medically unstable -- No - Continue assessment -- No - Continue assessment No - Continue assessment  ? What is the highest level of mobility based on the progressive mobility assessment? Level 4 (Walks with assist in room) - Balance while marching in place and cannot step forward and back - Complete Level 4 (Walks with assist in room) - Balance while marching in place and cannot step forward and back - Complete -- Level 4 (Walks with assist in room) - Balance while marching in place and cannot step forward and back - Complete Level 4 (Walks with assist in room) - Balance while marching in place and cannot step forward and back - Complete  ? Is the above level different from baseline mobility prior to current illness? -- Yes - Recommend PT order -- Yes - Recommend PT order Yes - Recommend PT order  ? ? [place] Name 03/13/21  2034 03/13/21 0800  ?  ?  ?  ? Does patient have an order for bedrest or is patient medically unstable No - Continue assessment No - Continue assessment     ? What is the highest level of mobility based on the progressive mobility assessment? Level 4 (Walks with assist in room) - Balance while marching in place and cannot step forward and back - Complete Level 2 (Chairfast) - Balance while sitting on edge of bed and cannot stand     ? Is the above level different from baseline mobility prior to current illness? Yes - Recommend PT order Yes - Recommend PT order     ? ?  ?  ? ?  ? ? ? ? ?Severe sepsis without septic shock ([name])-resolved as of 03/17/2021 ?Ruled out ?Sepsis-like physiology secondary to presentation with profound dehydration, hypoperfusion from dehydration related hypotension seizure activity ?Chest work-up was negative ? ?Skin abnormalities/perineal MASD ?Resolved ? ?Protein calorie malnutrition ([place]) ?Patient able to feed self independently and is eating well ? ? ?Left ankle pain ?Again x-ray unremarkable ?Ankle and foot somewhat reddened and swollen and quite tender.  Pulse palpable. ?Suspect may be experiencing acute gout flare. ?Check uric acid, CRP, ESR ?Begin low-dose prednisone 20 mg daily for 5 days, colchicine.  Add Oxy IR for pain ? ? ? ? ?Subjective:  ?Alert and somewhat agitated and restless.  Once again begins the conversation with the idea that he is going  to leave.  Redirected and began talking about his pain initially he did not want a work-up but after further discussion and examination in a calm voice patient was agreeable to having labs drawn and initiating medications. ? ? ? ?Physical Exam: ?Vitals:  ? 04/14/21 2027 04/15/21 0005 04/15/21 2778 04/15/21 2423  ?BP: 107/82 105/66 98/68 108/82  ?Pulse: 85 89 81 94  ?Resp: _0 ?Temp: (!) 97.5 ?F (36.4 ?C) 98.6 ?F (37 ?C) 98.1 ?F (36.7 ?C) 97.7 ?F (36.5 ?C)  ?TempSrc: Oral Oral Oral Oral  ?SpO2: 97% 98% 99% 100%  ?Weight:       ?Height:      ? ?Constitutional: Awake and complaining of pain in left foot, restless but currently redirectable although this has not been the case through most of the night ?Respiratory: Stable on room air, anterior lu

## 2021-04-15 NOTE — Progress Notes (Signed)
Speech Language Pathology Treatment: Cognitive-Linquistic  ?Patient Details ?Name: Steve [name] ?MRN: [id number] ?DOB: May 16, 1965 ?Today's Date: 04/15/2021 ?Time: 2505-3976 ?SLP Time Calculation (min) (ACUTE ONLY): 31 min ? ?Assessment / Plan / Recommendation ?Clinical Impression ? Pt was seen for cognitive-linguistic treatment. He was alert and cooperative, and required cues for temporal orientation to day and date. He completed a mental manipulation (4-word sentence scramble) task with 70% accuracy increasing to 80% with repetition and cues. He demonstrated 75% accuracy with an app-based attention task given verbal prompts. He exhibited an average response time of 22.79 seconds with a range of 9.90-32.29 seconds. With use of external memory aids, pt achieved 70% accuracy with recall of concrete information from voice mails increasing to 100% with cues. SLP will continue to follow pt.   ?  ?HPI HPI: Pt is a 56 y.o. male admitted from group home (substance abuse rehab?) on 01/05/21 with witnessed seizure, AMS. 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 EEG unremarkable. LP on 12/21 showed staph in CSF, felt to be contaminant. Workup for AKI, rhabdomyolysis. Dx Uremic encephalopathy. Course complicated by c/o L foot pain; imaging negative for acute injury. Pt remains hospitalized due to difficulty with placement. SLP evaluation on 01/26/21: 9/27 on Mini Mental State Exam, SLUMS 02/03/21: 2/30; SLUMS 1/27: 1/30. Psych consulted and as of 1/16, pt did not meet criteria for inpatient psychiatric admission. ?  ?   ?SLP Plan ? Continue with current plan of care ? ?  ?  ?Recommendations for follow up therapy are one component of a multi-disciplinary discharge planning process, led by the attending physician.  Recommendations may be updated based on patient status, additional functional criteria and  insurance authorization. ?  ? ?Recommendations  ?   ?   ?    ?   ? ? ? ? Oral Care Recommendations: Oral care BID ?Follow Up Recommendations: Skilled nursing-short term rehab (<3 hours/day) ?Assistance recommended at discharge: Frequent or constant Supervision/Assistance ?SLP Visit Diagnosis: Cognitive communication deficit (R41.841);Attention and concentration deficit ?Attention and concentration deficit following: Other cerebrovascular disease ?Plan: Continue with current plan of care ? ? ? ? ?  ?  ?Steve [name], Steve [name], Steve [name] ?Acute Rehabilitation Services ?Office number [phone] ?Pager [phone] ? ? ?Steve [name] ? ?04/15/2021, 2:54 PM ? ? ? ? ? ?

## 2021-04-16 NOTE — Progress Notes (Signed)
?Progress Note ? ? ?PatientNathanyl [name] VQQ:[id number] DOB: 03-07-1965 DOA: 01/05/2021     101 ?DOS: the patient was seen and examined on 04/16/2021 ?  ?Brief hospital course: ?Steve [name] is a 56 y.o. male with history of major depression, anxiety who was in a residential ETOH treatment [name] for last 5 months. He was brought to the ED on 12/20 from a residential ETOH treatment [name] for convalescence, combativeness and altered sensorium.  Per collateral history, patient was not acting typical for last few days. ? ?In the ED, patient was confused, combative. Labs showed AKI with creatinine of 3.27, rhabdomyolysis, abnormal LFTs, elevated WBC count at 23.7 and lactic acid level at 7.6. 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 Blood culture was sent, empiric antibiotic started.  IV fluid resuscitation started and patient was initially admitted to ICU. ?12/21, underwent LP which showed staph in CSF, felt to be contaminant.  EEG did not show any seizures ?12/24, HD catheter was placed but patient did not require HD as creatinine started to improve ?12/31, with clinical improvement, patient was transferred out of ICU to [name]. ?1/18 Cognitive evaluations: 2 on the SLUMS, a 9 on the MMSE, and a 0 on medi-cog and short blessed test ? ?Assessment and Plan: ?* Cognitive impairment  2/2 Wernicke's encephalopathy and alcoholic dementia ?Initial MRI revealed tiny acute infarct on the right but not significant enough to explain persistent cognitive and behavioral abnormalities. Also evidence of prior lacunar infarct right basal ganglia ?Cognitive screening revealed significant cognitive deficits ?Continue Prozac, Depakote, Klonopin and HS melatonin; on 3/23 I did increase Klonopin from BID to TID at patient request due to increasing anxiety ?Appreciate psychiatric team assistance.  Medication regimen as  follows: ?-Increase frequency of Risperdal to 1 mg BID QHS ?-Continue Prozac 84m ?-Continue Melatonin 123mQHS ?-Continue Trazodone 10023mHS  ?- DECREASE Zyprexa to 2.5 mg QHS ?- INCREASE depakote to 500 mg/1000 mg ?-Depakote lvl and CMP 4/2 ?-May continue Zyprexa 5mg73mh PRN for agitation ?Currently under IVC due to persistent wandering behaviors and threats to leave Steve [name]? ?After discharge recommendations: PLEASE NOTE THAT THIS PT'S REGIMEN OF PSYCHOTROPIC MEDS CAN NEVER BE WEANED AND DC'D DUE TO SEVERITY OF HIS [place] ? ? ?Witnessed seizure ([name])Steve [name] apparent prior history of seizure disorder noting patient was not on AEDs prior to admission ?Unclear if withdrawal seizure or related to other substances ?3/10 given recent behavioral changes attending felt EEG needed to be completed to rule out atypical seizure activity causing behavioral manifestations.  EEG unremarkable other than for moderate diffuse encephalopathy.  No seizure activity. ?Continue Keppra at recommendation of neurology ? ?Acute kidney injury with rhabdomyolysis-resolved as of 03/17/2021 ?Resolved ?Creatinine peaked at 7.48 on 12/26.  Nephrology consulted with initial plans to initiate dialysis including placement of TDC [facility] renal function gradually improved and dialysis not needed and TDC removed ? ?Eczema ?Resolved with use of topical cortisone cream but has reemerged on his chest.  Hopeful that current prednisone will help.  After prednisone completed will likely need to resume topical cortisone cream at a low dose ?  ?2/27 ? ? ?2/27 ? ? ?3/5 ? ? ? ? ?Constipation ?Resolved ? ?Aspiration pneumonia ([name])-resolved as of 03/04/2021 ?Completed a course of Zosyn ? ?Possible DVT (deep venous thrombosis) ([name])-resolved as of 03/17/2021 ?Per critical care note, hemodialysis cath had a clot  on it when it was removed.  UE duplex  12/31: no DVT.   ? ?Right ventricular dysfunction ?Echo 1/2 with EF 60 to 65%.  Cannot rule  out a small PFO this finding determined to be clinically insignificant given the patient was asymptomatic ? ?Elevated transaminases at time of presentation-resolved as of 03/04/2021 ?Likely secondary to sepsis like physiology at presentation ?His AST and ALT were significantly elevated to over 700s on admission.  ?Re emergence mild elevation LFTs-likely due to Zyprexa and Depakote ?Due to oversedation Zyprexa dose decreased ?LFTs have normalized with decrease in Zyprexa dose. ?2/16 valproic acid level 55 ? ? ?Physical deconditioning 2/2 ambulatory dysfunction ?Patient now ambulating without difficulty ? ?A physical therapy consult is indicated based on the patient?s mobility assessment. ? ? ?Mobility Assessment (last 72 hours)   ? ? Mobility Assessment   ? ? [place] Name 03/15/21 1700 03/15/21 1200 03/14/21 1200 03/14/21 0952 03/14/21 0830  ? Does patient have an order for bedrest or is patient medically unstable -- No - Continue assessment -- No - Continue assessment No - Continue assessment  ? What is the highest level of mobility based on the progressive mobility assessment? Level 4 (Walks with assist in room) - Balance while marching in place and cannot step forward and back - Complete Level 4 (Walks with assist in room) - Balance while marching in place and cannot step forward and back - Complete -- Level 4 (Walks with assist in room) - Balance while marching in place and cannot step forward and back - Complete Level 4 (Walks with assist in room) - Balance while marching in place and cannot step forward and back - Complete  ? Is the above level different from baseline mobility prior to current illness? -- Yes - Recommend PT order -- Yes - Recommend PT order Yes - Recommend PT order  ? ? [place] Name 03/13/21 2034 03/13/21 0800  ?  ?  ?  ? Does patient have an order for bedrest or is patient medically unstable No - Continue assessment No - Continue assessment     ? What is the highest level of mobility based on the  progressive mobility assessment? Level 4 (Walks with assist in room) - Balance while marching in place and cannot step forward and back - Complete Level 2 ([place]) - Balance while sitting on edge of bed and cannot stand     ? Is the above level different from baseline mobility prior to current illness? Yes - Recommend PT order Yes - Recommend PT order     ? ?  ?  ? ?  ? ? ? ? ?Severe sepsis without septic shock ([name])-resolved as of 03/17/2021 ?Ruled out ?Sepsis-like physiology secondary to presentation with profound dehydration, hypoperfusion from dehydration related hypotension seizure activity ?Chest work-up was negative ? ?Skin abnormalities/perineal MASD ?Resolved ? ?Protein calorie malnutrition ([place]) ?Patient able to feed self independently and is eating well ? ? ?Left ankle pain ?Again x-ray unremarkable ?Ankle and foot somewhat reddened and swollen and quite tender.  Pulse palpable. ?Suspect may be experiencing acute gout flare. ?Check uric acid, CRP, ESR ?Begin low-dose prednisone 20 mg daily for 5 days, colchicine.  Add Oxy IR for pain ? ? ? ? ?Subjective:  ?Very restless, agitated, paranoid, confabulatory and confused today.  Was packing all of his belongings in a walking boot and stating he was leaving that he could not take it any longer.  His rationale for needing to leave was that he  has been told for the past 30 years that he could go to a picnic with his family and he was missing his family and that he needed to go.  When discussing not safe to discharge and needing to stay here he stated again that he was tired of being told by that man that he would have a place to go soon.  He stated he could probably go home with his brother and sister and I asked him if he had spoken to either one of them and he said his brother would call his sister but never stated he had spoken to any family.  I calmly spoke with the patient for about 30 minutes trying to explain his illness to him and probably at the time  and the immediacy of the situation he seemed to understand but again within 15 minutes had forgotten what I had told him.  He eventually took his belongings and put them back into the hospital closet.  St

## 2021-04-16 NOTE — TOC Progression Note (Addendum)
Transition of Care (TOC) - Progression Note  ? ? ?Patient Details  ?Name: Abdias Neyens ?MRN: [id number] ?Date of Birth: April 28, 1965 ? ?Transition of Care (TOC) CM/SW Contact  ?Janae Bridgeman, RN ?Phone Number: ?04/16/2021, 1:21 PM ? ?Clinical Narrative:    ?CM spoke with [name], DSS SW and she states that she is aware that the patient has been IVC'd to keep him from leaving AMA from the hospital.  The patient does not have the capacity to make medical decisions for himself and Mayford Knife, [place] SW has applied for court date to ask that the patient's son be chosen as the patient's guardian.  Court date is pending and if an available SNF facility with memory care unit is able to offer a bed - then the patient can be discharged from the hospital prior to this court date.  Mayford Knife, SW states that the patient's son is in agreement to sign him in to a nursing home for care. ? ?CM and MSW will continue to follow the patient for SNF placement - likely [facility]. ? ?04/16/2021 1332 - Voice and text message left with Osvaldo Angst, CM at [facility] for likely LTC placement. ? ? ?Expected Discharge Plan: Skilled Nursing Facility ?Barriers to Discharge: Continued Medical Work up, Homeless with medical needs, Inadequate or no insurance ? ?Expected Discharge Plan and Services ?Expected Discharge Plan: Skilled Nursing Facility ?In-house Referral: PCP / [facility] ?Discharge Planning Services: CM Consult, MATCH Program, Medication Assistance, Follow-up appt scheduled, Indigent Health Clinic ?  ?Living arrangements for the past 2 months: Group Home (Patient was resident at [facility] prior to hospital admission.) ?Expected Discharge Date: 01/24/21               ?  ?  ?  ?  ?  ?  ?  ?  ?  ?  ? ? ?Social Determinants of Health (SDOH) Interventions ?  ? ?Readmission Risk Interventions ?   ? View : No data to display.  ?  ?  ?  ? ? ?

## 2021-04-16 NOTE — Consult Note (Signed)
Steve [name] ? ? ?Service Date: April 16, 2021 ?LOS:  LOS: 101 days  ? ? ?Assessment  ?Steve [name] is a 56 y.o. male admitted medically for 01/05/2021  1:14 PM for full body seizure, AMS and combativeness. He carries the psychiatric diagnoses of MDD and and has a past medical history of Wernicke's encephalopathy and alcoholic dementia .Psychiatry was consulted for agitation by Susa Raring, MD.  ? ? 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 Information on premorbid diagnoses is sparse; at minimum included severe AUD. Outpatient psychotropic medications include Prozac, Trazodone and gabapentin and historically he has had an unknown response to these medications. Focus of treatment in the hospital has been on controlling behaviors and impulsivity; this is difficult given ongoing confabulations (which will not respond to medications and are not being specifically targeted) and pt's relative inability to retain information (such as his need to stay in bed due to being a fall risk). This will likely become easier as he works with PT and is afforded more mobility.  Please see plan below for detailed recommendations. Current over-arching treatment is downtitration of olanzapine due to poor response at high doses, anticholinergic side effect profile (and outside chance it is contributing to transaminitis) while introducing risperidone (please see daily plan for changes). Patient may not require direct equivalent dose of Risperdal, as Zyprexa's anticholinergic effects could be contributing to presentation and Risperdal is less likely to cause same side effects. Would like to increase depakote if LFTs improve as he seemed to have improved behavior at a higher dose; if there is no improvement early in week of 3/13 will likely need to discontinue this medication.  ? ?3/17: Patient  continues to do well without behavior episodes. Patient is ambulating more and communicating his needs. Patient appears to have occasional hallucinations but they appear comforting to patient. Patient transmisnitis appears to be improving with the reduction in Zyprexa and patient behavior is stable.  ? ?3/22: Patient is continuing to do well and has not had any behavioral episodes.  Per sitter during the night patient walked a few times around the unit at 2 AM but had no outbursts.  Patient continues to confabulate today focusing on getting insurance so he can get a rehab bed faster but denies any outright AVH.  As he is not having any behavioral outbursts we will attempt to decrease his Zyprexa and monitor his response to this.  We will not recommend any other medication changes at this time.  ? ? ? ?3/27: Patient did have an incident over the weekend where he confabulated his niece was in the hospital and calling out for help which got him agitated, however, now he no longer thinks this.  Since he admits he has been calmer with the reduction in the Zyprexa we will continue to taper it off making a further reduction in a few days as his first dose at 5 mg was last night.  Since he has had a good response to the Risperdal we will also start a morning dose as well for better mood stability throughout the day.  ? ?3/30: Will adjust depakote and olanzapine as below. Goal is to get depakote therapeutic and hopefully dc keppra which can worsen agitation. Pt had done quite well on depakote prior to dose being reduced 2/2 LFT elevation; feel this is more likely 2/2 olanzapine which is being tapered. Overall cognition  much improved since taper of olanzapine and starting risperidone. Level pended for PM of Sunday.  ? ?3/31: no s/e from inc depakote minimal change otherwise. Will likely sign off after olanzapine stopped and depakote therapeutic.  ? ? ?Diagnoses:  ?Active Hospital problems: ?Principal Problem: ?  Cognitive  impairment  2/2 Wernicke's encephalopathy and alcoholic dementia ?Active Problems: ?  Skin abnormalities/perineal MASD ?  Physical deconditioning 2/2 ambulatory dysfunction ?  Right ventricular dysfunction ?  Witnessed seizure ([name]) ?  Eczema ?  Constipation ?  ? ? ?Plan  ?## Safety and Observation Level:  ?- Based on my clinical [name], I estimate the patient to be at moderate risk of self harm in the current setting ?- At this time, we recommend a close level of observation. This decision is based on my review of the chart including patient's history and current presentation, interview of the patient, mental status examination, and consideration of suicide risk including evaluating suicidal ideation, plan, intent, suicidal or self-harm behaviors, risk factors, and protective factors. This judgment is based on our ability to directly address suicide risk, implement suicide prevention strategies and develop a safety plan while the patient is in the clinical setting. Please contact our team if there is a concern that risk level has changed. ? ? ?## Medications:  ?-Increase frequency of Risperdal to 1 mg BID QHS ?-Continue Prozac 40mg  ?-Continue Melatonin 10mg  QHS ?-Continue Trazodone 100mg  QHS  ?- c  Zyprexa to 2.5 mg QHS ?- c  depakote to 500 mg/1000 mg ?-Depakote lvl and CMP 4/2 ?-May continue Zyprexa 5mg  q8h PRN for agitation ? ?Klonopin managed by primary team  ? ?## Medical Decision Making Capacity:  ?Not formally assessed ? ?## Further Work-up:  ?-- Per Primary ? ? ? ?-- most recent EKG on 3/8 had QtC of 462 ?-- Pertinent labwork reviewed earlier this admission includes:  ?Depakote lvl (3/17): 35 ? ?## Disposition:  ?-- Per Primary ? ?## Behavioral / Environmental:  ?1: Provide appropriate lighting and clear signage; a clock and calendar should be easily visible to the patient. ?2: Continue to provide activities for patient ( coloring, word search, book) ?3: Reorient the patient to person, place, time and  situation on each encounter.  ?4: Minimize use of Antipsychotics PRN. Patient benefits from constant observation by a sitter. ? ?##Legal Status ? ? ?Thank you for this consult request. Recommendations have been communicated to the primary team.  We will continue to follow at this time.  ? ?Claris CheMargaret A Whit Bruni ? ? ?Followup history  ?Relevant Aspects of Hospital Course:  ?Admitted on 01/05/2021 for seizures at a sober living facility. 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  ? ?Patient Report:  ?Patient seen after lunch. Expresses frustration with ongoing hospital course. Slept much better after medication changes last night; has some paranoia directed through hospital staff. Was pleasant to psychiatry team through [name]. Overall minimal change over past several evaluations. No SI, HI, AH/VH.  ? ? ? ?Collateral information:  ?None ? ?Psychiatric History:  ?MDD, anxiety.  ?Vistaril 50 mg every 4 hours needed for anxiety, Prozac 40 mg daily and trazodone 50 mg at bedtime.  ? ?Family psych history: Not known per EMR ? ? ?Social History:  ? ? ?Tobacco use: Defer ?Alcohol use: Defer ?Drug use: Defer ? ?Family History:  ? ?The patient's family history is not on file. ? ?  Medical History: ?History reviewed. No pertinent past medical history. ? ?Surgical History: ?History reviewed. No pertinent surgical history. ? ?Medications:  ? ?Current Facility-Administered Medications:  ?  acetaminophen (TYLENOL) tablet 650 mg, 650 mg, Oral, Q4H PRN, Shalhoub, Deno Lunger, MD, 650 mg at 04/14/21 1203 ?  aspirin tablet 325 mg, 325 mg, Oral, Daily, 325 mg at 04/16/21 0841 **OR** [DISCONTINUED] aspirin suppository 300 mg, 300 mg, Rectal, Daily, Rejeana Brock, MD ?  clonazePAM Scarlette Calico) tablet 0.5 mg, 0.5 mg, Oral, TID, Russella Dar, NP, 0.5 mg at 04/16/21 0841 ?   colchicine tablet 0.3 mg, 0.3 mg, Oral, BID, Russella Dar, NP, 0.3 mg at 04/16/21 9629 ?  diphenhydrAMINE (BENADRYL) capsule 25 mg, 25 mg, Oral, Q6H PRN, [place], Alvira Philips, DO, 25 mg at 04/15/21 2114 ?  d

## 2021-04-16 NOTE — Plan of Care (Signed)
?  Problem: Health Behavior/Discharge Planning: ?Goal: Ability to manage health-related needs will improve ?Outcome: Progressing ?  ?Problem: Clinical Measurements: ?Goal: Ability to maintain clinical measurements within normal limits will improve ?Outcome: Progressing ?Goal: Will remain free from infection ?Outcome: Progressing ?Goal: Diagnostic test results will improve ?Outcome: Progressing ?Goal: Respiratory complications will improve ?Outcome: Progressing ?Goal: Cardiovascular complication will be avoided ?Outcome: Progressing ?  ?Problem: Activity: ?Goal: Risk for activity intolerance will decrease ?Outcome: Progressing ?  ?Problem: Nutrition: ?Goal: Adequate nutrition will be maintained ?Outcome: Progressing ?  ?Problem: Coping: ?Goal: Level of anxiety will decrease ?Outcome: Progressing ?  ?Problem: Elimination: ?Goal: Will not experience complications related to bowel motility ?Outcome: Progressing ?Goal: Will not experience complications related to urinary retention ?Outcome: Progressing ?  ?Problem: Pain Managment: ?Goal: General experience of comfort will improve ?Outcome: Progressing ?  ?Problem: Safety: ?Goal: Ability to remain free from injury will improve ?Outcome: Progressing ?  ?Problem: Skin Integrity: ?Goal: Risk for impaired skin integrity will decrease ?Outcome: Progressing ?  ?Problem: Education: ?Goal: Expressions of having a comfortable level of knowledge regarding the disease process will increase ?Outcome: Progressing ?  ?Problem: Coping: ?Goal: Ability to adjust to condition or change in health will improve ?Outcome: Progressing ?Goal: Ability to identify appropriate support needs will improve ?Outcome: Progressing ?  ?Problem: Health Behavior/Discharge Planning: ?Goal: Compliance with prescribed medication regimen will improve ?Outcome: Progressing ?  ?Problem: Medication: ?Goal: Risk for medication side effects will decrease ?Outcome: Progressing ?  ?Problem: Clinical  Measurements: ?Goal: Complications related to the disease process, condition or treatment will be avoided or minimized ?Outcome: Progressing ?Goal: Diagnostic test results will improve ?Outcome: Progressing ?  ?Problem: Safety: ?Goal: Verbalization of understanding the information provided will improve ?Outcome: Progressing ?  ?Problem: Self-Concept: ?Goal: Level of anxiety will decrease ?Outcome: Progressing ?Goal: Ability to verbalize feelings about condition will improve ?Outcome: Progressing ?  ?

## 2021-04-17 LAB — CBC
HCT: 33.3 % — ABNORMAL LOW (ref 39.0–52.0)
Hemoglobin: 11.5 g/dL — ABNORMAL LOW (ref 13.0–17.0)
MCH: 34.1 pg — ABNORMAL HIGH (ref 26.0–34.0)
MCHC: 34.5 g/dL (ref 30.0–36.0)
MCV: 98.8 fL (ref 80.0–100.0)
Platelets: 259 10*3/uL (ref 150–400)
RBC: 3.37 MIL/uL — ABNORMAL LOW (ref 4.22–5.81)
RDW: 14.6 % (ref 11.5–15.5)
WBC: 8.2 10*3/uL (ref 4.0–10.5)
nRBC: 0 % (ref 0.0–0.2)

## 2021-04-17 LAB — BASIC METABOLIC PANEL
Anion gap: 6 (ref 5–15)
BUN: 21 mg/dL — ABNORMAL HIGH (ref 6–20)
CO2: 25 mmol/L (ref 22–32)
Calcium: 8.8 mg/dL — ABNORMAL LOW (ref 8.9–10.3)
Chloride: 107 mmol/L (ref 98–111)
Creatinine, Ser: 0.55 mg/dL — ABNORMAL LOW (ref 0.61–1.24)
GFR, Estimated: >60 mL/min (ref >60)
Glucose, Bld: 89 mg/dL (ref 70–99)
Potassium: 3.6 mmol/L (ref 3.5–5.1)
Sodium: 138 mmol/L (ref 135–145)

## 2021-04-17 LAB — MAGNESIUM: Magnesium: 2.3 mg/dL (ref 1.7–2.4)

## 2021-04-17 MED ORDER — POTASSIUM CHLORIDE CRYS ER 20 MEQ PO TBCR
30.0000 meq | EXTENDED_RELEASE_TABLET | Freq: Once | ORAL | Status: AC
  Administered 2021-04-17: 30 meq via ORAL
  Filled 2021-04-17: qty 1

## 2021-04-17 NOTE — Progress Notes (Signed)
?PROGRESS NOTE ? ? ? ?Steve [name]  RJJ:[id number] DOB: 04-28-1965 DOA: 01/05/2021 ?PCP: No primary care provider on file.  ? ? ?Brief Narrative:  ?Steve [name] is a 56 year old male with past medical history significant for major depressive disorder, anxiety who presented to [facility] ED on 12/20 from a residential EtOH treatment facility in which she was there for the past 5 months for combativeness and altered mental status.  On arrival to the ED, patient noted to be confused, combative with labs showing acute renal failure, elevated CK level consistent with rhabdomyolysis, abnormal LFTs, WBC count elevated 23.7 and lactic acid of 7.6.  CT head negative for acute bleeding/infarct.  Blood cultures were drawn and patient was started on empiric antibiotics for concern of infection.  Patient was initially admitted to the intensive care unit and underwent lumbar puncture that was unrevealing, EEG without seizure activity.  Patient was transferred out of the ICU to hospitalist care on 01/16/2022.  Patient remains hospitalized due to unsafe discharge home alone and will need placement which has been difficult so far. ? ? ?Assessment & Plan: ?  ?Principal Problem: ?  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  Skin abnormalities/perineal MASD ?  Physical deconditioning 2/2 ambulatory dysfunction ?  Right ventricular dysfunction ?  Witnessed seizure ([name]) ?  Eczema ?  Constipation ? ? ?Cognitive impairment ?Wernicke's encephalopathy ?Alcoholic dementia ?Major depressive disorder ?--Klonopin 0.5 mg p.o. 3 times daily ?--Fluoxetine 40 mg p.o. daily ?--Risperidone 1 mg p.o. BID ?--Valproic acid 500 mg p.o. every morning, 1000 mg p.o. every afternoon ?--Olanzapine 2.5 mg qHS ?--Trazodone 100 mg p.o. nightly ?--Continue IVC due to persistent wandering behaviors and threatening to leave AMA; in which patient does not have capacity for medical decision-making. ? ?Seizure ?--Keppra  500mg  PO BID ?--Valproic acid 500 mg qAm and 1000mg  qPM ? ?Acute renal failure ?Rhabdomyolysis ?Creatinine peaked at 7.48 on 01/11/2022.  Nephrology was initially consulted with initial plans to initiate dialysis but renal function gradually improved and dialysis was not needed.  Now resolved. ? ?Physical deconditioning ?--Continue PT/OT efforts ?--TOC for placement ? ? ? ?DVT prophylaxis: rivaroxaban (XARELTO) tablet 10 mg Start: 02/27/21 1000 ?rivaroxaban (XARELTO) tablet 10 mg  ? ?  Code Status: Full Code ?Family Communication: No family present ? ?Disposition Plan:  ?Level of care: Med-Surg ?Status is: Inpatient ? ?Remains inpatient appropriate because: Pending placement ? ? ?  ? ?Consultants:  ?Psychiatry ?Nephrology ?PCCM ? ?Procedures:  ?EEG ?Echocardiogram ?Temporary dialysis catheter now discontinued ? ?Antimicrobials:  ?Unasyn x1 dose, cefepime x1 dose, ceftriaxone x1 dose, vancomycin x2 doses ? ? ?Subjective: ?Patient seen examined bedside, resting company.  Sleeping but easily arousable.  Sitter present.  No specific complaints this morning.  Denies headache, no chest pain, shortness of breath, no abdominal pain.  No acute events overnight per nurse staff.  Remains difficult placement, unsafe for discharge home alone. ? ?Objective: ?Vitals:  ? 04/16/21 0800 04/16/21 1720 04/16/21 2300 04/17/21 0700  ?BP:  100/78 101/80 105/66  ?Pulse:  91 92   ?Resp:  18 18 18   ?Temp: 97.8 ?F (36.6 ?C) 98.1 ?F (36.7 ?C) 98.1 ?F (36.7 ?C) 98.2 ?F (36.8 ?C)  ?TempSrc:  Axillary Oral Oral  ?SpO2:  98% 98% 100%  ?Weight:      ?Height:      ? ? ?Intake/Output Summary (Last 24 hours) at 04/17/2021 1115 ?Last data filed at 04/16/2021 1755 ?Gross per 24 hour  ?Intake 760 ml  ?Output --  ?  Net 760 ml  ? ?Filed Weights  ? 02/27/21 0501 03/05/21 0500 03/06/21 0500  ?Weight: 60.7 kg 57.9 kg 64.3 kg  ? ? ?Examination: ? ?General exam: Appears calm and comfortable  ?Respiratory system: Clear to auscultation. Respiratory effort normal.  On  room air ?Cardiovascular system: S1 & S2 heard, RRR. No JVD, murmurs, rubs, gallops or clicks. No pedal edema. ?Gastrointestinal system: Abdomen is nondistended, soft and nontender. No organomegaly or masses felt. Normal bowel sounds heard. ?Central nervous system: Alert and oriented. No focal neurological deficits. ?Extremities: Symmetric 5 x 5 power. ?Psychiatry: Judgement and insight appear poor. Mood & affect appropriate.  ? ? ? ?Data Reviewed: I have personally reviewed following labs and imaging studies ? ?CBC: ?Recent Labs  ?Lab 04/14/21 ?0128 04/17/21 ?0223  ?WBC 6.5 8.2  ?NEUTROABS 2.0  --   ?HGB 10.7* 11.5*  ?HCT 32.2* 33.3*  ?MCV 99.7 98.8  ?PLT 247 259  ? ?Basic Metabolic Panel: ?Recent Labs  ?Lab 04/14/21 ?0128 04/17/21 ?0223  ?NA 138 138  ?K 4.0 3.6  ?CL 106 107  ?CO2 27 25  ?GLUCOSE 91 89  ?BUN 24* 21*  ?CREATININE 0.77 0.55*  ?CALCIUM 9.1 8.8*  ?MG  --  2.3  ? ?GFR: ?Estimated Creatinine Clearance: 94.9 mL/min (A) (by C-G formula based on SCr of 0.55 mg/dL (L)). ?Liver Function Tests: ?Recent Labs  ?Lab 04/14/21 ?0128  ?AST 28  ?ALT 41  ?ALKPHOS 38  ?BILITOT 0.5  ?PROT 6.4*  ?ALBUMIN 3.5  ? ?No results for input(s): LIPASE, AMYLASE in the last 168 hours. ?No results for input(s): AMMONIA in the last 168 hours. ?Coagulation Profile: ?No results for input(s): INR, PROTIME in the last 168 hours. ?Cardiac Enzymes: ?No results for input(s): CKTOTAL, CKMB, CKMBINDEX, TROPONINI in the last 168 hours. ?BNP (last 3 results) ?No results for input(s): PROBNP in the last 8760 hours. ?HbA1C: ?No results for input(s): HGBA1C in the last 72 hours. ?CBG: ?No results for input(s): GLUCAP in the last 168 hours. ?Lipid Profile: ?No results for input(s): CHOL, HDL, LDLCALC, TRIG, CHOLHDL, LDLDIRECT in the last 72 hours. ?Thyroid Function Tests: ?No results for input(s): TSH, T4TOTAL, FREET4, T3FREE, THYROIDAB in the last 72 hours. ?Anemia Panel: ?No results for input(s): VITAMINB12, FOLATE, FERRITIN, TIBC, IRON,  RETICCTPCT in the last 72 hours. ?Sepsis Labs: ?No results for input(s): PROCALCITON, LATICACIDVEN in the last 168 hours. ? ?No results found for this or any previous visit (from the past 240 hour(s)).  ? ? ? ? ? ?Radiology Studies: ?No results found. ? ? ? ? ? ?Scheduled Meds: ? aspirin  325 mg Oral Daily  ? clonazePAM  0.5 mg Oral TID  ? colchicine  0.3 mg Oral BID  ? docusate sodium  100 mg Oral BID  ? feeding supplement  237 mL Oral TID AC & HS  ? FLUoxetine  40 mg Oral Daily  ? folic acid  1 mg Oral Daily  ? gabapentin  100 mg Oral BID  ? haloperidol lactate  5 mg Intramuscular Once  ? levETIRAcetam  500 mg Oral BID  ? mouth rinse  15 mL Mouth Rinse BID  ? melatonin  10 mg Oral QHS  ? nicotine  21 mg Transdermal Daily  ? OLANZapine  2.5 mg Oral QHS  ? polyethylene glycol  17 g Oral Daily  ? predniSONE  20 mg Oral Q breakfast  ? risperiDONE  1 mg Oral BID  ? rivaroxaban  10 mg Oral Daily  ? thiamine  250  mg Oral Daily  ? traZODone  100 mg Oral QHS  ? valproic acid  1,000 mg Oral QHS  ? valproic acid  500 mg Oral q morning  ? ?Continuous Infusions: ? ? LOS: 102 days  ? ? ?Time spent: 49 minutes spent on chart review, discussion with nursing staff, consultants, updating family and interview/physical exam; more than 50% of that time was spent in counseling and/or coordination of care. ? ? ? ?Alvira Philips [place], DO ?[facility] ?Available via Epic secure chat 7am-7pm ?After these hours, please refer to coverage provider listed on [email or web address] ?04/17/2021, 11:15 AM  ? ?

## 2021-04-18 LAB — COMPREHENSIVE METABOLIC PANEL
ALT: 54 U/L — ABNORMAL HIGH (ref 0–44)
AST: 33 U/L (ref 15–41)
Albumin: 3.9 g/dL (ref 3.5–5.0)
Alkaline Phosphatase: 34 U/L — ABNORMAL LOW (ref 38–126)
Anion gap: 7 (ref 5–15)
BUN: 26 mg/dL — ABNORMAL HIGH (ref 6–20)
CO2: 25 mmol/L (ref 22–32)
Calcium: 9.3 mg/dL (ref 8.9–10.3)
Chloride: 104 mmol/L (ref 98–111)
Creatinine, Ser: 0.84 mg/dL (ref 0.61–1.24)
GFR, Estimated: >60 mL/min (ref >60)
Glucose, Bld: 93 mg/dL (ref 70–99)
Potassium: 4.3 mmol/L (ref 3.5–5.1)
Sodium: 136 mmol/L (ref 135–145)
Total Bilirubin: 0.5 mg/dL (ref 0.3–1.2)
Total Protein: 7.3 g/dL (ref 6.5–8.1)

## 2021-04-18 LAB — VALPROIC ACID LEVEL: Valproic Acid Lvl: 36 ug/mL — ABNORMAL LOW (ref 50.0–100.0)

## 2021-04-18 MED ORDER — LORAZEPAM 1 MG PO TABS
1.0000 mg | ORAL_TABLET | Freq: Once | ORAL | Status: AC
  Administered 2021-04-18: 1 mg via ORAL
  Filled 2021-04-18: qty 1

## 2021-04-18 NOTE — Plan of Care (Signed)
?  Problem: Health Behavior/Discharge Planning: ?Goal: Ability to manage health-related needs will improve ?Outcome: Progressing ?  ?Problem: Clinical Measurements: ?Goal: Ability to maintain clinical measurements within normal limits will improve ?Outcome: Progressing ?Goal: Will remain free from infection ?Outcome: Progressing ?Goal: Diagnostic test results will improve ?Outcome: Progressing ?Goal: Respiratory complications will improve ?Outcome: Progressing ?Goal: Cardiovascular complication will be avoided ?Outcome: Progressing ?  ?Problem: Activity: ?Goal: Risk for activity intolerance will decrease ?Outcome: Progressing ?  ?Problem: Nutrition: ?Goal: Adequate nutrition will be maintained ?Outcome: Progressing ?  ?Problem: Coping: ?Goal: Level of anxiety will decrease ?Outcome: Progressing ?  ?Problem: Elimination: ?Goal: Will not experience complications related to bowel motility ?Outcome: Progressing ?Goal: Will not experience complications related to urinary retention ?Outcome: Progressing ?  ?Problem: Pain Managment: ?Goal: General experience of comfort will improve ?Outcome: Progressing ?  ?Problem: Safety: ?Goal: Ability to remain free from injury will improve ?Outcome: Progressing ?  ?Problem: Skin Integrity: ?Goal: Risk for impaired skin integrity will decrease ?Outcome: Progressing ?  ?Problem: Education: ?Goal: Expressions of having a comfortable level of knowledge regarding the disease process will increase ?Outcome: Progressing ?  ?Problem: Coping: ?Goal: Ability to adjust to condition or change in health will improve ?Outcome: Progressing ?Goal: Ability to identify appropriate support needs will improve ?Outcome: Progressing ?  ?Problem: Health Behavior/Discharge Planning: ?Goal: Compliance with prescribed medication regimen will improve ?Outcome: Progressing ?  ?Problem: Medication: ?Goal: Risk for medication side effects will decrease ?Outcome: Progressing ?  ?Problem: Clinical  Measurements: ?Goal: Complications related to the disease process, condition or treatment will be avoided or minimized ?Outcome: Progressing ?Goal: Diagnostic test results will improve ?Outcome: Progressing ?  ?Problem: Safety: ?Goal: Verbalization of understanding the information provided will improve ?Outcome: Progressing ?  ?Problem: Self-Concept: ?Goal: Level of anxiety will decrease ?Outcome: Progressing ?Goal: Ability to verbalize feelings about condition will improve ?Outcome: Progressing ?  ?

## 2021-04-18 NOTE — Progress Notes (Signed)
?PROGRESS NOTE ? ? ? ?Steve [name]  [id number] DOB: 1965/07/28 DOA: 01/05/2021 ?PCP: No primary care provider on file.  ? ? ?Brief Narrative:  ?Steve [name] is a 56 year old male with past medical history significant for major depressive disorder, anxiety who presented to [facility] ED on 12/20 from a residential EtOH treatment facility in which she was there for the past 5 months for combativeness and altered mental status.  On arrival to the ED, patient noted to be confused, combative with labs showing acute renal failure, elevated CK level consistent with rhabdomyolysis, abnormal LFTs, WBC count elevated 23.7 and lactic acid of 7.6.  CT head negative for acute bleeding/infarct.  Blood cultures were drawn and patient was started on empiric antibiotics for concern of infection.  Patient was initially admitted to the intensive care unit and underwent lumbar puncture that was unrevealing, EEG without seizure activity.  Patient was transferred out of the ICU to hospitalist care on 01/16/2022.  Patient remains hospitalized due to unsafe discharge home alone and will need placement which has been difficult so far. ? ? ?Assessment & Plan: ?  ?Principal Problem: ?  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  Skin abnormalities/perineal MASD ?  Physical deconditioning 2/2 ambulatory dysfunction ?  Right ventricular dysfunction ?  Witnessed seizure ([place]) ?  Eczema ?  Constipation ? ? ?Cognitive impairment ?Wernicke's encephalopathy ?Alcoholic dementia ?Major depressive disorder ?--Klonopin 0.5 mg p.o. 3 times daily ?--Fluoxetine 40 mg p.o. daily ?--Risperidone 1 mg p.o. BID ?--Valproic acid 500 mg p.o. every morning, 1000 mg p.o. every afternoon ?--Olanzapine 2.5 mg qHS ?--Trazodone 100 mg p.o. nightly ?--Continue IVC due to persistent wandering behaviors and threatening to leave AMA; in which patient does not have capacity for medical decision-making. ?--Pending placement  per TOC; medically stable for discharge once safe disposition found by social work ? ?Seizure ?--Keppra 500mg  PO BID ?--Valproic acid 500 mg qAm and 1000mg  qPM ? ?Acute renal failure ?Rhabdomyolysis ?Creatinine peaked at 7.48 on 01/11/2022.  Nephrology was initially consulted with initial plans to initiate dialysis but renal function gradually improved and dialysis was not needed.  Now resolved. ? ?Physical deconditioning ?--Continue PT/OT efforts ?--TOC for placement ? ? ? ?DVT prophylaxis: rivaroxaban (XARELTO) tablet 10 mg Start: 02/27/21 1000 ?rivaroxaban (XARELTO) tablet 10 mg  ? ?  Code Status: Full Code ?Family Communication: No family present ? ?Disposition Plan:  ?Level of care: Med-Surg ?Status is: Inpatient ? ?Remains inpatient appropriate because: Pending placement ? ? ?  ? ?Consultants:  ?Psychiatry ?Nephrology ?PCCM ? ?Procedures:  ?EEG ?Echocardiogram ?Temporary dialysis catheter now discontinued ? ?Antimicrobials:  ?Unasyn x1 dose, cefepime x1 dose, ceftriaxone x1 dose, vancomycin x2 doses ? ? ?Subjective: ?Patient seen examined bedside, standing in room.  Sitter present.  States "I am leaving today by 12 noon".  "I will be going to [place] to live with my sister".   Remains difficult placement, unsafe for discharge home alone with cognitive deficits and poor decision-making skills.  Remains IVC. ? ?Objective: ?Vitals:  ? 04/17/21 0700 04/17/21 1626 04/17/21 2020 04/17/21 2320  ?BP: 105/66 99/74 118/76 96/74  ?Pulse:  81 95 86  ?Resp: 18 15 16 16   ?Temp: 98.2 ?F (36.8 ?C) 97.6 ?F (36.4 ?C) (!) 97.5 ?F (36.4 ?C) 97.6 ?F (36.4 ?C)  ?TempSrc: Oral Oral Oral   ?SpO2: 100% 98% 98% 97%  ?Weight:      ?Height:      ? ? ?Intake/Output Summary (Last 24 hours) at 04/18/2021  [id number] ?Last data filed at 04/17/2021 1300 ?Gross per 24 hour  ?Intake 320 ml  ?Output --  ?Net 320 ml  ? ?Filed Weights  ? 02/27/21 0501 03/05/21 0500 03/06/21 0500  ?Weight: 60.7 kg 57.9 kg 64.3 kg  ? ? ?Examination: ? ?General exam: Appears  calm and comfortable; but episodes of agitation and wandering trying to leave the floor ?Respiratory system: Clear to auscultation. Respiratory effort normal.  On room air ?Cardiovascular system: S1 & S2 heard, RRR. No JVD, murmurs, rubs, gallops or clicks. No pedal edema. ?Gastrointestinal system: Abdomen is nondistended, soft and nontender. No organomegaly or masses felt. Normal bowel sounds heard. ?Central nervous system: Alert and oriented. No focal neurological deficits. ?Extremities: Symmetric 5 x 5 power. ?Psychiatry: Judgement and insight appear poor. Mood & affect appropriate.  ? ? ? ?Data Reviewed: I have personally reviewed following labs and imaging studies ? ?CBC: ?Recent Labs  ?Lab 04/14/21 ?0128 04/17/21 ?[id number]  ?WBC 6.5 8.2  ?NEUTROABS 2.0  --   ?HGB 10.7* 11.5*  ?HCT 32.2* 33.3*  ?MCV 99.7 98.8  ?PLT 247 259  ? ?Basic Metabolic Panel: ?Recent Labs  ?Lab 04/14/21 ?0128 04/17/21 ?[id number]  ?NA 138 138  ?K 4.0 3.6  ?CL 106 107  ?CO2 27 25  ?GLUCOSE 91 89  ?BUN 24* 21*  ?CREATININE 0.77 0.55*  ?CALCIUM 9.1 8.8*  ?MG  --  2.3  ? ?GFR: ?Estimated Creatinine Clearance: 94.9 mL/min (A) (by C-G formula based on SCr of 0.55 mg/dL (L)). ?Liver Function Tests: ?Recent Labs  ?Lab 04/14/21 ?0128  ?AST 28  ?ALT 41  ?ALKPHOS 38  ?BILITOT 0.5  ?PROT 6.4*  ?ALBUMIN 3.5  ? ?No results for input(s): LIPASE, AMYLASE in the last 168 hours. ?No results for input(s): AMMONIA in the last 168 hours. ?Coagulation Profile: ?No results for input(s): INR, PROTIME in the last 168 hours. ?Cardiac Enzymes: ?No results for input(s): CKTOTAL, CKMB, CKMBINDEX, TROPONINI in the last 168 hours. ?BNP (last 3 results) ?No results for input(s): PROBNP in the last 8760 hours. ?HbA1C: ?No results for input(s): HGBA1C in the last 72 hours. ?CBG: ?No results for input(s): GLUCAP in the last 168 hours. ?Lipid Profile: ?No results for input(s): CHOL, HDL, LDLCALC, TRIG, CHOLHDL, LDLDIRECT in the last 72 hours. ?Thyroid Function Tests: ?No results for  input(s): TSH, T4TOTAL, FREET4, T3FREE, THYROIDAB in the last 72 hours. ?Anemia Panel: ?No results for input(s): VITAMINB12, FOLATE, FERRITIN, TIBC, IRON, RETICCTPCT in the last 72 hours. ?Sepsis Labs: ?No results for input(s): PROCALCITON, LATICACIDVEN in the last 168 hours. ? ?No results found for this or any previous visit (from the past 240 hour(s)).  ? ? ? ? ? ?Radiology Studies: ?No results found. ? ? ? ? ? ?Scheduled Meds: ? aspirin  325 mg Oral Daily  ? clonazePAM  0.5 mg Oral TID  ? colchicine  0.3 mg Oral BID  ? docusate sodium  100 mg Oral BID  ? feeding supplement  237 mL Oral TID AC & HS  ? FLUoxetine  40 mg Oral Daily  ? folic acid  1 mg Oral Daily  ? gabapentin  100 mg Oral BID  ? haloperidol lactate  5 mg Intramuscular Once  ? levETIRAcetam  500 mg Oral BID  ? mouth rinse  15 mL Mouth Rinse BID  ? melatonin  10 mg Oral QHS  ? nicotine  21 mg Transdermal Daily  ? OLANZapine  2.5 mg Oral QHS  ? polyethylene glycol  17 g Oral Daily  ?  predniSONE  20 mg Oral Q breakfast  ? risperiDONE  1 mg Oral BID  ? rivaroxaban  10 mg Oral Daily  ? thiamine  250 mg Oral Daily  ? traZODone  100 mg Oral QHS  ? valproic acid  1,000 mg Oral QHS  ? valproic acid  500 mg Oral q morning  ? ?Continuous Infusions: ? ? LOS: 103 days  ? ? ?Time spent: 46 minutes spent on chart review, discussion with nursing staff, consultants, updating family and interview/physical exam; more than 50% of that time was spent in counseling and/or coordination of care. ? ? ? ?Rodarius Kichline J [place] DO ?[facility] ?Available via Epic secure chat 7am-7pm ?After these hours, please refer to coverage provider listed on [email or web address] ?04/18/2021, 9:55 AM  ? ?

## 2021-04-19 MED ORDER — CEPHALEXIN 500 MG PO CAPS
500.0000 mg | ORAL_CAPSULE | Freq: Three times a day (TID) | ORAL | Status: AC
  Administered 2021-04-19 – 2021-04-26 (×21): 500 mg via ORAL
  Filled 2021-04-19 (×21): qty 1

## 2021-04-19 NOTE — Progress Notes (Signed)
?Progress Note ?  ?  ?PatientEulalio [name] YBW:[id number] DOB: [birth year] DOA: 01/05/2021     101 ?DOS: the patient was seen and examined on 04/16/2021 ?  ?Brief hospital course: ?Steve [name] is a 56 y.o. male with history of major depression, anxiety who was in a residential ETOH treatment center for last 5 months. He was brought to the ED on 12/20 from a residential ETOH treatment center for convalescence, combativeness and altered sensorium.  Per collateral history, patient was not acting typical for last few days. ?  ?In the ED, patient was confused, combative. Labs showed AKI with creatinine of 3.27, rhabdomyolysis, abnormal LFTs, elevated WBC count at 23.7 and lactic acid level at 7.6. 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 Blood culture was sent, empiric antibiotic started.  IV fluid resuscitation started and patient was initially admitted to ICU. ?12/21, underwent LP which showed staph in CSF, felt to be contaminant.  EEG did not show any seizures ?12/24, HD catheter was placed but patient did not require HD as creatinine started to improve ?12/31, with clinical improvement, patient was transferred out of ICU to [facility]. ?1/18 Cognitive evaluations: 2 on the SLUMS, a 9 on the MMSE, and a 0 on medi-cog and short blessed test ?  ?Assessment and Plan: ?* Cognitive impairment  2/2 Wernicke's encephalopathy and alcoholic dementia ?Initial MRI revealed tiny acute infarct on the right but not significant enough to explain persistent cognitive and behavioral abnormalities. Also evidence of prior lacunar infarct right basal ganglia ?Cognitive screening revealed significant cognitive deficits ?Appreciate psychiatric team assistance.  Medication regimen as follows: ?-Increase frequency of Risperdal to 1 mg BID QHS ?-Continue Prozac 40mg  ?-Continue Melatonin 10mg  QHS ?-Continue Trazodone 100mg  QHS   ?- DECREASE Zyprexa to 2.5 mg QHS ?- INCREASE depakote to 500 mg/1000 mg ?-Depakote lvl and CMP 4/2 ?-May continue Zyprexa 5mg  q8h PRN for agitation ?Currently under IVC due to persistent wandering behaviors and threats to leave [place]. ?  ?  ?After discharge recommendations: PLEASE NOTE THAT THIS PT'S REGIMEN OF PSYCHOTROPIC MEDS CAN NEVER BE WEANED AND DC'D DUE TO SEVERITY OF HIS [place] ?  ?  ?  ?  ?Witnessed seizure ([place]) ?No apparent prior history of seizure disorder noting patient was not on AEDs prior to admission ?Unclear if withdrawal seizure or related to other substances ?3/10 given recent behavioral changes attending felt EEG needed to be completed to rule out atypical seizure activity causing behavioral manifestations.  EEG unremarkable other than for moderate diffuse encephalopathy.  No seizure activity. ?Continue Keppra at recommendation of neurology ?  ?Acute kidney injury with rhabdomyolysis ?Resolved ?Creatinine peaked at 7.48 on 12/26.  Nephrology consulted with initial plans to initiate dialysis including placement of [facility] but renal function gradually improved and dialysis not needed and TDC removed ?  ?Eczema ?Resolved with use of topical cortisone cream but has reemerged on his chest.  Hopeful that current prednisone will help.  After prednisone completed will likely need to resume topical cortisone cream at a low dose ? ?Possible abscess anterior neck ?4/3 noted on exam ?Nontender but red and blanchable ?Continue to follow ?Begin empiric antibiotics/Keflex ? ? ?Possible Morton's neuroma ?Complaining of numbness and tingling with weightbearing on left great toe as well as the adjacent 2 toes ?Follow ?Patient encouraged to wear arch supportive footwear-shoes located in his closet in room ? ?Constipation ?Resolved ? ?  Possible DVT (deep venous thrombosis)  ?Per critical care note, hemodialysis cath had a clot on it when it was removed.  UE duplex  12/31: no DVT.   ?   ?Right ventricular dysfunction ?Echo 1/2 with EF 60 to 65%.  Cannot rule out a small PFO this finding determined to be clinically insignificant given the patient was asymptomatic ?  ?Elevated transaminases at time of presentation ?Likely secondary to sepsis like physiology at presentation ?His AST and ALT were significantly elevated to over 700s on admission.  ?Re emergence mild elevation LFTs-likely due to Zyprexa and Depakote ?Due to oversedation Zyprexa dose decreased ?LFTs have normalized with decrease in Zyprexa dose. ?2/16 valproic acid level 55 ?  ?  ?Physical deconditioning 2/2 ambulatory dysfunction ?Patient now ambulating without difficulty ?A physical therapy consult is indicated based on the patient?s mobility assessment. ?  ?  ?Mobility Assessment (last 72 hours)   ?  ?  Mobility Assessment   ?  ?  [place] Name 03/15/21 1700 03/15/21 1200 03/14/21 1200 03/14/21 0952 03/14/21 0830  ?  Does patient have an order for bedrest or is patient medically unstable -- No - Continue assessment -- No - Continue assessment No - Continue assessment  ?  What is the highest level of mobility based on the progressive mobility assessment? Level 4 (Walks with assist in room) - Balance while marching in place and cannot step forward and back - Complete Level 4 (Walks with assist in room) - Balance while marching in place and cannot step forward and back - Complete -- Level 4 (Walks with assist in room) - Balance while marching in place and cannot step forward and back - Complete Level 4 (Walks with assist in room) - Balance while marching in place and cannot step forward and back - Complete  ?  Is the above level different from baseline mobility prior to current illness? -- Yes - Recommend PT order -- Yes - Recommend PT order Yes - Recommend PT order  ?  ?  [place] Name 03/13/21 2034 03/13/21 0800   ?    ?    ?   ?  Does patient have an order for bedrest or is patient medically unstable No - Continue assessment No - Continue  assessment        ?  What is the highest level of mobility based on the progressive mobility assessment? Level 4 (Walks with assist in room) - Balance while marching in place and cannot step forward and back - Complete Level 2 ([place]) - Balance while sitting on edge of bed and cannot stand        ?  Is the above level different from baseline mobility prior to current illness? Yes - Recommend PT order Yes - Recommend PT order        ?  ?   ?  ?  ?   ?  ?  ?  ?  ?Severe sepsis without septic shock  ?Ruled out ?Sepsis-like physiology secondary to presentation with profound dehydration, hypoperfusion from dehydration related hypotension seizure activity ?Chest work-up was negative ?  ?Skin abnormalities/perineal MASD ?Resolved ?  ?Protein calorie malnutrition ([place]) ?Patient able to feed self independently and is eating well ?  ?Left ankle pain ?Again x-ray unremarkable ?Ankle and foot somewhat reddened and swollen and quite tender.  Pulse palpable. ?Suspect may be experiencing acute gout flare.  [place] and ESR are normal.  CRP only mildly elevated ?Has completed a 5-day course of prednisone.  On 4/3 will discontinue colcichine ? ?  ?  ?  ?Subjective:  ?New complaints today: Complaining of numbness in the toes of the foot as well is a raised area on the back of his neck that is nontender ? ?Physical Exam: ?      ?Vitals:  ?  04/15/21 2336 04/16/21 0500 04/16/21 0751 04/16/21 0800  ?BP: 100/64 105/66 97/67    ?Pulse: 83 87 70    ?Resp: 16 18 19     ?Temp: 98.2 ?F (36.8 ?C) 97.9 ?F (36.6 ?C) (!) 97.4 ?F (36.3 ?C) 97.8 ?F (36.6 ?C)  ?TempSrc: Oral Oral Oral    ?SpO2: 98% 97% 100%    ?Weight:          ?Height:          ?  ?Constitutional: No acute distress, calm, no complaints that he wanted to leave AMA. ?Respiratory: Stable on room air, anterior lung sounds clear to auscultation.  Normal respiratory effort ?Cardiovascular: Normotensive, S1-S2, no peripheral edema.  Regular pulse. ?Abdomen: no tenderness, no masses  palpated. Bowel sounds positive. LBM 3/28 ?Neurologic: CN 2-12 grossly intact. Sensation intact, Strength 5/5 x all 4 extremities.  Able to ambulate independently without gait disturbance ?Psychiatric: Awake, sit

## 2021-04-19 NOTE — TOC Progression Note (Addendum)
Transition of Care (TOC) - Progression Note  ? ? ?Patient Details  ?Name: Steve [name] ?MRN: [id number] ?Date of Birth: 1965/06/21 ? ?Transition of Care (TOC) CM/SW Contact  ?Curlene Labrum, RN ?Phone Number: ?04/19/2021, 10:45 AM ? ?Clinical Narrative:    ?CM met with the patient at the bedside with safety sitter present.  The patient is currently under IVC for safety reasons - patient does not have capacity to make medical decisions and is waiting for LTC placement - likely [facility] and pending court date for guardianship through DSS.  The patient's son is available to sign the patient in for admission if a bed is offered for placement. ? ?New clinicals sent to [facility] to review for admission.  Message sent to Anselm Pancoast, Admissions at [facility]. ? ? ?Expected Discharge Plan: [place] ?Barriers to Discharge: Continued Medical Work up, Homeless with medical needs, Inadequate or no insurance ? ?Expected Discharge Plan and Services ?Expected Discharge Plan: [place] ?In-house Referral: PCP / Health Connect ?Discharge Planning Services: CM Consult, [place] [facility], Medication Assistance, Follow-up appt scheduled, [facility] ?  ?Living arrangements for the past 2 months: [place] (Patient was resident at [facility] prior to hospital admission.) ?Expected Discharge Date: 01/24/21               ?  ?  ?  ?  ?  ?  ?  ?  ?  ?  ? ? ?Social Determinants of Health (SDOH) Interventions ?  ? ?Readmission Risk Interventions ?   ? View : No data to display.  ?  ?  ?  ? ? ?

## 2021-04-20 MED ORDER — VALPROIC ACID 250 MG PO CAPS
750.0000 mg | ORAL_CAPSULE | Freq: Every morning | ORAL | Status: DC
Start: 2021-04-21 — End: 2021-05-14
  Administered 2021-04-21 – 2021-05-14 (×24): 750 mg via ORAL
  Filled 2021-04-20 (×24): qty 3

## 2021-04-20 MED ORDER — OXYCODONE HCL 5 MG PO TABS
7.5000 mg | ORAL_TABLET | ORAL | Status: DC | PRN
  Administered 2021-04-21 – 2021-05-11 (×7): 7.5 mg via ORAL
  Filled 2021-04-20 (×7): qty 2

## 2021-04-20 MED ORDER — LIDOCAINE 5 % EX PTCH
1.0000 | MEDICATED_PATCH | CUTANEOUS | Status: DC
  Administered 2021-04-20 – 2021-05-14 (×21): 1 via TRANSDERMAL
  Filled 2021-04-20 (×21): qty 1

## 2021-04-20 MED ORDER — IBUPROFEN 200 MG PO TABS
800.0000 mg | ORAL_TABLET | Freq: Four times a day (QID) | ORAL | Status: AC
  Administered 2021-04-20 – 2021-04-27 (×22): 800 mg via ORAL
  Filled 2021-04-20 (×24): qty 4

## 2021-04-20 MED ORDER — GABAPENTIN 100 MG PO CAPS
200.0000 mg | ORAL_CAPSULE | Freq: Two times a day (BID) | ORAL | Status: DC
  Administered 2021-04-20 – 2021-05-08 (×37): 200 mg via ORAL
  Filled 2021-04-20 (×37): qty 2

## 2021-04-20 NOTE — Consult Note (Signed)
[facility] Face-to-Face Psychiatry Consult  ? ?Reason for Consult:Severe night time agitation ?Referring Physician:  Beverly [name] ?Patient Identification: Steve [name] ?MRN:  [id number] ?Principal Diagnosis: Wernicke's encephalopathy ?Diagnosis:  Principal Problem: ?  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  Skin abnormalities/perineal MASD ?  Physical deconditioning 2/2 ambulatory dysfunction ?  Right ventricular dysfunction ?  Witnessed seizure ([place]) ?  Eczema ?  Constipation ? ? ?HPI:   Steve [name] is a 56 y.o. male with history of major depression, anxiety who was in a residential ETOH treatment center for last 5 months. He was brought to the ED on 12/20 from a residential ETOH treatment center for convalescence, combativeness and altered sensorium.  Per collateral history, patient was not acting typical for last few days. ? ?Subjective: " I am feeling much better at this time.  Looking for a place to go.  They found a place in [place], however I will have a roommate and I am not sure about paying that amount of money every month with a roommate." ? ?Patient is seen today.  Patient is awake and alert x3, sitting in a chair.  He states the date as April 20, 2021.  He reports that he is sleeping well, and denies any side effects from his current medication.  He states the medications seem to be working well, as he feels better and overall improvement in his mood.  Patient presentation today was without any confabulations with the exception of him admitting to using acid prior to his hospitalization.  He appears to be compliance with his psychotropic medication, and denies any physical complaints at this time.  He does agree with adjusting medication, as deemed appropriate by psychiatric team.  Patient denies any acute psychiatric and or psychotic symptoms to include auditory and/or visual hallucinations.  His exam is without responding to internal stimuli,  external stimuli, and or displaying delusional thought disorder.  Patient denies auditory and visual hallucinations.   Patient is specifically denying any suicidal or homicidal ideation.  He asks about discharge planning and if there has been a place found for him yet. ? ?Per chart review patient has been without any agitated behaviors, disruptive, agitation, and or aggression.  He continues to have a Air cabin crew at bedside.  He has been able to complete his ADLs independently, and seemingly was discharged from occupational therapy today as he has met all of his goals.  Patient appears to be back at psychiatric baseline, and will psychiatrically clear at this time. ? ? ?Past Psychiatric History: MDD, anxiety.  ?Vistaril 50 mg every 4 hours needed for anxiety, Prozac 40 mg daily and trazodone 50 mg at bedtime.  ?Risk to Self:   ?Risk to Others:   ?Prior Inpatient Therapy:   ?Prior Outpatient Therapy:   ? ?Past Medical History: History reviewed. No pertinent past medical history. History reviewed. No pertinent surgical history. ?Family History: History reviewed. No pertinent family history. ? ?Family Psychiatric  History: None noted ? ?Social History:  ?Social History  ? ?Substance and Sexual Activity  ?Alcohol Use None  ?   ?Social History  ? ?Substance and Sexual Activity  ?Drug Use Not on file  ?  ?Social History  ? ?Socioeconomic History  ? Marital status: Unknown  ?  Spouse name: Not on file  ? Number of children: Not on file  ? Years of education: Not on file  ? Highest education level: Not on file  ?Occupational History  ? Not  on file  ?Tobacco Use  ? Smoking status: Every Day  ?  Packs/day: 0.50  ?  Types: Cigarettes  ? Smokeless tobacco: Current  ?Substance and Sexual Activity  ? Alcohol use: Not on file  ? Drug use: Not on file  ? Sexual activity: Not on file  ?Other Topics Concern  ? Not on file  ?Social History Narrative  ? Not on file  ? ?Social Determinants of Health  ? ?Financial Resource Strain: Not  on file  ?Food Insecurity: Not on file  ?Transportation Needs: Not on file  ?Physical Activity: Not on file  ?Stress: Not on file  ?Social Connections: Not on file  ? ?Additional Social History: ?  ?Essentially homeless ? ? ?Allergies:  No Known Allergies ? ?Labs:  ?Results for orders placed or performed during the hospital encounter of 01/05/21 (from the past 48 hour(s))  ?Valproic acid level     Status: Abnormal  ? Collection Time: 04/18/21  8:28 PM  ?Result Value Ref Range  ? Valproic Acid Lvl 36 (L) 50.0 - 100.0 ug/mL  ?  Comment: Performed at [facility], [place] [address], [place], [place] [address]  ?Comprehensive metabolic panel     Status: Abnormal  ? Collection Time: 04/18/21  8:28 PM  ?Result Value Ref Range  ? Sodium 136 135 - 145 mmol/L  ? Potassium 4.3 3.5 - 5.1 mmol/L  ? Chloride 104 98 - 111 mmol/L  ? CO2 25 22 - 32 mmol/L  ? Glucose, Bld 93 70 - 99 mg/dL  ?  Comment: Glucose reference range applies only to samples taken after fasting for at least 8 hours.  ? BUN 26 (H) 6 - 20 mg/dL  ? Creatinine, Ser 0.84 0.61 - 1.24 mg/dL  ? Calcium 9.3 8.9 - 10.3 mg/dL  ? Total Protein 7.3 6.5 - 8.1 g/dL  ? Albumin 3.9 3.5 - 5.0 g/dL  ? AST 33 15 - 41 U/L  ? ALT 54 (H) 0 - 44 U/L  ? Alkaline Phosphatase 34 (L) 38 - 126 U/L  ? Total Bilirubin 0.5 0.3 - 1.2 mg/dL  ? GFR, Estimated >60 >60 mL/min  ?  Comment: (NOTE) ?Calculated using the CKD-EPI Creatinine Equation (2021) ?  ? Anion gap 7 5 - 15  ?  Comment: Performed at [facility], [place] [address], [place], [place] [address]  ? ? ?Current Facility-Administered Medications  ?Medication Dose Route Frequency Provider Last Rate Last Admin  ? acetaminophen (TYLENOL) tablet 650 mg  650 mg Oral Q4H PRN Vernelle Emerald, MD   650 mg at 04/18/21 0908  ? aspirin tablet 325 mg  325 mg Oral Daily Greta Doom, MD   325 mg at 04/20/21 1105  ? cephALEXin (KEFLEX) capsule 500 mg  500 mg Oral Q8H Samella Parr, NP   500 mg at 04/20/21 1349  ? clonazePAM  (KLONOPIN) tablet 0.5 mg  0.5 mg Oral TID Samella Parr, NP   0.5 mg at 04/20/21 1105  ? diphenhydrAMINE (BENADRYL) capsule 25 mg  25 mg Oral Q6H PRN [place] Eric J, DO   25 mg at 04/18/21 2109  ? docusate sodium (COLACE) capsule 100 mg  100 mg Oral BID Spero Geralds, MD   100 mg at 04/20/21 1105  ? feeding supplement (ENSURE ENLIVE / ENSURE PLUS) liquid 237 mL  237 mL Oral TID AC & HS Samella Parr, NP   237 mL at 04/20/21 1110  ? FLUoxetine (PROZAC)  capsule 40 mg  40 mg Oral Daily Dahal, Marlowe Aschoff, MD   40 mg at 04/20/21 1105  ? folic acid (FOLVITE) tablet 1 mg  1 mg Oral Daily Vann, Jessica U, DO   1 mg at 04/20/21 1105  ? gabapentin (NEURONTIN) capsule 200 mg  200 mg Oral BID Samella Parr, NP   200 mg at 04/20/21 1105  ? ibuprofen (ADVIL) tablet 800 mg  800 mg Oral Q6H Samella Parr, NP   800 mg at 04/20/21 1114  ? levETIRAcetam (KEPPRA) tablet 500 mg  500 mg Oral BID Hammons, Kimberly B, RPH   500 mg at 04/20/21 1105  ? lidocaine (LIDODERM) 5 % 1 patch  1 patch Transdermal Q24H Samella Parr, NP   1 patch at 04/20/21 1111  ? MEDLINE mouth rinse  15 mL Mouth Rinse BID Olalere, Adewale A, MD   15 mL at 04/19/21 2135  ? melatonin tablet 10 mg  10 mg Oral QHS Jonetta Osgood, MD   10 mg at 04/19/21 2131  ? nicotine (NICODERM CQ - dosed in mg/24 hours) patch 21 mg  21 mg Transdermal Daily Dahal, Marlowe Aschoff, MD   21 mg at 04/19/21 0946  ? OLANZapine (ZYPREXA) injection 5 mg  5 mg Intramuscular Q6H PRN Barb Merino, MD   5 mg at 04/06/21 0424  ? OLANZapine (ZYPREXA) tablet 2.5 mg  2.5 mg Oral QHS Cinderella, Margaret A   2.5 mg at 04/19/21 2132  ? OLANZapine (ZYPREXA) tablet 5 mg  5 mg Oral Q6H PRN Barb Merino, MD   5 mg at 04/18/21 0908  ? ondansetron (ZOFRAN) injection 4 mg  4 mg Intravenous Q8H PRN Margaretmary Lombard, MD   4 mg at 01/15/21 0208  ? oxyCODONE (Oxy IR/ROXICODONE) immediate release tablet 7.5 mg  7.5 mg Oral Q4H PRN Samella Parr, NP      ? polyethylene glycol (MIRALAX / GLYCOLAX)  packet 17 g  17 g Oral Daily Spero Geralds, MD   17 g at 04/05/21 1053  ? polyvinyl alcohol (LIQUIFILM TEARS) 1.4 % ophthalmic solution 1 drop  1 drop Both Eyes PRN Shela Leff, MD   1 drop at 03/21/2

## 2021-04-20 NOTE — Progress Notes (Addendum)
Occupational Therapy Treatment and DISCHARGE from OT ?Patient Details ?Name: Steve [name] ?MRN: [id number] ?DOB: 08/25/1965 ?Today's Date: 04/20/2021 ? ? ?History of present illness Pt is a 56 y.o. male admitted from group home for convalescence, combativeness and altered sensorium on 01/05/21 with witnessed seizure, AMS. 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 EEG unremarkable. LP on 12/21 showed staph in CSF, felt to be contaminant. Workup for AKI, rhabdomyolysis. Course complicated by c/o L foot pain; imaging negative for acute injury. Pt remains hospitalized due to difficult to place. Pt with psych consult who felt no need for inpt psych. No PMH in chart. ?  ?OT comments ? Pt progressing towards OT goals. Pt fully dressed when OT entered the room, reports having done it himself. Pt drew clock for OT (please see cognitive section comments) and continues to demonstrate deficits in cognition, awareness, safety. Pt was able to don shoes for hallway mobility. Stop at sink for brief grooming tasks at supervision level. Pt has achieved all OT goals. OT WILL SIGN OFF AT THIS TIME. Please feel free to re-consult if needed in the future.   ? ?Recommendations for follow up therapy are one component of a multi-disciplinary discharge planning process, led by the attending physician.  Recommendations may be updated based on patient status, additional functional criteria and insurance authorization. ?   ?Follow Up Recommendations ?    ?  ?Assistance Recommended at Discharge    ?Patient can return home with the following ?   ?  ?Equipment Recommendations ? None recommended by OT  ?  ?Recommendations for Other Services   ? ?  ?Precautions / Restrictions Precautions ?Precautions: Fall ?Precaution Comments: has a sitter ?Restrictions ?Weight Bearing Restrictions: No  ? ? ?  ? ?Mobility Bed Mobility ?Overal bed  mobility: Independent ?  ?  ?  ?  ?  ?  ?  ?  ? ?Transfers ?Overall transfer level: Modified independent ?  ?  ?  ?  ?  ?  ?  ?  ?  ?  ?  ?Balance Overall balance assessment: Mild deficits observed, not formally tested ?  ?  ?  ?  ?  ?  ?  ?  ?  ?  ?  ?  ?  ?  ?  ?  ?  ?  ?   ? ?ADL either performed or assessed with clinical judgement  ? ?ADL Overall ADL's : Needs assistance/impaired ?Eating/Feeding: Independent;Sitting ?  ?  ?  ?  ?  ?  ?  ?  ?  ?  ?  ?Toilet Transfer: Supervision/safety;Ambulation ?Toilet Transfer Details (indicate cue type and reason): no difficulties this session ?  ?  ?  ?  ?Functional mobility during ADLs: Supervision/safety ?  ?  ? ?Extremity/Trunk Assessment   ?  ?  ?  ?  ?  ? ?Vision   ?  ?  ?Perception   ?  ?Praxis   ?  ? ?Cognition Arousal/Alertness: Awake/alert ?Behavior During Therapy: [facility] for tasks assessed/performed, Impulsive ?Overall Cognitive Status: Impaired/Different from baseline ?Area of Impairment: Attention, Awareness, Problem solving, Safety/judgement ?  ?  ?  ?  ?  ?  ?  ?  ?  ?Current Attention Level: Selective ?  ?  ?Safety/Judgement: Decreased awareness of safety, Decreased awareness of deficits ?Awareness: Anticipatory ?Problem Solving: Requires verbal cues ?General Comments: Pt overall pleasant and cooperative,  oriented to hospital. Today OT asked him to draw a clock. Initially he attempted to trace an expo marker. When asked "how will you fit the numbers in there" he fixated on getting a nice circle. He was able to use the bottom of a cup to trace a circle, and proceeded to draw lines through it to divide it into 4 equal pieces. When asked "what about the clock numbers" he divided the clock with an additional line starting in the center and going to the edge between the quarters (turning it into 8ths). He then labeled the clock appropriately starting at the 9 and working his way around until the 8, which he labeled incorrectly as 7. He was able to correct with a  verbal prompt. ?  ?  ?   ?Exercises   ? ?  ?Shoulder Instructions   ? ? ?  ?General Comments    ? ? ?Pertinent Vitals/ Pain       Pain Assessment ?Pain Assessment: No/denies pain ?Pain Intervention(s): Monitored during session ? ?Home Living   ?  ?  ?  ?  ?  ?  ?  ?  ?  ?  ?  ?  ?  ?  ?  ?  ?  ?  ? ?  ?Prior Functioning/Environment    ?  ?  ?  ?   ? ?Frequency ?    ? ? ? ? ?  ?Progress Toward Goals ? ?OT Goals(current goals can now be found in the care plan section) ? Progress towards OT goals: Goals met/education completed, patient discharged from OT ? ?Acute Rehab OT Goals ?Patient Stated Goal: get out of hospital ?OT Goal Formulation: With patient ?Time For Goal Achievement: 04/20/21 ?Potential to Achieve Goals: Good  ?Plan All goals met and education completed, patient discharged from OT services   ? ?Co-evaluation ? ? ?   ?  ?  ?  ?  ? ?  ?AM-PAC OT "6 Clicks" Daily Activity     ?Outcome Measure ? ? Help from another person eating meals?: None ?Help from another person taking care of personal grooming?: A Little ?Help from another person toileting, which includes using toliet, bedpan, or urinal?: A Little ?Help from another person bathing (including washing, rinsing, drying)?: A Little ?Help from another person to put on and taking off regular upper body clothing?: A Little ?Help from another person to put on and taking off regular lower body clothing?: A Little ?6 Click Score: 19 ? ?  ?End of Session   ? ?  ?  ?Activity Tolerance Patient tolerated treatment well ?  ?Patient Left Other (comment) (walking in hallway with sitter) ?  ?Nurse Communication Mobility status ?  ? ?   ? ?Time: 1202-1222 ?OT Time Calculation (min): 20 min ? ?Charges: OT General Charges ?$OT Visit: 1 Visit ?OT Treatments ?$Therapeutic Activity: 8-22 mins ? ?Jesse Sans OTR/L ?Acute Rehabilitation Services ?Pager: [phone] ?Office: [phone] ? ?Merri Ray Tyeson T[place] ?04/20/2021, 1:47 PM ?

## 2021-04-20 NOTE — Progress Notes (Signed)
?Progress Note ?  ?  ?PatientMaddoxx [name] NTI:[id number] DOB: [birth year] DOA: 01/05/2021     101 ?DOS: the patient was seen and examined on 04/16/2021 ?  ?Brief hospital course: ?Steve [name] is a 56 y.o. male with history of major depression, anxiety who was in a residential ETOH treatment center for last 5 months. He was brought to the ED on 12/20 from a residential ETOH treatment center for convalescence, combativeness and altered sensorium.  Per collateral history, patient was not acting typical for last few days. ?  ?In the ED, patient was confused, combative. Labs showed AKI with creatinine of 3.27, rhabdomyolysis, abnormal LFTs, elevated WBC count at 23.7 and lactic acid level at 7.6. 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 Blood culture was sent, empiric antibiotic started.  IV fluid resuscitation started and patient was initially admitted to ICU. ?12/21, underwent LP which showed staph in CSF, felt to be contaminant.  EEG did not show any seizures ?12/24, HD catheter was placed but patient did not require HD as creatinine started to improve ?12/31, with clinical improvement, patient was transferred out of ICU to [facility]. ?1/18 Cognitive evaluations: 2 on the SLUMS, a 9 on the MMSE, and a 0 on medi-cog and short blessed test ?  ?Assessment and Plan: ?* Cognitive impairment  2/2 Wernicke's encephalopathy and alcoholic dementia ?Initial MRI revealed tiny acute infarct on the right but not significant enough to explain persistent cognitive and behavioral abnormalities. Also evidence of prior lacunar infarct right basal ganglia ?Cognitive screening revealed significant cognitive deficits ?Appreciate psychiatric team assistance.  Medication regimen as follows: ?-Increase frequency of Risperdal to 1 mg BID QHS ?-Continue Prozac 72m ?-Continue Melatonin 158mQHS ?-Continue Trazodone 1001mHS   ?- DECREASE Zyprexa to 2.5 mg QHS ?- INCREASE depakote to 500 mg/1000 mg ?-Depakote lvl and CMP 4/2 ?-May continue Zyprexa 5mg31mh PRN for agitation ?Currently under IVC due to persistent wandering behaviors and threats to leave [place]? ?**Please note that sitter and IVC are being utilized in the acute hospital setting noting in the hospital setting we are unable to provide the same physical barriers as a locked unit and therefore until patient is discharged he will need to require a sitter and a locked unit due to his recurrent wandering behaviors which increase at night. ?  ?  ?After discharge recommendations: PLEASE NOTE THAT THIS PT'S REGIMEN OF PSYCHOTROPIC MEDS CAN NEVER BE WEANED AND DC'D DUE TO SEVERITY OF HIS [place]?  ?  ?  ?Witnessed seizure ([name])[place] apparent prior history of seizure disorder noting patient was not on AEDs prior to admission ?Unclear if withdrawal seizure or related to other substances ?3/10 given recent behavioral changes attending felt EEG needed to be completed to rule out atypical seizure activity causing behavioral manifestations.  EEG unremarkable other than for moderate diffuse encephalopathy.  No seizure activity. ?Continue Keppra at recommendation of neurology ?  ?Acute kidney injury with rhabdomyolysis ?Resolved ?Creatinine peaked at 7.48 on 12/26.  Nephrology consulted with initial plans to initiate dialysis including placement of TDC [facility] renal function gradually improved and dialysis not needed and TDC removed ?  ?Eczema ?Resolved with use of topical cortisone cream but has reemerged on his chest.  Hopeful that current prednisone will help.  After prednisone completed will likely need to resume topical cortisone cream at a low  dose ? ?Possible abscess anterior neck ?4/3 noted on exam ?Nontender but red and blanchable ?Continue to follow ?Begin empiric antibiotics/Keflex-appears less swollen and red today on 4/4 ? ? ?Possible Morton's  neuroma ?Complaining of numbness and tingling with weightbearing on left great toe as well as the adjacent 2 toes ?Follow ?Patient encouraged to wear arch supportive footwear-shoes located in his closet in room ?Recent low dose Prednisone did not help-consider sch NSAID; 4/4 patient continues to complain of ankle and dorsal foot pain.  Notes some swelling and distention of veins.  Patient encouraged to elevate feet when standing and utilize appropriate footwear when ambulating.  We will go ahead and adjust pain meds as follows: ?Increase Neurontin to 200 mg twice daily, increase oxy to 7.5 mg every 4 hours as needed, and begin scheduled ibuprofen 800 mg every 6 hours for 7 days and follow BMET prn ? ?Constipation ?Resolved ? ?Possible DVT (deep venous thrombosis)  ?Per critical care note, hemodialysis cath had a clot on it when it was removed.  UE duplex  12/31: no DVT.   ?  ?Right ventricular dysfunction ?Echo 1/2 with EF 60 to 65%.  Could not rule out a small PFO but this finding determined to be clinically insignificant given the patient was asymptomatic ?  ?Elevated transaminases at time of presentation ?Likely secondary to sepsis like physiology at presentation ?His AST and ALT were significantly elevated to over 700s on admission.  ?Re emergence mild elevation LFTs-likely due to Zyprexa and Depakote ?Due to oversedation Zyprexa dose decreased ?LFTs have normalized with decrease in Zyprexa dose. ?2/16 valproic acid level 55 ?  ?  ?Physical deconditioning 2/2 ambulatory dysfunction ?Patient now ambulating without difficulty ?A physical therapy consult is indicated based on the patient?s mobility assessment. ?  ?  ?Mobility Assessment (last 72 hours)   ?  ?  Mobility Assessment   ?  ?  [place] Name 03/15/21 1700 03/15/21 1200 03/14/21 1200 03/14/21 0952 03/14/21 0830  ?  Does patient have an order for bedrest or is patient medically unstable -- No - Continue assessment -- No - Continue assessment No - Continue  assessment  ?  What is the highest level of mobility based on the progressive mobility assessment? Level 4 (Walks with assist in room) - Balance while marching in place and cannot step forward and back - Complete Level 4 (Walks with assist in room) - Balance while marching in place and cannot step forward and back - Complete -- Level 4 (Walks with assist in room) - Balance while marching in place and cannot step forward and back - Complete Level 4 (Walks with assist in room) - Balance while marching in place and cannot step forward and back - Complete  ?  Is the above level different from baseline mobility prior to current illness? -- Yes - Recommend PT order -- Yes - Recommend PT order Yes - Recommend PT order  ?  ?  [place] Name 03/13/21 2034 03/13/21 0800   ?    ?    ?   ?  Does patient have an order for bedrest or is patient medically unstable No - Continue assessment No - Continue assessment        ?  What is the highest level of mobility based on the progressive mobility assessment? Level 4 (Walks with assist in room) - Balance while marching in place and cannot step forward and back - Complete Level 2 (Chairfast) - Balance while sitting on edge of  bed and cannot stand        ?  Is the above level different from baseline mobility prior to current illness? Yes - Recommend PT order Yes - Recommend PT order        ?  ?   ?  ?  ?   ?  ?  ?  ?  ?Severe sepsis without septic shock  ?Ruled out ?Sepsis-like physiology secondary to presentation with profound dehydration, hypoperfusion from dehydration related hypotension seizure activity ?Chest work-up was negative ?  ?Skin abnormalities/perineal MASD ?Resolved ?  ?Protein calorie malnutrition ([place]) ?Patient able to feed self independently and is eating well ?  ?Left ankle pain ?Again x-ray unremarkable ?Ankle and foot somewhat reddened and swollen and quite tender.  Pulse palpable. ?Suspect may be experiencing acute gout flare.  Gassett and ESR are normal.  CRP only  mildly elevated ?Has completed a 5-day course of prednisone.  On 4/3 will discontinue colcichine ? ?  ?  ?  ?Subjective:  ? ?Physical Exam: ?      ?Vitals:  ?  04/15/21 2336 04/16/21 0500 04/16/21 0751 04/16/21 0800

## 2021-04-20 NOTE — Progress Notes (Addendum)
2:30pm: ?CSW spoke with [place] who states that after further discussion with [facility] administration, patient still will not be offered a bed. ? ?7:45am: ?CSW spoke with Adella Nissen at [facility] who states the facility cannot accept the patient in his current state due to him having a sitter and under IVC. Kristal willing to visit with patient after both are discontinued and patient able to cooperate without need for sitter or IVC. ? ?Edwin Dada, MSW, LCSW ?Transitions of Care  Clinical Social Worker II ?[phone] ? ?

## 2021-04-21 MED ORDER — HYDROCORTISONE 1 % EX CREA
TOPICAL_CREAM | Freq: Three times a day (TID) | CUTANEOUS | Status: DC
  Administered 2021-04-23 – 2021-05-10 (×12): 1 via TOPICAL
  Filled 2021-04-21 (×5): qty 28

## 2021-04-21 NOTE — Progress Notes (Signed)
CSW spoke with Ebony Hail at [facility] who states she is agreeable to review referral. ? ?Madilyn Fireman, MSW, LCSW ?Transitions of Care  Clinical Social Worker II ?[phone] ? ?

## 2021-04-21 NOTE — Progress Notes (Addendum)
?Progress Note ?  ?  ?PatientJustus [name] EPP:[id number] DOB: 08-15-1965 DOA: 01/05/2021     101 ?DOS: the patient was seen and examined on 04/16/2021 ?  ?Brief hospital course: ?Steve [name] is a 56 y.o. male with history of major depression, anxiety who was in a residential ETOH treatment center for last 5 months. He was brought to the ED on 12/20 from a residential ETOH treatment center for convalescence, combativeness and altered sensorium.  Per collateral history, patient was not acting typical for last few days. ?  ?In the ED, patient was confused, combative. Labs showed AKI with creatinine of 3.27, rhabdomyolysis, abnormal LFTs, elevated WBC count at 23.7 and lactic acid level at 7.6. 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 Blood culture was sent, empiric antibiotic started.  IV fluid resuscitation started and patient was initially admitted to ICU. ?12/21, underwent LP which showed staph in CSF, felt to be contaminant.  EEG did not show any seizures ?12/24, HD catheter was placed but patient did not require HD as creatinine started to improve ?12/31, with clinical improvement, patient was transferred out of ICU to [facility]. ?1/18 Cognitive evaluations: 2 on the SLUMS, a 9 on the MMSE, and a 0 on medi-cog and short blessed test ?  ?Assessment and Plan: ?* Cognitive impairment  2/2 Wernicke's encephalopathy and alcoholic dementia ?Initial MRI revealed tiny acute infarct on the right but not significant enough to explain persistent cognitive and behavioral abnormalities. Also evidence of prior lacunar infarct right basal ganglia ?Cognitive screening revealed significant cognitive deficits; 4/5 repeat SLUMS has impoved to 24/30 but still c/w congnitive impsirments  ?Appreciate psychiatric team assistance.  Medication regimen as follows: ?-Increase frequency of Risperdal to 1 mg BID  QHS ?-Continue Prozac 53m ?-Continue Melatonin 170mQHS ?-Continue Trazodone 10056mHS  ?- DECREASE Zyprexa to 2.5 mg QHS ?- INCREASE depakote to 500 mg/1000 mg ?-Depakote lvl and CMP 4/2 ?-May continue Zyprexa 5mg59mh PRN for agitation ?Currently under IVC due to persistent wandering behaviors and threats to leave [place]? ?**Please note that sitter and IVC are being utilized in the acute hospital setting noting in the hospital setting we are unable to provide the same physical barriers as a locked unit and therefore for the patient's safety until patient is discharged he will need to require a sitter and a locked unit due to his recurrent wandering behaviors which increase at night. ?  ?  ?After discharge recommendations: PLEASE NOTE THAT THIS PT'S REGIMEN OF PSYCHOTROPIC MEDS CAN NEVER BE WEANED AND DC'D DUE TO SEVERITY OF HIS [place]?  ?  ?  ?Witnessed seizure ([name])[place] apparent prior history of seizure disorder noting patient was not on AEDs prior to admission ?Unclear if withdrawal seizure or related to other substances ?3/10 given recent behavioral changes attending felt EEG needed to be completed to rule out atypical seizure activity causing behavioral manifestations.  EEG unremarkable other than for moderate diffuse encephalopathy.  No seizure activity. ?Continue Keppra at recommendation of neurology ?  ?Acute kidney injury with rhabdomyolysis ?Resolved ?Creatinine peaked at 7.48 on 12/26.  Nephrology consulted with initial plans to initiate dialysis including placement of TDC [facility] renal function gradually improved and dialysis not needed and TDC removed ?  ?Eczema ?Lesions initially resolved after short course of topical cortisone but have reemerged.  On 4/5 we will resume  topical cortisone to all areas on extremities abdomen and back. ? ?Possible abscess anterior neck ?4/3 noted on exam--Nontender but red and blanchable ?Continue to follow ?4/3 initiated empiric  antibiotics/Keflex ? ? ?Possible Morton's neuroma ?Complaining of numbness and tingling with weightbearing on left great toe as well as the adjacent 2 toes ?Follow ?Patient encouraged to wear arch supportive footwear-shoes located in his closet in room ?Recent low dose Prednisone did not help-consider sch NSAID; 4/4 patient continues to complain of ankle and dorsal foot pain.  Notes some swelling and distention of veins.  Patient encouraged to elevate feet when standing and utilize appropriate footwear when ambulating.  We will go ahead and adjust pain meds as follows: ?Increase Neurontin to 200 mg twice daily, increase oxy to 7.5 mg every 4 hours as needed, and begin scheduled ibuprofen 800 mg every 6 hours for 7 days and follow BMET prn ? ?Constipation ?Resolved ? ?Possible DVT (deep venous thrombosis)  ?Per critical care note, hemodialysis cath had a clot on it when it was removed.  UE duplex  12/31: no DVT.   ?  ?Right ventricular dysfunction ?Echo 1/2 with EF 60 to 65%.  Could not rule out a small PFO but this finding determined to be clinically insignificant given the patient was asymptomatic ?  ?Elevated transaminases at time of presentation ?Likely secondary to sepsis like physiology at presentation ?His AST and ALT were significantly elevated to over 700s on admission.  ?Re emergence mild elevation LFTs-likely due to Zyprexa and Depakote ?Due to oversedation Zyprexa dose decreased ?LFTs have normalized with decrease in Zyprexa dose. ?2/16 valproic acid level 55 ?  ?  ?Physical deconditioning 2/2 ambulatory dysfunction ?Patient now ambulating without difficulty ?A physical therapy consult is indicated based on the patient?s mobility assessment. ?  ?  ?Mobility Assessment (last 72 hours)   ?  ?  Mobility Assessment   ?  ?  [place] Name 03/15/21 1700 03/15/21 1200 03/14/21 1200 03/14/21 0952 03/14/21 0830  ?  Does patient have an order for bedrest or is patient medically unstable -- No - Continue assessment -- No  - Continue assessment No - Continue assessment  ?  What is the highest level of mobility based on the progressive mobility assessment? Level 4 (Walks with assist in room) - Balance while marching in place and cannot step forward and back - Complete Level 4 (Walks with assist in room) - Balance while marching in place and cannot step forward and back - Complete -- Level 4 (Walks with assist in room) - Balance while marching in place and cannot step forward and back - Complete Level 4 (Walks with assist in room) - Balance while marching in place and cannot step forward and back - Complete  ?  Is the above level different from baseline mobility prior to current illness? -- Yes - Recommend PT order -- Yes - Recommend PT order Yes - Recommend PT order  ?  ?  [place] Name 03/13/21 2034 03/13/21 0800   ?    ?    ?   ?  Does patient have an order for bedrest or is patient medically unstable No - Continue assessment No - Continue assessment        ?  What is the highest level of mobility based on the progressive mobility assessment? Level 4 (Walks with assist in room) - Balance while marching in place and cannot step forward and back - Complete Level 2 (Chairfast) - Balance while sitting on  edge of bed and cannot stand        ?  Is the above level different from baseline mobility prior to current illness? Yes - Recommend PT order Yes - Recommend PT order        ?  ?   ?  ?  ?   ?  ?  ?  ?  ?Severe sepsis without septic shock  ?Ruled out ?Sepsis-like physiology secondary to presentation with profound dehydration, hypoperfusion from dehydration related hypotension seizure activity ?Chest work-up was negative ?  ?Skin abnormalities/perineal MASD ?Resolved ?  ?Protein calorie malnutrition ([place]) ?Patient able to feed self independently and is eating well ?  ?Left ankle pain ?Again x-ray unremarkable ?Ankle and foot somewhat reddened and swollen and quite tender.  Pulse palpable. ?Suspect may be experiencing acute gout flare.  Gassett  and ESR are normal.  CRP only mildly elevated ?Has completed a 5-day course of prednisone.  On 4/3 will discontinue colcichine ? ?  ?  ?  ?Subjective:  ? ?Physical Exam: ?      ?Vitals:  ?  04/15/21 2336 04/16/21 0500

## 2021-04-21 NOTE — Progress Notes (Signed)
Speech Language Pathology Treatment: Cognitive-Linquistic  ?Patient Details ?Name: Steve [name] ?MRN: [id number] ?DOB: 06/03/1965 ?Today's Date: 04/21/2021 ?Time: [id number] ?SLP Time Calculation (min) (ACUTE ONLY): 14 min ? ?Assessment / Plan / Recommendation ?Clinical Impression ? Pt was seen for cognitive-linguistic treatment. He was alert during the session. However, he expressed his frustration regarding his perception of inadequate communication with him about his discharge plan. Pt stated that he would like to have a potential timeline for when he will be discharged. Pt reported visual hallucinations and expressed that he has seen "a bunch of dogs" outside of the window as he stated two weeks prior. He attempted to show this SLP the dogs and eggs on the parapet roof of the hospital. Pt agreed that his cognition is improved compared to when he was admitted, but stated that he has been having difficulty with word retrieval when he becomes frustrated. Compensatory strategies for word retrieval were discussed and pt used these during structured tasks with cues. Cognitive tasks were deferred per pt's request since he stated that he was "too frustrated" to focus. SLP will continue to follow pt.   ?  ?HPI HPI: Pt is a 56 y.o. male admitted from group home (substance abuse rehab?) on 01/05/21 with witnessed seizure, AMS. 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 EEG unremarkable. LP on 12/21 showed staph in CSF, felt to be contaminant. Workup for AKI, rhabdomyolysis. Dx Uremic encephalopathy. Course complicated by c/o L foot pain; imaging negative for acute injury. Pt remains hospitalized due to difficulty with placement. SLP evaluation on 01/26/21: 9/27 on [place] Exam, SLUMS 02/03/21: 2/30; SLUMS 1/27: 1/30. Psych consulted and as of 1/16, pt did not meet criteria for inpatient psychiatric  admission. ?  ?   ?SLP Plan ? Continue with current plan of care ? ?  ?  ?Recommendations for follow up therapy are one component of a multi-disciplinary discharge planning process, led by the attending physician.  Recommendations may be updated based on patient status, additional functional criteria and insurance authorization. ?  ? ?Recommendations  ?   ?   ?    ?   ? ? ? ? Oral Care Recommendations: Oral care BID ?Follow Up Recommendations: Skilled nursing-short term rehab (<3 hours/day) ?Assistance recommended at discharge: Frequent or constant Supervision/Assistance ?SLP Visit Diagnosis: Cognitive communication deficit (R41.841);Attention and concentration deficit ?Attention and concentration deficit following: Other cerebrovascular disease ?Plan: Continue with current plan of care ? ? ? ? ?  ?  ? ?Steve [name], [place], CCC-SLP ?Acute Rehabilitation Services ?Office number [phone] ?Pager [phone] ? ?Horton Marshall ? ?04/21/2021, 2:21 PM ? ? ?

## 2021-04-22 LAB — COMPREHENSIVE METABOLIC PANEL
ALT: 54 U/L — ABNORMAL HIGH (ref 0–44)
AST: 34 U/L (ref 15–41)
Albumin: 3.1 g/dL — ABNORMAL LOW (ref 3.5–5.0)
Alkaline Phosphatase: 31 U/L — ABNORMAL LOW (ref 38–126)
Anion gap: 8 (ref 5–15)
BUN: 28 mg/dL — ABNORMAL HIGH (ref 6–20)
CO2: 23 mmol/L (ref 22–32)
Calcium: 8.9 mg/dL (ref 8.9–10.3)
Chloride: 107 mmol/L (ref 98–111)
Creatinine, Ser: 0.68 mg/dL (ref 0.61–1.24)
GFR, Estimated: >60 mL/min (ref >60)
Glucose, Bld: 85 mg/dL (ref 70–99)
Potassium: 4.1 mmol/L (ref 3.5–5.1)
Sodium: 138 mmol/L (ref 135–145)
Total Bilirubin: 0.4 mg/dL (ref 0.3–1.2)
Total Protein: 6.3 g/dL — ABNORMAL LOW (ref 6.5–8.1)

## 2021-04-22 NOTE — Progress Notes (Signed)
Referral made to [place] with [facility] [phone]) for their memory care/locked unit. Referral faxed to [phone].  ? ?Spoke to [place] with [facility] who reports they are unable to offer a bed as their psychiatrist is not able to provide services for patients with [facility].  ? ?SW will continue SNF/ALF placement efforts and provide updates as available.  ? ?Dellie Burns, MSW, LCSW ?[phone] (coverage) ? ? ?

## 2021-04-22 NOTE — Progress Notes (Signed)
?Progress Note ?  ?  ?PatientJustus [name] EPP:[id number] DOB: 08-15-1965 DOA: 01/05/2021     101 ?DOS: the patient was seen and examined on 04/16/2021 ?  ?Brief hospital course: ?Steve [name] is a 56 y.o. male with history of major depression, anxiety who was in a residential ETOH treatment center for last 5 months. He was brought to the ED on 12/20 from a residential ETOH treatment center for convalescence, combativeness and altered sensorium.  Per collateral history, patient was not acting typical for last few days. ?  ?In the ED, patient was confused, combative. Labs showed AKI with creatinine of 3.27, rhabdomyolysis, abnormal LFTs, elevated WBC count at 23.7 and lactic acid level at 7.6. 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 Blood culture was sent, empiric antibiotic started.  IV fluid resuscitation started and patient was initially admitted to ICU. ?12/21, underwent LP which showed staph in CSF, felt to be contaminant.  EEG did not show any seizures ?12/24, HD catheter was placed but patient did not require HD as creatinine started to improve ?12/31, with clinical improvement, patient was transferred out of ICU to [facility] ?1/18 Cognitive evaluations: 2 on the SLUMS, a 9 on the MMSE, and a 0 on medi-cog and short blessed test ?  ?Assessment and Plan: ?* Cognitive impairment  2/2 Wernicke's encephalopathy and alcoholic dementia ?Initial MRI revealed tiny acute infarct on the right but not significant enough to explain persistent cognitive and behavioral abnormalities. Also evidence of prior lacunar infarct right basal ganglia ?Cognitive screening revealed significant cognitive deficits; 4/5 repeat SLUMS has impoved to 24/30 but still c/w congnitive impsirments  ?Appreciate psychiatric team assistance.  Medication regimen as follows: ?-Increase frequency of Risperdal to 1 mg BID  QHS ?-Continue Prozac 53m ?-Continue Melatonin 170mQHS ?-Continue Trazodone 10056mHS  ?- DECREASE Zyprexa to 2.5 mg QHS ?- INCREASE depakote to 500 mg/1000 mg ?-Depakote lvl and CMP 4/2 ?-May continue Zyprexa 5mg59mh PRN for agitation ?Currently under IVC due to persistent wandering behaviors and threats to [place]? ?**Please note that sitter and IVC are being utilized in the acute hospital setting noting in the hospital setting we are unable to provide the same physical barriers as a locked unit and therefore for the patient's safety until patient is discharged he will need to require a sitter and a locked unit due to his recurrent wandering behaviors which increase at night. ?  ?  ?After discharge recommendations: PLEASE NOTE THAT THIS PT'S REGIMEN OF PSYCHOTROPIC MEDS CAN NEVER BE WEANED AND DC'D DUE TO SEVERITY OF HIS [place]?  ?  ?  ?Witnessed seizure ([name])[place] apparent prior history of seizure disorder noting patient was not on AEDs prior to admission ?Unclear if withdrawal seizure or related to other substances ?3/10 given recent behavioral changes attending felt EEG needed to be completed to rule out atypical seizure activity causing behavioral manifestations.  EEG unremarkable other than for moderate diffuse encephalopathy.  No seizure activity. ?Continue Keppra at recommendation of neurology ?  ?Acute kidney injury with rhabdomyolysis ?Resolved ?Creatinine peaked at 7.48 on 12/26.  Nephrology consulted with initial plans to initiate dialysis including placement of TDC [facility] renal function gradually improved and dialysis not needed and TDC removed ?  ?Eczema ?Lesions initially resolved after short course of topical cortisone but have reemerged.  On 4/5 we will resume  topical cortisone to all areas on extremities abdomen and back. ? ?Possible abscess anterior neck ?4/3 noted on exam--Nontender but red and blanchable ?Continue to follow ?4/3 initiated empiric  antibiotics/Keflex and by 4/6 significant improvement in the appearance of this area ? ? ?Possible Morton's neuroma ?Complaining of numbness and tingling with weightbearing on left great toe as well as the adjacent 2 toes ?Follow ?Patient encouraged to wear arch supportive footwear-shoes located in his closet in room ?Recent low dose Prednisone did not help-consider sch NSAID; 4/4 patient continues to complain of ankle and dorsal foot pain.  Notes some swelling and distention of veins.  Patient encouraged to elevate feet when standing and utilize appropriate footwear when ambulating.  We will go ahead and adjust pain meds as follows: ?Increase Neurontin to 200 mg twice daily, increase oxy to 7.5 mg every 4 hours as needed, and begin scheduled ibuprofen 800 mg every 6 hours for 7 days and follow BMET prn ? ?Constipation ?Resolved ? ?Possible DVT (deep venous thrombosis)  ?Per critical care note, hemodialysis cath had a clot on it when it was removed.  UE duplex  12/31: no DVT.   ?  ?Right ventricular dysfunction ?Echo 1/2 with EF 60 to 65%.  Could not rule out a small PFO but this finding determined to be clinically insignificant given the patient was asymptomatic ?  ?Elevated transaminases at time of presentation ?Likely secondary to sepsis like physiology at presentation ?His AST and ALT were significantly elevated to over 700s on admission.  ?Re emergence mild elevation LFTs-likely due to Zyprexa and Depakote ?Due to oversedation Zyprexa dose decreased ?LFTs have normalized with decrease in Zyprexa dose. ?2/16 valproic acid level 55 ?  ?  ?Physical deconditioning 2/2 ambulatory dysfunction ?Patient now ambulating without difficulty ?A physical therapy consult is indicated based on the patient?s mobility assessment. ?  ?  ?Mobility Assessment (last 72 hours)   ?  ?  Mobility Assessment   ?  ?  [place] Name 03/15/21 1700 03/15/21 1200 03/14/21 1200 03/14/21 0952 03/14/21 0830  ?  Does patient have an order for bedrest  or is patient medically unstable -- No - Continue assessment -- No - Continue assessment No - Continue assessment  ?  What is the highest level of mobility based on the progressive mobility assessment? Level 4 (Walks with assist in room) - Balance while marching in place and cannot step forward and back - Complete Level 4 (Walks with assist in room) - Balance while marching in place and cannot step forward and back - Complete -- Level 4 (Walks with assist in room) - Balance while marching in place and cannot step forward and back - Complete Level 4 (Walks with assist in room) - Balance while marching in place and cannot step forward and back - Complete  ?  Is the above level different from baseline mobility prior to current illness? -- Yes - Recommend PT order -- Yes - Recommend PT order Yes - Recommend PT order  ?  ?  [place] Name 03/13/21 2034 03/13/21 0800   ?    ?    ?   ?  Does patient have an order for bedrest or is patient medically unstable No - Continue assessment No - Continue assessment        ?  What is the highest level of mobility based on the progressive mobility assessment? Level 4 (Walks with assist in room) - Balance while marching in place and cannot step forward and  back - Complete Level 2 (Chairfast) - Balance while sitting on edge of bed and cannot stand        ?  Is the above level different from baseline mobility prior to current illness? Yes - Recommend PT order Yes - Recommend PT order        ?  ?   ?  ?  ?   ?  ?  ?  ?  ?Severe sepsis without septic shock  ?Ruled out ?Sepsis-like physiology secondary to presentation with profound dehydration, hypoperfusion from dehydration related hypotension seizure activity ?Chest work-up was negative ?  ?Skin abnormalities/perineal MASD ?Resolved ?  ?Protein calorie malnutrition ([place]) ?Patient able to feed self independently and is eating well ?  ?Left ankle pain ?Again x-ray unremarkable ?Ankle and foot somewhat reddened and swollen and quite tender.   Pulse palpable. ?Suspect may be experiencing acute gout flare.  Gassett and ESR are normal.  CRP only mildly elevated ?Has completed a 5-day course of prednisone.  On 4/3 will discontinue colcichine ? ?  ?  ?  ?Subjective:

## 2021-04-23 ENCOUNTER — Encounter (HOSPITAL_COMMUNITY): Payer: Medicaid Other

## 2021-04-23 ENCOUNTER — Inpatient Hospital Stay (HOSPITAL_COMMUNITY): Payer: Medicaid Other

## 2021-04-23 DIAGNOSIS — E512 Wernicke's encephalopathy: Secondary | ICD-10-CM | POA: Diagnosis not present

## 2021-04-23 LAB — VALPROIC ACID LEVEL: Valproic Acid Lvl: 60 ug/mL (ref 50.0–100.0)

## 2021-04-23 IMAGING — CR DG KNEE COMPLETE 4+V*L*
5 series · 5 of 5 positions shown · non-contrast
Comparison: None.

CLINICAL DATA: Pain

EXAM:
LEFT KNEE - COMPLETE 4+ VIEW

[knee ap]
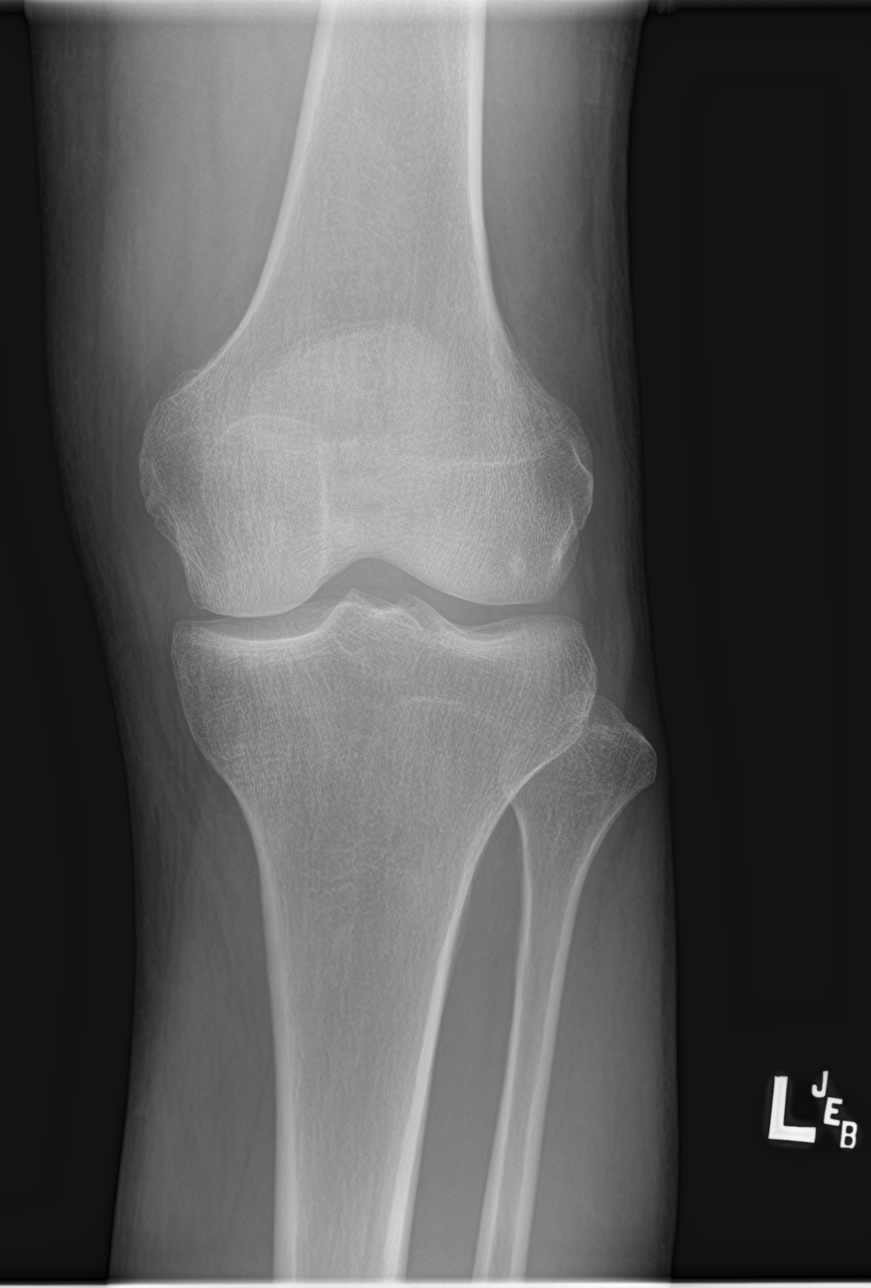

[knee obl (1 of 2)]
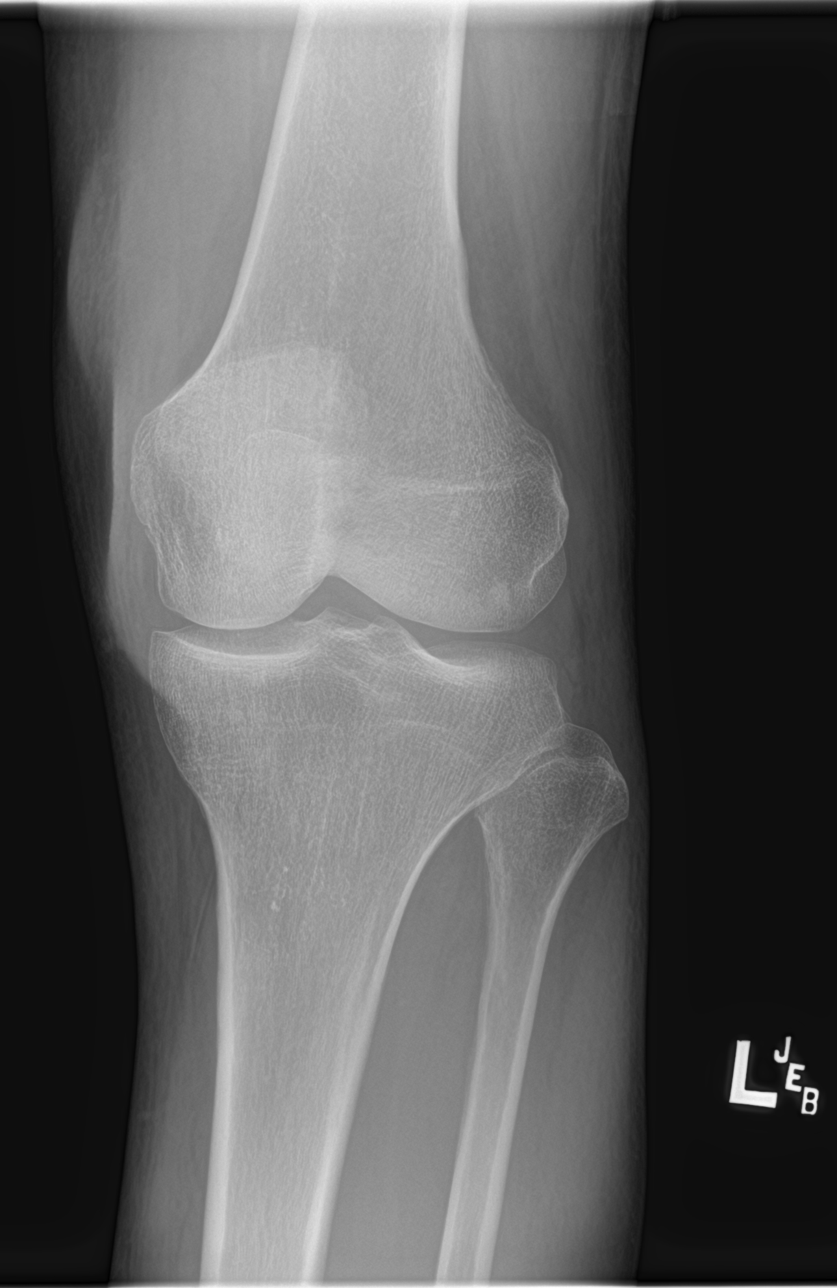

[knee obl (2 of 2)]
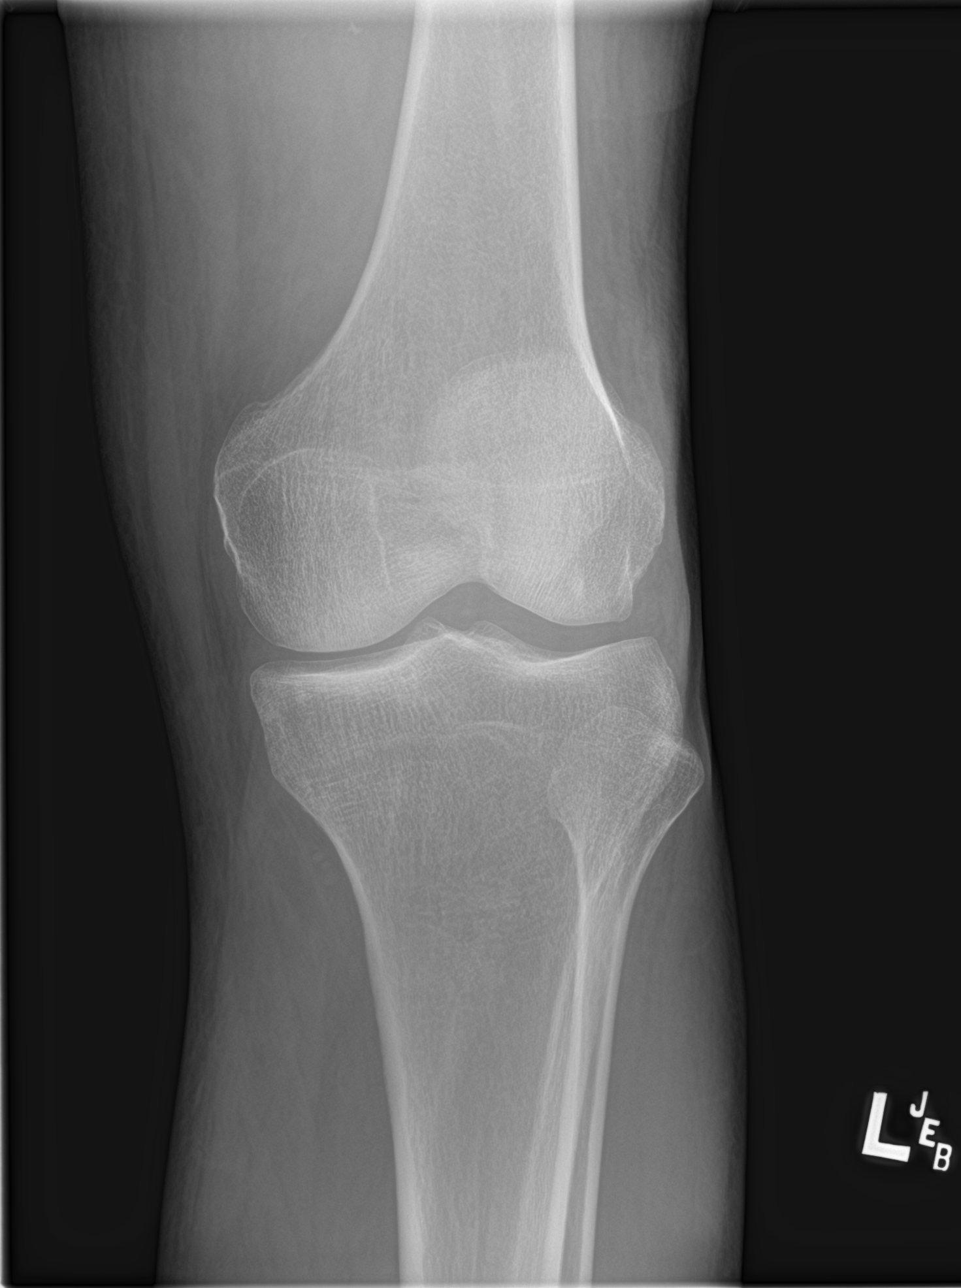

[knee lat]
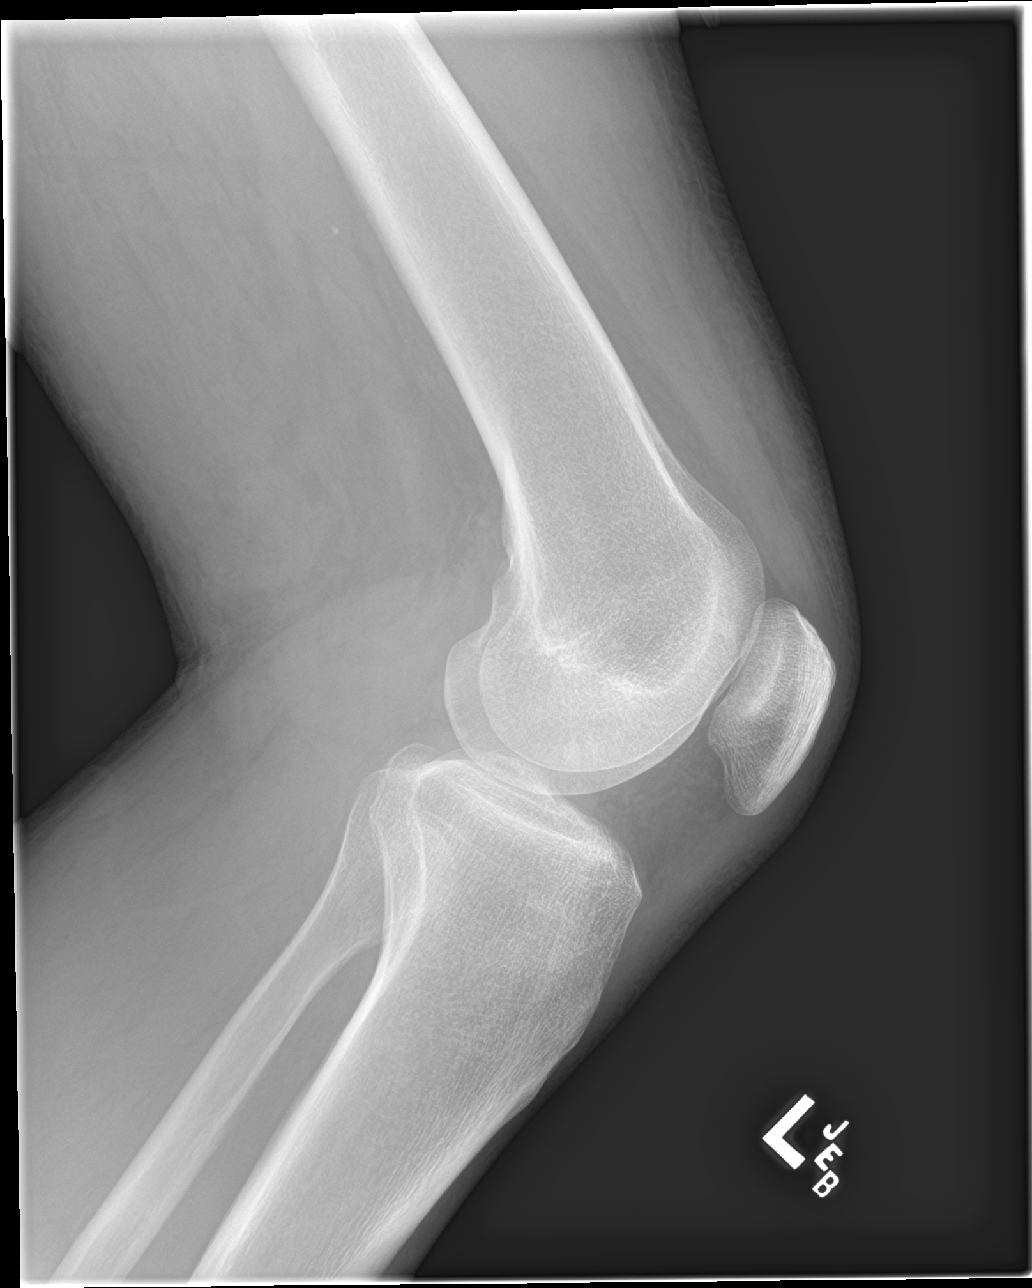

[knee sunrise]
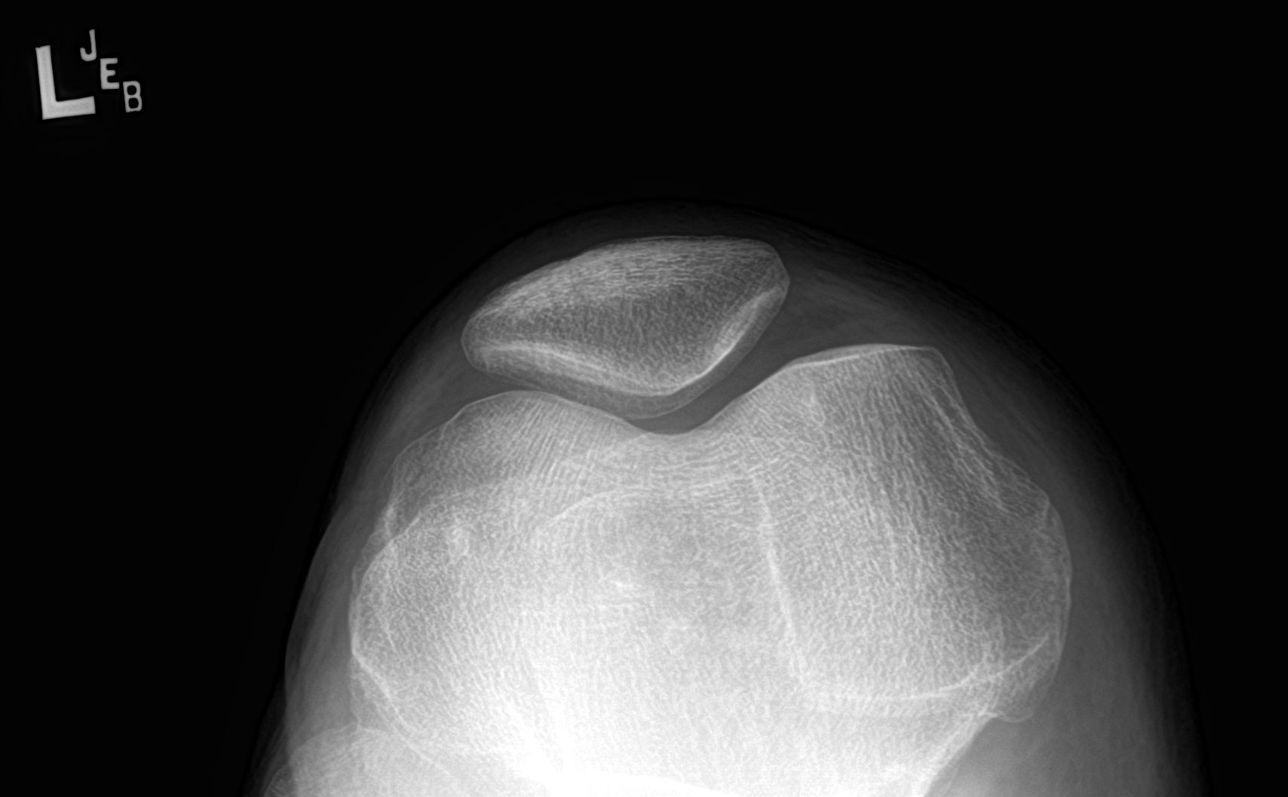

[5 of 5 positions shown; findings below may reference images not displayed]

FINDINGS: There is no evidence of acute fracture or dislocation. There is no
significant joint effusion. There is mild medial compartment
degenerative change. Mild prepatellar soft tissue swelling.
IMPRESSION: No acute fracture or significant joint effusion. Mild prepatellar
soft tissue swelling.

Mild medial compartment osteoarthritis.

## 2021-04-23 NOTE — Progress Notes (Signed)
Spoke to [employer] with [facility] who reports they are willing to consider pt for their locked unit again given the IVC has expired. [facility]'s DON will need to complete a bedside eval but won't be back in the office until Tuesday 4/11. DTP team will continue placement efforts and provide updates as available.  ? ?Steve [name], MSW, LCSW ?[phone] (coverage) ? ? ?

## 2021-04-23 NOTE — Progress Notes (Addendum)
?Progress Note ?  ?  ?PatientShamir [name] OXB:[id number] DOB: 05-16-1965 DOA: 01/05/2021     101 ?DOS: the patient was seen and examined on 04/16/2021 ?  ?Brief hospital course: ?Steve [name] is a 56 y.o. male with history of major depression, anxiety who was in a residential ETOH treatment center for last 5 months. He was brought to the ED on 12/20 from a residential ETOH treatment center for convalescence, combativeness and altered sensorium.  Per collateral history, patient was not acting typical for last few days. ?  ?In the ED, patient was confused, combative. Labs showed AKI with creatinine of 3.27, rhabdomyolysis, abnormal LFTs, elevated WBC count at 23.7 and lactic acid level at 7.6. 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 Blood culture was sent, empiric antibiotic started.  IV fluid resuscitation started and patient was initially admitted to ICU. ?12/21, underwent LP which showed staph in CSF, felt to be contaminant.  EEG did not show any seizures ?12/24, HD catheter was placed but patient did not require HD as creatinine started to improve ?12/31, with clinical improvement, patient was transferred out of ICU to [facility]. ?1/18 Cognitive evaluations: 2 on the SLUMS, a 9 on the MMSE, and a 0 on medi-cog and short blessed test ?  ?Assessment and Plan: ?* Cognitive impairment  2/2 Wernicke's encephalopathy and alcoholic dementia ?Initial MRI revealed tiny acute infarct on the right but not significant enough to explain persistent cognitive and behavioral abnormalities. Also evidence of prior lacunar infarct right basal ganglia ?Cognitive screening revealed significant cognitive deficits; 4/5 repeat SLUMS has impoved to 24/30 but still c/w congnitive impsirments  ?Appreciate psychiatric team assistance.  Medication regimen as follows: ?-Increase frequency of Risperdal to 1 mg BID  QHS ?-Continue Prozac 40mg  ?-Continue Melatonin 10mg  QHS ?-Continue Trazodone 100mg  QHS  ?- DECREASE Zyprexa to 2.5 mg QHS ?- INCREASE depakote to 500 mg/1000 mg ?-Depakote lvl and CMP 4/2 ?-May continue Zyprexa 5mg  q8h PRN for agitation ?Currently under IVC due to persistent wandering behaviors and threats to leave AGAINST MEDICAL ADVICE.   ?Patient's overall calmness and outlook have improved now that sitter is going off the unit with patient so he is not stuck in his room ?Plan to allow IVC to expire but will continue sitter ? ?**Please note that sitter and IVC are being utilized in the acute hospital setting noting in the hospital setting we are unable to provide the same physical barriers as a locked unit and therefore for the patient's safety until patient is discharged he will need to require a sitter and a locked unit due to his recurrent wandering behaviors which increase at night. ?  ?  ?After discharge recommendations: PLEASE NOTE THAT THIS PT'S REGIMEN OF PSYCHOTROPIC MEDS CAN NEVER BE WEANED AND DC'D DUE TO SEVERITY OF HIS WERNICKE'S DEMENTIA ?  ?Pain left knee ?New finding primarily over patella-noted with palpation ?X-ray with 4 views/patella neg except for OA ? ?Swelling LLE ?Has had some chronic swelling and pain in left foot and ankle ?Extended to [place] ?As a precaution we will check venous duplex to rule out DVT ? ?Witnessed seizure ([name]) ?No apparent prior history of seizure disorder noting patient was not on AEDs prior to admission ?Unclear if withdrawal seizure or related to other substances ?3/10 given recent behavioral changes attending felt EEG needed to be completed to rule out atypical seizure activity causing behavioral manifestations.  EEG unremarkable other than for moderate diffuse encephalopathy.  No seizure activity. ?Continue Keppra at recommendation of neurology ?  ?Acute kidney injury with rhabdomyolysis ?Resolved ?Creatinine peaked at 7.48 on 12/26.  Nephrology consulted with  initial plans to initiate dialysis including placement of [facility] but renal function gradually improved and dialysis not needed and TDC removed ?  ?Eczema ?Lesions initially resolved after short course of topical cortisone but have reemerged.  On 4/5 we will resume topical cortisone to all areas on extremities abdomen and back. ? ?Possible abscess anterior neck ?4/3 noted on exam--Nontender but red and blanchable ?Continue to follow ?4/3 initiated empiric antibiotics/Keflex and by 4/6 significant improvement in the appearance of this area ? ? ?Possible Morton's neuroma ?Complaining of numbness and tingling with weightbearing on left great toe as well as the adjacent 2 toes ?Follow ?Patient encouraged to wear arch supportive footwear-shoes located in his closet in room ?Recent low dose Prednisone did not help-consider sch NSAID; 4/4 patient continues to complain of ankle and dorsal foot pain.  Notes some swelling and distention of veins.  Patient encouraged to elevate feet when standing and utilize appropriate footwear when ambulating.  We will go ahead and adjust pain meds as follows: ?Increase Neurontin to 200 mg twice daily, increase oxy to 7.5 mg every 4 hours as needed, and begin scheduled ibuprofen 800 mg every 6 hours for 7 days and follow BMET prn ? ?Constipation ?Resolved ? ?Possible DVT (deep venous thrombosis)  ?Per critical care note, hemodialysis cath had a clot on it when it was removed.  UE duplex  12/31: no DVT.   ?  ?Right ventricular dysfunction ?Echo 1/2 with EF 60 to 65%.  Could not rule out a small PFO but this finding determined to be clinically insignificant given the patient was asymptomatic ?  ?Elevated transaminases at time of presentation ?Likely secondary to sepsis like physiology at presentation ?His AST and ALT were significantly elevated to over 700s on admission.  ?Re emergence mild elevation LFTs-likely due to Zyprexa and Depakote ?Due to oversedation Zyprexa dose decreased ?LFTs have  normalized with decrease in Zyprexa dose. ?2/16 valproic acid level 55 ?  ?  ?Physical deconditioning 2/2 ambulatory dysfunction ?Patient now ambulating without difficulty ?A physical therapy consult is indicated based on the patient?s mobility assessment. ?  ?  ?Mobility Assessment (last 72 hours)   ?  ?  Mobility Assessment   ?  ?  [place] Name 03/15/21 1700 03/15/21 1200 03/14/21 1200 03/14/21 0952 03/14/21 0830  ?  Does patient have an order for bedrest or is patient medically unstable -- No - Continue assessment -- No - Continue assessment No - Continue assessment  ?  What is the highest level of mobility based on the progressive mobility assessment? Level 4 (Walks with assist in room) - Balance while marching in place and cannot step forward and back - Complete Level 4 (Walks with assist in room) - Balance while marching in place and cannot step forward and back - Complete -- Level 4 (Walks with assist in room) - Balance while marching in place and cannot step forward and back - Complete Level 4 (Walks with assist in room) - Balance while marching in place and cannot step forward and back - Complete  ?  Is the above level different from baseline mobility prior to current illness? -- Yes - Recommend PT order -- Yes - Recommend PT order Yes - Recommend PT order  ?  ?  Row Name 03/13/21  2034 03/13/21 0800   ?    ?    ?   ?  Does patient have an order for bedrest or is patient medically unstable No - Continue assessment No - Continue assessment        ?  What is the highest level of mobility based on the progressive mobility assessment? Level 4 (Walks with assist in room) - Balance while marching in place and cannot step forward and back - Complete Level 2 (Chairfast) - Balance while sitting on edge of bed and cannot stand        ?  Is the above level different from baseline mobility prior to current illness? Yes - Recommend PT order Yes - Recommend PT order        ?  ?   ?  ?  ?   ?  ?  ?  ?  ?Severe sepsis without  septic shock  ?Ruled out ?Sepsis-like physiology secondary to presentation with profound dehydration, hypoperfusion from dehydration related hypotension seizure activity ?Chest work-up was negative ?  ?Skin abnormalitie

## 2021-04-24 ENCOUNTER — Inpatient Hospital Stay (HOSPITAL_COMMUNITY): Payer: Medicaid Other

## 2021-04-24 DIAGNOSIS — M79672 Pain in left foot: Secondary | ICD-10-CM

## 2021-04-24 DIAGNOSIS — M79662 Pain in left lower leg: Secondary | ICD-10-CM

## 2021-04-24 DIAGNOSIS — R569 Unspecified convulsions: Secondary | ICD-10-CM

## 2021-04-24 DIAGNOSIS — E512 Wernicke's encephalopathy: Secondary | ICD-10-CM | POA: Diagnosis not present

## 2021-04-24 DIAGNOSIS — L989 Disorder of the skin and subcutaneous tissue, unspecified: Secondary | ICD-10-CM

## 2021-04-24 DIAGNOSIS — N179 Acute kidney failure, unspecified: Secondary | ICD-10-CM

## 2021-04-24 DIAGNOSIS — R5381 Other malaise: Secondary | ICD-10-CM

## 2021-04-24 DIAGNOSIS — I519 Heart disease, unspecified: Secondary | ICD-10-CM

## 2021-04-24 DIAGNOSIS — L308 Other specified dermatitis: Secondary | ICD-10-CM | POA: Diagnosis not present

## 2021-04-24 NOTE — Progress Notes (Signed)
?Progress Note ?  ?  ?PatientAlishan [name] [id number] DOB: [birth year] DOA: 01/05/2021     101 ?  ?Brief hospital course: ?Steve [name] is a 56 y.o. male with history of major depression, anxiety was brought in to the ED on 01/05/2022 from residential alcohol treatment center for combativeness, altered sensorium.   ?In the ED, patient was confused, combative. Labs showed AKI with creatinine of 3.27, rhabdomyolysis, abnormal LFTs, elevated WBC count at 23.7 and lactic acid level at 7.6. 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 Blood culture was sent, empiric antibiotic started.  IV fluid resuscitation started and patient was initially admitted to ICU. ?12/21, underwent LP which showed staph in CSF, felt to be contaminant.  EEG did not show any seizures ?12/24, HD catheter was placed but patient did not require HD as creatinine started to improve ?12/31, with clinical improvement, patient was transferred out of ICU to [facility]. ?1/18 Cognitive evaluations: 2 on the SLUMS, a 9 on the MMSE, and a 0 on medi-cog and short blessed test ?  ?Assessment and Plan: ?* Cognitive impairment  2/2 Wernicke's encephalopathy and alcoholic dementia ?Initial MRI revealed tiny acute infarct on the right but not significant enough to explain persistent cognitive and behavioral abnormalities. Also evidence of prior lacunar infarct right basal ganglia ?Cognitive screening revealed significant cognitive deficits; 4/5 repeat SLUMS has impoved to 24/30 but still c/w congnitive impsirments.  Patient was seen by psychiatry during hospitalization and medications have been adjusted.  Currently under IVC due to persistent wandering behaviors and threats to leave [name].   Plan to allow IVC to expire but will continue sitter ?  ?After discharge recommendations: PLEASE NOTE THAT QUESTIONABLE ABSCESS PT'S REGIMEN  OF PSYCHOTROPIC MEDS CAN NEVER BE WEANED AND DC'D DUE TO SEVERITY OF HIS [place] ?  ?Pain left knee ?New finding primarily over patella-noted with palpation ?X-ray with 4 views/patella negative for acute findings. ? ?Swelling LLE ?Has had some chronic swelling and pain in left foot and ankle ?Extended to [place].  Check ultrasound of the lower extremity for DVT. ? ?Witnessed seizure ([place]) ?Prior history of seizures.  On 3/10 given recent behavioral changes attending felt EEG needed to be completed to rule out atypical seizure activity causing behavioral manifestations.  EEG unremarkable other than for moderate diffuse encephalopathy.  No seizure activity. ?Continue Keppra at recommendation of neurology ?  ?Acute kidney injury with rhabdomyolysis ?Resolved. Creatinine peaked at 7.48 on 12/26.  Nephrology consulted with initial plans to initiate dialysis including placement of [facility] but renal function gradually improved and dialysis not needed and TDC removed.  Latest creatinine of 0.6. ?  ?Eczema ?Continue cream. ? ?Cellulitis/questionable early abscess in the right neck. ?Improved with Keflex. ? ?Possible Morton's neuroma ?Complaining of numbness and tingling with weightbearing on left great toe as well as the adjacent 2 toes.  Encouraged to use boot.  On Neurontin and oxycodone Motrin ? ?Constipation ?Bowel regimen as needed. ? ?Right ventricular dysfunction ?Echo 1/2 with EF 60 to 65%.  Could not rule out a small PFO but this finding determined to be clinically insignificant given the patient was asymptomatic ?  ?Elevated transaminases at time of presentation ?Likely secondary to sepsis like physiology at presentation. His AST and ALT were significantly elevated to over 700s on admission. Re emergence mild elevation LFTs-likely due to Zyprexa and Depakote. LFTs have improved with decrease in  Zyprexa dose. ?  ?Physical deconditioning 2/2 ambulatory dysfunction ?Patient now ambulating without  difficulty ? ?Protein calorie malnutrition ([name]) ?Patient able to feed self independently and is eating well ?  ?Left ankle pain ?Received prednisone and colchicine for possible gout  ?  ?Subjective:  ?Today, patient was seen and examined at bedside.  Denies any pain, nausea, vomiting, fever or chills.  Explained to patient still waiting on disability and Medicaid approval.   ? ?Physical Exam: ?      ?Vitals:  ?  04/15/21 2336 04/16/21 0500 04/16/21 0751 04/16/21 0800  ?BP: 100/64 105/66 97/67    ?Pulse: 83 87 70    ?Resp: 16 18 19     ?Temp: 98.2 ?F (36.8 ?C) 97.9 ?F (36.6 ?C) (!) 97.4 ?F (36.3 ?C) 97.8 ?F (36.6 ?C)  ?TempSrc: Oral Oral Oral    ?SpO2: 98% 97% 100%    ?Weight:          ?Height:          ?  ?General:  Average built, not in obvious distress, calm, sitting on the side of the bed. ?HENT:   No scleral pallor or icterus noted. Oral mucosa is moist.  ?Chest:  Clear breath sounds.  Diminished breath sounds bilaterally. No crackles or wheezes.  ?CVS: S1 &S2 heard. No murmur.  Regular rate and rhythm. ?Abdomen: Soft, nontender, nondistended.  Bowel sounds are heard.   ?Extremities: No cyanosis, clubbing or edema.  Peripheral pulses are palpable. ?Psych: Alert, awake and oriented to place and person.  Appears mildly confused and lacks insight to his problems ?CNS:  No cranial nerve deficits.  Power equal in all extremities.   ?Skin: Warm and dry.  No rashes noted. ? ?  ?Family Communication:  ?Patient only ?  ?Disposition: ?Remains inpatient appropriate because:  ?Unsafe discharge plan secondary to severe cognitive impairment requiring SNFvs ALF placement-requires a locked unit due to wandering behavior.  Currently lacks funding. ?  ?   ? DVT Prophylaxis   ?  Rivaroxaban (xarelto) tablet 10 mg , Rivaroxaban (xarelto) tablet 10 mg  ?  ?Planned Discharge Destination:  ?Skilled nursing facility for custodial care in a locked unit ? ?Consultants: ?Nephrology ?Psychiatry ?  ? ?Procedures: ?EEG ?Echocardiogram ?TDC ?   ? Antibiotics: ?None currently ?  ?  ?Time spent: 15 minutes ?  ?Author: ? , MD ? ?  ?

## 2021-04-24 NOTE — Progress Notes (Signed)
VASCULAR LAB ? ? ? ?Left lower extremity venous duplex has been performed. ? ?See CV proc for preliminary results. ? ? ?Bridgett Hattabaugh, RVT ?04/24/2021, 10:17 AM ? ?

## 2021-04-25 DIAGNOSIS — R5381 Other malaise: Secondary | ICD-10-CM | POA: Diagnosis not present

## 2021-04-25 DIAGNOSIS — N179 Acute kidney failure, unspecified: Secondary | ICD-10-CM | POA: Diagnosis not present

## 2021-04-25 DIAGNOSIS — L308 Other specified dermatitis: Secondary | ICD-10-CM | POA: Diagnosis not present

## 2021-04-25 DIAGNOSIS — E512 Wernicke's encephalopathy: Secondary | ICD-10-CM | POA: Diagnosis not present

## 2021-04-25 NOTE — Progress Notes (Signed)
?Progress Note ?  ?  ?PatientAlishan [name] [id number] DOB: [birth year] DOA: 01/05/2021     101 ?  ?Brief hospital course: ?Steve [name] is a 56 y.o. male with history of major depression, anxiety was brought in to the ED on 01/05/2022 from residential alcohol treatment center for combativeness, altered sensorium.   ?In the ED, patient was confused, combative. Labs showed AKI with creatinine of 3.27, rhabdomyolysis, abnormal LFTs, elevated WBC count at 23.7 and lactic acid level at 7.6. 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 Blood culture was sent, empiric antibiotic started.  IV fluid resuscitation started and patient was initially admitted to ICU. ?12/21, underwent LP which showed staph in CSF, felt to be contaminant.  EEG did not show any seizures ?12/24, HD catheter was placed but patient did not require HD as creatinine started to improve ?12/31, with clinical improvement, patient was transferred out of ICU to [facility]. ?1/18 Cognitive evaluations: 2 on the SLUMS, a 9 on the MMSE, and a 0 on medi-cog and short blessed test ?  ?Assessment and Plan: ?* Cognitive impairment  2/2 Wernicke's encephalopathy and alcoholic dementia ?Initial MRI revealed tiny acute infarct on the right but not significant enough to explain persistent cognitive and behavioral abnormalities. Also evidence of prior lacunar infarct right basal ganglia ?Cognitive screening revealed significant cognitive deficits; 4/5 repeat SLUMS has impoved to 24/30 but still c/w congnitive impsirments.  Patient was seen by psychiatry during hospitalization and medications have been adjusted.  Currently under IVC due to persistent wandering behaviors and threats to leave [name].   Plan to allow IVC to expire but will continue sitter ?  ?After discharge recommendations: PLEASE NOTE THAT QUESTIONABLE ABSCESS PT'S REGIMEN  OF PSYCHOTROPIC MEDS CAN NEVER BE WEANED AND DC'D DUE TO SEVERITY OF HIS [place] ?  ?Pain left knee ?New finding primarily over patella-noted with palpation ?X-ray with 4 views/patella negative for acute findings. ? ?Swelling LLE ?Has had some chronic swelling and pain in left foot and ankle ?Extended to [place].  Check ultrasound of the lower extremity for DVT. ? ?Witnessed seizure ([place]) ?Prior history of seizures.  On 3/10 given recent behavioral changes attending felt EEG needed to be completed to rule out atypical seizure activity causing behavioral manifestations.  EEG unremarkable other than for moderate diffuse encephalopathy.  No seizure activity. ?Continue Keppra at recommendation of neurology ?  ?Acute kidney injury with rhabdomyolysis ?Resolved. Creatinine peaked at 7.48 on 12/26.  Nephrology consulted with initial plans to initiate dialysis including placement of [facility] but renal function gradually improved and dialysis not needed and TDC removed.  Latest creatinine of 0.6. ?  ?Eczema ?Continue cream. ? ?Cellulitis/questionable early abscess in the right neck. ?Improved with Keflex. ? ?Possible Morton's neuroma ?Complaining of numbness and tingling with weightbearing on left great toe as well as the adjacent 2 toes.  Encouraged to use boot.  On Neurontin and oxycodone Motrin ? ?Constipation ?Bowel regimen as needed. ? ?Right ventricular dysfunction ?Echo 1/2 with EF 60 to 65%.  Could not rule out a small PFO but this finding determined to be clinically insignificant given the patient was asymptomatic ?  ?Elevated transaminases at time of presentation ?Likely secondary to sepsis like physiology at presentation. His AST and ALT were significantly elevated to over 700s on admission. Re emergence mild elevation LFTs-likely due to Zyprexa and Depakote. LFTs have improved with decrease in  Zyprexa dose. ?  ?Physical deconditioning 2/2 ambulatory dysfunction ?Patient now ambulating without  difficulty ? ?Protein calorie malnutrition ([place]) ?Patient able to feed self independently and is eating well ?  ?Left ankle pain ?Received prednisone and colchicine for possible gout  ?  ?Subjective:  ?Today, patient was seen and examined at bedside.  Denies interval complaints.  Denies any nausea vomiting fever chills diarrhea.  ? ?Physical Exam: ?      ?Vitals:  ?  04/15/21 2336 04/16/21 0500 04/16/21 0751 04/16/21 0800  ?BP: 100/64 105/66 97/67    ?Pulse: 83 87 70    ?Resp: 16 18 19     ?Temp: 98.2 ?F (36.8 ?C) 97.9 ?F (36.6 ?C) (!) 97.4 ?F (36.3 ?C) 97.8 ?F (36.6 ?C)  ?TempSrc: Oral Oral Oral    ?SpO2: 98% 97% 100%    ?Weight:          ?Height:          ?  ?General:  Average built, not in obvious distress ?HENT:   No scleral pallor or icterus noted. Oral mucosa is moist.  ?Chest:  Clear breath sounds.  Diminished breath sounds bilaterally. No crackles or wheezes.  ?CVS: S1 &S2 heard. No murmur.  Regular rate and rhythm. ?Abdomen: Soft, nontender, nondistended.  Bowel sounds are heard.   ?Extremities: No cyanosis, clubbing or edema.  Peripheral pulses are palpable. ?Psych: Appears mildly confused and lacks insight to his problems ?CNS:  No cranial nerve deficits.  Power equal in all extremities.   ?Skin: Warm and dry.  No rashes noted. ? ?  ?Family Communication:  ?Patient only ?  ?Disposition: ?Remains inpatient appropriate because:  ?Unsafe discharge plan secondary to severe cognitive impairment requiring SNFvs ALF placement-requires a locked unit due to wandering behavior.  Currently lacks funding. ?  ?   ? DVT Prophylaxis   ?  Rivaroxaban (xarelto) tablet 10 mg , Rivaroxaban (xarelto) tablet 10 mg  ?  ?Planned Discharge Destination:  ?Skilled nursing facility for custodial care in a locked unit ? ?Consultants: ?Nephrology ?Psychiatry ?  ?Procedures: ?EEG ?Echocardiogram ?TDC ?  ? Antibiotics: ?None currently ?  ?  ?Time spent: 15 minutes ?  ?Author: ?Flora Lipps, MD ? ?  ?

## 2021-04-25 NOTE — Plan of Care (Signed)
  Problem: Coping: Goal: Level of anxiety will decrease Outcome: Progressing   Problem: Pain Managment: Goal: General experience of comfort will improve Outcome: Progressing   Problem: Safety: Goal: Ability to remain free from injury will improve Outcome: Progressing   

## 2021-04-26 DIAGNOSIS — E512 Wernicke's encephalopathy: Secondary | ICD-10-CM | POA: Diagnosis not present

## 2021-04-26 NOTE — Progress Notes (Signed)
?Progress Note ?  ?  ?PatientShamir [name] OXB:[id number] DOB: 05-16-1965 DOA: 01/05/2021     101 ?DOS: the patient was seen and examined on 04/16/2021 ?  ?Brief hospital course: ?Steve [name] is a 56 y.o. male with history of major depression, anxiety who was in a residential ETOH treatment center for last 5 months. He was brought to the ED on 12/20 from a residential ETOH treatment center for convalescence, combativeness and altered sensorium.  Per collateral history, patient was not acting typical for last few days. ?  ?In the ED, patient was confused, combative. Labs showed AKI with creatinine of 3.27, rhabdomyolysis, abnormal LFTs, elevated WBC count at 23.7 and lactic acid level at 7.6. 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 Blood culture was sent, empiric antibiotic started.  IV fluid resuscitation started and patient was initially admitted to ICU. ?12/21, underwent LP which showed staph in CSF, felt to be contaminant.  EEG did not show any seizures ?12/24, HD catheter was placed but patient did not require HD as creatinine started to improve ?12/31, with clinical improvement, patient was transferred out of ICU to [facility]. ?1/18 Cognitive evaluations: 2 on the SLUMS, a 9 on the MMSE, and a 0 on medi-cog and short blessed test ?  ?Assessment and Plan: ?* Cognitive impairment  2/2 Wernicke's encephalopathy and alcoholic dementia ?Initial MRI revealed tiny acute infarct on the right but not significant enough to explain persistent cognitive and behavioral abnormalities. Also evidence of prior lacunar infarct right basal ganglia ?Cognitive screening revealed significant cognitive deficits; 4/5 repeat SLUMS has impoved to 24/30 but still c/w congnitive impsirments  ?Appreciate psychiatric team assistance.  Medication regimen as follows: ?-Increase frequency of Risperdal to 1 mg BID  QHS ?-Continue Prozac 40mg  ?-Continue Melatonin 10mg  QHS ?-Continue Trazodone 100mg  QHS  ?- DECREASE Zyprexa to 2.5 mg QHS ?- INCREASE depakote to 500 mg/1000 mg ?-Depakote lvl and CMP 4/2 ?-May continue Zyprexa 5mg  q8h PRN for agitation ?Currently under IVC due to persistent wandering behaviors and threats to leave AGAINST MEDICAL ADVICE.   ?Patient's overall calmness and outlook have improved now that sitter is going off the unit with patient so he is not stuck in his room ?Plan to allow IVC to expire but will continue sitter ? ?**Please note that sitter and IVC are being utilized in the acute hospital setting noting in the hospital setting we are unable to provide the same physical barriers as a locked unit and therefore for the patient's safety until patient is discharged he will need to require a sitter and a locked unit due to his recurrent wandering behaviors which increase at night. ?  ?  ?After discharge recommendations: PLEASE NOTE THAT THIS PT'S REGIMEN OF PSYCHOTROPIC MEDS CAN NEVER BE WEANED AND DC'D DUE TO SEVERITY OF HIS WERNICKE'S DEMENTIA ?  ?Pain left knee ?New finding primarily over patella-noted with palpation ?X-ray with 4 views/patella neg except for OA ? ?Swelling LLE ?Has had some chronic swelling and pain in left foot and ankle ?Extended to Calf ?As a precaution we will check venous duplex to rule out DVT ? ?Witnessed seizure ([name]) ?No apparent prior history of seizure disorder noting patient was not on AEDs prior to admission ?Unclear if withdrawal seizure or related to other substances ?3/10 given recent behavioral changes attending felt EEG needed to be completed to rule out atypical seizure activity causing behavioral manifestations.  EEG unremarkable other than for moderate diffuse encephalopathy.  No seizure activity. ?Continue Keppra at recommendation of neurology ?  ?Acute kidney injury with rhabdomyolysis ?Resolved ?Creatinine peaked at 7.48 on 12/26.  Nephrology consulted with  [facility] but [facility] function gradually improved and dialysis not needed and TDC removed ?  ?Eczema ?Lesions initially resolved after short course of topical cortisone but have reemerged.  On 4/5 we will resume topical cortisone to all areas on extremities abdomen and back. ? ?Possible abscess anterior neck ?4/3 noted on exam--Nontender but red and blanchable ?Continue to follow ?4/3 initiated empiric antibiotics/Keflex and by 4/6 significant improvement in the appearance of this area ? ? ?Possible Morton's neuroma ?Complaining of numbness and tingling with weightbearing on left great toe as well as the adjacent 2 toes ?Follow ?Patient encouraged to wear arch supportive footwear-shoes located in his closet in room ?Recent low dose Prednisone did not help-consider sch NSAID; 4/4 patient continues to complain of ankle and dorsal foot pain.  Notes some swelling and distention of veins.  Patient encouraged to elevate feet when standing and utilize appropriate footwear when ambulating.  We will go ahead and adjust pain meds as follows: ?Increase Neurontin to 200 mg twice daily, increase oxy to 7.5 mg every 4 hours as needed, and begin scheduled ibuprofen 800 mg every 6 hours for 7 days and follow BMET prn ? ?Constipation ?Resolved ? ?Possible DVT (deep venous thrombosis)  ?Per critical care note, hemodialysis cath had a clot on it when it was removed.  UE duplex  12/31: no DVT.   ?  ?Right ventricular dysfunction ?Echo 1/2 with EF 60 to 65%.  Could not rule out a small PFO but this finding determined to be clinically insignificant given the patient was asymptomatic ?  ?Elevated transaminases at time of presentation ?Likely secondary to sepsis like physiology at presentation ?His AST and ALT were significantly elevated to over 700s on admission.  ?Most recent demonstrate only slight elevation in ALT which has remained stable ?Due to oversedation Zyprexa dose  decreased ?LFTs have normalized with decrease in Zyprexa dose. ?2/16 valproic acid level 55 ?  ?  ?Physical deconditioning 2/2 ambulatory dysfunction ?Patient now ambulating without difficulty ?A physical therapy consult is indicated based on the patient?s mobility assessment. ?  ?  ?Mobility Assessment (last 72 hours)   ?  ?  Mobility Assessment   ?  ?  Row Name 03/15/21 1700 03/15/21 1200 03/14/21 1200 03/14/21 0952 03/14/21 0830  ?  Does patient have an order for bedrest or is patient medically unstable -- No - Continue assessment -- No - Continue assessment No - Continue assessment  ?  What is the highest level of mobility based on the progressive mobility assessment? Level 4 (Walks with assist in room) - Balance while marching in place and cannot step forward and back - Complete Level 4 (Walks with assist in room) - Balance while marching in place and cannot step forward and back - Complete -- Level 4 (Walks with assist in room) - Balance while marching in place and cannot step forward and back - Complete Level 4 (Walks with assist in room) - Balance while marching in place and cannot step forward and back - Complete  ?  Is the above level different from baseline mobility prior to current illness? -- Yes - Recommend PT order -- Yes - Recommend PT order Yes - Recommend PT order  ?  ?  Row  Name 03/13/21 2034 03/13/21 0800   ?    ?    ?   ?  Does patient have an order for bedrest or is patient medically unstable No - Continue assessment No - Continue assessment        ?  What is the highest level of mobility based on the progressive mobility assessment? Level 4 (Walks with assist in room) - Balance while marching in place and cannot step forward and back - Complete Level 2 ([place]) - Balance while sitting on edge of bed and cannot stand        ?  Is the above level different from baseline mobility prior to current illness? Yes - Recommend PT order Yes - Recommend PT order        ?  ?   ?  ?  ?   ?  ?  ?  ?   ?Severe sepsis without septic shock  ?Ruled out ?Sepsis-like physiology secondary to presentation with profound dehydration, hypoperfusion from dehydration related hypotension seizure activity ?Chest work-up was negative ?  ?Skin a

## 2021-04-27 DIAGNOSIS — E512 Wernicke's encephalopathy: Secondary | ICD-10-CM | POA: Diagnosis not present

## 2021-04-27 NOTE — Progress Notes (Signed)
?Progress Note ?  ?  ?PatientAleem [name] TKZ:[id number] DOB: May 10, 1965 DOA: 01/05/2021     101 ?DOS: the patient was seen and examined on 04/16/2021 ?  ?Brief hospital course: ?Steve [name] is a 56 y.o. male with history of major depression, anxiety who was in a residential ETOH treatment center for last 5 months. He was brought to the ED on 12/20 from a residential ETOH treatment center for convalescence, combativeness and altered sensorium.  Per collateral history, patient was not acting typical for last few days. ?  ?In the ED, patient was confused, combative. Labs showed AKI with creatinine of 3.27, rhabdomyolysis, abnormal LFTs, elevated WBC count at 23.7 and lactic acid level at 7.6. 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 Blood culture was sent, empiric antibiotic started.  IV fluid resuscitation started and patient was initially admitted to ICU. ?12/21, underwent LP which showed staph in CSF, felt to be contaminant.  EEG did not show any seizures ?12/24, HD catheter was placed but patient did not require HD as creatinine started to improve ?12/31, with clinical improvement, patient was transferred out of ICU to [facility]. ?1/18 Cognitive evaluations: 2 on the SLUMS, a 9 on the MMSE, and a 0 on medi-cog and short blessed test ?  ?Assessment and Plan: ?* Cognitive impairment  2/2 Wernicke's encephalopathy and alcoholic dementia ?Initial MRI revealed tiny acute infarct on the right but not significant enough to explain persistent cognitive and behavioral abnormalities. Also evidence of prior lacunar infarct right basal ganglia ?Cognitive screening revealed significant cognitive deficits; 4/5 repeat SLUMS has impoved to 24/30 but still c/w congnitive impsirments  ?Appreciate psychiatric team assistance.  Continue Risperdal, Prozac, trazodone, Depakote and melatonin.  Continue Zyprexa as  needed for agitation ?Currently under IVC due to persistent wandering behaviors and threats to leave [place].   ?Patient's overall calmness and outlook have improved now that sitter is going off the unit with patient so he is not stuck in his room ?Continue sitter to allow patient to leave the room in the unit with supervision ? ?**Please note that sitter is being utilized in the acute hospital setting noting in the hospital setting we are unable to provide the same physical barriers as a locked unit and therefore for the patient's safety until patient is discharged he will need to require a sitter and a locked unit due to his recurrent wandering behaviors which increase at night. ?  ? After discharge recommendations: PLEASE NOTE THAT THIS PT'S REGIMEN OF PSYCHOTROPIC MEDS CAN NEVER BE WEANED AND DC'D DUE TO SEVERITY OF HIS [place] ? ?Swelling LLE ?Improved with more consistent elevation in leg and decreased sitting with leg dependent.  Also improved with increased walking and other activity ? ?Witnessed seizure  ?No apparent prior history of seizure disorder noting patient was not on AEDs prior to admission ?Continue Keppra at recommendation of neurology ?  ?Acute kidney injury with rhabdomyolysis ?Resolved ?Creatinine peaked at 7.48 on 12/26.  Nephrology consulted with initial plans to initiate dialysis including placement of [facility] but renal function gradually improved and dialysis not needed and TDC removed ?  ?Eczema ?Lesions initially resolved after short course of topical cortisone but have reemerged.  On 4/5 we will resume topical cortisone to all areas on extremities abdomen and back. ? ?Possible abscess anterior neck ?4/3 and has completed empiric antibiotics/Keflex ? ? ?Possible Morton's neuroma ?Complaining  of numbness and tingling with weightbearing on left great toe as well as the adjacent 2 toes ?Continue supportive care to include appropriate footwear as well as Neurontin,  short-term or Oxy IR and scheduled ibuprofen to be completed after a 7-day period ? ?Constipation ?Resolved ? ?Possible DVT (deep venous thrombosis)  ?Per critical care note, hemodialysis cath had a clot on it when it was removed.  UE duplex  12/31: no DVT.   ?  ?Right ventricular dysfunction ?Echo 1/2 with EF 60 to 65%.  Could not rule out a small PFO but this finding determined to be clinically insignificant given the patient was asymptomatic ?  ?Elevated transaminases at time of presentation ?Likely secondary to sepsis like physiology at presentation ?His AST and ALT were significantly elevated to over 700s on admission.  ?Most recent demonstrate only slight elevation in ALT which has remained stable ?Due to oversedation Zyprexa dose decreased ?LFTs have normalized with decrease in Zyprexa dose. ?2/16 valproic acid level 55 ?  ?  ?Physical deconditioning 2/2 ambulatory dysfunction ?Patient now ambulating without difficulty ?A physical therapy consult is indicated based on the patient?s mobility assessment. ?  ?  ?Mobility Assessment (last 72 hours)   ?  ?  Mobility Assessment   ?  ?  [place] Name 03/15/21 1700 03/15/21 1200 03/14/21 1200 03/14/21 0952 03/14/21 0830  ?  Does patient have an order for bedrest or is patient medically unstable -- No - Continue assessment -- No - Continue assessment No - Continue assessment  ?  What is the highest level of mobility based on the progressive mobility assessment? Level 4 (Walks with assist in room) - Balance while marching in place and cannot step forward and back - Complete Level 4 (Walks with assist in room) - Balance while marching in place and cannot step forward and back - Complete -- Level 4 (Walks with assist in room) - Balance while marching in place and cannot step forward and back - Complete Level 4 (Walks with assist in room) - Balance while marching in place and cannot step forward and back - Complete  ?  Is the above level different from baseline mobility prior  to current illness? -- Yes - Recommend PT order -- Yes - Recommend PT order Yes - Recommend PT order  ?  ?  [place] Name 03/13/21 2034 03/13/21 0800   ?    ?    ?   ?  Does patient have an order for bedrest or is patient medically unstable No - Continue assessment No - Continue assessment        ?  What is the highest level of mobility based on the progressive mobility assessment? Level 4 (Walks with assist in room) - Balance while marching in place and cannot step forward and back - Complete Level 2 (Chairfast) - Balance while sitting on edge of bed and cannot stand        ?  Is the above level different from baseline mobility prior to current illness? Yes - Recommend PT order Yes - Recommend PT order        ?  ?   ?  ?  ?   ?  ?  ?  ?  ?Severe sepsis without septic shock  ?Ruled out ?Sepsis-like physiology secondary to presentation with profound dehydration, hypoperfusion from dehydration related hypotension seizure activity ?Chest work-up was negative ?  ?Skin abnormalities/perineal MASD ?Resolved ?  ?Protein calorie malnutrition ([place]) ?Patient able to feed self independently  and is eating well ?  ?Left ankle pain ?Again x-ray unremarkable ?Ankle and foot somewhat reddened and swollen and quite tender.  Pulse palpable. ?Suspect may be experiencing acute gout flare.  [place] and ESR are normal.  CRP only mildly elevated ?Has completed a 5-day course of prednisone.  On 4/3 will discontinue colcichine ? ?  ?  ?  ?Subjective:  ? ? ?Physical Exam: ?      ?Vitals:  ?  04/15/21 2336 04/16/21 0500 04/16/21 0751 04/16/21 0800  ?BP: 100/64 105/66 97/67    ?Pulse: 83 87 70    ?Resp: 16 18 19     ?Temp: 98.2 ?F (36.8 ?C) 97.9 ?F (36.6 ?C) (!) 97.4 ?F (36.3 ?C) 97.8 ?F (36.6 ?C)  ?TempSrc: Oral Oral Oral    ?SpO2: 98% 97% 100%    ?Weight:          ?Height:          ?  ?Constitutional:  ?Respiratory: Stable on room air, anterior lung sounds clear to auscultation.  Normal respiratory effort ?Cardiovascular: Normotensive, S1-S2, no  peripheral edema.  Regular pulse. ?Abdomen: no tenderness, no masses palpated. Bowel sounds positive. LBM 4/4 ?Neurologic: CN 2-12 grossly intact. Sensation intact, Strength 5/5 x all 4 extremities.  Able to

## 2021-04-28 DIAGNOSIS — E512 Wernicke's encephalopathy: Secondary | ICD-10-CM | POA: Diagnosis not present

## 2021-04-28 DIAGNOSIS — K5900 Constipation, unspecified: Secondary | ICD-10-CM

## 2021-04-28 LAB — BASIC METABOLIC PANEL
Anion gap: 7 (ref 5–15)
BUN: 23 mg/dL — ABNORMAL HIGH (ref 6–20)
CO2: 24 mmol/L (ref 22–32)
Calcium: 9.3 mg/dL (ref 8.9–10.3)
Chloride: 108 mmol/L (ref 98–111)
Creatinine, Ser: 0.61 mg/dL (ref 0.61–1.24)
GFR, Estimated: >60 mL/min (ref >60)
Glucose, Bld: 94 mg/dL (ref 70–99)
Potassium: 4.1 mmol/L (ref 3.5–5.1)
Sodium: 139 mmol/L (ref 135–145)

## 2021-04-28 LAB — CBC WITH DIFFERENTIAL/PLATELET
Abs Immature Granulocytes: 0.04 10*3/uL (ref 0.00–0.07)
Basophils Absolute: 0 10*3/uL (ref 0.0–0.1)
Basophils Relative: 1 %
Eosinophils Absolute: 0.2 10*3/uL (ref 0.0–0.5)
Eosinophils Relative: 4 %
HCT: 34.5 % — ABNORMAL LOW (ref 39.0–52.0)
Hemoglobin: 12 g/dL — ABNORMAL LOW (ref 13.0–17.0)
Immature Granulocytes: 1 %
Lymphocytes Relative: 47 %
Lymphs Abs: 2.4 10*3/uL (ref 0.7–4.0)
MCH: 34.3 pg — ABNORMAL HIGH (ref 26.0–34.0)
MCHC: 34.8 g/dL (ref 30.0–36.0)
MCV: 98.6 fL (ref 80.0–100.0)
Monocytes Absolute: 0.7 10*3/uL (ref 0.1–1.0)
Monocytes Relative: 14 %
Neutro Abs: 1.7 10*3/uL (ref 1.7–7.7)
Neutrophils Relative %: 33 %
Platelets: 221 10*3/uL (ref 150–400)
RBC: 3.5 MIL/uL — ABNORMAL LOW (ref 4.22–5.81)
RDW: 13.7 % (ref 11.5–15.5)
WBC: 5.2 10*3/uL (ref 4.0–10.5)
nRBC: 0 % (ref 0.0–0.2)

## 2021-04-28 MED ORDER — MELATONIN 3 MG PO TABS
3.0000 mg | ORAL_TABLET | Freq: Every day | ORAL | Status: AC
  Administered 2021-04-28 – 2021-04-29 (×2): 3 mg via ORAL
  Filled 2021-04-28 (×2): qty 1

## 2021-04-28 MED ORDER — ONDANSETRON 4 MG PO TBDP
4.0000 mg | ORAL_TABLET | Freq: Three times a day (TID) | ORAL | Status: DC | PRN
  Filled 2021-04-28: qty 1

## 2021-04-28 NOTE — Plan of Care (Signed)
?  Problem: Health Behavior/Discharge Planning: ?Goal: Ability to manage health-related needs will improve ?Outcome: Progressing ?  ?Problem: Clinical Measurements: ?Goal: Ability to maintain clinical measurements within normal limits will improve ?Outcome: Progressing ?Goal: Will remain free from infection ?Outcome: Progressing ?Goal: Diagnostic test results will improve ?Outcome: Progressing ?Goal: Respiratory complications will improve ?Outcome: Progressing ?Goal: Cardiovascular complication will be avoided ?Outcome: Progressing ?  ?Problem: Activity: ?Goal: Risk for activity intolerance will decrease ?Outcome: Progressing ?  ?Problem: Nutrition: ?Goal: Adequate nutrition will be maintained ?Outcome: Progressing ?  ?Problem: Coping: ?Goal: Level of anxiety will decrease ?Outcome: Progressing ?  ?Problem: Elimination: ?Goal: Will not experience complications related to bowel motility ?Outcome: Progressing ?Goal: Will not experience complications related to urinary retention ?Outcome: Progressing ?  ?Problem: Pain Managment: ?Goal: General experience of comfort will improve ?Outcome: Progressing ?  ?Problem: Safety: ?Goal: Ability to remain free from injury will improve ?Outcome: Progressing ?  ?Problem: Skin Integrity: ?Goal: Risk for impaired skin integrity will decrease ?Outcome: Progressing ?  ?Problem: Education: ?Goal: Expressions of having a comfortable level of knowledge regarding the disease process will increase ?Outcome: Progressing ?  ?Problem: Coping: ?Goal: Ability to adjust to condition or change in health will improve ?Outcome: Progressing ?Goal: Ability to identify appropriate support needs will improve ?Outcome: Progressing ?  ?Problem: Health Behavior/Discharge Planning: ?Goal: Compliance with prescribed medication regimen will improve ?Outcome: Progressing ?  ?Problem: Medication: ?Goal: Risk for medication side effects will decrease ?Outcome: Progressing ?  ?Problem: Clinical  Measurements: ?Goal: Complications related to the disease process, condition or treatment will be avoided or minimized ?Outcome: Progressing ?Goal: Diagnostic test results will improve ?Outcome: Progressing ?  ?Problem: Safety: ?Goal: Verbalization of understanding the information provided will improve ?Outcome: Progressing ?  ?Problem: Self-Concept: ?Goal: Level of anxiety will decrease ?Outcome: Progressing ?Goal: Ability to verbalize feelings about condition will improve ?Outcome: Progressing ?  ?

## 2021-04-28 NOTE — TOC Progression Note (Signed)
Transition of Care (TOC) - Progression Note  ? ? ?Patient Details  ?Name: Steve [name] ?MRN: [id number] ?Date of Birth: 1965-08-09 ? ?Transition of Care (TOC) CM/SW Contact  ?Carley Hammed, LCSWA ?Phone Number: ?04/28/2021, 9:31 AM ? ?Clinical Narrative:    ?CSW followed up with several facilities to discuss placement. [facility] are unable to offer as they do not have a locked unit. VM left for [facility].  [facility] reviewing again. [facility] and [facility] have locked units. [facility] is full, but [facility] will review for possible placement. TOC will continue to foll ? ? ?Expected Discharge Plan: Skilled Nursing Facility ?Barriers to Discharge: Continued Medical Work up, Homeless with medical needs, Inadequate or no insurance ? ?Expected Discharge Plan and Services ?Expected Discharge Plan: Skilled Nursing Facility ?In-house Referral: PCP / [facility] ?Discharge Planning Services: CM Consult, MATCH Program, Medication Assistance, Follow-up appt scheduled, Indigent Health Clinic ?  ?Living arrangements for the past 2 months: Group Home (Patient was resident at [facility] [place] prior to hospital admission.) ?Expected Discharge Date: 01/24/21               ?  ?  ?  ?  ?  ?  ?  ?  ?  ?  ? ? ?Social Determinants of Health (SDOH) Interventions ?  ? ?Readmission Risk Interventions ?   ? View : No data to display.  ?  ?  ?  ? ? ?

## 2021-04-28 NOTE — Progress Notes (Signed)
?PROGRESS NOTE ? ? ? ?Steve [name]  XLK:[id number] DOB: [birth year] DOA: 01/05/2021 ?PCP: No primary care provider on file. ? ? ?Chief Complaint  ?Patient presents with  ? Altered Mental Status  ? Seizures  ? ? ?Brief Narrative:  ?Steve [name] is a 56 y.o. male with history of major depression, anxiety was brought in to the ED on 01/05/2022 from residential alcohol treatment center for combativeness, altered sensorium.   ?In the ED, patient was confused, combative. Labs showed AKI with creatinine of 3.27, rhabdomyolysis, abnormal LFTs, elevated WBC count at 23.7 and lactic acid level at 7.6. 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 Blood culture was sent, empiric antibiotic started.  IV fluid resuscitation started and patient was initially admitted to ICU. ?12/21, underwent LP which showed staph in CSF, felt to be contaminant.  EEG did not show any seizures ?12/24, HD catheter was placed but patient did not require HD as creatinine started to improve ?12/31, with clinical improvement, patient was transferred out of ICU to [facility]. ?1/18 Cognitive evaluations: 2 on the SLUMS, a 9 on the MMSE, and a 0 on medi-cog and short blessed test ? ? ?Assessment & Plan: ?  ?Principal Problem: ?  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  Skin abnormalities/perineal MASD ?  Physical deconditioning 2/2 ambulatory dysfunction ?  Right ventricular dysfunction ?  Witnessed seizure ([name]) ?  Eczema ?  Pressure injury of skin ?  Constipation ? ?#1 cognitive impairment secondary to Wernicke's encephalopathy and alcoholic dementia ?-Noted initial MRI revealed a tiny acute infarct in the right but not significant enough to explain patient's persistent cognitive and behavioral abnormalities.  Also noted was prior lacunar infarct right basal ganglia. ?-Cognitive screening  revealed significant cognitive deficits; 4/5 repeat SLUMS improved to 24/30 but still with cognitive impairments. ?-Patient seen in consultation by psychiatry and medications adjusted accordingly per psych recommendations. ?-Patient noted to have been under IVC due to persistent wandering behaviors and threats to leave AMA. ?-Plan was to allow IVC to expire but continue sitter. ?-It is noted that patient's overall calmness and outlook have improved now that he is allowed to go off the unit with a sitter so he is not stuck in the room. ?**Please note that sitter is being utilized in the acute hospital setting noting in the hospital setting we are unable to provide the same physical barriers as a locked unit and therefore for the patient's safety until patient is discharged he will need to require a sitter and a locked unit due to his recurrent wandering behaviors which increase at night. ? ?After discharge recommendations: PLEASE NOTE THAT THIS PT'S REGIMEN OF PSYCHOTROPIC MEDS CAN NEVER BE WEANED AND DC'D DUE TO SEVERITY OF HIS WERNICKE'S DEMENTIA ? ?2.  Left knee pain ?-Noted to be a new finding primarily over the patella noted with palpation. ?-Plain films done negative. ? ?3.  Left lower extremity swelling ?-Patient noted to have some chronic swelling and pain in the left foot and ankle extending to the calf. ?-Lower extremity Dopplers negative for DVT. ?-Plain films negative for any acute fracture. ?-Supportive care. ? ?4.  Witnessed seizure ?-Patient noted with prior history of seizures. ?-On 3/10 given recent behavior changes attending felt EEG needed to be completed to rule out atypical seizure-like activity causing behavioral manifestations. ?-EEG which was done was unremarkable other than moderate diffuse encephalopathy  with no seizure activity noted. ?-Patient seen by neurology. ?-Continue current regimen of Keppra ?-We will need outpatient follow-up with neurology. ? ?5.  Acute kidney injury with  rhabdomyolysis ?-Resolved. ?-Renal ultrasound done negative for hydronephrosis. ?-Creatinine peaked at 7.48 on 01/11/2022. ?-Patient was seen in consultation by nephrology initial plans was to initiate dialysis including placement of [facility] but renal function gradually improved and dialysis was not needed and TDC discontinued. ?-Creatinine currently at 0.61(04/28/2021). ?-Supportive care. ? ?6.  Eczema ?-Continue current cream. ? ?7.  Cellulitis/questionable early abscess in the right neck ?-Improved on Keflex. ? ?8.  Possible Morton's neuroma ?-Patient with some complaints of numbness and tingling with weightbearing on the left great toe as well as adjacent 2 toes. ?-Continue Neurontin, oxycodone, Motrin. ? ?9.  Constipation ?-Bowel regimen as needed. ? ?10.  Right ventricular dysfunction ?Repeat 2D echo 01/18/2021 with a EF of 60 to 65% and improvement with right ventricular dysfunction noted as normal.  Cannot rule out small PFO but finding determined to be clinically in the significant given patient was asymptomatic. ? ?11.  Transaminitis at time of presentation ?-Felt likely secondary to sepsis physiology. ?-AST ALT noted to be significantly elevated over 700s on admission. ?-Felt could have been secondary to Zyprexa and Depakote. ?-LFTs trended down with decreasing Zyprexa dose. ? ?12.  Physical deconditioning secondary to ambulatory dysfunction ?-Improved. ?-Patient ambulating without difficulty. ? ?13.  Left ankle pain ?-Received prednisone and colchicine for possible gout. ? ?14.  Protein calorie malnutrition ?-Patient able to feed himself independently and eating well. ?- ? ?DVT prophylaxis: Ambulation ?Code Status: Full ?Family Communication: Updated patient.  No family at bedside. ?Disposition:  ? ?Status is: Inpatient ? ?Remains inpatient appropriate because: Unsafe disposition secondary to severe cognitive impairment requiring SNF versus ALF placement, requires a locked unit due to wandering behavior.   Currently lacking funding. ? ? ?  ?Consultants:  ?Neurology: Dr. Amada Jupiter 01/05/2021 ?Infectious disease: Dr.Vu 01/06/2021 ?Nephrology: Dr. Malen Gauze 01/08/2021 ?Orthopedics: Earney [place], [place] 01/20/2021 ?Psychiatry: Dr. Lucianne Muss 01/29/2021 ? ?Procedures:  ?EEG ?2D echo 01/06/2021, 01/18/2021 ?Transcranial Dopplers ?MRI brain 01/06/2021 ?MRI left ankle 01/18/2021 ?MRI L-spine 01/20/2021 ?Renal ultrasound 01/06/2021 ?Upper extremity Dopplers 01/16/2021 ?Lower extremity Dopplers 04/24/2021 ?Lumbar puncture 01/06/2021 ? ?Antimicrobials:  ?Anti-infectives (From admission, onward)  ? ? Start     Dose/Rate Route Frequency Ordered Stop  ? 04/19/21 1400  cephALEXin (KEFLEX) capsule 500 mg       ? 500 mg Oral Every 8 hours 04/19/21 1156 04/26/21 0625  ? 01/07/21 1500  vancomycin (VANCOREADY) IVPB 750 mg/150 mL  Status:  Discontinued       ? 750 mg ?150 mL/hr over 60 Minutes Intravenous Every 24 hours 01/06/21 1326 01/07/21 0802  ? 01/07/21 0802  vancomycin variable dose per unstable renal function (pharmacist dosing)  Status:  Discontinued       ?  Does not apply See admin instructions 01/07/21 0802 01/07/21 1000  ? 01/06/21 2200  cefTRIAXone (ROCEPHIN) 2 g in sodium chloride 0.9 % 100 mL IVPB  Status:  Discontinued       ? 2 g ?200 mL/hr over 30 Minutes Intravenous Every 12 hours 01/06/21 1635 01/07/21 1000  ? 01/06/21 1600  ceFEPIme (MAXIPIME) 2 g in sodium chloride 0.9 % 100 mL IVPB  Status:  Discontinued       ? 2 g ?200 mL/hr over 30 Minutes Intravenous Every 24 hours 01/05/21 1612 01/05/21 1721  ? 01/06/21 1415  vancomycin (VANCOREADY) IVPB 1500 mg/300 mL       ?  1,500 mg ?150 mL/hr over 120 Minutes Intravenous  Once 01/06/21 1326 01/06/21 1931  ? 01/05/21 1845  Ampicillin-Sulbactam (UNASYN) 3 g in sodium chloride 0.9 % 100 mL IVPB  Status:  Discontinued       ? 3 g ?200 mL/hr over 30 Minutes Intravenous Every 12 hours 01/05/21 1754 01/06/21 1635  ? 01/05/21 1645  vancomycin (VANCOCIN) IVPB 1000 mg/200 mL premix       ? 1,000  mg ?200 mL/hr over 60 Minutes Intravenous  Once 01/05/21 1513 01/05/21 1752  ? 01/05/21 1612  vancomycin variable dose per unstable renal function (pharmacist dosing)  Status:  Discontinued       ?  Does not apply Se

## 2021-04-28 NOTE — Plan of Care (Signed)
  Problem: Clinical Measurements: Goal: Cardiovascular complication will be avoided Outcome: Progressing   Problem: Activity: Goal: Risk for activity intolerance will decrease Outcome: Progressing   Problem: Coping: Goal: Level of anxiety will decrease Outcome: Progressing   Problem: Pain Managment: Goal: General experience of comfort will improve Outcome: Progressing   Problem: Safety: Goal: Ability to remain free from injury will improve Outcome: Progressing   

## 2021-04-28 NOTE — Progress Notes (Signed)
?Progress Note ?  ?  ?PatientIsahi [name] ZMO:[id number] DOB: 10-Feb-1965 DOA: 01/05/2021     101 ?DOS: the patient was seen and examined on 04/16/2021 ?  ?Brief hospital course: ?Steve [name] is a 56 y.o. male with history of major depression, anxiety who was in a residential ETOH treatment center for last 5 months. He was brought to the ED on 12/20 from a residential ETOH treatment center for convalescence, combativeness and altered sensorium.  Per collateral history, patient was not acting typical for last few days. ?  ?In the ED, patient was confused, combative. Labs showed AKI with creatinine of 3.27, rhabdomyolysis, abnormal LFTs, elevated WBC count at 23.7 and lactic acid level at 7.6. 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 Blood culture was sent, empiric antibiotic started.  IV fluid resuscitation started and patient was initially admitted to ICU. ?12/21, underwent LP which showed staph in CSF, felt to be contaminant.  EEG did not show any seizures ?12/24, HD catheter was placed but patient did not require HD as creatinine started to improve ?12/31, with clinical improvement, patient was transferred out of ICU to [facility]. ?1/18 Cognitive evaluations: 2 on the SLUMS, a 9 on the MMSE, and a 0 on medi-cog and short blessed test ?  ?Assessment and Plan: ?* Cognitive impairment  2/2 Wernicke's encephalopathy and alcoholic dementia ?Initial MRI revealed tiny acute infarct on the right but not significant enough to explain persistent cognitive and behavioral abnormalities. Also evidence of prior lacunar infarct right basal ganglia ?Cognitive screening revealed significant cognitive deficits; 4/5 repeat SLUMS has impoved to 24/30 but still c/w congnitive impsirments  ?Appreciate psychiatric team assistance.  Continue Risperdal, Prozac, trazodone, Depakote and melatonin.  ?On 4/12 patient  reporting gait imbalance and grogginess early a.m. as well as in the middle of the night to go void.  Melatonin dose at 10 mg.  Will decrease to 3 mg for the next 2 days then discontinue ?continue Zyprexa as needed for agitation ?Currently under IVC due to persistent wandering behaviors and threats to leave [place].   ?Patient's overall calmness and outlook have improved now that sitter is going off the unit with patient so he is not stuck in his room ?Continue sitter to allow patient to leave the room in the unit with supervision ? ?**Please note that sitter is being utilized in the acute hospital setting noting in the hospital setting we are unable to provide the same physical barriers as a locked unit and therefore for the patient's safety until patient is discharged he will need to require a sitter and a locked unit due to his recurrent wandering behaviors which increase at night. ?  ? After discharge recommendations: PLEASE NOTE THAT THIS PT'S REGIMEN OF PSYCHOTROPIC MEDS CAN NEVER BE WEANED AND DC'D DUE TO SEVERITY OF HIS [place] ? ?Swelling LLE ?Improved with more consistent elevation in leg and decreased sitting with leg dependent.  Also improved with increased walking and other activity ? ?Witnessed seizure  ?No apparent prior history of seizure disorder noting patient was not on AEDs prior to admission ?Continue Keppra at recommendation of neurology ?  ?Acute kidney injury with rhabdomyolysis ?Resolved ?Creatinine peaked at 7.48 on 12/26.  Nephrology consulted with initial plans to initiate dialysis including placement of [facility] but renal function gradually improved and dialysis not needed and TDC removed ?  ?Eczema ?Lesions initially resolved after  short course of topical cortisone but have reemerged.  On 4/5 we will resume topical cortisone to all areas on extremities abdomen and back. ? ?Possible abscess anterior neck ?Has completed 7 days of Keflex ? ?Possible Morton's  neuroma ?Complaining of numbness and tingling with weightbearing on left great toe as well as the adjacent 2 toes ?Continue Neurontin, short-term or Oxy IR ?Has completed 7 days of scheduled ibuprofen ? ?Constipation ?Resolved ? ?Possible DVT (deep venous thrombosis)  ?Per critical care note, hemodialysis cath had a clot on it when it was removed.  UE duplex  12/31: no DVT.   ?  ?Right ventricular dysfunction ?Echo 1/2 with EF 60 to 65%.  Could not rule out a small PFO but this finding determined to be clinically insignificant given the patient was asymptomatic ?  ?Elevated transaminases at time of presentation ?Likely secondary to sepsis like physiology at presentation ?His AST and ALT were significantly elevated to over 700s on admission.  ?Most recent demonstrate only slight elevation in ALT which has remained stable ?Due to oversedation Zyprexa dose decreased ?LFTs have normalized with decrease in Zyprexa dose. ?2/16 valproic acid level 55 ?  ?  ?Physical deconditioning 2/2 ambulatory dysfunction ?Patient now ambulating without difficulty ?A physical therapy consult is indicated based on the patient?s mobility assessment. ?  ?  ?Mobility Assessment (last 72 hours)   ?  ?  Mobility Assessment   ?  ?  [place] Name 03/15/21 1700 03/15/21 1200 03/14/21 1200 03/14/21 0952 03/14/21 0830  ?  Does patient have an order for bedrest or is patient medically unstable -- No - Continue assessment -- No - Continue assessment No - Continue assessment  ?  What is the highest level of mobility based on the progressive mobility assessment? Level 4 (Walks with assist in room) - Balance while marching in place and cannot step forward and back - Complete Level 4 (Walks with assist in room) - Balance while marching in place and cannot step forward and back - Complete -- Level 4 (Walks with assist in room) - Balance while marching in place and cannot step forward and back - Complete Level 4 (Walks with assist in room) - Balance while  marching in place and cannot step forward and back - Complete  ?  Is the above level different from baseline mobility prior to current illness? -- Yes - Recommend PT order -- Yes - Recommend PT order Yes - Recommend PT order  ?  ?  [place] Name 03/13/21 2034 03/13/21 0800   ?    ?    ?   ?  Does patient have an order for bedrest or is patient medically unstable No - Continue assessment No - Continue assessment        ?  What is the highest level of mobility based on the progressive mobility assessment? Level 4 (Walks with assist in room) - Balance while marching in place and cannot step forward and back - Complete Level 2 ([place]) - Balance while sitting on edge of bed and cannot stand        ?  Is the above level different from baseline mobility prior to current illness? Yes - Recommend PT order Yes - Recommend PT order        ?  ?   ?  ?  ?   ?  ?  ?  ?  ?Severe sepsis without septic shock  ?Ruled out ?Sepsis-like physiology secondary to presentation with profound dehydration, hypoperfusion  from dehydration related hypotension seizure activity ?Chest work-up was negative ?  ?Skin abnormalities/perineal MASD ?Resolved ?  ?Protein calorie malnutrition ([place]) ?Patient able to feed self independently and is eating well ?  ?Left ankle pain ?Again x-ray unremarkable ?Ankle and foot somewhat reddened and swollen and quite tender.  Pulse palpable. ?Suspect may be experiencing acute gout flare.  [place] and ESR are normal.  CRP only mildly elevated ?Has completed a 5-day course of prednisone.  On 4/3 will discontinue colcichine ? ?  ?  ?  ?Subjective:  ?Complaining of gait imbalance and difficulty mobilizing to bathroom during the middle of the night due to grogginess perceived secondary to medications.  Discussed that patient on both trazodone and melatonin.  Told patient I suspect melatonin is the culprit.  Agreed to wean and discontinue this medication ? ?Physical Exam: ?      ?Vitals:  ?  04/15/21 2336 04/16/21 0500  04/16/21 0751 04/16/21 0800  ?BP: 100/64 105/66 97/67    ?Pulse: 83 87 70    ?Resp: 16 18 19     ?Temp: 98.2 ?F (36.8 ?C) 97.9 ?F (36.6 ?C) (!) 97.4 ?F (36.3 ?C) 97.8 ?F (36.6 ?C)  ?TempSrc: Oral Oral Oral    ?SpO2: 98% 97% 10

## 2021-04-29 NOTE — Progress Notes (Addendum)
Speech Language Pathology Treatment: Cognitive-Linquistic  ?Patient Details ?Name: Steve [name] ?MRN: [id number] ?DOB: 07-03-1965 ?Today's Date: 04/29/2021 ?Time: 0211-1552 ?SLP Time Calculation (min) (ACUTE ONLY): 14 min ? ?Assessment / Plan / Recommendation ?Clinical Impression ? Pt was seen for cognitive-linguistic treatment. He was at the front desk and had paperwork which he reported was to file for disability. Pt was amenable to returning to his room for treatment. He was able to selectively attend to conversation and tasks despite the television playing throughout the session. He demonstrated 100% accuracy with money calculations during functional self-directed situations without prompts/cues. Pt was able to accurately recall conversations from earlier today. Pt's cognition is more functional, and is notably improved compared to when he was initially seen by this SLP in January, and pt expressed that he believes his cognition is back to baseline. 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 Further acute skilled SLP services are not clinically indicated at this time. ?  ?HPI HPI: Pt is a 56 y.o. male admitted from group home (substance abuse rehab?) on 01/05/21 with witnessed seizure, AMS. 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 EEG unremarkable. LP on 12/21 showed staph in CSF, felt to be contaminant. Workup for AKI, rhabdomyolysis. Dx Uremic encephalopathy. Course complicated by c/o L foot pain; imaging negative for acute injury. Pt remains hospitalized due to difficulty with placement. SLP evaluation on 01/26/21: 9/27 on [place] Exam, SLUMS 02/03/21: 2/30; SLUMS 1/27: 1/30. Psych consulted and as of 1/16, pt did not meet criteria for inpatient psychiatric  admission. ?  ?   ?SLP Plan ? All goals met;Discharge SLP treatment due to (comment) ? ?  ?  ?Recommendations for follow up therapy are one component of a multi-disciplinary discharge planning process, led by the attending physician.  Recommendations may be updated based on patient status, additional functional criteria and insurance authorization. ?  ? ?Recommendations  ?   ?   ?    ?   ? ? ? ? Oral Care Recommendations: Oral care BID ?Follow Up Recommendations: Skilled nursing-short term rehab (<3 hours/day) ?Assistance recommended at discharge: Frequent or constant Supervision/Assistance ?SLP Visit Diagnosis: Cognitive communication deficit (R41.841) ?Plan: All goals met;Discharge SLP treatment due to (comment) ? ? ? ? ?  ?  ?Emree Locicero I. Hardin Negus, [place], CCC-SLP ?Acute Rehabilitation Services ?Office number [phone] ?Pager [phone] ? ? ?Steve [name] ? ?04/29/2021, 3:43 PM ? ? ? ?

## 2021-04-29 NOTE — Plan of Care (Signed)
  Problem: Coping: Goal: Level of anxiety will decrease Outcome: Progressing   Problem: Pain Managment: Goal: General experience of comfort will improve Outcome: Progressing   Problem: Safety: Goal: Ability to remain free from injury will improve Outcome: Progressing   

## 2021-04-29 NOTE — TOC Progression Note (Addendum)
Transition of Care (TOC) - Progression Note  ? ? ?Patient Details  ?Name: Ival Record ?MRN: [id number] ?Date of Birth: September 01, 1965 ? ?Transition of Care (TOC) CM/SW Contact  ?Ashyla Luth Aris Lot, LCSW ?Phone Number: ?04/29/2021, 10:23 AM ? ?Clinical Narrative:    ? ?CSW contacted [facility] and [facility] to inquire about referral; awaiting response.  ? ?1400: [facility] responded that they can not offer bed for pt.  ? ? ?Expected Discharge Plan: Skilled Nursing Facility ?Barriers to Discharge: Continued Medical Work up, Homeless with medical needs, Inadequate or no insurance ? ?Expected Discharge Plan and Services ?Expected Discharge Plan: Skilled Nursing Facility ?In-house Referral: PCP / [facility] ?Discharge Planning Services: CM Consult, MATCH Program, Medication Assistance, Follow-up appt scheduled, Indigent Health Clinic ?  ?Living arrangements for the past 2 months: Group Home (Patient was resident at [facility] [place] prior to hospital admission.) ?Expected Discharge Date: 01/24/21               ?  ?  ?  ?  ?  ?  ?  ?  ?  ?  ? ? ?Social Determinants of Health (SDOH) Interventions ?  ? ?Readmission Risk Interventions ?   ? View : No data to display.  ?  ?  ?  ? ? ?

## 2021-04-29 NOTE — Progress Notes (Signed)
?Progress Note ?  ?  ?PatientTomoki [name] KDT:[id number] DOB: 1965-12-12 DOA: 01/05/2021     101 ?DOS: the patient was seen and examined on 04/16/2021 ?  ?Brief hospital course: ?Steve [name] is a 56 y.o. male with history of major depression, anxiety who was in a residential ETOH treatment center for last 5 months. He was brought to the ED on 12/20 from a residential ETOH treatment center for convalescence, combativeness and altered sensorium.  Per collateral history, patient was not acting typical for last few days. ?  ?In the ED, patient was confused, combative. Labs showed AKI with creatinine of 3.27, rhabdomyolysis, abnormal LFTs, elevated WBC count at 23.7 and lactic acid level at 7.6. 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 Blood culture was sent, empiric antibiotic started.  IV fluid resuscitation started and patient was initially admitted to ICU. ?12/21, underwent LP which showed staph in CSF, felt to be contaminant.  EEG did not show any seizures ?12/24, HD catheter was placed but patient did not require HD as creatinine started to improve ?12/31, with clinical improvement, patient was transferred out of ICU to [facility]. ?1/18 Cognitive evaluations: 2 on the SLUMS, a 9 on the MMSE, and a 0 on medi-cog and short blessed test ?  ?Assessment and Plan: ?* Cognitive impairment  2/2 Wernicke's encephalopathy and alcoholic dementia ?Initial MRI revealed tiny acute infarct on the right but not significant enough to explain persistent cognitive and behavioral abnormalities. Also evidence of prior lacunar infarct right basal ganglia ?Cognitive screening revealed significant cognitive deficits; 4/5 repeat SLUMS has impoved to 24/30 but still c/w congnitive impsirments  ?Appreciate psychiatric team assistance.  Continue Risperdal, Prozac, trazodone, Depakote and melatonin.  ?On 4/12 patient  reporting gait imbalance and grogginess early a.m. as well as in the middle of the night to go void.  Melatonin dose at 10 mg.  Will decrease to 3 mg for the next 2 days then discontinue ?continue Zyprexa as needed for agitation ?Currently under IVC due to persistent wandering behaviors and threats to leave [place].   ?Patient's overall calmness and outlook have improved now that sitter is going off the unit with patient so he is not stuck in his room ?Continue sitter to allow patient to leave the room in the unit with supervision ? ?**Please note that sitter is being utilized in the acute hospital setting noting in the hospital setting we are unable to provide the same physical barriers as a locked unit and therefore for the patient's safety until patient is discharged he will need to require a sitter and a locked unit due to his recurrent wandering behaviors which increase at night. ?  ? After discharge recommendations: PLEASE NOTE THAT THIS PT'S REGIMEN OF PSYCHOTROPIC MEDS CAN NEVER BE WEANED AND DC'D DUE TO SEVERITY OF HIS [place] ? ?Swelling LLE ?Improved with more consistent elevation in leg and decreased sitting with leg dependent.  Also improved with increased walking and other activity ? ?Witnessed seizure  ?No apparent prior history of seizure disorder noting patient was not on AEDs prior to admission ?Continue Keppra at recommendation of neurology ?  ?Acute kidney injury with rhabdomyolysis ?Resolved ?Creatinine peaked at 7.48 on 12/26.  Nephrology consulted with initial plans to initiate dialysis including placement of [facility] but renal function gradually improved and dialysis not needed and TDC removed ?  ?Eczema ?Lesions initially resolved after  short course of topical cortisone but have reemerged.  On 4/5 we will resume topical cortisone to all areas on extremities abdomen and back. ? ?Possible abscess anterior neck ?Has completed 7 days of Keflex ? ?Possible Morton's  neuroma ?Complaining of numbness and tingling with weightbearing on left great toe as well as the adjacent 2 toes ?Continue Neurontin, short-term or Oxy IR ?Has completed 7 days of scheduled ibuprofen ? ?Constipation ?Resolved ? ?Possible DVT (deep venous thrombosis)  ?Per critical care note, hemodialysis cath had a clot on it when it was removed.  UE duplex  12/31: no DVT.   ?  ?Right ventricular dysfunction ?Echo 1/2 with EF 60 to 65%.  Could not rule out a small PFO but this finding determined to be clinically insignificant given the patient was asymptomatic ?  ?Elevated transaminases at time of presentation ?Likely secondary to sepsis like physiology at presentation ?His AST and ALT were significantly elevated to over 700s on admission.  ?Most recent demonstrate only slight elevation in ALT which has remained stable ?Due to oversedation Zyprexa dose decreased ?LFTs have normalized with decrease in Zyprexa dose. ?2/16 valproic acid level 55 ?  ?  ?Physical deconditioning 2/2 ambulatory dysfunction ?Patient now ambulating without difficulty ?A physical therapy consult is indicated based on the patient?s mobility assessment. ?  ?  ?Mobility Assessment (last 72 hours)   ?  ?  Mobility Assessment   ?  ?  [place] Name 03/15/21 1700 03/15/21 1200 03/14/21 1200 03/14/21 0952 03/14/21 0830  ?  Does patient have an order for bedrest or is patient medically unstable -- No - Continue assessment -- No - Continue assessment No - Continue assessment  ?  What is the highest level of mobility based on the progressive mobility assessment? Level 4 (Walks with assist in room) - Balance while marching in place and cannot step forward and back - Complete Level 4 (Walks with assist in room) - Balance while marching in place and cannot step forward and back - Complete -- Level 4 (Walks with assist in room) - Balance while marching in place and cannot step forward and back - Complete Level 4 (Walks with assist in room) - Balance while  marching in place and cannot step forward and back - Complete  ?  Is the above level different from baseline mobility prior to current illness? -- Yes - Recommend PT order -- Yes - Recommend PT order Yes - Recommend PT order  ?  ?  [place] Name 03/13/21 2034 03/13/21 0800   ?    ?    ?   ?  Does patient have an order for bedrest or is patient medically unstable No - Continue assessment No - Continue assessment        ?  What is the highest level of mobility based on the progressive mobility assessment? Level 4 (Walks with assist in room) - Balance while marching in place and cannot step forward and back - Complete Level 2 (Chairfast) - Balance while sitting on edge of bed and cannot stand        ?  Is the above level different from baseline mobility prior to current illness? Yes - Recommend PT order Yes - Recommend PT order        ?  ?   ?  ?  ?   ?  ?  ?  ?  ?Severe sepsis without septic shock  ?Ruled out ?Sepsis-like physiology secondary to presentation with profound dehydration, hypoperfusion  from dehydration related hypotension seizure activity ?Chest work-up was negative ?  ?Skin abnormalities/perineal MASD ?Resolved ?  ?Protein calorie malnutrition ([place]) ?Patient able to feed self independently and is eating well ?  ?Left ankle pain ?Again x-ray unremarkable ?Ankle and foot somewhat reddened and swollen and quite tender.  Pulse palpable. ?Suspect may be experiencing acute gout flare.  Gassett and ESR are normal.  CRP only mildly elevated ?Has completed a 5-day course of prednisone.  On 4/3 will discontinue colcichine ? ?  ?  ?  ?Subjective:  ?Sleeping ? ?Physical Exam: ?      ?Vitals:  ?  04/15/21 2336 04/16/21 0500 04/16/21 0751 04/16/21 0800  ?BP: 100/64 105/66 97/67    ?Pulse: 83 87 70    ?Resp: 16 18 19     ?Temp: 98.2 ?F (36.8 ?C) 97.9 ?F (36.6 ?C) (!) 97.4 ?F (36.3 ?C) 97.8 ?F (36.6 ?C)  ?TempSrc: Oral Oral Oral    ?SpO2: 98% 97% 100%    ?Weight:          ?Height:          ?  ?Constitutional:  ?Respiratory:  Stable on room air, anterior lung sounds clear to auscultation.  Normal respiratory effort ?Cardiovascular: Normotensive, S1-S2, no peripheral edema.  Regular pulse. ?Abdomen: no tenderness, no masses palpated. Bowel sound

## 2021-04-30 NOTE — Progress Notes (Addendum)
?Progress Note ?  ?  ?PatientOdell [name] BZJ:[id number] DOB: 1965/11/20 DOA: 01/05/2021     101 ?DOS: the patient was seen and examined on 04/16/2021 ?  ?Brief hospital course: ?Steve [name] is a 56 y.o. male with history of major depression, anxiety who was in a residential ETOH treatment center for last 5 months. He was brought to the ED on 12/20 from a residential ETOH treatment center for convalescence, combativeness and altered sensorium.  Per collateral history, patient was not acting typical for last few days. ?  ?In the ED, patient was confused, combative. Labs showed AKI with creatinine of 3.27, rhabdomyolysis, abnormal LFTs, elevated WBC count at 23.7 and lactic acid level at 7.6. 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 Blood culture was sent, empiric antibiotic started.  IV fluid resuscitation started and patient was initially admitted to ICU. ?12/21, underwent LP which showed staph in CSF, felt to be contaminant.  EEG did not show any seizures ?12/24, HD catheter was placed but patient did not require HD as creatinine started to improve ?12/31, with clinical improvement, patient was transferred out of ICU to [facility]. ?1/18 Cognitive evaluations: 2 on the SLUMS, a 9 on the MMSE, and a 0 on medi-cog and short blessed test ?  ?Assessment and Plan: ?* Cognitive impairment  2/2 Wernicke's encephalopathy and alcoholic dementia ?Initial MRI revealed tiny acute infarct on the right but not significant enough to explain persistent cognitive and behavioral abnormalities. Also evidence of prior lacunar infarct right basal ganglia ?Cognitive screening revealed significant cognitive deficits; 4/5 repeat SLUMS has impoved to 24/30 but still c/w congnitive impsirments  ?Appreciate psychiatric team assistance.  Continue Risperdal, Prozac, trazodone, Depakote and melatonin.  ?On 4/12 melatonin dose  decreased to 3 mg after patient reported gait imbalance during the night when he got up to void  ?Continue sitter to allow patient to leave the room in the unit with supervision ? ?**Please note that sitter is being utilized in the acute hospital setting noting in the hospital setting we are unable to provide the same physical barriers as a locked unit and therefore for the patient's safety until patient is discharged he will need to require a sitter and a locked unit due to his recurrent wandering behaviors which increase at night. ?  ? After discharge recommendations: PLEASE NOTE THAT THIS PT'S REGIMEN OF PSYCHOTROPIC MEDS CAN NEVER BE WEANED AND DC'D DUE TO SEVERITY OF HIS [place] ? ?Swelling LLE ?Improved with more consistent elevation in leg and decreased sitting with leg dependent.  Also improved with increased walking and other activity ? ?Witnessed seizure  ?No apparent prior history of seizure disorder noting patient was not on AEDs prior to admission ?Continue Keppra at recommendation of neurology ?  ?Acute kidney injury with rhabdomyolysis ?Resolved ?Creatinine peaked at 7.48 on 12/26.  Nephrology consulted with initial plans to initiate dialysis including placement of [facility] but renal function gradually improved and dialysis not needed and TDC removed ?  ?Eczema ?Lesions initially resolved after short course of topical cortisone but have reemerged.  On 4/5 we will resume topical cortisone to all areas on extremities abdomen and back. ? ?Possible abscess anterior neck ?Has completed 7 days of Keflex ? ?Possible Morton's neuroma ?Complaining of numbness and tingling with weightbearing on left great toe as well as the adjacent 2 toes ?Continue Neurontin, short-term or Oxy IR ?Has completed 7  days of scheduled ibuprofen ? ?Constipation ?Resolved ? ?Possible DVT (deep venous thrombosis)  ?Per critical care note, hemodialysis cath had a clot on it when it was removed.  UE duplex  12/31: no DVT.   ?   ?Right ventricular dysfunction ?Echo 1/2 with EF 60 to 65%.  Could not rule out a small PFO but this finding determined to be clinically insignificant given the patient was asymptomatic ?  ?Elevated transaminases at time of presentation ?Likely secondary to sepsis like physiology at presentation ?His AST and ALT were significantly elevated to over 700s on admission.  ?Most recent demonstrate only slight elevation in ALT which has remained stable ?Due to oversedation Zyprexa dose decreased ?LFTs have normalized with decrease in Zyprexa dose. ?2/16 valproic acid level 55 ?  ?  ?Physical deconditioning 2/2 ambulatory dysfunction ?Patient now ambulating without difficulty ?A physical therapy consult is indicated based on the patient?s mobility assessment. ?  ?  ?Mobility Assessment (last 72 hours)   ?  ?  Mobility Assessment   ?  ?  [place] Name 03/15/21 1700 03/15/21 1200 03/14/21 1200 03/14/21 0952 03/14/21 0830  ?  Does patient have an order for bedrest or is patient medically unstable -- No - Continue assessment -- No - Continue assessment No - Continue assessment  ?  What is the highest level of mobility based on the progressive mobility assessment? Level 4 (Walks with assist in room) - Balance while marching in place and cannot step forward and back - Complete Level 4 (Walks with assist in room) - Balance while marching in place and cannot step forward and back - Complete -- Level 4 (Walks with assist in room) - Balance while marching in place and cannot step forward and back - Complete Level 4 (Walks with assist in room) - Balance while marching in place and cannot step forward and back - Complete  ?  Is the above level different from baseline mobility prior to current illness? -- Yes - Recommend PT order -- Yes - Recommend PT order Yes - Recommend PT order  ?  ?  [place]egin[place] Name 03/13/21 2034 03/13/21 0800   ?    ?    ?   ?  Does patient have an order for bedrest or is patient medically unstable No - Continue assessment  No - Continue assessment        ?  What is the highest level of mobility based on the progressive mobility assessment? Level 4 (Walks with assist in room) - Balance while marching in place and cannot step forward and back - Complete Level 2 (Chairfast) - Balance while sitting on edge of bed and cannot stand        ?  Is the above level different from baseline mobility prior to current illness? Yes - Recommend PT order Yes - Recommend PT order        ?  ?   ?  ?  ?   ?  ?  ?  ?  ?Severe sepsis without septic shock  ?Ruled out ?Sepsis-like physiology secondary to presentation with profound dehydration, hypoperfusion from dehydration related hypotension seizure activity ?Chest work-up was negative ?  ?Skin abnormalities/perineal MASD ?Resolved ?  ?Protein calorie malnutrition ([place]) ?Patient able to feed self independently and is eating well ?  ?Left ankle pain ?Again x-ray unremarkable ?Ankle and foot somewhat reddened and swollen and quite tender.  Pulse palpable. ?Suspect may be experiencing acute gout flare.  Gassett and ESR are normal.  CRP only mildly elevated ?Has completed a 5-day course of prednisone.  On 4/3 will discontinue colcichine ? ?  ?  ?  ?Subjective:  ?Sleeping soundly ? ?Physical Exam: ?      ?Vitals:  ?  04/15/21 2336 04/16/21 0500 04/16/21 0751 04/16/21 0800  ?BP: 100/64 105/66 97/67    ?Pulse: 83 87 70    ?Resp: 16 18 19     ?Temp: 98.2 ?F (36.8 ?C) 97.9 ?F (36.6 ?C) (!) 97.4 ?F (36.3 ?C) 97.8 ?F (36.6 ?C)  ?TempSrc: Oral Oral Oral    ?SpO2: 98% 97% 100%    ?Weight:          ?Height:          ?  ?Constitutional: Sleeping soundly ?Respiratory: Stable on room air, anterior lung sounds clear to auscultation.  Normal respiratory effort ?Cardiovascular: Normotensive, S1-S2, no peripheral edema.  Regular pulse. ?Abdomen: no tenderness, no masses palpated. Bowel sounds positive. LBM 4/11 ?Neurologic: Sleeping soundly but at baseline CN 2-12 grossly intact. Sensation intact, Strength 5/5 x all 4  extremities.  Able to ambulate independently without gait disturbance ?Psychiatric: Sleeping soundly but at baseline alert, oriented to person and place (has awareness he is not at home).  Not to time.  Remains co

## 2021-05-01 DIAGNOSIS — E512 Wernicke's encephalopathy: Secondary | ICD-10-CM | POA: Diagnosis not present

## 2021-05-01 DIAGNOSIS — N179 Acute kidney failure, unspecified: Secondary | ICD-10-CM | POA: Diagnosis not present

## 2021-05-01 DIAGNOSIS — R7401 Elevation of levels of liver transaminase levels: Secondary | ICD-10-CM

## 2021-05-01 DIAGNOSIS — K5900 Constipation, unspecified: Secondary | ICD-10-CM

## 2021-05-01 DIAGNOSIS — L308 Other specified dermatitis: Secondary | ICD-10-CM | POA: Diagnosis not present

## 2021-05-01 NOTE — Progress Notes (Signed)
?PROGRESS NOTE ? ? ? ?Steve [name]  XLK:[id number] DOB: [birth year] DOA: 01/05/2021 ?PCP: No primary care provider on file. ? ? ?Chief Complaint  ?Patient presents with  ? Altered Mental Status  ? Seizures  ? ? ?Brief Narrative:  ?Steve [name] is a 56 y.o. male with history of major depression, anxiety was brought in to the ED on 01/05/2022 from residential alcohol treatment center for combativeness, altered sensorium.   ?In the ED, patient was confused, combative. Labs showed AKI with creatinine of 3.27, rhabdomyolysis, abnormal LFTs, elevated WBC count at 23.7 and lactic acid level at 7.6. 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 Blood culture was sent, empiric antibiotic started.  IV fluid resuscitation started and patient was initially admitted to ICU. ?12/21, underwent LP which showed staph in CSF, felt to be contaminant.  EEG did not show any seizures ?12/24, HD catheter was placed but patient did not require HD as creatinine started to improve ?12/31, with clinical improvement, patient was transferred out of ICU to [facility]. ?1/18 Cognitive evaluations: 2 on the SLUMS, a 9 on the MMSE, and a 0 on medi-cog and short blessed test ? ? ?Assessment & Plan: ?  ?Principal Problem: ?  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  Skin abnormalities/perineal MASD ?  Physical deconditioning 2/2 ambulatory dysfunction ?  Right ventricular dysfunction ?  Witnessed seizure ([name]) ?  Eczema ?  Pressure injury of skin ?  Constipation ? ?#1 cognitive impairment secondary to Wernicke's encephalopathy and alcoholic dementia ?-Noted initial MRI revealed a tiny acute infarct in the right but not significant enough to explain patient's persistent cognitive and behavioral abnormalities.  Also noted was prior lacunar infarct right basal ganglia. ?-Cognitive screening  revealed significant cognitive deficits; 4/5 repeat SLUMS improved to 24/30 but still with cognitive impairments. ?-Patient seen in consultation by psychiatry and medications adjusted accordingly per psych recommendations. ?-Patient noted to have been under IVC due to persistent wandering behaviors and threats to leave AMA. ?-Plan was to allow IVC to expire but continue sitter. ?-It is noted that patient's overall calmness and outlook have improved now that he is allowed to go off the unit with a sitter so he is not stuck in the room. ?**Please note that sitter is being utilized in the acute hospital setting noting in the hospital setting we are unable to provide the same physical barriers as a locked unit and therefore for the patient's safety until patient is discharged he will need to require a sitter and a locked unit due to his recurrent wandering behaviors which increase at night. ? ?After discharge recommendations: PLEASE NOTE THAT THIS PT'S REGIMEN OF PSYCHOTROPIC MEDS CAN NEVER BE WEANED AND DC'D DUE TO SEVERITY OF HIS WERNICKE'S DEMENTIA ? ?2.  Left knee pain ?-Noted to be a new finding primarily over the patella noted with palpation. ?-Plain films done negative. ? ?3.  Left lower extremity swelling ?-Patient noted to have some chronic swelling and pain in the left foot and ankle extending to the calf. ?-Lower extremity Dopplers negative for DVT. ?-Plain films negative for any acute fracture. ?-Supportive care. ? ?4.  Witnessed seizure ?-Patient noted with prior history of seizures. ?-On 3/10 given recent behavior changes attending felt EEG needed to be completed to rule out atypical seizure-like activity causing behavioral manifestations. ?-EEG which was done was unremarkable other than moderate diffuse encephalopathy  with no seizure activity noted. ?-Patient seen by neurology. ?-Continue current regimen of Keppra ?-We will need outpatient follow-up with neurology. ? ?5.  Acute kidney injury with  rhabdomyolysis ?-Resolved. ?-Renal ultrasound done negative for hydronephrosis. ?-Creatinine peaked at 7.48 on 01/11/2022. ?-Patient was seen in consultation by nephrology initial plans was to initiate dialysis including placement of [facility] but renal function gradually improved and dialysis was not needed and TDC discontinued. ?-Creatinine currently at 0.61(04/28/2021). ?-Supportive care. ? ?6.  Eczema ?-Continue current cream. ? ?7.  Cellulitis/questionable early abscess in the right neck ?-Improved on Keflex. ? ?8.  Possible Morton's neuroma ?-Patient with some complaints of numbness and tingling with weightbearing on the left great toe as well as adjacent 2 toes. ?-Continue Neurontin, oxycodone, Motrin. ? ?9.  Constipation ?-Bowel regimen as needed. ? ?10.  Right ventricular dysfunction ?Repeat 2D echo 01/18/2021 with a EF of 60 to 65% and improvement with right ventricular dysfunction noted as normal.  Cannot rule out small PFO but finding determined to be clinically in the significant given patient was asymptomatic. ? ?11.  Transaminitis at time of presentation ?-Felt likely secondary to sepsis physiology. ?-AST ALT noted to be significantly elevated over 700s on admission. ?-Felt could have been secondary to Zyprexa and Depakote. ?-LFTs trended down with decreasing Zyprexa dose. ? ?12.  Physical deconditioning secondary to ambulatory dysfunction ?-Improved. ?-Patient ambulating without difficulty. ? ?13.  Left ankle pain ?-Received prednisone and colchicine for possible gout. ?-Improved. ? ?14.  Protein calorie malnutrition ?-Patient able to feed himself independently and eating well. ?- ? ?DVT prophylaxis: Ambulation ?Code Status: Full ?Family Communication: Updated patient.  No family at bedside. ?Disposition:  ? ?Status is: Inpatient ? ?Remains inpatient appropriate because: Unsafe disposition secondary to severe cognitive impairment requiring SNF versus ALF placement, requires a locked unit due to wandering  behavior.  Currently lacking funding. ? ? ?  ?Consultants:  ?Neurology: Dr. Amada Jupiter 01/05/2021 ?Infectious disease: Dr.Vu 01/06/2021 ?Nephrology: Dr. Malen Gauze 01/08/2021 ?Orthopedics: Earney Hamburg, [place] 01/20/2021 ?Psychiatry: Dr. Lucianne Muss 01/29/2021 ? ?Procedures:  ?EEG ?2D echo 01/06/2021, 01/18/2021 ?Transcranial Dopplers ?MRI brain 01/06/2021 ?MRI left ankle 01/18/2021 ?MRI L-spine 01/20/2021 ?Renal ultrasound 01/06/2021 ?Upper extremity Dopplers 01/16/2021 ?Lower extremity Dopplers 04/24/2021 ?Lumbar puncture 01/06/2021 ? ?Antimicrobials:  ?Anti-infectives (From admission, onward)  ? ? Start     Dose/Rate Route Frequency Ordered Stop  ? 04/19/21 1400  cephALEXin (KEFLEX) capsule 500 mg       ? 500 mg Oral Every 8 hours 04/19/21 1156 04/26/21 0625  ? 01/07/21 1500  vancomycin (VANCOREADY) IVPB 750 mg/150 mL  Status:  Discontinued       ? 750 mg ?150 mL/hr over 60 Minutes Intravenous Every 24 hours 01/06/21 1326 01/07/21 0802  ? 01/07/21 0802  vancomycin variable dose per unstable renal function (pharmacist dosing)  Status:  Discontinued       ?  Does not apply See admin instructions 01/07/21 0802 01/07/21 1000  ? 01/06/21 2200  cefTRIAXone (ROCEPHIN) 2 g in sodium chloride 0.9 % 100 mL IVPB  Status:  Discontinued       ? 2 g ?200 mL/hr over 30 Minutes Intravenous Every 12 hours 01/06/21 1635 01/07/21 1000  ? 01/06/21 1600  ceFEPIme (MAXIPIME) 2 g in sodium chloride 0.9 % 100 mL IVPB  Status:  Discontinued       ? 2 g ?200 mL/hr over 30 Minutes Intravenous Every 24 hours 01/05/21 1612 01/05/21 1721  ? 01/06/21 1415  vancomycin (VANCOREADY) IVPB 1500 mg/300 mL       ?  1,500 mg ?150 mL/hr over 120 Minutes Intravenous  Once 01/06/21 1326 01/06/21 1931  ? 01/05/21 1845  Ampicillin-Sulbactam (UNASYN) 3 g in sodium chloride 0.9 % 100 mL IVPB  Status:  Discontinued       ? 3 g ?200 mL/hr over 30 Minutes Intravenous Every 12 hours 01/05/21 1754 01/06/21 1635  ? 01/05/21 1645  vancomycin (VANCOCIN) IVPB 1000 mg/200 mL premix       ?  1,000 mg ?200 mL/hr over 60 Minutes Intravenous  Once 01/05/21 1513 01/05/21 1752  ? 01/05/21 1612  vancomycin variable dose per unstable renal function (pharmacist dosing)  Status:  Discontinued       ?  Does

## 2021-05-01 NOTE — Progress Notes (Signed)
Had a brief moment of agitation earlier in the day where he packed all of his belongings and said he is getting out of here.  York Spaniel he was going to knock down anyone who got in his way.  It was hard to redirect him at first then after a couple of hours, did calm down.  Now is walking calmly in the hallway with the sitter.  No signs of distress at this time.   ?

## 2021-05-01 NOTE — Plan of Care (Signed)
?  Problem: Health Behavior/Discharge Planning: ?Goal: Ability to manage health-related needs will improve ?Outcome: Progressing ?  ?Problem: Clinical Measurements: ?Goal: Ability to maintain clinical measurements within normal limits will improve ?Outcome: Progressing ?Goal: Will remain free from infection ?Outcome: Progressing ?Goal: Diagnostic test results will improve ?Outcome: Progressing ?Goal: Respiratory complications will improve ?Outcome: Progressing ?Goal: Cardiovascular complication will be avoided ?Outcome: Progressing ?  ?Problem: Activity: ?Goal: Risk for activity intolerance will decrease ?Outcome: Progressing ?  ?Problem: Nutrition: ?Goal: Adequate nutrition will be maintained ?Outcome: Progressing ?  ?Problem: Coping: ?Goal: Level of anxiety will decrease ?Outcome: Progressing ?  ?Problem: Elimination: ?Goal: Will not experience complications related to bowel motility ?Outcome: Progressing ?Goal: Will not experience complications related to urinary retention ?Outcome: Progressing ?  ?Problem: Pain Managment: ?Goal: General experience of comfort will improve ?Outcome: Progressing ?  ?Problem: Safety: ?Goal: Ability to remain free from injury will improve ?Outcome: Progressing ?  ?Problem: Skin Integrity: ?Goal: Risk for impaired skin integrity will decrease ?Outcome: Progressing ?  ?Problem: Education: ?Goal: Expressions of having a comfortable level of knowledge regarding the disease process will increase ?Outcome: Progressing ?  ?Problem: Coping: ?Goal: Ability to adjust to condition or change in health will improve ?Outcome: Progressing ?Goal: Ability to identify appropriate support needs will improve ?Outcome: Progressing ?  ?Problem: Health Behavior/Discharge Planning: ?Goal: Compliance with prescribed medication regimen will improve ?Outcome: Progressing ?  ?Problem: Medication: ?Goal: Risk for medication side effects will decrease ?Outcome: Progressing ?  ?Problem: Clinical  Measurements: ?Goal: Complications related to the disease process, condition or treatment will be avoided or minimized ?Outcome: Progressing ?Goal: Diagnostic test results will improve ?Outcome: Progressing ?  ?Problem: Safety: ?Goal: Verbalization of understanding the information provided will improve ?Outcome: Progressing ?  ?Problem: Self-Concept: ?Goal: Level of anxiety will decrease ?Outcome: Progressing ?Goal: Ability to verbalize feelings about condition will improve ?Outcome: Progressing ?  ?

## 2021-05-02 DIAGNOSIS — K5900 Constipation, unspecified: Secondary | ICD-10-CM | POA: Diagnosis not present

## 2021-05-02 DIAGNOSIS — L308 Other specified dermatitis: Secondary | ICD-10-CM | POA: Diagnosis not present

## 2021-05-02 DIAGNOSIS — E512 Wernicke's encephalopathy: Secondary | ICD-10-CM | POA: Diagnosis not present

## 2021-05-02 DIAGNOSIS — N179 Acute kidney failure, unspecified: Secondary | ICD-10-CM | POA: Diagnosis not present

## 2021-05-02 NOTE — Progress Notes (Signed)
?PROGRESS NOTE ? ? ? ?Steve [name]  JIR:[id number] DOB: 1965-07-16 DOA: 01/05/2021 ?PCP: No primary care provider on file. ? ? ?Chief Complaint  ?Patient presents with  ? Altered Mental Status  ? Seizures  ? ? ?Brief Narrative:  ?Steve [name] is a 56 y.o. male with history of major depression, anxiety was brought in to the ED on 01/05/2022 from residential alcohol treatment center for combativeness, altered sensorium.   ?In the ED, patient was confused, combative. Labs showed AKI with creatinine of 3.27, rhabdomyolysis, abnormal LFTs, elevated WBC count at 23.7 and lactic acid level at 7.6. 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 Blood culture was sent, empiric antibiotic started.  IV fluid resuscitation started and patient was initially admitted to ICU. ?12/21, underwent LP which showed staph in CSF, felt to be contaminant.  EEG did not show any seizures ?12/24, HD catheter was placed but patient did not require HD as creatinine started to improve ?12/31, with clinical improvement, patient was transferred out of ICU to [facility]. ?1/18 Cognitive evaluations: 2 on the SLUMS, a 9 on the MMSE, and a 0 on medi-cog and short blessed test ? ? ?Assessment & Plan: ?  ?Principal Problem: ?  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  Skin abnormalities/perineal MASD ?  Physical deconditioning 2/2 ambulatory dysfunction ?  Right ventricular dysfunction ?  Witnessed seizure ([name]) ?  Eczema ?  Pressure injury of skin ?  Constipation ? ?#1 cognitive impairment secondary to Wernicke's encephalopathy and alcoholic dementia ?-Noted initial MRI revealed a tiny acute infarct in the right but not significant enough to explain patient's persistent cognitive and behavioral abnormalities.  Also noted was prior lacunar infarct right basal ganglia. ?-Cognitive screening  revealed significant cognitive deficits; 4/5 repeat SLUMS improved to 24/30 but still with cognitive impairments. ?-Patient seen in consultation by psychiatry and medications adjusted accordingly per psych recommendations. ?-Patient noted to have been under IVC due to persistent wandering behaviors and threats to leave AMA. ?-Plan was to allow IVC to expire but continue sitter. ?-It is noted that patient's overall calmness and outlook have improved now that he is allowed to go off the unit with a sitter so he is not stuck in the room. ?**Please note that sitter is being utilized in the acute hospital setting noting in the hospital setting we are unable to provide the same physical barriers as a locked unit and therefore for the patient's safety until patient is discharged he will need to require a sitter and a locked unit due to his recurrent wandering behaviors which increase at night. ? ?After discharge recommendations: PLEASE NOTE THAT THIS PT'S REGIMEN OF PSYCHOTROPIC MEDS CAN NEVER BE WEANED AND DC'D DUE TO SEVERITY OF HIS WERNICKE'S DEMENTIA ? ?2.  Left knee pain ?-Noted to be a new finding primarily over the patella noted with palpation. ?-Plain films done negative. ? ?3.  Left lower extremity swelling ?-Patient noted to have some chronic swelling and pain in the left foot and ankle extending to the calf. ?-Lower extremity Dopplers negative for DVT. ?-Swelling improving. ?-Plain films negative for any acute fracture. ?-Supportive care. ? ?4.  Witnessed seizure ?-Patient noted with prior history of seizures. ?-On 3/10 given recent behavior changes attending felt EEG needed to be completed to rule out atypical seizure-like activity causing behavioral manifestations. ?-EEG which was done was unremarkable other than moderate  diffuse encephalopathy with no seizure activity noted. ?-Patient seen by neurology. ?-Continue current regimen of Keppra ?-We will need outpatient follow-up with neurology. ? ?5.  Acute  kidney injury with rhabdomyolysis ?-Resolved. ?-Renal ultrasound done negative for hydronephrosis. ?-Creatinine peaked at 7.48 on 01/11/2022. ?-Patient was seen in consultation by nephrology initial [facility] but [facility] function gradually improved and dialysis was not needed and TDC discontinued. ?-Creatinine currently at 0.61(04/28/2021). ?-Supportive care. ? ?6.  Eczema ?-Continue current cream. ? ?7.  Cellulitis/questionable early abscess in the right neck ?-Improved on Keflex. ? ?8.  Possible Morton's neuroma ?-Patient with some complaints of numbness and tingling with weightbearing on the left great toe as well as adjacent 2 toes. ?-Continue Neurontin, oxycodone, Motrin. ? ?9.  Constipation ?-Resolved ?-Bowel regimen as needed. ? ?10.  Right ventricular dysfunction ?Repeat 2D echo 01/18/2021 with a EF of 60 to 65% and improvement with right ventricular dysfunction noted as normal.  Cannot rule out small PFO but finding determined to be clinically insignificant given patient was asymptomatic. ? ?11.  Transaminitis at time of presentation ?-Felt likely secondary to sepsis physiology. ?-AST ALT noted to be significantly elevated over 700s on admission. ?-Felt could have been secondary to Zyprexa and Depakote. ?-LFTs trended down with decreasing Zyprexa dose. ? ?12.  Physical deconditioning secondary to ambulatory dysfunction ?-Improved. ?-Patient ambulating without difficulty. ? ?13.  Left ankle pain ?-Received prednisone and colchicine for possible gout. ?-Improved. ?-No further work-up needed ? ?14.  Protein calorie malnutrition ?-Patient able to feed himself independently and eating well. ?- ? ?DVT prophylaxis: Ambulation ?Code Status: Full ?Family Communication: Updated patient.  No family at bedside. ?Disposition: Awaiting placement. ? ?Status is: Inpatient ? ?Remains inpatient appropriate because: Unsafe disposition secondary to severe cognitive impairment  requiring SNF versus ALF placement, requires a locked unit due to wandering behavior.  Currently lacking funding. ? ? ?  ?Consultants:  ?Neurology: Dr. Amada JupiterKirkpatrick 01/05/2021 ?Infectious disease: Dr.Vu 01/06/2021 ?Nephrology: Dr. Malen GauzeFoster 01/08/2021 ?Orthopedics: Earney HamburgMichael [place], [place] 01/20/2021 ?Psychiatry: Dr. Lucianne MussKumar 01/29/2021 ? ?Procedures:  ?EEG ?2D echo 01/06/2021, 01/18/2021 ?Transcranial Dopplers ?MRI brain 01/06/2021 ?MRI left ankle 01/18/2021 ?MRI L-spine 01/20/2021 ?Renal ultrasound 01/06/2021 ?Upper extremity Dopplers 01/16/2021 ?Lower extremity Dopplers 04/24/2021 ?Lumbar puncture 01/06/2021 ? ?Antimicrobials:  ?Anti-infectives (From admission, onward)  ? ? Start     Dose/Rate Route Frequency Ordered Stop  ? 04/19/21 1400  cephALEXin (KEFLEX) capsule 500 mg       ? 500 mg Oral Every 8 hours 04/19/21 1156 04/26/21 0625  ? 01/07/21 1500  vancomycin (VANCOREADY) IVPB 750 mg/150 mL  Status:  Discontinued       ? 750 mg ?150 mL/hr over 60 Minutes Intravenous Every 24 hours 01/06/21 1326 01/07/21 0802  ? 01/07/21 0802  vancomycin variable dose per unstable renal function (pharmacist dosing)  Status:  Discontinued       ?  Does not apply See admin instructions 01/07/21 0802 01/07/21 1000  ? 01/06/21 2200  cefTRIAXone (ROCEPHIN) 2 g in sodium chloride 0.9 % 100 mL IVPB  Status:  Discontinued       ? 2 g ?200 mL/hr over 30 Minutes Intravenous Every 12 hours 01/06/21 1635 01/07/21 1000  ? 01/06/21 1600  ceFEPIme (MAXIPIME) 2 g in sodium chloride 0.9 % 100 mL IVPB  Status:  Discontinued       ? 2 g ?200 mL/hr over 30 Minutes Intravenous Every 24 hours 01/05/21 1612 01/05/21 1721  ? 01/06/21 1415  vancomycin (VANCOREADY)  IVPB 1500 mg/300 mL       ? 1,500 mg ?150 mL/hr over 120 Minutes Intravenous  Once 01/06/21 1326 01/06/21 1931  ? 01/05/21 1845  Ampicillin-Sulbactam (UNASYN) 3 g in sodium chloride 0.9 % 100 mL IVPB  Status:  Discontinued       ? 3 g ?200 mL/hr over 30 Minutes Intravenous Every 12 hours 01/05/21 1754 01/06/21  1635  ? 01/05/21 1645  vancomycin (VANCOCIN) IVPB 1000 mg/200 mL premix       ? 1,000 mg ?200 mL/hr over 60 Minutes Intravenous  Once 01/05/21 1513 01/05/21 1752  ? 01/05/21 1612  vancomycin variable dose per unstab

## 2021-05-02 NOTE — Progress Notes (Signed)
No agitation noted this shift. Able to rest and sleep and just now woke up to take a shower. Safety attendant at bedside and able to assist patient. Patient will go back in bed after the shower. ?

## 2021-05-02 NOTE — Plan of Care (Signed)
  Problem: Coping: Goal: Level of anxiety will decrease Outcome: Progressing   Problem: Pain Managment: Goal: General experience of comfort will improve Outcome: Progressing   Problem: Safety: Goal: Ability to remain free from injury will improve Outcome: Progressing   Problem: Skin Integrity: Goal: Risk for impaired skin integrity will decrease Outcome: Progressing   

## 2021-05-03 DIAGNOSIS — E512 Wernicke's encephalopathy: Secondary | ICD-10-CM | POA: Diagnosis not present

## 2021-05-03 NOTE — Progress Notes (Incomplete)
?PROGRESS NOTE ? ? ? ?Steve [name]  XLK:[id number] DOB: [birth year] DOA: 01/05/2021 ?PCP: No primary care provider on file. ? ? ?Chief Complaint  ?Patient presents with  ? Altered Mental Status  ? Seizures  ? ? ?Brief Narrative:  ?Steve [name] is a 56 y.o. male with history of major depression, anxiety was brought in to the ED on 01/05/2022 from residential alcohol treatment center for combativeness, altered sensorium.   ?In the ED, patient was confused, combative. Labs showed AKI with creatinine of 3.27, rhabdomyolysis, abnormal LFTs, elevated WBC count at 23.7 and lactic acid level at 7.6. 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 Blood culture was sent, empiric antibiotic started.  IV fluid resuscitation started and patient was initially admitted to ICU. ?12/21, underwent LP which showed staph in CSF, felt to be contaminant.  EEG did not show any seizures ?12/24, HD catheter was placed but patient did not require HD as creatinine started to improve ?12/31, with clinical improvement, patient was transferred out of ICU to [facility]. ?1/18 Cognitive evaluations: 2 on the SLUMS, a 9 on the MMSE, and a 0 on medi-cog and short blessed test ? ? ?Assessment & Plan: ?  ?Principal Problem: ?  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  Skin abnormalities/perineal MASD ?  Physical deconditioning 2/2 ambulatory dysfunction ?  Right ventricular dysfunction ?  Witnessed seizure ([name]) ?  Eczema ?  Pressure injury of skin ?  Constipation ? ?#1 cognitive impairment secondary to Wernicke's encephalopathy and alcoholic dementia ?-Noted initial MRI revealed a tiny acute infarct in the right but not significant enough to explain patient's persistent cognitive and behavioral abnormalities.  Also noted was prior lacunar infarct right basal ganglia. ?-Cognitive screening  revealed significant cognitive deficits; 4/5 repeat SLUMS improved to 24/30 but still with cognitive impairments. ?-Patient seen in consultation by psychiatry and medications adjusted accordingly per psych recommendations. ?-Patient noted to have been under IVC due to persistent wandering behaviors and threats to leave AMA. ?-Plan was to allow IVC to expire but continue sitter. ?-It is noted that patient's overall calmness and outlook have improved now that he is allowed to go off the unit with a sitter so he is not stuck in the room. ?**Please note that sitter is being utilized in the acute hospital setting noting in the hospital setting we are unable to provide the same physical barriers as a locked unit and therefore for the patient's safety until patient is discharged he will need to require a sitter and a locked unit due to his recurrent wandering behaviors which increase at night. ? ?After discharge recommendations: PLEASE NOTE THAT THIS PT'S REGIMEN OF PSYCHOTROPIC MEDS CAN NEVER BE WEANED AND DC'D DUE TO SEVERITY OF HIS WERNICKE'S DEMENTIA ? ?2.  Left knee pain ?-Noted to be a new finding primarily over the patella noted with palpation. ?-Plain films done negative. ? ?3.  Left lower extremity swelling ?-Patient noted to have some chronic swelling and pain in the left foot and ankle extending to the calf. ?-Lower extremity Dopplers negative for DVT. ?-Plain films negative for any acute fracture. ?-Supportive care. ? ?4.  Witnessed seizure ?-Patient noted with prior history of seizures. ?-On 3/10 given recent behavior changes attending felt EEG needed to be completed to rule out atypical seizure-like activity causing behavioral manifestations. ?-EEG which was done was unremarkable other than moderate diffuse encephalopathy  with no seizure activity noted. ?-Patient seen by neurology. ?-Continue current regimen of Keppra ?-We will need outpatient follow-up with neurology. ? ?5.  Acute kidney injury with  rhabdomyolysis ?-Resolved. ?-Renal ultrasound done negative for hydronephrosis. ?-Creatinine peaked at 7.48 on 01/11/2022. ?-Patient was seen in consultation by nephrology initial plans was to initiate dialysis including placement of [facility] but renal function gradually improved and dialysis was not needed and TDC discontinued. ?-Creatinine currently at 0.61(04/28/2021). ?-Supportive care. ? ?6.  Eczema ?-Continue current cream. ? ?7.  Cellulitis/questionable early abscess in the right neck ?-Improved on Keflex. ? ?8.  Possible Morton's neuroma ?-Patient with some complaints of numbness and tingling with weightbearing on the left great toe as well as adjacent 2 toes. ?-Continue Neurontin, oxycodone, Motrin. ? ?9.  Constipation ?-Bowel regimen as needed. ? ?10.  Right ventricular dysfunction ?Repeat 2D echo 01/18/2021 with a EF of 60 to 65% and improvement with right ventricular dysfunction noted as normal.  Cannot rule out small PFO but finding determined to be clinically in the significant given patient was asymptomatic. ? ?11.  Transaminitis at time of presentation ?-Felt likely secondary to sepsis physiology. ?-AST ALT noted to be significantly elevated over 700s on admission. ?-Felt could have been secondary to Zyprexa and Depakote. ?-LFTs trended down with decreasing Zyprexa dose. ? ?12.  Physical deconditioning secondary to ambulatory dysfunction ?-Improved. ?-Patient ambulating without difficulty. ? ?13.  Left ankle pain ?-Received prednisone and colchicine for possible gout. ?-Improved. ? ?14.  Protein calorie malnutrition ?-Patient able to feed himself independently and eating well. ?- ? ?DVT prophylaxis: Ambulation ?Code Status: Full ?Family Communication: Updated patient.  No family at bedside. ?Disposition:  ? ?Status is: Inpatient ? ?Remains inpatient appropriate because: Unsafe disposition secondary to severe cognitive impairment requiring SNF versus ALF placement, requires a locked unit due to wandering  behavior.  Currently lacking funding. ? ? ?  ?Consultants:  ?Neurology: Dr. Amada Jupiter 01/05/2021 ?Infectious disease: Dr.Vu 01/06/2021 ?Nephrology: Dr. Malen Gauze 01/08/2021 ?Orthopedics: Earney Hamburg, [place] 01/20/2021 ?Psychiatry: Dr. Lucianne Muss 01/29/2021 ? ?Procedures:  ?EEG ?2D echo 01/06/2021, 01/18/2021 ?Transcranial Dopplers ?MRI brain 01/06/2021 ?MRI left ankle 01/18/2021 ?MRI L-spine 01/20/2021 ?Renal ultrasound 01/06/2021 ?Upper extremity Dopplers 01/16/2021 ?Lower extremity Dopplers 04/24/2021 ?Lumbar puncture 01/06/2021 ? ?Antimicrobials:  ?Anti-infectives (From admission, onward)  ? ? Start     Dose/Rate Route Frequency Ordered Stop  ? 04/19/21 1400  cephALEXin (KEFLEX) capsule 500 mg       ? 500 mg Oral Every 8 hours 04/19/21 1156 04/26/21 0625  ? 01/07/21 1500  vancomycin (VANCOREADY) IVPB 750 mg/150 mL  Status:  Discontinued       ? 750 mg ?150 mL/hr over 60 Minutes Intravenous Every 24 hours 01/06/21 1326 01/07/21 0802  ? 01/07/21 0802  vancomycin variable dose per unstable renal function (pharmacist dosing)  Status:  Discontinued       ?  Does not apply See admin instructions 01/07/21 0802 01/07/21 1000  ? 01/06/21 2200  cefTRIAXone (ROCEPHIN) 2 g in sodium chloride 0.9 % 100 mL IVPB  Status:  Discontinued       ? 2 g ?200 mL/hr over 30 Minutes Intravenous Every 12 hours 01/06/21 1635 01/07/21 1000  ? 01/06/21 1600  ceFEPIme (MAXIPIME) 2 g in sodium chloride 0.9 % 100 mL IVPB  Status:  Discontinued       ? 2 g ?200 mL/hr over 30 Minutes Intravenous Every 24 hours 01/05/21 1612 01/05/21 1721  ? 01/06/21 1415  vancomycin (VANCOREADY) IVPB 1500 mg/300 mL       ?  1,500 mg ?150 mL/hr over 120 Minutes Intravenous  Once 01/06/21 1326 01/06/21 1931  ? 01/05/21 1845  Ampicillin-Sulbactam (UNASYN) 3 g in sodium chloride 0.9 % 100 mL IVPB  Status:  Discontinued       ? 3 g ?200 mL/hr over 30 Minutes Intravenous Every 12 hours 01/05/21 1754 01/06/21 1635  ? 01/05/21 1645  vancomycin (VANCOCIN) IVPB 1000 mg/200 mL premix       ?  1,000 mg ?200 mL/hr over 60 Minutes Intravenous  Once 01/05/21 1513 01/05/21 1752  ? 01/05/21 1612  vancomycin variable dose per unstable renal function (pharmacist dosing)  Status:  Discontinued       ?  Does

## 2021-05-03 NOTE — Progress Notes (Signed)
CSW attempted to reach [place] [employer] without success - a voicemail was left requesting a return call. ? ?Edwin Dada, MSW, LCSW ?Transitions of Care  Clinical Social Worker II ?[phone] ? ?

## 2021-05-03 NOTE — Progress Notes (Signed)
?Progress Note ?  ?  ?PatientTerrian [name] SHF:[id number] DOB: 06/27/1965 DOA: 01/05/2021     101 ?DOS: the patient was seen and examined on 04/16/2021 ?  ?Brief hospital course: ?Steve [name] is a 56 y.o. male with history of major depression, anxiety who was in a residential ETOH treatment center for last 5 months. He was brought to the ED on 12/20 from a residential ETOH treatment center for convalescence, combativeness and altered sensorium.  Per collateral history, patient was not acting typical for last few days. ?  ?In the ED, patient was confused, combative. Labs showed AKI with creatinine of 3.27, rhabdomyolysis, abnormal LFTs, elevated WBC count at 23.7 and lactic acid level at 7.6. 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 Blood culture was sent, empiric antibiotic started.  IV fluid resuscitation started and patient was initially admitted to ICU. ?12/21, underwent LP which showed staph in CSF, felt to be contaminant.  EEG did not show any seizures ?12/24, HD catheter was placed but patient did not require HD as creatinine started to improve ?12/31, with clinical improvement, patient was transferred out of ICU to [facility]. ?1/18 Cognitive evaluations: 2 on the SLUMS, a 9 on the MMSE, and a 0 on medi-cog and short blessed test ?  ?Assessment and Plan: ?* Cognitive impairment  2/2 Wernicke's encephalopathy and alcoholic dementia ?Initial MRI revealed tiny acute infarct on the right but not significant enough to explain persistent cognitive and behavioral abnormalities. Also evidence of prior lacunar infarct right basal ganglia ?Cognitive screening revealed significant cognitive deficits; 4/5 repeat SLUMS has impoved to 24/30 but still c/w congnitive impsirments  ?Appreciate psychiatric team assistance.  Continue Risperdal, Prozac, trazodone, Depakote and melatonin.  ?Continue sitter to  allow patient to leave the room in the unit with supervision ? ?**Please note that sitter is being utilized in the acute hospital setting noting in the hospital setting we are unable to provide the same physical barriers as a locked unit and therefore for the patient's safety until patient is discharged he will need to require a sitter and a locked unit due to his recurrent wandering behaviors which increase at night. ?  ? After discharge recommendations: PLEASE NOTE THAT THIS PT'S REGIMEN OF PSYCHOTROPIC MEDS CAN NEVER BE WEANED AND DC'D DUE TO SEVERITY OF HIS [place] ? ?Swelling LLE ?Improved with more consistent elevation in leg and decreased sitting with leg dependent.  Also improved with increased walking and other activity ? ?Witnessed seizure  ?No apparent prior history of seizure disorder noting patient was not on AEDs prior to admission ?Continue Keppra at recommendation of neurology ?  ?Acute kidney injury with rhabdomyolysis ?Resolved ?Creatinine peaked at 7.48 on 12/26.  Nephrology consulted with initial plans to initiate dialysis including placement of [facility] but renal function gradually improved and dialysis not needed and TDC removed ?  ?Eczema ?Lesions initially resolved after short course of topical cortisone but have reemerged.  On 4/5 we will resume topical cortisone to all areas on extremities abdomen and back. ? ?Possible abscess anterior neck ?Has completed 7 days of Keflex ? ?Possible Morton's neuroma ?Complaining of numbness and tingling with weightbearing on left great toe as well as the adjacent 2 toes ?Continue Neurontin, short-term or Oxy IR ?Has completed 7 days of scheduled ibuprofen ? ?Constipation ?Resolved ? ?Possible DVT (deep venous thrombosis)  ?Per critical care note, hemodialysis cath had a clot  on it when it was removed.  UE duplex  12/31: no DVT.   ?  ?Right ventricular dysfunction ?Echo 1/2 with EF 60 to 65%.  Could not rule out a small PFO but this finding  determined to be clinically insignificant given the patient was asymptomatic ?  ?Elevated transaminases at time of presentation ?Likely secondary to sepsis like physiology at presentation ?His AST and ALT were significantly elevated to over 700s on admission.  ?Most recent demonstrate only slight elevation in ALT which has remained stable ?Due to oversedation Zyprexa dose decreased ?LFTs have normalized with decrease in Zyprexa dose. ?2/16 valproic acid level 55 ?  ?  ?Physical deconditioning 2/2 ambulatory dysfunction ?Patient now ambulating without difficulty ?A physical therapy consult is indicated based on the patient?s mobility assessment. ?  ?  ?Mobility Assessment (last 72 hours)   ?  ?  Mobility Assessment   ?  ?  [place] Name 03/15/21 1700 03/15/21 1200 03/14/21 1200 03/14/21 0952 03/14/21 0830  ?  Does patient have an order for bedrest or is patient medically unstable -- No - Continue assessment -- No - Continue assessment No - Continue assessment  ?  What is the highest level of mobility based on the progressive mobility assessment? Level 4 (Walks with assist in room) - Balance while marching in place and cannot step forward and back - Complete Level 4 (Walks with assist in room) - Balance while marching in place and cannot step forward and back - Complete -- Level 4 (Walks with assist in room) - Balance while marching in place and cannot step forward and back - Complete Level 4 (Walks with assist in room) - Balance while marching in place and cannot step forward and back - Complete  ?  Is the above level different from baseline mobility prior to current illness? -- Yes - Recommend PT order -- Yes - Recommend PT order Yes - Recommend PT order  ?  ?  [place] Name 03/13/21 2034 03/13/21 0800   ?    ?    ?   ?  Does patient have an order for bedrest or is patient medically unstable No - Continue assessment No - Continue assessment        ?  What is the highest level of mobility based on the progressive mobility  assessment? Level 4 (Walks with assist in room) - Balance while marching in place and cannot step forward and back - Complete Level 2 ([place]) - Balance while sitting on edge of bed and cannot stand        ?  Is the above level different from baseline mobility prior to current illness? Yes - Recommend PT order Yes - Recommend PT order        ?  ?   ?  ?  ?   ?  ?  ?  ?  ?Severe sepsis without septic shock  ?Ruled out ?Sepsis-like physiology secondary to presentation with profound dehydration, hypoperfusion from dehydration related hypotension seizure activity ?Chest work-up was negative ?  ?Skin abnormalities/perineal MASD ?Resolved ?  ?Protein calorie malnutrition ([place]) ?Patient able to feed self independently and is eating well ?  ?Left ankle pain ?Again x-ray unremarkable ?Ankle and foot somewhat reddened and swollen and quite tender.  Pulse palpable. ?Suspect may be experiencing acute gout flare.  [place] and ESR are normal.  CRP only mildly elevated ?Has completed a 5-day course of prednisone.  On 4/3 will discontinue colcichine ? ?  ?  ?  ?  Subjective:  ?Reports no further gait disturbance with walking after reduction and melatonin.  Seems to believe he is getting scheduled Ativan at bedtime which I told him I thought all of his Ativan had been discontinued.  When told that he is likely just on Klonopin he stated he did not want that to be discontinued.  I told him that was the only scheduled benzodiazepine he was getting.  Reports difficulty sleeping because he stated that the sitter at night place music on her cell phone that keeps him awake. ? ?Physical Exam: ?      ?Vitals:  ?  04/15/21 2336 04/16/21 0500 04/16/21 0751 04/16/21 0800  ?BP: 100/64 105/66 97/67    ?Pulse: 83 87 70    ?Resp: 16 18 19     ?Temp: 98.2 ?F (36.8 ?C) 97.9 ?F (36.6 ?C) (!) 97.4 ?F (36.3 ?C) 97.8 ?F (36.6 ?C)  ?TempSrc: Oral Oral Oral    ?SpO2: 98% 97% 100%    ?Weight:          ?Height:          ?  ?Constitutional: Calmly laying on  the bed.  No acute distress ?Respiratory: Stable on room air, anterior lung sounds clear to auscultation.  Normal respiratory effort ?Cardiovascular: Normotensive, S1-S2, no peripheral edema.  Regular pulse. ?Abdomen:

## 2021-05-04 MED ORDER — CLONAZEPAM 0.25 MG PO TBDP
1.0000 mg | ORAL_TABLET | Freq: Once | ORAL | Status: AC
  Administered 2021-05-04: 1 mg via ORAL
  Filled 2021-05-04: qty 4

## 2021-05-04 NOTE — Progress Notes (Addendum)
?Progress Note ?  ?  ?PatientIssiah [name] CBU:[id number] DOB: [birth year] DOA: 01/05/2021     101 ?DOS: the patient was seen and examined on 04/16/2021 ?  ?Brief hospital course: ?Steve [name] is a 56 y.o. male with history of major depression, anxiety who was in a residential ETOH treatment center for last 5 months. He was brought to the ED on 12/20 from a residential ETOH treatment center for convalescence, combativeness and altered sensorium.  Per collateral history, patient was not acting typical for last few days. ?  ?In the ED, patient was confused, combative. Labs showed AKI with creatinine of 3.27, rhabdomyolysis, abnormal LFTs, elevated WBC count at 23.7 and lactic acid level at 7.6. 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 Blood culture was sent, empiric antibiotic started.  IV fluid resuscitation started and patient was initially admitted to ICU. ?12/21, underwent LP which showed staph in CSF, felt to be contaminant.  EEG did not show any seizures ?12/24, HD catheter was placed but patient did not require HD as creatinine started to improve ?12/31, with clinical improvement, patient was transferred out of ICU to [facility]. ?1/18 Cognitive evaluations: 2 on the SLUMS, a 9 on the MMSE, and a 0 on medi-cog and short blessed test ?  ?Assessment and Plan: ?* Cognitive impairment  2/2 Wernicke's encephalopathy and alcoholic dementia ?Initial MRI revealed tiny acute infarct on the right but not significant enough to explain persistent cognitive and behavioral abnormalities. Also evidence of prior lacunar infarct right basal ganglia ?Cognitive screening revealed significant cognitive deficits; 4/5 repeat SLUMS has impoved to 24/30 but still c/w congnitive impsirments  ?Appreciate psychiatric team assistance.  Continue Risperdal, Prozac, trazodone, Depakote and melatonin.  ?Continue sitter to  allow patient to leave the room in the unit with supervision ? ?**4/18 in the afternoon patient became very agitated stating he needed a day pass to go visit his family..  I presented to the bedside and discussed his situation calmly with the patient explaining we do not give day passes and that he does not have any family at this time to take a day pass with.  Reminded him of his current cognitive issues and the fact we are trying to find him a place to live once we obtain disability and Medicaid.  At this point patient's primary complaint was to see if someone could wash his clothing because his clothing smells really bad.  Prior to leaving the room I noticed the patient was very shaky and asked him if he needed something for his nerves and he stated yes.  Klonopin 1 mg x 1 was ordered.  Patient's nurse was updated on need for urgent administration of Klonopin. ? ?**Please note that sitter is being utilized in the acute hospital setting noting in the hospital setting we are unable to provide the same physical barriers as a locked unit and therefore for the patient's safety until patient is discharged he will need to require a sitter and a locked unit due to his recurrent wandering behaviors which increase at night. ?  ? After discharge recommendations: PLEASE NOTE THAT THIS PT'S REGIMEN OF PSYCHOTROPIC MEDS CAN NEVER BE WEANED AND DC'D DUE TO SEVERITY OF HIS [name] ? ?Swelling LLE ?Improved with more consistent elevation in leg and decreased sitting with leg dependent.  Also improved with increased walking and other activity ? ?Witnessed seizure  ?No apparent prior  history of seizure disorder noting patient was not on AEDs prior to admission ?Continue Keppra at recommendation of neurology ?  ?Acute kidney injury with rhabdomyolysis ?Resolved ?Creatinine peaked at 7.48 on 12/26.  Nephrology consulted with initial plans to initiate dialysis including placement of [facility] but renal function gradually  improved and dialysis not needed and TDC removed ?  ?Eczema ?Lesions initially resolved after short course of topical cortisone but have reemerged.  On 4/5 we will resume topical cortisone to all areas on extremities abdomen and back. ? ?Possible abscess anterior neck ?Has completed 7 days of Keflex ? ?Possible Morton's neuroma ?Complaining of numbness and tingling with weightbearing on left great toe as well as the adjacent 2 toes ?Continue Neurontin, short-term or Oxy IR ?Has completed 7 days of scheduled ibuprofen ? ?Constipation ?Resolved ? ?Possible DVT (deep venous thrombosis)  ?Per critical care note, hemodialysis cath had a clot on it when it was removed.  UE duplex  12/31: no DVT.   ?  ?Right ventricular dysfunction ?Echo 1/2 with EF 60 to 65%.  Could not rule out a small PFO but this finding determined to be clinically insignificant given the patient was asymptomatic ?  ?Elevated transaminases at time of presentation ?Likely secondary to sepsis like physiology at presentation ?His AST and ALT were significantly elevated to over 700s on admission.  ?Most recent demonstrate only slight elevation in ALT which has remained stable ?Due to oversedation Zyprexa dose decreased ?LFTs have normalized with decrease in Zyprexa dose. ?2/16 valproic acid level 55 ?  ?  ?Physical deconditioning 2/2 ambulatory dysfunction ?Patient now ambulating without difficulty ?A physical therapy consult is indicated based on the patient?s mobility assessment. ?  ?  ?Mobility Assessment (last 72 hours)   ?  ?  Mobility Assessment   ?  ?  [place] Name 03/15/21 1700 03/15/21 1200 03/14/21 1200 03/14/21 0952 03/14/21 0830  ?  Does patient have an order for bedrest or is patient medically unstable -- No - Continue assessment -- No - Continue assessment No - Continue assessment  ?  What is the highest level of mobility based on the progressive mobility assessment? Level 4 (Walks with assist in room) - Balance while marching in place and cannot  step forward and back - Complete Level 4 (Walks with assist in room) - Balance while marching in place and cannot step forward and back - Complete -- Level 4 (Walks with assist in room) - Balance while marching in place and cannot step forward and back - Complete Level 4 (Walks with assist in room) - Balance while marching in place and cannot step forward and back - Complete  ?  Is the above level different from baseline mobility prior to current illness? -- Yes - Recommend PT order -- Yes - Recommend PT order Yes - Recommend PT order  ?  ?  [place] Name 03/13/21 2034 03/13/21 0800   ?    ?    ?   ?  Does patient have an order for bedrest or is patient medically unstable No - Continue assessment No - Continue assessment        ?  What is the highest level of mobility based on the progressive mobility assessment? Level 4 (Walks with assist in room) - Balance while marching in place and cannot step forward and back - Complete Level 2 (Chairfast) - Balance while sitting on edge of bed and cannot stand        ?  Is  the above level different from baseline mobility prior to current illness? Yes - Recommend PT order Yes - Recommend PT order        ?  ?   ?  ?  ?   ?  ?  ?  ?  ?Severe sepsis without septic shock  ?Ruled out ?Sepsis-like physiology secondary to presentation with profound dehydration, hypoperfusion from dehydration related hypotension seizure activity ?Chest work-up was negative ?  ?Skin abnormalities/perineal MASD ?Resolved ?  ?Protein calorie malnutrition ([place]) ?Patient able to feed self independently and is eating well ?  ?Left ankle pain ?Again x-ray unremarkable ?Ankle and foot somewhat reddened and swollen and quite tender.  Pulse palpable. ?Suspect may be experiencing acute gout flare.  Gassett and ESR are normal.  CRP only mildly elevated ?Has completed a 5-day course of prednisone.  On 4/3 will discontinue colcichine ? ?  ?  ?  ?Subjective:  ?Sleeping soundly and did not awaken ? ?Physical Exam: ?       ?Vitals:  ?  04/15/21 2336 04/16/21 0500 04/16/21 0751 04/16/21 0800  ?BP: 100/64 105/66 97/67    ?Pulse: 83 87 70    ?Resp: 16 18 19     ?Temp: 98.2 ?F (36.8 ?C) 97.9 ?F (36.6 ?C) (!) 97.4 ?F (36.3 ?C) 97.8 ?F (

## 2021-05-05 DIAGNOSIS — E512 Wernicke's encephalopathy: Secondary | ICD-10-CM | POA: Diagnosis not present

## 2021-05-05 DIAGNOSIS — R7401 Elevation of levels of liver transaminase levels: Secondary | ICD-10-CM | POA: Diagnosis not present

## 2021-05-05 DIAGNOSIS — L309 Dermatitis, unspecified: Secondary | ICD-10-CM | POA: Diagnosis not present

## 2021-05-05 DIAGNOSIS — N179 Acute kidney failure, unspecified: Secondary | ICD-10-CM | POA: Diagnosis not present

## 2021-05-05 LAB — CBC WITH DIFFERENTIAL/PLATELET
Abs Immature Granulocytes: 0.03 10*3/uL (ref 0.00–0.07)
Basophils Absolute: 0 10*3/uL (ref 0.0–0.1)
Basophils Relative: 1 %
Eosinophils Absolute: 0.2 10*3/uL (ref 0.0–0.5)
Eosinophils Relative: 4 %
HCT: 36.1 % — ABNORMAL LOW (ref 39.0–52.0)
Hemoglobin: 12.4 g/dL — ABNORMAL LOW (ref 13.0–17.0)
Immature Granulocytes: 1 %
Lymphocytes Relative: 40 %
Lymphs Abs: 2.2 10*3/uL (ref 0.7–4.0)
MCH: 34 pg (ref 26.0–34.0)
MCHC: 34.3 g/dL (ref 30.0–36.0)
MCV: 98.9 fL (ref 80.0–100.0)
Monocytes Absolute: 0.7 10*3/uL (ref 0.1–1.0)
Monocytes Relative: 13 %
Neutro Abs: 2.3 10*3/uL (ref 1.7–7.7)
Neutrophils Relative %: 41 %
Platelets: 188 10*3/uL (ref 150–400)
RBC: 3.65 MIL/uL — ABNORMAL LOW (ref 4.22–5.81)
RDW: 13.3 % (ref 11.5–15.5)
WBC: 5.5 10*3/uL (ref 4.0–10.5)
nRBC: 0 % (ref 0.0–0.2)

## 2021-05-05 LAB — COMPREHENSIVE METABOLIC PANEL
ALT: 143 U/L — ABNORMAL HIGH (ref 0–44)
AST: 89 U/L — ABNORMAL HIGH (ref 15–41)
Albumin: 3.8 g/dL (ref 3.5–5.0)
Alkaline Phosphatase: 37 U/L — ABNORMAL LOW (ref 38–126)
Anion gap: 7 (ref 5–15)
BUN: 23 mg/dL — ABNORMAL HIGH (ref 6–20)
CO2: 25 mmol/L (ref 22–32)
Calcium: 9.3 mg/dL (ref 8.9–10.3)
Chloride: 107 mmol/L (ref 98–111)
Creatinine, Ser: 0.72 mg/dL (ref 0.61–1.24)
GFR, Estimated: >60 mL/min (ref >60)
Glucose, Bld: 127 mg/dL — ABNORMAL HIGH (ref 70–99)
Potassium: 3.7 mmol/L (ref 3.5–5.1)
Sodium: 139 mmol/L (ref 135–145)
Total Bilirubin: 0.5 mg/dL (ref 0.3–1.2)
Total Protein: 7.4 g/dL (ref 6.5–8.1)

## 2021-05-05 LAB — MAGNESIUM: Magnesium: 2 mg/dL (ref 1.7–2.4)

## 2021-05-05 LAB — PHOSPHORUS: Phosphorus: 2.9 mg/dL (ref 2.5–4.6)

## 2021-05-05 NOTE — Progress Notes (Signed)
?PROGRESS NOTE ? ? ? ?Steve [name]  IRS:[id number] DOB: 1965-06-06 DOA: 01/05/2021 ?PCP: No primary care provider on file.  ? ?Brief Narrative:  ?Steve [name] is a 56 y.o. male with history of major depression, anxiety who was in a residential ETOH treatment center for last 5 months. He was brought to the ED on 12/20 from a residential ETOH treatment center for convalescence, combativeness and altered sensorium.  Per collateral history, patient was not acting typical for last few days. ? ?In the ED, patient was confused, combative. Labs showed AKI with creatinine of 3.27, rhabdomyolysis, abnormal LFTs, elevated WBC count at 23.7 and lactic acid level at 7.6. 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 Blood culture was sent, empiric antibiotic started.  IV fluid resuscitation started and patient was initially admitted to ICU. ?12/21, underwent LP which showed staph in CSF, felt to be contaminant.  EEG did not show any seizures ?12/24, HD catheter was placed but patient did not require HD as creatinine started to improve ?12/31, with clinical improvement, patient was transferred out of ICU to [facility] ?1/18 Cognitive evaluations: 2 on the SLUMS, a 9 on the MMSE, and a 0 on medi-cog and short blessed test ? ? ?On 05/04/2021 he became very agitated and was given additional dose of Klonopin ? ?Currently awaiting SNF placement and but still has no bed offers and still awaiting disability and long-term Medicaid.  ? ?Assessment and Plan: ?* Cognitive impairment  2/2 Wernicke's encephalopathy and alcoholic dementia ?Initial MRI revealed tiny acute infarct on the right but not significant enough to explain persistent cognitive and behavioral abnormalities. Also evidence of prior lacunar infarct right basal ganglia ?Cognitive screening revealed significant cognitive deficits ?-Appreciate psychiatric team  assistance.  Continue Risperdal, Prozac, trazodone, Depakote and melatonin.  ?-Continue sitter to allow patient to leave the room in the unit with supervision ?-Had to be given another dose of Klonopin 1 mg x1 on 05/04/21 ? ?**Please note that sitter is being utilized in the acute hospital setting noting in the hospital setting we are unable to provide the same physical barriers as a locked unit and therefore for the patient's safety until patient is discharged he will need to require a sitter and a locked unit due to his recurrent wandering behaviors which increase at night. ? ?After discharge recommendations: PLEASE NOTE THAT THIS PT'S REGIMEN OF PSYCHOTROPIC MEDS CAN NEVER BE WEANED AND DC'D DUE TO SEVERITY OF HIS WERNICKE'S DEMENTIA ? ? ?Constipation ?-Resolved ? ?Eczema ?-Resolved with use of topical cortisone cream but has reemerged on his chest.  Hopeful that current prednisone will help.   ?-After prednisone completed will likely need to resume topical cortisone cream at a low dose and this was resumed with topical cortisone to all areas on extremities, abdomen and back  ? ?Protein calorie malnutrition ([name]) ?Patient able to feed self independently and is eating well ? ?Witnessed seizure ([name]) ?-No apparent prior history of seizure disorder noting patient was not on AEDs prior to admission ?-Unclear if withdrawal seizure or related to other substances ?-3/10 given recent behavioral changes attending felt EEG needed to be completed to rule out atypical seizure activity causing behavioral manifestations.  EEG unremarkable other than for moderate diffuse encephalopathy.  No seizure activity. ?-Continue Keppra at recommendation of neurology ? ?Right ventricular dysfunction ?-Echo 1/2 with EF 60 to 65%.  Cannot rule out a small PFO this finding  determined to be clinically insignificant given the patient was asymptomatic ? ?Physical deconditioning 2/2 ambulatory dysfunction ?Patient now ambulating without  difficulty ? ?A physical therapy consult is indicated based on the patient?s mobility assessment. ? ?Mobility Assessment (last 72 hours)   ? ? Mobility Assessment   ? ? Row Name 05/05/21 8588 05/04/21 2040 05/04/21 0803 05/03/21 0830  ?  ? Does patient have an order for bedrest or is patient medically unstable No - Continue assessment No - Continue assessment No - Continue assessment No - Continue assessment   ? What is the highest level of mobility based on the progressive mobility assessment? Level 6 (Walks independently in room and hall) - Balance while walking in room without assist - Complete Level 6 (Walks independently in room and hall) - Balance while walking in room without assist - Complete Level 6 (Walks independently in room and hall) - Balance while walking in room without assist - Complete Level 5 (Walks with assist in room/hall) - Balance while stepping forward/back and can walk in room with assist - Complete   ? Is the above level different from baseline mobility prior to current illness? No - Consider discontinuing PT/OT No - Consider discontinuing PT/OT No - Consider discontinuing PT/OT No - Consider discontinuing PT/OT   ? ?  ?  ? ?  ? ? ?Skin abnormalities/perineal MASD ?Resolved ? ?Elevated transaminases at time of presentation-resolved as of 03/04/2021 ?-Likely secondary to sepsis like physiology at presentation ?-His AST and ALT were significantly elevated to over 700s on admission.  ?-Re emergence mild elevation LFTs-likely due to Zyprexa and Depakote ?-Due to oversedation Zyprexa dose decreased ?-LFTs have normalized with decrease in Zyprexa dose. ?-2/16 valproic acid level 55 ? ?Aspiration pneumonia ([name])-resolved as of 03/04/2021 ?-Completed a course of Zosyn ? ?Possible DVT (deep venous thrombosis) ([name])-resolved as of 03/17/2021 ?-Per critical care note, hemodialysis cath had a clot on it when it was removed.  UE duplex  12/31: no DVT.   ? ?Acute kidney injury with rhabdomyolysis-resolved as  of 03/17/2021 ?-Resolved ?-Creatinine peaked at 7.48 on 12/26. -Nephrology consulted with initial plans to initiate dialysis including placement of [facility] but renal function gradually improved and dialysis not needed and TDC removed ?-Last BUN/Cr was 23/0.72 ?-Avoid further nephrotoxic medications, contrast dyes, hypotension and dehydration ?-Repeat BMP/CMP intermittently ? ?Severe sepsis without septic shock ([name])-resolved as of 03/17/2021 ?Ruled out ?-Sepsis-like physiology secondary to presentation with profound dehydration, hypoperfusion from dehydration related hypotension seizure activity ?-Chest work-up was negative ? ?Possible Morton's neuroma ?-Complaining of numbness and tingling with weightbearing on left great toe as well as the adjacent 2 toes ?-Continue Neurontin, short-term or Oxy IR ?-Has completed 7 days of scheduled ibuprofen ? ?Possible abscess anterior neck ?-Has completed 7 days of Keflex ? ?DVT prophylaxis: rivaroxaban (XARELTO) tablet 10 mg Start: 02/27/21 1000 ?rivaroxaban (XARELTO) tablet 10 mg  ?  Code Status: Full Code ?Family Communication: No family present at bedside but has a Comptroller ? ?Disposition Plan:  ?Level of care: Med-Surg ?Status is: Inpatient ? ?Remains inpatient appropriate because: Unsafe Discharge Disposition  ? ?Consultants:  ?Nephrology  ?Psychiatry ? ?Procedures:  ?EEG ?Echocardiogram ?TDC ? ?Antimicrobials:  ?Anti-infectives (From admission, onward)  ? ? Start     Dose/Rate Route Frequency Ordered Stop  ? 04/19/21 1400  cephALEXin (KEFLEX) capsule 500 mg       ? 500 mg Oral Every 8 hours 04/19/21 1156 04/26/21 0625  ? 01/07/21 1500  vancomycin (VANCOREADY) IVPB 750 mg/150 mL  Status:  Discontinued       ? 750 mg ?150 mL/hr over 60 Minutes Intravenous Every 24 hours 01/06/21 1326 01/07/21 0802  ? 01/07/21 0802  vancomycin variable dose per unstable renal function (pharmacist dosing)  Status:  Discontinued       ?  Does not apply See admin instructions 01/07/21 0802 01/07/21 1000   ? 01/06/21 2200  cefTRIAXone (ROCEPHIN) 2 g in sodium chloride 0.9 % 100 mL IVPB  Status:  Discontinued       ? 2 g ?200 mL/hr over 30 Minutes Intravenous Every 12 hours 01/06/21 1635 01/07/21 1000  ? 01/06/21 16

## 2021-05-06 DIAGNOSIS — G4700 Insomnia, unspecified: Secondary | ICD-10-CM

## 2021-05-06 LAB — COMPREHENSIVE METABOLIC PANEL
ALT: 164 U/L — ABNORMAL HIGH (ref 0–44)
AST: 106 U/L — ABNORMAL HIGH (ref 15–41)
Albumin: 4 g/dL (ref 3.5–5.0)
Alkaline Phosphatase: 40 U/L (ref 38–126)
Anion gap: 7 (ref 5–15)
BUN: 22 mg/dL — ABNORMAL HIGH (ref 6–20)
CO2: 24 mmol/L (ref 22–32)
Calcium: 9.3 mg/dL (ref 8.9–10.3)
Chloride: 107 mmol/L (ref 98–111)
Creatinine, Ser: 0.83 mg/dL (ref 0.61–1.24)
GFR, Estimated: >60 mL/min (ref >60)
Glucose, Bld: 117 mg/dL — ABNORMAL HIGH (ref 70–99)
Potassium: 4 mmol/L (ref 3.5–5.1)
Sodium: 138 mmol/L (ref 135–145)
Total Bilirubin: 0.7 mg/dL (ref 0.3–1.2)
Total Protein: 7.6 g/dL (ref 6.5–8.1)

## 2021-05-06 MED ORDER — DIPHENHYDRAMINE HCL 25 MG PO CAPS: 25.0000 mg | ORAL_CAPSULE | Freq: Every evening | ORAL | Status: DC | PRN

## 2021-05-06 NOTE — Assessment & Plan Note (Addendum)
-  Discontinued Trazodone 100 mg po qHS was changed to Benadryl 50 mg p.o. nightly and he is also having melatonin 10 mg p.o. nightly ?-Continue to Monitor  ?-Appreciate psychiatric evaluation and recommendations and evaluation ?

## 2021-05-06 NOTE — Progress Notes (Signed)
CSW spoke with [facility] who states there is a locked unit in [facility]. CSW sent clinicals to [facility] for review. ? ?Edwin Dada, MSW, LCSW ?Transitions of Care  Clinical Social Worker II ?[phone] ? ?

## 2021-05-06 NOTE — Progress Notes (Addendum)
?PROGRESS NOTE ? ? ? ?Steve [name]  ZOX:[id number] DOB: 1965-10-24 DOA: 01/05/2021 ?PCP: No primary care provider on file.  ? ?Brief Narrative:  ?Steve [name] is a 56 y.o. male with history of major depression, anxiety who was in a residential ETOH treatment center for last 5 months. He was brought to the ED on 12/20 from a residential ETOH treatment center for convalescence, combativeness and altered sensorium.  Per collateral history, patient was not acting typical for last few days. ? ?In the ED, patient was confused, combative. Labs showed AKI with creatinine of 3.27, rhabdomyolysis, abnormal LFTs, elevated WBC count at 23.7 and lactic acid level at 7.6. 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 Blood culture was sent, empiric antibiotic started.  IV fluid resuscitation started and patient was initially admitted to ICU. ?12/21, underwent LP which showed staph in CSF, felt to be contaminant.  EEG did not show any seizures ?12/24, HD catheter was placed but patient did not require HD as creatinine started to improve ?12/31, with clinical improvement, patient was transferred out of ICU to [facility]. ?1/18 Cognitive evaluations: 2 on the SLUMS, a 9 on the MMSE, and a 0 on medi-cog and short blessed test ? ? ?On 05/04/2021 he became very agitated and was given additional dose of Klonopin ? ?Currently awaiting SNF placement and but still has no bed offers and still awaiting disability and long-term Medicaid.  ? ? ?Assessment and Plan: ?* Cognitive impairment  2/2 Wernicke's encephalopathy and alcoholic dementia ?Initial MRI revealed tiny acute infarct on the right but not significant enough to explain persistent cognitive and behavioral abnormalities. Also evidence of prior lacunar infarct right basal ganglia ?Cognitive screening revealed significant cognitive deficits ?-Appreciate psychiatric team  assistance.  Continue Risperdal, Prozac, trazodone, Depakote and melatonin.  ?-Continue sitter to allow patient to leave the room in the unit with supervision ?-Had to be given another dose of Klonopin 1 mg x1 on 05/04/21 ? ?**Please note that sitter is being utilized in the acute hospital setting noting in the hospital setting we are unable to provide the same physical barriers as a locked unit and therefore for the patient's safety until patient is discharged he will need to require a sitter and a locked unit due to his recurrent wandering behaviors which increase at night. ? ?After discharge recommendations: PLEASE NOTE THAT THIS PT'S REGIMEN OF PSYCHOTROPIC MEDS CAN NEVER BE WEANED AND DC'D DUE TO SEVERITY OF HIS WERNICKE'S DEMENTIA ? ? ?Insomnia ?-C/w Trazodone 100 mg po qHS and may need to Change given he still is not sleeping ?-States he takes Ambien as well  ?-Continue to Monitor and Trend and will add Benadryl qHS ? ?Constipation ?-Resolved ? ?Eczema ?-Resolved with use of topical cortisone cream but has reemerged on his chest.  Hopeful that current prednisone will help.   ?-After prednisone completed will likely need to resume topical cortisone cream at a low dose and this was resumed with topical cortisone to all areas on extremities, abdomen and back  ? ?Protein calorie malnutrition ([name]) ?Patient able to feed self independently and is eating well ? ?Witnessed seizure ([name]) ?-No apparent prior history of seizure disorder noting patient was not on AEDs prior to admission ?-Unclear if withdrawal seizure or related to other substances ?-3/10 given recent behavioral changes attending felt EEG needed to be completed to rule out atypical seizure activity causing behavioral manifestations.  EEG  unremarkable other than for moderate diffuse encephalopathy.  No seizure activity. ?-Continue Keppra at recommendation of neurology ? ?Right ventricular dysfunction ?-Echo 1/2 with EF 60 to 65%.  Cannot rule out a small  PFO this finding determined to be clinically insignificant given the patient was asymptomatic ? ?Physical deconditioning 2/2 ambulatory dysfunction ?Patient now ambulating without difficulty ? ?A physical therapy consult is indicated based on the patient?s mobility assessment. ? ?Mobility Assessment (last 72 hours)   ? ? Mobility Assessment   ? ? Row Name 05/06/21 0800 05/05/21 20:23:25 05/05/21 0835 05/04/21 2040 05/04/21 0803  ? Does patient have an order for bedrest or is patient medically unstable No - Continue assessment No - Continue assessment No - Continue assessment No - Continue assessment No - Continue assessment  ? What is the highest level of mobility based on the progressive mobility assessment? Level 6 (Walks independently in room and hall) - Balance while walking in room without assist - Complete Level 6 (Walks independently in room and hall) - Balance while walking in room without assist - Complete Level 6 (Walks independently in room and hall) - Balance while walking in room without assist - Complete Level 6 (Walks independently in room and hall) - Balance while walking in room without assist - Complete Level 6 (Walks independently in room and hall) - Balance while walking in room without assist - Complete  ? Is the above level different from baseline mobility prior to current illness? No - Consider discontinuing PT/OT No - Consider discontinuing PT/OT No - Consider discontinuing PT/OT No - Consider discontinuing PT/OT No - Consider discontinuing PT/OT  ? ?  ?  ? ?  ? ? ?Skin abnormalities/perineal MASD ?Resolved ? ?Elevated transaminases at time of presentation-resolved as of 03/04/2021 ?-Likely secondary to sepsis like physiology at presentation ?-His AST and ALT were significantly elevated to over 700s on admission.  ?-Re emergence mild elevation LFTs-likely due to Zyprexa and Depakote ?-Due to oversedation Zyprexa dose decreased ?-LFTs have normalized with decrease in Zyprexa dose but now LFTs  are slightly worsening again.  ?-2/16 valproic acid level 55 ?-AST is now 89 and ALT is 143 on last check  ?-Continue to Monitor and Trend and Repeat CMP in the AM  ? ?Aspiration pneumonia ([name])-resolved as of 03/04/2021 ?-Completed a course of Zosyn ? ?Possible DVT (deep venous thrombosis) ([name])-resolved as of 03/17/2021 ?-Per critical care note, hemodialysis cath had a clot on it when it was removed.  UE duplex  12/31: no DVT.   ? ?Acute kidney injury with rhabdomyolysis-resolved as of 03/17/2021 ?-Resolved ?-Creatinine peaked at 7.48 on 12/26.  ?-Nephrology consulted with initial plans to initiate dialysis including placement of [facility] but renal function gradually improved and dialysis not needed and TDC removed ?-Last BUN/Cr was 23/0.72 ?-Avoid further nephrotoxic medications, contrast dyes, hypotension and dehydration ?-Repeat BMP/CMP intermittently ? ?Severe sepsis without septic shock ([name])-resolved as of 03/17/2021 ?Ruled out ?-Sepsis-like physiology secondary to presentation with profound dehydration, hypoperfusion from dehydration related hypotension seizure activity ?-Chest work-up was negative ? ?Possible Morton's neuroma ?-Complaining of numbness and tingling with weightbearing on left great toe as well as the adjacent 2 toes ?-Continue Neurontin, short-term or Oxy IR ?-Has completed 7 days of scheduled ibuprofen ?  ?Possible abscess anterior neck ?-Has completed 7 days of Keflex ?  ?Left knee pain ?-Noted to be a new finding primarily over the patella noted with palpation. ?-Plain films done negative. ? ?Left lower extremity swelling ?-Patient noted to have some chronic  swelling and pain in the left foot and ankle extending to the calf. ?-Lower extremity Dopplers negative for DVT. ?-Swelling improving. ?-Plain films negative for any acute fracture. ?-Supportive care. ? ?Left ankle pain ?-Received prednisone and colchicine for possible gout. ?-Improved. ?-No further work-up needed ? ?Protein calorie  malnutrition ?-Patient able to feed himself independently and eating well. ? ?DVT prophylaxis: rivaroxaban (XARELTO) tablet 10 mg Start: 02/27/21 1000 ?rivaroxaban (XARELTO) tablet 10 mg  ?  Code Status: Full Code ?Fami

## 2021-05-07 LAB — CBC WITH DIFFERENTIAL/PLATELET
Abs Immature Granulocytes: 0.01 10*3/uL (ref 0.00–0.07)
Basophils Absolute: 0 10*3/uL (ref 0.0–0.1)
Basophils Relative: 1 %
Eosinophils Absolute: 0.2 10*3/uL (ref 0.0–0.5)
Eosinophils Relative: 4 %
HCT: 33.7 % — ABNORMAL LOW (ref 39.0–52.0)
Hemoglobin: 11.6 g/dL — ABNORMAL LOW (ref 13.0–17.0)
Immature Granulocytes: 0 %
Lymphocytes Relative: 49 %
Lymphs Abs: 2.9 10*3/uL (ref 0.7–4.0)
MCH: 33.8 pg (ref 26.0–34.0)
MCHC: 34.4 g/dL (ref 30.0–36.0)
MCV: 98.3 fL (ref 80.0–100.0)
Monocytes Absolute: 0.8 10*3/uL (ref 0.1–1.0)
Monocytes Relative: 14 %
Neutro Abs: 1.9 10*3/uL (ref 1.7–7.7)
Neutrophils Relative %: 32 %
Platelets: 163 10*3/uL (ref 150–400)
RBC: 3.43 MIL/uL — ABNORMAL LOW (ref 4.22–5.81)
RDW: 13.2 % (ref 11.5–15.5)
WBC: 5.9 10*3/uL (ref 4.0–10.5)
nRBC: 0 % (ref 0.0–0.2)

## 2021-05-07 LAB — COMPREHENSIVE METABOLIC PANEL
ALT: 127 U/L — ABNORMAL HIGH (ref 0–44)
ALT: 122 U/L — ABNORMAL HIGH (ref 0–44)
AST: 78 U/L — ABNORMAL HIGH (ref 15–41)
AST: 84 U/L — ABNORMAL HIGH (ref 15–41)
Albumin: 3.5 g/dL (ref 3.5–5.0)
Albumin: 3.3 g/dL — ABNORMAL LOW (ref 3.5–5.0)
Alkaline Phosphatase: 34 U/L — ABNORMAL LOW (ref 38–126)
Alkaline Phosphatase: 33 U/L — ABNORMAL LOW (ref 38–126)
Anion gap: 7 (ref 5–15)
Anion gap: 8 (ref 5–15)
BUN: 18 mg/dL (ref 6–20)
BUN: 19 mg/dL (ref 6–20)
CO2: 26 mmol/L (ref 22–32)
CO2: 23 mmol/L (ref 22–32)
Calcium: 9.1 mg/dL (ref 8.9–10.3)
Calcium: 8.7 mg/dL — ABNORMAL LOW (ref 8.9–10.3)
Chloride: 107 mmol/L (ref 98–111)
Chloride: 108 mmol/L (ref 98–111)
Creatinine, Ser: 0.71 mg/dL (ref 0.61–1.24)
Creatinine, Ser: 0.68 mg/dL (ref 0.61–1.24)
GFR, Estimated: >60 mL/min (ref >60)
GFR, Estimated: >60 mL/min (ref >60)
Glucose, Bld: 85 mg/dL (ref 70–99)
Glucose, Bld: 104 mg/dL — ABNORMAL HIGH (ref 70–99)
Potassium: 3.7 mmol/L (ref 3.5–5.1)
Potassium: 4.5 mmol/L (ref 3.5–5.1)
Sodium: 140 mmol/L (ref 135–145)
Sodium: 139 mmol/L (ref 135–145)
Total Bilirubin: 0.5 mg/dL (ref 0.3–1.2)
Total Bilirubin: 0.9 mg/dL (ref 0.3–1.2)
Total Protein: 6.4 g/dL — ABNORMAL LOW (ref 6.5–8.1)
Total Protein: 6.3 g/dL — ABNORMAL LOW (ref 6.5–8.1)

## 2021-05-07 LAB — MAGNESIUM: Magnesium: 2 mg/dL (ref 1.7–2.4)

## 2021-05-07 LAB — PHOSPHORUS: Phosphorus: 3.7 mg/dL (ref 2.5–4.6)

## 2021-05-07 MED ORDER — CLONAZEPAM 0.5 MG PO TABS
1.0000 mg | ORAL_TABLET | Freq: Once | ORAL | Status: AC
  Administered 2021-05-07: 1 mg via ORAL
  Filled 2021-05-07: qty 2

## 2021-05-07 MED ORDER — DICLOFENAC SODIUM 1 % EX GEL
2.0000 g | Freq: Four times a day (QID) | CUTANEOUS | Status: DC
  Administered 2021-05-07 – 2021-05-13 (×17): 2 g via TOPICAL
  Filled 2021-05-07 (×3): qty 100

## 2021-05-07 NOTE — Progress Notes (Signed)
Patient having increased agitation about being hospitalized. Patient states, "I'm leaving and no one can stop me; don't need paper work".  When staff tried to redirect patient and let him talk more about his agitation; patient states, "I'm headed to [place] and I'm going to buy jack daniels when I leave here; take me to the store for a suit case and I'll come back to my room".  Staff assisted with diversions and walked patient around and off the unit to get food. Patient cooperative to take medication for his nerves. Continue with safety sitter to prevent elopement. ?

## 2021-05-07 NOTE — Progress Notes (Signed)
CSW attempted to reach [name] at [facility] without success - a text was sent requesting a return call. ? ?Edwin Dada, MSW, LCSW ?Transitions of Care  Clinical Social Worker II ?[phone] ? ?

## 2021-05-07 NOTE — Progress Notes (Signed)
?PROGRESS NOTE ? ? ? ?Steve [name]  ZMO:[id number] DOB: 06/11/1965 DOA: 01/05/2021 ?PCP: No primary care provider on file.  ? ?Brief Narrative:  ?Steve [name] is a 56 y.o. male with history of major depression, anxiety who was in a residential ETOH treatment center for last 5 months. He was brought to the ED on 12/20 from a residential ETOH treatment center for convalescence, combativeness and altered sensorium.  Per collateral history, patient was not acting typical for last few days. ? ?In the ED, patient was confused, combative. Labs showed AKI with creatinine of 3.27, rhabdomyolysis, abnormal LFTs, elevated WBC count at 23.7 and lactic acid level at 7.6. 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 Blood culture was sent, empiric antibiotic started.  IV fluid resuscitation started and patient was initially admitted to ICU. ?12/21, underwent LP which showed staph in CSF, felt to be contaminant.  EEG did not show any seizures ?12/24, HD catheter was placed but patient did not require HD as creatinine started to improve ?12/31, with clinical improvement, patient was transferred out of ICU to [facility]. ?1/18 Cognitive evaluations: 2 on the SLUMS, a 9 on the MMSE, and a 0 on medi-cog and short blessed test ? ? ?On 05/04/2021 he became very agitated and was given additional dose of Klonopin. Additionally he became agitated and started threatening to Leave AMA again on 05/07/21.  Psychiatry has been consulted for further evaluation recommendations given that he continues to have wandering behaviors. His SLUMS score is improved to 24 out of 30. ? ?Currently awaiting A locked unit in a Memory Care Unit and but still has no bed offers and still awaiting disability and long-term Medicaid.  Several barriers for his memory care unit placement given that he is young, has no income and has a history of alcohol use.   ? ? ?Assessment and Plan: ?* Cognitive impairment  2/2 Wernicke's encephalopathy and alcoholic dementia ?-Initial MRI revealed tiny acute infarct on the right but not significant enough to explain persistent cognitive and behavioral abnormalities. Also evidence of prior lacunar infarct right basal ganglia ?Cognitive screening revealed significant cognitive deficits ?-Appreciate psychiatric team assistance.  Continue Risperdal, Prozac, trazodone, Depakote and melatonin.  ?-Continue sitter to allow patient to leave the room in the unit with supervision ?-Repeat SLUMS score was 24/30 on 04/21/21 ?-Given wandering behaviors and threats again will re-consult Pscyh and they will see the patient tomorrow ? ?-Had to be given another dose of Klonopin 1 mg x1 on 05/04/21 and on 05/07/21 ? ?**Please note that sitter is being utilized in the acute hospital setting noting in the hospital setting we are unable to provide the same physical barriers as a locked unit and therefore for the patient's safety until patient is discharged he will need to require a sitter and a locked unit due to his recurrent wandering behaviors which increase at night. ? ?After discharge recommendations: PLEASE NOTE THAT THIS PT'S REGIMEN OF PSYCHOTROPIC MEDS CAN NEVER BE WEANED AND DC'D DUE TO SEVERITY OF HIS WERNICKE'S DEMENTIA but will have Psych re-evaluate ? ? ?Insomnia ?-C/w Trazodone 100 mg po qHS and may need to Change given he still is not sleeping ?-States he takes Ambien as well  ?-Continue to Monitor and Trend and will add Benadryl qHS ? ?Constipation ?-Resolved ? ?Eczema ?-Resolved with use of topical cortisone cream but has reemerged on his chest.  Hopeful that current prednisone  will help.   ?-After prednisone completed will likely need to resume topical cortisone cream at a low dose and this was resumed with topical cortisone to all areas on extremities, abdomen and back  ? ?Protein calorie malnutrition ([name]) ?Patient able to feed self  independently and is eating well ? ?Witnessed seizure ([name]) ?-No apparent prior history of seizure disorder noting patient was not on AEDs prior to admission ?-Unclear if withdrawal seizure or related to other substances ?-3/10 given recent behavioral changes attending felt EEG needed to be completed to rule out atypical seizure activity causing behavioral manifestations.  EEG unremarkable other than for moderate diffuse encephalopathy.  No seizure activity. ?-Continue Keppra at recommendation of neurology ? ?Right ventricular dysfunction ?-Echo 1/2 with EF 60 to 65%.  Cannot rule out a small PFO this finding determined to be clinically insignificant given the patient was asymptomatic ? ?Physical deconditioning 2/2 ambulatory dysfunction ?Patient now ambulating without difficulty ? ?A physical therapy consult is indicated based on the patient?s mobility assessment. ? ?Mobility Assessment (last 72 hours)   ? ? Mobility Assessment   ? ? Row Name 05/06/21 0800 05/05/21 20:23:25 05/05/21 0835 05/04/21 2040  ?  ? Does patient have an order for bedrest or is patient medically unstable No - Continue assessment No - Continue assessment No - Continue assessment No - Continue assessment   ? What is the highest level of mobility based on the progressive mobility assessment? Level 6 (Walks independently in room and hall) - Balance while walking in room without assist - Complete Level 6 (Walks independently in room and hall) - Balance while walking in room without assist - Complete Level 6 (Walks independently in room and hall) - Balance while walking in room without assist - Complete Level 6 (Walks independently in room and hall) - Balance while walking in room without assist - Complete   ? Is the above level different from baseline mobility prior to current illness? No - Consider discontinuing PT/OT No - Consider discontinuing PT/OT No - Consider discontinuing PT/OT No - Consider discontinuing PT/OT   ? ?  ?  ? ?  ? ? ?Skin  abnormalities/perineal MASD ?-Resolved ? ?Elevated transaminases at time of presentation-resolved as of 03/04/2021 ?-Likely secondary to sepsis like physiology at presentation ?-His AST and ALT were significantly elevated to over 700s on admission.  ?-Re emergence mild elevation LFTs-likely due to Zyprexa and Depakote ?-Due to oversedation Zyprexa dose decreased ?-LFTs have normalized with decrease in Zyprexa dose but now LFTs are slightly worsening again.  ?-2/16 valproic acid level 55 ?-AST has gone from 89 -> 106 -> 78 and ALT has gone from 143 -> 164 -> 127 on last check  ?-Continue to Monitor and Trend and Repeat CMP in the AM  ? ?Aspiration pneumonia ([name])-resolved as of 03/04/2021 ?-Completed a course of Zosyn ? ?Possible DVT (deep venous thrombosis) ([name])-resolved as of 03/17/2021 ?-Per critical care note, hemodialysis cath had a clot on it when it was removed.  UE duplex  12/31: no DVT.   ? ?Acute kidney injury with rhabdomyolysis-resolved as of 03/17/2021 ?-Resolved ?-Creatinine peaked at 7.48 on 12/26.  ?-Nephrology consulted with initial plans to initiate dialysis including placement of [facility] but renal function gradually improved and dialysis not needed and TDC removed ?-Last BUN/Cr was 18/0.71 ?-Avoid further nephrotoxic medications, contrast dyes, hypotension and dehydration ?-Repeat BMP/CMP intermittently ? ?Severe sepsis without septic shock ([name])-resolved as of 03/17/2021 ?Ruled out ?-Sepsis-like physiology secondary to presentation with profound dehydration, hypoperfusion from  dehydration related hypotension seizure activity ?-Chest work-up was negative ? 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  ?Possible abscess anterior neck ?-Has completed 7 days of Keflex ?  ?Left knee pain ?-Noted to be a new finding primarily over the  patella noted with palpation. ?-Plain films done negative. ?  ?Left lower extremity swelling ?-Patient noted to have some chronic swelling and pain in the left foot and ankle extending to the calf. ?-Lower ex

## 2021-05-07 NOTE — Progress Notes (Signed)
Patient took nap between 2:45-4pm; when he awoke he came out of the room and headed for the exit; not listening to sitter; he states he is leaving; with staff assistance he redirected to his room; continues with frustration over leaving the hospital.   ?

## 2021-05-07 NOTE — Progress Notes (Signed)
Lab called the [facility] was being corrected on lab results; messaged MD to be aware; continue to monitor. ?

## 2021-05-07 NOTE — Progress Notes (Signed)
Patient laying back down again in bed; sleeping again presently; sitter at bedside; decreasing stimulation as much as possible; less agitated. ?

## 2021-05-08 DIAGNOSIS — F418 Other specified anxiety disorders: Secondary | ICD-10-CM

## 2021-05-08 DIAGNOSIS — M79672 Pain in left foot: Secondary | ICD-10-CM

## 2021-05-08 DIAGNOSIS — N179 Acute kidney failure, unspecified: Secondary | ICD-10-CM | POA: Diagnosis not present

## 2021-05-08 DIAGNOSIS — D509 Iron deficiency anemia, unspecified: Secondary | ICD-10-CM

## 2021-05-08 DIAGNOSIS — R7401 Elevation of levels of liver transaminase levels: Secondary | ICD-10-CM | POA: Diagnosis not present

## 2021-05-08 DIAGNOSIS — E512 Wernicke's encephalopathy: Secondary | ICD-10-CM | POA: Diagnosis not present

## 2021-05-08 DIAGNOSIS — L309 Dermatitis, unspecified: Secondary | ICD-10-CM | POA: Diagnosis not present

## 2021-05-08 LAB — CBC WITH DIFFERENTIAL/PLATELET
Abs Immature Granulocytes: 0.01 10*3/uL (ref 0.00–0.07)
Basophils Absolute: 0 10*3/uL (ref 0.0–0.1)
Basophils Relative: 1 %
Eosinophils Absolute: 0.2 10*3/uL (ref 0.0–0.5)
Eosinophils Relative: 4 %
HCT: 34.4 % — ABNORMAL LOW (ref 39.0–52.0)
Hemoglobin: 12.1 g/dL — ABNORMAL LOW (ref 13.0–17.0)
Immature Granulocytes: 0 %
Lymphocytes Relative: 49 %
Lymphs Abs: 2.7 10*3/uL (ref 0.7–4.0)
MCH: 34.8 pg — ABNORMAL HIGH (ref 26.0–34.0)
MCHC: 35.2 g/dL (ref 30.0–36.0)
MCV: 98.9 fL (ref 80.0–100.0)
Monocytes Absolute: 0.7 10*3/uL (ref 0.1–1.0)
Monocytes Relative: 14 %
Neutro Abs: 1.8 10*3/uL (ref 1.7–7.7)
Neutrophils Relative %: 32 %
Platelets: 158 10*3/uL (ref 150–400)
RBC: 3.48 MIL/uL — ABNORMAL LOW (ref 4.22–5.81)
RDW: 13.2 % (ref 11.5–15.5)
WBC: 5.5 10*3/uL (ref 4.0–10.5)
nRBC: 0 % (ref 0.0–0.2)

## 2021-05-08 LAB — MAGNESIUM: Magnesium: 2 mg/dL (ref 1.7–2.4)

## 2021-05-08 LAB — URIC ACID: Uric Acid, Serum: 3.8 mg/dL (ref 3.7–8.6)

## 2021-05-08 LAB — PHOSPHORUS: Phosphorus: 3.6 mg/dL (ref 2.5–4.6)

## 2021-05-08 MED ORDER — RISPERIDONE 1 MG PO TBDP
1.0000 mg | ORAL_TABLET | Freq: Every morning | ORAL | Status: DC
  Administered 2021-05-09 – 2021-05-14 (×6): 1 mg via ORAL
  Filled 2021-05-08 (×6): qty 1

## 2021-05-08 MED ORDER — RISPERIDONE 0.5 MG PO TABS
2.0000 mg | ORAL_TABLET | Freq: Every day | ORAL | Status: DC
  Administered 2021-05-08 – 2021-05-13 (×6): 2 mg via ORAL
  Filled 2021-05-08 (×6): qty 4

## 2021-05-08 MED ORDER — GABAPENTIN 100 MG PO CAPS
200.0000 mg | ORAL_CAPSULE | Freq: Three times a day (TID) | ORAL | Status: DC
  Administered 2021-05-08 – 2021-05-14 (×18): 200 mg via ORAL
  Filled 2021-05-08 (×18): qty 2

## 2021-05-08 MED ORDER — NAPROXEN 250 MG PO TABS
250.0000 mg | ORAL_TABLET | Freq: Two times a day (BID) | ORAL | Status: AC
  Administered 2021-05-08 – 2021-05-10 (×4): 250 mg via ORAL
  Filled 2021-05-08 (×4): qty 1

## 2021-05-08 MED ORDER — FERROUS SULFATE 325 (65 FE) MG PO TABS
325.0000 mg | ORAL_TABLET | Freq: Every day | ORAL | Status: DC
  Administered 2021-05-08 – 2021-05-13 (×6): 325 mg via ORAL
  Filled 2021-05-08 (×6): qty 1

## 2021-05-08 MED ORDER — DIPHENHYDRAMINE HCL 25 MG PO CAPS
50.0000 mg | ORAL_CAPSULE | Freq: Every day | ORAL | Status: DC
  Administered 2021-05-08 – 2021-05-13 (×6): 50 mg via ORAL
  Filled 2021-05-08 (×6): qty 2

## 2021-05-08 NOTE — Progress Notes (Signed)
?PROGRESS NOTE ? ? ? ?Steve [name]  DXA:[id number] DOB: [birth year] DOA: 01/05/2021 ?PCP: No primary care provider on file.  ? ?Brief Narrative:  ?Steve [name] is a 56 y.o. male with history of major depression, anxiety who was in a residential ETOH treatment center for last 5 months. He was brought to the ED on 12/20 from a residential ETOH treatment center for convalescence, combativeness and altered sensorium.  Per collateral history, patient was not acting typical for last few days. ? ?In the ED, patient was confused, combative. Labs showed AKI with creatinine of 3.27, rhabdomyolysis, abnormal LFTs, elevated WBC count at 23.7 and lactic acid level at 7.6. 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 Blood culture was sent, empiric antibiotic started.  IV fluid resuscitation started and patient was initially admitted to ICU. ?12/21, underwent LP which showed staph in CSF, felt to be contaminant.  EEG did not show any seizures ?12/24, HD catheter was placed but patient did not require HD as creatinine started to improve ?12/31, with clinical improvement, patient was transferred out of ICU to [facility]. ?1/18 Cognitive evaluations: 2 on the SLUMS, a 9 on the MMSE, and a 0 on medi-cog and short blessed test ? ? ?On 05/04/2021 he became very agitated and was given additional dose of Klonopin. Additionally he became agitated and started threatening to Leave AMA again on 05/07/21.  Psychiatry has been consulted for further evaluation recommendations given that he continues to have wandering behaviors. His SLUMS score is improved to 24 out of 30. ? ?Currently awaiting A locked unit in a Memory Care Unit and but still has no bed offers and still awaiting disability and long-term Medicaid.  Several barriers for his memory care unit placement given that he is young, has no income and has a history of alcohol use.   ? ?Assessment and Plan: ?* Cognitive impairment  2/2 Wernicke's encephalopathy and alcoholic dementia ?-Initial MRI revealed tiny acute infarct on the right but not significant enough to explain persistent cognitive and behavioral abnormalities. Also evidence of prior lacunar infarct right basal ganglia ?Cognitive screening revealed significant cognitive deficits ?-Appreciate psychiatric team assistance.  Continue Risperdal, Prozac, trazodone, Depakote and melatonin.  ?-Continue sitter to allow patient to leave the room in the unit with supervision ?-Repeat SLUMS score was 24/30 on 04/21/21 ?-Given wandering behaviors and threats again will re-consult Pscyh and they will see the patient tomorrow ? ?-Had to be given another dose of Klonopin 1 mg x1 on 05/04/21 and on 05/07/21 ? ?**Please note that sitter is being utilized in the acute hospital setting noting in the hospital setting we are unable to provide the same physical barriers as a locked unit and therefore for the patient's safety until patient is discharged he will need to require a sitter and a locked unit due to his recurrent wandering behaviors which increase at night. ? ?After discharge recommendations: PLEASE NOTE THAT THIS PT'S REGIMEN OF PSYCHOTROPIC MEDS CAN NEVER BE WEANED AND DC'D DUE TO SEVERITY OF HIS WERNICKE'S DEMENTIA but will have Psych re-evaluate today  ? ? ?Insomnia ?-C/w Trazodone 100 mg po qHS and may need to Change given he still is not sleeping ?-States he takes Ambien as well  ?-Continue to Monitor and Trend and will add Benadryl qHS ?-Defer to Psych to adjust further  ? ?Constipation ?-Resolved ? ?Eczema ?-Resolved with use of topical cortisone cream but has reemerged  on his chest.  Hopeful that current prednisone will help.   ?-After prednisone completed will likely need to resume topical cortisone cream at a low dose and this was resumed with topical cortisone to all areas on extremities, abdomen and back  ? ?Protein calorie  malnutrition ([name]) ?Patient able to feed self independently and is eating well ? ?Witnessed seizure ([name]) ?-No apparent prior history of seizure disorder noting patient was not on AEDs prior to admission ?-Unclear if withdrawal seizure or related to other substances ?-3/10 given recent behavioral changes attending felt EEG needed to be completed to rule out atypical seizure activity causing behavioral manifestations.  EEG unremarkable other than for moderate diffuse encephalopathy.  No seizure activity. ?-Continue Levetiracetam 500 mg po BID and Valproic Acid 1000 mg qHS and 750 mg qMorning Daily at recommendation of Neurology ? ?Right ventricular dysfunction ?-Echo 1/2 with EF 60 to 65%.  Cannot rule out a small PFO this finding determined to be clinically insignificant given the patient was asymptomatic ? ?Physical deconditioning 2/2 ambulatory dysfunction ?Patient now ambulating without difficulty ? ?A physical therapy consult is indicated based on the patient?s mobility assessment. ? ?Mobility Assessment (last 72 hours)   ? ? Mobility Assessment   ? ? Row Name 05/07/21 2030 05/06/21 0800 05/05/21 20:23:25  ?  ?  ? Does patient have an order for bedrest or is patient medically unstable No - Continue assessment No - Continue assessment No - Continue assessment    ? What is the highest level of mobility based on the progressive mobility assessment? Level 6 (Walks independently in room and hall) - Balance while walking in room without assist - Complete Level 6 (Walks independently in room and hall) - Balance while walking in room without assist - Complete Level 6 (Walks independently in room and hall) - Balance while walking in room without assist - Complete    ? Is the above level different from baseline mobility prior to current illness? No - Consider discontinuing PT/OT No - Consider discontinuing PT/OT No - Consider discontinuing PT/OT    ? ?  ?  ? ?  ? ? ?Skin abnormalities/perineal MASD ?-Resolved ? ?Elevated  transaminases at time of presentation-resolved as of 03/04/2021 ?-Likely secondary was 2/2 sepsis like physiology at presentation ?-His AST and ALT were significantly elevated to over 700s on admission.  ?-Re emergence mild elevation LFTs-likely due to Zyprexa and Depakote ?-Due to oversedation Zyprexa dose decreased ?-LFTs had normalized with decrease in Zyprexa dose but now LFTs are slightly worsening again.  ?-2/16 valproic acid level 55 ?-AST has gone from 89 -> 106 -> 78 -> 84 and ALT has gone from 143 -> 164 -> 127 -> 122 on last check  ?-Continue to Monitor and Trend and Repeat CMP in the AM  ? ?Aspiration pneumonia ([name])-resolved as of 03/04/2021 ?-Completed a course of Zosyn ? ?Possible DVT (deep venous thrombosis) ([name])-resolved as of 03/17/2021 ?-Per critical care note, hemodialysis cath had a clot on it when it was removed.  UE duplex  12/31: no DVT.   ? ?Acute kidney injury with rhabdomyolysis-resolved as of 03/17/2021 ?-Resolved ?-Creatinine peaked at 7.48 on 12/26.  ?-Nephrology consulted with initial plans to initiate dialysis including placement of [facility] but renal function gradually improved and dialysis not needed and TDC removed ?-Last BUN/Cr was improved to 19/0.68 ?-Avoid further nephrotoxic medications, contrast dyes, hypotension and dehydration ?-Repeat BMP/CMP intermittently ? ?Severe sepsis without septic shock ([name])-resolved as of 03/17/2021 ?Ruled out ?-Sepsis-like physiology secondary to  presentation with profound dehydration, hypoperfusion from dehydration related hypotension seizure activity ?-Chest work-up was negative ?-He is afebrile and last WBC is 5.5  ? ? 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  ?Possible abscess anterior neck ?-Has completed 7 days of Keflex ?  ?Left knee pain ?-Noted to be a new finding  primarily over the patella noted with palpation. ?-Plain films done negative. ?  ?Left lower extremity swelling ?-Patient noted to have some chronic swelling and pain in the left foot and ankle extending to the c

## 2021-05-08 NOTE — Consult Note (Addendum)
[facility] Face-to-Face Psychiatry Consult  ? ?Reason for Consult:Severe night time agitation ?Referring Physician:  Raiford Noble ?Patient Identification: Steve [name] ?MRN:  [id number] ?Principal Diagnosis: Wernicke's encephalopathy ?Diagnosis:  Principal Problem: ?  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  Skin abnormalities/perineal MASD ?  Physical deconditioning 2/2 ambulatory dysfunction ?  Right ventricular dysfunction ?  Eczema ?  Pressure injury of skin ?  Constipation ?  Insomnia ? ? ?Total Time Spent in Direct Patient Care:  ?I personally spent 65 minutes on the unit in direct patient care. The direct patient care time included face-to-face time with the patient, reviewing the patient's chart, communicating with other professionals, and coordinating care. Greater than 50% of this time was spent in counseling or coordinating care with the patient regarding goals of hospitalization, psycho-education, and discharge planning needs. ? ? ?HPI:   Steve [name] is a 56 y.o. male with history of major depression, anxiety who was in a residential ETOH treatment center for last 5 months. He was brought to the ED on 12/20 from a residential ETOH treatment center for convalescence, combativeness and altered sensorium.  Per collateral history, patient was not acting typical for last few days. ? ?Subjective: " I asked them to stop the trazodone and ambien because I was waking up feeling drunk." ? ?Patient is seen today.  Patient is awake and alert x3, sitting in a chair.   ?States he has not slept well the last 3 nights due to all the noises in the hospital, and finds that he has to re-orient himself.  ?He has not had contact with his children, but is aware that he cannot live with them.  He states that the social work team has been speaking with his family.  He gets very frustrated that he keeps being told that he will discharge and then told he won't.  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  (He has been here 123).  Despite his awareness of length of hospitalization, he is still disoriented to date. States date as October 19. 2023.  Then guesses August.  Re-orients to date when looking at the board.  He states correct hospital, but first says location is in [facility], but quickly corrects himself to [place]. ?Patient reports his mood as "good".  He is describes a good appetite even though the food is bad, and needs salt and pepper.  He has been compliant with medication, however does not have full awareness of what he takes, but does recognize that he is unable to keep up with his own medications even if he had a pillbox.  Today, he says it might be worth taking the place with the room-mate to be able to discharge from the hospital. ?Patient denies any physical complaints at this time.  He notes that his feet feel better, and he was relieved to find out that he does not have gout.  He states that gabapentin has been helpful for him, particularly in managing anxiety but may also help the feet.  He is agreeable to a increase in dose to 3 times daily, "if it is not too much to take."  He does agree with adjusting medication, as deemed appropriate by psychiatric team, which he has helped create today.  (See medication recommendations below).  Patient denies any acute psychiatric and or psychotic symptoms to include auditory and/or visual hallucinations.  His exam is without responding to internal stimuli, external stimuli, and or displaying delusional thought disorder.  Patient denies auditory and visual hallucinations.   Patient is specifically denying any suicidal or homicidal ideation.   ? ?Per chart review patient has been without any agitated behaviors, disruptive, agitation, and or aggression.  He continues to have a [employer] cabin crew at bedside.  He has been able to complete his  ADLs independently, and seemingly was discharged from occupational therapy today as he has met all of his goals.  Patient appears to be back at psychiatric baseline.  Psychiatry will reassess tomorrow following medication changes.  Suspect that patient will be psychiatrically clear at this time. ? ? ?Past Psychiatric History: MDD, anxiety.  ?Vistaril 50 mg every 4 hours needed for anxiety, Prozac 40 mg daily and trazodone 50 mg at bedtime.  ?Risk to Self: Denies ?Risk to Others: Denies ?Prior Inpatient Therapy: No ?Prior Outpatient Therapy: No ? ?Past Medical History: History reviewed. No pertinent past medical history. History reviewed. No pertinent surgical history. ?Family History: History reviewed. No pertinent family history. ? ?Family Psychiatric  History: None noted ? ?Social History:  ?Social History  ? ?Substance and Sexual Activity  ?Alcohol Use None  ?   ?Social History  ? ?Substance and Sexual Activity  ?Drug Use Not on file  ?  ?Social History  ? ?Socioeconomic History  ? Marital status: Unknown  ?  Spouse name: Not on file  ? Number of children: Not on file  ? Years of education: Not on file  ? Highest education level: Not on file  ?Occupational History  ? Not on file  ?Tobacco Use  ? Smoking status: Every Day  ?  Packs/day: 0.50  ?  Types: Cigarettes  ? Smokeless tobacco: Current  ?Substance and Sexual Activity  ? Alcohol use: Not on file  ? Drug use: Not on file  ? Sexual activity: Not on file  ?Other Topics Concern  ? Not on file  ?Social History Narrative  ? Not on file  ? ?Social Determinants of Health  ? ?Financial Resource Strain: Not on file  ?Food Insecurity: Not on file  ?Transportation Needs: Not on file  ?Physical Activity: Not on file  ?Stress: Not on file  ?Social Connections: Not on file  ? ?Additional Social History: ?  ?Essentially homeless ? ? ?Allergies:  No Known Allergies ? ?Labs:  ?Results for orders placed or performed during the hospital encounter of 01/05/21 (from the past 48  hour(s))  ?Comprehensive metabolic panel     Status: Abnormal  ? Collection Time: 05/06/21  4:14 PM  ?Result Value Ref Range  ? Sodium 138 135 - 145 mmol/L  ? Potassium 4.0 3.5 - 5.1 mmol/L  ? Chloride 107 98 - 111 mmol/L  ? CO2 24 22 - 32 mmol/L  ? Glucose, Bld 117 (H) 70 - 99 mg/dL  ?  Comment: Glucose reference range applies only to samples taken after fasting for at least 8 hours.  ? BUN 22 (H) 6 - 20 mg/dL  ? Creatinine, Ser 0.83 0.61 - 1.24 mg/dL  ? Calcium 9.3 8.9 - 10.3 mg/dL  ? Total Protein 7.6 6.5 - 8.1 g/dL  ? Albumin 4.0 3.5 - 5.0 g/dL  ? AST 106 (H) 15 - 41 U/L  ? ALT 164 (H) 0 - 44 U/L  ? Alkaline Phosphatase 40 38 - 126 U/L  ? Total Bilirubin 0.7 0.3 - 1.2 mg/dL  ? GFR, Estimated >60 >60 mL/min  ?  Comment: (NOTE) ?Calculated using the CKD-EPI Creatinine Equation (2021) ?  ? Anion gap 7 5 - 15  ?  Comment: Performed at [facility], [place] [address] [place], [place] [address]  ?CBC with Differential/Platelet     Status: Abnormal  ? Collection Time: 05/07/21  3:08 AM  ?Result Value Ref Range  ? WBC 5.9 4.0 - 10.5 K/uL  ? RBC 3.43 (L) 4.22 - 5.81 MIL/uL  ? Hemoglobin 11.6 (L) 13.0 - 17.0 g/dL  ? HCT 33.7 (L) 39.0 - 52.0 %  ? MCV 98.3 80.0 - 100.0 fL  ? MCH 33.8 26.0 - 34.0 pg  ? MCHC 34.4 30.0 - 36.0 g/dL  ? RDW 13.2 11.5 - 15.5 %  ? Platelets 163 150 - 400 K/uL  ? nRBC 0.0 0.0 - 0.2 %  ? Neutrophils Relative % 32 %  ? Neutro Abs 1.9 1.7 - 7.7 K/uL  ? Lymphocytes Relative 49 %  ? Lymphs Abs 2.9 0.7 - 4.0 K/uL  ? Monocytes Relative 14 %  ? Monocytes Absolute 0.8 0.1 - 1.0 K/uL  ? Eosinophils Relative 4 %  ? Eosinophils Absolute 0.2 0.0 - 0.5 K/uL  ? Basophils Relative 1 %  ? Basophils Absolute 0.0 0.0 - 0.1 K/uL  ? Immature Granulocytes 0 %  ? Abs Immature Granulocytes 0.01 0.00 - 0.07 K/uL  ?  Comment: Performed at [facility], [place] [address] [place], [place] [address]  ?Magnesium     Status: None  ? Collection Time: 05/07/21  3:08 AM  ?Result Value Ref Range  ? Magnesium 2.0 1.7 - 2.4  mg/dL  ?  Comment: Performed at [facility], [place] [address] [place], [place] [address]  ?Phosphorus     Status: None  ? Collection Time: 05/07/21  3:08 AM  ?Result Value Ref Range  ? Phosphorus 3.

## 2021-05-08 NOTE — Progress Notes (Signed)
Patient resting in bed; no safety sitter available at this time; bed alarm set. ?

## 2021-05-09 DIAGNOSIS — R7401 Elevation of levels of liver transaminase levels: Secondary | ICD-10-CM | POA: Diagnosis not present

## 2021-05-09 DIAGNOSIS — N179 Acute kidney failure, unspecified: Secondary | ICD-10-CM | POA: Diagnosis not present

## 2021-05-09 DIAGNOSIS — L309 Dermatitis, unspecified: Secondary | ICD-10-CM | POA: Diagnosis not present

## 2021-05-09 DIAGNOSIS — E512 Wernicke's encephalopathy: Secondary | ICD-10-CM | POA: Diagnosis not present

## 2021-05-09 LAB — COMPREHENSIVE METABOLIC PANEL
ALT: 128 U/L — ABNORMAL HIGH (ref 0–44)
AST: 77 U/L — ABNORMAL HIGH (ref 15–41)
Albumin: 4 g/dL (ref 3.5–5.0)
Alkaline Phosphatase: 38 U/L (ref 38–126)
Anion gap: 8 (ref 5–15)
BUN: 22 mg/dL — ABNORMAL HIGH (ref 6–20)
CO2: 26 mmol/L (ref 22–32)
Calcium: 9.5 mg/dL (ref 8.9–10.3)
Chloride: 105 mmol/L (ref 98–111)
Creatinine, Ser: 0.81 mg/dL (ref 0.61–1.24)
GFR, Estimated: >60 mL/min (ref >60)
Glucose, Bld: 101 mg/dL — ABNORMAL HIGH (ref 70–99)
Potassium: 4.1 mmol/L (ref 3.5–5.1)
Sodium: 139 mmol/L (ref 135–145)
Total Bilirubin: 0.8 mg/dL (ref 0.3–1.2)
Total Protein: 7.6 g/dL (ref 6.5–8.1)

## 2021-05-09 LAB — CBC WITH DIFFERENTIAL/PLATELET
Abs Immature Granulocytes: 0.02 10*3/uL (ref 0.00–0.07)
Basophils Absolute: 0 10*3/uL (ref 0.0–0.1)
Basophils Relative: 1 %
Eosinophils Absolute: 0.2 10*3/uL (ref 0.0–0.5)
Eosinophils Relative: 4 %
HCT: 39.3 % (ref 39.0–52.0)
Hemoglobin: 13.8 g/dL (ref 13.0–17.0)
Immature Granulocytes: 0 %
Lymphocytes Relative: 44 %
Lymphs Abs: 2.5 10*3/uL (ref 0.7–4.0)
MCH: 34.5 pg — ABNORMAL HIGH (ref 26.0–34.0)
MCHC: 35.1 g/dL (ref 30.0–36.0)
MCV: 98.3 fL (ref 80.0–100.0)
Monocytes Absolute: 0.6 10*3/uL (ref 0.1–1.0)
Monocytes Relative: 11 %
Neutro Abs: 2.3 10*3/uL (ref 1.7–7.7)
Neutrophils Relative %: 40 %
Platelets: 181 10*3/uL (ref 150–400)
RBC: 4 MIL/uL — ABNORMAL LOW (ref 4.22–5.81)
RDW: 13.1 % (ref 11.5–15.5)
WBC: 5.7 10*3/uL (ref 4.0–10.5)
nRBC: 0 % (ref 0.0–0.2)

## 2021-05-09 LAB — PHOSPHORUS: Phosphorus: 3.4 mg/dL (ref 2.5–4.6)

## 2021-05-09 LAB — MAGNESIUM: Magnesium: 2 mg/dL (ref 1.7–2.4)

## 2021-05-09 MED ORDER — CLONAZEPAM 0.5 MG PO TABS
1.0000 mg | ORAL_TABLET | Freq: Once | ORAL | Status: AC
  Administered 2021-05-09: 1 mg via ORAL
  Filled 2021-05-09: qty 2

## 2021-05-09 MED ORDER — RISPERIDONE 0.5 MG PO TABS
1.0000 mg | ORAL_TABLET | Freq: Every day | ORAL | Status: DC
  Administered 2021-05-10 – 2021-05-13 (×4): 1 mg via ORAL
  Filled 2021-05-09 (×4): qty 2

## 2021-05-09 MED ORDER — HALOPERIDOL LACTATE 5 MG/ML IJ SOLN: 2.0000 mg | Freq: Four times a day (QID) | INTRAMUSCULAR | Status: DC | PRN

## 2021-05-09 NOTE — Progress Notes (Signed)
?PROGRESS NOTE ? ? ? ?Steve [name]  VQQ:[id number] DOB: 1965-03-17 DOA: 01/05/2021 ?PCP: No primary care provider on file.  ? ?Brief Narrative:  ?Steve [name] is a 56 y.o. male with history of major depression, anxiety who was in a residential ETOH treatment center for last 5 months. He was brought to the ED on 12/20 from a residential ETOH treatment center for convalescence, combativeness and altered sensorium.  Per collateral history, patient was not acting typical for last few days. ? ?In the ED, patient was confused, combative. Labs showed AKI with creatinine of 3.27, rhabdomyolysis, abnormal LFTs, elevated WBC count at 23.7 and lactic acid level at 7.6. 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 Blood culture was sent, empiric antibiotic started.  IV fluid resuscitation started and patient was initially admitted to ICU. ?12/21, underwent LP which showed staph in CSF, felt to be contaminant.  EEG did not show any seizures ?12/24, HD catheter was placed but patient did not require HD as creatinine started to improve ?12/31, with clinical improvement, patient was transferred out of ICU to [facility]. ?1/18 Cognitive evaluations: 2 on the SLUMS, a 9 on the MMSE, and a 0 on medi-cog and short blessed test ? ? ?On 05/04/2021 he became very agitated and was given additional dose of Klonopin. Additionally he became agitated and started threatening to Leave AMA again on 05/07/21, on 05/07/2021 and again today.  Psychiatry has been consulted for further evaluation recommendations given that he continues to have wandering behaviors. His SLUMS score is improved to 24 out of 30.  Psychiatry has made some further recommendations and recommending continuing Klonopin 0.5 mg p.o. 3 times daily, Prozac 40 mg p.o. daily, melatonin 10 mg nightly, and they have stopped the trazodone 100 mg p.o. nightly.  Psychiatry  recommending continue Depakene 750 and 1000 mg as well as Risperdal 1 mg in the morning and increase Risperdal to 2 mg nightly.  Psychiatry also recommends continuing Benadryl 50 mg nightly to prevent EPS and continue gabapentin 200 g 3 times daily for neuropathic pain and anxiety have also started the patient on ferrous sulfate 325 mg p.o. nightly for anemia and possible RLS symptoms. ? ?Currently awaiting A locked unit in a Memory Care Unit and but still has no bed offers and still awaiting disability and long-term Medicaid.  Several barriers for his memory care unit placement given that he is young, has no income and has a history of alcohol use.  ? ? ?Assessment and Plan: ?* Cognitive impairment  2/2 Wernicke's encephalopathy and alcoholic dementia ?-Initial MRI revealed tiny acute infarct on the right but not significant enough to explain persistent cognitive and behavioral abnormalities. Also evidence of prior lacunar infarct right basal ganglia ?Cognitive screening revealed significant cognitive deficits ?-Appreciate psychiatric team assistance.  Continue Risperdal, Prozac, trazodone, Depakote and melatonin.  ?-Continue sitter to allow patient to leave the room in the unit with supervision ?-Repeat SLUMS score was 24/30 on 04/21/21 ?-Given wandering behaviors and threats again will re-consult Pscyh and they will see the patient tomorrow ? ?-Had to be given another dose of Klonopin 1 mg x1 on 05/04/21 and on 05/07/21 ? ?**Please note that sitter is being utilized in the acute hospital setting noting in the hospital setting we are unable to provide the same physical barriers as a locked unit and therefore for the patient's safety until patient is discharged he will need  to require a sitter and a locked unit due to his recurrent wandering behaviors which increase at night. ? ?After discharge recommendations: PLEASE NOTE THAT THIS PT'S REGIMEN OF PSYCHOTROPIC MEDS CAN NEVER BE WEANED AND DC'D DUE TO SEVERITY OF HIS  WERNICKE'S DEMENTIA but will have Psych re-evaluate and they have made several medication changes and [facility] ? ?-Continue klonopin 0.5mg  TID ?-Continue Prozac 40mg  ?-Continue Melatonin 10mg  QHS ?- Discontinued Trazodone 100mg  QHS due to patient complaint of unsteady gait and hangover effect, last dose 05/07/2021 ?-Continue Depakene 750 and 1000 mg.  Last Depakote level drawn on 04/23/2021 was 60. ?- Continue Risperdal 1mg  in the morning and increase Risperdal to 2 mg at bedtime 05/08/2021 ?-Continue Benadryl to 50 mg scheduled at bedtime to prevent EPS ?-Continue gabapentin 200 mg 3 times daily for neuropathic pain and anxiety ?-Continue ferrous sulfate 325 mg at bedtime for anemia and possible RLS symptoms ? ? ?Insomnia ?-Discontinued Trazodone 100 mg po qHS was changed to Benadryl 50 mg p.o. nightly and he is also having melatonin 2 mg p.o. nightly ?-Continue to Monitor and Trend and will add Benadryl qHS ?-Appreciate psychiatric evaluation and recommendations ? ?Constipation ?-Resolved ? ?Eczema ?-Resolved with use of topical cortisone cream but has reemerged on his chest.  Hopeful that current prednisone will help.   ?-After prednisone completed will likely need to resume topical cortisone cream at a low dose and this was resumed with topical cortisone to all areas on extremities, abdomen and back  ? ?Protein calorie malnutrition ([name]) ?Patient able to feed self independently and is eating well ? ?Witnessed seizure ([name]) ?-No apparent prior history of seizure disorder noting patient was not on AEDs prior to admission ?-Unclear if withdrawal seizure or related to other substances ?-3/10 given recent behavioral changes attending felt EEG needed to be completed to rule out atypical seizure activity causing behavioral manifestations.  EEG unremarkable other than for moderate diffuse encephalopathy.  No seizure activity. ?-Continue Levetiracetam 500 mg po BID and Valproic Acid 1000  mg qHS and 750 mg qMorning Daily at recommendation of Neurology ? ?Right ventricular dysfunction ?-Echo 1/2 with EF 60 to 65%.  Cannot rule out a small PFO this finding determined to be clinically insignificant given the patient was asymptomatic ? ?Physical deconditioning 2/2 ambulatory dysfunction ?Patient now ambulating without difficulty ? ?A physical therapy consult is indicated based on the patient?s mobility assessment. ? ?Mobility Assessment (last 72 hours)   ? ? Mobility Assessment   ? ? Row Name 05/09/21 0919 05/07/21 2030  ?  ?  ?  ? Does patient have an order for bedrest or is patient medically unstable No - Continue assessment No - Continue assessment     ? What is the highest level of mobility based on the progressive mobility assessment? Level 6 (Walks independently in room and hall) - Balance while walking in room without assist - Complete Level 6 (Walks independently in room and hall) - Balance while walking in room without assist - Complete     ? Is the above level different from baseline mobility prior to current illness? No - Consider discontinuing PT/OT No - Consider discontinuing PT/OT     ? ?  ?  ? ?  ? ? ?Skin abnormalities/perineal MASD ?-Resolved ? ?Elevated transaminases at time of presentation-resolved as of 03/04/2021 ?-Likely secondary was 2/2 sepsis like physiology at presentation ?-His AST and ALT were significantly elevated to over 700s on admission.  ?-Re emergence mild elevation  LFTs-likely due to Zyprexa and Depakote ?-Due to oversedation Zyprexa dose decreased ?-LFTs had normalized with decrease in Zyprexa dose but now LFTs are slightly worsening again.  ?-2/16 valproic acid level 55 ?-AST has gone from 89 -> 106 -> 78 -> 84  -> 77 and ALT has gone from 143 -> 164 -> 127 -> 122 -> 128 on last check  ?-Continue to Monitor and Trend and Repeat CMP in the AM  ? ?Aspiration pneumonia ([name])-resolved as of 03/04/2021 ?-Completed a course of Zosyn ? ?Possible DVT (deep venous thrombosis)  ([name])-resolved as of 03/17/2021 ?-Per critical care note, hemodialysis cath had a clot on it when it was removed.  UE duplex  12/31: no DVT.   ? ?Acute kidney injury with rhabdomyolysis-resolved as of 03/17/2021

## 2021-05-09 NOTE — Consult Note (Addendum)
Inpatient medicine Face-to-Face Psychiatry Consult  ? ?Reason for Consult:Severe night time agitation ?Referring Physician:  Raiford Noble ?Patient Identification: Steve [name] ?MRN:  [id number] ?Principal Diagnosis: Wernicke's encephalopathy ?Diagnosis:  Principal Problem: ?  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  Skin abnormalities/perineal MASD ?  Physical deconditioning 2/2 ambulatory dysfunction ?  Right ventricular dysfunction ?  Eczema ?  Pressure injury of skin ?  Constipation ?  Insomnia ? ? ?Total Time Spent in Direct Patient Care:  ?I personally spent 35 minutes on the unit in direct patient care. The direct patient care time included face-to-face time with the patient, reviewing the patient's chart, communicating with other professionals, and coordinating care. Greater than 50% of this time was spent in counseling or coordinating care with the patient regarding goals of hospitalization, psycho-education, and discharge planning needs. ? ? ?HPI:   Yaron Grasse is a 56 y.o. male with history of major depression, anxiety who was in a residential ETOH treatment center for last 5 months. He was brought to the ED on 12/20 from a residential ETOH treatment center for convalescence, combativeness and altered sensorium.  Per collateral history, patient was not acting typical for last few days. ? ?Subjective: " I feel more awake and alert, but I woke up 10 times last night." ? ?Patient is seen today.  Patient is awake and alert x3, sitting on side of bed. He stands and ambulates some and appears more steady on his feet.  He does not have a bedside sitter today. Patient is unable to verbalize what awakened him last night, but denies feeling sleepy today.  Nursing staff available for collateral report that he slept well, but had an early morning awakening when he began to wander in his room.  He notes that this is typical for this patient.  Patient states that  date as Sunday 05/05/2021, which is more oriented than his usual.   He is aware of discharge planning in place.  ?Patient reports his mood as "good".  He is describes a good appetite.  He has been compliant with medication. He tolerated changes in medications yesterday and reports good effect.  ?Patient denies any physical complaints at this time.  Patient denies any acute psychiatric and or psychotic symptoms to include auditory and/or visual hallucinations.  His exam is without responding to internal stimuli, external stimuli, and or displaying delusional thought disorder. Patient is specifically denying any suicidal or homicidal ideation.   ? ?Per chart review patient has been without any agitated behaviors, disruptive, agitation, and or aggression.  He continues to have a Air cabin crew at bedside PRN.  He has been able to complete his ADLs independently, and seemingly was discharged from occupational therapy today as he has met all of his goals.  Patient appears to be back at psychiatric baseline.  Patient is psychiatrically cleared at this time. ? ? ?Past Psychiatric History: MDD, anxiety.  ?Vistaril 50 mg every 4 hours needed for anxiety, Prozac 40 mg daily and trazodone 50 mg at bedtime.  ?Risk to Self: Denies ?Risk to Others: Denies ?Prior Inpatient Therapy: No ?Prior Outpatient Therapy: No ? ?Past Medical History: History reviewed. No pertinent past medical history. History reviewed. No pertinent surgical history. ?Family History: History reviewed. No pertinent family history. ? ?Family Psychiatric  History: None noted ? ?Social History:  ?Social History  ? ?Substance and Sexual Activity  ?Alcohol Use None  ?   ?Social History  ? ?Substance and Sexual Activity  ?  Drug Use Not on file  ?  ?Social History  ? ?Socioeconomic History  ? Marital status: Unknown  ?  Spouse name: Not on file  ? Number of children: Not on file  ? Years of education: Not on file  ? Highest education level: Not on file  ?Occupational  History  ? Not on file  ?Tobacco Use  ? Smoking status: Every Day  ?  Packs/day: 0.50  ?  Types: Cigarettes  ? Smokeless tobacco: Current  ?Substance and Sexual Activity  ? Alcohol use: Not on file  ? Drug use: Not on file  ? Sexual activity: Not on file  ?Other Topics Concern  ? Not on file  ?Social History Narrative  ? Not on file  ? ?Social Determinants of Health  ? ?Financial Resource Strain: Not on file  ?Food Insecurity: Not on file  ?Transportation Needs: Not on file  ?Physical Activity: Not on file  ?Stress: Not on file  ?Social Connections: Not on file  ? ?Additional Social History: ?  ?Essentially homeless ? ? ?Allergies:  No Known Allergies ? ?Labs:  ?Results for orders placed or performed during the hospital encounter of 01/05/21 (from the past 48 hour(s))  ?Comprehensive metabolic panel     Status: Abnormal  ? Collection Time: 05/07/21  4:49 PM  ?Result Value Ref Range  ? Sodium 139 135 - 145 mmol/L  ? Potassium 4.5 3.5 - 5.1 mmol/L  ?  Comment: SLIGHT HEMOLYSIS  ? Chloride 108 98 - 111 mmol/L  ? CO2 23 22 - 32 mmol/L  ? Glucose, Bld 104 (H) 70 - 99 mg/dL  ?  Comment: Glucose reference range applies only to samples taken after fasting for at least 8 hours.  ? BUN 19 6 - 20 mg/dL  ? Creatinine, Ser 0.68 0.61 - 1.24 mg/dL  ? Calcium 8.7 (L) 8.9 - 10.3 mg/dL  ? Total Protein 6.3 (L) 6.5 - 8.1 g/dL  ? Albumin 3.3 (L) 3.5 - 5.0 g/dL  ? AST 84 (H) 15 - 41 U/L  ? ALT 122 (H) 0 - 44 U/L  ? Alkaline Phosphatase 33 (L) 38 - 126 U/L  ? Total Bilirubin 0.9 0.3 - 1.2 mg/dL  ? GFR, Estimated >60 >60 mL/min  ?  Comment: (NOTE) ?Calculated using the CKD-EPI Creatinine Equation (2021) ?  ? Anion gap 8 5 - 15  ?  Comment: Performed at [facility], [place] [address] [place], [place] [address]  ?CBC with Differential/Platelet     Status: Abnormal  ? Collection Time: 05/08/21  1:42 AM  ?Result Value Ref Range  ? WBC 5.5 4.0 - 10.5 K/uL  ? RBC 3.48 (L) 4.22 - 5.81 MIL/uL  ? Hemoglobin 12.1 (L) 13.0 - 17.0 g/dL  ? HCT  34.4 (L) 39.0 - 52.0 %  ? MCV 98.9 80.0 - 100.0 fL  ? MCH 34.8 (H) 26.0 - 34.0 pg  ? MCHC 35.2 30.0 - 36.0 g/dL  ? RDW 13.2 11.5 - 15.5 %  ? Platelets 158 150 - 400 K/uL  ? nRBC 0.0 0.0 - 0.2 %  ? Neutrophils Relative % 32 %  ? Neutro Abs 1.8 1.7 - 7.7 K/uL  ? Lymphocytes Relative 49 %  ? Lymphs Abs 2.7 0.7 - 4.0 K/uL  ? Monocytes Relative 14 %  ? Monocytes Absolute 0.7 0.1 - 1.0 K/uL  ? Eosinophils Relative 4 %  ? Eosinophils Absolute 0.2 0.0 - 0.5 K/uL  ? Basophils Relative 1 %  ?  Basophils Absolute 0.0 0.0 - 0.1 K/uL  ? Immature Granulocytes 0 %  ? Abs Immature Granulocytes 0.01 0.00 - 0.07 K/uL  ?  Comment: Performed at [facility], [place] [address] [place], [place] [address]  ?Phosphorus     Status: None  ? Collection Time: 05/08/21  1:42 AM  ?Result Value Ref Range  ? Phosphorus 3.6 2.5 - 4.6 mg/dL  ?  Comment: Performed at [facility], [place] [address], [place], [place] [address]  ?Magnesium     Status: None  ? Collection Time: 05/08/21  1:42 AM  ?Result Value Ref Range  ? Magnesium 2.0 1.7 - 2.4 mg/dL  ?  Comment: Performed at [facility], [place] [address] [place], [place] [address]  ?Uric acid     Status: None  ? Collection Time: 05/08/21  1:42 AM  ?Result Value Ref Range  ? Uric Acid, Serum 3.8 3.7 - 8.6 mg/dL  ?  Comment: Performed at [facility], [place] [name] [place], [place] [address]  ?CBC with Differential/Platelet     Status: Abnormal  ? Collection Time: 05/09/21  8:13 AM  ?Result Value Ref Range  ? WBC 5.7 4.0 - 10.5 K/uL  ? RBC 4.00 (L) 4.22 - 5.81 MIL/uL  ? Hemoglobin 13.8 13.0 - 17.0 g/dL  ? HCT 39.3 39.0 - 52.0 %  ? MCV 98.3 80.0 - 100.0 fL  ? MCH 34.5 (H) 26.0 - 34.0 pg  ? MCHC 35.1 30.0 - 36.0 g/dL  ? RDW 13.1 11.5 - 15.5 %  ? Platelets 181 150 - 400 K/uL  ? nRBC 0.0 0.0 - 0.2 %  ? Neutrophils Relative % 40 %  ? Neutro Abs 2.3 1.7 - 7.7 K/uL  ? Lymphocytes Relative 44 %  ? Lymphs Abs 2.5 0.7 - 4.0 K/uL  ? Monocytes Relative 11 %  ? Monocytes Absolute 0.6 0.1 - 1.0  K/uL  ? Eosinophils Relative 4 %  ? Eosinophils Absolute 0.2 0.0 - 0.5 K/uL  ? Basophils Relative 1 %  ? Basophils Absolute 0.0 0.0 - 0.1 K/uL  ? Immature Granulocytes 0 %  ? Abs Immature Granulocytes 0.02 0

## 2021-05-10 LAB — COMPREHENSIVE METABOLIC PANEL
ALT: 127 U/L — ABNORMAL HIGH (ref 0–44)
AST: 76 U/L — ABNORMAL HIGH (ref 15–41)
Albumin: 4.1 g/dL (ref 3.5–5.0)
Alkaline Phosphatase: 40 U/L (ref 38–126)
Anion gap: 6 (ref 5–15)
BUN: 24 mg/dL — ABNORMAL HIGH (ref 6–20)
CO2: 30 mmol/L (ref 22–32)
Calcium: 9.5 mg/dL (ref 8.9–10.3)
Chloride: 106 mmol/L (ref 98–111)
Creatinine, Ser: 0.87 mg/dL (ref 0.61–1.24)
GFR, Estimated: >60 mL/min (ref >60)
Glucose, Bld: 101 mg/dL — ABNORMAL HIGH (ref 70–99)
Potassium: 3.9 mmol/L (ref 3.5–5.1)
Sodium: 142 mmol/L (ref 135–145)
Total Bilirubin: 0.6 mg/dL (ref 0.3–1.2)
Total Protein: 7.9 g/dL (ref 6.5–8.1)

## 2021-05-10 LAB — MAGNESIUM: Magnesium: 2.1 mg/dL (ref 1.7–2.4)

## 2021-05-10 LAB — CBC WITH DIFFERENTIAL/PLATELET
Abs Immature Granulocytes: 0.01 10*3/uL (ref 0.00–0.07)
Basophils Absolute: 0 10*3/uL (ref 0.0–0.1)
Basophils Relative: 1 %
Eosinophils Absolute: 0.2 10*3/uL (ref 0.0–0.5)
Eosinophils Relative: 5 %
HCT: 39.2 % (ref 39.0–52.0)
Hemoglobin: 13.1 g/dL (ref 13.0–17.0)
Immature Granulocytes: 0 %
Lymphocytes Relative: 34 %
Lymphs Abs: 1.8 10*3/uL (ref 0.7–4.0)
MCH: 33.3 pg (ref 26.0–34.0)
MCHC: 33.4 g/dL (ref 30.0–36.0)
MCV: 99.7 fL (ref 80.0–100.0)
Monocytes Absolute: 0.6 10*3/uL (ref 0.1–1.0)
Monocytes Relative: 12 %
Neutro Abs: 2.5 10*3/uL (ref 1.7–7.7)
Neutrophils Relative %: 48 %
Platelets: 181 10*3/uL (ref 150–400)
RBC: 3.93 MIL/uL — ABNORMAL LOW (ref 4.22–5.81)
RDW: 13.1 % (ref 11.5–15.5)
WBC: 5.1 10*3/uL (ref 4.0–10.5)
nRBC: 0 % (ref 0.0–0.2)

## 2021-05-10 LAB — PHOSPHORUS: Phosphorus: 3.6 mg/dL (ref 2.5–4.6)

## 2021-05-10 NOTE — Progress Notes (Addendum)
1pm: ?Patient declined admission to [facility] facility. ? ?11:45am: ?CSW spoke with Tammy who states she will inquire with the [facility] facility to determine if beds are available in the locked unit. ? ?9:50am: ?CSW attempted to reach [facility]my [facility] without success - a voicemail was left requesting a return call. ? ?Madilyn Fireman, MSW, LCSW ?Transitions of Care  Clinical Social Worker II ?[phone] ? ?

## 2021-05-10 NOTE — Progress Notes (Signed)
?PROGRESS NOTE ? ? ? ?Steve [name]  [id number] DOB: 14-Feb-1965 DOA: 01/05/2021 ?PCP: No primary care provider on file.  ? ?Brief Narrative:  ?Steve [name] is a 56 y.o. male with history of major depression, anxiety who was in a residential ETOH treatment center for last 5 months. He was brought to the ED on 12/20 from a residential ETOH treatment center for convalescence, combativeness and altered sensorium.  Per collateral history, patient was not acting typical for last few days. ? ?In the ED, patient was confused, combative. Labs showed AKI with creatinine of 3.27, rhabdomyolysis, abnormal LFTs, elevated WBC count at 23.7 and lactic acid level at 7.6. 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 Blood culture was sent, empiric antibiotic started.  IV fluid resuscitation started and patient was initially admitted to ICU. ?12/21, underwent LP which showed staph in CSF, felt to be contaminant.  EEG did not show any seizures ?12/24, HD catheter was placed but patient did not require HD as creatinine started to improve ?12/31, with clinical improvement, patient was transferred out of ICU to [facility]. ?1/18 Cognitive evaluations: 2 on the SLUMS, a 9 on the MMSE, and a 0 on medi-cog and short blessed test ? ? ?On 05/04/2021 he became very agitated and was given additional dose of Klonopin. Additionally he became agitated and started threatening to Leave AMA again on 05/07/21, on 05/07/2021 and again today.  Psychiatry has been consulted for further evaluation recommendations given that he continues to have wandering behaviors. His SLUMS score is improved to 24 out of 30.  Psychiatry has made some further recommendations and recommending continuing Klonopin 0.5 mg p.o. 3 times daily, Prozac 40 mg p.o. daily, melatonin 10 mg nightly, and they have stopped the trazodone 100 mg p.o. nightly.  Psychiatry  recommending continue Depakene 750 and 1000 mg as well as Risperdal 1 mg in the morning and increase Risperdal to 2 mg nightly.  Psychiatry also recommends continuing Benadryl 50 mg nightly to prevent EPS and continue gabapentin 200 g 3 times daily for neuropathic pain and anxiety have also started the patient on ferrous sulfate 325 mg p.o. nightly for anemia and possible RLS symptoms. ? ?Because he continues to be agitated in the afternoon and ambulating to the desk and trying to leave Psychiatry recommended adding Risperdal 1 mg after lunch. ? ?Currently awaiting A locked unit in a Memory Care Unit and but still has no bed offers and still awaiting disability and long-term Medicaid.  Several barriers for his memory care unit placement given that he is young, has no income and has a history of alcohol use.  ? ?Assessment and Plan: ?* Cognitive impairment  2/2 Wernicke's encephalopathy and alcoholic dementia ?-Initial MRI revealed tiny acute infarct on the right but not significant enough to explain persistent cognitive and behavioral abnormalities. Also evidence of prior lacunar infarct right basal ganglia ?Cognitive screening revealed significant cognitive deficits ?-Appreciate psychiatric team assistance.  Continue Risperdal, Prozac, trazodone, Depakote and melatonin.  ?-Continue sitter to allow patient to leave the room in the unit with supervision ?-Repeat SLUMS score was 24/30 on 04/21/21 ?-Given wandering behaviors and threats again will re-consult Pscyh and they will see the patient tomorrow ? ?-Had to be given another dose of Klonopin 1 mg x1 on 05/04/21 and on 05/07/21 ? ?**Please note that sitter is being utilized in the acute hospital setting noting in the hospital setting  we are unable to provide the same physical barriers as a locked unit and therefore for the patient's safety until patient is discharged he will need to require a sitter and a locked unit due to his recurrent wandering behaviors which  increase at night. ? ?After discharge recommendations: PLEASE NOTE THAT THIS PT'S REGIMEN OF PSYCHOTROPIC MEDS CAN NEVER BE WEANED AND DC'D DUE TO SEVERITY OF HIS [place] but will have Psych re-evaluate and they have made several medication changes and recommending his current regimen from [facility]: ? ?-Continue klonopin 0.5mg  TID ?-Continue Prozac 40mg  ?-Continue Melatonin 10mg  QHS ?- Discontinued Trazodone 100mg  QHS due to patient complaint of unsteady gait and hangover effect, last dose 05/07/2021 ?-Continue Depakene 750 and 1000 mg.  Last Depakote level drawn on 04/23/2021 was 60. ?- Continue Risperdal 1mg  in the morning and increase Risperdal to 2 mg at bedtime 05/08/2021 ?-Continue Benadryl to 50 mg scheduled at bedtime to prevent EPS ?-Continue gabapentin 200 mg 3 times daily for neuropathic pain and anxiety ?-Continue ferrous sulfate 325 mg at bedtime for anemia and possible RLS symptoms ?-Psych recommends adding Risperdal 1 mg po after Lunch  ? ? ?Insomnia ?-Discontinued Trazodone 100 mg po qHS was changed to Benadryl 50 mg p.o. nightly and he is also having melatonin 2 mg p.o. nightly ?-Continue to Monitor and Trend and will add Benadryl qHS ?-Appreciate psychiatric evaluation and recommendations and evaluation ? ?Constipation ?-Resolved ? ?Eczema ?-Resolved with use of topical cortisone cream but has reemerged on his chest.  Hopeful that current prednisone will help.   ?-After prednisone completed will likely need to resume topical cortisone cream at a low dose and this was resumed with topical cortisone to all areas on extremities, abdomen and back  ? ?Protein calorie malnutrition ([place]) ?Patient able to feed self independently and is eating well ? ?Witnessed seizure ([place]) ?-No apparent prior history of seizure disorder noting patient was not on AEDs prior to admission ?-Unclear if withdrawal seizure or related to other substances ?-3/10 given recent behavioral changes attending felt EEG needed to be  completed to rule out atypical seizure activity causing behavioral manifestations.  EEG unremarkable other than for moderate diffuse encephalopathy.  No seizure activity. ?-Continue Levetiracetam 500 mg po BID and Valproic Acid 1000 mg qHS and 750 mg qMorning Daily at recommendation of Neurology ? ?Right ventricular dysfunction ?-Echo 1/2 with EF 60 to 65%.  Cannot rule out a small PFO this finding determined to be clinically insignificant given the patient was asymptomatic ? ?Physical deconditioning 2/2 ambulatory dysfunction ?Patient now ambulating without difficulty ? ?A physical therapy consult is indicated based on the patient?s mobility assessment. ? ?Mobility Assessment (last 72 hours)   ? ? Mobility Assessment   ? ? [place] Name 05/09/21 0919 05/07/21 2030  ?  ?  ?  ? Does patient have an order for bedrest or is patient medically unstable No - Continue assessment No - Continue assessment     ? What is the highest level of mobility based on the progressive mobility assessment? Level 6 (Walks independently in room and hall) - Balance while walking in room without assist - Complete Level 6 (Walks independently in room and hall) - Balance while walking in room without assist - Complete     ? Is the above level different from baseline mobility prior to current illness? No - Consider discontinuing PT/OT No - Consider discontinuing PT/OT     ? ?  ?  ? ?  ? ? ?Skin abnormalities/perineal  MASD ?-Resolved ? ?Elevated transaminases at time of presentation-resolved as of 03/04/2021 ?-Likely secondary was 2/2 sepsis like physiology at presentation ?-His AST and ALT were significantly elevated to over 700s on admission.  ?-Re emergence mild elevation LFTs-likely due to Zyprexa and Depakote ?-Due to oversedation Zyprexa dose decreased ?-LFTs had normalized with decrease in Zyprexa dose but now LFTs are slightly worsening again.  ?-2/16 valproic acid level 55 ?-AST has gone from 89 -> 106 -> 78 -> 84  -> 77 -> 76 and ALT has  gone from 143 -> 164 -> 127 -> 122 -> 128 -> 127 on last check  ?-Continue to Monitor and Trend and Repeat CMP in the AM  ? ?Aspiration pneumonia ([name])-resolved as of 03/04/2021 ?-Completed a course of Zosyn ? ?Po

## 2021-05-11 DIAGNOSIS — R7401 Elevation of levels of liver transaminase levels: Secondary | ICD-10-CM | POA: Diagnosis not present

## 2021-05-11 DIAGNOSIS — L309 Dermatitis, unspecified: Secondary | ICD-10-CM | POA: Diagnosis not present

## 2021-05-11 DIAGNOSIS — N179 Acute kidney failure, unspecified: Secondary | ICD-10-CM | POA: Diagnosis not present

## 2021-05-11 DIAGNOSIS — E512 Wernicke's encephalopathy: Secondary | ICD-10-CM | POA: Diagnosis not present

## 2021-05-11 LAB — COMPREHENSIVE METABOLIC PANEL
ALT: 113 U/L — ABNORMAL HIGH (ref 0–44)
AST: 73 U/L — ABNORMAL HIGH (ref 15–41)
Albumin: 3.5 g/dL (ref 3.5–5.0)
Alkaline Phosphatase: 36 U/L — ABNORMAL LOW (ref 38–126)
Anion gap: 5 (ref 5–15)
BUN: 25 mg/dL — ABNORMAL HIGH (ref 6–20)
CO2: 26 mmol/L (ref 22–32)
Calcium: 8.9 mg/dL (ref 8.9–10.3)
Chloride: 109 mmol/L (ref 98–111)
Creatinine, Ser: 0.74 mg/dL (ref 0.61–1.24)
GFR, Estimated: >60 mL/min (ref >60)
Glucose, Bld: 85 mg/dL (ref 70–99)
Potassium: 3.9 mmol/L (ref 3.5–5.1)
Sodium: 140 mmol/L (ref 135–145)
Total Bilirubin: 0.4 mg/dL (ref 0.3–1.2)
Total Protein: 6.8 g/dL (ref 6.5–8.1)

## 2021-05-11 LAB — CBC WITH DIFFERENTIAL/PLATELET
Abs Immature Granulocytes: 0.03 10*3/uL (ref 0.00–0.07)
Basophils Absolute: 0 10*3/uL (ref 0.0–0.1)
Basophils Relative: 1 %
Eosinophils Absolute: 0.3 10*3/uL (ref 0.0–0.5)
Eosinophils Relative: 5 %
HCT: 34.3 % — ABNORMAL LOW (ref 39.0–52.0)
Hemoglobin: 12.2 g/dL — ABNORMAL LOW (ref 13.0–17.0)
Immature Granulocytes: 1 %
Lymphocytes Relative: 53 %
Lymphs Abs: 3.1 10*3/uL (ref 0.7–4.0)
MCH: 34.6 pg — ABNORMAL HIGH (ref 26.0–34.0)
MCHC: 35.6 g/dL (ref 30.0–36.0)
MCV: 97.2 fL (ref 80.0–100.0)
Monocytes Absolute: 0.7 10*3/uL (ref 0.1–1.0)
Monocytes Relative: 13 %
Neutro Abs: 1.5 10*3/uL — ABNORMAL LOW (ref 1.7–7.7)
Neutrophils Relative %: 27 %
Platelets: 166 10*3/uL (ref 150–400)
RBC: 3.53 MIL/uL — ABNORMAL LOW (ref 4.22–5.81)
RDW: 13 % (ref 11.5–15.5)
WBC: 5.7 10*3/uL (ref 4.0–10.5)
nRBC: 0 % (ref 0.0–0.2)

## 2021-05-11 LAB — PHOSPHORUS: Phosphorus: 3.9 mg/dL (ref 2.5–4.6)

## 2021-05-11 LAB — MAGNESIUM: Magnesium: 2 mg/dL (ref 1.7–2.4)

## 2021-05-11 MED ORDER — MELATONIN 5 MG PO TABS: 5.0000 mg | ORAL_TABLET | Freq: Every evening | ORAL | Status: DC | PRN

## 2021-05-11 MED ORDER — MELATONIN 5 MG PO TABS
10.0000 mg | ORAL_TABLET | Freq: Every day | ORAL | Status: DC
  Administered 2021-05-11 – 2021-05-13 (×3): 10 mg via ORAL
  Filled 2021-05-11 (×3): qty 2

## 2021-05-11 NOTE — Progress Notes (Signed)
CSW spoke with Adella Nissen at [facility] to request the facility reconsider this patient for possible admission to the facility. CSW sent new clinicals via HUB for review. ? ?Edwin Dada, MSW, LCSW ?Transitions of Care  Clinical Social Worker II ?[phone] ? ?

## 2021-05-11 NOTE — Progress Notes (Addendum)
?PROGRESS NOTE ? ? ? ?Steve [name]  [id number] DOB: [birth year][name][birth year] DOA: 01/05/2021 ?PCP: No primary care provider on file.  ? ?Brief Narrative:  ?Steve SaverJames [name] is a 56 y.o. male with history of major depression, anxiety who was in a residential ETOH treatment center for last 5 months. He was brought to the ED on 12/20 from a residential ETOH treatment center for convalescence, combativeness and altered sensorium.  Per collateral history, patient was not acting typical for last few days. ? ?In the ED, patient was confused, combative. Labs showed AKI with creatinine of 3.27, rhabdomyolysis, abnormal LFTs, elevated WBC count at 23.7 and lactic acid level at 7.6. 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 Blood culture was sent, empiric antibiotic started.  IV fluid resuscitation started and patient was initially admitted to ICU. ?12/21, underwent LP which showed staph in CSF, felt to be contaminant.  EEG did not show any seizures ?12/24, HD catheter was placed but patient did not require HD as creatinine started to improve ?12/31, with clinical improvement, [facility] ?1/18 Cognitive evaluations: 2 on the SLUMS, a 9 on the MMSE, and a 0 on medi-cog and short blessed test ? ? ?On 05/04/2021 he became very agitated and was given additional dose of Klonopin. Additionally he became agitated and started threatening to Leave AMA again on 05/07/21, on 05/07/2021 and again today.  Psychiatry has been consulted for further evaluation recommendations given that he continues to have wandering behaviors. His SLUMS score is improved to 24 out of 30.  Psychiatry has made some further recommendations and recommending continuing Klonopin 0.5 mg p.o. 3 times daily, Prozac 40 mg p.o. daily, melatonin 10 mg nightly, and they have stopped the trazodone 100 mg p.o. nightly.  Psychiatry  recommending continue Depakene 750 and 1000 mg as well as Risperdal 1 mg in the morning and increase Risperdal to 2 mg nightly.  Psychiatry also recommends continuing Benadryl 50 mg nightly to prevent EPS and continue gabapentin 200 g 3 times daily for neuropathic pain and anxiety have also started the patient on ferrous sulfate 325 mg p.o. nightly for anemia and possible RLS symptoms. ? ?Because he continues to be agitated in the afternoon and ambulating to the desk and trying to leave Psychiatry recommended adding Risperdal 1 mg after lunch. ? ?Currently awaiting A locked unit in a Memory Care Unit and but still has no bed offers and still awaiting disability and long-term Medicaid.  Several barriers for his memory care unit placement given that he is young, has no income and has a history of alcohol use.  ? ? ?Assessment and Plan: ?* Cognitive impairment  2/2 Wernicke's encephalopathy and alcoholic dementia ?-Initial MRI revealed tiny acute infarct on the right but not significant enough to explain persistent cognitive and behavioral abnormalities. Also evidence of prior lacunar infarct right basal ganglia ?Cognitive screening revealed significant cognitive deficits ?-Appreciate psychiatric team assistance.  Continue Risperdal, Prozac, Depakote and melatonin as mentioned below.  ?-Continue sitter to allow patient to leave the room in the unit with supervision ?-Repeat SLUMS score was 24/30 on 04/21/21 ?-Given wandering behaviors and threats again will re-consult Pscyh and they will see the patient tomorrow ? ?-Had to be given another dose of Klonopin 1 mg x1 on 05/04/21 and on 05/07/21 ? ?**Please note that sitter is being utilized in the acute hospital setting noting in  the hospital setting we are unable to provide the same physical barriers as a locked unit and therefore for the patient's safety until patient is discharged he will need to require a sitter and a locked unit due to his recurrent wandering behaviors  which increase at night. ? ?After discharge recommendations: PLEASE NOTE THAT THIS PT'S REGIMEN OF PSYCHOTROPIC MEDS CAN NEVER BE WEANED AND DC'D DUE TO SEVERITY OF HIS WERNICKE'S DEMENTIA but will have Psych re-evaluate and they have made several medication changes and recommending his current regimen from [facility]: ? ?-Continue klonopin 0.5mg  TID ?-Continue Prozac 40mg  ?-Continue Melatonin 10mg  QHS ?- Discontinued Trazodone 100mg  QHS due to patient complaint of unsteady gait and hangover effect, last dose 05/07/2021 ?-Continue Depakene 750 and 1000 mg.  Last Depakote level drawn on 04/23/2021 was 60. ?- Continue Risperdal 1mg  in the morning and increase Risperdal to 2 mg at bedtime 05/08/2021 ?-Continue Benadryl to 50 mg scheduled at bedtime to prevent EPS ?-Continue gabapentin 200 mg 3 times daily for neuropathic pain and anxiety ?-Continue ferrous sulfate 325 mg at bedtime for anemia and possible RLS symptoms ?-Psych recommends adding Risperdal 1 mg po after Lunch  ? ? ?Insomnia ?-Discontinued Trazodone 100 mg po qHS was changed to Benadryl 50 mg p.o. nightly and he is also having melatonin 10 mg p.o. nightly ?-Continue to Monitor  ?-Appreciate psychiatric evaluation and recommendations and evaluation ? ?Constipation ?-Resolved ?-Continue Bowel regimen with Miralax 17 grams daily and Docusate 100 mg po BID  ? ?Eczema ?-Resolved with use of topical cortisone cream but has reemerged on his chest.  Hopeful that current prednisone will help.   ?-After prednisone completed will likely need to resume topical cortisone cream at a low dose and this was resumed with topical cortisone to all areas on extremities, abdomen and back  ? ?Protein calorie malnutrition ([name]) ?Patient able to feed self independently and is eating well ? ?Witnessed seizure ([name]) ?-No apparent prior history of seizure disorder noting patient was not on AEDs prior to admission ?-Unclear if withdrawal seizure or related to other substances ?-3/10 given  recent behavioral changes attending felt EEG needed to be completed to rule out atypical seizure activity causing behavioral manifestations.  EEG unremarkable other than for moderate diffuse encephalopathy.  No seizure activity. ?-Continue Levetiracetam 500 mg po BID and Valproic Acid 1000 mg qHS and 750 mg qMorning Daily at recommendation of Neurology ? ?Right ventricular dysfunction ?-Echo 1/2 with EF 60 to 65%.  Cannot rule out a small PFO this finding determined to be clinically insignificant given the patient was asymptomatic ? ?Physical deconditioning 2/2 ambulatory dysfunction ?Patient now ambulating without difficulty ? ?A physical therapy consult is indicated based on the patient?s mobility assessment. ? ?Mobility Assessment (last 72 hours)   ? ? Mobility Assessment   ? ? Row Name 05/10/21 2020 05/09/21 0919  ?  ?  ?  ? Does patient have an order for bedrest or is patient medically unstable No - Continue assessment No - Continue assessment     ? What is the highest level of mobility based on the progressive mobility assessment? Level 6 (Walks independently in room and hall) - Balance while walking in room without assist - Complete Level 6 (Walks independently in room and hall) - Balance while walking in room without assist - Complete     ? Is the above level different from baseline mobility prior to current illness? -- No - Consider discontinuing PT/OT     ? ?  ?  ? ?  ? ? ?  Skin abnormalities/perineal MASD ?-Resolved ? ?Elevated transaminases at time of presentation-resolved as of 03/04/2021 ?-Likely secondary was 2/2 sepsis like physiology at presentation ?-His AST and ALT were significantly elevated to over 700s on admission.  ?-Re emergence mild elevation LFTs-likely due to Zyprexa and Depakote ?-Due to oversedation Zyprexa dose decreased ?-LFTs had normalized with decrease in Zyprexa dose but now LFTs had worsened and are now relatively stable  ?-2/16 valproic acid level 55 ?-AST has gone from 89 -> 106  -> 78 -> 84  -> 77 -> 76 -> 73 and ALT has gone from 143 -> 164 -> 127 -> 122 -> 128 -> 127 ->113 on last check  ?-Continue to Monitor and Trend and Repeat CMP in the AM;  ? ?Aspiration pneumonia ([name])-resolved

## 2021-05-11 NOTE — Progress Notes (Signed)
[facility] night cross cover note: ? ?I was notified by RN of patient's request for sleep aid.  Per my chart review, there is documentation of intentional discontinuation of trazodone in the setting of the patient's encephalopathy.  Consequently, I placed order for as needed melatonin. ? ? ? ? ?Babs Bertin, DO ?Hospitalist ? ?

## 2021-05-12 DIAGNOSIS — N179 Acute kidney failure, unspecified: Secondary | ICD-10-CM | POA: Diagnosis not present

## 2021-05-12 DIAGNOSIS — E512 Wernicke's encephalopathy: Secondary | ICD-10-CM | POA: Diagnosis not present

## 2021-05-12 DIAGNOSIS — G4700 Insomnia, unspecified: Secondary | ICD-10-CM

## 2021-05-12 DIAGNOSIS — J690 Pneumonitis due to inhalation of food and vomit: Secondary | ICD-10-CM

## 2021-05-12 DIAGNOSIS — K5900 Constipation, unspecified: Secondary | ICD-10-CM | POA: Diagnosis not present

## 2021-05-12 DIAGNOSIS — R569 Unspecified convulsions: Secondary | ICD-10-CM

## 2021-05-12 LAB — COMPREHENSIVE METABOLIC PANEL
ALT: 96 U/L — ABNORMAL HIGH (ref 0–44)
AST: 56 U/L — ABNORMAL HIGH (ref 15–41)
Albumin: 3.4 g/dL — ABNORMAL LOW (ref 3.5–5.0)
Alkaline Phosphatase: 36 U/L — ABNORMAL LOW (ref 38–126)
Anion gap: 7 (ref 5–15)
BUN: 22 mg/dL — ABNORMAL HIGH (ref 6–20)
CO2: 26 mmol/L (ref 22–32)
Calcium: 9.2 mg/dL (ref 8.9–10.3)
Chloride: 106 mmol/L (ref 98–111)
Creatinine, Ser: 0.64 mg/dL (ref 0.61–1.24)
GFR, Estimated: >60 mL/min (ref >60)
Glucose, Bld: 88 mg/dL (ref 70–99)
Potassium: 3.9 mmol/L (ref 3.5–5.1)
Sodium: 139 mmol/L (ref 135–145)
Total Bilirubin: 0.5 mg/dL (ref 0.3–1.2)
Total Protein: 6.8 g/dL (ref 6.5–8.1)

## 2021-05-12 LAB — CBC WITH DIFFERENTIAL/PLATELET
Abs Immature Granulocytes: 0.02 10*3/uL (ref 0.00–0.07)
Basophils Absolute: 0 10*3/uL (ref 0.0–0.1)
Basophils Relative: 1 %
Eosinophils Absolute: 0.3 10*3/uL (ref 0.0–0.5)
Eosinophils Relative: 5 %
HCT: 34.4 % — ABNORMAL LOW (ref 39.0–52.0)
Hemoglobin: 12.2 g/dL — ABNORMAL LOW (ref 13.0–17.0)
Immature Granulocytes: 0 %
Lymphocytes Relative: 52 %
Lymphs Abs: 3.1 10*3/uL (ref 0.7–4.0)
MCH: 34.4 pg — ABNORMAL HIGH (ref 26.0–34.0)
MCHC: 35.5 g/dL (ref 30.0–36.0)
MCV: 96.9 fL (ref 80.0–100.0)
Monocytes Absolute: 0.8 10*3/uL (ref 0.1–1.0)
Monocytes Relative: 14 %
Neutro Abs: 1.7 10*3/uL (ref 1.7–7.7)
Neutrophils Relative %: 28 %
Platelets: 159 10*3/uL (ref 150–400)
RBC: 3.55 MIL/uL — ABNORMAL LOW (ref 4.22–5.81)
RDW: 13.1 % (ref 11.5–15.5)
WBC: 6 10*3/uL (ref 4.0–10.5)
nRBC: 0 % (ref 0.0–0.2)

## 2021-05-12 LAB — PHOSPHORUS: Phosphorus: 4 mg/dL (ref 2.5–4.6)

## 2021-05-12 LAB — MAGNESIUM: Magnesium: 2 mg/dL (ref 1.7–2.4)

## 2021-05-12 NOTE — Evaluation (Signed)
Physical Therapy Evaluation ?Patient Details ?Name: Steve [name] ?MRN: [id number] ?DOB: [birth year] ?Today's Date: 05/12/2021 ? ?History of Present Illness ? Pt is a 56 y.o. male admitted from group home for convalescence, combativeness and altered sensorium on 01/05/21 with witnessed seizure, AMS. 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 EEG unremarkable. LP on 12/21 showed staph in CSF, felt to be contaminant. Workup for AKI, rhabdomyolysis. Course complicated by c/o L foot pain; imaging negative for acute injury. Pt remains hospitalized due to difficult to place. Pt with psych consult who felt no need for inpt psych. No PMH in chart. ?  ?Clinical Impression ? MD asked PT to address pt's L foot plantar fascitis. Pt functioning indep, sitter for safety due to impaired cognition. Pt given HEP for L foot plantar fascitis. HEP taped up on wall and sitter and RN notified to cue pt to do these exercises daily as pt with poor recall/memory. Pt able to complete all exercises with demo however unsure reality he will remember to complete daily. PT to reassess 1 week from now the efficacy of pt's HEP and/or if he's been compliant.    ?   ? ?Recommendations for follow up therapy are one component of a multi-disciplinary discharge planning process, led by the attending physician.  Recommendations may be updated based on patient status, additional functional criteria and insurance authorization. ? ?Follow Up Recommendations Long-term institutional care without follow-up therapy ? ?  ?Assistance Recommended at Discharge    ?Patient can return home with the following ? Direct supervision/assist for medications management;Direct supervision/assist for financial management ? ?  ?Equipment Recommendations None recommended by PT  ?Recommendations for Other Services ?    ?  ?Functional Status Assessment Patient has  had a recent decline in their functional status and demonstrates the ability to make significant improvements in function in a reasonable and predictable amount of time.  ? ?  ?Precautions / Restrictions Precautions ?Precautions: Fall ?Precaution Comments: has a sitter ?Restrictions ?Weight Bearing Restrictions: No ?LLE Weight Bearing: Weight bearing as tolerated ?Other Position/Activity Restrictions: CAM boot not needed per ortho note  ? ?  ? ?Mobility ? Bed Mobility ?Overal bed mobility: Independent ?  ?  ?  ?  ?  ?  ?  ?  ? ?Transfers ?Overall transfer level: Independent ?  ?  ?  ?  ?  ?  ?  ?  ?  ?  ? ?Ambulation/Gait ?Ambulation/Gait assistance: Supervision ?  ?  ?  ?  ?  ?  ?  ? ?Stairs ?  ?  ?  ?  ?General stair comments: pt observed daily ambulating in hallways without AD or assist, sitter for safety only ? ?Wheelchair Mobility ?  ? ?Modified Rankin (Stroke Patients Only) ?  ? ?  ? ?Balance Overall balance assessment: Mild deficits observed, not formally tested ?  ?  ?  ?  ?  ?  ?  ?  ?  ?  ?  ?  ?  ?  ?  ?  ?  ?  ?   ? ? ? ?Pertinent Vitals/Pain Pain Assessment ?Pain Assessment: 0-10 ?Pain Score: 8  ?Pain Location: L ankle and bottom of foot ?Pain Descriptors / Indicators: Sharp ?Pain Intervention(s):  (given HEP)  ? ? ?Home Living   ?  ?  ?  ?  ?  ?  ?  ?  ?  ?  Additional Comments: pt homeless, awaiting placement.  ?  ?Prior Function Prior Level of Function : Independent/Modified Independent (requires sitter due to impaired cognition and high flight risk) ?  ?  ?  ?  ?  ?  ?  ?  ?  ? ? ?Hand Dominance  ?   ? ?  ?Extremity/Trunk Assessment  ? Upper Extremity Assessment ?Upper Extremity Assessment: Overall WFL for tasks assessed ?  ? ?Lower Extremity Assessment ?Lower Extremity Assessment: LLE deficits/detail ?LLE Deficits / Details: limited ankle ROM due to pain and calf tightness ?  ? ?   ?Communication  ? Communication: No difficulties  ?Cognition Arousal/Alertness: Awake/alert ?Behavior During Therapy:  [facility] for tasks assessed/performed ?Overall Cognitive Status: History of cognitive impairments - at baseline ?  ?  ?  ?  ?  ?  ?  ?  ?  ?  ?  ?  ?  ?  ?  ?  ?General Comments: pt with known wernikies dementia. Pt pleasant and able to follow commands but with poor recall. HEP taped up on wall and RN and sitter informed to instruct pt to do his exercises 3x/day ?  ?  ? ?  ?General Comments General comments (skin integrity, edema, etc.): VSS ? ?  ?Exercises Other Exercises ?Other Exercises: instructed on calf stretch against wall, 3x, 30 sec each, max tactile cues for optimal position for most effective stretch, noted dec DF ?Other Exercises: instructed on rolling L foot over soup can to help break up adhesions, instructed to progressively increased WBing through foot onto can for deeper pressure ?Other Exercises: instruct on towel calf stretch in sitting and long sit in bed 3x, 30 sec, pt requiring max directional cues for optimal posture for most effective stretch ?Other Exercises: instructed on cross-friction massage to plantar surface of L foot  ? ?Assessment/Plan  ?  ?PT Assessment Patient needs continued PT services  ?PT Problem List Decreased strength;Decreased range of motion;Pain ? ?   ?  ?PT Treatment Interventions Therapeutic exercise   ? ?PT Goals (Current goals can be found in the Care Plan section)  ?Acute Rehab PT Goals ?PT Goal Formulation: With patient ?Time For Goal Achievement: 05/26/21 ?Potential to Achieve Goals: Good ?Additional Goals ?Additional Goal #1: Pt indep and compliant with daily HEP for L foot plantar fascitis to minimize pain in L foot. ? ?  ?Frequency Min 1X/week (to monitor efficacy of HEP) ?  ? ? ?Co-evaluation   ?  ?  ?  ?  ? ? ?  ?AM-PAC PT "6 Clicks" Mobility  ?Outcome Measure Help needed turning from your back to your side while in a flat bed without using bedrails?: None ?Help needed moving from lying on your back to sitting on the side of a flat bed without using bedrails?:  None ?Help needed moving to and from a bed to a chair (including a wheelchair)?: None ?Help needed standing up from a chair using your arms (e.g., wheelchair or bedside chair)?: None ?Help needed to walk in hospital room?: None ?Help needed climbing 3-5 steps with a railing? : A Little ?6 Click Score: 23 ? ?  ?End of Session   ?Activity Tolerance: Patient tolerated treatment well ?Patient left: in chair;with call bell/phone within reach;with nursing/sitter in room ?Nurse Communication: Mobility status ?PT Visit Diagnosis: Pain ?Pain - Right/Left: Left ?Pain - part of body: Ankle and joints of foot ?  ? ?Time: [id number] ?PT Time Calculation (min) (ACUTE ONLY): 18 min ? ? ?Charges:  PT Evaluation ?$PT Eval Moderate Complexity: 1 Mod ?  ?  ?   ? ? ?Kittie Plater, PT, DPT ?Acute Rehabilitation Services ?Secure chat preferred ?Office #: [phone] ? ? ?Sedalia Greeson M Bernestine Holsapple ?05/12/2021, 9:34 AM ? ?

## 2021-05-12 NOTE — Progress Notes (Signed)
?PROGRESS NOTE ? ? ? ?Steve [name]  HDQ:[id number] DOB: 1965-11-28 DOA: 01/05/2021 ?PCP: No primary care provider on file. ? ? ?Chief Complaint  ?Patient presents with  ? Altered Mental Status  ? Seizures  ? ? ?Brief Narrative:  ?Steve [name] is a 56 y.o. male with history of major depression, anxiety was brought in to the ED on 01/05/2022 from residential alcohol treatment center for combativeness, altered sensorium.   ?In the ED, patient was confused, combative. Labs showed AKI with creatinine of 3.27, rhabdomyolysis, abnormal LFTs, elevated WBC count at 23.7 and lactic acid level at 7.6. 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 Blood culture was sent, empiric antibiotic started.  IV fluid resuscitation started and patient was initially admitted to ICU. ?12/21, underwent LP which showed staph in CSF, felt to be contaminant.  EEG did not show any seizures ?12/24, HD catheter was placed but patient did not require HD as creatinine started to improve ?12/31, with clinical improvement, patient was transferred out of ICU to [facility]. ?1/18 Cognitive evaluations: 2 on the SLUMS, a 9 on the MMSE, and a 0 on medi-cog and short blessed test ? ?On 05/04/2021 he became very agitated and was given additional dose of Klonopin. Additionally he became agitated and started threatening to Leave AMA again on 05/07/21, on 05/07/2021 and again today.  Psychiatry has been consulted for further evaluation recommendations given that he continues to have wandering behaviors. His SLUMS score is improved to 24 out of 30.  Psychiatry has made some further recommendations and recommending continuing Klonopin 0.5 mg p.o. 3 times daily, Prozac 40 mg p.o. daily, melatonin 10 mg nightly, and they have stopped the trazodone 100 mg p.o. nightly.  Psychiatry recommending continue Depakene 750 and 1000 mg as well as Risperdal 1 mg in  the morning and increase Risperdal to 2 mg nightly.  Psychiatry also recommends continuing Benadryl 50 mg nightly to prevent EPS and continue gabapentin 200 g 3 times daily for neuropathic pain and anxiety have also started the patient on ferrous sulfate 325 mg p.o. nightly for anemia and possible RLS symptoms. ?  ?Because he continues to be agitated in the afternoon and ambulating to the desk and trying to leave Psychiatry recommended adding Risperdal 1 mg after lunch. ?  ?Currently awaiting A locked unit in a Memory Care Unit and but still has no bed offers and still awaiting disability and long-term Medicaid.  Several barriers for his memory care unit placement given that he is young, has no income and has a history of alcohol use.  ?  ? ? ?Assessment & Plan: ?  ?Principal Problem: ?  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  Skin abnormalities/perineal MASD ?  Physical deconditioning 2/2 ambulatory dysfunction ?  Right ventricular dysfunction ?  Eczema ?  Pressure injury of skin ?  Constipation ?  Insomnia ? ?#1 cognitive impairment secondary to Wernicke's encephalopathy and alcoholic dementia ?-Noted initial MRI revealed a tiny acute infarct in the right but not significant enough to explain patient's persistent cognitive and behavioral abnormalities.  Also noted was prior lacunar infarct right basal ganglia. ?-Cognitive screening revealed significant cognitive deficits; 4/5 repeat SLUMS improved to 24/30 but still with cognitive impairments. ?-Patient seen in consultation by psychiatry and medications adjusted accordingly per psych recommendations. ?-Patient noted to have been under IVC due to persistent wandering behaviors and  threats to leave AMA. ?-Plan was to allow IVC to expire but continue sitter. ?-It is noted that patient's overall calmness and outlook have improved now that he is allowed to go off the unit with a sitter so he is not stuck in the  room. ?-Due to wandering behaviors and threats again to leave [facility] psychiatry reconsulted and saw the patient and made some recommendations and medications adjusted accordingly. ?-Patient noted to have received another dose of Klonopin 1 mg on 05/04/2021 and on 05/07/2021 ? ? ?**Please note that sitter is being utilized in the acute hospital setting noting in the hospital setting we are unable to provide the same physical barriers as a locked unit and therefore for the patient's safety until patient is discharged he will need to require a sitter and a locked unit due to his recurrent wandering behaviors which increase at night. ? ?After discharge recommendations: PLEASE NOTE THAT THIS PT'S REGIMEN OF PSYCHOTROPIC MEDS CAN NEVER BE WEANED AND DC'D DUE TO SEVERITY OF HIS [facility] DEMENTIA psychiatry has reassessed patient who made medication changes with current regimen as noted from psychiatry below ? ?-Continue klonopin 0.5mg  TID ?-Continue Prozac 40mg  ?-Continue Melatonin 10mg  QHS ?- Discontinued Trazodone 100mg  QHS due to patient complaint of unsteady gait and hangover effect, last dose 05/07/2021 ?-Continue Depakene 750 and 1000 mg.  Last Depakote level drawn on 04/23/2021 was 60. ?- Continue Risperdal 1mg  in the morning and increase Risperdal to 2 mg at bedtime 05/08/2021 ?-Continue Benadryl to 50 mg scheduled at bedtime to prevent EPS ?-Continue gabapentin 200 mg 3 times daily for neuropathic pain and anxiety ?-Continue ferrous sulfate 325 mg at bedtime for anemia and possible RLS symptoms ?-Psych recommends adding Risperdal 1 mg po after Lunch  ?  ? ?2.  Left knee pain ?-Noted to be a new finding primarily over the patella noted with palpation. ?-Plain films done negative. ? ?3.  Left lower extremity swelling ?-Patient noted to have some chronic swelling and pain in the left foot and ankle extending to the calf. ?-Lower extremity Dopplers negative for DVT. ?-Swelling improving. ?-Plain films negative for any acute  fracture. ?-Supportive care. ? ?4.  Witnessed seizure ?-Patient noted with prior history of seizures. ?-On 3/10 given recent behavior changes attending felt EEG needed to be completed to rule out atypical seizure-like activity causing behavioral manifestations. ?-EEG which was done was unremarkable other than moderate diffuse encephalopathy with no seizure activity noted. ?-Patient seen by neurology. ?-Continue current regimen of Keppra ?-We will need outpatient follow-up with neurology. ? ?5.  Acute kidney injury with rhabdomyolysis ?-Resolved. ?-Renal ultrasound done negative for hydronephrosis. ?-Creatinine peaked at 7.48 on 01/11/2022. ?-Patient was seen in consultation by nephrology initial plans was to initiate dialysis including placement of [facility] but renal function gradually improved and dialysis was not needed and TDC discontinued. ?-Creatinine currently at 0.64(05/12/2021). ?-Supportive care. ? ?6.  Eczema ?-Continue current cream. ? ?7.  Cellulitis/questionable early abscess in the right neck ?-Improved on Keflex. ? ?8.  Possible Morton's neuroma ?-Patient with some complaints of numbness and tingling with weightbearing on the left great toe as well as adjacent 2 toes. ?-Continue Neurontin, oxycodone, Motrin. ? ?9.  Constipation ?-Resolved ?-Bowel regimen as needed. ? ?10.  Right ventricular dysfunction ?Repeat 2D echo 01/18/2021 with a EF of 60 to 65% and improvement with right ventricular dysfunction noted as normal.  Cannot rule out small PFO but finding determined to be clinically insignificant given patient was asymptomatic. ? ?11.  Transaminitis at time  of presentation ?-Felt likely secondary to sepsis physiology. ?-AST ALT noted to be significantly elevated over 700s on admission. ?-Felt could have been secondary to Zyprexa and Depakote. ?-LFTs trended down with decreasing Zyprexa dose. ? ?12.  Physical deconditioning secondary to ambulatory dysfunction ?-Improved. ?-Patient ambulating without  difficulty. ? ?13.  Left ankle pain ?-Received prednisone and colchicine for possible gout. ?-Improved. ?-No further work-up needed ? ?14.  Protein calorie malnutrition ?-Patient able to feed himself independent

## 2021-05-12 NOTE — TOC Progression Note (Signed)
Transition of Care (TOC) - Progression Note  ? ? ?Patient Details  ?Name: [name] ?MRN: [id number] ?Date of Birth: 06/20/1965 ? ?Transition of Care (TOC) CM/SW Contact  ?Janae Bridgeman, RN ?Phone Number: ?05/12/2021, 1:35 PM ? ?Clinical Narrative:    ?CM called and spoke with Vincenza Hews, Admissions Director with [facility] and the facility has a Memory Care ALF in [place], [place] that is a locked facility for care - clinicals were resent in the hub for the director to review. ? ?CM and MSW with DTP Team will continue to follow the patient for discharge planning and bed availability at a memory care facility. ? ? ?Expected Discharge Plan: Skilled Nursing Facility ?Barriers to Discharge: Continued Medical Work up, Homeless with medical needs, Inadequate or no insurance ? ?Expected Discharge Plan and Services ?Expected Discharge Plan: Skilled Nursing Facility ?In-house Referral: PCP / Health Connect ?Discharge Planning Services: CM Consult, MATCH Program, Medication Assistance, Follow-up appt scheduled, Indigent Health Clinic ?  ?Living arrangements for the past 2 months: Group Home (Patient was resident at [facility] [place] prior to hospital admission.) ?Expected Discharge Date: 01/24/21               ?  ?  ?  ?  ?  ?  ?  ?  ?  ?  ? ? ?Social Determinants of Health (SDOH) Interventions ?  ? ?Readmission Risk Interventions ?   ? View : No data to display.  ?  ?  ?  ? ? ?

## 2021-05-12 NOTE — Progress Notes (Addendum)
Patient was denied admission to [facility]. ? ?CSW attempted to reach staff at [facility] - a message was left requesting a return call. ? ?Edwin Dada, MSW, LCSW ?Transitions of Care  Clinical Social Worker II ?[phone] ? ?

## 2021-05-13 MED ORDER — ASPIRIN 325 MG PO TABS: 325.0000 mg | ORAL_TABLET | Freq: Every day | ORAL | Status: AC

## 2021-05-13 MED ORDER — POLYVINYL ALCOHOL 1.4 % OP SOLN: 1.0000 [drp] | OPHTHALMIC | 0 refills | Status: AC | PRN

## 2021-05-13 MED ORDER — HYDROCORTISONE 1 % EX CREA: TOPICAL_CREAM | Freq: Three times a day (TID) | CUTANEOUS | 0 refills | Status: AC

## 2021-05-13 MED ORDER — DICLOFENAC SODIUM 1 % EX GEL: 2.0000 g | Freq: Four times a day (QID) | CUTANEOUS | 0 refills | Status: AC

## 2021-05-13 MED ORDER — LIDOCAINE 5 % EX PTCH: 1.0000 | MEDICATED_PATCH | CUTANEOUS | 0 refills | Status: AC

## 2021-05-13 MED ORDER — RISPERIDONE 1 MG PO TBDP: ORAL_TABLET | ORAL | 1 refills | Status: AC

## 2021-05-13 MED ORDER — QUETIAPINE FUMARATE 100 MG PO TABS: 100.0000 mg | ORAL_TABLET | Freq: Two times a day (BID) | ORAL | 1 refills | Status: AC

## 2021-05-13 MED ORDER — ONDANSETRON 4 MG PO TBDP: 4.0000 mg | ORAL_TABLET | Freq: Three times a day (TID) | ORAL | 0 refills | Status: AC | PRN

## 2021-05-13 MED ORDER — PANTOPRAZOLE SODIUM 40 MG PO TBEC
40.0000 mg | DELAYED_RELEASE_TABLET | Freq: Every day | ORAL | Status: DC
  Administered 2021-05-13 – 2021-05-14 (×2): 40 mg via ORAL
  Filled 2021-05-13 (×2): qty 1

## 2021-05-13 MED ORDER — ALUM & MAG HYDROXIDE-SIMETH 200-200-20 MG/5ML PO SUSP
30.0000 mL | Freq: Once | ORAL | Status: AC
Start: 2021-05-13 — End: 2021-05-13
  Administered 2021-05-13: 30 mL via ORAL
  Filled 2021-05-13: qty 30

## 2021-05-13 MED ORDER — NICOTINE 21 MG/24HR TD PT24
21.0000 mg | MEDICATED_PATCH | Freq: Every day | TRANSDERMAL | 0 refills | Status: AC
Start: 2021-05-14 — End: ?

## 2021-05-13 MED ORDER — VALPROIC ACID 250 MG PO CAPS: ORAL_CAPSULE | ORAL | 0 refills | Status: AC

## 2021-05-13 MED ORDER — MELATONIN 10 MG PO TABS: 10.0000 mg | ORAL_TABLET | Freq: Every day | ORAL | 0 refills | Status: AC

## 2021-05-13 MED ORDER — FLUOXETINE HCL 40 MG PO CAPS: 40.0000 mg | ORAL_CAPSULE | Freq: Every day | ORAL | 1 refills | Status: AC

## 2021-05-13 MED ORDER — DOCUSATE SODIUM 100 MG PO CAPS: 100.0000 mg | ORAL_CAPSULE | Freq: Two times a day (BID) | ORAL | 0 refills | Status: AC

## 2021-05-13 MED ORDER — ENSURE ENLIVE PO LIQD: 237.0000 mL | Freq: Three times a day (TID) | ORAL | 12 refills | Status: AC

## 2021-05-13 MED ORDER — GABAPENTIN 100 MG PO CAPS: 200.0000 mg | ORAL_CAPSULE | Freq: Three times a day (TID) | ORAL | 0 refills | Status: AC

## 2021-05-13 MED ORDER — FOLIC ACID 1 MG PO TABS: 1.0000 mg | ORAL_TABLET | Freq: Every day | ORAL | 0 refills | Status: AC

## 2021-05-13 MED ORDER — PANTOPRAZOLE SODIUM 40 MG PO TBEC: 40.0000 mg | DELAYED_RELEASE_TABLET | Freq: Every day | ORAL | 1 refills | Status: AC

## 2021-05-13 MED ORDER — DIPHENHYDRAMINE HCL 50 MG PO CAPS: 50.0000 mg | ORAL_CAPSULE | Freq: Every day | ORAL | 0 refills | Status: AC

## 2021-05-13 MED ORDER — LEVETIRACETAM 500 MG PO TABS: 500.0000 mg | ORAL_TABLET | Freq: Two times a day (BID) | ORAL | 0 refills | Status: AC

## 2021-05-13 MED ORDER — CLONAZEPAM 0.5 MG PO TABS
0.5000 mg | ORAL_TABLET | Freq: Three times a day (TID) | ORAL | 0 refills | Status: AC
Start: 2021-05-13 — End: ?

## 2021-05-13 MED ORDER — ACETAMINOPHEN 325 MG PO TABS: 650.0000 mg | ORAL_TABLET | ORAL | Status: AC | PRN

## 2021-05-13 MED ORDER — POLYETHYLENE GLYCOL 3350 17 G PO PACK: 17.0000 g | PACK | Freq: Every day | ORAL | 0 refills | Status: AC

## 2021-05-13 MED ORDER — OXYCODONE HCL 7.5 MG PO TABS: 7.5000 mg | ORAL_TABLET | ORAL | 0 refills | Status: AC | PRN

## 2021-05-13 MED ORDER — THIAMINE HCL 100 MG PO TABS
250.0000 mg | ORAL_TABLET | Freq: Every day | ORAL | 0 refills | Status: AC
Start: 2021-05-14 — End: ?

## 2021-05-13 MED ORDER — FERROUS SULFATE 325 (65 FE) MG PO TABS: 325.0000 mg | ORAL_TABLET | Freq: Every day | ORAL | 0 refills | Status: AC

## 2021-05-13 MED ORDER — LIDOCAINE VISCOUS HCL 2 % MT SOLN
15.0000 mL | Freq: Once | OROMUCOSAL | Status: AC
Start: 2021-05-13 — End: 2021-05-13
  Administered 2021-05-13: 15 mL via ORAL
  Filled 2021-05-13: qty 15

## 2021-05-13 MED ORDER — RIVAROXABAN 10 MG PO TABS: 10.0000 mg | ORAL_TABLET | Freq: Every day | ORAL | 0 refills | Status: AC

## 2021-05-13 NOTE — TOC Progression Note (Addendum)
Transition of Care (TOC) - Progression Note  ? ? ?Patient Details  ?Name: Steve [name] ?MRN: [id number] ?Date of Birth: [birth year] ? ?Transition of Care (TOC) CM/SW Contact  ?Janae Bridgeman, RN ?Phone Number: ?05/13/2021, 8:39 AM ? ?Clinical Narrative:    ?CM and MSW with DTP Team are continuing to explore options for Memory Care facilities at this time for placement - no bed offers at this time.  Vincenza Hews, CM with [facility] has an available facility in [place] that is currently reviewing the patient's clinicals. ? ?I called and left a message with [name], APS SW and Rica Koyanagi, Medicaid worker [phone] to inquire if patient was approved for disability and SSI check that may assist with the patient's placement. Will follow up. ? ?05/13/2021 1222 - CM called and spoke with Sharyn Creamer, SW with [facility] in [facility] and the facility has available Medicaid Memory care beds that our available in the locked facility.  Clinicals were emailed to the facility to [email or web address].  Clinicals included current MAR, progress notes, and FL2. ? ?CM and MSW with DTP Team will continue to follow the patient for LTC placement at a nursing facility with memory care availability. ? ? ?Expected Discharge Plan: Skilled Nursing Facility ?Barriers to Discharge: Continued Medical Work up, Homeless with medical needs, Inadequate or no insurance ? ?Expected Discharge Plan and Services ?Expected Discharge Plan: Skilled Nursing Facility ?In-house Referral: PCP / Health Connect ?Discharge Planning Services: CM Consult, MATCH Program, Medication Assistance, Follow-up appt scheduled, Indigent Health Clinic ?  ?Living arrangements for the past 2 months: Group Home (Patient was resident at [facility] [place] prior to hospital admission.) ?Expected Discharge Date: 01/24/21               ?  ?  ?  ?  ?  ?  ?  ?  ?  ?  ? ? ?Social Determinants of Health (SDOH) Interventions ?  ? ?Readmission Risk  Interventions ?   ? View : No data to display.  ?  ?  ?  ? ? ?

## 2021-05-13 NOTE — Progress Notes (Addendum)
2:10pm: ?CSW spoke with [facility] staff who states payment is required prior to transportation for any distances over 50 miles.  ? ?CSW spoke with [facility] who states the earliest they can transport patient is 8:30pm tomorrow. ? ?CSW spoke with [facility] who states they can transport the patient tomorrow. Pick up will be between 11am and 12pm tomorrow. ? ?CSW attempted to reach patient's son via phone without success - no answer, so a text message was sent requesting a return call. ? ?11am: ?CSW attempted to reach admissions at [facility] in [place] without success - a voicemail was left requesting a return call. ? ?9am: ?CSW spoke with Elmo Putt of the [facility] who states the patient's application is still pending due to lack of supplemental documentation and signed forms. ? ?CSW attempted to reach [place] at [place] without success - a voicemail was left requesting a return call. ? ?CSW spoke with patient's son Donna Christen to provide updated information. Donna Christen states he is willing to sign paperwork to support the disability application. Donna Christen states he is not pursuing guardianship of the patient but will assist in signing him into a facility. CSW provided [place] with contact number for 3W to call and speak with the patient. ? ?Madilyn Fireman, MSW, LCSW ?Transitions of Care  Clinical Social Worker II ?[phone] ? ?

## 2021-05-13 NOTE — TOC Progression Note (Signed)
Transition of Care (TOC) - Progression Note  ? ? ?Patient Details  ?Name: Steve [name] ?MRN: [id number] ?Date of Birth: Jan 07, 1966 ? ?Transition of Care (TOC) CM/SW Contact  ?Janae Bridgeman, RN ?Phone Number: ?05/13/2021, 2:09 PM ? ?Clinical Narrative:    ?CM called and spoke with Sharyn Creamer, SW at [place] at [facility] in [place], [place].  The attending physician, Dr. Janee Morn was notified and asked to complete discharge summary and orders.  Ambulance transportation will be arranged since the patient has altered and will need attendant with him for his safety on transport.  No screen is required for admission. ? ?Edwin Dada, MSW was notified and aware and patient's son, [name], [place] with APS will be updated so that the patient's son can sign the patient in for admission. ? ?CM and MSW with DTP Team will continue to follow the patient for LTC admission to [place] at [facility]. ? ?Expected Discharge Plan: Skilled Nursing Facility ?Barriers to Discharge: Continued Medical Work up, Homeless with medical needs, Inadequate or no insurance ? ?Expected Discharge Plan and Services ?Expected Discharge Plan: Skilled Nursing Facility ?In-house Referral: PCP / Health Connect ?Discharge Planning Services: CM Consult, MATCH Program, Medication Assistance, Follow-up appt scheduled, Indigent Health Clinic ?  ?Living arrangements for the past 2 months: Group Home (Patient was resident at [facility] [place] prior to hospital admission.) ?Expected Discharge Date: 01/24/21               ?  ?  ?  ?  ?  ?  ?  ?  ?  ?  ? ? ?Social Determinants of Health (SDOH) Interventions ?  ? ?Readmission Risk Interventions ?   ? View : No data to display.  ?  ?  ?  ? ? ?

## 2021-05-13 NOTE — Progress Notes (Signed)
?PROGRESS NOTE ? ? ? ?Steve [name]  PTW:[id number] DOB: 09-28-1965 DOA: 01/05/2021 ?PCP: No primary care provider on file. ? ? ?Chief Complaint  ?Patient presents with  ? Altered Mental Status  ? Seizures  ? ? ?Brief Narrative:  ?Steve [name] is a 56 y.o. male with history of major depression, anxiety was brought in to the ED on 01/05/2022 from residential alcohol treatment center for combativeness, altered sensorium.   ?In the ED, patient was confused, combative. Labs showed AKI with creatinine of 3.27, rhabdomyolysis, abnormal LFTs, elevated WBC count at 23.7 and lactic acid level at 7.6. 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 Blood culture was sent, empiric antibiotic started.  IV fluid resuscitation started and patient was initially admitted to ICU. ?12/21, underwent LP which showed staph in CSF, felt to be contaminant.  EEG did not show any seizures ?12/24, HD catheter was placed but patient did not require HD as creatinine started to improve ?12/31, with clinical improvement, patient was transferred out of ICU to [facility]. ?1/18 Cognitive evaluations: 2 on the SLUMS, a 9 on the MMSE, and a 0 on medi-cog and short blessed test ? ?On 05/04/2021 he became very agitated and was given additional dose of Klonopin. Additionally he became agitated and started threatening to Leave AMA again on 05/07/21, on 05/07/2021 and again today.  Psychiatry has been consulted for further evaluation recommendations given that he continues to have wandering behaviors. His SLUMS score is improved to 24 out of 30.  Psychiatry has made some further recommendations and recommending continuing Klonopin 0.5 mg p.o. 3 times daily, Prozac 40 mg p.o. daily, melatonin 10 mg nightly, and they have stopped the trazodone 100 mg p.o. nightly.  Psychiatry recommending continue Depakene 750 and 1000 mg as well as Risperdal 1 mg in  the morning and increase Risperdal to 2 mg nightly.  Psychiatry also recommends continuing Benadryl 50 mg nightly to prevent EPS and continue gabapentin 200 g 3 times daily for neuropathic pain and anxiety have also started the patient on ferrous sulfate 325 mg p.o. nightly for anemia and possible RLS symptoms. ?  ?Because he continues to be agitated in the afternoon and ambulating to the desk and trying to leave Psychiatry recommended adding Risperdal 1 mg after lunch. ?  ?Currently awaiting A locked unit in a Memory Care Unit and but still has no bed offers and still awaiting disability and long-term Medicaid.  Several barriers for his memory care unit placement given that he is young, has no income and has a history of alcohol use.  ?  ? ? ?Assessment & Plan: ?  ?Principal Problem: ?  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  Skin abnormalities/perineal MASD ?  Physical deconditioning 2/2 ambulatory dysfunction ?  Right ventricular dysfunction ?  Eczema ?  Pressure injury of skin ?  Constipation ?  Insomnia ?  Seizure ([name]) ? ?#1 cognitive impairment secondary to Wernicke's encephalopathy and alcoholic dementia ?-Noted initial MRI revealed a tiny acute infarct in the right but not significant enough to explain patient's persistent cognitive and behavioral abnormalities.  Also noted was prior lacunar infarct right basal ganglia. ?-Cognitive screening revealed significant cognitive deficits; 4/5 repeat SLUMS improved to 24/30 but still with cognitive impairments. ?-Patient seen in consultation by psychiatry and medications adjusted accordingly per psych recommendations. ?-Patient noted to have been under IVC due to  persistent wandering behaviors and threats to leave AMA. ?-Plan was to allow IVC to expire but continue sitter. ?-It is noted that patient's overall calmness and outlook have improved now that he is allowed to go off the unit with a sitter so he is  not stuck in the room. ?-Due to wandering behaviors and threats again to leave [facility] psychiatry reconsulted and saw the patient and made some recommendations and medications adjusted accordingly. ?-Patient noted to have received another dose of Klonopin 1 mg on 05/04/2021 and on 05/07/2021 ? ? ?**Please note that sitter is being utilized in the acute hospital setting noting in the hospital setting we are unable to provide the same physical barriers as a locked unit and therefore for the patient's safety until patient is discharged he will need to require a sitter and a locked unit due to his recurrent wandering behaviors which increase at night. ? ?After discharge recommendations: PLEASE NOTE THAT THIS PT'S REGIMEN OF PSYCHOTROPIC MEDS CAN NEVER BE WEANED AND DC'D DUE TO SEVERITY OF HIS [facility] DEMENTIA psychiatry has reassessed patient who made medication changes with current regimen as noted from psychiatry below ? ?-Continue klonopin 0.5mg  TID ?-Continue Prozac 40mg  ?-Continue Melatonin 10mg  QHS ?- Discontinued Trazodone 100mg  QHS due to patient complaint of unsteady gait and hangover effect, last dose 05/07/2021 ?-Continue Depakene 750 and 1000 mg.  Last Depakote level drawn on 04/23/2021 was 60. ?- Continue Risperdal 1mg  in the morning and increase Risperdal to 2 mg at bedtime 05/08/2021 ?-Continue Benadryl to 50 mg scheduled at bedtime to prevent EPS ?-Continue gabapentin 200 mg 3 times daily for neuropathic pain and anxiety ?-Continue ferrous sulfate 325 mg at bedtime for anemia and possible RLS symptoms ?-Psych recommends adding Risperdal 1 mg po after Lunch  ?  ? ?2.  Left knee pain ?-Noted to be a new finding primarily over the patella noted with palpation. ?-Plain films done negative. ? ?3.  Left lower extremity swelling ?-Patient noted to have some chronic swelling and pain in the left foot and ankle extending to the calf. ?-Lower extremity Dopplers negative for DVT. ?-Swelling improving. ?-Plain films  negative for any acute fracture. ?-Continue Lidoderm patch ?-Supportive care. ? ?4.  Witnessed seizure ?-Patient noted with prior history of seizures. ?-On 3/10 given recent behavior changes attending felt EEG needed to be completed to rule out atypical seizure-like activity causing behavioral manifestations. ?-EEG which was done was unremarkable other than moderate diffuse encephalopathy with no seizure activity noted. ?-Patient seen by neurology. ?-Continue current regimen of Keppra. ?-We will need outpatient follow-up with neurology. ? ?5.  Acute kidney injury with rhabdomyolysis ?-Resolved. ?-Renal ultrasound done negative for hydronephrosis. ?-Creatinine peaked at 7.48 on 01/11/2022. ?-Patient was seen in consultation by nephrology initial plans was to initiate dialysis including placement of [facility] but renal function gradually improved and dialysis was not needed and TDC discontinued. ?-Creatinine currently at 0.64(05/12/2021). ?-Supportive care. ? ?6.  Eczema ?-Continue current cream. ? ?7.  Cellulitis/questionable early abscess in the right neck ?-Improved on Keflex. ? ?8.  Possible Morton's neuroma ?-Patient with some complaints of numbness and tingling with weightbearing on the left great toe as well as adjacent 2 toes. ?-Continue Neurontin, oxycodone, Motrin. ? ?9.  Constipation ?-Resolved ?-Bowel regimen as needed. ? ?10.  Right ventricular dysfunction ?Repeat 2D echo 01/18/2021 with a EF of 60 to 65% and improvement with right ventricular dysfunction noted as normal.  Cannot rule out small PFO but finding determined to be clinically insignificant given patient was  asymptomatic. ? ?11.  Transaminitis at time of presentation ?-Felt likely secondary to sepsis physiology. ?-AST ALT noted to be significantly elevated over 700s on admission. ?-Felt could have been secondary to Zyprexa and Depakote. ?-LFTs trended down with decreasing Zyprexa dose. ?-No further work-up needed ? ?12.  Physical deconditioning  secondary to ambulatory dysfunction ?-Improved. ?-Patient ambulating without difficulty. ? ?13.  Left ankle pain ?-Received prednisone and colchicine for possible gout. ?-Improved. ?-No further work-up needed ? ?14.

## 2021-05-14 NOTE — Discharge Summary (Signed)
Physician Discharge Summary  ?Steve [name] [id number] DOB: 1965/12/31 DOA: 01/05/2021 ? ?PCP: No primary care provider on file. ? ?Admit date: 01/05/2021 ?Discharge date: 05/14/2021 ? ?Time spent: 60 minutes ? ?Recommendations for Outpatient Follow-up:  ?Follow-up with MD at facility.  Patient will need a basic metabolic profile done in 2 weeks to follow-up on electrolytes and renal function. ?Follow-up with outpatient psychiatry in 3 to 4 weeks. ?Follow-up with neurology in 4 weeks. ? ? ?Discharge Diagnoses:  ?Principal Problem: ?  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  Skin abnormalities/perineal MASD ?  Physical deconditioning 2/2 ambulatory dysfunction ?  Right ventricular dysfunction ?  Eczema ?  Pressure injury of skin ?  Constipation ?  Insomnia ?  Seizure ([place]) ? ? ?Discharge Condition: Stable and improved ? ?Diet recommendation: Regular ? ?Filed Weights  ? 02/27/21 0501 03/05/21 0500 03/06/21 0500  ?Weight: 60.7 kg 57.9 kg 64.3 kg  ? ? ?History of present illness:  ?HPI per Dr.Chand ?This is a 56 year old white male with limited medical history on presentation to the emergency room.  He was brought from EMS after being found at a group home.  Reportedly his roommate found him having full body convulsions, followed by altered sensorium and combativeness.  Per report from EMS roommate had been noticing the patient was not acting typical for the prior few days before. ?On arrival to ER was found to be slightly hypotensive with systolic blood pressure in the 90s, room air saturations low 90s, he was confused, and at times combative.  A CT head was obtained this was negative for acute bleeding but did show prominent fluid space at the left cerebral pontine angle raising possibility of epidermoid or arachnoid cyst. ?Initial lab work: Serum creatinine of 3.27, abnormal LFTs, white blood cell count 23.7, and lactic acid 7.6.  Blood cultures were sent,  empiric antibiotic started, IV fluid resuscitation administered.  Because of multiple metabolic derangements, altered sensorium, and concern about seizure the critical care team was asked to admit ? ?Hospital Course:  ?Brief Narrative:  ?Steve [name] is a 56 y.o. male with history of major depression, anxiety was brought in to the ED on 01/05/2022 from residential alcohol treatment center for combativeness, altered sensorium.   ?In the ED, patient was confused, combative. Labs showed AKI with creatinine of 3.27, rhabdomyolysis, abnormal LFTs, elevated WBC count at 23.7 and lactic acid level at 7.6. 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 Blood culture was sent, empiric antibiotic started.  IV fluid resuscitation started and patient was initially admitted to ICU. ?12/21, underwent LP which showed staph in CSF, felt to be contaminant.  EEG did not show any seizures ?12/24, HD catheter was placed but patient did not require HD as creatinine started to improve ?12/31, with clinical improvement, patient was transferred out of ICU to [facility]. ?1/18 Cognitive evaluations: 2 on the SLUMS, a 9 on the MMSE, and a 0 on medi-cog and short blessed test ?  ?On 05/04/2021 he became very agitated and was given additional dose of Klonopin. Additionally he became agitated and started threatening to Leave AMA again on 05/07/21, on 05/07/2021 and again today.  Psychiatry has been consulted for further evaluation recommendations given that he continues to have wandering behaviors. His SLUMS score is improved to 24 out of 30.  Psychiatry made some further recommendations and recommending continuing Klonopin 0.5  mg p.o. 3 times daily, Prozac 40 mg p.o. daily, melatonin 10 mg nightly, and they have stopped the trazodone 100 mg p.o. nightly.  Psychiatry recommending continue Depakene 750 and 1000 mg as well as Risperdal 1  mg in the morning and increase Risperdal to 2 mg nightly.  Psychiatry also recommends continuing Benadryl 50 mg nightly to prevent EPS and continue gabapentin 200 g 3 times daily for neuropathic pain and anxiety have also started the patient on ferrous sulfate 325 mg p.o. nightly for anemia and possible RLS symptoms. ?  ?Because he continues to be agitated in the afternoon and ambulating to the desk and trying to leave Psychiatry recommended adding Risperdal 1 mg after lunch. ? ? #1 cognitive impairment secondary to Wernicke's encephalopathy and alcoholic dementia ?-Noted initial MRI revealed a tiny acute infarct in the right but not significant enough to explain patient's persistent cognitive and behavioral abnormalities.  Also noted was prior lacunar infarct right basal ganglia. ?-Cognitive screening revealed significant cognitive deficits; 4/5 repeat SLUMS improved to 24/30 but still with cognitive impairments. ?-Patient seen in consultation by psychiatry and medications adjusted accordingly per psych recommendations. ?-Patient noted to have been under IVC due to persistent wandering behaviors and threats to leave AMA. ?-Plan was to allow IVC to expire but continue sitter. ?-It is noted that patient's overall calmness and outlook have improved now that he is allowed to go off the unit with a sitter so he is not stuck in the room. ?-Due to wandering behaviors and threats again to leave [facility] psychiatry reconsulted and saw the patient and made some recommendations and medications adjusted accordingly. ?-Patient noted to have received another dose of Klonopin 1 mg on 05/04/2021 and on 05/07/2021 ?  ?  ?**Please note that sitter is being utilized in the acute hospital setting noting in the hospital setting we are unable to provide the same physical barriers as a locked unit and therefore for the patient's safety until patient is discharged he will need to require a sitter and a locked unit due to his recurrent wandering  behaviors which increase at night. ?  ?After discharge recommendations: PLEASE NOTE THAT THIS PT'S REGIMEN OF PSYCHOTROPIC MEDS CAN NEVER BE WEANED AND DC'D DUE TO SEVERITY OF HIS [place] psychiatry has reassessed patient who made medication changes with current regimen as noted from psychiatry below ?  ?-Continue klonopin 0.5mg  TID ?-Continue Prozac 40mg  ?-Continue Melatonin 10mg  QHS ?- Discontinued Trazodone 100mg  QHS due to patient complaint of unsteady gait and hangover effect, last dose 05/07/2021 ?-Continue Depakene 750 and 1000 mg.  Last Depakote level drawn on 04/23/2021 was 60. ?- Continue Risperdal 1mg  in the morning and increase Risperdal to 2 mg at bedtime 05/08/2021 ?-Continue Benadryl to 50 mg scheduled at bedtime to prevent EPS ?-Continue gabapentin 200 mg 3 times daily for neuropathic pain and anxiety ?-Continue ferrous sulfate 325 mg at bedtime for anemia and possible RLS symptoms ?-Psych recommends adding Risperdal 1 mg po after Lunch  ?  ? ?2.  Left knee pain ?-Noted to be a new finding primarily over the patella noted with palpation. ?-Plain films done negative. ? ?3.  Left lower extremity swelling ?-Patient noted to have some chronic swelling and pain in the left foot and ankle extending to the calf. ?-Lower extremity Dopplers negative for DVT. ?-Swelling improved. ?-Plain films negative for any acute fracture. ?-Patient was placed on a Lidoderm patch. ? ?4.  Witnessed seizure ?-Patient noted with prior history of seizures. ?-On 3/10 given  recent behavior changes attending felt EEG needed to be completed to rule out atypical seizure-like activity causing behavioral manifestations. ?-EEG which was done was unremarkable other than moderate diffuse encephalopathy with no seizure activity noted. ?-Patient seen by neurology. ?-Patient maintained on Keppra during the hospitalization with no further seizure episodes.  ?-Patient will need outpatient follow-up with neurology.   ? ?5.  Acute  kidney injury with rhabdomyolysis ?-Resolved. ?-Renal ultrasound done negative for hydronephrosis. ?-Creatinine peaked at 7.48 on 01/11/2022. ?-Patient was seen in consultation by nephrology initial plans was to

## 2021-05-14 NOTE — TOC Transition Note (Addendum)
Transition of Care (TOC) - CM/SW Discharge Note ? ? ?Patient Details  ?Name: Steve [name] ?MRN: [id number] ?Date of Birth: 08-30-1965 ? ?Transition of Care (TOC) CM/SW Contact:  ?Curlene Labrum, RN ?Phone Number: ?05/14/2021, 10:23 AM ? ? ?Clinical Narrative:    ?CM met with the patient at the bedside and he was notified of his admission today to [facility] in [place], [address] [place]. [place], [place] [address].  Transport packet will be sent with the patient to the facility to include hard prescriptions, facesheet, discharge summary and AVS discharge paperwork.  The patient will be transported to the facility by [employer] and the patient and son are aware. ? ?Discharge summary, prescriptions and facesheet was faxed to [place], SW at the [facility] facility at [email or web address].  Please call nursing report to [facility] at [phone]. ? ?I spoke with Berniece Salines, SW at the facility and she will follow up to be sure the patient has appointment set up for outpatient pscyhiatry and neurology MD.  Also, I asked that referral be made to the [facility] program for the patient's disability. ? ?CM and MSW with DTP Team will continue to follow the patient for discharge to the facility today. ? ?Final next level of care: [place] ?Barriers to Discharge: No Barriers Identified ? ? ?Patient Goals and CMS Choice ?Patient states their goals for this hospitalization and ongoing recovery are:: Patient wants to get better and leave the hospital. ?CMS Medicare.gov Compare Post Acute Care list provided to:: Patient ?Choice offered to / list presented to : Patient ? ?Discharge Placement ? [facility] facility at [facility] in [place], [place]  ?           ?  ?  ?  ?  ? ?Discharge Plan and Services ?In-house Referral: Clinical Social Work, Development worker, community ?Discharge Planning Services: CM Consult (Neurology and Psychiatry Follow up to be arranged by the  [facility] facility in [place], [place]) ?Post Acute Care Choice: [place] Nursing Home          ?  ?  ?  ?  ?  ?  ?  ?  ?  ?  ? ?Social Determinants of Health (SDOH) Interventions ?  ? ? ?Readmission Risk Interventions ?   ? View : No data to display.  ?  ?  ?  ? ? ? ? ? ?

## 2021-05-14 NOTE — Plan of Care (Signed)
  Problem: Health Behavior/Discharge Planning: Goal: Ability to manage health-related needs will improve Outcome: Progressing   Problem: Nutrition: Goal: Adequate nutrition will be maintained Outcome: Progressing   Problem: Coping: Goal: Level of anxiety will decrease Outcome: Progressing   Problem: Safety: Goal: Ability to remain free from injury will improve Outcome: Progressing   

## 2021-05-14 NOTE — Progress Notes (Signed)
Nursing Discharge Note ?  ?Admit Date: 01/05/2021 ? ?Discharge date: 05/14/2021 ?  ?Steve [name] is  to be discharged to a Skilled Nursing Facility per MD order.  ?AVS completed, placed in discharge packet for facility review. Discharge packet compiled for facility. Non-emergency transport arranged by CM/SW with TOC. Report called to Eligha Bridegroomeka T. RN  at [facility].  ? ?Allergies as of 05/14/2021   ?No Known Allergies ?  ? ?  ?Medication List  ?  ? ?STOP taking these medications   ? ?hydrOXYzine 50 MG tablet ?Commonly known as: ATARAX ?  ?traZODone 50 MG tablet ?Commonly known as: DESYREL ?  ? ?  ? ?TAKE these medications   ? ?acetaminophen 325 MG tablet ?Commonly known as: TYLENOL ?Take 2 tablets (650 mg total) by mouth every 4 (four) hours as needed for mild pain, fever or headache. ?  ?aspirin 325 MG tablet ?Take 1 tablet (325 mg total) by mouth daily. ?  ?clonazePAM 0.5 MG tablet ?Commonly known as: KLONOPIN ?Take 1 tablet (0.5 mg total) by mouth 3 (three) times daily. ?  ?diclofenac Sodium 1 % Gel ?Commonly known as: VOLTAREN ?Apply 2 g topically 4 (four) times daily. Apply to Left Foot ?  ?diphenhydrAMINE 50 MG capsule ?Commonly known as: BENADRYL ?Take 1 capsule (50 mg total) by mouth at bedtime. ?  ?docusate sodium 100 MG capsule ?Commonly known as: COLACE ?Take 1 capsule (100 mg total) by mouth 2 (two) times daily. ?  ?feeding supplement Liqd ?Take 237 mLs by mouth 4 (four) times daily -  before meals and at bedtime. ?  ?ferrous sulfate 325 (65 FE) MG tablet ?Take 1 tablet (325 mg total) by mouth at bedtime. ?  ?FLUoxetine 40 MG capsule ?Commonly known as: PROZAC ?Take 1 capsule (40 mg total) by mouth daily. ?What changed: medication strength ?  ?folic acid 1 MG tablet ?Commonly known as: FOLVITE ?Take 1 tablet (1 mg total) by mouth daily. ?  ?gabapentin 100 MG capsule ?Commonly known as: NEURONTIN ?Take 2 capsules (200 mg total) by mouth 3 (three) times daily. ?What changed:   ?medication strength ?how much to take ?  ?hydrocortisone cream 1 % ?Apply topically 3 (three) times daily. Apply to eczema areas on back, abdomen and extremities ?  ?levETIRAcetam 500 MG tablet ?Commonly known as: KEPPRA ?Take 1 tablet (500 mg total) by mouth 2 (two) times daily. ?  ?lidocaine 5 % ?Commonly known as: LIDODERM ?Place 1 patch onto the skin daily. Remove & Discard patch within 12 hours or as directed by MD ?  ?Melatonin 10 MG Tabs ?Take 10 mg by mouth at bedtime. ?  ?nicotine 21 mg/24hr patch ?Commonly known as: NICODERM CQ - dosed in mg/24 hours ?Place 1 patch (21 mg total) onto the skin daily. ?  ?ondansetron 4 MG disintegrating tablet ?Commonly known as: ZOFRAN-ODT ?Take 1 tablet (4 mg total) by mouth every 8 (eight) hours as needed for nausea or vomiting. ?  ?oxyCODONE HCl 7.5 MG Tabs ?Take 7.5 mg by mouth every 4 (four) hours as needed for moderate pain. ?  ?pantoprazole 40 MG tablet ?Commonly known as: PROTONIX ?Take 1 tablet (40 mg total) by mouth daily at 6 (six) AM. ?  ?polyethylene glycol 17 g packet ?Commonly known as: MIRALAX / GLYCOLAX ?Take 17 g by mouth daily. ?  ?polyvinyl alcohol 1.4 % ophthalmic solution ?Commonly known as: LIQUIFILM TEARS ?Place 1 drop into both eyes as needed for dry eyes. ?  ?QUEtiapine  100 MG tablet ?Commonly known as: SEROQUEL ?Take 1 tablet (100 mg total) by mouth 2 (two) times daily. ?  ?risperiDONE 1 MG disintegrating tablet ?Commonly known as: RISPERDAL M-TABS ?Take 1 tablet (1 mg total) by mouth 2 (two) times daily at 8 am and 1 pm AND 2 tablets (2 mg total) at bedtime. ?  ?rivaroxaban 10 MG Tabs tablet ?Commonly known as: XARELTO ?Take 1 tablet (10 mg total) by mouth daily. ?  ?thiamine 100 MG tablet ?Take 2.5 tablets (250 mg total) by mouth daily. ?  ?valproic acid 250 MG capsule ?Commonly known as: DEPAKENE ?Take 4 capsules (1,000 mg total) by mouth at bedtime AND 3 capsules (750 mg total) every morning. ?  ? ?  ? ?  ?  ? ? ?  ?Discharge Care  Instructions  ?(From admission, onward)  ?  ? ? ?  ? ?  Start     Ordered  ? 05/14/21 0000  Discharge wound care:       ?Comments: As above  ? 05/14/21 0932  ? 01/19/21 0000  Discharge wound care:       ? 01/19/21 1443  ? ?  ?  ? ?  ?  ? ?Discharge Instructions   ? ? Ambulatory referral to Neurology   Complete by: As directed ?  ? An appointment is requested in approximately: 4 weeks  ? Call MD for:  persistant dizziness or light-headedness   Complete by: As directed ?  ? Call MD for:  persistant nausea and vomiting   Complete by: As directed ?  ? Call MD for:  severe uncontrolled pain   Complete by: As directed ?  ? Call MD for:  temperature >100.4   Complete by: As directed ?  ? Diet - low sodium heart healthy   Complete by: As directed ?  ? Diet general   Complete by: As directed ?  ? Discharge instructions   Complete by: As directed ?  ? Seizure precautions: ?-Per [facility] statutes, patients with seizures are not allowed to drive until they have been seizure-free for six months.  ?-Use caution when using heavy equipment or power tools.  ?-Avoid working on ladders or at heights.  ?-Take showers instead of baths. Ensure the water temperature is not too high on the home water heater.  ?-Do not go swimming alone.  ?-Do not lock yourself in a room alone (i.e. bathroom). ?-When caring for infants or small children, sit down when holding, feeding, or changing them to minimize risk of injury to the child in the event you have a seizure.  ?-Maintain good sleep hygiene. ?-Avoid alcohol.  ? ? If patient has another seizure, call 911 and bring them back to the ED if: ?A.  The seizure lasts longer than 5 minutes.      ?B.  The patient doesn't wake shortly after the seizure or has new problems such as difficulty seeing, speaking or moving following the seizure ?C.  The patient was injured during the seizure ?D.  The patient has a temperature over 102 F (39C) ?E.  The patient vomited during the seizure and now is  having trouble breathing  ? Discharge wound care:   Complete by: As directed ?  ? Discharge wound care:   Complete by: As directed ?  ? As above  ? Increase activity slowly   Complete by: As directed ?  ? Increase activity slowly   Complete by: As directed ?  ? No  wound care   Complete by: As directed ?  ? ?  ?  ?[facility] is  to provide transportation to facility for patient. Non-emergency transport at bedside. Handoff completed with [facility] surgeon. Patient with home medication of gabapentin, provided to [employer] to handover to [facility]  ? ?Patient discharged from hospital unit via wheelchair. Escorted to [employer]. Stable at time of discharge.  ?  ?

## 2021-05-14 NOTE — Progress Notes (Signed)
CSW spoke with [facility] to confirm pick up time of 12pm. [facility] has the unit number to call when the driver is near by. No specific documentation required for transportation. Discharge paperwork will be compiled and given to patient at discharge. ? ?CSW spoke with Lajoyce Corners, RN to inform her of discharge plan. ? ?Edwin Dada, MSW, LCSW ?Transitions of Care  Clinical Social Worker II ?[phone] ? ?

## 2021-06-09 ENCOUNTER — Encounter: Payer: Self-pay | Admitting: Neurology

## 2021-06-09 ENCOUNTER — Ambulatory Visit: Payer: Medicaid Other | Admitting: Neurology
